# Patient Record
Sex: Female | Born: 1945
Health system: Southern US, Community
[De-identification: ages and names within clinical notes are randomized; demographics above are authoritative.]

## PROBLEM LIST (undated history)

## (undated) DIAGNOSIS — M272 Inflammatory conditions of jaws: Secondary | ICD-10-CM

## (undated) DIAGNOSIS — C4491 Basal cell carcinoma of skin, unspecified: Secondary | ICD-10-CM

## (undated) DIAGNOSIS — F329 Major depressive disorder, single episode, unspecified: Secondary | ICD-10-CM

## (undated) DIAGNOSIS — M549 Dorsalgia, unspecified: Secondary | ICD-10-CM

## (undated) DIAGNOSIS — J90 Pleural effusion, not elsewhere classified: Secondary | ICD-10-CM

## (undated) DIAGNOSIS — F32A Depression, unspecified: Secondary | ICD-10-CM

## (undated) DIAGNOSIS — R9389 Abnormal findings on diagnostic imaging of other specified body structures: Secondary | ICD-10-CM

## (undated) DIAGNOSIS — G8929 Other chronic pain: Secondary | ICD-10-CM

## (undated) DIAGNOSIS — H353290 Exudative age-related macular degeneration, unspecified eye, stage unspecified: Secondary | ICD-10-CM

## (undated) DIAGNOSIS — Z9289 Personal history of other medical treatment: Secondary | ICD-10-CM

## (undated) DIAGNOSIS — G894 Chronic pain syndrome: Secondary | ICD-10-CM

## (undated) DIAGNOSIS — K274 Chronic or unspecified peptic ulcer, site unspecified, with hemorrhage: Secondary | ICD-10-CM

## (undated) DIAGNOSIS — K284 Chronic or unspecified gastrojejunal ulcer with hemorrhage: Secondary | ICD-10-CM

## (undated) DIAGNOSIS — H543 Unqualified visual loss, both eyes: Secondary | ICD-10-CM

## (undated) DIAGNOSIS — K559 Vascular disorder of intestine, unspecified: Secondary | ICD-10-CM

## (undated) DIAGNOSIS — K631 Perforation of intestine (nontraumatic): Secondary | ICD-10-CM

## (undated) DIAGNOSIS — F419 Anxiety disorder, unspecified: Secondary | ICD-10-CM

## (undated) DIAGNOSIS — R000 Tachycardia, unspecified: Secondary | ICD-10-CM

## (undated) DIAGNOSIS — J019 Acute sinusitis, unspecified: Secondary | ICD-10-CM

## (undated) DIAGNOSIS — L659 Nonscarring hair loss, unspecified: Secondary | ICD-10-CM

## (undated) DIAGNOSIS — IMO0002 Reserved for concepts with insufficient information to code with codable children: Secondary | ICD-10-CM

## (undated) DIAGNOSIS — K551 Chronic vascular disorders of intestine: Secondary | ICD-10-CM

## (undated) DIAGNOSIS — D649 Anemia, unspecified: Secondary | ICD-10-CM

## (undated) HISTORY — DX: Inflammatory conditions of jaws: M27.2

## (undated) HISTORY — DX: Chronic vascular disorders of intestine: K55.1

## (undated) HISTORY — DX: Chronic pain syndrome: G89.4

## (undated) HISTORY — PX: VAGINAL HYSTERECTOMY: SUR661

## (undated) HISTORY — PX: FACIAL RECONSTRUCTION SURGERY: SHX631

## (undated) HISTORY — PX: CATARACT EXTRACTION W/ INTRAOCULAR LENS  IMPLANT, BILATERAL: SHX1307

## (undated) HISTORY — DX: Depression, unspecified: F32.A

## (undated) HISTORY — PX: OVARIAN CYST SURGERY: SHX726

## (undated) HISTORY — DX: Major depressive disorder, single episode, unspecified: F32.9

## (undated) HISTORY — PX: MOHS SURGERY: SUR867

## (undated) HISTORY — DX: Reserved for concepts with insufficient information to code with codable children: IMO0002

## (undated) HISTORY — PX: BOWEL RESECTION: SHX1257

---
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
## 1997-08-11 ENCOUNTER — Emergency Department (HOSPITAL_COMMUNITY): Admission: EM | Admit: 1997-08-11 | Discharge: 1997-08-11 | Payer: Self-pay | Admitting: Family Medicine

## 1997-11-28 ENCOUNTER — Encounter: Admission: RE | Admit: 1997-11-28 | Discharge: 1998-02-26 | Payer: Self-pay | Admitting: Family Medicine

## 1998-02-26 ENCOUNTER — Encounter: Admission: RE | Admit: 1998-02-26 | Discharge: 1998-05-27 | Payer: Self-pay | Admitting: Family Medicine

## 1998-11-05 ENCOUNTER — Encounter: Payer: Self-pay | Admitting: Emergency Medicine

## 1998-11-05 ENCOUNTER — Emergency Department (HOSPITAL_COMMUNITY): Admission: EM | Admit: 1998-11-05 | Discharge: 1998-11-05 | Payer: Self-pay | Admitting: Emergency Medicine

## 2001-08-08 ENCOUNTER — Emergency Department (HOSPITAL_COMMUNITY): Admission: EM | Admit: 2001-08-08 | Discharge: 2001-08-08 | Payer: Self-pay | Admitting: Emergency Medicine

## 2002-02-06 ENCOUNTER — Encounter: Payer: Self-pay | Admitting: Emergency Medicine

## 2002-02-06 ENCOUNTER — Emergency Department (HOSPITAL_COMMUNITY): Admission: EM | Admit: 2002-02-06 | Discharge: 2002-02-06 | Payer: Self-pay | Admitting: Emergency Medicine

## 2002-10-07 ENCOUNTER — Emergency Department (HOSPITAL_COMMUNITY): Admission: EM | Admit: 2002-10-07 | Discharge: 2002-10-07 | Payer: Self-pay | Admitting: Emergency Medicine

## 2002-10-07 ENCOUNTER — Encounter: Payer: Self-pay | Admitting: Emergency Medicine

## 2003-02-07 ENCOUNTER — Emergency Department (HOSPITAL_COMMUNITY): Admission: EM | Admit: 2003-02-07 | Discharge: 2003-02-07 | Payer: Self-pay | Admitting: Emergency Medicine

## 2003-03-28 ENCOUNTER — Encounter: Admission: RE | Admit: 2003-03-28 | Discharge: 2003-04-09 | Payer: Self-pay | Admitting: Orthopedic Surgery

## 2004-02-23 ENCOUNTER — Emergency Department (HOSPITAL_COMMUNITY): Admission: EM | Admit: 2004-02-23 | Discharge: 2004-02-24 | Payer: Self-pay | Admitting: *Deleted

## 2004-07-23 ENCOUNTER — Emergency Department (HOSPITAL_COMMUNITY): Admission: EM | Admit: 2004-07-23 | Discharge: 2004-07-23 | Payer: Self-pay | Admitting: Emergency Medicine

## 2006-03-22 ENCOUNTER — Emergency Department (HOSPITAL_COMMUNITY): Admission: EM | Admit: 2006-03-22 | Discharge: 2006-03-22 | Payer: Self-pay | Admitting: Emergency Medicine

## 2006-04-06 ENCOUNTER — Emergency Department (HOSPITAL_COMMUNITY): Admission: EM | Admit: 2006-04-06 | Discharge: 2006-04-06 | Payer: Self-pay | Admitting: Emergency Medicine

## 2006-10-11 ENCOUNTER — Ambulatory Visit: Payer: Self-pay | Admitting: Infectious Diseases

## 2006-10-11 ENCOUNTER — Inpatient Hospital Stay (HOSPITAL_COMMUNITY): Admission: EM | Admit: 2006-10-11 | Discharge: 2006-10-15 | Payer: Self-pay | Admitting: Emergency Medicine

## 2006-10-29 ENCOUNTER — Telehealth: Payer: Self-pay | Admitting: *Deleted

## 2006-11-03 ENCOUNTER — Encounter (INDEPENDENT_AMBULATORY_CARE_PROVIDER_SITE_OTHER): Payer: Self-pay | Admitting: Internal Medicine

## 2006-11-03 ENCOUNTER — Ambulatory Visit: Payer: Self-pay | Admitting: Internal Medicine

## 2006-11-03 DIAGNOSIS — E538 Deficiency of other specified B group vitamins: Secondary | ICD-10-CM

## 2006-11-03 LAB — CONVERTED CEMR LAB
HCT: 37.3 % (ref 36.0–46.0)
Hemoglobin: 12.3 g/dL (ref 12.0–15.0)
Iron: 32 ug/dL — ABNORMAL LOW (ref 42–145)
MCHC: 32.8 g/dL (ref 30.0–36.0)
MCV: 89.7 fL (ref 78.0–100.0)
Platelets: 320 10*3/uL (ref 150–400)
RBC: 4.16 M/uL (ref 3.87–5.11)
RDW: 15.3 % — ABNORMAL HIGH (ref 11.5–14.0)
Saturation Ratios: 10 % — ABNORMAL LOW (ref 20–55)
TIBC: 316 ug/dL (ref 250–470)
UIBC: 284 ug/dL
WBC: 5.9 10*3/uL (ref 4.0–10.5)

## 2006-11-27 DIAGNOSIS — K551 Chronic vascular disorders of intestine: Secondary | ICD-10-CM

## 2006-11-27 DIAGNOSIS — M272 Inflammatory conditions of jaws: Secondary | ICD-10-CM

## 2006-11-27 HISTORY — DX: Chronic vascular disorders of intestine: K55.1

## 2006-11-27 HISTORY — DX: Inflammatory conditions of jaws: M27.2

## 2006-12-02 ENCOUNTER — Ambulatory Visit: Payer: Self-pay | Admitting: Internal Medicine

## 2006-12-02 ENCOUNTER — Encounter (INDEPENDENT_AMBULATORY_CARE_PROVIDER_SITE_OTHER): Payer: Self-pay | Admitting: Internal Medicine

## 2006-12-02 DIAGNOSIS — Z87891 Personal history of nicotine dependence: Secondary | ICD-10-CM | POA: Insufficient documentation

## 2006-12-02 LAB — CONVERTED CEMR LAB
ALT: 11 units/L (ref 0–35)
AST: 20 units/L (ref 0–37)
Albumin: 3.7 g/dL (ref 3.5–5.2)
Alkaline Phosphatase: 91 units/L (ref 39–117)
BUN: 12 mg/dL (ref 6–23)
CO2: 25 meq/L (ref 19–32)
Calcium: 9.6 mg/dL (ref 8.4–10.5)
Chloride: 101 meq/L (ref 96–112)
Creatinine, Ser: 0.66 mg/dL (ref 0.40–1.20)
Glucose, Bld: 93 mg/dL (ref 70–99)
HCT: 42.9 % (ref 36.0–46.0)
Hemoglobin: 14.1 g/dL (ref 12.0–15.0)
MCHC: 32.9 g/dL (ref 30.0–36.0)
MCV: 86.3 fL (ref 78.0–100.0)
Platelets: 279 10*3/uL (ref 150–400)
Potassium: 4.2 meq/L (ref 3.5–5.3)
RBC: 4.97 M/uL (ref 3.87–5.11)
RDW: 15.1 % — ABNORMAL HIGH (ref 11.5–14.0)
Sodium: 138 meq/L (ref 135–145)
Total Bilirubin: 0.4 mg/dL (ref 0.3–1.2)
Total Protein: 6.2 g/dL (ref 6.0–8.3)
WBC: 5.1 10*3/uL (ref 4.0–10.5)

## 2006-12-06 ENCOUNTER — Emergency Department (HOSPITAL_COMMUNITY): Admission: EM | Admit: 2006-12-06 | Discharge: 2006-12-07 | Payer: Self-pay | Admitting: Emergency Medicine

## 2006-12-09 ENCOUNTER — Inpatient Hospital Stay (HOSPITAL_COMMUNITY): Admission: AD | Admit: 2006-12-09 | Discharge: 2006-12-21 | Payer: Self-pay | Admitting: Infectious Disease

## 2006-12-09 ENCOUNTER — Ambulatory Visit: Payer: Self-pay | Admitting: Infectious Disease

## 2006-12-09 ENCOUNTER — Ambulatory Visit: Payer: Self-pay | Admitting: Internal Medicine

## 2006-12-27 ENCOUNTER — Encounter (INDEPENDENT_AMBULATORY_CARE_PROVIDER_SITE_OTHER): Payer: Self-pay | Admitting: Internal Medicine

## 2006-12-28 ENCOUNTER — Ambulatory Visit: Payer: Self-pay | Admitting: Internal Medicine

## 2006-12-28 DIAGNOSIS — K260 Acute duodenal ulcer with hemorrhage: Secondary | ICD-10-CM

## 2006-12-29 ENCOUNTER — Telehealth (INDEPENDENT_AMBULATORY_CARE_PROVIDER_SITE_OTHER): Payer: Self-pay | Admitting: Internal Medicine

## 2006-12-31 ENCOUNTER — Ambulatory Visit: Payer: Self-pay | Admitting: Hospitalist

## 2007-01-27 HISTORY — PX: INCISION AND DRAINAGE ABSCESS: SHX5864

## 2007-02-01 ENCOUNTER — Ambulatory Visit: Payer: Self-pay | Admitting: Infectious Disease

## 2007-02-01 ENCOUNTER — Encounter (INDEPENDENT_AMBULATORY_CARE_PROVIDER_SITE_OTHER): Payer: Self-pay | Admitting: Internal Medicine

## 2007-02-01 LAB — CONVERTED CEMR LAB
Bilirubin Urine: NEGATIVE
Hemoglobin, Urine: NEGATIVE
Ketones, ur: NEGATIVE mg/dL
Leukocytes, UA: NEGATIVE
Urine Glucose: NEGATIVE mg/dL
pH: 5.5 (ref 5.0–8.0)

## 2007-02-02 ENCOUNTER — Encounter (INDEPENDENT_AMBULATORY_CARE_PROVIDER_SITE_OTHER): Payer: Self-pay | Admitting: Internal Medicine

## 2007-02-08 ENCOUNTER — Encounter (INDEPENDENT_AMBULATORY_CARE_PROVIDER_SITE_OTHER): Payer: Self-pay | Admitting: Infectious Diseases

## 2007-02-08 ENCOUNTER — Ambulatory Visit: Payer: Self-pay | Admitting: Internal Medicine

## 2007-02-08 ENCOUNTER — Telehealth: Payer: Self-pay | Admitting: *Deleted

## 2007-02-08 LAB — CONVERTED CEMR LAB
Bilirubin Urine: NEGATIVE
Ketones, ur: NEGATIVE mg/dL
Nitrite: NEGATIVE
Specific Gravity, Urine: 1.014 (ref 1.005–1.03)
Urine Glucose: NEGATIVE mg/dL
pH: 5 (ref 5.0–8.0)

## 2007-02-11 ENCOUNTER — Encounter (INDEPENDENT_AMBULATORY_CARE_PROVIDER_SITE_OTHER): Payer: Self-pay | Admitting: Internal Medicine

## 2007-02-18 ENCOUNTER — Encounter (INDEPENDENT_AMBULATORY_CARE_PROVIDER_SITE_OTHER): Payer: Self-pay | Admitting: Internal Medicine

## 2007-02-22 ENCOUNTER — Telehealth: Payer: Self-pay | Admitting: *Deleted

## 2007-02-22 ENCOUNTER — Telehealth (INDEPENDENT_AMBULATORY_CARE_PROVIDER_SITE_OTHER): Payer: Self-pay | Admitting: Infectious Diseases

## 2007-02-24 ENCOUNTER — Ambulatory Visit (HOSPITAL_COMMUNITY): Admission: RE | Admit: 2007-02-24 | Discharge: 2007-02-24 | Payer: Self-pay | Admitting: Obstetrics & Gynecology

## 2007-02-24 ENCOUNTER — Encounter (INDEPENDENT_AMBULATORY_CARE_PROVIDER_SITE_OTHER): Payer: Self-pay | Admitting: Internal Medicine

## 2007-03-03 ENCOUNTER — Encounter (INDEPENDENT_AMBULATORY_CARE_PROVIDER_SITE_OTHER): Payer: Self-pay | Admitting: Internal Medicine

## 2007-03-03 ENCOUNTER — Telehealth: Payer: Self-pay | Admitting: *Deleted

## 2007-03-03 DIAGNOSIS — M810 Age-related osteoporosis without current pathological fracture: Secondary | ICD-10-CM

## 2007-03-30 ENCOUNTER — Telehealth: Payer: Self-pay | Admitting: *Deleted

## 2007-03-31 ENCOUNTER — Encounter (INDEPENDENT_AMBULATORY_CARE_PROVIDER_SITE_OTHER): Payer: Self-pay | Admitting: *Deleted

## 2007-03-31 ENCOUNTER — Ambulatory Visit: Payer: Self-pay | Admitting: Infectious Diseases

## 2007-03-31 LAB — CONVERTED CEMR LAB
Glucose, Bld: 99 mg/dL (ref 70–99)
Potassium: 4.2 meq/L (ref 3.5–5.3)
Sodium: 141 meq/L (ref 135–145)

## 2007-04-01 ENCOUNTER — Ambulatory Visit (HOSPITAL_COMMUNITY): Admission: RE | Admit: 2007-04-01 | Discharge: 2007-04-01 | Payer: Self-pay | Admitting: Hospitalist

## 2007-04-05 ENCOUNTER — Encounter (INDEPENDENT_AMBULATORY_CARE_PROVIDER_SITE_OTHER): Payer: Self-pay | Admitting: Internal Medicine

## 2007-04-12 ENCOUNTER — Ambulatory Visit: Payer: Self-pay | Admitting: Dentistry

## 2007-04-12 ENCOUNTER — Encounter: Admission: EM | Admit: 2007-04-12 | Discharge: 2007-04-12 | Payer: Self-pay | Admitting: Dentistry

## 2007-04-13 ENCOUNTER — Ambulatory Visit: Payer: Self-pay | Admitting: Hospitalist

## 2007-04-15 ENCOUNTER — Encounter (HOSPITAL_COMMUNITY): Payer: Self-pay | Admitting: Dentistry

## 2007-04-15 ENCOUNTER — Ambulatory Visit (HOSPITAL_COMMUNITY): Admission: RE | Admit: 2007-04-15 | Discharge: 2007-04-15 | Payer: Self-pay | Admitting: Dentistry

## 2007-04-15 ENCOUNTER — Ambulatory Visit: Payer: Self-pay | Admitting: Hospitalist

## 2007-05-09 ENCOUNTER — Encounter (INDEPENDENT_AMBULATORY_CARE_PROVIDER_SITE_OTHER): Payer: Self-pay | Admitting: Internal Medicine

## 2007-05-17 ENCOUNTER — Encounter (INDEPENDENT_AMBULATORY_CARE_PROVIDER_SITE_OTHER): Payer: Self-pay | Admitting: Internal Medicine

## 2007-05-19 ENCOUNTER — Encounter (INDEPENDENT_AMBULATORY_CARE_PROVIDER_SITE_OTHER): Payer: Self-pay | Admitting: Internal Medicine

## 2007-05-19 ENCOUNTER — Ambulatory Visit: Payer: Self-pay | Admitting: Internal Medicine

## 2007-05-19 LAB — CONVERTED CEMR LAB
Eosinophils Absolute: 0.1 10*3/uL (ref 0.0–0.7)
Eosinophils Relative: 1 % (ref 0–5)
HCT: 47.2 % — ABNORMAL HIGH (ref 36.0–46.0)
Lymphs Abs: 2.5 10*3/uL (ref 0.7–4.0)
MCV: 91.5 fL (ref 78.0–100.0)
Monocytes Relative: 6 % (ref 3–12)
Platelets: 208 10*3/uL (ref 150–400)
WBC: 8.9 10*3/uL (ref 4.0–10.5)

## 2007-05-20 ENCOUNTER — Encounter (HOSPITAL_COMMUNITY): Admission: RE | Admit: 2007-05-20 | Discharge: 2007-08-18 | Payer: Self-pay | Admitting: Infectious Disease

## 2007-05-30 ENCOUNTER — Ambulatory Visit: Payer: Self-pay | Admitting: Dentistry

## 2007-05-31 ENCOUNTER — Encounter (INDEPENDENT_AMBULATORY_CARE_PROVIDER_SITE_OTHER): Payer: Self-pay | Admitting: Internal Medicine

## 2007-05-31 ENCOUNTER — Ambulatory Visit (HOSPITAL_COMMUNITY): Admission: RE | Admit: 2007-05-31 | Discharge: 2007-05-31 | Payer: Self-pay | Admitting: Internal Medicine

## 2007-07-04 ENCOUNTER — Ambulatory Visit (HOSPITAL_COMMUNITY): Admission: RE | Admit: 2007-07-04 | Discharge: 2007-07-04 | Payer: Self-pay | Admitting: *Deleted

## 2007-07-04 ENCOUNTER — Ambulatory Visit: Payer: Self-pay | Admitting: Internal Medicine

## 2007-07-05 ENCOUNTER — Encounter (INDEPENDENT_AMBULATORY_CARE_PROVIDER_SITE_OTHER): Payer: Self-pay | Admitting: Internal Medicine

## 2007-07-13 ENCOUNTER — Ambulatory Visit: Payer: Self-pay | Admitting: Internal Medicine

## 2007-07-26 ENCOUNTER — Ambulatory Visit: Payer: Self-pay | Admitting: Internal Medicine

## 2007-07-28 ENCOUNTER — Encounter (INDEPENDENT_AMBULATORY_CARE_PROVIDER_SITE_OTHER): Payer: Self-pay | Admitting: *Deleted

## 2007-07-28 ENCOUNTER — Ambulatory Visit: Payer: Self-pay | Admitting: *Deleted

## 2007-07-28 ENCOUNTER — Ambulatory Visit (HOSPITAL_COMMUNITY): Admission: RE | Admit: 2007-07-28 | Discharge: 2007-07-28 | Payer: Self-pay | Admitting: Internal Medicine

## 2007-07-28 LAB — CONVERTED CEMR LAB
BUN: 9 mg/dL (ref 6–23)
Calcium: 9.1 mg/dL (ref 8.4–10.5)
Creatinine, Ser: 0.81 mg/dL (ref 0.40–1.20)
Glucose, Bld: 100 mg/dL — ABNORMAL HIGH (ref 70–99)

## 2007-08-10 ENCOUNTER — Ambulatory Visit: Payer: Self-pay | Admitting: Infectious Diseases

## 2007-08-11 ENCOUNTER — Ambulatory Visit: Payer: Self-pay | Admitting: Infectious Diseases

## 2007-08-11 LAB — CONVERTED CEMR LAB: TSH: 1.904 microintl units/mL (ref 0.350–4.50)

## 2007-08-15 ENCOUNTER — Encounter: Admission: RE | Admit: 2007-08-15 | Discharge: 2007-08-15 | Payer: Self-pay | Admitting: Gastroenterology

## 2007-08-15 ENCOUNTER — Encounter (INDEPENDENT_AMBULATORY_CARE_PROVIDER_SITE_OTHER): Payer: Self-pay | Admitting: Internal Medicine

## 2007-08-23 ENCOUNTER — Ambulatory Visit: Payer: Self-pay | Admitting: Dentistry

## 2007-09-14 ENCOUNTER — Telehealth (INDEPENDENT_AMBULATORY_CARE_PROVIDER_SITE_OTHER): Payer: Self-pay | Admitting: Internal Medicine

## 2007-10-14 ENCOUNTER — Telehealth (INDEPENDENT_AMBULATORY_CARE_PROVIDER_SITE_OTHER): Payer: Self-pay | Admitting: Internal Medicine

## 2007-10-14 ENCOUNTER — Emergency Department (HOSPITAL_COMMUNITY): Admission: EM | Admit: 2007-10-14 | Discharge: 2007-10-14 | Payer: Self-pay | Admitting: Family Medicine

## 2007-10-26 ENCOUNTER — Ambulatory Visit: Payer: Self-pay | Admitting: *Deleted

## 2007-10-26 DIAGNOSIS — M25559 Pain in unspecified hip: Secondary | ICD-10-CM

## 2007-10-29 ENCOUNTER — Inpatient Hospital Stay (HOSPITAL_COMMUNITY): Admission: EM | Admit: 2007-10-29 | Discharge: 2007-11-03 | Payer: Self-pay | Admitting: Emergency Medicine

## 2007-10-30 ENCOUNTER — Encounter (INDEPENDENT_AMBULATORY_CARE_PROVIDER_SITE_OTHER): Payer: Self-pay | Admitting: Gastroenterology

## 2007-10-31 ENCOUNTER — Encounter (INDEPENDENT_AMBULATORY_CARE_PROVIDER_SITE_OTHER): Payer: Self-pay | Admitting: Internal Medicine

## 2007-11-14 ENCOUNTER — Encounter: Payer: Self-pay | Admitting: Infectious Diseases

## 2007-11-15 ENCOUNTER — Ambulatory Visit: Payer: Self-pay | Admitting: Infectious Diseases

## 2007-11-15 LAB — CONVERTED CEMR LAB
ALT: 9 units/L (ref 0–35)
AST: 14 units/L (ref 0–37)
Albumin: 4.2 g/dL (ref 3.5–5.2)
Calcium: 9.7 mg/dL (ref 8.4–10.5)
Chloride: 101 meq/L (ref 96–112)
Platelets: 442 10*3/uL — ABNORMAL HIGH (ref 150–400)
Potassium: 5.2 meq/L (ref 3.5–5.3)
RDW: 14.5 % (ref 11.5–15.5)

## 2007-11-28 ENCOUNTER — Telehealth (INDEPENDENT_AMBULATORY_CARE_PROVIDER_SITE_OTHER): Payer: Self-pay | Admitting: Internal Medicine

## 2007-11-30 ENCOUNTER — Ambulatory Visit: Payer: Self-pay | Admitting: Dentistry

## 2007-12-01 ENCOUNTER — Ambulatory Visit: Payer: Self-pay | Admitting: Internal Medicine

## 2007-12-01 DIAGNOSIS — M26629 Arthralgia of temporomandibular joint, unspecified side: Secondary | ICD-10-CM

## 2007-12-26 ENCOUNTER — Encounter (INDEPENDENT_AMBULATORY_CARE_PROVIDER_SITE_OTHER): Payer: Self-pay | Admitting: Internal Medicine

## 2007-12-29 ENCOUNTER — Ambulatory Visit: Payer: Self-pay | Admitting: Infectious Diseases

## 2008-01-24 ENCOUNTER — Encounter (INDEPENDENT_AMBULATORY_CARE_PROVIDER_SITE_OTHER): Payer: Self-pay | Admitting: Internal Medicine

## 2008-01-31 ENCOUNTER — Emergency Department (HOSPITAL_COMMUNITY): Admission: EM | Admit: 2008-01-31 | Discharge: 2008-01-31 | Payer: Self-pay | Admitting: Emergency Medicine

## 2008-01-31 ENCOUNTER — Telehealth: Payer: Self-pay | Admitting: *Deleted

## 2008-02-03 ENCOUNTER — Other Ambulatory Visit: Admission: RE | Admit: 2008-02-03 | Discharge: 2008-02-03 | Payer: Self-pay | Admitting: Infectious Disease

## 2008-02-03 ENCOUNTER — Encounter (INDEPENDENT_AMBULATORY_CARE_PROVIDER_SITE_OTHER): Payer: Self-pay | Admitting: Internal Medicine

## 2008-02-03 ENCOUNTER — Ambulatory Visit: Payer: Self-pay | Admitting: Infectious Disease

## 2008-02-06 ENCOUNTER — Encounter (INDEPENDENT_AMBULATORY_CARE_PROVIDER_SITE_OTHER): Payer: Self-pay | Admitting: Internal Medicine

## 2008-03-08 ENCOUNTER — Encounter (INDEPENDENT_AMBULATORY_CARE_PROVIDER_SITE_OTHER): Payer: Self-pay | Admitting: *Deleted

## 2008-03-08 ENCOUNTER — Ambulatory Visit: Payer: Self-pay | Admitting: Internal Medicine

## 2008-03-08 ENCOUNTER — Encounter (INDEPENDENT_AMBULATORY_CARE_PROVIDER_SITE_OTHER): Payer: Self-pay | Admitting: Internal Medicine

## 2008-03-08 DIAGNOSIS — F329 Major depressive disorder, single episode, unspecified: Secondary | ICD-10-CM | POA: Insufficient documentation

## 2008-03-08 LAB — CONVERTED CEMR LAB
ALT: 10 units/L (ref 0–35)
AST: 16 units/L (ref 0–37)
Basophils Absolute: 0 10*3/uL (ref 0.0–0.1)
Basophils Relative: 0 % (ref 0–1)
CO2: 22 meq/L (ref 19–32)
Eosinophils Relative: 1 % (ref 0–5)
HCT: 47.6 % — ABNORMAL HIGH (ref 36.0–46.0)
Lymphocytes Relative: 34 % (ref 12–46)
Neutro Abs: 4.1 10*3/uL (ref 1.7–7.7)
Platelets: 262 10*3/uL (ref 150–400)
RDW: 14.9 % (ref 11.5–15.5)
Sodium: 144 meq/L (ref 135–145)
TSH: 1.221 microintl units/mL (ref 0.350–4.50)
Total Bilirubin: 0.4 mg/dL (ref 0.3–1.2)
Total Protein: 7.1 g/dL (ref 6.0–8.3)

## 2008-04-03 ENCOUNTER — Telehealth (INDEPENDENT_AMBULATORY_CARE_PROVIDER_SITE_OTHER): Payer: Self-pay | Admitting: Internal Medicine

## 2008-04-05 ENCOUNTER — Ambulatory Visit: Payer: Self-pay | Admitting: Internal Medicine

## 2008-04-13 ENCOUNTER — Ambulatory Visit: Payer: Self-pay | Admitting: Internal Medicine

## 2008-05-08 ENCOUNTER — Telehealth (INDEPENDENT_AMBULATORY_CARE_PROVIDER_SITE_OTHER): Payer: Self-pay | Admitting: Internal Medicine

## 2008-05-11 ENCOUNTER — Encounter (INDEPENDENT_AMBULATORY_CARE_PROVIDER_SITE_OTHER): Payer: Self-pay | Admitting: Internal Medicine

## 2008-05-11 ENCOUNTER — Ambulatory Visit: Payer: Self-pay | Admitting: Internal Medicine

## 2008-05-11 LAB — CONVERTED CEMR LAB
Amphetamine Screen, Ur: NEGATIVE
Cocaine Metabolites: NEGATIVE
Marijuana Metabolite: NEGATIVE
Opiates: POSITIVE — AB
Phencyclidine (PCP): NEGATIVE

## 2008-05-25 ENCOUNTER — Encounter: Admission: RE | Admit: 2008-05-25 | Discharge: 2008-05-25 | Payer: Self-pay | Admitting: Gastroenterology

## 2008-05-28 ENCOUNTER — Ambulatory Visit: Payer: Self-pay | Admitting: Internal Medicine

## 2008-06-04 ENCOUNTER — Emergency Department (HOSPITAL_COMMUNITY): Admission: EM | Admit: 2008-06-04 | Discharge: 2008-06-05 | Payer: Self-pay | Admitting: Emergency Medicine

## 2008-06-05 ENCOUNTER — Telehealth (INDEPENDENT_AMBULATORY_CARE_PROVIDER_SITE_OTHER): Payer: Self-pay | Admitting: *Deleted

## 2008-06-07 ENCOUNTER — Telehealth: Payer: Self-pay | Admitting: *Deleted

## 2008-06-08 ENCOUNTER — Inpatient Hospital Stay (HOSPITAL_COMMUNITY): Admission: RE | Admit: 2008-06-08 | Discharge: 2008-06-10 | Payer: Self-pay | Admitting: Orthopedic Surgery

## 2008-06-14 ENCOUNTER — Encounter: Admission: RE | Admit: 2008-06-14 | Discharge: 2008-07-20 | Payer: Self-pay | Admitting: Orthopedic Surgery

## 2008-07-17 ENCOUNTER — Ambulatory Visit: Payer: Self-pay | Admitting: Dentistry

## 2008-07-25 ENCOUNTER — Encounter: Admission: RE | Admit: 2008-07-25 | Discharge: 2008-10-23 | Payer: Self-pay | Admitting: Orthopedic Surgery

## 2008-07-26 ENCOUNTER — Encounter: Payer: Self-pay | Admitting: Internal Medicine

## 2008-07-26 ENCOUNTER — Telehealth: Payer: Self-pay | Admitting: *Deleted

## 2008-07-26 ENCOUNTER — Ambulatory Visit: Payer: Self-pay | Admitting: Internal Medicine

## 2008-07-26 LAB — CONVERTED CEMR LAB
Albumin: 4.8 g/dL (ref 3.5–5.2)
BUN: 8 mg/dL (ref 6–23)
Basophils Absolute: 0 10*3/uL (ref 0.0–0.1)
CO2: 22 meq/L (ref 19–32)
CRP: 0.4 mg/dL (ref <0.6)
Calcium: 9.7 mg/dL (ref 8.4–10.5)
Chloride: 106 meq/L (ref 96–112)
Creatinine, Ser: 0.77 mg/dL (ref 0.40–1.20)
Eosinophils Relative: 1 % (ref 0–5)
Glucose, Bld: 85 mg/dL (ref 70–99)
HCT: 48.3 % — ABNORMAL HIGH (ref 36.0–46.0)
Hemoglobin: 16.4 g/dL — ABNORMAL HIGH (ref 12.0–15.0)
Lymphocytes Relative: 41 % (ref 12–46)
Lymphs Abs: 2.5 10*3/uL (ref 0.7–4.0)
Monocytes Absolute: 0.3 10*3/uL (ref 0.1–1.0)
Neutro Abs: 3.1 10*3/uL (ref 1.7–7.7)
Potassium: 4.2 meq/L (ref 3.5–5.3)
Sed Rate: 5 mm/hr (ref 0–22)
WBC: 5.9 10*3/uL (ref 4.0–10.5)

## 2008-07-27 ENCOUNTER — Telehealth: Payer: Self-pay | Admitting: Internal Medicine

## 2008-07-27 ENCOUNTER — Ambulatory Visit (HOSPITAL_COMMUNITY): Admission: RE | Admit: 2008-07-27 | Discharge: 2008-07-27 | Payer: Self-pay | Admitting: Internal Medicine

## 2008-08-04 ENCOUNTER — Ambulatory Visit (HOSPITAL_COMMUNITY): Admission: RE | Admit: 2008-08-04 | Discharge: 2008-08-04 | Payer: Self-pay | Admitting: Orthopedic Surgery

## 2008-08-10 ENCOUNTER — Ambulatory Visit: Payer: Self-pay | Admitting: Infectious Diseases

## 2008-08-10 ENCOUNTER — Encounter: Payer: Self-pay | Admitting: Internal Medicine

## 2008-08-10 DIAGNOSIS — S42309A Unspecified fracture of shaft of humerus, unspecified arm, initial encounter for closed fracture: Secondary | ICD-10-CM

## 2008-08-14 LAB — CONVERTED CEMR LAB: Sed Rate: 17 mm/hr (ref 0–22)

## 2008-08-16 ENCOUNTER — Ambulatory Visit: Payer: Self-pay | Admitting: Infectious Diseases

## 2008-08-30 ENCOUNTER — Telehealth (INDEPENDENT_AMBULATORY_CARE_PROVIDER_SITE_OTHER): Payer: Self-pay | Admitting: Internal Medicine

## 2008-09-03 ENCOUNTER — Emergency Department (HOSPITAL_COMMUNITY): Admission: EM | Admit: 2008-09-03 | Discharge: 2008-09-03 | Payer: Self-pay | Admitting: Emergency Medicine

## 2008-09-05 ENCOUNTER — Ambulatory Visit (HOSPITAL_BASED_OUTPATIENT_CLINIC_OR_DEPARTMENT_OTHER): Admission: RE | Admit: 2008-09-05 | Discharge: 2008-09-05 | Payer: Self-pay | Admitting: Orthopedic Surgery

## 2008-09-10 ENCOUNTER — Ambulatory Visit: Payer: Self-pay | Admitting: Internal Medicine

## 2008-09-21 ENCOUNTER — Telehealth (INDEPENDENT_AMBULATORY_CARE_PROVIDER_SITE_OTHER): Payer: Self-pay | Admitting: Internal Medicine

## 2008-10-02 ENCOUNTER — Telehealth (INDEPENDENT_AMBULATORY_CARE_PROVIDER_SITE_OTHER): Payer: Self-pay | Admitting: Internal Medicine

## 2008-10-03 ENCOUNTER — Encounter: Payer: Self-pay | Admitting: Infectious Diseases

## 2008-10-04 ENCOUNTER — Telehealth: Payer: Self-pay | Admitting: *Deleted

## 2008-10-25 ENCOUNTER — Telehealth: Payer: Self-pay | Admitting: *Deleted

## 2008-10-25 ENCOUNTER — Ambulatory Visit: Payer: Self-pay | Admitting: Internal Medicine

## 2008-10-25 ENCOUNTER — Encounter (INDEPENDENT_AMBULATORY_CARE_PROVIDER_SITE_OTHER): Payer: Self-pay | Admitting: Internal Medicine

## 2008-10-26 ENCOUNTER — Encounter (INDEPENDENT_AMBULATORY_CARE_PROVIDER_SITE_OTHER): Payer: Self-pay | Admitting: Internal Medicine

## 2008-10-26 LAB — CONVERTED CEMR LAB
Cholesterol: 307 mg/dL — ABNORMAL HIGH (ref 0–200)
LDL Cholesterol: 205 mg/dL — ABNORMAL HIGH (ref 0–99)
Triglycerides: 132 mg/dL (ref <150)
VLDL: 26 mg/dL (ref 0–40)

## 2008-10-29 ENCOUNTER — Encounter (INDEPENDENT_AMBULATORY_CARE_PROVIDER_SITE_OTHER): Payer: Self-pay | Admitting: Internal Medicine

## 2008-10-30 ENCOUNTER — Telehealth (INDEPENDENT_AMBULATORY_CARE_PROVIDER_SITE_OTHER): Payer: Self-pay | Admitting: Internal Medicine

## 2008-11-02 ENCOUNTER — Telehealth (INDEPENDENT_AMBULATORY_CARE_PROVIDER_SITE_OTHER): Payer: Self-pay | Admitting: Internal Medicine

## 2008-11-05 ENCOUNTER — Telehealth (INDEPENDENT_AMBULATORY_CARE_PROVIDER_SITE_OTHER): Payer: Self-pay | Admitting: Internal Medicine

## 2008-11-06 ENCOUNTER — Encounter (INDEPENDENT_AMBULATORY_CARE_PROVIDER_SITE_OTHER): Payer: Self-pay | Admitting: Internal Medicine

## 2008-11-21 ENCOUNTER — Encounter (INDEPENDENT_AMBULATORY_CARE_PROVIDER_SITE_OTHER): Payer: Self-pay | Admitting: Internal Medicine

## 2008-11-21 ENCOUNTER — Telehealth (INDEPENDENT_AMBULATORY_CARE_PROVIDER_SITE_OTHER): Payer: Self-pay | Admitting: Internal Medicine

## 2008-12-04 ENCOUNTER — Ambulatory Visit: Payer: Self-pay | Admitting: Infectious Diseases

## 2008-12-27 ENCOUNTER — Telehealth (INDEPENDENT_AMBULATORY_CARE_PROVIDER_SITE_OTHER): Payer: Self-pay | Admitting: Internal Medicine

## 2008-12-27 ENCOUNTER — Encounter (INDEPENDENT_AMBULATORY_CARE_PROVIDER_SITE_OTHER): Payer: Self-pay | Admitting: Internal Medicine

## 2009-01-03 ENCOUNTER — Telehealth: Payer: Self-pay | Admitting: Infectious Diseases

## 2009-01-03 ENCOUNTER — Ambulatory Visit: Payer: Self-pay | Admitting: Infectious Diseases

## 2009-01-03 DIAGNOSIS — Z79891 Long term (current) use of opiate analgesic: Secondary | ICD-10-CM

## 2009-01-24 ENCOUNTER — Ambulatory Visit: Payer: Self-pay | Admitting: Internal Medicine

## 2009-01-24 DIAGNOSIS — E785 Hyperlipidemia, unspecified: Secondary | ICD-10-CM | POA: Insufficient documentation

## 2009-02-20 ENCOUNTER — Encounter
Admission: RE | Admit: 2009-02-20 | Discharge: 2009-03-08 | Payer: Self-pay | Admitting: Physical Medicine & Rehabilitation

## 2009-02-22 ENCOUNTER — Ambulatory Visit: Payer: Self-pay | Admitting: Physical Medicine & Rehabilitation

## 2009-02-25 ENCOUNTER — Ambulatory Visit: Payer: Self-pay | Admitting: Internal Medicine

## 2009-02-25 LAB — CONVERTED CEMR LAB
ALT: 15 units/L (ref 0–35)
AST: 22 units/L (ref 0–37)
Albumin: 4.1 g/dL (ref 3.5–5.2)
CO2: 24 meq/L (ref 19–32)
Calcium: 8.8 mg/dL (ref 8.4–10.5)
Chloride: 106 meq/L (ref 96–112)
Creatinine, Ser: 0.81 mg/dL (ref 0.40–1.20)
Potassium: 4.4 meq/L (ref 3.5–5.3)

## 2009-02-26 ENCOUNTER — Encounter (INDEPENDENT_AMBULATORY_CARE_PROVIDER_SITE_OTHER): Payer: Self-pay | Admitting: Internal Medicine

## 2009-03-04 ENCOUNTER — Encounter: Admission: AD | Admit: 2009-03-04 | Discharge: 2009-03-04 | Payer: Self-pay | Admitting: Dentistry

## 2009-03-08 ENCOUNTER — Ambulatory Visit: Payer: Self-pay | Admitting: Physical Medicine & Rehabilitation

## 2009-03-18 ENCOUNTER — Ambulatory Visit: Payer: Self-pay | Admitting: Dentistry

## 2009-03-21 ENCOUNTER — Ambulatory Visit: Payer: Self-pay | Admitting: Internal Medicine

## 2009-03-21 ENCOUNTER — Encounter: Admission: RE | Admit: 2009-03-21 | Discharge: 2009-03-21 | Payer: Self-pay | Admitting: Gastroenterology

## 2009-03-22 ENCOUNTER — Telehealth: Payer: Self-pay | Admitting: *Deleted

## 2009-03-29 ENCOUNTER — Ambulatory Visit: Payer: Self-pay | Admitting: Internal Medicine

## 2009-03-29 DIAGNOSIS — B029 Zoster without complications: Secondary | ICD-10-CM | POA: Insufficient documentation

## 2009-04-01 LAB — CONVERTED CEMR LAB
Cholesterol: 240 mg/dL — ABNORMAL HIGH (ref 0–200)
Free T4: 0.92 ng/dL (ref 0.80–1.80)
TSH: 2.876 microintl units/mL (ref 0.350–4.5)

## 2009-04-05 ENCOUNTER — Ambulatory Visit: Payer: Self-pay | Admitting: Infectious Diseases

## 2009-05-06 ENCOUNTER — Telehealth (INDEPENDENT_AMBULATORY_CARE_PROVIDER_SITE_OTHER): Payer: Self-pay | Admitting: Internal Medicine

## 2009-05-07 ENCOUNTER — Encounter (INDEPENDENT_AMBULATORY_CARE_PROVIDER_SITE_OTHER): Payer: Self-pay | Admitting: Internal Medicine

## 2009-06-10 ENCOUNTER — Telehealth: Payer: Self-pay | Admitting: *Deleted

## 2009-06-13 ENCOUNTER — Encounter (INDEPENDENT_AMBULATORY_CARE_PROVIDER_SITE_OTHER): Payer: Self-pay | Admitting: Internal Medicine

## 2009-06-18 ENCOUNTER — Encounter: Admission: RE | Admit: 2009-06-18 | Discharge: 2009-06-18 | Payer: Self-pay | Admitting: Internal Medicine

## 2009-07-15 ENCOUNTER — Telehealth (INDEPENDENT_AMBULATORY_CARE_PROVIDER_SITE_OTHER): Payer: Self-pay | Admitting: Internal Medicine

## 2009-07-15 ENCOUNTER — Encounter (INDEPENDENT_AMBULATORY_CARE_PROVIDER_SITE_OTHER): Payer: Self-pay | Admitting: Internal Medicine

## 2009-08-14 ENCOUNTER — Telehealth (INDEPENDENT_AMBULATORY_CARE_PROVIDER_SITE_OTHER): Payer: Self-pay | Admitting: *Deleted

## 2009-08-14 ENCOUNTER — Encounter: Payer: Self-pay | Admitting: Internal Medicine

## 2009-09-11 ENCOUNTER — Ambulatory Visit: Payer: Self-pay | Admitting: Internal Medicine

## 2009-09-25 ENCOUNTER — Telehealth: Payer: Self-pay | Admitting: Ophthalmology

## 2009-10-11 ENCOUNTER — Telehealth (INDEPENDENT_AMBULATORY_CARE_PROVIDER_SITE_OTHER): Payer: Self-pay | Admitting: *Deleted

## 2009-10-14 ENCOUNTER — Encounter: Payer: Self-pay | Admitting: Internal Medicine

## 2009-10-14 ENCOUNTER — Telehealth: Payer: Self-pay | Admitting: *Deleted

## 2009-11-11 ENCOUNTER — Telehealth: Payer: Self-pay | Admitting: Internal Medicine

## 2009-11-12 ENCOUNTER — Encounter: Payer: Self-pay | Admitting: Internal Medicine

## 2009-11-12 ENCOUNTER — Telehealth (INDEPENDENT_AMBULATORY_CARE_PROVIDER_SITE_OTHER): Payer: Self-pay | Admitting: *Deleted

## 2009-11-18 ENCOUNTER — Encounter: Payer: Self-pay | Admitting: Ophthalmology

## 2009-11-28 ENCOUNTER — Telehealth (INDEPENDENT_AMBULATORY_CARE_PROVIDER_SITE_OTHER): Payer: Self-pay | Admitting: *Deleted

## 2009-11-28 ENCOUNTER — Ambulatory Visit: Payer: Self-pay | Admitting: Internal Medicine

## 2009-12-23 ENCOUNTER — Ambulatory Visit: Payer: Self-pay | Admitting: Internal Medicine

## 2010-01-03 ENCOUNTER — Emergency Department (HOSPITAL_COMMUNITY)
Admission: EM | Admit: 2010-01-03 | Discharge: 2010-01-03 | Payer: Self-pay | Source: Home / Self Care | Admitting: Emergency Medicine

## 2010-01-08 ENCOUNTER — Ambulatory Visit: Payer: Self-pay | Admitting: Internal Medicine

## 2010-01-29 ENCOUNTER — Ambulatory Visit: Admission: RE | Admit: 2010-01-29 | Discharge: 2010-01-29 | Payer: Self-pay | Source: Home / Self Care

## 2010-02-07 ENCOUNTER — Ambulatory Visit (HOSPITAL_COMMUNITY)
Admission: RE | Admit: 2010-02-07 | Discharge: 2010-02-07 | Payer: Self-pay | Source: Home / Self Care | Attending: Internal Medicine | Admitting: Internal Medicine

## 2010-02-13 ENCOUNTER — Telehealth: Payer: Self-pay | Admitting: Ophthalmology

## 2010-02-17 ENCOUNTER — Encounter: Payer: Self-pay | Admitting: Internal Medicine

## 2010-02-18 ENCOUNTER — Ambulatory Visit: Admission: RE | Admit: 2010-02-18 | Discharge: 2010-02-18 | Payer: Self-pay | Source: Home / Self Care

## 2010-02-18 LAB — CONVERTED CEMR LAB
Barbiturate Quant, Ur: NEGATIVE
Cocaine Metabolites: NEGATIVE
Creatinine,U: 88.9 mg/dL
Methadone: NEGATIVE
Opiates: POSITIVE — AB

## 2010-02-20 ENCOUNTER — Ambulatory Visit (HOSPITAL_COMMUNITY)
Admission: RE | Admit: 2010-02-20 | Discharge: 2010-02-20 | Payer: Self-pay | Source: Home / Self Care | Attending: Internal Medicine | Admitting: Internal Medicine

## 2010-02-27 NOTE — Progress Notes (Signed)
Summary: med refill/gp  Phone Note Refill Request Message from:  Fax from Pharmacy on October 11, 2009 10:45 AM  Refills Requested: Medication #1:  PROZAC 40 MG CAPS Take 1.5 tablet by mouth once a day   Last Refilled: 09/11/2009 Last appt. 09/11/09.   Method Requested: Electronic Initial call taken by: Chinita Pester RN,  October 11, 2009 10:46 AM    Prescriptions: PROZAC 40 MG CAPS (FLUOXETINE HCL) Take 1.5 tablet by mouth once a day  #45 x 3   Entered and Authorized by:   Zoila Shutter MD   Signed by:   Zoila Shutter MD on 10/11/2009   Method used:   Electronically to        [facility] [address] [phone]* (retail)       [address]       [address]       [place], [place]  [address]       Ph: [phone]       Fax: [phone]   RxID:   [id number]

## 2010-02-27 NOTE — Assessment & Plan Note (Signed)
Summary: 1WK F/U/POKHAREL/VS   Vital Signs:  Patient profile:   65 year old female Height:      64.4 inches (163.58 cm) Weight:      165.4 pounds (75.18 kg) BMI:     28.14 Temp:     98.6 degrees F oral Pulse rate:   102 / minute BP sitting:   121 / 70  (left arm)  Vitals Entered By: Chinita Pester RN (April 05, 2009 10:51 AM)  CC: F/U visit- for rash on right arm. Is Patient Diabetic? No Pain Assessment Patient in pain? yes     Location: right arm Intensity: 7 Type: stinging Onset of pain  Intermittent Nutritional Status BMI of 25 - 29 = overweight  Have you ever been in a relationship where you felt threatened, hurt or afraid?No   Does patient need assistance? Functional Status Self care Ambulation Normal   Primary Care Provider:  Jason Coop MD  CC:  F/U visit- for rash on right arm.Marland Kitchen  History of Present Illness: Patient is 65 year old female current smoker with PMH as described in EMR is here today for follow up of rash.. It started 4 weeks ago, first at the arm and progressed to forearms. It was associated with pain, she was prescribed lidocaine which helped some but then started irritating. She was here last week for increased pain, redness and swelling of her arm. We started her on doxycycline during that visit.  She comes in today with her arm healing well. Swelling, pain and redness almost gone now. She still has some scattered rashes which are not painful. She also has noticed 3-4 lesions on her face but they are not painful. She complains of Flu like symptoms going on for 2 weeks now with yellow colour sputum. No fever or chills.  Depression History:      The patient denies a depressed mood most of the day and a diminished interest in her usual daily activities.         Preventive Screening-Counseling & Management  Alcohol-Tobacco     Alcohol drinks/day: 0     Smoking Status: current     Smoking Cessation Counseling: yes     Packs/Day: 0.5  Year Quit: 2009, 1 month  Caffeine-Diet-Exercise     Caffeine use/day: 0     Does Patient Exercise: yes     Type of exercise: WALKING THE DOG     Times/week: 7  Problems Prior to Update: 1)  Herpes Zoster  (ICD-053.9) 2)  Oth&uns Sup Injr Oth Mx&uns Site w/o Mention Inf  (ICD-919.8) 3)  Hyperlipidemia  (ICD-272.4) 4)  Chronic Pain Syndrome  (ICD-338.4) 5)  Visual Acuity, Decreased, Left Eye  (ICD-369.9) 6)  Fracture, Arm, Right  (ICD-818.0) 7)  Cellulitis and Abscess of Face  (ICD-682.0) 8)  Fatigue  (ICD-780.79) 9)  Depression  (ICD-311) 10)  Toe Pain  (ICD-729.5) 11)  Carcinoma, Basal Cell, Cheek, Left  (ICD-173.3) 12)  External Hemorrhoids  (ICD-455.3) 13)  Temporomandibular Joint Pain  (ICD-524.62) 14)  Hip Pain  (ICD-719.45) 15)  Back Pain  (ICD-724.5) 16)  Compression Fracture, Thoracic Vertebra  (ICD-805.2) 17)  Low Blood Pressure  (ICD-458.9) 18)  Osteoporosis  (ICD-733.00) 19)  Health Maintenance Exam  (ICD-V70.0) 20)  Acute Osteomyelitis Other Specified Site  (ICD-730.08) 21)  Tobacco Abuse  (ICD-305.1) 22)  Vitamin B12 Deficiency  (ICD-266.2) 23)  Acute Duodenal Ulcer W/hemorrhage&obstruction  (ICD-532.01) 24)  Anemia  (ICD-285.9)  Medications Prior to Update: 1)  Protonix 40 Mg  Tbec (Pantoprazole Sodium) .... Take 1 Tablet By Mouth Two Times A Day 2)  Cobal-1000 1000 Mcg/ml Inj Soln (Cyanocobalamin) .... Inject Once Daily Once A Month 3)  Bd Luer-Lok Syringe 21g X 1-1/2" 3 Ml  Misc (Syringe/needle (Disp)) 4)  Oscal 500/200 D-3 500-200 Mg-Unit  Tabs (Calcium-Vitamin D) .... Take 3 Tablets A Day. 5)  Miralax  Pack (Polyethylene Glycol 3350) .... Take 1 Pack Once A Day. 6)  Prozac 40 Mg Caps (Fluoxetine Hcl) .... Take 1 Tablet By Mouth Once A Day 7)  Percocet 5-325 Mg Tabs (Oxycodone-Acetaminophen) .... Ta Ke 1 Tablet By Mouth Every 8 Hours As Needed For Pain. 8)  Pravachol 20 Mg Tabs (Pravastatin Sodium) .... Take 1 Tablet By Mouth Once A Day 9)  Neurontin 400 Mg  Caps (Gabapentin) .... Take 1 Pill By Mouth Three Times A Day. 10)  Nicoderm Cq 21 Mg/24hr Pt24 (Nicotine) .... Upply 1 Patch Locally Daily. 11)  Bentyl 20 Mg Tabs (Dicyclomine Hcl) .... Take 1 Tablet By Mouth Three To Four Times A Day, Per Dr. Ewing Schlein. 12)  Amitriptyline Hcl 50 Mg Tabs (Amitriptyline Hcl) .... Once Daily For 5 Days and Then Increase It To 2 Pills A Day From Day 6 Onwards. 13)  Doxepin Hcl 100 Mg Caps (Doxepin Hcl) .... Take 1 Tablet By Mouth Two Times A Day  Current Medications (verified): 1)  Protonix 40 Mg  Tbec (Pantoprazole Sodium) .... Take 1 Tablet By Mouth Two Times A Day 2)  Cobal-1000 1000 Mcg/ml Inj Soln (Cyanocobalamin) .... Inject Once Daily Once A Month 3)  Bd Luer-Lok Syringe 21g X 1-1/2" 3 Ml  Misc (Syringe/needle (Disp)) 4)  Oscal 500/200 D-3 500-200 Mg-Unit  Tabs (Calcium-Vitamin D) .... Take 3 Tablets A Day. 5)  Miralax  Pack (Polyethylene Glycol 3350) .... Take 1 Pack Once A Day. 6)  Prozac 40 Mg Caps (Fluoxetine Hcl) .... Take 1 Tablet By Mouth Once A Day 7)  Percocet 5-325 Mg Tabs (Oxycodone-Acetaminophen) .... Ta Ke 1 Tablet By Mouth Every 8 Hours As Needed For Pain. 8)  Pravachol 40 Mg Tabs (Pravastatin Sodium) .... Take 1 Tablet By Mouth Once A Day At Bedtime 9)  Neurontin 400 Mg Caps (Gabapentin) .... Take 1 Pill By Mouth Three Times A Day. 10)  Nicoderm Cq 21 Mg/24hr Pt24 (Nicotine) .... Upply 1 Patch Locally Daily. 11)  Bentyl 20 Mg Tabs (Dicyclomine Hcl) .... Take 1 Tablet By Mouth Three To Four Times A Day, Per Dr. Ewing Schlein. 12)  Amitriptyline Hcl 50 Mg Tabs (Amitriptyline Hcl) .... Once Daily For 5 Days and Then Increase It To 2 Pills A Day From Day 6 Onwards.  Allergies (verified): 1)  ! Nsaids 2)  ! 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 Ewing Schlein    - f/u by gen. surg.    - expecting her to require bowel resection (i.e. when the sx  recur) DUODENAL ULCER with hemorrhage and obstruction 11/2006 MANDIBULAR OSTEOMYELITIS with recurrent chin abscess    - started 11/2006 while in hospital. Gram showed GNR and cx was negative.    - cx negative x 2 additional times in opc    - s/p debridement by Dr. Neoma Laming    - Grew S. Bovis - s/p 4 weeks of Pen V started 05/19/07 and additional 2 weeks started 07/04/07 DEGENERATIVE DISC DISEASE L5-S1 CHRONIC INTERSCAPULAR PAIN    - Mild compression of  T12 superior endplate with a Schmorl's node - doc'd on CT scan 03/22/06 Depression  Family History: Last updated: 11-06-07 Father: died from an MI at 48yo, DM, Mother died of lung cancer Sister- DM  Social History: Last updated: 2007/11/06 Now lives by herself in an ALF where she can come and go as she pleases and where she can keep her dog, Ree Kida Criselda Peaches). Her good friend [name] is still looking out for her on a daily basis. Smokes about 4 cigarettes a week.   No EtOH   Risk Factors: Alcohol Use: 0 (04/05/2009) Caffeine Use: 0 (04/05/2009) Exercise: yes (04/05/2009)  Risk Factors: Smoking Status: current (04/05/2009) Packs/Day: 4 cigs/week (02/08/2007)  Social History: Packs/Day:  4 cigs/week  Review of Systems      See HPI  Physical Exam  Additional Exam:  Gen: AOx3, in no acute distress, few lesions on her face(4-5) Eyes: PERRL, EOMI ENT:MMM, No erythema noted in posterior pharynx Neck: No JVD, No LAP Chest: CTAB with  good respiratory effort CVS: regular rhythmic rate, NO M/R/G, S1 S2 normal Abdo: soft,ND, BS+x4, Non tender and No hepatosplenomegaly EXT: No odema noted, multiple maculopapular lesions arm in healing stage. Neuro: Non focal, gait is normal Skin: no rashes noted.    Impression & Recommendations:  Problem # 1:  HERPES ZOSTER (ICD-053.9) Assessment Improved Much improved. Plan to have her back as needed. The lesions seemed to be either healed compl;etely or just about to heal.  Problem # 2:   HYPERLIPIDEMIA (ICD-272.4) Assessment: Deteriorated  Change her dose to 40mg  as her chelestereol needs to be controlled better and we have scope to go up on pravachol as tolerated. Her updated medication list for this problem includes:    Pravachol 40 Mg Tabs (Pravastatin sodium) .Marland Kitchen... Take 1 tablet by mouth once a day at bedtime  Labs Reviewed: SGOT: 22 (02/25/2009)   SGPT: 15 (02/25/2009)   HDL:64 (03/29/2009), 76 (10/26/2008)  LDL:152 (03/29/2009), 205 (10/26/2008)  Chol:240 (03/29/2009), 307 (10/26/2008)  Trig:121 (03/29/2009), 132 (10/26/2008)  Problem # 3:  HEALTH MAINTENANCE EXAM (ICD-V70.0) Assessment: Comment Only Mammogram is scheduled and asked nursing to get the Colonoscopy report. Up to date on all her vaccinations.  Problem # 4:  URI (ICD-465.9) Assessment: New  Instructed on symptomatic and OTC treatment along with steam inhalation and saline gargles at home. Call if symptoms persist or worsen.   Problem # 5:  TOBACCO ABUSE (ICD-305.1) Assessment: Comment Only Patient agreed to fill her prescription today. Her updated medication list for this problem includes:    Nicoderm Cq 21 Mg/24hr Pt24 (Nicotine) ..... Upply 1 patch locally daily.  Encouraged smoking cessation and discussed different methods for smoking cessation. She agrees to fill her prescription today.  Complete Medication List: 1)  Protonix 40 Mg Tbec (Pantoprazole sodium) .... Take 1 tablet by mouth two times a day 2)  Cobal-1000 1000 Mcg/ml Inj Soln (Cyanocobalamin) .... Inject once daily once a month 3)  Bd Luer-lok Syringe 21g X 1-1/2" 3 Ml Misc (Syringe/needle (disp)) 4)  Oscal 500/200 D-3 500-200 Mg-unit Tabs (Calcium-vitamin d) .... Take 3 tablets a day. 5)  Miralax Pack (Polyethylene glycol 3350) .... Take 1 pack once a day. 6)  Prozac 40 Mg Caps (Fluoxetine hcl) .... Take 1 tablet by mouth once a day 7)  Percocet 5-325 Mg Tabs (Oxycodone-acetaminophen) .... Ta ke 1 tablet by mouth every 8 hours as  needed for pain. 8)  Pravachol 40 Mg Tabs (Pravastatin sodium) .... Take 1 tablet by mouth  once a day at bedtime 9)  Neurontin 400 Mg Caps (Gabapentin) .... Take 1 pill by mouth three times a day. 10)  Nicoderm Cq 21 Mg/24hr Pt24 (Nicotine) .... Upply 1 patch locally daily. 11)  Bentyl 20 Mg Tabs (Dicyclomine hcl) .... Take 1 tablet by mouth three to four times a day, per dr. Ewing Schlein. 12)  Amitriptyline Hcl 50 Mg Tabs (Amitriptyline hcl) .... Once daily for 5 days and then increase it to 2 pills a day from day 6 onwards.  Other Orders: Mammogram (Screening) (Mammo)  Patient Instructions: 1)  Please schedule a follow-up appointment as needed. 2)  Please schedule an appointment with your primary doctor . 3)  Tobacco is very bad for your health and your loved ones! You Should stop smoking!. 4)  Stop Smoking Tips: Choose a Quit date. Cut down before the Quit date. decide what you will do as a substitute when you feel the urge to smoke(gum,toothpick,exercise). 5)  It is important that you exercise regularly at least 20 minutes 5 times a week. If you develop chest pain, have severe difficulty breathing, or feel very tired , stop exercising immediately and seek medical attention. 6)  Schedule your mammogram. 7)  Check your Blood Pressure regularly. If it is above: 140/90 you should make an appointment. 8)  Get plenty of rest, drink lots of clear liquids, and use Tylenol or Ibuprofen for fever and comfort. Return in 7-10 days if you're not better:sooner if you're feeling worse. 9)  Take over the counter cough and cold medications only as directed on the package insert. DO NOT take more than recommended . Prescriptions: PRAVACHOL 40 MG TABS (PRAVASTATIN SODIUM) Take 1 tablet by mouth once a day at bedtime  #30 x 2   Entered and Authorized by:   Lars Mage MD   Signed by:   Lars Mage MD on 04/05/2009   Method used:   Electronically to        [facility] [address] [phone]* (retail)       [address]       [address]       [place], [place]  [address]       Ph: [phone]       Fax: [phone]   RxID:   [id number]   Prevention & Chronic Care Immunizations   Influenza vaccine: Fluvax MCR  (01/24/2009)   Influenza vaccine deferral: Deferred  (07/26/2008)   Influenza vaccine due: 09/26/2008    Tetanus booster: Not documented   Td booster deferral: Refused  (04/05/2009)    Pneumococcal vaccine: Not documented   Pneumococcal vaccine deferral: Not indicated  (03/29/2009)    H. zoster vaccine: Not documented   H. zoster vaccine deferral: Deferred  (07/26/2008)  Colorectal Screening   Hemoccult: Not documented   Hemoccult action/deferral: Not indicated  (04/05/2009)   Hemoccult due: 07/26/2009    Colonoscopy: Not documented   Colonoscopy action/deferral: Deferred  (04/05/2009)  Other Screening   Pap smear: Not documented   Pap smear action/deferral: Not indicated S/P hysterectomy  (09/10/2008)    Mammogram: Not documented   Mammogram action/deferral: Deferred  (04/05/2009)    DXA bone density scan: Not documented   DXA bone density action/deferral: Deferred  (04/05/2009)  Reports requested:   Last colonoscopy report requested.  Smoking status: current  (04/05/2009)   Smoking cessation counseling: yes  (04/05/2009)  Lipids   Total Cholesterol: 240  (03/29/2009)   Lipid panel action/deferral: Lipid Panel ordered   LDL: 152  (  03/29/2009)   LDL Direct: Not documented   HDL: 64  (03/29/2009)   Triglycerides: 121  (03/29/2009)    SGOT (AST): 22  (02/25/2009)   SGPT (ALT): 15  (02/25/2009)   Alkaline phosphatase: 100  (02/25/2009)   Total bilirubin: 0.3  (02/25/2009)    Lipid flowsheet reviewed?: Yes   Progress toward LDL goal: Improved  Self-Management Support :   Personal Goals (by the next clinic visit) :      Personal LDL goal: 100  (03/29/2009)    Patient will work on the following items until the next clinic visit to reach self-care  goals:     Medications and monitoring: take my medicines every day, bring all of my medications to every visit  (04/05/2009)     Eating: eat more vegetables, eat foods that are low in salt, eat baked foods instead of fried foods  (03/29/2009)     Activity: join a walking program  (03/29/2009)    Lipid self-management support: Education handout, Written self-care plan  (04/05/2009)   Lipid self-care plan printed.   Lipid education handout printed   Nursing Instructions: Request report of last colonoscopy Schedule screening mammogram (see order)    Colonoscopy report to be scanned.   Chinita Pester RN  April 05, 2009 12:03 PM

## 2010-02-27 NOTE — Progress Notes (Signed)
Summary: refill/ hla  Phone Note Refill Request Message from:  Patient on October 14, 2009 11:48 AM  Refills Requested: Medication #1:  PERCOCET 5-325 MG TABS Ta ke 1 tablet by mouth every 8 hours as needed for pain.   Dosage confirmed as above?Dosage Confirmed   Last Refilled: 8/17 last visit 8/17, labs 1 yr ago  Initial call taken by: Marin Roberts RN,  October 14, 2009 11:48 AM    Prescriptions: PERCOCET 5-325 MG TABS (OXYCODONE-ACETAMINOPHEN) Ta ke 1 tablet by mouth every 8 hours as needed for pain.  #60 x 0   Entered and Authorized by:   Zoila Shutter MD   Signed by:   Zoila Shutter MD on 10/14/2009   Method used:   Print then Give to Patient   RxID:   [id number]

## 2010-02-27 NOTE — Assessment & Plan Note (Signed)
Summary: EST-CK/FU/MEDS/CFB   Vital Signs:  Patient profile:   65 year old female Height:      64.4 inches (163.58 cm) Weight:      160.0 pounds (75.18 kg) BMI:     28.14 Temp:     98.4 degrees F (36.89 degrees C) oral Pulse rate:   98 / minute BP sitting:   130 / 72  (left arm) Cuff size:   regular  Vitals Entered By: Theotis Barrio NT II (September 11, 2009 2:59 PM) CC: MEDICATION REFILL    /  PAIN BACK - HIPS- KNEE # 6  /   DEPRESSED  - Is Patient Diabetic? No Pain Assessment Patient in pain? yes      Nutritional Status BMI of 25 - 29 = overweight  Have you ever been in a relationship where you felt threatened, hurt or afraid?No   Does patient need assistance? Functional Status Self care Ambulation Normal   Primary Care Provider:  Jason Coop MD  CC:  MEDICATION REFILL    /  PAIN BACK - HIPS- KNEE # 6  /   DEPRESSED  -.  History of Present Illness:  This is a 65 year old female with a history of depression, superior mesenteric artery ischemia 2008, and lumbar degenerative joint disease who present for routine follow up.  The patient's primary concern today is her depression which she states has been worses since January.  The patient has been sleeping 18 hours daily, and as had increased oral intake and anhedonia, though she denies any guilt, SI or HI. The patient was recently seen in March for shingles in her right arm which is continuing to improve but she continues to have post herpetic neuralgia symptoms and has to sleep with a stuffed animal under her arm.  The patient is also having problems with her back and hip pain.  She feels both are progressive and 6/10 in intensity.  The hip pain as been particularly bad over the last 6 months. We have no dexa scan here but she states that her bones were "chalk" on the last one.   The patient also fell about 1 month ago and landed on her knees and hit her chest into a book case.  She has continued to have pain in her right  breast since that time but denies any new lumps.  The patient also has a history of basal cell carcinoma on her left cheek which has recurred but she has not seen a dermatologist.           Preventive Screening-Counseling & Management  Alcohol-Tobacco     Alcohol drinks/day: 0     Smoking Status: current     Smoking Cessation Counseling: yes     Packs/Day: 0.5     Year Started: 1-2 cigs per month SOMETIME     Year Quit: 2009, 1 month  Caffeine-Diet-Exercise     Caffeine use/day: 0     Does Patient Exercise: yes     Type of exercise: WALKING THE DOG     Times/week: 7  Allergies: 1)  ! Nsaids 2)  ! 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 Ewing Schlein    - f/u by gen. surg.    - expecting her to require bowel resection (i.e. when the sx recur) DUODENAL ULCER with hemorrhage and obstruction 11/2006 MANDIBULAR OSTEOMYELITIS with recurrent chin abscess    -  started 11/2006 while in hospital. Gram showed GNR and cx was negative.    - cx negative x 2 additional times in opc    - s/p debridement by Dr. Neoma Laming    - Grew S. Bovis - s/p 4 weeks of Pen V started 05/19/07 and additional 2 weeks started 07/04/07 DEGENERATIVE DISC DISEASE L5-S1 CHRONIC INTERSCAPULAR PAIN    - Mild compression of T12 superior endplate with a Schmorl's node - doc'd on CT scan 03/22/06 Depression  Family History: Last updated: 2007/11/21 Father: died from an MI at 71yo, DM, Mother died of lung cancer Sister- DM  Risk Factors: Alcohol Use: 0 (09/11/2009) Caffeine Use: 0 (09/11/2009) Exercise: yes (09/11/2009)  Social History: Now lives by herself in an ALF ([facility]) where she can come and go as she pleases. a Her good friend [name] is still looking out for her on a daily basis. Smokes about 1.5 packs daily.   No EtOH   Review of Systems       No fever, weight loss, no night sweats, no  change in Bm, no blood or dark stool.  All other systems reviewed and were negative.   Physical Exam  General:  alert, well-developed, and well-nourished.   Head:  normocephalic, atraumatic, and there is a 1 cm nodule on the left cheek with a pearly appearance and central scab.  Eyes:  vision grossly intact, pupils equal, pupils round, and pupils reactive to light.   Ears:  R ear normal and L ear normal.   Nose:  no external deformity and no nasal discharge.   Mouth:  TMJ malallignment on bite. Patient has no teeth.  no gingival abnormalities.   Neck:  supple and full ROM.   Chest Wall:  no deformities.  no echymosis Breasts:  Deferred.  Lungs:  normal respiratory effort, normal breath sounds, no crackles, and no wheezes.   Heart:  normal rate, regular rhythm, no murmur, no gallop, and no rub.   Abdomen:  diffuse tenderness, normal bowel sounds, no distention, no masses, no guarding, and no rigidity.   Rectal:  Deferred Genitalia:  Deferred Msk:  Bicep stregth 4/5 on right due to pain in right shoulder.  Shoulder is tender to palpation posteriorly with decreased range of movment.  Pulses:  2+ radial, dp/pt pulses bilaterally.  Extremities:  NO edema.  Neurologic:  alert & oriented X3 and cranial nerves II-XII intact.   Skin:  Apparent recurrence of BCC on left cheek.  Resolving macular rash of the right arm from recent zoster infection.  Cervical Nodes:  NO cervical lymphadamopathy.  Psych:  Oriented X3, memory intact for recent and remote, normally interactive, and good eye contact.     Impression & Recommendations:  Problem # 1:  HERPES ZOSTER (ICD-053.9) Resolving with secondary post herpetic neuralgia.  Patient's Neurontin has been increased to 600mg  three times a day.  Problem # 2:  DEPRESSION (ICD-311)  Increase prozac to 60 mg daily from 40 mg daily.  Will call in two weeks to monitor for improvement and reevaluate in the office at 1 month.   The following medications were  removed from the medication list:    Amitriptyline Hcl 50 Mg Tabs (Amitriptyline hcl) ..... Once daily for 5 days and then increase it to 2 pills a day from day 6 onwards. Her updated medication list for this problem includes:    Prozac 40 Mg Caps (Fluoxetine hcl) .Marland Kitchen... Take 1.5 tablet by mouth once a day  Problem # 3:  CARCINOMA, BASAL CELL, CHEEK, LEFT (ICD-173.3) Dermatology referral was made to further evaluate.  Would consider excisional biopsy given hx.    Problem # 4:  HIP PAIN (ICD-719.45)  Will continue percocet at the current dose.  Will reassess pain at next visit.  Increased pain may be associated with worsening depression symptoms.    Her updated medication list for this problem includes:    Percocet 5-325 Mg Tabs (Oxycodone-acetaminophen) .Marland Kitchen... Ta ke 1 tablet by mouth every 8 hours as needed for pain.  Problem # 7:  Preventive Health Care (ICD-V70.0) Will perform  bimanual exam at next visit. Patient recieved her TDAP today.   Problem # 8:  Hx of CONTUSION OF BREAST (ICD-922.0) Will do breast exam at next visit. Carcinoma is unlikely given recent mammogram.  Pain is likely MSK.   Complete Medication List: 1)  Cobal-1000 1000 Mcg/ml Inj Soln (Cyanocobalamin) .... Inject once daily once a month 2)  Bd Luer-lok Syringe 21g X 1-1/2" 3 Ml Misc (Syringe/needle (disp)) 3)  Oscal 500/200 D-3 500-200 Mg-unit Tabs (Calcium-vitamin d) .... Take 3 tablets a day. 4)  Prozac 40 Mg Caps (Fluoxetine hcl) .... Take 1.5 tablet by mouth once a day 5)  Percocet 5-325 Mg Tabs (Oxycodone-acetaminophen) .... Ta ke 1 tablet by mouth every 8 hours as needed for pain. 6)  Pravachol 40 Mg Tabs (Pravastatin sodium) .... Take 1 tablet by mouth once a day at bedtime 7)  Neurontin 600 Mg Tabs (Gabapentin) .... Take 1 pill by mouth three times daily. 8)  Nexium 40 Mg Cpdr (Esomeprazole magnesium) .... Take one tab daily 9)  Cvs Stool Softener 100 Mg Caps (Docusate sodium) .... Once a day as needed for  constipation  Other Orders: Dermatology Referral (Derma) Tdap => 84yrs IM ([address]) State-TD Vaccine 7 yrs. & > IM (60454U) Admin 1st Vaccine (98119) Admin of Any Addtl Vaccine (14782) Admin 1st Vaccine (State) [phone]) Admin of Any Addtl Vaccine (State) [phone])  Patient Instructions: 1)  If depression worsens with increased medication dose, or you develop thoughts of suicide, please go to the ER or contact your doctor.  Prescriptions: PERCOCET 5-325 MG TABS (OXYCODONE-ACETAMINOPHEN) Ta ke 1 tablet by mouth every 8 hours as needed for pain.  #60 x 0   Entered and Authorized by:   Sinda Du MD   Signed by:   Sinda Du MD on 09/11/2009   Method used:   Print then Give to Patient   RxID:   [id number] PROZAC 40 MG CAPS (FLUOXETINE HCL) Take 1.5 tablet by mouth once a day  #45 x 0   Entered and Authorized by:   Sinda Du MD   Signed by:   Sinda Du MD on 09/11/2009   Method used:   Electronically to        [facility] [address] [phone]* (retail)       [address]       [address]       [place], [place]  [address]       Ph: [phone]       Fax: [phone]   RxID:   [id number] NEURONTIN 600 MG TABS (GABAPENTIN) Take 1 pill by mouth three times daily.  #90 x 1   Entered and Authorized by:   Sinda Du MD   Signed by:   Sinda Du MD on 09/11/2009   Method used:   Electronically to        [facility] [address] [phone]* (retail)  [address]       [address]       [place], [place]  [address]       Ph: [phone]       Fax: [phone]   RxID:   [id number]    Prevention & Chronic Care Immunizations   Influenza vaccine: Fluvax MCR  (01/24/2009)   Influenza vaccine deferral: Deferred  (07/26/2008)   Influenza vaccine due: 09/26/2008    Tetanus booster: 09/11/2009: Tdap (State)   Td booster deferral: Refused  (04/05/2009)    Pneumococcal vaccine: Not documented   Pneumococcal vaccine deferral: Not indicated  (03/29/2009)     H. zoster vaccine: Not documented   H. zoster vaccine deferral: Deferred  (07/26/2008)  Colorectal Screening   Hemoccult: Not documented   Hemoccult action/deferral: Not indicated  (04/05/2009)   Hemoccult due: 07/26/2009    Colonoscopy: Not documented   Colonoscopy action/deferral: Deferred  (04/05/2009)  Other Screening   Pap smear: Not documented   Pap smear action/deferral: Not indicated S/P hysterectomy  (09/10/2008)    Mammogram: ASSESSMENT: Negative - BI-RADS 1^MM DIGITAL SCREENING  (06/18/2009)   Mammogram action/deferral: Deferred  (04/05/2009)    DXA bone density scan: Not documented   DXA bone density action/deferral: Deferred  (04/05/2009)   Smoking status: current  (09/11/2009)   Smoking cessation counseling: yes  (09/11/2009)  Lipids   Total Cholesterol: 240  (03/29/2009)   Lipid panel action/deferral: Lipid Panel ordered   LDL: 152  (03/29/2009)   LDL Direct: Not documented   HDL: 64  (03/29/2009)   Triglycerides: 121  (03/29/2009)    SGOT (AST): 22  (02/25/2009)   SGPT (ALT): 15  (02/25/2009)   Alkaline phosphatase: 100  (02/25/2009)   Total bilirubin: 0.3  (02/25/2009)  Self-Management Support :   Personal Goals (by the next clinic visit) :      Personal LDL goal: 100  (03/29/2009)    Patient will work on the following items until the next clinic visit to reach self-care goals:     Medications and monitoring: take my medicines every day, weigh myself weekly  (09/11/2009)     Eating: eat foods that are low in salt, eat baked foods instead of fried foods, limit or avoid alcohol  (09/11/2009)     Activity: join a walking program  (09/11/2009)    Lipid self-management support: Education handout, Written self-care plan  (04/05/2009)     Tetanus/Td Vaccine    Vaccine Type: Tdap (State)    Site: left deltoid    Mfr: GlaxoSmithKline    Dose: 0.5 ml    Route: IM    Given by: Merrie Roof RN    Exp. Date: 04/20/2011    Lot #: [id number]    VIS  given: 12/14/06 version given September 11, 2009.   Appended Document: EST-CK/FU/MEDS/CFB Of note, although, the patient's depression symptoms have been worsening since January, at the visit today she reports the recent demise of her dog.  The patient had an extremly close relationship with her dog and this may be exacerbating her recent depression symptoms.

## 2010-02-27 NOTE — Consult Note (Signed)
Summary: [facility]  [facility]   Imported By: Louretta Parma 11/29/2009 15:34:45  _____________________________________________________________________  External Attachment:    Type:   Image     Comment:   External Document

## 2010-02-27 NOTE — Assessment & Plan Note (Signed)
Summary: EST-ROUTINE F/U VISIT/CH   Vital Signs:  Patient profile:   65 year old female Height:      64.4 inches (163.58 cm) Weight:      160.3 pounds (72.73 kg) BMI:     27.22 Temp:     97.0 degrees F (36.11 degrees C) oral Pulse rate:   94 / minute BP sitting:   105 / 67  (left arm) Cuff size:   regular  Vitals Entered By: Theotis Barrio NT II (November 28, 2009 1:42 PM) CC: PAIN ALL OVER /  FLU SHOT  /  ? LOW BLOOD SUGAR, Depression Is Patient Diabetic? No Pain Assessment Patient in pain? yes     Location: ALL OVER  Intensity:       7 Type: ALL Onset of pain  CHRONIC  Nutritional Status BMI of 25 - 29 = overweight  Have you ever been in a relationship where you felt threatened, hurt or afraid?No   Does patient need assistance? Functional Status Self care Ambulation Normal   Primary Care Noriko Macari:  Jason Coop MD  CC:  PAIN ALL OVER /  FLU SHOT  /  ? LOW BLOOD SUGAR and Depression.  History of Present Illness: This is a 65 year old woman with a history of depression, superior mesenteric artery ischemia 2008, and lumbar degenerative joint disease who present for routine follow up. At her last visit, the pts primary concern was her depression and her prozac was increased to 60 mg daily.  Pt was experiencing anhedonia, polyphagia and prolonged sleep needs.  Increase in prozac did not help.  Pt has been very tearful, and is spending 20 hours a day in bed. Pt only gets up to eat and use rest room. Pt not sure what causing depression  but states that she is afraid to hurt. Pt has been supplementing her pain control with goodies powders. Pt has pain in, back, right arm, knees, and left hip. Pt continues to have post herpetic neuralgia or right arm even though neurontin was increased to 600 mg three times a day at her last visit.   Pt also states that she is having issues with SOB when walking. SOB since increased cig smoking over last year. Now smoking 1 pack daily up from  3-4 cigs daily. Pt SOB is associated with coughing. Pt denies pain or palpitations.  Since her last visit, pt has gotten excision of her facial lesion questionable for BCC.  Pt is also concerned about a occasional feelings of hypoglycemia where she gets jittery and lightheaded. Pt checked CBG during that time and blood sugar was 51 during one episode and 72 during another.  Pt denies any, changes in her bm, changes in vision, fevers or chills.            Depression History:      The patient is having a depressed mood most of the day and has a diminished interest in her usual daily activities.        Comments:  FEELS DEPRESSED OVER THE DEATH OF HER DOG. NO SUICIDAL THOUGHT.   Preventive Screening-Counseling & Management  Alcohol-Tobacco     Alcohol drinks/day: 0     Smoking Status: current     Smoking Cessation Counseling: yes     Packs/Day: 1.0  Caffeine-Diet-Exercise     Caffeine use/day: 0     Does Patient Exercise: yes     Type of exercise: WALKING THE DOG     Times/week: 7  Problems Prior to Update: 1)  Hx of Contusion of Breast  (ICD-922.0) 2)  Hx of Carcinoma, Basal Cell  (ICD-173.9) 3)  Uri  (ICD-465.9) 4)  Herpes Zoster  (ICD-053.9) 5)  Oth&uns Sup Injr Oth Mx&uns Site w/o Mention Inf  (ICD-919.8) 6)  Hyperlipidemia  (ICD-272.4) 7)  Chronic Pain Syndrome  (ICD-338.4) 8)  Visual Acuity, Decreased, Left Eye  (ICD-369.9) 9)  Fracture, Arm, Right  (ICD-818.0) 10)  Cellulitis and Abscess of Face  (ICD-682.0) 11)  Fatigue  (ICD-780.79) 12)  Depression  (ICD-311) 13)  Toe Pain  (ICD-729.5) 14)  Carcinoma, Basal Cell, Cheek, Left  (ICD-173.3) 15)  External Hemorrhoids  (ICD-455.3) 16)  Temporomandibular Joint Pain  (ICD-524.62) 17)  Hip Pain  (ICD-719.45) 18)  Back Pain  (ICD-724.5) 19)  Compression Fracture, Thoracic Vertebra  (ICD-805.2) 20)  Low Blood Pressure  (ICD-458.9) 21)  Osteoporosis  (ICD-733.00) 22)  Health Maintenance Exam  (ICD-V70.0) 23)   Acute Osteomyelitis Other Specified Site  (ICD-730.08) 24)  Tobacco Abuse  (ICD-305.1) 25)  Vitamin B12 Deficiency  (ICD-266.2) 26)  Acute Duodenal Ulcer W/hemorrhage&obstruction  (ICD-532.01) 27)  Anemia  (ICD-285.9)  Allergies: 1)  ! Nsaids 2)  ! Ibuprofen  Family History: Reviewed history from 10/26/2007 and no changes required. Father: died from an MI at 62yo, DM, Mother died of lung cancer Sister- DM  Social History: Reviewed history from 09/11/2009 and no changes required. Now lives by herself in an ALF ([facility]) where she can come and go as she pleases. a Her good friend [name] is still looking out for her on a daily basis. Smokes about 1 pack  daily.   No EtOH   Packs/Day:  1.0  Review of Systems       Pt denies any fever, chest pain, chills, abdominal pain, change in her BM's or blood in stool.   Physical Exam  General:  alert, well-nourished, and well-hydrated.  alert, well-nourished, and well-hydrated.   Head:  normocephalic.  normocephalic.   Eyes:  vision grossly intact, pupils equal, pupils round, and pupils reactive to light.  vision grossly intact, pupils equal, pupils round, and pupils reactive to light.   Nose:  no external deformity and no nasal discharge.  no external deformity and no nasal discharge.   Neck:  supple. Lungs:  normal respiratory effort, normal breath sounds, no crackles, and no wheezes.  normal respiratory effort, normal breath sounds, no crackles, and no wheezes.   Heart:  normal rate, regular rhythm, no murmur, no gallop, and no rub.  normal rate, regular rhythm, no murmur, no gallop, and no rub.   Abdomen:  soft, non-tender, normal bowel sounds, and no distention.  soft, non-tender, normal bowel sounds, and no distention.   Msk:  Diffuse tenderness to palpation along the paraspinal muscles.  Full ROM of the arms, knees and hips with no overlying joint edmea or erythema.  Pulses:  2+ DP/PT pulses Neurologic:  cranial nerves II-XII  intact.  cranial nerves II-XII intact.   Skin:  Pt has bandage on face overlying biopsy site.  Pt instructed not to remove bandage until tomorrow.    Impression & Recommendations:  Problem # 1:  DEPRESSION (ICD-311) Pt continues to have significant symptoms of depression.  As a result I started Wellbutrin 100mg  tabs once daily and will likely taper up when I see her in three weeks.  Pt was instructed to try to get out of bed and remain active as this will help her mood.  The  patient has increased her smoking because of her depressive symptoms but understands the risks of this.  Pt was counseled regarding the importance of quitting.    The following medications were removed from the medication list:    Prozac 40 Mg Caps (Fluoxetine hcl) .Marland Kitchen... Take 1.5 tablet by mouth once a day Her updated medication list for this problem includes:    Wellbutrin 100 Mg Tabs (Bupropion hcl) .Marland Kitchen... Take 1 tablet by mouth once a day  Problem # 2:  CHRONIC PAIN SYNDROME (ICD-338.4) Pt has multiple organic causes of her pain, and pain has not been well controlled on her percocet.  Pt states that these begin to wear off after about 4 hours.  Because of this I am starting the patient on Oxycontin so as to provide more sustained relief.  I have instructed the patient not to use goodies powders as she has a hx of bleeding ulcers.  It is likely that the pts pain is contributing to her anxiety and depression.   Problem # 3:  Hx of CARCINOMA, BASAL CELL (ICD-173.9) This has been removed and is being followed by dermatology.   Problem # 4:  Preventive Health Care (ICD-V70.0) Pt recieved a flu shot today.   Problem # 5:  HERPES ZOSTER (ICD-053.9) Will continue to monitor and continue the patient's current dose of neurontin.  It is likely that the pts pain will be improved with the change in her narcotic regimen.  Problem # 6:  ? of COPD (ICD-496) Will do a therapeutic trial of albuterol for now.  Pt was instructed on the  use of an inhaler and informed that she should use it if she develops sob or wheezing.  May consider PFT's in the future.  Her updated medication list for this problem includes:    Proventil Hfa 108 (90 Base) Mcg/act Aers (Albuterol sulfate) .Marland Kitchen... 2 puffs as every 4-6 hours as needed for sob/wheeze  Problem # 7:  ? of HYPOGLYCEMIA, UNSPECIFIED (ICD-251.2) This is very unlikely to be pathologic.  Did check CBG today which was WNL.  Will continue to monitor for now.    Orders: T- Capillary Blood Glucose (16109)  Complete Medication List: 1)  Cobal-1000 1000 Mcg/ml Inj Soln (Cyanocobalamin) .... Inject once daily once a month 2)  Bd Luer-lok Syringe 21g X 1-1/2" 3 Ml Misc (Syringe/needle (disp)) 3)  Oscal 500/200 D-3 500-200 Mg-unit Tabs (Calcium-vitamin d) .... Take 3 tablets a day. 4)  Oxycontin 10 Mg Xr12h-tab (Oxycodone hcl) .... Take 1 tablet by mouth two times a day 5)  Pravachol 40 Mg Tabs (Pravastatin sodium) .... Take 1 tablet by mouth once a day at bedtime 6)  Neurontin 600 Mg Tabs (Gabapentin) .... Take 1 pill by mouth three times daily. 7)  Nexium 40 Mg Cpdr (Esomeprazole magnesium) .... Take one tab daily 8)  Cvs Stool Softener 100 Mg Caps (Docusate sodium) .... Once a day as needed for constipation 9)  Wellbutrin 100 Mg Tabs (Bupropion hcl) .... Take 1 tablet by mouth once a day 10)  Proventil Hfa 108 (90 Base) Mcg/act Aers (Albuterol sulfate) .... 2 puffs as every 4-6 hours as needed for sob/wheeze  Other Orders: Influenza Vaccine MCR (60454)  Patient Instructions: 1)  Please avoid goodies powders as they will increase your chances of bleeding from your stomach.  We are changing your medication for depression to wellbutrin.  If you note worsening of your depression, please go to the ED or call the clinic.  We are  also going to start an albuterol inhaler that you can use when you have shortness of breath.  I want to see you back in 3 weeks. Prescriptions: PROVENTIL HFA 108 (90  BASE) MCG/ACT AERS (ALBUTEROL SULFATE) 2 puffs as every 4-6 hours as needed for SOB/wheeze  #1 x 0   Entered and Authorized by:   Sinda Du MD   Signed by:   Sinda Du MD on 11/28/2009   Method used:   Print then Give to Patient   RxID:   [id number] OXYCONTIN 10 MG XR12H-TAB (OXYCODONE HCL) Take 1 tablet by mouth two times a day  #60 x 0   Entered and Authorized by:   Sinda Du MD   Signed by:   Sinda Du MD on 11/28/2009   Method used:   Handwritten   RxID:   [id number] WELLBUTRIN 100 MG TABS (BUPROPION HCL) Take 1 tablet by mouth once a day  #30 x 3   Entered and Authorized by:   Sinda Du MD   Signed by:   Sinda Du MD on 11/28/2009   Method used:   Print then Give to Patient   RxID:   [id number]    Orders Added: 1)  Est. Patient Level III [32202] 2)  T- Capillary Blood Glucose [82948] 3)  Influenza Vaccine MCR [00025]   Immunizations Administered:  Influenza Vaccine # 1:    Vaccine Type: Fluvax MCR    Site: left deltoid    Mfr: GlaxoSmithKline    Dose: 0.5 ml    Route: IM    Given by: Angelina Ok RN    Exp. Date: 07/26/2010    Lot #: [id number]    VIS given: 08/20/09 version given November 28, 2009.  Flu Vaccine Consent Questions:    Do you have a history of severe allergic reactions to this vaccine? no    Any prior history of allergic reactions to egg and/or gelatin? no    Do you have a sensitivity to the preservative Thimersol? no    Do you have a past history of Guillan-Barre Syndrome? no    Do you currently have an acute febrile illness? no    Have you ever had a severe reaction to latex? no    Vaccine information given and explained to patient? yes    Are you currently pregnant? no   Immunizations Administered:  Influenza Vaccine # 1:    Vaccine Type: Fluvax MCR    Site: left deltoid    Mfr: GlaxoSmithKline    Dose: 0.5 ml    Route: IM    Given by: Angelina Ok RN    Exp. Date: 07/26/2010    Lot #:  [id number]    VIS given: 08/20/09 version given November 28, 2009.   Prevention & Chronic Care Immunizations   Influenza vaccine: Fluvax MCR  (11/28/2009)   Influenza vaccine deferral: Deferred  (07/26/2008)   Influenza vaccine due: 09/26/2008    Tetanus booster: 09/11/2009: Tdap (State)   Td booster deferral: Refused  (04/05/2009)    Pneumococcal vaccine: Not documented   Pneumococcal vaccine deferral: Not indicated  (03/29/2009)    H. zoster vaccine: Not documented   H. zoster vaccine deferral: Deferred  (07/26/2008)  Colorectal Screening   Hemoccult: Not documented   Hemoccult action/deferral: Not indicated  (04/05/2009)   Hemoccult due: 07/26/2009    Colonoscopy: Not documented   Colonoscopy action/deferral: Deferred  (04/05/2009)  Other Screening   Pap smear: Not documented   Pap smear action/deferral: Not  indicated S/P hysterectomy  (09/10/2008)    Mammogram: ASSESSMENT: Negative - BI-RADS 1^MM DIGITAL SCREENING  (06/18/2009)   Mammogram action/deferral: Deferred  (04/05/2009)    DXA bone density scan: Not documented   DXA bone density action/deferral: Deferred  (04/05/2009)   Smoking status: current  (11/28/2009)   Smoking cessation counseling: yes  (11/28/2009)  Lipids   Total Cholesterol: 240  (03/29/2009)   Lipid panel action/deferral: Lipid Panel ordered   LDL: 152  (03/29/2009)   LDL Direct: Not documented   HDL: 64  (03/29/2009)   Triglycerides: 121  (03/29/2009)    SGOT (AST): 22  (02/25/2009)   SGPT (ALT): 15  (02/25/2009)   Alkaline phosphatase: 100  (02/25/2009)   Total bilirubin: 0.3  (02/25/2009)  Self-Management Support :   Personal Goals (by the next clinic visit) :      Personal LDL goal: 100  (03/29/2009)    Patient will work on the following items until the next clinic visit to reach self-care goals:     Medications and monitoring: take my medicines every day  (11/28/2009)     Eating: drink diet soda or water instead of juice or  soda, eat more vegetables, use fresh or frozen vegetables, eat baked foods instead of fried foods, eat fruit for snacks and desserts, limit or avoid alcohol  (11/28/2009)     Activity: join a walking program  (09/11/2009)    Lipid self-management support: Resources for patients handout  (11/28/2009)        Resource handout printed.

## 2010-02-27 NOTE — Progress Notes (Signed)
Summary: Refill/gh  Phone Note Refill Request Message from:  Patient on November 12, 2009 10:16 AM  Refills Requested: Medication #1:  PERCOCET 5-325 MG TABS Ta ke 1 tablet by mouth every 8 hours as needed for pain.  Method Requested: Pick up at Office Initial call taken by: Angelina Ok RN,  November 12, 2009 10:16 AM  Follow-up for Phone Call        Rx written Follow-up by: Mariea Stable MD,  November 12, 2009 11:04 AM    Prescriptions: PERCOCET 5-325 MG TABS (OXYCODONE-ACETAMINOPHEN) Ta ke 1 tablet by mouth every 8 hours as needed for pain.  #60 x 0   Entered and Authorized by:   Mariea Stable MD   Signed by:   Mariea Stable MD on 11/12/2009   Method used:   Print then Give to Patient   RxID:   [id number]   Appended Document: Refill/gh Percocet  Rx ready to be picked up; pt. was called.

## 2010-02-27 NOTE — Progress Notes (Signed)
Summary: refill/gg   Phone Note Refill Request  on February 13, 2010 11:29 AM  Refills Requested: Medication #1:  VICODIN ES 7.5-750 MG TABS Take one tab every 4 hours as neede for pain..   Last Refilled: 01/29/2010 Pt is out of meds.  Will you increase the # you give her?  She states some days she needs to take 4 a day.   Call pt when ready - [phone]   Method Requested: Telephone to Pharmacy Initial call taken by: Merrie Roof RN,  February 13, 2010 11:29 AM  Follow-up for Phone Call        I talked with Dr Cathey Endow and he wants vicodin 7.5/750 # 15 called into pharmacy.  Pt to be evaluated next week in clinic for pain evaluation before any more refills. Scheduled for appointment 1/24 Pt informed Follow-up by: Merrie Roof RN,  February 13, 2010 3:33 PM    Prescriptions: VICODIN ES 7.5-750 MG TABS (HYDROCODONE-ACETAMINOPHEN) Take one tab every 4 hours as neede for pain.  #15 x 0   Entered and Authorized by:   Sinda Du MD   Signed by:   Sinda Du MD on 02/13/2010   Method used:   Telephoned to ...       [facility] [phone]* (retail)       [address]       [address]       [place], [place]  [address]       Ph: [phone]       Fax: [phone]   RxID:   [id number]

## 2010-02-27 NOTE — Progress Notes (Signed)
Summary: new med/ hla  Phone Note Call from Patient   Summary of Call: pt says dr Daphene Calamity gave her dicyclomine 20mg  1tablet every 6-8 hrs ac Initial call taken by: Marin Roberts RN,  March 22, 2009 2:21 PM     Appended Document: new med/ hla    Clinical Lists Changes  Medications: Added new medication of BENTYL 20 MG TABS (DICYCLOMINE HCL) take 1 tablet by mouth three to four times a day, per Dr. Ewing Schlein.

## 2010-02-27 NOTE — Assessment & Plan Note (Signed)
Summary: F/U/POKHAREL/VS   Vital Signs:  Patient profile:   65 year old female Height:      64.4 inches (163.58 cm) Weight:      156.6 pounds (71.18 kg) BMI:     26.64 Temp:     97.0 degrees F (36.11 degrees C) oral Pulse rate:   77 / minute BP sitting:   103 / 52  (left arm) Cuff size:   regular  Vitals Entered By: Theotis Barrio NT II (February 25, 2009 9:49 AM) CC: PATIENT IS FOR MEDICATION REFILL  AND FOLLOW UP APPT Is Patient Diabetic? No Pain Assessment Patient in pain? no      Nutritional Status BMI of 25 - 29 = overweight  Have you ever been in a relationship where you felt threatened, hurt or afraid?No   Does patient need assistance? Functional Status Self care Ambulation Normal Comments PATIENT IS FOR MEDICATION REFILL AND FOLLOW UP APPT   Primary Care Provider:  Jason Coop MD  CC:  PATIENT IS FOR MEDICATION REFILL  AND FOLLOW UP APPT.  History of Present Illness: 65 yo woman with PMH as listed below who presents for regular fu. She was last seen 01-24-2009 by her PCP, Dr. Aleene Davidson.  Tobacco Abuse: She is still smoking  ~2 cigarettes/day. She says that she used to smoke several packs a day and that this is a dramatic reduction. She says that she smokes in part because of the pain she experiences and that after she has her appt at the pain clinic she hopes she will get better pain control and that she can stop completely.   Cataracts: Are bilateral and was to have surgery on the left eye before the right. Since her last visit she has had both of these performed and is doing well.   TMJ: The patient has been on Percocet for pain. She is to see a surgeon about this after she has eye surgery for cataracts. She is going to call her surgeon for this but tells me she just has not done so yet.   Chronic Pain: The patient was referred to a pain clinic last visit. She has an appointment February 11th.   Hyperlipidemia: The patient was started on  Pravastatin last visit.    Basal Cell Carcinoma: She has known basal cell carcinoma on her left cheek. She says that she is going to see her dermatologist.    Hx of osteo of the jaw: The patient says that she has been doing well and stopped and antibiotics  ~ 2 months ago. She says that she was on antibiotics for  ~2.5 years. The record shows that she stopped doxy and cipro on Dec 9th 2010.   Chin bleeding: She says that last night she started to have bleeding out of her chin. No clear drainage. No purulent drainage. She thinks that is started in the shower yesterday evening. She says that she has pain in this area today. She does not remember hitting or scratching this area but says it might be  possible that she scratched herself in the shower.   No fevers, chills, nausea, vomiting, diarrhea.     Preventive Screening-Counseling & Management  Alcohol-Tobacco     Alcohol drinks/day: 0     Smoking Status: current     Smoking Cessation Counseling: yes     Packs/Day: 5 cigs per day     Year Started: 1-2 cigs per month SOMETIME     Year Quit: 2009, 1 month  Caffeine-Diet-Exercise     Caffeine use/day: 0     Does Patient Exercise: yes     Type of exercise: WALKING THE DOG     Times/week: 7  Problems Prior to Update: 1)  Hyperlipidemia  (ICD-272.4) 2)  Chronic Pain Syndrome  (ICD-338.4) 3)  Visual Acuity, Decreased, Left Eye  (ICD-369.9) 4)  Viral Uri  (ICD-465.9) 5)  Fracture, Arm, Right  (ICD-818.0) 6)  Shoulder Pain, Left  (ICD-719.41) 7)  Cellulitis and Abscess of Face  (ICD-682.0) 8)  Fatigue  (ICD-780.79) 9)  Depression  (ICD-311) 10)  Toe Pain  (ICD-729.5) 11)  Carcinoma, Basal Cell, Cheek, Left  (ICD-173.3) 12)  External Hemorrhoids  (ICD-455.3) 13)  Temporomandibular Joint Pain  (ICD-524.62) 14)  Hip Pain  (ICD-719.45) 15)  Back Pain  (ICD-724.5) 16)  Compression Fracture, Thoracic Vertebra  (ICD-805.2) 17)  Low Blood Pressure  (ICD-458.9) 18)  Diarrhea   (ICD-787.91) 19)  Tachycardia  (ICD-785.0) 20)  Osteoporosis  (ICD-733.00) 21)  Dysuria  (ICD-788.1) 22)  Health Maintenance Exam  (ICD-V70.0) 23)  Acute Osteomyelitis Other Specified Site  (ICD-730.08) 24)  Tobacco Abuse  (ICD-305.1) 25)  Vitamin B12 Deficiency  (ICD-266.2) 26)  Helicobacter Pylori Infection  (ICD-041.86) 27)  Acute Duodenal Ulcer W/hemorrhage&obstruction  (ICD-532.01) 28)  Anemia  (ICD-285.9)  Current Medications (verified): 1)  Protonix 40 Mg  Tbec (Pantoprazole Sodium) .... Take 1 Tablet By Mouth Two Times A Day 2)  Cobal-1000 1000 Mcg/ml Inj Soln (Cyanocobalamin) .... Inject Once Daily Once A Month 3)  Bd Luer-Lok Syringe 21g X 1-1/2" 3 Ml  Misc (Syringe/needle (Disp)) 4)  Oscal 500/200 D-3 500-200 Mg-Unit  Tabs (Calcium-Vitamin D) .... Take 3 Tablets A Day. 5)  Anucort-Hc 25 Mg Supp (Hydrocortisone Acetate) .Marland Kitchen.. 1 Suppository Per Rectum 1-2 Times Per Day As Needed For Hemorrhoids 6)  Miralax  Pack (Polyethylene Glycol 3350) .... Take 1 Pack Once A Day. 7)  Evista 60 Mg Tabs (Raloxifene Hcl) .... Take 1 Tablet By Mouth Once A Day 8)  Prozac 40 Mg Caps (Fluoxetine Hcl) .... Take 1 Tablet By Mouth Once A Day 9)  Percocet 5-325 Mg Tabs (Oxycodone-Acetaminophen) .... Ta Ke 1 Tablet By Mouth Every 8 Hours As Needed For Pain. 10)  Flexeril 5 Mg Tabs (Cyclobenzaprine Hcl) .... Take 1 Tab By Mouth At Bedtime 11)  Zyrtec Allergy 10 Mg Caps (Cetirizine Hcl) .... Take 1 Pill By Mouth Daily For 7 Days 12)  Pravachol 20 Mg Tabs (Pravastatin Sodium) .... Take 1 Tablet By Mouth Once A Day 13)  Neurontin 300 Mg Caps (Gabapentin) .... Take 1 Pill By Mouth Three Times A Day.  Allergies (verified): 1)  ! Nsaids 2)  ! Ibuprofen  Social History: Packs/Day:  4 cigs/week  Review of Systems  The patient denies anorexia, fever, weight loss, weight gain, vision loss, decreased hearing, hoarseness, chest pain, syncope, dyspnea on exertion, peripheral edema, prolonged cough, headaches,  hemoptysis, abdominal pain, melena, hematochezia, severe indigestion/heartburn, hematuria, incontinence, genital sores, transient blindness, difficulty walking, unusual weight change, and enlarged lymph nodes.         see HPI  Physical Exam  General:  alert, well-developed, well-nourished, and well-hydrated.   Head:  normocephalic and atraumatic.   Eyes:  vision grossly intact, pupils equal, pupils round, and pupils reactive to light.   Ears:  no external deformities.   Nose:  no external deformity.   Lungs:  normal respiratory effort, no accessory muscle use, normal breath sounds, no crackles, and no wheezes.  Heart:  normal rate, regular rhythm, no murmur, no gallop, and no rub.   Abdomen:  soft, non-tender, and normal bowel sounds.   Msk:  Patient has scars and signs of multiple surgeries on right arm. No signs of infection. Has chronic pain in this area.  Neurologic:  alert & oriented X3, cranial nerves II-XII intact, and strength normal in all extremities.   Skin:  Patient has a plaque on left cheek that is known basal cell carcinoma.   The patient has a small excoriation on the chin. No drainage. Not currently bleeding. No mass or signs of skin infection in this area. Underlying bone not tender to palpation.  Cervical Nodes:  no anterior cervical adenopathy and no posterior cervical adenopathy.   Psych:  Oriented X3, memory intact for recent and remote, normally interactive, good eye contact, not anxious appearing, and not depressed appearing.     Impression & Recommendations:  Problem # 1:  HYPERLIPIDEMIA (ICD-272.4) Patient was started on statin last visit. Checking LFTs today.  Her updated medication list for this problem includes:    Pravachol 20 Mg Tabs (Pravastatin sodium) .Marland Kitchen... Take 1 tablet by mouth once a day  Orders: T-Comprehensive Metabolic Panel ([id number])  Problem # 2:  CHRONIC PAIN SYNDROME (ICD-338.4) Patient was referred to a pain clinic at last visit.    Problem # 3:  OTH&UNS SUP INJR OTH MX&UNS SITE W/O MENTION INF (ICD-919.8) Patient has what is clearly a small excoriation on her chin that is no longer bleeding and is starting to scab over. She admit that her skin is "thin" and that she can easily injur it despite the fact that she does not remember injuring it. She has have a history of osteo of the jaw: The patient says that she has been doing well and stopped and antibiotics  ~ 2 months ago. She says that she was on antibiotics for  ~2.5 years. The record shows that she stopped doxy and cipro on Dec 9th 2010. At first I was concerend from the history that perhaps this was the return of infection, but she has absolutely no signs of infection anywhere on exam. I think this is literally just a small skin excoriation that should heal without any problem. However, I have counseled her that she needs to monitor this closely given her history. I have explained that this should get better quickly and that it definitely should not get worse. If she has any increased pain, swelling, bleeding, drainage, etc., she needs to call the clinic immediately. She says that with her hx she definitely knows the signs of infection and that this feels distinctly different but that if she does have any problem she will let us know immediately.   Problem # 4:  CARCINOMA, BASAL CELL, CHEEK, LEFT (ICD-173.3) She is going to call her dermatologist to set up removal [most likely [facility]].  Problem # 5:  TEMPOROMANDIBULAR JOINT PAIN (ICD-524.62) She is calling her surgeon to set up a meeting about this as mentioned at last office visit.   Problem # 6:  TOBACCO ABUSE (ICD-305.1) She is still smoking as per HPI. She feels that part of her smoking is that it helps her relax with pain. Her hope is that once she establishes in the pain clinic she will get better pain relief and can stop smoking completely. Patient was counseled about the advserse health risks of smoking such as heart  disease, lung disease, stroke, and cancer. Patient was informed that the clinic is  willing and able to assist the patient in smoking cessation through counseling, medications, or smoking cessation aids such as nicotine patches or gums. The patient was encouraged  to contact the clinic anytime smoking cessation help is desired.   Complete Medication List: 1)  Protonix 40 Mg Tbec (Pantoprazole sodium) .... Take 1 tablet by mouth two times a day 2)  Cobal-1000 1000 Mcg/ml Inj Soln (Cyanocobalamin) .... Inject once daily once a month 3)  Bd Luer-lok Syringe 21g X 1-1/2" 3 Ml Misc (Syringe/needle (disp)) 4)  Oscal 500/200 D-3 500-200 Mg-unit Tabs (Calcium-vitamin d) .... Take 3 tablets a day. 5)  Anucort-hc 25 Mg Supp (Hydrocortisone acetate) .Marland Kitchen.. 1 suppository per rectum 1-2 times per day as needed for hemorrhoids 6)  Miralax Pack (Polyethylene glycol 3350) .... Take 1 pack once a day. 7)  Evista 60 Mg Tabs (Raloxifene hcl) .... Take 1 tablet by mouth once a day 8)  Prozac 40 Mg Caps (Fluoxetine hcl) .... Take 1 tablet by mouth once a day 9)  Percocet 5-325 Mg Tabs (Oxycodone-acetaminophen) .... Ta ke 1 tablet by mouth every 8 hours as needed for pain. 10)  Flexeril 5 Mg Tabs (Cyclobenzaprine hcl) .... Take 1 tab by mouth at bedtime 11)  Zyrtec Allergy 10 Mg Caps (Cetirizine hcl) .... Take 1 pill by mouth daily for 7 days 12)  Pravachol 20 Mg Tabs (Pravastatin sodium) .... Take 1 tablet by mouth once a day 13)  Neurontin 300 Mg Caps (Gabapentin) .... Take 1 pill by mouth three times a day.  Patient Instructions: 1)  Please schedule a followup appointment in 1 month to monitor your symptoms and to see how your chronic pain is going. 2)  Please call your dermatolgist regarding your basal cell carcinoma. Also call your surgeon to discuss your TMJ.  3)  Please keep your appointment at the pain clinic.  Prescriptions: PROZAC 40 MG CAPS (FLUOXETINE HCL) Take 1 tablet by mouth once a day  #31 x 5    Entered and Authorized by:   Aris Lot MD   Signed by:   Aris Lot MD on 02/25/2009   Method used:   Print then Give to Patient   RxID:   [id number] PERCOCET 5-325 MG TABS (OXYCODONE-ACETAMINOPHEN) Ta ke 1 tablet by mouth every 8 hours as needed for pain.  #60 x 0   Entered and Authorized by:   Aris Lot MD   Signed by:   Aris Lot MD on 02/25/2009   Method used:   Print then Give to Patient   RxID:   [id number]  Process Orders Check Orders Results:     [facility]: Check successful Tests Sent for requisitioning (February 26, 2009 7:32 AM):     02/25/2009: [facility] -- T-Comprehensive Metabolic Panel [phone] (signed)

## 2010-02-27 NOTE — Medication Information (Signed)
Summary: RX Folder  RX Folder   Imported By: Louretta Parma 08/15/2009 14:45:30  _____________________________________________________________________  External Attachment:    Type:   Image     Comment:   External Document

## 2010-02-27 NOTE — Letter (Signed)
Summary: OPERATIVE REPORT  OPERATIVE REPORT   Imported By: Margie Billet 04/24/2009 12:05:21  _____________________________________________________________________  External Attachment:    Type:   Image     Comment:   External Document  Appended Document: OPERATIVE REPORT This document is hardly readible.

## 2010-02-27 NOTE — Progress Notes (Signed)
Summary: refill/gg  Phone Note Refill Request  on November 11, 2009 2:35 PM  Refills Requested: Medication #1:  NEURONTIN 600 MG TABS Take 1 pill by mouth three times daily.   Last Refilled: 10/08/2009  Method Requested: Electronic Initial call taken by: Merrie Roof RN,  November 11, 2009 2:36 PM  Follow-up for Phone Call        Has Nov appt Follow-up by: Blanch Media MD,  November 11, 2009 2:58 PM    Prescriptions: NEURONTIN 600 MG TABS (GABAPENTIN) Take 1 pill by mouth three times daily.  #90 x 2   Entered and Authorized by:   Blanch Media MD   Signed by:   Blanch Media MD on 11/11/2009   Method used:   Electronically to        [facility] [address] [phone]* (retail)       [address]       [address]       [place], [place]  [address]       Ph: [phone]       Fax: [phone]   RxID:   [id number]

## 2010-02-27 NOTE — Assessment & Plan Note (Signed)
Summary: F/U/EST/VS   Vital Signs:  Patient profile:   65 year old female Height:      64.4 inches (163.58 cm) Weight:      159.8 pounds (71.18 kg) BMI:     26.64 Temp:     97.2 degrees F (36.22 degrees C) oral Pulse rate:   76 / minute BP sitting:   98 / 53  (left arm) Cuff size:   regular  Vitals Entered By: Theotis Barrio NT II (March 21, 2009 11:25 AM) CC: PATIENT IS HERE FOR  1 MONTH FOLLOW UP APPT / PAIN RIGHT ARM AND SHOULDER # 5,6  /  BACK PAIN 4/ MEDICATION REFILL Is Patient Diabetic? No Pain Assessment Patient in pain? yes     Location: BACK/ARM/SHOULDER Intensity: 4,5 Type: ALL Onset of pain  Chronic Nutritional Status BMI of 25 - 29 = overweight  Have you ever been in a relationship where you felt threatened, hurt or afraid?No   Does patient need assistance? Functional Status Self care Ambulation Normal Comments PATIENT IS HERE FOR 1 MONTH FOLLOW UP APPT /  PAIN IN RIGHT ARM AND SHOULDER  /  MEDICATION RERILLL   Primary Care Provider:  Jason Coop MD  CC:  PATIENT IS HERE FOR  1 MONTH FOLLOW UP APPT / PAIN RIGHT ARM AND SHOULDER # 5 and 6  /  BACK PAIN 4/ MEDICATION REFILL.  History of Present Illness: [name] Bell is a 65 yo lady with PMH as outlined in the EMR comes today for a f/u visit.    1. Chronic Pain: She went to pain clinic and her neurontin was increased to 400 mg three times a day, started on lidocaine cream. She now as little blisters and rash with itching, but before applying the lidocaine. She has started lidocaine and now she has no flaring of her rash. SHe is seeing Dr. Doroteo Bradford again in 4/11. She is getting her percocet from pain clinic.   2. TMJ dysfunction: She has not spoken with her surgeon about her TMJ pain.   3. Tobacco abuse: She is currntly smoking 5-6 ciggarettes a day. She smokes when her pain pills runs out. She wants to try to something to quit smoking.   4. HL: She started taking her pravastatin without any  problem.   5. Skin lesion: She is going to her dermatologist on the last week of April for looks like resection. It burns, hurts and is even sensitive to a gush of air.   6. Cold: She can not hear on the left side and she is hurting on her l ear. No discharge. No fever, chills, sore throat, but she has runny nose.   7. She broke some rash on her right eye 7 days ago, she had some vesicles and later it turned to scab.   Pt recently went to see Dr. Ewing Schlein and wants her labs to be faxed to her.   Depression History:      The patient denies a depressed mood most of the day and a diminished interest in her usual daily activities.         Preventive Screening-Counseling & Management  Alcohol-Tobacco     Alcohol drinks/day: 0     Smoking Status: current     Smoking Cessation Counseling: yes     Packs/Day: 5 cigs per day     Year Started: 1-2 cigs per month SOMETIME     Year Quit: 2009, 1 month  Caffeine-Diet-Exercise  Caffeine use/day: 0     Does Patient Exercise: yes     Type of exercise: WALKING THE DOG     Times/week: 7  Current Medications (verified): 1)  Protonix 40 Mg  Tbec (Pantoprazole Sodium) .... Take 1 Tablet By Mouth Two Times A Day 2)  Cobal-1000 1000 Mcg/ml Inj Soln (Cyanocobalamin) .... Inject Once Daily Once A Month 3)  Bd Luer-Lok Syringe 21g X 1-1/2" 3 Ml  Misc (Syringe/needle (Disp)) 4)  Oscal 500/200 D-3 500-200 Mg-Unit  Tabs (Calcium-Vitamin D) .... Take 3 Tablets A Day. 5)  Anucort-Hc 25 Mg Supp (Hydrocortisone Acetate) .Marland Kitchen.. 1 Suppository Per Rectum 1-2 Times Per Day As Needed For Hemorrhoids 6)  Miralax  Pack (Polyethylene Glycol 3350) .... Take 1 Pack Once A Day. 7)  Evista 60 Mg Tabs (Raloxifene Hcl) .... Take 1 Tablet By Mouth Once A Day 8)  Prozac 40 Mg Caps (Fluoxetine Hcl) .... Take 1 Tablet By Mouth Once A Day 9)  Percocet 5-325 Mg Tabs (Oxycodone-Acetaminophen) .... Ta Ke 1 Tablet By Mouth Every 8 Hours As Needed For Pain. 10)  Flexeril 5 Mg Tabs  (Cyclobenzaprine Hcl) .... Take 1 Tab By Mouth At Bedtime 11)  Zyrtec Allergy 10 Mg Caps (Cetirizine Hcl) .... Take 1 Pill By Mouth Daily For 7 Days 12)  Pravachol 20 Mg Tabs (Pravastatin Sodium) .... Take 1 Tablet By Mouth Once A Day 13)  Neurontin 400 Mg Caps (Gabapentin) .... Take 1 Pill By Mouth Three Times A Day. 14)  Nicoderm Cq 21 Mg/24hr Pt24 (Nicotine) .... Upply 1 Patch Locally Daily.  Allergies: 1)  ! Nsaids 2)  ! Ibuprofen  Review of Systems      See HPI  Physical Exam  General:  alert.   Ears:  R ear normal and L ear normal.   Mouth:  pharynx pink and moist, no erythema, no exudates, and no posterior lymphoid hypertrophy.   Lungs:  normal breath sounds, no crackles, and no wheezes.   Heart:  normal rate, regular rhythm, and no murmur.   Abdomen:  soft and non-tender.   Extremities:  trace left pedal edema and trace right pedal edema.   Neurologic:  alert & oriented X3.   Skin:  Left arm: There is a red healing rash on a dermatomal fashion with root close to mid back and spreading over right shoulder, upper and lower arm. No vesicles or discharge needed.    Impression & Recommendations:  Problem # 1:  HYPERLIPIDEMIA (ICD-272.4) Will check following. Cont same for now.  Her updated medication list for this problem includes:    Pravachol 20 Mg Tabs (Pravastatin sodium) .Marland Kitchen... Take 1 tablet by mouth once a day  Orders: T-Lipid Profile ([id number]) T-TSH [phone]) T-T4, Free [phone])  Problem # 2:  CHRONIC PAIN SYNDROME (ICD-338.4) Pt is followed at Pain Clinic. She has not yet started to get percocet from there and asks me to refill it. I will refill it today until she sees Dr. Doroteo Bradford again. It makes sense that she get her pain medicine from one doctor only. She has an appointment coming with Dr. Doroteo Bradford and since her pain is comprehensively managed at the pain clinic, it makes sense for her to get her percocet from the pain clinic. I will send a copy of my  note to Dr. Doroteo Bradford.  She states she is on neurontin 400mg  three times a day.   Problem # 3:  CARCINOMA, BASAL CELL, CHEEK, LEFT (ICD-173.3) She has an appointment with her  dermatologist at the end of April. I asked her to call her doctor's office to get an early appointment and if they say it is OK to wait until then, it is fine.   Problem # 4:  LOW BLOOD PRESSURE (ICD-458.9) BP relatively low, but close to baseline, will f/u.   Problem # 5:  TOBACCO ABUSE (ICD-305.1) She wants to try nicotine patch.  Her updated medication list for this problem includes:    Nicoderm Cq 21 Mg/24hr Pt24 (Nicotine) ..... Upply 1 patch locally daily.  Complete Medication List: 1)  Protonix 40 Mg Tbec (Pantoprazole sodium) .... Take 1 tablet by mouth two times a day 2)  Cobal-1000 1000 Mcg/ml Inj Soln (Cyanocobalamin) .... Inject once daily once a month 3)  Bd Luer-lok Syringe 21g X 1-1/2" 3 Ml Misc (Syringe/needle (disp)) 4)  Oscal 500/200 D-3 500-200 Mg-unit Tabs (Calcium-vitamin d) .... Take 3 tablets a day. 5)  Anucort-hc 25 Mg Supp (Hydrocortisone acetate) .Marland Kitchen.. 1 suppository per rectum 1-2 times per day as needed for hemorrhoids 6)  Miralax Pack (Polyethylene glycol 3350) .... Take 1 pack once a day. 7)  Evista 60 Mg Tabs (Raloxifene hcl) .... Take 1 tablet by mouth once a day 8)  Prozac 40 Mg Caps (Fluoxetine hcl) .... Take 1 tablet by mouth once a day 9)  Percocet 5-325 Mg Tabs (Oxycodone-acetaminophen) .... Ta ke 1 tablet by mouth every 8 hours as needed for pain. 10)  Flexeril 5 Mg Tabs (Cyclobenzaprine hcl) .... Take 1 tab by mouth at bedtime 11)  Zyrtec Allergy 10 Mg Caps (Cetirizine hcl) .... Take 1 pill by mouth daily for 7 days 12)  Pravachol 20 Mg Tabs (Pravastatin sodium) .... Take 1 tablet by mouth once a day 13)  Neurontin 400 Mg Caps (Gabapentin) .... Take 1 pill by mouth three times a day. 14)  Nicoderm Cq 21 Mg/24hr Pt24 (Nicotine) .... Upply 1 patch locally daily.  Patient  Instructions: 1)  Please schedule a follow-up appointment in 1 month. 2)  Limit your Sodium (Salt) to less than 2 grams a day(slightly less than 1/2 a teaspoon) to prevent fluid retention, swelling, or worsening of symptoms. 3)  Tobacco is very bad for your health and your loved ones! You Should stop smoking!. 4)  Stop Smoking Tips: Choose a Quit date. Cut down before the Quit date. decide what you will do as a substitute when you feel the urge to smoke(gum,toothpick,exercise). Prescriptions: NICODERM CQ 21 MG/24HR PT24 (NICOTINE) upply 1 patch locally daily.  #30 x 5   Entered and Authorized by:   Jason Coop MD   Signed by:   Jason Coop MD on 03/21/2009   Method used:   Print then Give to Patient   RxID:   [id number] PERCOCET 5-325 MG TABS (OXYCODONE-ACETAMINOPHEN) Ta ke 1 tablet by mouth every 8 hours as needed for pain.  #60 x 0   Entered and Authorized by:   Jason Coop MD   Signed by:   Jason Coop MD on 03/21/2009   Method used:   Print then Give to Patient   RxID:   [id number]  Process Orders Check Orders Results:     [facility]: Check successful Tests Sent for requisitioning (March 21, 2009 1:58 PM):     03/21/2009: [facility] -- T-Lipid Profile [phone] (signed)     03/21/2009: [facility] -- T-TSH [phone] (signed)     03/21/2009: [facility] -- [place], [place] [[id number]] (signed)    Prevention &  Chronic Care Immunizations   Influenza vaccine: Fluvax MCR  (01/24/2009)   Influenza vaccine deferral: Deferred  (07/26/2008)   Influenza vaccine due: 09/26/2008    Tetanus booster: Not documented   Td booster deferral: Deferred  (07/26/2008)    Pneumococcal vaccine: Not documented   Pneumococcal vaccine deferral: Deferred  (09/10/2008)    H. zoster vaccine: Not documented   H. zoster vaccine deferral: Deferred  (07/26/2008)  Colorectal Screening    Hemoccult: Not documented   Hemoccult action/deferral: Deferred  (09/10/2008)   Hemoccult due: 07/26/2009    Colonoscopy: Not documented   Colonoscopy action/deferral: Deferred  (09/10/2008)  Other Screening   Pap smear: Not documented   Pap smear action/deferral: Not indicated S/P hysterectomy  (09/10/2008)    Mammogram: Not documented   Mammogram action/deferral: Ordered  (09/10/2008)    DXA bone density scan: Not documented   DXA bone density action/deferral: Deferred  (09/10/2008)   Smoking status: current  (03/21/2009)   Smoking cessation counseling: yes  (03/21/2009)  Lipids   Total Cholesterol: 307  (10/26/2008)   Lipid panel action/deferral: Lipid Panel ordered   LDL: 205  (10/26/2008)   LDL Direct: Not documented   HDL: 76  (10/26/2008)   Triglycerides: 132  (10/26/2008)    SGOT (AST): 22  (02/25/2009)   SGPT (ALT): 15  (02/25/2009)   Alkaline phosphatase: 100  (02/25/2009)   Total bilirubin: 0.3  (02/25/2009)  Self-Management Support :    Patient will work on the following items until the next clinic visit to reach self-care goals:     Medications and monitoring: take my medicines every day, bring all of my medications to every visit  (03/21/2009)     Eating: drink diet soda or water instead of juice or soda, eat more vegetables, use fresh or frozen vegetables, eat foods that are low in salt, eat baked foods instead of fried foods, eat fruit for snacks and desserts, limit or avoid alcohol  (03/21/2009)     Activity: take a 30 minute walk every day  (03/21/2009)    Lipid self-management support: Resources for patients handout  (03/21/2009)        Resource handout printed.   Process Orders Check Orders Results:     [facility]: Check successful Tests Sent for requisitioning (March 21, 2009 1:58 PM):     03/21/2009: [facility] -- T-Lipid Profile [phone] (signed)     03/21/2009: [facility] -- T-TSH  [phone] (signed)     03/21/2009: [facility] -- [place], [place] [[id number]] (signed)

## 2010-02-27 NOTE — Progress Notes (Signed)
Summary: refill/gg  Phone Note Refill Request  on July 15, 2009 12:42 PM  Refills Requested: Medication #1:  PERCOCET 5-325 MG TABS Ta ke 1 tablet by mouth every 8 hours as needed for pain.   Last Refilled: 06/13/2009 Pt # [id number]   Method Requested: Pick up at Office Initial call taken by: Merrie Roof RN,  July 15, 2009 12:42 PM  Follow-up for Phone Call       Follow-up by: Jason Coop MD,  July 15, 2009 2:42 PM    Prescriptions: PERCOCET 5-325 MG TABS (OXYCODONE-ACETAMINOPHEN) Ta ke 1 tablet by mouth every 8 hours as needed for pain.  #60 x 0   Entered and Authorized by:   Jason Coop MD   Signed by:   Jason Coop MD on 07/15/2009   Method used:   Print then Give to Patient   RxID:   [id number]

## 2010-02-27 NOTE — Medication Information (Signed)
Summary: PERCOCET  PERCOCET   Imported By: Margie Billet 07/16/2009 14:52:36  _____________________________________________________________________  External Attachment:    Type:   Image     Comment:   External Document

## 2010-02-27 NOTE — Progress Notes (Signed)
  Phone Note Outgoing Call   Call placed by: Dr. Cathey Endow Call placed to: Patient Action Taken: Phone Call Completed Summary of Call: An attempt was made to contact patient regarding her depression symptoms after going up on her Prozac two weeks ago.  Patient did not answer but is scheduled to see me in two weeks.  Will reevaluate at that time.

## 2010-02-27 NOTE — Medication Information (Signed)
Summary: PERCOCET  PERCOCET   Imported By: Shon Hough 10/16/2009 12:08:15  _____________________________________________________________________  External Attachment:    Type:   Image     Comment:   External Document

## 2010-02-27 NOTE — Medication Information (Signed)
Summary: PERCOCET   PERCOCET   Imported By: Margie Billet 05/10/2009 13:51:23  _____________________________________________________________________  External Attachment:    Type:   Image     Comment:   External Document

## 2010-02-27 NOTE — Assessment & Plan Note (Signed)
Summary: ET-3 WEEK RECHECK/CH   Vital Signs:  Patient profile:   65 year old female Height:      64.4 inches Weight:      170.2 pounds BMI:     28.96 Temp:     97.3 degrees F oral Pulse rate:   94 / minute BP sitting:   116 / 66  (right arm)  Vitals Entered By: Filomena Jungling NT II (December 23, 2009 1:37 PM) CC: followupvisit, Depression, rash Is Patient Diabetic? No Pain Assessment Patient in pain? yes     Location: feet,shoulder,back and  Intensity: 10 Onset of pain  1 week Nutritional Status BMI of 25 - 29 = overweight  Have you ever been in a relationship where you felt threatened, hurt or afraid?No   Does patient need assistance? Functional Status Self care Ambulation Normal   Primary Care Provider:  Jason Coop MD  CC:  followupvisit, Depression, and rash.  History of Present Illness: This is a 65 year old woman with a history of depression, superior mesenteric artery ischemia 2008, and lumbar degenerative joint disease who presents for 3 week follow up for depression. At the last visit I started Wellbutrin as her prozac was not helping.  I also started oxycontin as pain was not controled on percocet and pt was using large amounts of goodies powders.  I also started pt on albuterol becuase of presumed COPD.   Since her last visit, pt has noted a great improvement in her mood and states that we "threw her a life line"  Pt feels much greater hope and has a much improved affect.  Pt also states that she has not cried in 3 weeks where she was crying daily.    The patient's pain is not under perfect control at this point, but the oxycontin is working better than the percocet. Unfortunately, pt has run out of her pain medications because she ran out when she "doubled up" because of the leg pain caused by her rash.   Pt has also been able to decrease her goodies powder use since starting the new medication.    Pts primary concern at this point in time is a diffuse  papular rash that started about 3 weeks ago.  The pt first noticed pain then erythematous papules that were very pruritic.  The rish was first noted on the back of her neck then spread to her feet/legs and has now generalized to the rest of her body.  Pt has been using lidocane ointment and an OTC ointment called Lanacane.   Pt is currently living at [facility] facility and there has been report of a "bed bug infestation."  Although pt was having SOB with exertion, she has been using her albuterol inhaler and has not noted any more problems.  Pt's scar from MOHS surgery is also healing very well and she has had no new problems.   Pt denies fever, chills, chest pain, sob, or weight change.    Depression History:      The patient denies a depressed mood most of the day and a diminished interest in her usual daily activities.         Preventive Screening-Counseling & Management  Alcohol-Tobacco     Alcohol drinks/day: 0     Smoking Status: current     Smoking Cessation Counseling: yes     Packs/Day: 4 cigs/week     Year Started: 1-2 cigs per month SOMETIME     Year Quit: 2009, 1  month  Caffeine-Diet-Exercise     Caffeine use/day: 0     Does Patient Exercise: yes     Type of exercise: WALKING THE DOG     Times/week: 7  Allergies: 1)  ! Nsaids 2)  ! 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 Watt Climes    - f/u by gen. surg.    - expecting her to require bowel resection (i.e. when the sx recur) DUODENAL ULCER with hemorrhage and obstruction 11/2006 MANDIBULAR OSTEOMYELITIS with recurrent chin abscess    - started 11/2006 while in hospital. Gram showed GNR and cx was negative.    - cx negative x 2 additional times in opc    - s/p debridement by Dr. Lilli Few    - Grew S. Bovis - s/p 4 weeks of Pen V started 05/19/07 and additional 2 weeks started 07/04/07 DEGENERATIVE DISC DISEASE  L5-S1 CHRONIC INTERSCAPULAR PAIN    - Mild compression of T12 superior endplate with a Schmorl's node - doc'd on CT scan 03/22/06 Depression  Family History: Reviewed history from 10/26/2007 and no changes required. Father: died from an MI at 56yo, DM, Mother died of lung cancer Sister- DM  Social History: Reviewed history from 11/28/2009 and no changes required. Now lives by herself in an ALF ([facility]) where she can come and go as she pleases. a Her good friend Truman Hayward is still looking out for her on a daily basis. Smokes about 1 pack  daily.   No EtOH   Packs/Day:  4 cigs/week  Review of Systems       Negative as per HPI.   Physical Exam  General:  alert and well-developed.   Head:  normocephalic and atraumatic.   Eyes:  vision grossly intact, pupils equal, pupils round, and pupils reactive to light.   Nose:  no nasal discharge.   Mouth:  pharynx pink and moist.   Neck:  excorations and erythematous papules on the posterior neck and back.  Lungs:  normal respiratory effort, normal breath sounds, no crackles, and no wheezes.   Heart:  normal rate, regular rhythm, no murmur, no gallop, and no rub.   Abdomen:  soft, non-tender, normal bowel sounds, no distention, and no masses.   Pulses:  2+ dp/pt pulses bilaterally.  Skin:  There are diffuse escoriations and erythematous 59mm papules which are primarily in the distal extremities but also located on the trunk, back and breasts.    Impression & Recommendations:  Problem # 1:  DEPRESSION (ICD-311) This has greatly improved but will Increase wellbutrin to 100 mg three times a day today.  Pt is tolerating the medication well and has not reported any side effects.   Her updated medication list for this problem includes:    Wellbutrin 100 Mg Tabs (Bupropion hcl) .Marland Kitchen... Take 1 tablet by mouth three times a day    Hydroxyzine Hcl 25 Mg Tabs (Hydroxyzine hcl) .Marland Kitchen... Take every 6 hours by mouth as needed for itching.  Problem # 2:   Rash Etiology is unknown at this time.  Possibly contact dermatitis vs scabies.  Will treat with topical permethrin.  Although wellbutrin can cause erythema multiforme pt began to develop the rash prior to taking her first dose of wellbutrin.  Given the recent hx of bed bug infestation at the pts facility, we instructed her that she should clean her sheets and steam her mattress.  I  will also give pt hydroxyzine which should help to improve her itching.   Pt was told how contagious scabies was and was asked to inform her facility that she could have scabies.  Pt was instructed on the use of Permethrin and given a refill so that she could reuse it in one week if she continued having symptoms.   Problem # 3:  ? of COPD (ICD-496) Pt has improved with albuterol and is now denying any SOB.  Pt is using her MDI appropriately and modeled her use in the office today.  Pt should have PFT's in the future.  Her updated medication list for this problem includes:    Proventil Hfa 108 (90 Base) Mcg/act Aers (Albuterol sulfate) .Marland Kitchen... 2 puffs as every 4-6 hours as needed for sob/wheeze  Problem # 4:  Hx of CARCINOMA, BASAL CELL (ICD-173.9) Scar is healing very well and pt is happy with results.   Problem # 5:  CHRONIC PAIN SYNDROME (ICD-338.4) I will keep pt on her current regimen at this point in time.  I did fill pts prescription early today but informed her that I would not do it again.  I counseled pt on the dangers of doubling her dose without her doctors knowledge given the long acting nature of oxycontin.  Complete Medication List: 1)  Cobal-1000 1000 Mcg/ml Inj Soln (Cyanocobalamin) .... Inject once daily once a month 2)  Bd Luer-lok Syringe 21g X 1-1/2" 3 Ml Misc (Syringe/needle (disp)) 3)  Oscal 500/200 D-3 500-200 Mg-unit Tabs (Calcium-vitamin d) .... Take 3 tablets a day. 4)  Oxycontin 10 Mg Xr12h-tab (Oxycodone hcl) .... Take 1 tablet by mouth two times a day 5)  Pravachol 40 Mg Tabs (Pravastatin  sodium) .... Take 1 tablet by mouth once a day at bedtime 6)  Neurontin 600 Mg Tabs (Gabapentin) .... Take 1 pill by mouth three times daily. 7)  Nexium 40 Mg Cpdr (Esomeprazole magnesium) .... Take one tab daily 8)  Cvs Stool Softener 100 Mg Caps (Docusate sodium) .... Once a day as needed for constipation 9)  Wellbutrin 100 Mg Tabs (Bupropion hcl) .... Take 1 tablet by mouth three times a day 10)  Proventil Hfa 108 (90 Base) Mcg/act Aers (Albuterol sulfate) .... 2 puffs as every 4-6 hours as needed for sob/wheeze 11)  Permethrin 5 % Crea (Permethrin) .... Apply from head to toe leave for 8-12 hours then rinse.  may reapply after 1 week if needed. 12)  Hydroxyzine Hcl 25 Mg Tabs (Hydroxyzine hcl) .... Take every 6 hours by mouth as needed for itching.  Patient Instructions: 1)  Please tell the people at [facility] that you may have scabies.  I will see you again in 1 month to make sure that your rash has cleared up. Use your medications as prescribed and call us if you have any problems.  Prescriptions: WELLBUTRIN 100 MG TABS (BUPROPION HCL) Take 1 tablet by mouth three times a day  #90 x 1   Entered and Authorized by:   Jola Schmidt MD   Signed by:   Jola Schmidt MD on 12/23/2009   Method used:   Print then Give to Patient   RxID:   [id number] HYDROXYZINE HCL 25 MG TABS (HYDROXYZINE HCL) Take every 6 hours by mouth as needed for itching.  #60 x 1   Entered and Authorized by:   Jola Schmidt MD   Signed by:   Jola Schmidt MD on 12/23/2009   Method used:   Print then  Give to Patient   RxID:   [id number] PERMETHRIN 5 % CREA (PERMETHRIN) Apply from head to toe leave for 8-12 hours then rinse.  May reapply after 1 week if needed.  #1 tube x 1   Entered and Authorized by:   Sinda Du MD   Signed by:   Sinda Du MD on 12/23/2009   Method used:   Print then Give to Patient   RxID:   [id number] OXYCONTIN 10 MG XR12H-TAB (OXYCODONE HCL) Take 1 tablet by mouth two  times a day  #60 x 0   Entered and Authorized by:   Sinda Du MD   Signed by:   Sinda Du MD on 12/23/2009   Method used:   Print then Give to Patient   RxID:   [id number] HYDROXYZINE HCL 25 MG TABS (HYDROXYZINE HCL) Take every 6 hours by mouth as needed for itching.  #60 x 1   Entered and Authorized by:   Sinda Du MD   Signed by:   Sinda Du MD on 12/23/2009   Method used:   Print then Give to Patient   RxID:   [id number] PERMETHRIN 5 % CREA (PERMETHRIN) Apply from head to toe leave for 8-12 hours then rinse.  May reapply after 1 week if needed.  #1 tube x 1   Entered and Authorized by:   Sinda Du MD   Signed by:   Sinda Du MD on 12/23/2009   Method used:   Print then Give to Patient   RxID:   [id number]    Orders Added: 1)  Est. Patient Level III [51884]    Prevention & Chronic Care Immunizations   Influenza vaccine: Fluvax MCR  (11/28/2009)   Influenza vaccine deferral: Deferred  (07/26/2008)   Influenza vaccine due: 09/26/2008    Tetanus booster: 09/11/2009: Tdap (State)   Td booster deferral: Refused  (04/05/2009)    Pneumococcal vaccine: Not documented   Pneumococcal vaccine deferral: Not indicated  (03/29/2009)    H. zoster vaccine: Not documented   H. zoster vaccine deferral: Deferred  (07/26/2008)  Colorectal Screening   Hemoccult: Not documented   Hemoccult action/deferral: Not indicated  (04/05/2009)   Hemoccult due: 07/26/2009    Colonoscopy: Not documented   Colonoscopy action/deferral: Deferred  (04/05/2009)  Other Screening   Pap smear: Not documented   Pap smear action/deferral: Not indicated S/P hysterectomy  (09/10/2008)    Mammogram: ASSESSMENT: Negative - BI-RADS 1^MM DIGITAL SCREENING  (06/18/2009)   Mammogram action/deferral: Deferred  (04/05/2009)    DXA bone density scan: Not documented   DXA bone density action/deferral: Deferred  (04/05/2009)   Smoking status: current  (12/23/2009)    Smoking cessation counseling: yes  (12/23/2009)  Lipids   Total Cholesterol: 240  (03/29/2009)   Lipid panel action/deferral: Lipid Panel ordered   LDL: 152  (03/29/2009)   LDL Direct: Not documented   HDL: 64  (03/29/2009)   Triglycerides: 121  (03/29/2009)    SGOT (AST): 22  (02/25/2009)   SGPT (ALT): 15  (02/25/2009)   Alkaline phosphatase: 100  (02/25/2009)   Total bilirubin: 0.3  (02/25/2009)  Self-Management Support :   Personal Goals (by the next clinic visit) :      Personal LDL goal: 100  (03/29/2009)    Patient will work on the following items until the next clinic visit to reach self-care goals:     Medications and monitoring: take my medicines every day  (12/23/2009)     Eating: drink diet soda or  water instead of juice or soda, eat more vegetables, use fresh or frozen vegetables, eat foods that are low in salt, eat baked foods instead of fried foods, eat fruit for snacks and desserts, limit or avoid alcohol  (12/23/2009)     Activity: take a 30 minute walk every day  (12/23/2009)    Lipid self-management support: Resources for patients handout  (11/28/2009)

## 2010-02-27 NOTE — Consult Note (Signed)
Summary: [facility]   Imported By: Florinda Marker 03/04/2009 10:45:21  _____________________________________________________________________  External Attachment:    Type:   Image     Comment:   External Document

## 2010-02-27 NOTE — Progress Notes (Signed)
Summary: PREVENTIVE COLONOSCOPY  Phone Note Outgoing Call   Call placed by: Shon Hough,  November 28, 2009 8:33 AM Summary of Call: No info in EMR regarding last colonoscopy.  Found infomation in [place].  Printed and given to nurse Date of Procedure 12/13/2006. Initial call taken by: Shon Hough,  November 28, 2009 8:35 AM

## 2010-02-27 NOTE — Medication Information (Signed)
Summary: PERCOCET  PERCOCET   Imported By: Margie Billet 06/27/2009 13:50:03  _____________________________________________________________________  External Attachment:    Type:   Image     Comment:   External Document

## 2010-02-27 NOTE — Assessment & Plan Note (Signed)
Summary: pain meds/gg   Vital Signs:  Patient profile:   65 year old female Height:      64.4 inches (163.58 cm) Weight:      169.03 pounds (76.83 kg) BMI:     28.76 Temp:     98.2 degrees F (36.78 degrees C) oral Pulse rate:   99 / minute BP sitting:   103 / 61  (left arm) Cuff size:   regular  Vitals Entered By: Angelina Ok RN (February 18, 2010 11:03 AM) CC: Depression Is Patient Diabetic? No Pain Assessment Patient in pain? yes     Location: neck, back, legs, feet Intensity: 7 Type: aching Onset of pain  Constant Nutritional Status BMI of 25 - 29 = overweight  Have you ever been in a relationship where you felt threatened, hurt or afraid?No   Does patient need assistance? Functional Status Self care Ambulation Normal Comments Achey today.  No fever.  Tired run down.  Stopped up.  Using nasal spray.  Coughing non- productive.   Primary Care Provider:  Sinda Du MD  CC:  Depression.  History of Present Illness: 65 y/o w with h/o chronic back pain and narcotic dependancy comes for pain meds refill  she takes vicodin 7.5/750 mg- 2-4 tabs a day with upto  5 tabs a day occasionall she takes it for back pain she has never been to orthopedic or sports medicine doctor she tried PT but did not help with her pain she c/o pain radiating to her left thigh and hip, no numbness but does have tingling she has a h/o compound fracture in the lower back and fracture in the wrist from trivial fall due to osteoporosis   Depression History:      The patient denies a depressed mood most of the day and a diminished interest in her usual daily activities.        Comments:  Wellbutrin is helpng.  Getting better.   Preventive Screening-Counseling & Management  Alcohol-Tobacco     Alcohol drinks/day: 0     Smoking Status: current     Smoking Cessation Counseling: yes     Packs/Day: 0.5     Year Started: 1-2 cigs per month SOMETIME     Year Quit: 2009, 1 month  Current  Medications (verified): 1)  Cobal-1000 1000 Mcg/ml Inj Soln (Cyanocobalamin) .... Inject Once Daily Once A Month 2)  Bd Luer-Lok Syringe 21g X 1-1/2" 3 Ml  Misc (Syringe/needle (Disp)) 3)  Oscal 500/200 D-3 500-200 Mg-Unit  Tabs (Calcium-Vitamin D) .... Take 3 Tablets A Day. 4)  Pravachol 40 Mg Tabs (Pravastatin Sodium) .... Take 1 Tablet By Mouth Once A Day At Bedtime 5)  Neurontin 600 Mg Tabs (Gabapentin) .... Take 1 Pill By Mouth Three Times Daily. 6)  Nexium 40 Mg Cpdr (Esomeprazole Magnesium) .... Take One Tab Daily 7)  Cvs Stool Softener 100 Mg Caps (Docusate Sodium) .... Once A Day As Needed For Constipation 8)  Wellbutrin 100 Mg Tabs (Bupropion Hcl) .... Take 1 Tablet By Mouth Three Times A Day 9)  Proventil Hfa 108 (90 Base) Mcg/act Aers (Albuterol Sulfate) .... 2 Puffs As Every 4-6 Hours As Needed For Sob/wheeze 10)  Permethrin 5 % Crea (Permethrin) .... Apply From Head To Toe Leave For 8-12 Hours Then Rinse.  May Reapply After 1 Week If Needed. 11)  Hydroxyzine Hcl 25 Mg Tabs (Hydroxyzine Hcl) .... Take Every 6 Hours By Mouth As Needed For Itching. 12)  Vicodin Es 7.5-750 Mg Tabs (  Hydrocodone-Acetaminophen) .... Take One Tab Every 4 Hours As Neede For Pain.  Allergies (verified): 1)  ! Nsaids 2)  ! Ibuprofen  Review of Systems  The patient denies anorexia, fever, weight loss, weight gain, vision loss, decreased hearing, hoarseness, chest pain, syncope, dyspnea on exertion, peripheral edema, prolonged cough, headaches, hemoptysis, abdominal pain, melena, hematochezia, severe indigestion/heartburn, hematuria, incontinence, genital sores, muscle weakness, suspicious skin lesions, transient blindness, difficulty walking, depression, unusual weight change, abnormal bleeding, enlarged lymph nodes, angioedema, breast masses, and testicular masses.    Physical Exam  General:  Gen: VS reveiwed, Alert, well developed, nodistress ENT: mucous membranes pink & moist. No abnormal finds in ear  and nose. CVC:S1 S2 , no murmurs, no abnormal heart sounds. Lungs: Clear to auscultation B/L. No wheezes, crackles or other abnormal sounds Abdomen: soft, non distended, no tender. Normal Bowel sounds EXT: no pitting edema, no engorged veins, Pulsations normal  Neuro:alert, oriented *3, cranial nerved 2-12 intact, strenght normal in all  extremities, senstations normal to light touch.      Impression & Recommendations:  Problem # 1:  BACK PAIN (ICD-724.5) Patient has severe back pain with neuroligcal symptoms she has lumbar and cervical pain she never had a CT or MRI to diagnose the cause of her back pain as per our EMR and her report Also, she has never been to an orthopedic doctor or sports medicine doctor  plan - given her h/o osteoporosis (not taking boniva for 3 months now) and neurological symptoms- will get MRI of her back - will refer her to orthopedic for further managment options - Given she has been on long acting narcotic which did not help her much and the pcp transitioned her to just short acting opoids, she will need a vicodin every 4 hrs based on the pharmacology. This will justify upto 6 tablets a day but she does not get up at night with pain, so will give her a prescription of 150 tabs ( 5 per day). Will get UDS and new pain contract.  - follow up in 4-6 weeks with pcp to follow up on ortho reccomendation, pain control.  - she has been to pain clinic 1 year ago for her hand pain mostly, which can be considered again by the pcp if her pain medications becomes difficult to manage.   Her updated medication list for this problem includes:    Vicodin Es 7.5-750 Mg Tabs (Hydrocodone-acetaminophen) .Marland Kitchen... Take one tab every 4 hours as neede for pain.  Orders: T-Drug Screen-Urine, (single) [phone]) MRI with & without Contrast (MRI w&w/o Contrast) Orthopedic Referral (Ortho)   I have a reviewed the previous records including hospital and ED records, radiographs and lab  values. Spent more than 35 minutes discussing the importance of medication compliance, managment of chronic diseases and lifestyle changes with the patient.   Complete Medication List: 1)  Cobal-1000 1000 Mcg/ml Inj Soln (Cyanocobalamin) .... Inject once daily once a month 2)  Bd Luer-lok Syringe 21g X 1-1/2" 3 Ml Misc (Syringe/needle (disp)) 3)  Oscal 500/200 D-3 500-200 Mg-unit Tabs (Calcium-vitamin d) .... Take 3 tablets a day. 4)  Pravachol 40 Mg Tabs (Pravastatin sodium) .... Take 1 tablet by mouth once a day at bedtime 5)  Neurontin 600 Mg Tabs (Gabapentin) .... Take 1 pill by mouth three times daily. 6)  Nexium 40 Mg Cpdr (Esomeprazole magnesium) .... Take one tab daily 7)  Cvs Stool Softener 100 Mg Caps (Docusate sodium) .... Once a day as needed for constipation 8)  Wellbutrin 100 Mg Tabs (Bupropion hcl) .... Take 1 tablet by mouth three times a day 9)  Proventil Hfa 108 (90 Base) Mcg/act Aers (Albuterol sulfate) .... 2 puffs as every 4-6 hours as needed for sob/wheeze 10)  Permethrin 5 % Crea (Permethrin) .... Apply from head to toe leave for 8-12 hours then rinse.  may reapply after 1 week if needed. 11)  Hydroxyzine Hcl 25 Mg Tabs (Hydroxyzine hcl) .... Take every 6 hours by mouth as needed for itching. 12)  Vicodin Es 7.5-750 Mg Tabs (Hydrocodone-acetaminophen) .... Take one tab every 4 hours as neede for pain.  Patient Instructions: 1)  Please schedule a follow-up appointment in 4-6 weeks with pcp  Prescriptions: VICODIN ES 7.5-750 MG TABS (HYDROCODONE-ACETAMINOPHEN) Take one tab every 4 hours as neede for pain.  #150 x 0   Entered and Authorized by:   Bethel Born MD   Signed by:   Bethel Born MD on 02/18/2010   Method used:   Print then Give to Patient   RxID:   [id number]    Orders Added: 1)  Est. Patient Level IV [14782] 2)  T-Drug Screen-Urine, (single) [[id number]] 3)  MRI with & without Contrast [MRI w&w/o Contrast] 4)  Orthopedic Referral  [Ortho]     Vital Signs:  Patient profile:   65 year old female Height:      64.4 inches (163.58 cm) Weight:      169.03 pounds (76.83 kg) BMI:     28.76 Temp:     98.2 degrees F (36.78 degrees C) oral Pulse rate:   99 / minute BP sitting:   103 / 61  (left arm) Cuff size:   regular  Vitals Entered By: Angelina Ok RN (February 18, 2010 11:03 AM)   Prevention & Chronic Care Immunizations   Influenza vaccine: Fluvax MCR  (11/28/2009)   Influenza vaccine deferral: Deferred  (07/26/2008)   Influenza vaccine due: 09/26/2008    Tetanus booster: 09/11/2009: Tdap (State)   Td booster deferral: Refused  (04/05/2009)    Pneumococcal vaccine: Not documented   Pneumococcal vaccine deferral: Not indicated  (03/29/2009)    H. zoster vaccine: Not documented   H. zoster vaccine deferral: Deferred  (07/26/2008)  Colorectal Screening   Hemoccult: Not documented   Hemoccult action/deferral: Not indicated  (04/05/2009)   Hemoccult due: 07/26/2009    Colonoscopy: Not documented   Colonoscopy action/deferral: GI referral  (01/29/2010)  Other Screening   Pap smear: Not documented   Pap smear action/deferral: Not indicated S/P hysterectomy  (09/10/2008)    Mammogram: ASSESSMENT: Negative - BI-RADS 1^MM DIGITAL SCREENING  (06/18/2009)   Mammogram action/deferral: Deferred  (04/05/2009)    DXA bone density scan: Not documented   DXA bone density action/deferral: Deferred  (04/05/2009)   Smoking status: current  (02/18/2010)   Smoking cessation counseling: yes  (02/18/2010)  Lipids   Total Cholesterol: 240  (03/29/2009)   Lipid panel action/deferral: Lipid Panel ordered   LDL: 152  (03/29/2009)   LDL Direct: Not documented   HDL: 64  (03/29/2009)   Triglycerides: 121  (03/29/2009)    SGOT (AST): 22  (02/25/2009)   SGPT (ALT): 15  (02/25/2009)   Alkaline phosphatase: 100  (02/25/2009)   Total bilirubin: 0.3  (02/25/2009)  Self-Management Support :   Personal Goals (by the  next clinic visit) :      Personal LDL goal: 100  (03/29/2009)    Patient will work on the following items until the next clinic visit to reach  self-care goals:     Medications and monitoring: take my medicines every day, bring all of my medications to every visit  (02/18/2010)     Eating: drink diet soda or water instead of juice or soda, eat more vegetables, use fresh or frozen vegetables, eat foods that are low in salt, eat baked foods instead of fried foods, eat fruit for snacks and desserts, limit or avoid alcohol  (02/18/2010)     Activity: take a 30 minute walk every day  (02/18/2010)    Lipid self-management support: Written self-care plan, Education handout, Pre-printed educational material, Resources for patients handout  (02/18/2010)   Lipid self-care plan printed.   Lipid education handout printed      Resource handout printed.   Process Orders Check Orders Results:     [facility]: Check successful Tests Sent for requisitioning (February 19, 2010 8:39 AM):     02/18/2010: [facility] -- T-Drug Screen-Urine, (single) [[id number]] (signed)

## 2010-02-27 NOTE — Progress Notes (Signed)
Summary: refill/gg  Phone Note Refill Request  on Jun 10, 2009 9:15 AM  Refills Requested: Medication #1:  PERCOCET 5-325 MG TABS Ta ke 1 tablet by mouth every 8 hours as needed for pain.   Last Refilled: 05/07/2009 Pt # [phone]   Method Requested: Pick up at Office Initial call taken by: Merrie Roof RN,  Jun 10, 2009 9:15 AM  Follow-up for Phone Call        Will write script now- pick up in attending office. Follow-up by: Julaine Fusi  DO,  Jun 13, 2009 2:07 PM  Additional Follow-up for Phone Call Additional follow up Details #1::        pt informed Rx ready Additional Follow-up by: Merrie Roof RN,  Jun 13, 2009 3:39 PM    Prescriptions: PERCOCET 5-325 MG TABS (OXYCODONE-ACETAMINOPHEN) Ta ke 1 tablet by mouth every 8 hours as needed for pain.  #60 x 0   Entered and Authorized by:   Julaine Fusi  DO   Signed by:   Julaine Fusi  DO on 06/13/2009   Method used:   Print then Give to Patient   RxID:   [id number] PERCOCET 5-325 MG TABS (OXYCODONE-ACETAMINOPHEN) Ta ke 1 tablet by mouth every 8 hours as needed for pain.  #60 x 0   Entered by:   Julaine Fusi  DO   Authorized by:   Merrie Roof RN   Signed by:   Julaine Fusi  DO on 06/13/2009   Method used:   Print then Give to Patient   RxID:   [id number]

## 2010-02-27 NOTE — Assessment & Plan Note (Signed)
Summary: shingles, R wrist, arm,shoulder,neck- PAINFUL/pcp-pokharel/hla   Vital Signs:  Patient profile:   64 year old female Height:      64.4 inches (163.58 cm) Weight:      160.6 pounds (73.00 kg) BMI:     27.32 Temp:     98.6 degrees F (37.00 degrees C) oral Pulse rate:   79 / minute BP sitting:   105 / 63  (left arm) Cuff size:   regular  Vitals Entered By: Krystal Eaton Duncan Dull) (March 29, 2009 10:15 AM) CC: f/u shingles rash on right arm, also here for lab work Is Patient Diabetic? No Nutritional Status BMI of 25 - 29 = overweight  Have you ever been in a relationship where you felt threatened, hurt or afraid?No   Does patient need assistance? Functional Status Self care Ambulation Normal   Primary Care Provider:  Jason Coop MD  CC:  f/u shingles rash on right arm and also here for lab work.  History of Present Illness: patient is 65 year old female current smoker with PMH as described in EMR is here today for rash on her right arm. It started 3 weeks ago, first at the arm and progressed to forearms. It was associated with pain, she was prescribed lidocaine which helped some but then started irritating. No fevers but she has chills at night and her arm feels hot. She complains that percocet is helping her tolerate the pain. She usually follow pain clinic for management of her pain meds but she does not have an appointment until April. No other complaint.   Preventive Screening-Counseling & Management  Alcohol-Tobacco     Alcohol drinks/day: 0     Smoking Status: current     Smoking Cessation Counseling: yes     Packs/Day: 5 cigs per day     Year Started: 1-2 cigs per month SOMETIME     Year Quit: 2009, 1 month  Problems Prior to Update: 1)  Oth&uns Sup Injr Oth Mx&uns Site w/o Mention Inf  (ICD-919.8) 2)  Hyperlipidemia  (ICD-272.4) 3)  Chronic Pain Syndrome  (ICD-338.4) 4)  Visual Acuity, Decreased, Left Eye  (ICD-369.9) 5)  Viral Uri   (ICD-465.9) 6)  Fracture, Arm, Right  (ICD-818.0) 7)  Shoulder Pain, Left  (ICD-719.41) 8)  Cellulitis and Abscess of Face  (ICD-682.0) 9)  Fatigue  (ICD-780.79) 10)  Depression  (ICD-311) 11)  Toe Pain  (ICD-729.5) 12)  Carcinoma, Basal Cell, Cheek, Left  (ICD-173.3) 13)  External Hemorrhoids  (ICD-455.3) 14)  Temporomandibular Joint Pain  (ICD-524.62) 15)  Hip Pain  (ICD-719.45) 16)  Back Pain  (ICD-724.5) 17)  Compression Fracture, Thoracic Vertebra  (ICD-805.2) 18)  Low Blood Pressure  (ICD-458.9) 19)  Diarrhea  (ICD-787.91) 20)  Tachycardia  (ICD-785.0) 21)  Osteoporosis  (ICD-733.00) 22)  Dysuria  (ICD-788.1) 23)  Health Maintenance Exam  (ICD-V70.0) 24)  Acute Osteomyelitis Other Specified Site  (ICD-730.08) 25)  Tobacco Abuse  (ICD-305.1) 26)  Vitamin B12 Deficiency  (ICD-266.2) 27)  Helicobacter Pylori Infection  (ICD-041.86) 28)  Acute Duodenal Ulcer W/hemorrhage&obstruction  (ICD-532.01) 29)  Anemia  (ICD-285.9)  Medications Prior to Update: 1)  Protonix 40 Mg  Tbec (Pantoprazole Sodium) .... Take 1 Tablet By Mouth Two Times A Day 2)  Cobal-1000 1000 Mcg/ml Inj Soln (Cyanocobalamin) .... Inject Once Daily Once A Month 3)  Bd Luer-Lok Syringe 21g X 1-1/2" 3 Ml  Misc (Syringe/needle (Disp)) 4)  Oscal 500/200 D-3 500-200 Mg-Unit  Tabs (Calcium-Vitamin D) .... Take 3  Tablets A Day. 5)  Anucort-Hc 25 Mg Supp (Hydrocortisone Acetate) .Marland Kitchen.. 1 Suppository Per Rectum 1-2 Times Per Day As Needed For Hemorrhoids 6)  Miralax  Pack (Polyethylene Glycol 3350) .... Take 1 Pack Once A Day. 7)  Evista 60 Mg Tabs (Raloxifene Hcl) .... Take 1 Tablet By Mouth Once A Day 8)  Prozac 40 Mg Caps (Fluoxetine Hcl) .... Take 1 Tablet By Mouth Once A Day 9)  Percocet 5-325 Mg Tabs (Oxycodone-Acetaminophen) .... Ta Ke 1 Tablet By Mouth Every 8 Hours As Needed For Pain. 10)  Flexeril 5 Mg Tabs (Cyclobenzaprine Hcl) .... Take 1 Tab By Mouth At Bedtime 11)  Zyrtec Allergy 10 Mg Caps (Cetirizine Hcl)  .... Take 1 Pill By Mouth Daily For 7 Days 12)  Pravachol 20 Mg Tabs (Pravastatin Sodium) .... Take 1 Tablet By Mouth Once A Day 13)  Neurontin 400 Mg Caps (Gabapentin) .... Take 1 Pill By Mouth Three Times A Day. 14)  Nicoderm Cq 21 Mg/24hr Pt24 (Nicotine) .... Upply 1 Patch Locally Daily. 15)  Bentyl 20 Mg Tabs (Dicyclomine Hcl) .... Take 1 Tablet By Mouth Three To Four Times A Day, Per Dr. Ewing Schlein.  Current Medications (verified): 1)  Protonix 40 Mg  Tbec (Pantoprazole Sodium) .... Take 1 Tablet By Mouth Two Times A Day 2)  Cobal-1000 1000 Mcg/ml Inj Soln (Cyanocobalamin) .... Inject Once Daily Once A Month 3)  Bd Luer-Lok Syringe 21g X 1-1/2" 3 Ml  Misc (Syringe/needle (Disp)) 4)  Oscal 500/200 D-3 500-200 Mg-Unit  Tabs (Calcium-Vitamin D) .... Take 3 Tablets A Day. 5)  Miralax  Pack (Polyethylene Glycol 3350) .... Take 1 Pack Once A Day. 6)  Prozac 40 Mg Caps (Fluoxetine Hcl) .... Take 1 Tablet By Mouth Once A Day 7)  Percocet 5-325 Mg Tabs (Oxycodone-Acetaminophen) .... Ta Ke 1 Tablet By Mouth Every 8 Hours As Needed For Pain. 8)  Pravachol 20 Mg Tabs (Pravastatin Sodium) .... Take 1 Tablet By Mouth Once A Day 9)  Neurontin 400 Mg Caps (Gabapentin) .... Take 1 Pill By Mouth Three Times A Day. 10)  Nicoderm Cq 21 Mg/24hr Pt24 (Nicotine) .... Upply 1 Patch Locally Daily. 11)  Bentyl 20 Mg Tabs (Dicyclomine Hcl) .... Take 1 Tablet By Mouth Three To Four Times A Day, Per Dr. Ewing Schlein. 12)  Amitriptyline Hcl 50 Mg Tabs (Amitriptyline Hcl) .... Once Daily For 5 Days and Then Increase It To 2 Pills A Day From Day 6 Onwards. 13)  Doxepin Hcl 100 Mg Caps (Doxepin Hcl) .... Take 1 Tablet By Mouth Two Times A Day  Allergies: 1)  ! Nsaids 2)  ! 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 Ewing Schlein    - f/u by gen. surg.    - expecting her to require bowel resection (i.e. when the sx recur) DUODENAL  ULCER with hemorrhage and obstruction 11/2006 MANDIBULAR OSTEOMYELITIS with recurrent chin abscess    - started 11/2006 while in hospital. Gram showed GNR and cx was negative.    - cx negative x 2 additional times in opc    - s/p debridement by Dr. Neoma Laming    - Grew S. Bovis - s/p 4 weeks of Pen V started 05/19/07 and additional 2 weeks started 07/04/07 DEGENERATIVE DISC DISEASE L5-S1 CHRONIC INTERSCAPULAR PAIN    - Mild compression of T12 superior endplate with a Schmorl's node - doc'd on CT scan 03/22/06 Depression  Family History: Last updated: 11/02/07 Father: died from an MI at 60yo, DM, Mother died of lung cancer Sister- DM  Social History: Last updated: November 02, 2007 Now lives by herself in an ALF where she can come and go as she pleases and where she can keep her dog, Ree Kida Criselda Peaches). Her good friend [name] is still looking out for her on a daily basis. Smokes about 4 cigarettes a week.   No EtOH   Risk Factors: Alcohol Use: 0 (03/29/2009) Caffeine Use: 0 (03/21/2009) Exercise: yes (03/21/2009)  Risk Factors: Smoking Status: current (03/29/2009) Packs/Day: 4 cigs/week (02/08/2007)  Social History: Packs/Day:  4 cigs/week  Review of Systems      See HPI  Physical Exam  Additional Exam:  Gen: AOx3, in no acute distress Eyes: PERRL, EOMI ENT:MMM, No erythema noted in posterior pharynx Neck: No JVD, No LAP Chest: CTAB with  good respiratory effort CVS: regular rhythmic rate, NO M/R/G, S1 S2 normal Abdo: soft,ND, BS+x4, Non tender and No hepatosplenomegaly EXT: edema noted on right arm and forearm and multiple maculopapular rashes noted in multiple stages including scabbing in multiple lesions. No infection noted. Neuro: Non focal, gait is normal Skin: no rashes noted.    Impression & Recommendations:  Problem # 1:  HERPES ZOSTER (ICD-053.9) Patient has shingles on her entire right arm which is in healing stage but she is still under a lot of pain. There is  no use of Valtrex at this point of time. e will prescribe her percocet for pain control and also give her Doxycycline for prophylaxis.  Problem # 2:  HYPERLIPIDEMIA (ICD-272.4) Well controlled. Her updated medication list for this problem includes:    Pravachol 20 Mg Tabs (Pravastatin sodium) .Marland Kitchen... Take 1 tablet by mouth once a day  Labs Reviewed: SGOT: 22 (02/25/2009)   SGPT: 15 (02/25/2009)   HDL:76 (10/26/2008)  LDL:205 (10/26/2008)  Chol:307 (10/26/2008)  Trig:132 (10/26/2008)  Problem # 3:  CELLULITIS AND ABSCESS OF FACE (ICD-682.0) Patient was being followed by Id for her infection and recieved a long course of antibiotic therapy. Healed at this point of time.  Problem # 4:  Preventive Health Care (ICD-V70.0) Patient not interested durng this visit due to excruciating pain.  Problem # 5:  TOBACCO ABUSE (ICD-305.1)  not interested at this point of time. Her updated medication list for this problem includes:    Nicoderm Cq 21 Mg/24hr Pt24 (Nicotine) ..... Upply 1 patch locally daily.  Encouraged smoking cessation and discussed different methods for smoking cessation.   Complete Medication List: 1)  Protonix 40 Mg Tbec (Pantoprazole sodium) .... Take 1 tablet by mouth two times a day 2)  Cobal-1000 1000 Mcg/ml Inj Soln (Cyanocobalamin) .... Inject once daily once a month 3)  Bd Luer-lok Syringe 21g X 1-1/2" 3 Ml Misc (Syringe/needle (disp)) 4)  Oscal 500/200 D-3 500-200 Mg-unit Tabs (Calcium-vitamin d) .... Take 3 tablets a day. 5)  Miralax Pack (Polyethylene glycol 3350) .... Take 1 pack once a day. 6)  Prozac 40 Mg Caps (Fluoxetine hcl) .... Take 1 tablet by mouth once a day 7)  Percocet 5-325 Mg Tabs (Oxycodone-acetaminophen) .... Ta ke 1 tablet by mouth every 8 hours as needed for pain. 8)  Pravachol 20 Mg Tabs (Pravastatin sodium) .... Take 1 tablet by mouth once a day 9)  Neurontin 400 Mg Caps (Gabapentin) .... Take 1 pill by mouth three times a day. 10)  Nicoderm Cq 21  Mg/24hr Pt24 (Nicotine) .... Upply 1 patch locally daily. 11)  Bentyl 20 Mg Tabs (Dicyclomine hcl) .... Take 1 tablet by mouth three to four times a day, per dr. Ewing Schlein. 12)  Amitriptyline Hcl 50 Mg Tabs (Amitriptyline hcl) .... Once daily for 5 days and then increase it to 2 pills a day from day 6 onwards. 13)  Doxepin Hcl 100 Mg Caps (Doxepin hcl) .... Take 1 tablet by mouth two times a day  Patient Instructions: 1)  Please schedule a follow-up appointment in 1 week. 2)  Tobacco is very bad for your health and your loved ones! You Should stop smoking!. 3)  Stop Smoking Tips: Choose a Quit date. Cut down before the Quit date. decide what you will do as a substitute when you feel the urge to smoke(gum,toothpick,exercise). 4)  It is important that you exercise regularly at least 20 minutes 5 times a week. If you develop chest pain, have severe difficulty breathing, or feel very tired , stop exercising immediately and seek medical attention. 5)  Take your antibiotic as prescribed until ALL of it is gone, but stop if you develop a rash or swelling and contact our office as soon as possible. Prescriptions: PERCOCET 5-325 MG TABS (OXYCODONE-ACETAMINOPHEN) Ta ke 1 tablet by mouth every 8 hours as needed for pain.  #60 x 0   Entered and Authorized by:   Lars Mage MD   Signed by:   Lars Mage MD on 03/29/2009   Method used:   Print then Give to Patient   RxID:   [id number] DOXEPIN HCL 100 MG CAPS (DOXEPIN HCL) Take 1 tablet by mouth two times a day  #14 x 0   Entered and Authorized by:   Lars Mage MD   Signed by:   Lars Mage MD on 03/29/2009   Method used:   Print then Give to Patient   RxID:   [id number] AMITRIPTYLINE HCL 50 MG TABS (AMITRIPTYLINE HCL) once daily for 5 days and then increase it to 2 pills a day from day 6 onwards.  #50 x 1   Entered and Authorized by:   Lars Mage MD   Signed by:   Lars Mage MD on 03/29/2009   Method used:   Print then Give to Patient   RxID:    [id number]   Prevention & Chronic Care Immunizations   Influenza vaccine: Fluvax MCR  (01/24/2009)   Influenza vaccine deferral: Deferred  (07/26/2008)   Influenza vaccine due: 09/26/2008    Tetanus booster: Not documented   Td booster deferral: Deferred  (07/26/2008)    Pneumococcal vaccine: Not documented   Pneumococcal vaccine deferral: Not indicated  (03/29/2009)    H. zoster vaccine: Not documented   H. zoster vaccine deferral: Deferred  (07/26/2008)  Colorectal Screening   Hemoccult: Not documented   Hemoccult action/deferral: Deferred  (03/29/2009)   Hemoccult due: 07/26/2009    Colonoscopy: Not documented   Colonoscopy action/deferral: Deferred  (03/29/2009)  Other Screening   Pap smear: Not documented   Pap smear action/deferral: Not indicated S/P hysterectomy  (09/10/2008)    Mammogram: Not documented   Mammogram action/deferral: Ordered  (09/10/2008)    DXA bone density scan: Not documented   DXA bone density action/deferral: Deferred  (09/10/2008)  Reports requested:   Last colonoscopy report requested.  Smoking status: current  (03/29/2009)   Smoking cessation counseling: yes  (03/29/2009)  Lipids   Total Cholesterol: 307  (10/26/2008)   Lipid panel action/deferral: Lipid Panel ordered   LDL: 205  (10/26/2008)   LDL Direct: Not documented  HDL: 76  (10/26/2008)   Triglycerides: 132  (10/26/2008)    SGOT (AST): 22  (02/25/2009)   SGPT (ALT): 15  (02/25/2009)   Alkaline phosphatase: 100  (02/25/2009)   Total bilirubin: 0.3  (02/25/2009)    Lipid flowsheet reviewed?: Yes   Progress toward LDL goal: Unchanged  Self-Management Support :   Personal Goals (by the next clinic visit) :      Personal LDL goal: 100  (03/29/2009)    Patient will work on the following items until the next clinic visit to reach self-care goals:     Medications and monitoring: take my medicines every day  (03/29/2009)     Eating: eat more vegetables, eat foods  that are low in salt, eat baked foods instead of fried foods  (03/29/2009)     Activity: join a walking program  (03/29/2009)    Lipid self-management support: Pre-printed educational material, Written self-care plan  (03/29/2009)   Lipid self-care plan printed.   Nursing Instructions: Request report of last colonoscopy

## 2010-02-27 NOTE — Progress Notes (Signed)
Summary: refill/ hla  Phone Note Refill Request Message from:  Patient on May 06, 2009 11:01 AM  Refills Requested: Medication #1:  PERCOCET 5-325 MG TABS Ta ke 1 tablet by mouth every 8 hours as needed for pain.   Last Refilled: 3/4 please call when ready  [phone]  Initial call taken by: Marin Roberts RN,  May 06, 2009 11:02 AM  Follow-up for Phone Call       Follow-up by: Jason Coop MD,  May 07, 2009 1:56 PM    Prescriptions: PERCOCET 5-325 MG TABS (OXYCODONE-ACETAMINOPHEN) Ta ke 1 tablet by mouth every 8 hours as needed for pain.  #60 x 0   Entered and Authorized by:   Jason Coop MD   Signed by:   Jason Coop MD on 05/07/2009   Method used:   Print then Give to Patient   RxID:   [id number]

## 2010-02-27 NOTE — Progress Notes (Signed)
Summary: REfill/gh  Phone Note Refill Request Message from:  Patient on August 14, 2009 2:21 PM  Refills Requested: Medication #1:  PERCOCET 5-325 MG TABS Ta ke 1 tablet by mouth every 8 hours as needed for pain.  Method Requested: Pick up at Office Initial call taken by: Angelina Ok RN,  August 14, 2009 2:21 PM  Follow-up for Phone Call        Rx picked -up by pt. Follow-up by: Chinita Pester RN,  August 14, 2009 4:29 PM    Prescriptions: PERCOCET 5-325 MG TABS (OXYCODONE-ACETAMINOPHEN) Ta ke 1 tablet by mouth every 8 hours as needed for pain.  #60 x 0   Entered and Authorized by:   Zoila Shutter MD   Signed by:   Zoila Shutter MD on 08/14/2009   Method used:   Print then Give to Patient   RxID:   [id number]

## 2010-02-27 NOTE — Assessment & Plan Note (Signed)
Summary: EST-1 MONTH F/U VISIT/CH   Vital Signs:  Patient profile:   65 year old female Height:      64.4 inches Weight:      164.04 pounds BMI:     27.91 O2 Sat:      98 % on Room air Temp:     96.9 degrees F oral Pulse rate:   98 / minute BP sitting:   120 / 71  (left arm) Cuff size:   regular  Vitals Entered By: Angelina Ok RN (January 08, 2010 1:43 PM)  O2 Flow:  Room air CC: Depression Is Patient Diabetic? No Pain Assessment Patient in pain? yes     Location: legs and feet. Intensity: 10 Type: stinging burning aching Onset of pain  Constant Nutritional Status BMI of 25 - 29 = overweight  Does patient need assistance? Ambulation Impaired:Risk for fall Comments Using a cane.  Coughing makes the bottomof her feet hurt.  Burining and stinging above knees on both legs.  Started out as a stinging now a burning.  Pain med is not helping.  Problems walking when she gets up in the am.  Needs an inhaler refill.   Primary Care Lonny Eisen:  Sinda Du MD  CC:  Depression.  History of Present Illness: This is a 65 year old woman with a history of depression, superior mesenteric artery ischemia 2008, and lumbar degenerative joint disease who presents for 3 week follow up for depression. Pt currently on wellbutrin three times a day and has had improvement of her depression symptoms but still feels depressed because of her pain.  Pt states that starting 3 weeks ago she developed a burning sensation in her upper thighs that causes pain when she walks.  Pt also experiences pain in her knees and down her anterior shins.  Last week, Pt also had some bilateral edema in her legs and went to urgent care where a left ankle x ray was done for swelling and was negative.  Pts rash has now completely resolved after one dose of permethrin.  Pt continues to have SOB with exertion but denies any CP, palpitations or current LE edema.  Pt is requesting vicodin instead of her oxycontin for her leg  pain.       Depression History:      The patient is having a depressed mood most of the day and has a diminished interest in her usual daily activities.        The patient denies that she feels like life is not worth living, denies that she wishes that she were dead, and denies that she has thought about ending her life.        Comments:  On meds for depression.  .   Preventive Screening-Counseling & Management  Alcohol-Tobacco     Alcohol drinks/day: 0     Smoking Status: current     Smoking Cessation Counseling: yes     Packs/Day: 0.5     Year Started: 1-2 cigs per month SOMETIME     Year Quit: 2009, 1 month  Allergies: 1)  ! Nsaids 2)  ! 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 Ewing Schlein    - f/u by gen. surg.    - expecting her to require bowel resection (i.e. when the sx recur) DUODENAL ULCER with hemorrhage and obstruction 11/2006 MANDIBULAR OSTEOMYELITIS with recurrent chin abscess    -  started 11/2006 while in hospital. Gram showed GNR and cx was negative.    - cx negative x 2 additional times in opc    - s/p debridement by Dr. Neoma Laming    - Grew S. Bovis - s/p 4 weeks of Pen V started 05/19/07 and additional 2 weeks started 07/04/07 DEGENERATIVE DISC DISEASE L5-S1 CHRONIC INTERSCAPULAR PAIN    - Mild compression of T12 superior endplate with a Schmorl's node - doc'd on CT scan 03/22/06 Depression  Family History: Reviewed history from 10/26/2007 and no changes required. Father: died from an MI at 61yo, DM, Mother died of lung cancer Sister- DM  Social History: Reviewed history from 11/28/2009 and no changes required. Now lives by herself in an ALF ([facility]) where she can come and go as she pleases. a Her good friend [name] is still looking out for her on a daily basis. Smokes about 1 pack  daily.   No EtOH   Review of Systems       Pt denies any  fever, chills, syncope, anorexia, or change in her BM's.  Physical Exam  General:  alert and well-developed.   Eyes:  vision grossly intact, pupils equal, pupils round, and pupils reactive to light.   Nose:  no nasal discharge.   Mouth:  pharynx pink and moist.   Neck:  supple.   Lungs:  normal respiratory effort, normal breath sounds, no crackles, and no wheezes.   Heart:  normal rate, regular rhythm, no murmur, no gallop, and no rub.   Abdomen:  soft, non-tender, normal bowel sounds, and no distention.   Msk:  Diffuse paraspinal muscle tenderness. Pts legs are not tender to palpation, there is no joint tenderness.  Pulses:  2+ dp/pt pulses Extremities:  no Edema.  Neurologic:  Musle strength 5/5 throughout.  Sensation intact throughout, patellar reflexes 2+ and equal.  Skin:  Healing skin rash primarily on the legs (scabs) with no new lesions.    Impression & Recommendations:  Problem # 1:  DEPRESSION (ICD-311) Will continue wellbutren at the current dose.   Her updated medication list for this problem includes:    Wellbutrin 100 Mg Tabs (Bupropion hcl) .Marland Kitchen... Take 1 tablet by mouth three times a day    Hydroxyzine Hcl 25 Mg Tabs (Hydroxyzine hcl) .Marland Kitchen... Take every 6 hours by mouth as needed for itching.  Problem # 2:  Leg Pain This is likely secondary to lumbar nerve entrapment although there are no physical exam findings at this point.  Given that vicodin helps more than oxycontin I will dc oxycontin and start vicodin.  Will reassess in two weeks.  Of note, pt was not given printed prescription for 300mg  neurontin.   Problem # 3:  ? of COPD (ICD-496) Will refill albuterol today but will get PFT's to provide more information on what is causing the pts exertional SOB.  COPD is most likely given the pts past hx of smoking.   Her updated medication list for this problem includes:    Proventil Hfa 108 (90 Base) Mcg/act Aers (Albuterol sulfate) .Marland Kitchen... 2 puffs as every 4-6 hours as  needed for sob/wheeze  Orders: PFT Baseline-Pre/Post Bronchodiolator (PFT Baseline-Pre/Pos)  Problem # 4:  Scabies Resolved with permethrin.   Complete Medication List: 1)  Cobal-1000 1000 Mcg/ml Inj Soln (Cyanocobalamin) .... Inject once daily once a month 2)  Bd Luer-lok Syringe 21g X 1-1/2" 3 Ml Misc (Syringe/needle (disp)) 3)  Oscal 500/200 D-3 500-200 Mg-unit Tabs (Calcium-vitamin d) .... Take  3 tablets a day. 4)  Pravachol 40 Mg Tabs (Pravastatin sodium) .... Take 1 tablet by mouth once a day at bedtime 5)  Neurontin 600 Mg Tabs (Gabapentin) .... Take 1 pill by mouth three times daily. 6)  Nexium 40 Mg Cpdr (Esomeprazole magnesium) .... Take one tab daily 7)  Cvs Stool Softener 100 Mg Caps (Docusate sodium) .... Once a day as needed for constipation 8)  Wellbutrin 100 Mg Tabs (Bupropion hcl) .... Take 1 tablet by mouth three times a day 9)  Proventil Hfa 108 (90 Base) Mcg/act Aers (Albuterol sulfate) .... 2 puffs as every 4-6 hours as needed for sob/wheeze 10)  Permethrin 5 % Crea (Permethrin) .... Apply from head to toe leave for 8-12 hours then rinse.  may reapply after 1 week if needed. 11)  Hydroxyzine Hcl 25 Mg Tabs (Hydroxyzine hcl) .... Take every 6 hours by mouth as needed for itching. 12)  Vicodin 5-500 Mg Tabs (Hydrocodone-acetaminophen) .... Take 1 tab up to every 4 hours as needed for pain.  Patient Instructions: 1)  Please schedule a follow-up appointment in 2 weeks. 2)  Please take your gabapentin as directed for your leg pain.  I will be scheduling your for pulmonary function tests.  Prescriptions: VICODIN 5-500 MG TABS (HYDROCODONE-ACETAMINOPHEN) Take 1 tab up to every 4 hours as needed for pain.  #60 x 0   Entered and Authorized by:   Sinda Du MD   Signed by:   Sinda Du MD on 01/08/2010   Method used:   Print then Give to Patient   RxID:   [id number] PROVENTIL HFA 108 (90 BASE) MCG/ACT AERS (ALBUTEROL SULFATE) 2 puffs as every 4-6 hours as needed  for SOB/wheeze  #1 x 0   Entered and Authorized by:   Sinda Du MD   Signed by:   Sinda Du MD on 01/08/2010   Method used:   Print then Give to Patient   RxID:   [id number] GABAPENTIN 300 MG CAPS (GABAPENTIN) Take 1 tab twice daily for 3 days then  increase to 1 tab three times daily.  #30 x 3   Entered and Authorized by:   Sinda Du MD   Signed by:   Sinda Du MD on 01/08/2010   Method used:   Print then Give to Patient   RxID:   [id number]    Orders Added: 1)  PFT Baseline-Pre/Post Bronchodiolator [PFT Baseline-Pre/Pos] 2)  Est. Patient Level IV [01027]    Prevention & Chronic Care Immunizations   Influenza vaccine: Fluvax MCR  (11/28/2009)   Influenza vaccine deferral: Deferred  (07/26/2008)   Influenza vaccine due: 09/26/2008    Tetanus booster: 09/11/2009: Tdap (State)   Td booster deferral: Refused  (04/05/2009)    Pneumococcal vaccine: Not documented   Pneumococcal vaccine deferral: Not indicated  (03/29/2009)    H. zoster vaccine: Not documented   H. zoster vaccine deferral: Deferred  (07/26/2008)  Colorectal Screening   Hemoccult: Not documented   Hemoccult action/deferral: Not indicated  (04/05/2009)   Hemoccult due: 07/26/2009    Colonoscopy: Not documented   Colonoscopy action/deferral: Deferred  (04/05/2009)  Other Screening   Pap smear: Not documented   Pap smear action/deferral: Not indicated S/P hysterectomy  (09/10/2008)    Mammogram: ASSESSMENT: Negative - BI-RADS 1^MM DIGITAL SCREENING  (06/18/2009)   Mammogram action/deferral: Deferred  (04/05/2009)    DXA bone density scan: Not documented   DXA bone density action/deferral: Deferred  (04/05/2009)   Smoking status: current  (01/08/2010)  Smoking cessation counseling: yes  (01/08/2010)  Lipids   Total Cholesterol: 240  (03/29/2009)   Lipid panel action/deferral: Lipid Panel ordered   LDL: 152  (03/29/2009)   LDL Direct: Not documented   HDL: 64   (03/29/2009)   Triglycerides: 121  (03/29/2009)    SGOT (AST): 22  (02/25/2009)   SGPT (ALT): 15  (02/25/2009)   Alkaline phosphatase: 100  (02/25/2009)   Total bilirubin: 0.3  (02/25/2009)  Self-Management Support :   Personal Goals (by the next clinic visit) :      Personal LDL goal: 100  (03/29/2009)    Patient will work on the following items until the next clinic visit to reach self-care goals:     Medications and monitoring: take my medicines every day, bring all of my medications to every visit  (01/08/2010)     Eating: drink diet soda or water instead of juice or soda, eat more vegetables, use fresh or frozen vegetables, eat foods that are low in salt, eat baked foods instead of fried foods, eat fruit for snacks and desserts, limit or avoid alcohol  (01/08/2010)     Activity: take a 30 minute walk every day  (01/08/2010)    Lipid self-management support: Written self-care plan, Education handout, Pre-printed educational material, Resources for patients handout  (01/08/2010)   Lipid self-care plan printed.   Lipid education handout printed      Resource handout printed.    Vital Signs:  Patient profile:   65 year old female Height:      64.4 inches Weight:      164.04 pounds BMI:     27.91 O2 Sat:      98 % Temp:     96.9 degrees F oral Pulse rate:   98 / minute BP sitting:   120 / 71  (left arm) Cuff size:   regular  Vitals Entered By: Angelina Ok RN (January 08, 2010 1:43 PM)  O2 Flow:  Room air

## 2010-02-27 NOTE — Assessment & Plan Note (Signed)
Summary: FU/SB.   Vital Signs:  Patient profile:   65 year old female Height:      64.4 inches (163.58 cm) Weight:      164.7 pounds (74.86 kg) BMI:     28.02 Temp:     97.3 degrees F (36.28 degrees C) oral Pulse rate:   100 / minute BP sitting:   122 / 53  (left arm) Cuff size:   regular  Vitals Entered By: Theotis Barrio NT II (January 29, 2010 2:43 PM) CC: PAIN  MED REFILL / BILATERAL LEG PAIN   # 6  /  Is Patient Diabetic? No Pain Assessment Patient in pain? yes     Location: LEGS Intensity:             6 Type: ALL Onset of pain      FOR ABOUT 2 MONTHS Nutritional Status BMI of 25 - 29 = overweight  Have you ever been in a relationship where you felt threatened, hurt or afraid?No   Does patient need assistance? Functional Status Self care Ambulation Normal   Primary Care Provider:  Sinda Du MD  CC:  PAIN  MED REFILL / BILATERAL LEG PAIN   # 6  / .  History of Present Illness: This is a 65 year old female with a history of depression. GERD, hx of superior mesenteric artery ischemia in 2008 who presents for 1 month follow up.  At pts last visit her primary concern was severe leg pain that is primarily in the anterior thigh but shoots behind her knees.  Pt has known herniated disks base on prior MRI.  Pts pain is greatly improved from prior but continues.  Pt was changed from oxycontin 10mg  to vicoden 5-500 because she stated that it helped her leg pain more.  Unfortunatly, this has lead to increased back pain as it is significantly less narcotic. Pain  in her legs is sharp, 5/10 and only occurs when she walks.  Pt has a questionable hx of COPD and at her last visit I scheduled her for PFT's but she missed the appointment.  Pt has not used her albuterol since her last appointment because she states that she felt that I was disappointed in her for using it as often as she had.  At this point, pts depression is greatly improved and her disposition quite pleasant.    Depression History:      The patient denies a depressed mood most of the day and a diminished interest in her usual daily activities.         Preventive Screening-Counseling & Management  Alcohol-Tobacco     Alcohol drinks/day: 0     Smoking Status: current     Smoking Cessation Counseling: yes     Packs/Day: 0.5     Year Started: 1-2 cigs per month SOMETIME     Year Quit: 2009, 1 month  Caffeine-Diet-Exercise     Caffeine use/day: 0     Does Patient Exercise: yes     Type of exercise: WALKING THE DOG     Times/week: 7  Allergies: 1)  ! Nsaids 2)  ! 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 Ewing Schlein    - f/u by gen. surg.    - expecting her to require bowel resection (i.e. when the sx recur) DUODENAL ULCER with hemorrhage and obstruction 11/2006 MANDIBULAR OSTEOMYELITIS with recurrent chin  abscess    - started 11/2006 while in hospital. Gram showed GNR and cx was negative.    - cx negative x 2 additional times in opc    - s/p debridement by Dr. Neoma Laming    - Grew S. Bovis - s/p 4 weeks of Pen V started 05/19/07 and additional 2 weeks started 07/04/07 DEGENERATIVE DISC DISEASE L5-S1 CHRONIC INTERSCAPULAR PAIN    - Mild compression of T12 superior endplate with a Schmorl's node - doc'd on CT scan 03/22/06 Depression  Family History: Reviewed history from 10/26/2007 and no changes required. Father: died from an MI at 11yo, DM, Mother died of lung cancer Sister- DM  Social History: Reviewed history from 11/28/2009 and no changes required. Now lives by herself in an ALF ([facility]) where she can come and go as she pleases. a Her good friend [name] is still looking out for her on a daily basis. Smokes about 1 pack  daily.   No EtOH   Review of Systems       Pt denies any anorexia, weight change, fever, chills, SI or HI.   Physical Exam  General:  alert and  well-developed.   Head:  normocephalic and atraumatic.   Eyes:  vision grossly intact, pupils equal, pupils round, and pupils reactive to light.   Neck:  supple.   Lungs:  normal respiratory effort, normal breath sounds, no crackles, and no wheezes.   Heart:  normal rate, regular rhythm, no murmur, no gallop, and no rub.   Abdomen:  soft, non-tender, normal bowel sounds, and no distention.   Msk:  normal ROM, no joint tenderness, no joint swelling, and no joint warmth.  Pt has normal strength in her LE   Impression & Recommendations:  Problem # 1:  CHRONIC PAIN SYNDROME (ICD-338.4) Pt has chronic back pain for which she has taken oxycodone, oxycontin, and vicodin.  Pain is secondary to lumbar disk herniation.  Pt complains of continued but significantly improved leg pain today.  That being said, decreasing her narcotic dose at her last visit has lead to worsening back pain.  As such I did increase her vicodin to the 7.5 mg dose today ast this is still significantly less than the oxycontin she was previously taking.  Pt does not have any warning symptoms at this time and her neurologic exam is normal.  As such I will continue to monitor.  Problem # 2:  ? of COPD (ICD-496) Pt never filled albuterol after last visit.  I instructed the pt fill and use as needed but to call if having to use it frequently.  Pt was rescheduled for PFTs today.   Her updated medication list for this problem includes:     Proventil Hfa 108 (90 Base) Mcg/act Aers (Albuterol sulfate) .Marland Kitchen... 2 puffs as every 4-6 hours as needed for sob/wheeze  Orders: PFT Baseline-Pre/Post Bronchodiolator (PFT Baseline-Pre/Pos)  Problem # 3:  DEPRESSION (ICD-311) Greatly improved.   PTs mood is good, continue current meds.   Her updated medication list for this problem includes:    Wellbutrin 100 Mg Tabs (Bupropion hcl) .Marland Kitchen... Take 1 tablet by mouth three times a day    Hydroxyzine Hcl 25 Mg Tabs (Hydroxyzine hcl) .Marland Kitchen... Take every 6  hours by mouth as needed for itching.  Problem # 4:  CARCINOMA, BASAL CELL, CHEEK, LEFT (ICD-173.3) Resolved surgical site well healed.   Problem # 5:  PREVENTIVE HEALTH CARE (ICD-V70.0) Illa Level is her GI doctor and she stated that  she would like to call him for a colonoscopy.   Complete Medication List: 1)  Cobal-1000 1000 Mcg/ml Inj Soln (Cyanocobalamin) .... Inject once daily once a month 2)  Bd Luer-lok Syringe 21g X 1-1/2" 3 Ml Misc (Syringe/needle (disp)) 3)  Oscal 500/200 D-3 500-200 Mg-unit Tabs (Calcium-vitamin d) .... Take 3 tablets a day. 4)  Pravachol 40 Mg Tabs (Pravastatin sodium) .... Take 1 tablet by mouth once a day at bedtime 5)  Neurontin 600 Mg Tabs (Gabapentin) .... Take 1 pill by mouth three times daily. 6)  Nexium 40 Mg Cpdr (Esomeprazole magnesium) .... Take one tab daily 7)  Cvs Stool Softener 100 Mg Caps (Docusate sodium) .... Once a day as needed for constipation 8)  Wellbutrin 100 Mg Tabs (Bupropion hcl) .... Take 1 tablet by mouth three times a day 9)  Proventil Hfa 108 (90 Base) Mcg/act Aers (Albuterol sulfate) .... 2 puffs as every 4-6 hours as needed for sob/wheeze 10)  Permethrin 5 % Crea (Permethrin) .... Apply from head to toe leave for 8-12 hours then rinse.  may reapply after 1 week if needed. 11)  Hydroxyzine Hcl 25 Mg Tabs (Hydroxyzine hcl) .... Take every 6 hours by mouth as needed for itching. 12)  Vicodin Es 7.5-750 Mg Tabs (Hydrocodone-acetaminophen) .... Take one tab every 4 hours as neede for pain.  Patient Instructions: 1)  Please call the clinic if you have any problems.  I will see you again in 3 months.  Prescriptions: VICODIN ES 7.5-750 MG TABS (HYDROCODONE-ACETAMINOPHEN) Take one tab every 4 hours as neede for pain.  #60 x 0   Entered and Authorized by:   Sinda Du MD   Signed by:   Sinda Du MD on 01/29/2010   Method used:   Print then Give to Patient   RxID:   [id number]    Orders Added: 1)  Est. Patient Level III  [01027] 2)  PFT Baseline-Pre/Post Bronchodiolator [PFT Baseline-Pre/Pos]     Prevention & Chronic Care Immunizations   Influenza vaccine: Fluvax MCR  (11/28/2009)   Influenza vaccine deferral: Deferred  (07/26/2008)   Influenza vaccine due: 09/26/2008    Tetanus booster: 09/11/2009: Tdap (State)   Td booster deferral: Refused  (04/05/2009)    Pneumococcal vaccine: Not documented   Pneumococcal vaccine deferral: Not indicated  (03/29/2009)    H. zoster vaccine: Not documented   H. zoster vaccine deferral: Deferred  (07/26/2008)  Colorectal Screening   Hemoccult: Not documented   Hemoccult action/deferral: Not indicated  (04/05/2009)   Hemoccult due: 07/26/2009    Colonoscopy: Not documented   Colonoscopy action/deferral: GI referral  (01/29/2010)  Other Screening   Pap smear: Not documented   Pap smear action/deferral: Not indicated S/P hysterectomy  (09/10/2008)    Mammogram: ASSESSMENT: Negative - BI-RADS 1^MM DIGITAL SCREENING  (06/18/2009)   Mammogram action/deferral: Deferred  (04/05/2009)    DXA bone density scan: Not documented   DXA bone density action/deferral: Deferred  (04/05/2009)   Smoking status: current  (01/29/2010)   Smoking cessation counseling: yes  (01/29/2010)  Lipids   Total Cholesterol: 240  (03/29/2009)   Lipid panel action/deferral: Lipid Panel ordered   LDL: 152  (03/29/2009)   LDL Direct: Not documented   HDL: 64  (03/29/2009)   Triglycerides: 121  (03/29/2009)    SGOT (AST): 22  (02/25/2009)   SGPT (ALT): 15  (02/25/2009)   Alkaline phosphatase: 100  (02/25/2009)   Total bilirubin: 0.3  (02/25/2009)  Self-Management Support :   Personal Goals (  by the next clinic visit) :      Personal LDL goal: 100  (03/29/2009)    Patient will work on the following items until the next clinic visit to reach self-care goals:     Medications and monitoring: take my medicines every day, bring all of my medications to every visit  (01/29/2010)      Eating: drink diet soda or water instead of juice or soda, eat more vegetables, use fresh or frozen vegetables, eat foods that are low in salt, eat baked foods instead of fried foods, eat fruit for snacks and desserts, limit or avoid alcohol  (01/29/2010)     Activity: take a 30 minute walk every day  (01/29/2010)    Lipid self-management support: Resources for patients handout  (01/29/2010)        Resource handout printed.   Nursing Instructions: GI referral for screening colonoscopy (see order)

## 2010-02-27 NOTE — Medication Information (Signed)
Summary: PERCOCET  PERCOCET   Imported By: Margie Billet 11/14/2009 11:51:48  _____________________________________________________________________  External Attachment:    Type:   Image     Comment:   External Document

## 2010-03-10 ENCOUNTER — Other Ambulatory Visit: Payer: Self-pay | Admitting: Ophthalmology

## 2010-03-19 ENCOUNTER — Other Ambulatory Visit: Payer: Self-pay | Admitting: *Deleted

## 2010-03-19 NOTE — Telephone Encounter (Signed)
Last refill 1/24

## 2010-03-20 ENCOUNTER — Encounter: Payer: Self-pay | Admitting: Ophthalmology

## 2010-03-20 MED ORDER — HYDROCODONE-ACETAMINOPHEN 7.5-750 MG PO TABS: 1.0000 | ORAL_TABLET | ORAL | Status: DC | PRN

## 2010-03-20 NOTE — Telephone Encounter (Signed)
Rx for vicodin #150 called in to pharmacy.   Rx for vicodin #30 was written in error

## 2010-03-28 ENCOUNTER — Other Ambulatory Visit: Payer: Self-pay | Admitting: Ophthalmology

## 2010-04-16 ENCOUNTER — Other Ambulatory Visit: Payer: Self-pay | Admitting: Internal Medicine

## 2010-04-17 NOTE — Telephone Encounter (Signed)
Rx called in 

## 2010-05-03 ENCOUNTER — Other Ambulatory Visit: Payer: Self-pay | Admitting: Ophthalmology

## 2010-05-03 ENCOUNTER — Other Ambulatory Visit: Payer: Self-pay | Admitting: Internal Medicine

## 2010-05-03 LAB — POCT HEMOGLOBIN-HEMACUE: Hemoglobin: 15.2 g/dL — ABNORMAL HIGH (ref 12.0–15.0)

## 2010-05-04 LAB — BASIC METABOLIC PANEL
BUN: 12 mg/dL (ref 6–23)
CO2: 26 mEq/L (ref 19–32)
Calcium: 9.9 mg/dL (ref 8.4–10.5)
Chloride: 103 mEq/L (ref 96–112)
Creatinine, Ser: 0.79 mg/dL (ref 0.4–1.2)
GFR calc Af Amer: >60 mL/min (ref >60)
GFR calc non Af Amer: >60 mL/min (ref >60)
Glucose, Bld: 80 mg/dL (ref 70–99)
Potassium: 4.1 mEq/L (ref 3.5–5.1)
Sodium: 140 mEq/L (ref 135–145)

## 2010-05-04 LAB — TISSUE CULTURE

## 2010-05-04 LAB — CBC
HCT: 48.5 % — ABNORMAL HIGH (ref 36.0–46.0)
Hemoglobin: 16.4 g/dL — ABNORMAL HIGH (ref 12.0–15.0)
MCHC: 33.8 g/dL (ref 30.0–36.0)
MCV: 92.2 fL (ref 78.0–100.0)
Platelets: 232 10*3/uL (ref 150–400)
RBC: 5.26 MIL/uL — ABNORMAL HIGH (ref 3.87–5.11)
RDW: 14.7 % (ref 11.5–15.5)
WBC: 7.2 10*3/uL (ref 4.0–10.5)

## 2010-05-04 LAB — GRAM STAIN: Gram Stain: NONE SEEN

## 2010-05-04 LAB — ANAEROBIC CULTURE

## 2010-05-05 NOTE — Telephone Encounter (Signed)
Pt is on chronic narcs. Last seen 1/11. No contract found .Needs appt with PCP.

## 2010-05-06 LAB — CBC
HCT: 42.2 % (ref 36.0–46.0)
Hemoglobin: 14.5 g/dL (ref 12.0–15.0)
MCHC: 34.3 g/dL (ref 30.0–36.0)
MCV: 90.8 fL (ref 78.0–100.0)
Platelets: 175 10*3/uL (ref 150–400)
RBC: 4.65 MIL/uL (ref 3.87–5.11)
RDW: 14 % (ref 11.5–15.5)
WBC: 5.7 10*3/uL (ref 4.0–10.5)

## 2010-05-06 LAB — DIFFERENTIAL
Basophils Absolute: 0 10*3/uL (ref 0.0–0.1)
Basophils Relative: 0 % (ref 0–1)
Eosinophils Absolute: 0 10*3/uL (ref 0.0–0.7)
Eosinophils Relative: 0 % (ref 0–5)
Lymphocytes Relative: 26 % (ref 12–46)
Lymphs Abs: 1.5 10*3/uL (ref 0.7–4.0)
Monocytes Absolute: 0.5 10*3/uL (ref 0.1–1.0)
Monocytes Relative: 8 % (ref 3–12)
Neutro Abs: 3.7 10*3/uL (ref 1.7–7.7)
Neutrophils Relative %: 66 % (ref 43–77)

## 2010-05-12 LAB — COMPREHENSIVE METABOLIC PANEL
ALT: 13 U/L (ref 0–35)
AST: 21 U/L (ref 0–37)
Albumin: 4.4 g/dL (ref 3.5–5.2)
Alkaline Phosphatase: 118 U/L — ABNORMAL HIGH (ref 39–117)
GFR calc Af Amer: >60 mL/min (ref >60)
Potassium: 4 mEq/L (ref 3.5–5.1)
Sodium: 142 mEq/L (ref 135–145)
Total Protein: 7.2 g/dL (ref 6.0–8.3)

## 2010-05-12 LAB — DIFFERENTIAL
Basophils Relative: 1 % (ref 0–1)
Eosinophils Absolute: 0 10*3/uL (ref 0.0–0.7)
Eosinophils Relative: 0 % (ref 0–5)
Monocytes Absolute: 0.4 10*3/uL (ref 0.1–1.0)
Monocytes Relative: 6 % (ref 3–12)
Neutro Abs: 4.3 10*3/uL (ref 1.7–7.7)

## 2010-05-12 LAB — CBC
Platelets: 255 10*3/uL (ref 150–400)
RDW: 13.8 % (ref 11.5–15.5)
WBC: 7.9 10*3/uL (ref 4.0–10.5)

## 2010-05-15 ENCOUNTER — Other Ambulatory Visit: Payer: Self-pay | Admitting: Ophthalmology

## 2010-05-16 ENCOUNTER — Other Ambulatory Visit: Payer: Self-pay | Admitting: Ophthalmology

## 2010-05-16 NOTE — Telephone Encounter (Signed)
Called to pharm 

## 2010-06-10 NOTE — Consult Note (Signed)
NAMESION, THANE NO.:  [id number]   MEDICAL RECORD NO.:  [id number]          PATIENT TYPE:  INP   LOCATION:  5737                         FACILITY:  [facility]   PHYSICIAN:  Bernette Redbird, M.D.   DATE OF BIRTH:  Oct 15, 1945   DATE OF CONSULTATION:  12/10/2006  DATE OF DISCHARGE:                                 CONSULTATION   REASON FOR CONSULTATION:  We were asked to see Ms. Korn today in  consultation for nausea and vomiting with hematemesis by Dr. Raenette Rover.   HISTORY OF PRESENT ILLNESS:  This is a 65 year old female with a recent  history in September of a large duodenal ulcer.  She is also known to  have a small bowel abnormality on CT.  She is currently complaining of  nausea and vomiting x 1 week with some episodes of minimal amounts of  hematemesis.  She tells me that yesterday she vomited 7 times and there  was no blood.  Second, she is complaining of constipation, but also  tells me that she has leaky diarrhea and is incontinent of bowel.  She  says when she does have a bowel movement, it is very hard to pass and  small black pellets.  She has seen no blood in her bowel movements.  She  tells me that she has hemorrhoidal discomfort and that she has  experienced weight loss of 8 pounds in the last several weeks.  She was  108 and dropped down to 100 pounds.  Yesterday, she was found to have  bronchitis by her primary care physician, Dr. Ardyth Harps.  Her throat has  been very sore and she has a chin lesion that she says has been painful  for approximately 1 month and has leaked pus and blood within the last  week.  The patient denies anorexia.  Says that when she is not vomiting,  she actually is hungry.  She has had no sick contacts, melena or bright  red blood per rectum.   PAST MEDICAL HISTORY:  1. Significant for a large duodenal ulcer in September secondary to      NSAIDs.  2. Tobacco abuse.  3. Chronic back pain.  4. She reports that  she has had a small bowel obstruction with an open      surgical repair.  5. C. section.  6. Ovarian cyst surgery.  7. Fractured sacrum.  8. She reports that she had a colonoscopy done 100 years ago and      does not remember the results.   GI PHYSICIAN:  Charlott Rakes, MD.   PRIMARY CARE PHYSICIAN:  Peggye Pitt, M.D.   ALLERGIES:  NO KNOWN DRUG ALLERGIES.   CURRENT MEDICATIONS:  1. Omeprazole.  2. Darvocet.  3. Chantix.  4. Vitamin B12 injections.   SOCIAL HISTORY:  Positive for tobacco abuse for 45 years.  No alcohol or  drug use.  On her last hospitalization, she told me she was homeless.  When the hospitalization was over, she went to stay with a friend and is  currently still staying at that  friend's house.   FAMILY HISTORY:  Unknown to the patient from a GI perspective.   REVIEW OF SYSTEMS:  Significant for headache, weakness, sore throat,  chin and jaw pain, and polydipsia.   PHYSICAL EXAMINATION:  GENERAL:  She is alert and oriented, appears much  younger than stated age.  She is in no apparent distress.  HEART:  Regular rate and rhythm.  LUNGS:  Clear to auscultation anteriorly.  ABDOMEN:  Thin, soft, nontender, nondistended with active bowel sounds,  although she does complain of being tender in the right flank and right  inguinal areas.   LABORATORY DATA:  Current hemoglobin of 11.4 that is down from 14.8 on  December 06, 2006, however, she has been vomiting and was quite  dehydrated.  Hematocrit 33, white count 6.9, platelets 285,000. BMET  shows a potassium of 3.4, PTT 31, PT 12.6.  She is guaiac negative.   DIAGNOSTICS:  On CT in September 2008, she had an abnormality adjacent  to the second portion of her duodenum which appeared less prominent than  the previous CT done in March.  The radiologist questioned neoplasm at  that time.   ASSESSMENT:  This is a 65 year old female with a 1 week history of  nausea, vomiting and constipation as well as  fecal incontinence.  She  has a history of a small bowel obstruction.  She has also had minimal  hematemesis.  Her hemoglobin is stable and she is guaiac negative.  Painful chin boil and abnormality on CT.   IMPRESSION:  We will check an acute abdominal series to rule out small  bowel obstruction given history of such as well as surgical history.  Hold off on upper endoscopy for now as the patient is guaiac negative  and her hemoglobin is stable.  The patient is over due for an outpatient  screening colonoscopy.  Chin boil has been treated per primary team.  Agree with Protonix.  We will add clear liquids for now and check her x-  ray.   Thanks very much for this consultation.      Stephani Police, PA    ______________________________  Bernette Redbird, M.D.    MLY/MEDQ  D:  12/10/2006  T:  12/11/2006  Job:  [id number]   cc:   Peggye Pitt, M.D.  Raenette Rover, MD  Bernette Redbird, M.D.

## 2010-06-10 NOTE — Op Note (Signed)
[name], [name] Bell                ACCOUNT NO.:  [id number]   MEDICAL RECORD NO.:  [id number]          PATIENT TYPE:  AMB   LOCATION:  SDS                          FACILITY:  [facility]   PHYSICIAN:  Artist Pais. Weingold, M.D.DATE OF BIRTH:  1945-05-19   DATE OF PROCEDURE:  08/04/2008  DATE OF DISCHARGE:                               OPERATIVE REPORT   PREOPERATIVE DIAGNOSIS:  Exposed hardware of right elbow, status post  open reduction and internal fixation.   POSTOPERATIVE DIAGNOSIS:  Exposed hardware of right elbow, status post  open reduction and internal fixation.   PROCEDURE:  Incision and drainage of above with cultures and hardware  removal.   SURGEON:  Artist Pais. Mina Marble, MD   ASSISTANT:  None.   ANESTHESIA:  General.   TOURNIQUET TIME:  29 minutes.   COMPLICATIONS:  No complications.   DRAINS:  No drains.   OPERATIVE REPORT:  The patient was taken to the operating suite.  After  induction of adequate general anesthesia, right upper extremity was  prepped and draped in usual sterile fashion.  An Esmarch was used to  exsanguinate the limb.  Tourniquet was inflated to 265 mmHg.  At this  point in time, using her previous incision, the skin was incised.  The  exposed plate was identified.  The area on the proximal most extent of  the incision was ellipsed out as the skin was devitalized.  Once this  done, the entire plate was exposed.  The screws were removed and the  plate was removed.  All presumed infected-looking tissue was sent for  culturing as well as the fluid around there, although there was no  purulence, just some serous type fluid.  This was all sent for cultures.  The wound was thoroughly irrigated with a liter of normal saline and  then loosely closed with 3-0 nylon.  A sterile dressing of Xeroform,  4x4s, and a posterior elbow splint was applied.  The patient tolerated  the procedure well and went to recovery room in stable fashion.      Artist Pais  Mina Marble, M.D.  Electronically Signed     MAW/MEDQ  D:  08/04/2008  T:  08/04/2008  Job:  [id number]
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
## 2010-06-10 NOTE — Op Note (Signed)
NAMEHAZELENE, [name] Bell                ACCOUNT NO.:  [id number]   MEDICAL RECORD NO.:  [id number]          PATIENT TYPE:  AMB   LOCATION:  DSC                          FACILITY:  [facility]   PHYSICIAN:  Matthew A. Weingold, M.D.DATE OF BIRTH:  1945/05/01   DATE OF PROCEDURE:  09/05/2008  DATE OF DISCHARGE:                               OPERATIVE REPORT   PREOPERATIVE DIAGNOSES:  Right elbow retained hardware and right  shoulder adhesive capsulitis.   POSTOPERATIVE DIAGNOSES:  Right elbow retained hardware and right  shoulder adhesive capsulitis.   PROCEDURE:  Right elbow hardware removal with ulnar nerve  neurolysis/decompression as well as right shoulder gentle manipulation  and injection, subacromial space.   SURGEON:  Artist Pais. Mina Marble, MD   ASSISTANT:  None.   ANESTHESIA:  General.   TOURNIQUET TIME:  57 minutes.   COMPLICATIONS:  None.   DRAINS:  None.   OPERATIVE REPORT:  The patient was taken to the operating suite after  induction of adequate general anesthesia.  Right upper extremity was  prepped and draped in sterile fashion.  Once this was done, the right  shoulder was gently manipulated into forward flexion, abduction,  internal and external rotation with breaking through adhesions noted,  particularly with forward flexion.  Once this was done, 2 mL of  Celestone and 2 mL of 0.25% plain Marcaine with epinephrine was injected  subacromially.  After this was done, the arm was exsanguinated using an  Esmarch and tourniquet was inflated to 265 mmHg.  At this point in time  using a previous posterior incision, the skin was incised.  The ulnar  nerve was carefully identified and isolated to significant scarring and  neurolysed the entire length of the incision carefully retracted out of  the way.  Once this was done, the lateral plate was identified and a  small hole was made in the triceps lateral border.  Dissection was  carried down to the plate and the plate and  screws were removed without  difficulty.  This wound was irrigated.  Then attention was paid to the  medial side.  Again, ulnar nerve was carefully protected and the medial  side plate was removed.  At the end of the procedure, the wound was  thoroughly  irrigated.  Hemostasis was achieved with bipolar cautery.  The ulnar  nerve was left in its normal position and the skin was closed with  staples.  Xeroform, 4 x 4s, and a compressive wrap was applied.  The  patient tolerated the procedures well and went to the recovery room in  stable fashion.      Artist Pais Mina Marble, M.D.  Electronically Signed     MAW/MEDQ  D:  09/05/2008  T:  09/05/2008  Job:  [id number]
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
## 2010-06-10 NOTE — Op Note (Signed)
NAME:  IVEE, POELLNITZ NO.:  [id number]   MEDICAL RECORD NO.:  [id number]          PATIENT TYPE:  INP   LOCATION:  5737                         FACILITY:  [facility]   PHYSICIAN:  Petra Kuba, M.D.    DATE OF BIRTH:  December 22, 1945   DATE OF PROCEDURE:  12/17/2006  DATE OF DISCHARGE:                               OPERATIVE REPORT   PROCEDURE:  Esophagogastroduodenoscopy with balloon dilatation.   INDICATION:  Gastric outlet obstruction.   Consent was signed after risks, benefits, methods, options thoroughly  discussed multiple times in the past week of the hospitalization.   MEDICINES USED:  Fentanyl 150 mcg, Versed 13 mg.   PROCEDURE:  The video endoscope was inserted by direct vision.  The  esophagus was normal.  Scope passed into the stomach.  Lots of old food  was seen, some of which was washed and suctioned.  We advanced through a  normal antrum and normal pylorus into the duodenal bulb, which was  markedly deformed as previously noted.  The ERCP catheter was advanced  under fluoro through the pinhole opening.  We had previously tried to  advance the scope through there and tried again today without success.  The catheter was inserted into the little opening.  Dye was injected,  which confirmed being in the duodenum.  A Jagwire wire was advanced  through the catheter.  The catheter was removed keeping the wire in the  proper location, and we went ahead and initially advanced a 5-cm  adjustable balloon dilator 10-12 mm.  We initially blew up the 10-mm  balloon, making sure endoscopically it was in the proper position, held  it for 30 seconds, and then increased it to the 11 and then to the 12.  We held the 12 for a minute.  Once the 12 was held for a minute and the  proper location determined endoscopically, the wire and the balloon were  removed.  We tried to insert the scope the into the duodenum but were  unsuccessful.  We then went ahead and readvanced the  catheter, confirmed  proper position in the duodenum as we had previously done under fluoro,  advance the Jagwire and then advance the adjustable 12-15 mm dilator  with again a 5-cm balloon and went ahead and inflated this in the  customary fashion to 13.5 mm, first for 30 seconds, and then to 15 mm  for a minute.  We were then able to advance the scope following the  catheter once it was deflated into the second portion and third portion  of the duodenum.  The wire and the balloon were removed.  We did  withdraw back to the stomach.  Air and water were suctioned.  We then re-  advanced one more time into the second portion of the duodenum.  It was  difficult and tortuous but we were able to do it much easier, being  unsuccessful on other attempts.  There was no sign of active bleeding or  obvious significant complications.  We withdrew back to the stomach.  Again, some of the fluid  was suctioned but not all.  Air was suctioned,  the scope was slowly withdrawn.  The patient tolerated the procedure  well.  There was no obvious immediate complication.   ENDOSCOPIC DIAGNOSES:  1. Duodenum unchanged from previous endoscopy.  2. Gastric outlet obstruction with stomach moderately filled with      food.  3. Injected dye using the endoscopic retrograde      cholangiopancreatography catheter, confirmed second portion of the      duodenum, and then placed a 0.035 Jagwire x2, then proceeded with      two different adjustable balloon dilatations, 5-cm, first at 10-12      mm adjustable balloon and then secondly the 12-15, using it only at      the 13.5 and 15.  4. Able to pass the scope x2 after the 15-mm dilation.  5. Distal duodenum okay without obvious signs of significant trauma or      problems.   PLAN:  See how this dilation works.  Consider surgery for  gastrojejunostomy or a repeat CT if this is unsuccessful.  Consider a CT  angiogram to evaluate SMA syndrome if needed.  Dr. Matthias Hughs to  see  tomorrow.  Will very slowly advance diet and watch her closely after  this procedure.           ______________________________  Petra Kuba, M.D.     MEM/MEDQ  D:  12/17/2006  T:  12/18/2006  Job:  [id number]   cc:   Gabrielle Dare. Janee Morn, M.D.  Acey Lav, MD

## 2010-06-10 NOTE — Consult Note (Signed)
[name] Bell, [name] Bell NO.:  [id number]   MEDICAL RECORD NO.:  [id number]          PATIENT TYPE:  INP   LOCATION:  5733                         FACILITY:  [facility]   PHYSICIAN:  Shirley Friar, MDDATE OF BIRTH:  03-16-1945   DATE OF CONSULTATION:  10/12/2006  DATE OF DISCHARGE:                                 CONSULTATION   CHIEF COMPLAINT:  We were asked to see [name] Bell today in consultation  for a drop in hemoglobin by Dr. Lina Sayre.   HISTORY OF PRESENT ILLNESS:  This is a 65 year old female who complains  of a 1 year history of abdominal pain of 3 types:  1. Epigastric, which resolves with a Pepcid.  2. Lower abdominal and crampy, which resolves with a bowel movement.  3. A searing pain that radiates to her back and down her right arm.      She states that nothing she does helps this pain and that it does      wake her from sleep.   She is positive for frequent vomiting.  She says she vomits a brown,  black liquid.  Last vomiting was yesterday morning.  She is positive for  weakness and dizziness for an extended period of time.  She has had a 15  pound weight loss in the last 1 to 2 months.  She states she has been  having black, tarry stools, again for an unknown period of time.  She  also states that she uses BC powders regularly from 0 to 4 times a day,  depending on how much pain she is in.  Her last bowel movement was  yesterday, October 11, 2006 in the morning.   PAST MEDICAL HISTORY:  Significant for:  1. C-section.  2. Small bowel obstruction with opened abdominal repair.  3. Fractured sacrum.   ALLERGIES:  SHE HAS NO KNOWN DRUG ALLERGIES.   REVIEW OF SYSTEMS:  Is significant for no fevers, shortness of breath or  palpitations; however, she does have positive weight loss as described  in the HPI.   SOCIAL HISTORY:  She is homeless.  Positive for tobacco use.  Denies  alcohol or drugs.   FAMILY HISTORY:  Negative for colon cancer or  ulcer disease.   PHYSICAL EXAMINATION:  GENERAL:  She is alert and oriented in no  apparent distress.  VITAL SIGNS:  Her temperature is 98.3.  Pulse is 86.  Respirations are  18.  Blood pressure is low at 88/44.  HEART:  Regular rate and rhythm with no murmurs, rubs or gallops  appreciated.  LUNGS:  Clear to auscultation anteriorly.  ABDOMEN:  Soft, nontender, nondistended with good bowel sounds.   LABORATORY DATA:  Show a hemoglobin, on admission, of 9.4, which has  dropped to 7.2 this morning.  Her hematocrit is 21.6, white count is 4.5  and platelets are 230,000.  Chem-7 is within normal limits, specifically  with a BUN of 3, creatinine of 0.7.  LFTs are within normal limits.  Lipase is 22, albumin is 3.5.  CT scan shows an abnormality seen  adjacent  to the second portion of the duodenum with no acute abnormality  in the pelvis.   ASSESSMENT:  Dr. Charlott Rakes has seen and examined the patient,  collected a history and reviewed her records.  He believes that this is  likely an upper gastrointestinal bleed, likely from peptic ulcer disease  from her NSAIDs.  Will plan for upper endoscopy this morning.  Thank you  very much for this consultation.      Stephani Police, [place]      Shirley Friar, MD  Electronically Signed    MLY/MEDQ  D:  10/12/2006  T:  10/12/2006  Job:  [id number]   cc:   Joaquin Courts, MD

## 2010-06-10 NOTE — Op Note (Signed)
NAME:  [name] Bell, [name] Bell NO.:  [id number]   MEDICAL RECORD NO.:  [id number]          PATIENT TYPE:  OIB   LOCATION:  5004                         FACILITY:  [facility]   PHYSICIAN:  Artist Pais. Weingold, M.D.DATE OF BIRTH:  [birth year]   DATE OF PROCEDURE:  06/08/2008  DATE OF DISCHARGE:  06/10/2008                               OPERATIVE REPORT   PREOPERATIVE DIAGNOSIS:  Displaced intra-articular fractures, right  distal humerus and right distal radius.   POSTOPERATIVE DIAGNOSIS:  Displaced intra-articular fractures, right  distal humerus and right distal radius.   PROCEDURE:  1. Open reduction and internal fixation, supracondylar fracture right      humerus.  2. Open reduction and internal fixation right distal radius fracture.  3. Right carpal tunnel release.   SURGEONS:  1. Artist Pais Mina Marble, MD  2. Vanita Panda. Magnus Ivan, MD   ANESTHESIA:  General.   TOURNIQUET TIME:  37 minutes for the distal radius fracture.  The  tourniquet was dropped for 10 minutes and then reinflated and kept up  for 2 hours with the distal humerus fracture.   No complication.   No drains.   OPERATIVE REPORT:  The patient was taken to the operating suite.  After  the induction of adequate general anesthesia, right upper extremity was  prepped and draped in sterile fashion.  An Esmarch was used to  exsanguinate the limb.  Tourniquet was then inflated to 250 mmHg.  At  this point in time, an incision was made on the volar aspect of the  distal radius.  With the hand in supinated position, the skin was  incised 6 cm off the flexor carpi radialis tendon sheath.  The sheath  was incised.  FCR was tracked to the midline, radial artery to the  lateral side.  Interval was developed down the level of pronator  quadratus.  The pronator quadratus was stripped off the distal radius  exposing the intra-articular fracture which was reduced with manual  traction, flexion, ulnar deviation.   Once this was done, a DVR short  plate was placed on the lower aspect of the distal radius, fixed through  the side drill hole under direct fluoroscopic guidance.  This was placed  in adequate position.  Once this was done, the remaining cortical screws  were placed proximally followed by smooth pegs distally.  Intraoperative  fluoroscopy revealed adequate reduction in both AP lateral and oblique  view.  The median nerve was identified in the wound and trace into the  carpal canal.  Carpal canal was identified.  The transverse carpal  ligament was identified and a small trap was made dorsal and volar to  the transverse carpal ligament with the Therapist, nutritional.  The Freer  elevator was then placed on top of the median nerve, and the transverse  carpal ligament was divided from proximal distal under direct vision.  Wound was then thoroughly irrigated, loosely closed in layers of 0  Vicryl and a 3-0 Prolene subcuticular stitch.  Steri-Strips, 4 x 4's,  fluffs, and compressing Coban wrap was applied.  The tourniquet was  released  for 10 minutes.  At this point in time, the hand was re-  exsanguinated.  Tourniquet was inflated to 250 mmHg.  At this point in  time, the arm was placed over the chest, and a posterior incision to the  olecranon was made.  Skin was incised 6 cm proximal and distal to the  tip of the olecranon.  Once this was done on the medial aspect of the  incision, the ulnar nerve was identified and retracted with a vessel  loop.  The DePuy small olecranon plate was then placed onto the  olecranon, and the proximal and distal screws were drilled.  They were  then removed.  The plates were removed, and a chevron osteotomy was  performed of the olecranon.  The osteotomy was completed with an  osteotome, and then the osteotomized olecranon tip and the triceps were  then carefully retracted proximally including release of the ulnar nerve  on the medial side and laterally.  The triceps  was split up to the level  of the fracture site.  The fracture side was identified, carefully  reduced with manual pressure and gentle forward flexion of the elbow.  Several intra-articular components were then carefully reduced.  At this  point in time, a right medial plate was placed, small onto the medial  aspect of the distal humerus and fixed with the appropriate locking and  nonlocking screws.  Second, a posterolateral plate small was also placed  again in the posterolateral side, again used with the combination of  locking and nonlocking screws as per protocol.  Intraoperative  fluoroscopy was used to confirm good placement of all the hardware, and  adequate reduction of the fracture.  Once this was done, the wound was  then thoroughly irrigated.  The olecranon was then reattached using the  previously drilled holes in the olecranon plate and this was then  followed by closure of the interval between the triceps fascia using 0  Vicryl.  At the end of the procedure, 2-0 Vicryl was used to coapt the  subcutaneous tissues, and staples on the skin.  Xeroform, 4 x 4's,  fluffs, and a posterior elbow splint and wrist splint was applied using  5 x 30 splints.  The patient tolerated all procedures well and went to  recovery in stable fashion.       Artist Pais Mina Marble, M.D.  Electronically Signed     Artist Pais. Mina Marble, M.D.  Electronically Signed    MAW/MEDQ  D:  06/10/2008  T:  06/11/2008  Job:  [id number]

## 2010-06-10 NOTE — Discharge Summary (Signed)
NAMEKIMYA, [name] Bell NO.:  [id number]   MEDICAL RECORD NO.:  [id number]          PATIENT TYPE:  INP   LOCATION:  1444                         FACILITY:  [facility]   PHYSICIAN:  Bernette Redbird, M.D.   DATE OF BIRTH:  1945-08-24   DATE OF ADMISSION:  10/28/2007  DATE OF DISCHARGE:  11/03/2007                               DISCHARGE SUMMARY   FINAL DIAGNOSES:  1. Ischemic colitis of the distal colon.  2. Abdominal pain, diarrhea, and hematochezia secondary to #1.  3. Past history of duodenal ulcer with gastric outlet obstruction and      gastrointestinal bleeding.  4. History of osteomyelitis.  5. Vertebral compression fractures.  6. Periodontitis.  7. Chronic anemia.  8. Superior mesenteric artery syndrome.  9. Chronic pain.   CONSULTATIONS:  None.   PROCEDURES:  Flexible sigmoidoscopy by Dr. Ewing Schlein October 30, 2007 showing  changes of significant ischemic colitis.  Biopsies showed compatible  findings of mucosal necrosis and fibrin microthrombi suggestive of  ischemic colitis.   COMPLICATIONS:  None.   LABORATORY DATA:  Admission white count was 13,900 and this fell  progressively to a level of 5500 by the time of discharge.  Admission  hemoglobin was elevated 16.6 consistent with hemoconcentration and this  was 12.6 at the time of discharge.  Platelets normal on admission at  205,000.  Differential count on admission showed 89 polys and 5 lymphs,  6 monocytes.  INR of 1.0.  Chemistry panel on admission normal except  for glucose of 130, normal amylase and lipase.  Albumin after overnight  hydration was 2.9.  Liver chemistries normal.  TSH normal at 0.74.  Stool was positive for occult blood, negative for C. diff, positive for  fecal lactoferrin.   HOSPITAL COURSE:  [name] Bell was admitted through the emergency room  because of abdominal pain and hematochezia.  As noted, evaluation showed  changes consistent with ischemic colitis, so she was managed  expectantly  with supportive care including intravenous fluids, bowel rest, and pain  medications.  Her symptoms improved gradually day by day with associated  improvement by laboratory parameters.   By the day of discharge, the patient was up and walking in the halls,  tolerating a solid diet, needing only minimal Percocet for pain (she  uses Percocet at home for chronic back pain anyway), and she felt ready  to go home so discharge was arranged.   DISPOSITION:  The patient is discharged home on a low residue diet that  she may advanced gradually.  She will follow up with Dr. Ewing Schlein in the  office on Thursday October 22 at 9:45 a.m.   DISCHARGE MEDICATIONS:  The patient is not going home on any new  medications.  She will rule will resume her previous home medications as  follows:  1. Protonix 40 mg p.o. b.i.d.  2. Boniva injection monthly through the [facility] Medicine Outpatient      Clinic (she indicates she is overdue for an injection).  3. Percocet 5/325 p.r.n. (typically uses 1 to 4 tablets daily, as  needed).  4. Penicillin 500 mg once daily (this was restarted through the      Internal Medicine Clinic and Infectious Disease Clinic for      suppression of a chronic chin infection).   CONDITION ON DISCHARGE:  Improved.           ______________________________  Bernette Redbird, M.D.     RB/MEDQ  D:  11/03/2007  T:  11/03/2007  Job:  [id number]   cc:   Outpatient [facility]   Petra Kuba, M.D.  Fax: [phone]

## 2010-06-10 NOTE — Op Note (Signed)
NAME:  [name] Bell, [name] Bell NO.:  [id number]   MEDICAL RECORD NO.:  [id number]          PATIENT TYPE:  AMB   LOCATION:  SDS                          FACILITY:  [facility]   PHYSICIAN:  Charlynne Pander, D.D.S.DATE OF BIRTH:  September 30, 1945   DATE OF PROCEDURE:  04/15/2007  DATE OF DISCHARGE:                               OPERATIVE REPORT   PREOPERATIVE DIAGNOSES:  1. History of recurrent chin abscess.  2. Chronic apical periodontitis.  3. Chronic periodontitis.  4. Multiple retained root segments.  5. Hyperplastic tissues.  6. Bilateral maxillary excessive tuberosities.  7. Apically positioned maxillary labial frenulum.   POSTOPERATIVE DIAGNOSES:  1. History of recurrent chin abscess.  2. Chronic apical periodontitis.  3. Chronic periodontitis.  4. Multiple retained root segments.  5. Hyperplastic tissues.  6. Bilateral maxillary excessive tuberosities.  7. Apically positioned maxillary labial frenulum.   OPERATIONS:  1. Extraction of remaining teeth (tooth numbers 20, 21, 22, 23, 24,      25, 26, 27 and 29).  2. Two quadrants of alveoloplasty.  3. Bilateral maxillary tuberosity reductions.  4. Maxillary arch removal of hyperplastic tissues.  5. Soft tissue biopsy and submittal of bone from the area of tooth      number 24 and 25 for pathology and microbiology    evaluation.  1. Maxillary labial frenectomy.   SURGEON:  Charlynne Pander, D.D.S.   ASSISTANT:  Merrilee Seashore (Sales executive)..   ANESTHESIA:  General anesthesia via nasoendotracheal tube.   MEDICATIONS:  1. Ancef 1 gram IV prior to invasive dental procedures.  2. Local anesthesia with a total utilization of 5 carpules each      containing 36 mg of Xylocaine with 0.018 mg of epinephrine as    well as 2 carpules each containing 9 mg of bupivacaine with 0.009 mg  of epinephrine.   SPECIMENS/CULTURES:  1. There were nine teeth THAT were discarded.  2. There was a soft tissue biopsy in the  area of tooth number 24 and      25 that was submitted to rule out Actinomyces infection    and other evalaution as per Dr. Jarrett Ables request.  1. Submittal of bone to pathology to rule out osteomyelitis in this      area.   ESTIMATED BLOOD LOSS:  100 mL.   FLUIDS:  1400 mL of lactated Ringer solution.   COMPLICATIONS:  None.   INDICATIONS:  The patient had a history of recurrent chin infection.  Dental consultation was requested to evaluate dentition and to rule out  dental infection that may be affecting the patient's systemic health.  The patient was then examined, and a treatment plan for multiple  extractions with alveoloplasty, submittal of tissue and bone to rule out  potential osteomyelitis, and to provide preprosthetic surgery as  indicated.  This treatment plan was formulated to decrease the risk and  complications associated dental infection from further affecting the  patient's systemic health.   OPERATIVE FINDINGS:  The patient was examined in operating room #4.  The  teeth were identified for extraction.  The patient  noted be affected by  chronic apical periodontitis, chronic periodontitis, multiple retained  root segments, excessive maxillary tuberosities, and the presence of  maxillary arch excessive tissues.  The patient also had an apically  positioned labial frenum.   DESCRIPTION OF PROCEDURE:  The patient was brought to the main operating  room #4.  The patient was then placed in the supine position on the  operating room table.  General anesthesia was then induced via  nasoendotracheal tube.  The patient was then prepped and draped in the  usual manner for a dental medicine procedure.  A time-out was then  performed, and the patient was identified, and procedures verified.  A  throat pack was placed at this time.  The oral cavity was then  thoroughly examined with the findings noted above.  The patient was then  ready for the dental medicine procedure as  follows:   Local anesthesia was administered sequentially with a total utilization  of 5 carpules each containing 36 mg Xylocaine with 0.018 mg of  epinephrine as well as 2 carpules each containing 9 mg of bupivacaine  with 0.009 mg of epinephrine.   The maxillary quadrants were first approached.  Anesthesia was delivered  via infiltration to the facial and palatal aspects as indicated.  A 15-  blade incision was then made from the maxillary right tuberosity and  extended to the distal aspect of the maxillary left tuberosity.  Further  soft tissue incisions were made with the 15-blade to perform a soft  tissue reduction to the tuberosity of the maxillary right and maxillary  left quadrants.  Further excessive tissues were then removed along the  alveolar aspect to remove the hyperplastic tissues present in the  premaxilla and overall tissues of the maxillary arch.  At this point in  time, the tissues were approximated and trimmed as indicated.  Further  alveoloplasty was performed as needed.  The surgical sites were then  irrigated with copious amounts of sterile saline x2.  The surgical site  was then closed from the maxillary right tuberosity, and extended to the  mesial of #8 utilizing 3-0 chromic gut suture in a continuous  interrupted suture technique x1.  The maxillary left quadrant was then  closed from the maxillary left tuberosity and extended to the mesial of  #9 utilizing 3-0 chromic gut suture in a continuous interrupted suture  technique x1.   At this point time, the apically positioned maxillary labial frenum was  approached.  It was secured with a soft tissue pickups.  A frenectomy  was performed utilizing a 15-blade and soft tissue incisions.  The area  was further reflected and surgical site was then irrigated with copious  amounts of sterile saline.  The surgical site was then closed utilizing  four separate 4-0 chromic gut sutures in an interrupted technique.   At  this point in time the mandibular quadrants were approached.  The  patient was given bilateral inferior alveolar nerve blocks utilizing the  bupivacaine with epinephrine.  Further infiltration was then achieved as  indicated utilizing the Xylocaine with epinephrine.  A 15-blade incision  was then made from the distal of #19 and extended to the distal of #31.  A surgical flap was then carefully reflected.  Appropriate amounts of  buccal and interseptal bone were removed around tooth numbers 20, 21,  22, 27, and 29 appropriately.  The teeth were then subluxated with a  series of straight elevators.  Tooth numbers 20, 21, 22,  23, 24, 25, 26,  27, and 29 were then removed with a 151 forceps without complications.  Alveoplasty was then performed utilizing rongeurs and bone file.   At this point time, a soft tissue biopsy was performed in the area of  tooth numbers 24 and 25, and submitted to pathology to rule out a  possible actinomyces infection.  Further bone and soft tissues were then  submitted at the request of the infectious disease team to rule out  osteomyelitis, and other infection as indicated.  At this point time,  the alveoloplasty was completed utilizing rongeurs and bone file.  The  surgical sites were then irrigated with copious amounts sterile saline.  The surgical site was then closed from the distal of #19 and extended to  the mesial of #24 utilizing 3-0 chromic gut suture in a continuous  interrupted suture technique x1.  The mandibular right quadrant then  closed from the distal of #31 and extended to the mesial of #25  utilizing 3-0 chromic gut suture in a continuous interrupted suture  technique x1.  A surgical defect in the mandibular anterior midline area  was then closed utilizing four separate 4-0 chromic gut sutures in an  interrupted technique.   At this point in time the entire mouth was irrigated with copious  amounts of sterile saline.  The patient was examined  for complications,  seeing none, the dental medicine procedure was deemed to be complete.  The throat pack was removed at this time.  A series of 4x4 gauze were  placed in the mouth to aid hemostasis.  The patient was then handed to  the anesthesia team for final disposition.  After the appropriate amount  of time, the patient was taken to the post anesthesia care unit with  stable vital signs and good oxygenation level.  All counts were correct  for dental medicine procedure.  The patient will have the pathology  reports evaluated by Dr. Clydie Braun as indicated.  The patient  currently is planned to receive a PICC line later today for initiation  of IV antibiotic therapy, for the possible osteomyelitis and recurrent  chin infection.  The patient will be seen in approximately 1 week for  evaluation for suture removal by Dental Medicine.      Charlynne Pander, D.D.S.  Electronically Signed     RFK/MEDQ  D:  04/15/2007  T:  04/15/2007  Job:  [id number]   cc:   Mick Sell, MD  Peggye Pitt, M.D.

## 2010-06-10 NOTE — Op Note (Signed)
NAME:  [name] Bell, [name] Bell NO.:  [id number]   MEDICAL RECORD NO.:  [id number]          PATIENT TYPE:  INP   LOCATION:  5737                         FACILITY:  [facility]   PHYSICIAN:  Petra Kuba, M.D.    DATE OF BIRTH:  [birth year]   DATE OF PROCEDURE:  12/13/2006  DATE OF DISCHARGE:                               OPERATIVE REPORT   PROCEDURE:  EGD.   INDICATIONS:  Patient with nausea, vomiting, history of ulcer, want to  recheck, also episodic guaiac positivity and mild anemia.   ADDITIONAL MEDICINES USED:  Versed 3 mg only.   Consent was signed prior to any premeds given, after risks, benefits,  methods, options thoroughly discussed prior to any sedation given,  multiple days in the hospital with both myself and Dr. Matthias Hughs.   PROCEDURE:  The video endoscope was inserted by direct vision.  Esophagus was normal.  She did have a small hiatal hernia and a widely  patent ring.  Scope passed into the stomach, which was evaluated on  straight and retroflexed visualization, with a good look at the cardia,  fundus, angularis, lesser and greater curve, which were all normal.  The  pylorus was deformed and a little erythematous.  The duodenal bulb was  deformed.  We could see just a tiny area, which we tried multiple times  to advance the scope through.  Occasionally, it looked like we were  seeing through this area into the more distal duodenum, but could not  advance the scope through it on multiple attempts.  We elected to  withdraw the scope after air and water were suctioned from the stomach  and the endoscopy was completed, and we inserted the pediatric upper  endoscope, and again could not advance it any further into the duodenum  past the bulb.  It seemed to be a stricture from where the ulcer was  noted.  In reviewing Dr. Marge Duncans note, he was unable to advance the  scope past the ulcer he had seen as well.  The scope was withdrawn.  Air  was suctioned.  The  patient tolerated the procedure well.  There was no  obvious immediate complication.   ENDOSCOPIC DIAGNOSES:  1. Small hiatal hernia, with a widely patent ring.  2. Deformed pylorus.  3. Bulb with a stricture.  Unable to advance either the regular or the      pediatric scope.   PLAN:  Will get an upper GI to evaluate.  Consider balloon dilatation,  pending findings of barium swallow.  I have discussed all of the above  with the patient and her friend.  Continue pump inhibitors.           ______________________________  Petra Kuba, M.D.    MEM/MEDQ  D:  12/13/2006  T:  12/14/2006  Job:  [id number]   cc:   Acey Lav, MD  Fax: [phone]

## 2010-06-10 NOTE — Op Note (Signed)
NAME:  KHRISTINE, VERNO NO.:  [id number]   MEDICAL RECORD NO.:  [id number]          PATIENT TYPE:  INP   LOCATION:  1444                         FACILITY:  [facility]   PHYSICIAN:  Petra Kuba, M.D.    DATE OF BIRTH:  1945/03/23   DATE OF PROCEDURE:  DATE OF DISCHARGE:                               OPERATIVE REPORT   PROCEDURE:  Flex sig.   INDICATION:  Ischemic colitis wanting confirmed.  Consent was signed  after risks, benefits, methods, options thoroughly discussed multiple  times in the past.   MEDICATIONS USED:  Fentanyl 50 mcg, Versed 8 mg.   PROCEDURE IN DETAIL:  Rectal inspection pertinent for external  hemorrhoids small, digital exam negative.  Pediatric video colonoscope  inserted and was difficult to do due to a tortuous sigmoid, polyps were  in this area at about 45 cm, were able to see the beginnings of some  ulcerations.  As we advanced to 50 cm the ulcerations became worse and  the mucosa was totally denuded.  We elected to withdraw at that  junction.  Some biopsies of the distal ulceration and the edge of the  ulcers were obtained in the distal descending.  The scope was further  withdrawn.  The prep was fair but no other abnormalities were seen as we  slowly withdrew back to the rectum.  Anorectal pull-through and  retroflexion confirmed some small hemorrhoids.  Scope was straightened  and readvanced towards the left side of the colon.  Air was suctioned,  scope removed.  The patient tolerated the procedure fairly well.  There  was no obvious immediate complication.   ENDOSCOPIC DIAGNOSES:  1. Internal and external hemorrhoids.  2. Significant sigmoid tortuosity.  3. Increased descending ulcerations not advanced very far status post      biopsy at the end of the ulcers compatible with ischemic colitis.  4. Exam only to 50 cm.   PLAN:  Keep on clear liquids.  Still think we can hold antibiotics as  she does not have a fever.  Belly is soft and  no guarding or rebound and  her white count has come down to normal.  I think we can continue to  watch in the hospital until we can advance her diet.  Await pathology to  confirm.           ______________________________  Petra Kuba, M.D.     MEM/MEDQ  D:  10/30/2007  T:  10/30/2007  Job:  [id number]
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
## 2010-06-10 NOTE — Op Note (Signed)
NAME:  [name] Bell, [name] Bell NO.:  [id number]   MEDICAL RECORD NO.:  [id number]          PATIENT TYPE:  INP   LOCATION:  5737                         FACILITY:  [facility]   PHYSICIAN:  Petra Kuba, M.D.    DATE OF BIRTH:  February 04, 1945   DATE OF PROCEDURE:  12/13/2006  DATE OF DISCHARGE:                               OPERATIVE REPORT   PROCEDURE:  Colonoscopy.   INDICATION:  Patient with change in bowel habits, episodic guaiac  positivity, anemia.   Consent was signed after risks, benefits, methods, options thoroughly  discussed.  Multiple hospital visits this week prior to sedation.   MEDICATIONS USED:  1. Fentanyl 100 mcg.  2. Versed 7 mg.   PROCEDURE:  Rectal inspection __________ external hemorrhoids small,  digital exam was negative.  The pediatric video colonoscope was inserted  with some difficulty due to a long __________ tortuous colon, was able  to be advanced to the cecum.  No obvious abnormality was seen on  insertion.  We did various abdominal pressures on insertion but no  position changes.  We did in fact insert the scope into the TI which was  normal, although some formed stool was seen in the TI.  Photo  documentation was obtained.  The scope was slowly withdrawn.  The prep  was fair.  There was some residual stool which could not be washed and  suctioned but the areas of the bowel seen, which was the majority, was  all normal.  No signs of polyps, tumors, masses, bleeding or diverticula  were seen as we slowly withdrew back to the rectum.  Anorectal pull-  through on retroflexion confirms some small hemorrhoids.  Scope was  straightened and readvanced a short ways up the left side of the colon.  Air was suctioned, scope removed.  Patient tolerated the procedure well.  There was no obvious immediate complication.   ENDOSCOPIC DIAGNOSIS:  1. Internal, external small hemorrhoids.  2. Tortuous long lengthy colon.  3. Fair preparation.  4. Otherwise  normal to the terminal ileum.   PLAN:  1. Consider repeat screening in 5-10 years if doing well medically.  2. Continue workup with an EGD for nausea, vomiting, mild anemia and      episodic guaiac positivity.           ______________________________  Petra Kuba, M.D.     MEM/MEDQ  D:  12/13/2006  T:  12/13/2006  Job:  [id number]   cc:   Acey Lav, MD  Fax: [phone]

## 2010-06-10 NOTE — Op Note (Signed)
[name] Bell, [name] Bell NO.:  [id number]   MEDICAL RECORD NO.:  [id number]          PATIENT TYPE:  INP   LOCATION:  5733                         FACILITY:  [facility]   PHYSICIAN:  Shirley Friar, MDDATE OF BIRTH:  09-28-1945   DATE OF PROCEDURE:  10/12/2006  DATE OF DISCHARGE:                               OPERATIVE REPORT   PROCEDURE PERFORMED:  Upper endoscopy.   INDICATION:  GI bleed, 65 year old white female with onset of melena and  coffee ground emesis, who has been taking multiple Goody Powders and BC  powders for back pain.   MEDICATIONS:  Fentanyl 75 mcg IV, Versed 7 mg IV.   FINDINGS:  The endoscope was inserted into the oropharynx and the  esophagus was intubated which was normal in its entirety.  The endoscope  was advanced into the stomach which revealed a narrowed pyloric channel  without any frank ulceration in the pyloric channel.  Retroflexion was  done which revealed normal proximal stomach.  The endoscope was  straightened and advanced into the duodenal bulb which was very  edematous.  In the distal duodenal bulb at the genu there was a large  deep ulcer with black eschar and a small amount of oozing at its distal  end.  There were scattered clots in the areas which was able to be  irrigated away from the ulcer bed.  An attempt was made to carefully  advance past the ulcer into the second portion of the duodenum, but due  to severe edema, the second portion of the duodenum could not be  visualized.  On withdrawal back into the duodenal bulb, there was  increased oozing at the distal side of the ulcer.  The decision was made  to inject epinephrine 1:10,000 in and around the ulcer bed.  2 mL of  epinephrine were injected into the ulcer bed with good blanching.  BICAP  Gold probe cautery was then used in short bursts with complete  hemostasis achieved.  The endoscope was then withdrawn and on  withdrawal, there was a small amount of oozing of  blood in the distal  esophagus, likely due to retching during the procedure.  There also  appeared to be some mild erosive esophagitis seen.   ASSESSMENT:  Large duodenal ulcer, likely due to NSAID abuse status post  epinephrine injection and BICAP Gold probe cautery.   PLAN:  1. Continuous IV Protonix infusion  2. Clear liquid diet.  3. Avoid NSAIDs.  4. If patient re-bleeds, would recommend repeat upper endoscopy and if      that is unsuccessful, then she will need embolization versus      surgery.      Shirley Friar, MD  Electronically Signed     VCS/MEDQ  D:  10/12/2006  T:  10/12/2006  Job:  [id number]

## 2010-06-10 NOTE — Discharge Summary (Signed)
NAME:  MICHON, KACZMAREK NO.:  [id number]   MEDICAL RECORD NO.:  [id number]          PATIENT TYPE:  OIB   LOCATION:  5004                         FACILITY:  [facility]   PHYSICIAN:  Artist Pais. Weingold, M.D.DATE OF BIRTH:  [birth year]   DATE OF ADMISSION:  06/08/2008  DATE OF DISCHARGE:  06/10/2008                               DISCHARGE SUMMARY   PRINCIPAL DIAGNOSIS:  Supracondylar distal humerus fracture on the right  and right distal radius fracture.   PRINCIPAL PROCEDURE:  Open reduction and internal fixation of right  supracondylar humerus fracture and right distal radius fracture.   Ms. Ramey is a 65 year old female who fell, was seen in the emergency  department, referred to my office with supracondylar distal humerus  fracture and a distal radius fracture.  She was taken to the operating  room on Jun 08, 2008.  She underwent ORIF of both of these fractures  with plate fixation in the distal humerus and the radius.  She was  admitted for postoperative pain control.  She progressed well through  the first 48 hours of postoperative pain control and was discharged on  Jun 10, 2008, with instruction to follow up in my office in 7-10 days.  She was given postoperative pain medicine and again is to follow up in  my office status post ORIF of her distal radius fracture and  supracondylar humerus fracture on the right.      Artist Pais Mina Marble, M.D.  Electronically Signed     Artist Pais. Mina Marble, M.D.  Electronically Signed    MAW/MEDQ  D:  08/01/2008  T:  08/02/2008  Job:  [id number]

## 2010-06-12 ENCOUNTER — Other Ambulatory Visit: Payer: Self-pay | Admitting: Ophthalmology

## 2010-06-15 ENCOUNTER — Other Ambulatory Visit: Payer: Self-pay | Admitting: Ophthalmology

## 2010-06-16 NOTE — Telephone Encounter (Signed)
Called to pharm 

## 2010-07-17 ENCOUNTER — Ambulatory Visit (INDEPENDENT_AMBULATORY_CARE_PROVIDER_SITE_OTHER): Payer: Medicare Other | Admitting: Internal Medicine

## 2010-07-17 ENCOUNTER — Other Ambulatory Visit: Payer: Self-pay | Admitting: Internal Medicine

## 2010-07-17 VITALS — BP 129/70 | HR 96 | Temp 97.3°F | Ht 64.0 in | Wt 172.5 lb

## 2010-07-17 DIAGNOSIS — E785 Hyperlipidemia, unspecified: Secondary | ICD-10-CM

## 2010-07-17 DIAGNOSIS — M79673 Pain in unspecified foot: Secondary | ICD-10-CM

## 2010-07-17 DIAGNOSIS — K5900 Constipation, unspecified: Secondary | ICD-10-CM

## 2010-07-17 DIAGNOSIS — M79609 Pain in unspecified limb: Secondary | ICD-10-CM

## 2010-07-17 MED ORDER — ESOMEPRAZOLE MAGNESIUM 40 MG PO CPDR: 40.0000 mg | DELAYED_RELEASE_CAPSULE | Freq: Every day | ORAL | Status: DC

## 2010-07-17 MED ORDER — PRAVASTATIN SODIUM 40 MG PO TABS: 40.0000 mg | ORAL_TABLET | Freq: Every day | ORAL | Status: DC

## 2010-07-17 MED ORDER — HYDROCODONE-ACETAMINOPHEN 7.5-750 MG PO TABS: 1.0000 | ORAL_TABLET | Freq: Four times a day (QID) | ORAL | Status: DC | PRN

## 2010-07-17 NOTE — Progress Notes (Signed)
  Subjective:    Patient ID: [name] Bell, female    DOB: 1945/10/10, 65 y.o.   MRN: [id number]  HPI 65 year old female with multiple medical problems is here for followup. She is complaining of heel pain on the left foot that has been ongoing on and off for last few months. She is tried taking Tylenol but the pain is not always relieved. Patient was more pain when she is not using the foot.  She also complains of constipation, nausea, abdominal distention, and frequent belching. She has seen Dr. Harrison Mons before for similar complaints and she wants referral to follow with him. She also will refill on several of her medications.  Pressure process her left jaw is still bothering her. She is followed with multiple physicians regarding this problem. She takes Vicodin for multiple other problems and would like to have refill on that.    Review of Systems  All other systems reviewed and are negative.       Objective:   Physical Exam  Constitutional: She is oriented to person, place, and time. She appears well-developed and well-nourished.  HENT:  Head: Normocephalic and atraumatic.  Right Ear: External ear normal.  Left Ear: External ear normal.       Left TMJ s/p surgery, alignment not smooth  Eyes: Conjunctivae and EOM are normal. Pupils are equal, round, and reactive to light.  Neck: No JVD present. No tracheal deviation present. No thyromegaly present.  Cardiovascular: Normal rate, regular rhythm and normal heart sounds.  Exam reveals no gallop.   No murmur heard. Pulmonary/Chest: No respiratory distress. She has no wheezes. She has no rales. She exhibits no tenderness.  Abdominal: Soft. Bowel sounds are normal. She exhibits no distension and no mass. There is no tenderness. There is no rebound and no guarding.  Musculoskeletal: Normal range of motion. She exhibits tenderness. She exhibits no edema.       Left heel pain on medial aspect only. No spurs felt.   Lymphadenopathy:    She  has no cervical adenopathy.  Neurological: She is alert and oriented to person, place, and time. She has normal reflexes. No cranial nerve deficit. Coordination normal.  Skin: No rash noted. No erythema.  Psychiatric: She has a normal mood and affect. Her behavior is normal. Thought content normal.          Assessment & Plan:

## 2010-07-17 NOTE — Patient Instructions (Signed)
Return in one month Follow up with GI doctor and sports medicine doctor.

## 2010-07-17 NOTE — Assessment & Plan Note (Signed)
Offered symptomatic therapy but wanted referral to Dr. Ewing Schlein. I will comply but it seems patient is fond of specialist care.

## 2010-07-17 NOTE — Assessment & Plan Note (Signed)
No muscle pains, ldl was high on last check. No recheck seen. Pt not fasting, but will need repeat LDL for achieving goal and adjusting therapy.

## 2010-07-17 NOTE — Assessment & Plan Note (Signed)
appears to be calcaneal tuberosity related pain. Will refer to sports medicine for further assessment and treatment. Patient may benefit from inserts and specialized foot ware.

## 2010-08-11 ENCOUNTER — Encounter: Payer: Self-pay | Admitting: Internal Medicine

## 2010-08-11 ENCOUNTER — Ambulatory Visit (INDEPENDENT_AMBULATORY_CARE_PROVIDER_SITE_OTHER): Payer: Medicare Other | Admitting: Internal Medicine

## 2010-08-11 VITALS — BP 139/64 | HR 98 | Temp 98.4°F | Ht 64.0 in | Wt 171.1 lb

## 2010-08-11 DIAGNOSIS — M25519 Pain in unspecified shoulder: Secondary | ICD-10-CM

## 2010-08-11 DIAGNOSIS — M79673 Pain in unspecified foot: Secondary | ICD-10-CM

## 2010-08-11 DIAGNOSIS — R5381 Other malaise: Secondary | ICD-10-CM

## 2010-08-11 DIAGNOSIS — K219 Gastro-esophageal reflux disease without esophagitis: Secondary | ICD-10-CM

## 2010-08-11 DIAGNOSIS — J449 Chronic obstructive pulmonary disease, unspecified: Secondary | ICD-10-CM

## 2010-08-11 DIAGNOSIS — R5383 Other fatigue: Secondary | ICD-10-CM

## 2010-08-11 DIAGNOSIS — M79609 Pain in unspecified limb: Secondary | ICD-10-CM

## 2010-08-11 DIAGNOSIS — M25511 Pain in right shoulder: Secondary | ICD-10-CM

## 2010-08-11 DIAGNOSIS — R112 Nausea with vomiting, unspecified: Secondary | ICD-10-CM

## 2010-08-11 DIAGNOSIS — E785 Hyperlipidemia, unspecified: Secondary | ICD-10-CM

## 2010-08-11 DIAGNOSIS — F329 Major depressive disorder, single episode, unspecified: Secondary | ICD-10-CM

## 2010-08-11 DIAGNOSIS — G894 Chronic pain syndrome: Secondary | ICD-10-CM

## 2010-08-11 DIAGNOSIS — E538 Deficiency of other specified B group vitamins: Secondary | ICD-10-CM

## 2010-08-11 DIAGNOSIS — M549 Dorsalgia, unspecified: Secondary | ICD-10-CM

## 2010-08-11 LAB — CBC WITH DIFFERENTIAL/PLATELET
Basophils Absolute: 0 10*3/uL (ref 0.0–0.1)
Eosinophils Relative: 1 % (ref 0–5)
HCT: 45.1 % (ref 36.0–46.0)
Lymphocytes Relative: 44 % (ref 12–46)
MCV: 91.9 fL (ref 78.0–100.0)
Monocytes Absolute: 0.4 10*3/uL (ref 0.1–1.0)
RDW: 14.2 % (ref 11.5–15.5)
WBC: 6.3 10*3/uL (ref 4.0–10.5)

## 2010-08-11 MED ORDER — ALBUTEROL SULFATE HFA 108 (90 BASE) MCG/ACT IN AERS: 2.0000 | INHALATION_SPRAY | RESPIRATORY_TRACT | Status: DC | PRN

## 2010-08-11 MED ORDER — ONDANSETRON HCL 4 MG PO TABS: 4.0000 mg | ORAL_TABLET | Freq: Three times a day (TID) | ORAL | Status: DC | PRN

## 2010-08-11 MED ORDER — OXYCODONE-ACETAMINOPHEN 5-325 MG PO TABS: 1.0000 | ORAL_TABLET | Freq: Four times a day (QID) | ORAL | Status: DC | PRN

## 2010-08-11 MED ORDER — ESOMEPRAZOLE MAGNESIUM 40 MG PO CPDR: 40.0000 mg | DELAYED_RELEASE_CAPSULE | Freq: Two times a day (BID) | ORAL | Status: DC

## 2010-08-11 MED ORDER — BUPROPION HCL 100 MG PO TABS: 100.0000 mg | ORAL_TABLET | Freq: Three times a day (TID) | ORAL | Status: DC

## 2010-08-11 NOTE — Patient Instructions (Signed)
Your nausea and vomiting may be due to your GERD (acid reflux). -we have increased your nexium to 40 mg, twice per day.  **if you have any problems filling this prescription, please call our clinic** -you have been prescribed an anti-nausea medication called Zofran.  Take 1 tablet every 8 hours as needed for nausea  For your left heel pain, we have sent a referral for podiatry.  Please call to schedule an appointment.  For your back pain and shoulder pain, we have sent a referral for an orthopedic surgery evaluation. -we have also sent a referral to physical therapy, which can help you with strengthening exercises to decrease the pain. -we have changed your pain medication from Vicodin to Percocet.  You may take 1 percocet up to every 6 hours as needed for pain.  You must discontinue and dispose of your remaining vicodin.  For your fatigue, we have ordered a blood count and a vitamin B12 level -we have also refilled your wellbutrin prescription.  Please return for a follow-up visit in 2 weeks, and we can re-evaluate your response to these new medications.

## 2010-08-11 NOTE — Assessment & Plan Note (Signed)
Patient notes lower back pain, history of 2 compression fractures and osteoporosis -referral to orthopedics for evaluation and treatment -referral to PT for strengthening and pain control -percocet prn for pain -gabapentin for pain

## 2010-08-11 NOTE — Assessment & Plan Note (Signed)
Left heel pain x1.5 months likely due to a calcaneal spur vs ?plantar fasciitis -referral for podiatry -percocet for pain relief

## 2010-08-11 NOTE — Progress Notes (Signed)
Acute visit  HPI: The patient is a 65 yo woman with a history of chronic pain syndrome, duodenal ulcer, HL, anemia secondary to B12 deficiency, depression, and osteoporosis, presenting with acute nausea/vomiting, and to follow-up for pain.  The patient notes a 1-week history of severe nausea/vomiting, noting 1-3 episodes/day of vomiting clear, mucousy, non-bloody, non-billious fluid, with concurrent and persistent nausea.  The symptoms have no temporal correlation with meals, and the patient has been eating only clear liquids.  She notes chronic heartburn but no recent increase in heartburn symptoms, and no abdominal pain.  She notes awakening with a sour taste in her mouth most days.  She was recently decreased from nexium 40 mg BID to qday because of medicaid reimbursement issues 3 weeks ago.  She notes a 1.5 month history of left heel pain, described as a burning, throbbing ache on her left heel, which is worse when lying down at night, and can radiate to her toes.  The pain is present in the morning as well, but improves with walking.  Lidocaine cream and vicodin have not helped the pain.  Percocet has helped the pain in the past.  She notes a 1-year history of fatigue, saying that she is still able to do all of her daily activities, but that she feels much more tired while doing them than she once was.  She notes some feelings of sadness associated with the death of her dog within the last year, but notes that her friends now got her a new dog, which makes her happy.  She has a history of B12 deficiency, but says she has not had a B12 injection in 1 year, and is unsure why.  She also has a history of depression, and ran out of her wellbutrin prescription 1 week ago.  She notes a 2-year history of R shoulder pain, described as a throbbing pain that radiates to her upper neck, worsened by movement, though with no resulting decrease in ROM at the shoulder.  Gabapentin and OTC pyrithione zinc creams help  her pain.  An MRI from January shows cervical spondylosis.  She notes a history of chronic back pain, and 2 prior compression fractures, and now notes a 77-month history of a new low back pain, presenting as a "pinching" pain with movement, which does not radiate.  Percocet helps the pain.  ROS ROS: General: no fevers, chills, changes in weight, changes in appetite Skin: no rash HEENT: no blurry vision, hearing changes, sore throat Pulm: no dyspnea, coughing, wheezing CV: no chest pain, palpitations, shortness of breath Abd: see HPI Ext: see HPI Neuro: no weakness, numbness, or tingling  Filed Vitals:   08/11/10 1433  BP: 139/64  Pulse: 98  Temp: 98.4 F (36.9 C)    MRI Results 02/20/10 IMPRESSION:  Mild multilevel cervical spondylosis with shallow disc bulging and  small protrusions. No significant stenosis. No right sided  localizing lesions identified. Right C4-C5 facet arthrosis.  Chronic T2 superior endplate compression fracture.  PEX General: alert, cooperative, and in no apparent distress HEENT: pupils equal round and reactive to light, vision grossly intact, oropharynx clear and non-erythematous  Neck: supple, no lymphadenopathy Lungs: clear to ascultation bilaterally, normal work of respiration, no wheezes, rales, ronchi Heart: regular rate and rhythm, no murmurs, gallops, or rubs Abdomen: soft, non-tender, non-distended, normal bowel sounds, no guarding, no rebound Extremities:   Left heel tender to palpation, with no ttp of sole of foot, no palpable deformity, full painless ROM of L ankle,  no warmth or edema   R shoulder non-tender to palpation, no palpable deformity, full ROM though mild discomfort noted at extremes of ROM   Back non-tender to palpation, no palpable deformity, mild pain elicited upon flexion of back Neurologic: alert & oriented X3, cranial nerves II-XII intact, strength 5/5 throughout, sensation intact throughout  Assessment/Plan:

## 2010-08-11 NOTE — Assessment & Plan Note (Signed)
The need for a pain contract was discussed with the patient, who believes she signed such an agreement with Dr. Cathey Endow, and voices understanding of, and compliance with, her pain contract -will investigate prior pain contract, and re-write or update with patient at follow-up in 2 weeks -at patient's request, discontinued vicodin and re-started percocet at this visit

## 2010-08-11 NOTE — Assessment & Plan Note (Addendum)
Patient notes 2-yr history of R shoulder pain radiating to neck, and with MRI findings of cervical spondylosis -referral to orthopedic surgery for evaluation -referral to PT for strengthening and pain management -gabapentin and percocet for pain

## 2010-08-11 NOTE — Assessment & Plan Note (Addendum)
Patient reports severe nausea/vomiting x1 week.  Likely 2/2 GERD, given recent decrease in nexium prescription.  Patient also has a history of superior mesenteric artery syndrome and duodenal ulcers. -increased nexium back to her previous dose of 40 mg BID -patient given prescription for zofran for temporary symptomatic relief

## 2010-08-11 NOTE — Assessment & Plan Note (Signed)
LDL elevated at last lab draw.  Patient not fasting today, but told to fast in preparation of next visit. -check lipid panel at next visit -will likely need to increase statin dose, to patient goal LDL < 130

## 2010-08-11 NOTE — Assessment & Plan Note (Signed)
Patient's fatigue possibly due to anemia, given patient's history of B12 deficiency, vs depression -check cbc, b12 level today -refilled patient's wellbutrin prescription

## 2010-08-15 ENCOUNTER — Encounter: Payer: Medicare Other | Admitting: Internal Medicine

## 2010-08-18 ENCOUNTER — Telehealth: Payer: Self-pay | Admitting: *Deleted

## 2010-08-18 NOTE — Telephone Encounter (Signed)
A form needs to be completed and fax back to insurance company for prior authorization on Ondansetron. Called [phone]. Stanton Kidney Katheryn Culliton RN 08/18/10 11:40AM

## 2010-08-20 ENCOUNTER — Telehealth: Payer: Self-pay | Admitting: *Deleted

## 2010-08-20 ENCOUNTER — Other Ambulatory Visit: Payer: Self-pay | Admitting: Internal Medicine

## 2010-08-20 DIAGNOSIS — M549 Dorsalgia, unspecified: Secondary | ICD-10-CM

## 2010-08-20 MED ORDER — HYDROCODONE-ACETAMINOPHEN 7.5-750 MG PO TABS: 1.0000 | ORAL_TABLET | Freq: Four times a day (QID) | ORAL | Status: DC | PRN

## 2010-08-20 NOTE — Progress Notes (Signed)
Patient called our office, with the complaint that her percocet was not relieving her lower back and shoulder pain, and she would like to switch back to vicodin.  She had 13 pills remaining of her percocet prescription, and has an appointment with me in 6 days.  We agreed that she would stop taking percocet, and bring the remaining pills to her appointment with me.  In return, I called in a short-term prescription for vicodin to her pharmacy, for pain relief until her appointment.  The patient also complains of persistent nausea, saying that she was not able to fill her zofran prescription.  An authorization form for the medication was faxed to our office, and returned.  Per the patient's pharmacy, the prescription can now be filled, and will be filled along with her vicodin at this time.

## 2010-08-20 NOTE — Telephone Encounter (Signed)
Pt called stating she was seen last Monday by Dr Manson Passey for shoulder pain.  She has scheduled appointment with rehab but can't be seen until August 8th. Next appointment with Dr Manson Passey next Tuesday 7/31.   Pt states percocet is not touching the pain.  She would like to return to vicodin until she is seen.  She will stop the percocet.  Please advise. Pt # [phone]

## 2010-08-20 NOTE — Telephone Encounter (Signed)
I called the patient to discuss her pain.  See orders note for full details.  I called in a prescription for vicodin to the patient's pharmacy for short-term pain relief until her appointment with me in 6 days, and in return the patient has promised to stop her percocet medication and bring the remaining pills in her bottle to her appointment for disposal.  The patient's zofran prescription has also now been authorized, and will be filled.  Thank you for this note.

## 2010-08-22 NOTE — Progress Notes (Signed)
I agree with Dr. Brown's assessment and plan. 

## 2010-08-26 ENCOUNTER — Ambulatory Visit (INDEPENDENT_AMBULATORY_CARE_PROVIDER_SITE_OTHER): Payer: Medicare Other | Admitting: Internal Medicine

## 2010-08-26 ENCOUNTER — Encounter: Payer: Self-pay | Admitting: Licensed Clinical Social Worker

## 2010-08-26 ENCOUNTER — Encounter: Payer: Self-pay | Admitting: Internal Medicine

## 2010-08-26 ENCOUNTER — Ambulatory Visit: Payer: Medicare Other | Admitting: Licensed Clinical Social Worker

## 2010-08-26 DIAGNOSIS — M549 Dorsalgia, unspecified: Secondary | ICD-10-CM

## 2010-08-26 DIAGNOSIS — F329 Major depressive disorder, single episode, unspecified: Secondary | ICD-10-CM

## 2010-08-26 DIAGNOSIS — M25519 Pain in unspecified shoulder: Secondary | ICD-10-CM

## 2010-08-26 DIAGNOSIS — E785 Hyperlipidemia, unspecified: Secondary | ICD-10-CM

## 2010-08-26 DIAGNOSIS — M79673 Pain in unspecified foot: Secondary | ICD-10-CM

## 2010-08-26 DIAGNOSIS — E538 Deficiency of other specified B group vitamins: Secondary | ICD-10-CM

## 2010-08-26 DIAGNOSIS — M25511 Pain in right shoulder: Secondary | ICD-10-CM

## 2010-08-26 DIAGNOSIS — G894 Chronic pain syndrome: Secondary | ICD-10-CM

## 2010-08-26 DIAGNOSIS — M79609 Pain in unspecified limb: Secondary | ICD-10-CM

## 2010-08-26 LAB — LIPID PANEL
HDL: 58 mg/dL (ref >39)
LDL Cholesterol: 221 mg/dL — ABNORMAL HIGH (ref 0–99)
Total CHOL/HDL Ratio: 5.6 Ratio

## 2010-08-26 MED ORDER — HYDROCODONE-ACETAMINOPHEN 7.5-750 MG PO TABS: 1.0000 | ORAL_TABLET | ORAL | Status: DC | PRN

## 2010-08-26 NOTE — Progress Notes (Signed)
Follow-up Visit  HPI The patient is a 65 yo woman with a history of chronic pain syndrome, duodenal ulcer, HL, anemia, and depression, presenting for a 2-week follow-up of chronic pain.  The patient notes a history of left heel pain, back pain, and right shoulder pain, which are currently managed with vicodin, and are unchanged since her last visit (see note 08/11/10), awaiting further evaluation by orthopedics, for which she has an appointment in 8 days.  Of note, the patient was switched from vicodin to percocet at her last appointment at the patient's request, saying that vicodin no longer helped her pain, but then called the office and asked to switch back to vicodin.  At that time, a prescription for vicodin was called in, under the condition that the patient bring her remaining percocet pills to her next appointment for disposal.  The patient did not bring her percocet pills with her today, saying that she hoped she could keep both medications.  The patient noted at her last appointment a 1-week history of nausea, vomiting, and heartburn, which we believed were due to uncontrolled GERD.  The patient notes significantly reduced heartburn and nausea, with full resolution of vomiting, with an increase in the patient's nexium prescription to BID and zofran prn.  The patient also noted a decrease in mood at her last appointment, after being out of her Wellbutrin medication for 1 week.  Since re-starting her Wellbutrin, she notes a significant improvement in her mood, though she still notes some sadness.  The patient has never been seen by a mental health professional.  ROS: General: no fevers, chills, changes in weight, changes in appetite Skin: no rash HEENT: no blurry vision, hearing changes, sore throat Pulm: no dyspnea, coughing, wheezing CV: no chest pain, palpitations, shortness of breath Abd: no abdominal pain, nausea/vomiting, diarrhea/constipation GU: no dysuria, hematuria, polyuria Ext:  see HPI Neuro: no weakness, numbness, or tingling  Filed Vitals:   08/26/10 1112  BP: 109/63  Pulse: 85  Temp: 97.2 F (36.2 C)  Resp: 20    PEX General: alert, cooperative, and in no apparent distress HEENT: pupils equal round and reactive to light, vision grossly intact, oropharynx clear and non-erythematous  Neck: supple, no lymphadenopathy, JVD Lungs: clear to ascultation bilaterally, normal work of respiration, no wheezes, rales, ronchi Heart: regular rate and rhythm, no murmurs, gallops, or rubs Abdomen: soft, non-tender, non-distended, normal bowel sounds Extremities: no cyanosis, clubbing, or edema    Left heel tender to palpation, with no ttp of sole of foot, no palpable deformity, full painless ROM of L ankle, no warmth or edema     R shoulder non-tender to palpation, no palpable deformity, full ROM though mild discomfort noted at extremes of ROM     Back non-tender to palpation, no palpable deformity, mild pain elicited upon flexion of back Neurologic: alert & oriented X3, cranial nerves II-XII intact, strength 5/5 throughout, sensation intact throughout  Assessment/Plan

## 2010-08-26 NOTE — Assessment & Plan Note (Signed)
Patient notes lower back pain, history of 2 compression fractures and osteoporosis -appointment with orthopedics 8/8 for evaluation -appointment with PT pending -gabapentin and vicodin for pain

## 2010-08-26 NOTE — Patient Instructions (Signed)
For your pain, is it immensely important that you keep your appointment with Orthopedics -we will continue to manage your pain with Vicodin, but cannot prescribe 2 narcotic medications at once.  Please return with your remaining percocet pills, so that we can write you a new prescription for Vicodin.  Please schedule an appointment with psychiatry, for your depression.  We are checking a cholesterol panel today, and will call you if the results are abnormal.  Please return for a follow-up visit in 2-3 weeks.

## 2010-08-26 NOTE — Assessment & Plan Note (Addendum)
B12 checked at last visit, and found to be normal, with a Hb = 15.2 -patient reports no B12 injections for the last year -will discontinue order for B12 injections

## 2010-08-26 NOTE — Assessment & Plan Note (Signed)
-  will check lipid panel today 

## 2010-08-26 NOTE — Assessment & Plan Note (Signed)
Patient notes a history of depression, better with wellbutrin, though still with residual symptoms -referral to psychiatry

## 2010-08-26 NOTE — Assessment & Plan Note (Signed)
Patient expressed a verbal agreement over the phone that she would bring in her remaining percocet pills in exchange for a temporary phone refill of vicodin.  However, the patient did not bring her percocet pills to her visit today.  The patient was informed that she could go back to receiving her regular vicodin prescription, but only after bringing in her remaining percocet pills, per our verbal agreement. -a paper prescription for vicodin was left with nursing, which the patient can pick up when she presents with her remaining percocet pills

## 2010-08-26 NOTE — Assessment & Plan Note (Signed)
Patient notes 2-yr history of R shoulder pain radiating to neck, with MRI findings of cervical spondylosis -orthopedic surgery evaluation appointment 8/8 -PT appointment pending -gabapentin and vicodin for pain

## 2010-08-26 NOTE — Assessment & Plan Note (Signed)
Left heel pain x1.5 months, likely due to a calcaneal spur vs ?plantar fasciitis -podiatry refused to see the patient, stating that they would not evaluate heel pain -will follow-up with orthopedics at appointment 8/8

## 2010-08-27 NOTE — Progress Notes (Signed)
Agree with plans and notes. 

## 2010-08-27 NOTE — Progress Notes (Signed)
20 minutes.  Patient was referred to me by Dr. Manson Passey and Dorie Rank to assist with psychiatry appmt for depression related to chronic pain issues and other stressors. The patient is amenable to my referring to [facility] for psyche consult.  She has Medicare and Medicaid.  Any day is fine except for Aug. 8th after 1:00 PM.     Sarina Ill at [facility] who instructed to fax over demographics and referral to [phone] and they will call the patient directly.   Faxed this PM and will look for [facility] appmt in [facility].

## 2010-08-30 ENCOUNTER — Other Ambulatory Visit: Payer: Self-pay | Admitting: Internal Medicine

## 2010-08-30 DIAGNOSIS — E785 Hyperlipidemia, unspecified: Secondary | ICD-10-CM

## 2010-08-30 MED ORDER — PRAVASTATIN SODIUM 80 MG PO TABS: 80.0000 mg | ORAL_TABLET | Freq: Every day | ORAL | Status: DC

## 2010-09-01 ENCOUNTER — Telehealth: Payer: Self-pay | Admitting: *Deleted

## 2010-09-01 NOTE — Telephone Encounter (Signed)
Pt and pharmacy aware of approveal on Ondansetron tablet from 01/2010 to 01/25/2021. Debera Gilberta Peeters RN 09/01/10 9:40AM

## 2010-09-03 ENCOUNTER — Ambulatory Visit: Payer: Medicare Other | Admitting: Physical Therapy

## 2010-09-16 ENCOUNTER — Ambulatory Visit: Payer: Medicare Other | Admitting: Physical Therapy

## 2010-09-17 ENCOUNTER — Ambulatory Visit: Payer: Medicare Other | Admitting: Internal Medicine

## 2010-09-17 ENCOUNTER — Encounter: Payer: Medicare Other | Admitting: Internal Medicine

## 2010-09-18 ENCOUNTER — Ambulatory Visit (HOSPITAL_COMMUNITY): Payer: Medicare Other | Admitting: Physician Assistant

## 2010-10-13 ENCOUNTER — Encounter: Payer: Self-pay | Admitting: Internal Medicine

## 2010-10-13 NOTE — Progress Notes (Signed)
Progress note received from Dr. Jerl Santos, Sports Med.  Patient seen, and diagnosed with L heel plantar fasciitis, lumbar spondylosis, and R elbow ORIF (since 2010, previously documented).  Patient given stretches to perform, prescribed an OTC L foot insert, and given a dose-pack of prednisone for her heel (and back) pain.  They will consider heel injection for plantar fasciitis in the future.  They will continue to follow this patient.

## 2010-10-17 ENCOUNTER — Encounter: Payer: Self-pay | Admitting: Internal Medicine

## 2010-10-20 LAB — ANAEROBIC CULTURE

## 2010-10-20 LAB — COMPREHENSIVE METABOLIC PANEL
AST: 24
Albumin: 3.9
Alkaline Phosphatase: 109
Chloride: 103
GFR calc Af Amer: >60
Potassium: 4.8
Sodium: 141
Total Bilirubin: 0.5

## 2010-10-20 LAB — FUNGUS CULTURE W SMEAR: Fungal Smear: NONE SEEN

## 2010-10-20 LAB — CBC
Platelets: 187
WBC: 5.8

## 2010-10-20 LAB — TISSUE CULTURE

## 2010-10-23 ENCOUNTER — Other Ambulatory Visit: Payer: Self-pay | Admitting: Internal Medicine

## 2010-10-23 ENCOUNTER — Telehealth: Payer: Self-pay | Admitting: *Deleted

## 2010-10-23 NOTE — Telephone Encounter (Signed)
Pt calls and leaves message that she has N&V, attempted to rtc, left message for rtc

## 2010-10-24 NOTE — Telephone Encounter (Signed)
Called to pharmacy 

## 2010-10-27 LAB — PROTIME-INR
INR: 1
Prothrombin Time: 12.9

## 2010-10-27 LAB — CBC
HCT: 37.6
HCT: 38.1
HCT: 38.4
HCT: 49.5 — ABNORMAL HIGH
Hemoglobin: 12.6
Hemoglobin: 13.8
MCHC: 33.6
MCHC: 33.6
MCV: 91.8
MCV: 93.2
Platelets: 144 — ABNORMAL LOW
Platelets: 153
Platelets: 205
RBC: 4.13
RBC: 4.45
RDW: 14
RDW: 14.2
RDW: 14.6
RDW: 14.8
WBC: 10.5
WBC: 13.9 — ABNORMAL HIGH
WBC: 5.5
WBC: 9

## 2010-10-27 LAB — COMPREHENSIVE METABOLIC PANEL
ALT: 13
AST: 22
Alkaline Phosphatase: 103
Calcium: 8.5
GFR calc Af Amer: >60
Potassium: 4.2
Sodium: 138
Total Protein: 5.5 — ABNORMAL LOW

## 2010-10-27 LAB — DIFFERENTIAL
Basophils Relative: 0
Eosinophils Absolute: 0
Eosinophils Relative: 0
Neutrophils Relative %: 89 — ABNORMAL HIGH

## 2010-10-27 LAB — BASIC METABOLIC PANEL
BUN: 1 — ABNORMAL LOW
BUN: <1 — ABNORMAL LOW
CO2: 25
Calcium: 8.5
Calcium: 9.6
Creatinine, Ser: 0.57
Creatinine, Ser: 0.74
GFR calc Af Amer: >60
GFR calc Af Amer: >60
GFR calc non Af Amer: >60
GFR calc non Af Amer: >60
GFR calc non Af Amer: >60
Glucose, Bld: 98
Potassium: 3.7
Potassium: 3.8
Sodium: 139

## 2010-10-27 LAB — TYPE AND SCREEN: Antibody Screen: NEGATIVE

## 2010-10-27 LAB — CLOSTRIDIUM DIFFICILE EIA: C difficile Toxins A+B, EIA: NEGATIVE

## 2010-10-27 LAB — AMYLASE: Amylase: 47

## 2010-10-27 LAB — FECAL LACTOFERRIN, QUANT: Fecal Lactoferrin: POSITIVE

## 2010-10-27 LAB — LIPASE, BLOOD: Lipase: 15

## 2010-10-27 LAB — APTT: aPTT: 28

## 2010-10-30 ENCOUNTER — Telehealth: Payer: Self-pay | Admitting: *Deleted

## 2010-10-30 NOTE — Telephone Encounter (Signed)
CALLED TO FOLLOW UP WITH PATIENT TO SEE IF SHE HAD CONTACTED THE BILLING OFFICE AT [facility] [phone]). UNABLE TO MAKE REFERRAL APPT SINCE PATIENT HAS A BALANCE WITH THE OFFICE.  UNABLE TO REFERRER TO [facility] SINCE PATIENT IS PATIENT OF [facility]/ [facility].  PATIENT SAYS SHE IS GOING TO CALL THEM AGAIN.  LELA STURDIVANT NTII  10-04-012  1:57PM

## 2010-11-04 LAB — CBC
HCT: 29 — ABNORMAL LOW
HCT: 30 — ABNORMAL LOW
HCT: 30.6 — ABNORMAL LOW
HCT: 30.6 — ABNORMAL LOW
HCT: 32.1 — ABNORMAL LOW
HCT: 34.2 — ABNORMAL LOW
HCT: 34.9 — ABNORMAL LOW
HCT: 35.5 — ABNORMAL LOW
HCT: 35.6 — ABNORMAL LOW
HCT: 36.1
HCT: 37.7
HCT: 33.9 — ABNORMAL LOW
HCT: 35.6 — ABNORMAL LOW
HCT: 38.2
HCT: 45
Hemoglobin: 10.1 — ABNORMAL LOW
Hemoglobin: 10.1 — ABNORMAL LOW
Hemoglobin: 10.6 — ABNORMAL LOW
Hemoglobin: 11.2 — ABNORMAL LOW
Hemoglobin: 11.4 — ABNORMAL LOW
Hemoglobin: 11.6 — ABNORMAL LOW
Hemoglobin: 11.6 — ABNORMAL LOW
Hemoglobin: 11.9 — ABNORMAL LOW
Hemoglobin: 12.3
Hemoglobin: 9.4 — ABNORMAL LOW
Hemoglobin: 9.7 — ABNORMAL LOW
Hemoglobin: 11.4 — ABNORMAL LOW
Hemoglobin: 11.7 — ABNORMAL LOW
Hemoglobin: 12.5
Hemoglobin: 14.8
MCHC: 32.2
MCHC: 32.4
MCHC: 32.7
MCHC: 32.7
MCHC: 32.7
MCHC: 32.7
MCHC: 32.7
MCHC: 32.9
MCHC: 32.9
MCHC: 32.9
MCHC: 33.1
MCHC: 32.7
MCHC: 32.8
MCHC: 32.8
MCHC: 33.6
MCV: 86.6
MCV: 86.8
MCV: 86.8
MCV: 86.9
MCV: 87.1
MCV: 87.6
MCV: 87.7
MCV: 87.8
MCV: 87.8
MCV: 88
MCV: 88.1
MCV: 87.1
MCV: 87.2
MCV: 87.4
MCV: 87.4
Platelets: 244
Platelets: 254
Platelets: 257
Platelets: 258
Platelets: 259
Platelets: 297
Platelets: 326
Platelets: 328
Platelets: 329
Platelets: 330
Platelets: 334
Platelets: 285
Platelets: 298
Platelets: 319
Platelets: 325
RBC: 3.31 — ABNORMAL LOW
RBC: 3.41 — ABNORMAL LOW
RBC: 3.52 — ABNORMAL LOW
RBC: 3.53 — ABNORMAL LOW
RBC: 3.7 — ABNORMAL LOW
RBC: 3.9
RBC: 4.02
RBC: 4.04
RBC: 4.05
RBC: 4.15
RBC: 4.3
RBC: 3.88
RBC: 4.08
RBC: 4.37
RBC: 5.16 — ABNORMAL HIGH
RDW: 16.2 — ABNORMAL HIGH
RDW: 16.5 — ABNORMAL HIGH
RDW: 16.6 — ABNORMAL HIGH
RDW: 16.6 — ABNORMAL HIGH
RDW: 16.9 — ABNORMAL HIGH
RDW: 16.9 — ABNORMAL HIGH
RDW: 17 — ABNORMAL HIGH
RDW: 17.1 — ABNORMAL HIGH
RDW: 17.1 — ABNORMAL HIGH
RDW: 17.2 — ABNORMAL HIGH
RDW: 18 — ABNORMAL HIGH
RDW: 16.5 — ABNORMAL HIGH
RDW: 16.5 — ABNORMAL HIGH
RDW: 16.6 — ABNORMAL HIGH
RDW: 16.6 — ABNORMAL HIGH
WBC: 3.3 — ABNORMAL LOW
WBC: 3.7 — ABNORMAL LOW
WBC: 3.8 — ABNORMAL LOW
WBC: 3.8 — ABNORMAL LOW
WBC: 4.7
WBC: 4.8
WBC: 5.4
WBC: 5.5
WBC: 5.6
WBC: 6.2
WBC: 6.5
WBC: 6.9
WBC: 7
WBC: 7.8
WBC: 8

## 2010-11-04 LAB — OCCULT BLOOD X 1 CARD TO LAB, STOOL
Fecal Occult Bld: NEGATIVE
Fecal Occult Bld: NEGATIVE

## 2010-11-04 LAB — CROSSMATCH
ABO/RH(D): A POS
Antibody Screen: NEGATIVE

## 2010-11-04 LAB — BASIC METABOLIC PANEL
BUN: 2 — ABNORMAL LOW
BUN: 3 — ABNORMAL LOW
BUN: 3 — ABNORMAL LOW
BUN: 4 — ABNORMAL LOW
BUN: <1 — ABNORMAL LOW
BUN: <1 — ABNORMAL LOW
BUN: 3 — ABNORMAL LOW
BUN: 4 — ABNORMAL LOW
CO2: 27
CO2: 27
CO2: 27
CO2: 28
CO2: 31
CO2: 31
CO2: 23
CO2: 25
Calcium: 7.8 — ABNORMAL LOW
Calcium: 8 — ABNORMAL LOW
Calcium: 8.2 — ABNORMAL LOW
Calcium: 8.2 — ABNORMAL LOW
Calcium: 8.7
Calcium: 8.7
Calcium: 7.6 — ABNORMAL LOW
Calcium: 8.1 — ABNORMAL LOW
Chloride: 103
Chloride: 107
Chloride: 108
Chloride: 109
Chloride: 110
Chloride: 110
Chloride: 109
Chloride: 110
Creatinine, Ser: 0.47
Creatinine, Ser: 0.52
Creatinine, Ser: 0.53
Creatinine, Ser: 0.55
Creatinine, Ser: 0.56
Creatinine, Ser: 0.64
Creatinine, Ser: 0.54
Creatinine, Ser: 0.55
GFR calc Af Amer: >60
GFR calc Af Amer: >60
GFR calc Af Amer: >60
GFR calc Af Amer: >60
GFR calc Af Amer: >60
GFR calc Af Amer: >60
GFR calc Af Amer: >60
GFR calc Af Amer: >60
GFR calc non Af Amer: >60
GFR calc non Af Amer: >60
GFR calc non Af Amer: >60
GFR calc non Af Amer: >60
GFR calc non Af Amer: >60
GFR calc non Af Amer: >60
GFR calc non Af Amer: >60
GFR calc non Af Amer: >60
Glucose, Bld: 112 — ABNORMAL HIGH
Glucose, Bld: 75
Glucose, Bld: 80
Glucose, Bld: 87
Glucose, Bld: 87
Glucose, Bld: 90
Glucose, Bld: 78
Glucose, Bld: 80
Potassium: 3.3 — ABNORMAL LOW
Potassium: 3.5
Potassium: 3.8
Potassium: 3.8
Potassium: 4.1
Potassium: 4.2
Potassium: 3.2 — ABNORMAL LOW
Potassium: 3.4 — ABNORMAL LOW
Sodium: 140
Sodium: 140
Sodium: 141
Sodium: 141
Sodium: 144
Sodium: 145
Sodium: 138
Sodium: 140

## 2010-11-04 LAB — COMPREHENSIVE METABOLIC PANEL
ALT: 16
ALT: 19
ALT: 14
ALT: 34
AST: 34
AST: 37
AST: 22
AST: 54 — ABNORMAL HIGH
Albumin: 2.2 — ABNORMAL LOW
Albumin: 2.3 — ABNORMAL LOW
Albumin: 2.6 — ABNORMAL LOW
Albumin: 3.3 — ABNORMAL LOW
Alkaline Phosphatase: 103
Alkaline Phosphatase: 119 — ABNORMAL HIGH
Alkaline Phosphatase: 106
Alkaline Phosphatase: 107
BUN: 2 — ABNORMAL LOW
BUN: 4 — ABNORMAL LOW
BUN: 6
BUN: 9
CO2: 28
CO2: 30
CO2: 25
CO2: 27
Calcium: 8.4
Calcium: 8.6
Calcium: 8.4
Calcium: 9.3
Chloride: 102
Chloride: 106
Chloride: 101
Chloride: 98
Creatinine, Ser: 0.62
Creatinine, Ser: 0.7
Creatinine, Ser: 0.66
Creatinine, Ser: 0.71
GFR calc Af Amer: >60
GFR calc Af Amer: >60
GFR calc Af Amer: >60
GFR calc Af Amer: >60
GFR calc non Af Amer: >60
GFR calc non Af Amer: >60
GFR calc non Af Amer: >60
GFR calc non Af Amer: >60
Glucose, Bld: 106 — ABNORMAL HIGH
Glucose, Bld: 98
Glucose, Bld: 116 — ABNORMAL HIGH
Glucose, Bld: 91
Potassium: 4.1
Potassium: 4.4
Potassium: 2.8 — ABNORMAL LOW
Potassium: 3.2 — ABNORMAL LOW
Sodium: 136
Sodium: 142
Sodium: 134 — ABNORMAL LOW
Sodium: 136
Total Bilirubin: 0.5
Total Bilirubin: 0.7
Total Bilirubin: 0.3
Total Bilirubin: 0.5
Total Protein: 4.5 — ABNORMAL LOW
Total Protein: 4.5 — ABNORMAL LOW
Total Protein: 5.2 — ABNORMAL LOW
Total Protein: 6.2

## 2010-11-04 LAB — VANCOMYCIN, TROUGH: Vancomycin Tr: 8.4

## 2010-11-04 LAB — DIFFERENTIAL
Basophils Absolute: 0
Basophils Relative: 1
Eosinophils Absolute: 0 — ABNORMAL LOW
Eosinophils Relative: 0
Lymphocytes Relative: 32
Lymphs Abs: 2.3
Monocytes Absolute: 0.5
Monocytes Relative: 7
Neutro Abs: 4.2
Neutrophils Relative %: 60

## 2010-11-04 LAB — WOUND CULTURE: Culture: NO GROWTH

## 2010-11-04 LAB — PROTIME-INR
INR: 0.9
Prothrombin Time: 12.6

## 2010-11-04 LAB — MAGNESIUM: Magnesium: 1.8

## 2010-11-04 LAB — URINE CULTURE
Colony Count: NO GROWTH
Culture: NO GROWTH
Special Requests: NEGATIVE

## 2010-11-04 LAB — APTT: aPTT: 31

## 2010-11-04 LAB — URINALYSIS, ROUTINE W REFLEX MICROSCOPIC
Bilirubin Urine: NEGATIVE
Glucose, UA: NEGATIVE
Hgb urine dipstick: NEGATIVE
Ketones, ur: NEGATIVE
Nitrite: NEGATIVE
Protein, ur: NEGATIVE
Specific Gravity, Urine: 1.007
Urobilinogen, UA: 0.2
pH: 6.5

## 2010-11-04 LAB — GRAM STAIN

## 2010-11-04 LAB — AMYLASE: Amylase: 48

## 2010-11-04 LAB — SAMPLE TO BLOOD BANK

## 2010-11-04 LAB — RAPID STREP SCREEN (MED CTR MEBANE ONLY): Streptococcus, Group A Screen (Direct): NEGATIVE

## 2010-11-06 LAB — CBC
HCT: 21.6 — ABNORMAL LOW
HCT: 28.4 — ABNORMAL LOW
HCT: 29.9 — ABNORMAL LOW
HCT: 30.5 — ABNORMAL LOW
HCT: 32.5 — ABNORMAL LOW
HCT: 33.4 — ABNORMAL LOW
HCT: 33.8 — ABNORMAL LOW
HCT: 34.2 — ABNORMAL LOW
HCT: 34.6 — ABNORMAL LOW
HCT: 35.5 — ABNORMAL LOW
HCT: 36.3
Hemoglobin: 10.2 — ABNORMAL LOW
Hemoglobin: 10.3 — ABNORMAL LOW
Hemoglobin: 11.1 — ABNORMAL LOW
Hemoglobin: 11.2 — ABNORMAL LOW
Hemoglobin: 11.4 — ABNORMAL LOW
Hemoglobin: 11.5 — ABNORMAL LOW
Hemoglobin: 11.6 — ABNORMAL LOW
Hemoglobin: 11.9 — ABNORMAL LOW
Hemoglobin: 12.1
Hemoglobin: 7.2 — CL
Hemoglobin: 9.4 — ABNORMAL LOW
MCHC: 33.2
MCHC: 33.3
MCHC: 33.3
MCHC: 33.3
MCHC: 33.5
MCHC: 33.6
MCHC: 33.6
MCHC: 33.7
MCHC: 33.8
MCHC: 34
MCHC: 34.2
MCV: 90.7
MCV: 90.7
MCV: 90.7
MCV: 91.1
MCV: 91.5
MCV: 91.7
MCV: 91.9
MCV: 92.3
MCV: 92.4
MCV: 94.6
MCV: 94.6
Platelets: 223
Platelets: 230
Platelets: 240
Platelets: 247
Platelets: 253
Platelets: 257
Platelets: 262
Platelets: 270
Platelets: 285
Platelets: 290
Platelets: 333
RBC: 2.29 — ABNORMAL LOW
RBC: 3.01 — ABNORMAL LOW
RBC: 3.29 — ABNORMAL LOW
RBC: 3.36 — ABNORMAL LOW
RBC: 3.58 — ABNORMAL LOW
RBC: 3.65 — ABNORMAL LOW
RBC: 3.69 — ABNORMAL LOW
RBC: 3.71 — ABNORMAL LOW
RBC: 3.74 — ABNORMAL LOW
RBC: 3.86 — ABNORMAL LOW
RBC: 3.98
RDW: 14.9 — ABNORMAL HIGH
RDW: 14.9 — ABNORMAL HIGH
RDW: 15 — ABNORMAL HIGH
RDW: 15.2 — ABNORMAL HIGH
RDW: 15.2 — ABNORMAL HIGH
RDW: 15.2 — ABNORMAL HIGH
RDW: 15.5 — ABNORMAL HIGH
RDW: 15.7 — ABNORMAL HIGH
RDW: 15.9 — ABNORMAL HIGH
RDW: 15.9 — ABNORMAL HIGH
RDW: 16.4 — ABNORMAL HIGH
WBC: 3.8 — ABNORMAL LOW
WBC: 4.2
WBC: 4.5
WBC: 4.6
WBC: 4.7
WBC: 4.9
WBC: 5
WBC: 5.7
WBC: 5.7
WBC: 6.2
WBC: 8.4

## 2010-11-06 LAB — COMPREHENSIVE METABOLIC PANEL
ALT: 11
AST: 24
Albumin: 3.5
Alkaline Phosphatase: 66
BUN: 17
CO2: 29
Calcium: 9.1
Chloride: 96
Creatinine, Ser: 1.05
GFR calc Af Amer: >60
GFR calc non Af Amer: 53 — ABNORMAL LOW
Glucose, Bld: 116 — ABNORMAL HIGH
Potassium: 3.1 — ABNORMAL LOW
Sodium: 136
Total Bilirubin: 0.4
Total Protein: 6.4

## 2010-11-06 LAB — BASIC METABOLIC PANEL
BUN: 1 — ABNORMAL LOW
BUN: 1 — ABNORMAL LOW
BUN: 3 — ABNORMAL LOW
BUN: <1 — ABNORMAL LOW
BUN: <1 — ABNORMAL LOW
BUN: <1 — ABNORMAL LOW
CO2: 21
CO2: 26
CO2: 26
CO2: 28
CO2: 28
CO2: 32
Calcium: 7.9 — ABNORMAL LOW
Calcium: 8.3 — ABNORMAL LOW
Calcium: 8.4
Calcium: 8.5
Calcium: 9
Calcium: 9.2
Chloride: 103
Chloride: 104
Chloride: 105
Chloride: 107
Chloride: 108
Chloride: 110
Creatinine, Ser: 0.6
Creatinine, Ser: 0.62
Creatinine, Ser: 0.69
Creatinine, Ser: 0.7
Creatinine, Ser: 0.71
Creatinine, Ser: 0.74
GFR calc Af Amer: >60
GFR calc Af Amer: >60
GFR calc Af Amer: >60
GFR calc Af Amer: >60
GFR calc Af Amer: >60
GFR calc Af Amer: >60
GFR calc non Af Amer: >60
GFR calc non Af Amer: >60
GFR calc non Af Amer: >60
GFR calc non Af Amer: >60
GFR calc non Af Amer: >60
GFR calc non Af Amer: >60
Glucose, Bld: 102 — ABNORMAL HIGH
Glucose, Bld: 103 — ABNORMAL HIGH
Glucose, Bld: 104 — ABNORMAL HIGH
Glucose, Bld: 118 — ABNORMAL HIGH
Glucose, Bld: 74
Glucose, Bld: 93
Potassium: 4.2
Potassium: 4.4
Potassium: 4.7
Potassium: 4.8
Potassium: 4.9
Potassium: 5
Sodium: 136
Sodium: 139
Sodium: 140
Sodium: 142
Sodium: 142
Sodium: 143

## 2010-11-06 LAB — URINALYSIS, ROUTINE W REFLEX MICROSCOPIC
Bilirubin Urine: NEGATIVE
Glucose, UA: NEGATIVE
Hgb urine dipstick: NEGATIVE
Ketones, ur: NEGATIVE
Nitrite: NEGATIVE
Protein, ur: NEGATIVE
Specific Gravity, Urine: 1.023
Urobilinogen, UA: 0.2
pH: 6

## 2010-11-06 LAB — URINE CULTURE: Colony Count: 3000

## 2010-11-06 LAB — CROSSMATCH
ABO/RH(D): A POS
Antibody Screen: NEGATIVE

## 2010-11-06 LAB — DIFFERENTIAL
Basophils Absolute: 0
Basophils Relative: 1
Eosinophils Absolute: 0
Eosinophils Relative: 0
Lymphocytes Relative: 22
Lymphs Abs: 1.8
Monocytes Absolute: 0.5
Monocytes Relative: 6
Neutro Abs: 6.1
Neutrophils Relative %: 72

## 2010-11-06 LAB — HEPATITIS PANEL, ACUTE
HCV Ab: NEGATIVE
Hep A IgM: NEGATIVE
Hep B C IgM: NEGATIVE
Hepatitis B Surface Ag: NEGATIVE

## 2010-11-06 LAB — MAGNESIUM: Magnesium: 2.5

## 2010-11-06 LAB — FOLATE RBC: RBC Folate: 564

## 2010-11-06 LAB — VITAMIN B12: Vitamin B-12: 193 — ABNORMAL LOW (ref 211–911)

## 2010-11-06 LAB — LIPASE, BLOOD: Lipase: 22

## 2010-11-06 LAB — PHOSPHORUS: Phosphorus: 3

## 2010-11-06 LAB — ABO/RH: ABO/RH(D): A POS

## 2010-11-06 LAB — H. PYLORI ANTIBODY, IGG: H Pylori IgG: <0.4

## 2010-12-17 ENCOUNTER — Other Ambulatory Visit: Payer: Self-pay | Admitting: Internal Medicine

## 2010-12-22 NOTE — Telephone Encounter (Signed)
Called to pharmacy on 12/17/2010 5:10 PM.  Angelina Ok, RN 12/17/2010 5:12 PM

## 2010-12-30 ENCOUNTER — Telehealth: Payer: Self-pay | Admitting: *Deleted

## 2010-12-30 NOTE — Telephone Encounter (Signed)
TALKED WITH [facility] OFFICE, [name] NEVER CALLED BACK TO TALK ABOUT HER BILL. THERE FORE  I AM UNABLE TO MAKE THIS REFERRAL UNTIL THIS IS TAKEN CARE OF. [name] Bell NTII   12-4-012  2:41PM.

## 2010-12-30 NOTE — Progress Notes (Signed)
Addended by: Neomia Dear on: 12/30/2010 05:08 PM   Modules accepted: Orders

## 2011-01-15 ENCOUNTER — Other Ambulatory Visit: Payer: Self-pay | Admitting: Internal Medicine

## 2011-03-25 ENCOUNTER — Encounter (HOSPITAL_COMMUNITY): Payer: Self-pay | Admitting: *Deleted

## 2011-03-25 ENCOUNTER — Emergency Department (INDEPENDENT_AMBULATORY_CARE_PROVIDER_SITE_OTHER)
Admission: EM | Admit: 2011-03-25 | Discharge: 2011-03-25 | Disposition: A | Discharge status: Home / Self Care | Payer: Medicare Other | Source: Home / Self Care | Attending: Family Medicine | Admitting: Family Medicine

## 2011-03-25 ENCOUNTER — Emergency Department (HOSPITAL_COMMUNITY)
Admission: EM | Admit: 2011-03-25 | Discharge: 2011-03-25 | Disposition: A | Discharge status: Home / Self Care | Payer: Medicare Other | Source: Home / Self Care

## 2011-03-25 DIAGNOSIS — L259 Unspecified contact dermatitis, unspecified cause: Secondary | ICD-10-CM

## 2011-03-25 MED ORDER — FLUTICASONE PROPIONATE 0.05 % EX CREA: TOPICAL_CREAM | Freq: Two times a day (BID) | CUTANEOUS | Status: DC

## 2011-03-25 NOTE — Discharge Instructions (Signed)
Leave finger bandaged for 24 hrs then soak as discussed twice a day and apply cream until healed, return as needed.

## 2011-03-25 NOTE — ED Notes (Signed)
Pt  Reports  She  Sustained  A  Lac     To   r  Middle  Finger  2  Weeks  Ago  With a  [employer]  Device         She    Has  Been        Applying  [employer]  To the  Area      It has  A    Blister        And   Some  Drainage  Present

## 2011-03-25 NOTE — ED Notes (Signed)
Last  Tetanus  Shot 4  Years  Ago  According to pt

## 2011-03-25 NOTE — ED Provider Notes (Signed)
History     CSN: [id number]  Arrival date & time 03/25/11  1657   First MD Initiated Contact with Patient 03/25/11 1705      Chief Complaint  Patient presents with  . Wound Check    (Consider location/radiation/quality/duration/timing/severity/associated sxs/prior treatment) Patient is a 66 y.o. female presenting with wound check. The history is provided by the patient.  Wound Check  Treated in ED: cut tip of rmf on meat slicer at home, ,itching began after adhesive bandage, then used witch hazel , itching and blister continues. There has been clear discharge from the wound. The swelling has not changed. The pain has not changed.    Past Medical History  Diagnosis Date  . Superior mesenteric artery syndrome 11/08    sp dilation by Dr. Ewing Schlein, Pt may require bowel resetion if sx recur  . Duodenal ulcer 11/08    With hemorrhage and obstruction  . Osteomyelitis, jaw acute 11/08    Started while in the hospital abcess showed GNR . SP debridement by Dr. Neoma Laming,  Ernesta Amble S Bovis sp 4  weeks of Pen V started on 05/19/07  with additional 2 weeks in 07/04/07  . Degenerative disk disease     This is interscapular, mild compression frx of T12 superior endplate with Schmorl's node. Seen on CT in 2/08  . Back pain   . Depression     History reviewed. No pertinent past surgical history.  Family History  Problem Relation Age of Onset  . Cancer Mother   . Heart disease Father   . Diabetes Sister     History  Substance Use Topics  . Smoking status: Current Some Day Smoker -- 0.3 packs/day for 10 years    Types: Cigarettes  . Smokeless tobacco: Not on file  . Alcohol Use: No    OB History    Grav Para Term Preterm Abortions TAB SAB Ect Mult Living                  Review of Systems  Constitutional: Negative.   Skin: Positive for rash and wound.    Allergies  Ibuprofen and Nsaids  Home Medications   Current Outpatient Rx  Name Route Sig Dispense Refill  . ALBUTEROL 90  MCG/ACT IN AERS Inhalation Inhale 2 puffs into the lungs every 4 (four) hours as needed.      . ALBUTEROL SULFATE HFA 108 (90 BASE) MCG/ACT IN AERS Inhalation Inhale 2 puffs into the lungs every 4 (four) hours as needed for wheezing. 1 Inhaler 3  . BUPROPION HCL 100 MG PO TABS  TAKE ONE TABLET BY MOUTH THREE TIMES DAILY 90 tablet 3  . CALCIUM CARBONATE-VITAMIN D 500-200 MG-UNIT PO TABS Oral Take 1 tablet by mouth 3 (three) times daily.      Marland Kitchen DOCUSATE SODIUM 100 MG PO CAPS Oral Take 100 mg by mouth daily as needed.      Marland Kitchen ESOMEPRAZOLE MAGNESIUM 40 MG PO CPDR Oral Take 1 capsule (40 mg total) by mouth 2 (two) times daily. 180 capsule 3    **Patient requests 90 day supply**  . FLUTICASONE PROPIONATE 0.05 % EX CREA Topical Apply topically 2 (two) times daily. 30 g 0  . GABAPENTIN 600 MG PO TABS  TAKE 1 TABLET BY MOUTH THREE TIMES DAILY 270 tablet 0    **Patient requests 90 day supply**  . HYDROCODONE-ACETAMINOPHEN 7.5-750 MG PO TABS  TAKE 1 TABLET BY MOUTH EVERY 4 HOURS AS NEEDED FOR PAIN 150 tablet 5  .  HYDROXYZINE HCL 25 MG PO TABS Oral Take 25 mg by mouth every 6 (six) hours as needed. For itching     . ONDANSETRON HCL 4 MG PO TABS  TAKE 1 TABLET BY MOUTH EVERY 8 HOURS AS NEEDED FOR NAUSEA 90 tablet 0  . PRAVASTATIN SODIUM 80 MG PO TABS Oral Take 1 tablet (80 mg total) by mouth daily. 30 tablet 11  . SYRINGE/NEEDLE (DISP) 21G X 1-1/2" 3 ML MISC Does not apply by Does not apply route.        BP 160/76  Pulse 78  Temp 98.6 F (37 C)  Resp 18  SpO2 100%  LMP 08/25/1980  Physical Exam  Nursing note and vitals reviewed. Constitutional: She is oriented to person, place, and time. She appears well-developed and well-nourished.  Neurological: She is alert and oriented to person, place, and time.  Skin: Skin is warm and dry. Rash noted.       Well demarcated, clear fluid filled blistering rash to distal phalanx of rmf, no erythema or purulent fluid.    ED Course  Debridement Date/Time:  03/25/2011 6:03 PM Performed by: Barkley Bruns Authorized by: Barkley Bruns Consent: Verbal consent obtained. Consent given by: patient Preparation: Patient was prepped and draped in the usual sterile fashion. Local anesthesia used: no Patient sedated: no Patient tolerance: Patient tolerated the procedure well with no immediate complications. Comments: Skin opened and min debridement, betadine soaked, xeroform dsd.   (including critical care time)   Labs Reviewed  WOUND CULTURE   No results found.   1. Contact dermatitis       MDM          Barkley Bruns, MD 03/25/11 [id number]

## 2011-03-28 LAB — WOUND CULTURE

## 2011-04-02 ENCOUNTER — Encounter: Payer: Self-pay | Admitting: Internal Medicine

## 2011-04-02 ENCOUNTER — Ambulatory Visit (INDEPENDENT_AMBULATORY_CARE_PROVIDER_SITE_OTHER): Payer: Medicare Other | Admitting: Internal Medicine

## 2011-04-02 VITALS — BP 130/74 | HR 85 | Temp 97.3°F | Ht 63.0 in | Wt 170.2 lb

## 2011-04-02 DIAGNOSIS — L02519 Cutaneous abscess of unspecified hand: Secondary | ICD-10-CM | POA: Diagnosis not present

## 2011-04-02 DIAGNOSIS — L089 Local infection of the skin and subcutaneous tissue, unspecified: Secondary | ICD-10-CM

## 2011-04-02 DIAGNOSIS — Z23 Encounter for immunization: Secondary | ICD-10-CM | POA: Diagnosis not present

## 2011-04-02 DIAGNOSIS — L03019 Cellulitis of unspecified finger: Secondary | ICD-10-CM | POA: Diagnosis not present

## 2011-04-02 MED ORDER — OXYCODONE-ACETAMINOPHEN 5-325 MG PO TABS: 1.0000 | ORAL_TABLET | ORAL | Status: AC | PRN

## 2011-04-02 MED ORDER — SULFAMETHOXAZOLE-TRIMETHOPRIM 800-160 MG PO TABS: 1.0000 | ORAL_TABLET | Freq: Two times a day (BID) | ORAL | Status: DC

## 2011-04-02 NOTE — Patient Instructions (Signed)
Take your antibiotics as prescribed and drink plenty of fluids. Please follow up with Dr. Izora Ribas tomorrow morning at 9.00 AM. Please call his office at [phone] for directions and information.

## 2011-04-03 DIAGNOSIS — IMO0002 Reserved for concepts with insufficient information to code with codable children: Secondary | ICD-10-CM | POA: Diagnosis not present

## 2011-04-03 NOTE — Progress Notes (Signed)
  Subjective:    Patient ID: [name] Bell, female    DOB: 1945-02-15, 66 y.o.   MRN: [id number]  HPI Patient is 66 yo women who is here today for ER follow up of her right middle finger abscess. Patient was seen in the ED for excruciating right middle finger pain and swelling and it was drained by performing an incision at the tip of the finger. Patient was given topical steroid cream and was not started on any antibiotics.   Patient is still complaining of excruciating pain in the finger. The pain is exacerbated by touch and relieved with nothing. She thinks that the pain is progressively getting worse.   Review of Systems  Constitutional: Negative for fever, activity change and appetite change.  HENT: Negative for sore throat.   Respiratory: Negative for cough and shortness of breath.   Cardiovascular: Negative for chest pain and leg swelling.  Gastrointestinal: Negative for nausea, abdominal pain, diarrhea, constipation and abdominal distention.  Genitourinary: Negative for frequency, hematuria and difficulty urinating.  Musculoskeletal:       Unable to flex the distal phalynx of right middle finger.  Skin:       Macerated skin over the distal phalyx of right middle finger.  Neurological: Negative for dizziness and headaches.  Psychiatric/Behavioral: Negative for suicidal ideas and behavioral problems.       Objective:   Physical Exam  Constitutional: She is oriented to person, place, and time. She appears well-developed and well-nourished.  HENT:  Head: Normocephalic and atraumatic.  Eyes: Conjunctivae and EOM are normal. Pupils are equal, round, and reactive to light. No scleral icterus.  Neck: Normal range of motion. Neck supple. No JVD present. No thyromegaly present.  Cardiovascular: Normal rate, regular rhythm, normal heart sounds and intact distal pulses.  Exam reveals no gallop and no friction rub.   No murmur heard. Pulmonary/Chest: Effort normal and breath sounds  normal. No respiratory distress. She has no wheezes. She has no rales.  Abdominal: Soft. Bowel sounds are normal. She exhibits no distension and no mass. There is no tenderness. There is no rebound and no guarding.  Musculoskeletal: Normal range of motion. She exhibits no edema and no tenderness.       Arms: Lymphadenopathy:    She has no cervical adenopathy.  Neurological: She is alert and oriented to person, place, and time.  Psychiatric: She has a normal mood and affect. Her behavior is normal.          Assessment & Plan:

## 2011-04-09 NOTE — Assessment & Plan Note (Addendum)
With continued redness and tenderness, the finger looked concerning to me for continued infection. I called Dr. Tanna Furry the hand surgeon on call and asked him for advise. He advised to try and squeeze the finger and note for any pus which I did not notice. I gave her an appointment with Dr. Tanna Furry the next day and started her on bactrim DS bid. I will have her come back to clinic for follow up.  Patient was agreeable with the plan. I also gave her 30 tabs of roxicet for pain control.

## 2011-04-20 ENCOUNTER — Other Ambulatory Visit: Payer: Self-pay | Admitting: Internal Medicine

## 2011-05-04 ENCOUNTER — Encounter: Payer: Self-pay | Admitting: Internal Medicine

## 2011-05-04 ENCOUNTER — Ambulatory Visit (INDEPENDENT_AMBULATORY_CARE_PROVIDER_SITE_OTHER): Payer: Medicare Other | Admitting: Internal Medicine

## 2011-05-04 VITALS — BP 137/76 | HR 99 | Temp 96.9°F | Ht 64.0 in | Wt 165.8 lb

## 2011-05-04 DIAGNOSIS — J019 Acute sinusitis, unspecified: Secondary | ICD-10-CM | POA: Diagnosis not present

## 2011-05-04 MED ORDER — AZITHROMYCIN 250 MG PO TABS: ORAL_TABLET | ORAL | Status: AC

## 2011-05-04 MED ORDER — LORATADINE-PSEUDOEPHEDRINE ER 5-120 MG PO TB12: 1.0000 | ORAL_TABLET | Freq: Two times a day (BID) | ORAL | Status: DC

## 2011-05-04 MED ORDER — FLUTICASONE PROPIONATE 50 MCG/ACT NA SUSP: 1.0000 | Freq: Every day | NASAL | Status: DC

## 2011-05-04 NOTE — Progress Notes (Signed)
Patient ID: [name] Bell, female   DOB: 14-Dec-1945, 66 y.o.   MRN: [id number] HPI:    Patient is here left facial swelling, left ear ache and runny nose. Reports 'blowing "greens stuff" from her nose. Denies any fever, chills but feels "warm. Denies nay visual changes, HA, rash or any other Sx. Review of Systems: Negative except per history of present illness  Physical Exam:  Nursing notes and vitals reviewed General:  alert, well-developed, and cooperative to examination.   Lungs:  normal respiratory effort, no accessory muscle use, normal breath sounds, no crackles, and no wheezes. Heart:  normal rate, regular rhythm, no murmurs, no gallop, and no rub.   Abdomen:  soft, non-tender, normal bowel sounds, no distention, no guarding, no rebound tenderness, no hepatomegaly, and no splenomegaly.   Extremities:  No cyanosis, clubbing, edema Neurologic:  alert & oriented X3, nonfocal exam  Meds: Medications Prior to Admission  Medication Sig Dispense Refill  . albuterol (PROVENTIL) 90 MCG/ACT inhaler Inhale 2 puffs into the lungs every 4 (four) hours as needed.        Marland Kitchen albuterol (VENTOLIN HFA) 108 (90 BASE) MCG/ACT inhaler Inhale 2 puffs into the lungs every 4 (four) hours as needed for wheezing.  1 Inhaler  3  . buPROPion (WELLBUTRIN) 100 MG tablet TAKE ONE TABLET BY MOUTH THREE TIMES DAILY  90 tablet  3  . calcium-vitamin D (OSCAL WITH D 500-200) 500-200 MG-UNIT per tablet Take 1 tablet by mouth 3 (three) times daily.        Marland Kitchen docusate sodium (COLACE) 100 MG capsule Take 100 mg by mouth daily as needed.        Marland Kitchen esomeprazole (NEXIUM) 40 MG capsule Take 1 capsule (40 mg total) by mouth 2 (two) times daily.  180 capsule  3  . fluticasone (CUTIVATE) 0.05 % cream Apply topically 2 (two) times daily.  30 g  0  . gabapentin (NEURONTIN) 600 MG tablet TAKE 1 TABLET BY MOUTH THREE TIMES DAILY  270 tablet  0  . HYDROcodone-acetaminophen (VICODIN ES) 7.5-750 MG per tablet TAKE 1 TABLET BY MOUTH EVERY 4  HOURS AS NEEDED FOR PAIN  150 tablet  5  . hydrOXYzine (ATARAX) 25 MG tablet Take 25 mg by mouth every 6 (six) hours as needed. For itching       . ondansetron (ZOFRAN) 4 MG tablet TAKE 1 TABLET BY MOUTH EVERY 8 HOURS AS NEEDED FOR NAUSEA  90 tablet  3  . pravastatin (PRAVACHOL) 80 MG tablet Take 1 tablet (80 mg total) by mouth daily.  30 tablet  11  . sulfamethoxazole-trimethoprim (BACTRIM DS) 800-160 MG per tablet Take 1 tablet by mouth 2 (two) times daily.  14 tablet  0  . SYRINGE-NEEDLE, DISP, 3 ML (B-D 3CC LUER-LOK SYR 21GX1-1/2) 21G X 1-1/2" 3 ML MISC by Does not apply route.         No current facility-administered medications on file as of 05/04/2011.    Allergies: Ibuprofen and Nsaids Past Medical History  Diagnosis Date  . Superior mesenteric artery syndrome 11/08    sp dilation by Dr. Ewing Schlein, Pt may require bowel resetion if sx recur  . Duodenal ulcer 11/08    With hemorrhage and obstruction  . Osteomyelitis, jaw acute 11/08    Started while in the hospital abcess showed GNR . SP debridement by Dr. Neoma Laming,  Ernesta Amble S Bovis sp 4  weeks of Pen V started on 05/19/07  with additional 2 weeks in 07/04/07  .  Degenerative disk disease     This is interscapular, mild compression frx of T12 superior endplate with Schmorl's node. Seen on CT in 2/08  . Back pain   . Depression    No past surgical history on file. Family History  Problem Relation Age of Onset  . Cancer Mother   . Heart disease Father   . Diabetes Sister    History   Social History  . Marital Status: Divorced    Spouse Name: N/A    Number of Children: N/A  . Years of Education: N/A   Occupational History  . Not on file.   Social History Main Topics  . Smoking status: Current Some Day Smoker -- 0.3 packs/day for 10 years    Types: Cigarettes  . Smokeless tobacco: Not on file  . Alcohol Use: No  . Drug Use: No  . Sexually Active: Not on file   Other Topics Concern  . Not on file   Social History Narrative     Pt now lives by herself in an ALF [facility]) where she can come and go as she pleases.  A good friend named [name] still looks out for her daily.     A/p: 1.Acute sinusitis -Zithromax -flonase NS -claritin D 12 hr i PO bid PRN -stop smoking   2. HTN, poorlty controlled today. Likely due to Px #1. -return to clinic in 1-2 weeks for a BP recheck -stop smoking

## 2011-05-04 NOTE — Patient Instructions (Signed)
Please, pick up your prescriptions at the pharmacy. Please, STOP SMOKING!!!! Call with any concerns and follow up 7 days if not feeling better.

## 2011-06-29 ENCOUNTER — Other Ambulatory Visit: Payer: Self-pay | Admitting: *Deleted

## 2011-06-29 DIAGNOSIS — J019 Acute sinusitis, unspecified: Secondary | ICD-10-CM

## 2011-06-29 NOTE — Telephone Encounter (Signed)
Pt was called x 2 (# on chart and [id number] from [facility]); no answer; message left to call the clinic. [facility] pharmacy made awared of Dr Donnelly Stager note.

## 2011-06-29 NOTE — Telephone Encounter (Signed)
This was for an acute sinusitis and not meant to be long term. The pseudoephedrine should not be used long term as it has side effects. If she is still having allergy type sxs - runny nose, itchy eyes, sneezing - she can get plain claritin OTC. If worse sxs, needs appt,

## 2011-06-30 ENCOUNTER — Encounter: Payer: Self-pay | Admitting: Internal Medicine

## 2011-06-30 ENCOUNTER — Ambulatory Visit (INDEPENDENT_AMBULATORY_CARE_PROVIDER_SITE_OTHER): Payer: Medicare Other | Admitting: Internal Medicine

## 2011-06-30 VITALS — BP 128/67 | HR 106 | Temp 97.0°F | Ht 64.0 in | Wt 164.3 lb

## 2011-06-30 DIAGNOSIS — J019 Acute sinusitis, unspecified: Secondary | ICD-10-CM

## 2011-06-30 DIAGNOSIS — K260 Acute duodenal ulcer with hemorrhage: Secondary | ICD-10-CM | POA: Diagnosis not present

## 2011-06-30 DIAGNOSIS — M549 Dorsalgia, unspecified: Secondary | ICD-10-CM | POA: Diagnosis not present

## 2011-06-30 DIAGNOSIS — F329 Major depressive disorder, single episode, unspecified: Secondary | ICD-10-CM | POA: Diagnosis not present

## 2011-06-30 DIAGNOSIS — F17200 Nicotine dependence, unspecified, uncomplicated: Secondary | ICD-10-CM | POA: Diagnosis not present

## 2011-06-30 DIAGNOSIS — C44300 Unspecified malignant neoplasm of skin of unspecified part of face: Secondary | ICD-10-CM | POA: Diagnosis not present

## 2011-06-30 HISTORY — DX: Acute sinusitis, unspecified: J01.90

## 2011-06-30 MED ORDER — DOXYCYCLINE HYCLATE 100 MG PO TABS: 100.0000 mg | ORAL_TABLET | Freq: Two times a day (BID) | ORAL | Status: AC

## 2011-06-30 MED ORDER — LORATADINE-PSEUDOEPHEDRINE ER 5-120 MG PO TB12: 1.0000 | ORAL_TABLET | Freq: Two times a day (BID) | ORAL | Status: DC

## 2011-06-30 NOTE — Assessment & Plan Note (Signed)
Well controlled on wellbutrin

## 2011-06-30 NOTE — Assessment & Plan Note (Signed)
Vicodin and gabapentin for back pain.

## 2011-06-30 NOTE — Assessment & Plan Note (Signed)
Removed by dermatologist.

## 2011-06-30 NOTE — Patient Instructions (Signed)

## 2011-06-30 NOTE — Progress Notes (Signed)
  Subjective:    Patient ID: [name] Bell, female    DOB: [birth year], 66 y.o.   MRN: [id number]  HPI Patient is here today for left sided facial pain on the left side. She has had problems with sinusitis in the past and it seems like an exacerbation to her. She No cough, no nasal discharge, she feels itchy in the back of the throat all the time, no fevers, chills but pain is associated with decreased hearing from the left ear.  No other complaints at this time.    Review of Systems  Constitutional: Negative for fever, activity change and appetite change.  HENT: Negative for sore throat.   Respiratory: Negative for cough and shortness of breath.   Cardiovascular: Negative for chest pain and leg swelling.  Gastrointestinal: Negative for nausea, abdominal pain, diarrhea, constipation and abdominal distention.  Genitourinary: Negative for frequency, hematuria and difficulty urinating.  Neurological: Positive for dizziness. Negative for headaches.  Psychiatric/Behavioral: Negative for suicidal ideas and behavioral problems.       Objective:   Physical Exam  Constitutional: She is oriented to person, place, and time. She appears well-developed and well-nourished.  HENT:  Head: Normocephalic and atraumatic.  Right Ear: External ear normal.  Left Ear: External ear normal. No drainage, swelling or tenderness. No foreign bodies. Tympanic membrane is retracted. Tympanic membrane mobility is abnormal. A middle ear effusion is present. No decreased hearing is noted.       Facial tenderness on palpation over maxillary sinus left side.   Eyes: Conjunctivae and EOM are normal. Pupils are equal, round, and reactive to light. No scleral icterus.  Neck: Normal range of motion. Neck supple. No JVD present. No thyromegaly present.  Cardiovascular: Normal rate, regular rhythm, normal heart sounds and intact distal pulses.  Exam reveals no gallop and no friction rub.   No murmur heard. Pulmonary/Chest:  Effort normal and breath sounds normal. No respiratory distress. She has no wheezes. She has no rales.  Abdominal: Soft. Bowel sounds are normal. She exhibits no distension and no mass. There is no tenderness. There is no rebound and no guarding.  Musculoskeletal: Normal range of motion. She exhibits no edema and no tenderness.  Lymphadenopathy:    She has no cervical adenopathy.  Neurological: She is alert and oriented to person, place, and time.  Psychiatric: She has a normal mood and affect. Her behavior is normal.          Assessment & Plan:

## 2011-06-30 NOTE — Assessment & Plan Note (Signed)
Patient has h/o having recurrent sinusitis. This seems like one of the episodes I will treat her with doxycycline for 10 days which works for her. Claritin D Steam inhalation.

## 2011-06-30 NOTE — Assessment & Plan Note (Signed)
Smokes about 4 cigarettes a week. i counseled her about smoking. And she is not willing to quit at this time.

## 2011-06-30 NOTE — Assessment & Plan Note (Signed)
No bleeding noted. She is taking her esomaprazole.bid.

## 2011-07-01 ENCOUNTER — Other Ambulatory Visit: Payer: Self-pay | Admitting: *Deleted

## 2011-07-01 MED ORDER — BUPROPION HCL 100 MG PO TABS: 100.0000 mg | ORAL_TABLET | Freq: Three times a day (TID) | ORAL | Status: DC

## 2011-07-06 ENCOUNTER — Other Ambulatory Visit: Payer: Self-pay | Admitting: Internal Medicine

## 2011-07-06 NOTE — Telephone Encounter (Signed)
Reviewed Dr Theora Gianotti notes and [place] database. Received 25 extra hydrocodone and 30 oxycodone but all from [facility]. Will refill 6 months and forward to Dr Manson Passey.

## 2011-07-06 NOTE — Telephone Encounter (Signed)
Rx called into pharmacy. Message left about med at pharmacy on home ID recording.

## 2011-07-23 ENCOUNTER — Encounter: Payer: Self-pay | Admitting: Internal Medicine

## 2011-07-23 NOTE — Progress Notes (Signed)
This encounter was created in error - please disregard.

## 2011-07-29 ENCOUNTER — Encounter (HOSPITAL_COMMUNITY): Payer: Self-pay

## 2011-07-29 ENCOUNTER — Emergency Department (HOSPITAL_COMMUNITY): Payer: Medicare Other

## 2011-07-29 ENCOUNTER — Inpatient Hospital Stay (HOSPITAL_COMMUNITY)
Admission: EM | Admit: 2011-07-29 | Discharge: 2011-08-11 | DRG: 329 | Disposition: A | Discharge status: Skilled Nursing Facility | Payer: Medicare Other | Attending: General Surgery | Admitting: General Surgery

## 2011-07-29 DIAGNOSIS — Z833 Family history of diabetes mellitus: Secondary | ICD-10-CM

## 2011-07-29 DIAGNOSIS — E785 Hyperlipidemia, unspecified: Secondary | ICD-10-CM | POA: Diagnosis present

## 2011-07-29 DIAGNOSIS — E669 Obesity, unspecified: Secondary | ICD-10-CM | POA: Diagnosis present

## 2011-07-29 DIAGNOSIS — R5381 Other malaise: Secondary | ICD-10-CM | POA: Diagnosis present

## 2011-07-29 DIAGNOSIS — J449 Chronic obstructive pulmonary disease, unspecified: Secondary | ICD-10-CM

## 2011-07-29 DIAGNOSIS — F17200 Nicotine dependence, unspecified, uncomplicated: Secondary | ICD-10-CM | POA: Diagnosis present

## 2011-07-29 DIAGNOSIS — K650 Generalized (acute) peritonitis: Secondary | ICD-10-CM | POA: Diagnosis present

## 2011-07-29 DIAGNOSIS — K5720 Diverticulitis of large intestine with perforation and abscess without bleeding: Secondary | ICD-10-CM | POA: Diagnosis present

## 2011-07-29 DIAGNOSIS — Z79899 Other long term (current) drug therapy: Secondary | ICD-10-CM

## 2011-07-29 DIAGNOSIS — K260 Acute duodenal ulcer with hemorrhage: Secondary | ICD-10-CM | POA: Diagnosis present

## 2011-07-29 DIAGNOSIS — R109 Unspecified abdominal pain: Secondary | ICD-10-CM | POA: Diagnosis not present

## 2011-07-29 DIAGNOSIS — Z91040 Latex allergy status: Secondary | ICD-10-CM

## 2011-07-29 DIAGNOSIS — Z8249 Family history of ischemic heart disease and other diseases of the circulatory system: Secondary | ICD-10-CM

## 2011-07-29 DIAGNOSIS — K631 Perforation of intestine (nontraumatic): Principal | ICD-10-CM | POA: Diagnosis present

## 2011-07-29 DIAGNOSIS — Z6829 Body mass index (BMI) 29.0-29.9, adult: Secondary | ICD-10-CM

## 2011-07-29 DIAGNOSIS — Z886 Allergy status to analgesic agent status: Secondary | ICD-10-CM

## 2011-07-29 DIAGNOSIS — Z79891 Long term (current) use of opiate analgesic: Secondary | ICD-10-CM | POA: Diagnosis present

## 2011-07-29 DIAGNOSIS — F329 Major depressive disorder, single episode, unspecified: Secondary | ICD-10-CM | POA: Diagnosis present

## 2011-07-29 DIAGNOSIS — R1110 Vomiting, unspecified: Secondary | ICD-10-CM | POA: Diagnosis not present

## 2011-07-29 DIAGNOSIS — E538 Deficiency of other specified B group vitamins: Secondary | ICD-10-CM | POA: Diagnosis present

## 2011-07-29 DIAGNOSIS — G894 Chronic pain syndrome: Secondary | ICD-10-CM | POA: Diagnosis present

## 2011-07-29 DIAGNOSIS — F3289 Other specified depressive episodes: Secondary | ICD-10-CM | POA: Diagnosis present

## 2011-07-29 DIAGNOSIS — K660 Peritoneal adhesions (postprocedural) (postinfection): Secondary | ICD-10-CM | POA: Diagnosis present

## 2011-07-29 DIAGNOSIS — M810 Age-related osteoporosis without current pathological fracture: Secondary | ICD-10-CM | POA: Diagnosis present

## 2011-07-29 DIAGNOSIS — K5900 Constipation, unspecified: Secondary | ICD-10-CM | POA: Diagnosis present

## 2011-07-29 DIAGNOSIS — J4489 Other specified chronic obstructive pulmonary disease: Secondary | ICD-10-CM | POA: Diagnosis not present

## 2011-07-29 HISTORY — DX: Acute sinusitis, unspecified: J01.90

## 2011-07-29 HISTORY — DX: Vascular disorder of intestine, unspecified: K55.9

## 2011-07-29 LAB — CBC WITH DIFFERENTIAL/PLATELET
HCT: 41.2 % (ref 36.0–46.0)
Hemoglobin: 14.1 g/dL (ref 12.0–15.0)
Lymphocytes Relative: 24 % (ref 12–46)
Lymphs Abs: 0.7 10*3/uL (ref 0.7–4.0)
MCHC: 34.2 g/dL (ref 30.0–36.0)
Monocytes Absolute: 0.2 10*3/uL (ref 0.1–1.0)
Monocytes Relative: 6 % (ref 3–12)
Neutro Abs: 1.9 10*3/uL (ref 1.7–7.7)
Neutrophils Relative %: 70 % (ref 43–77)
RBC: 4.33 MIL/uL (ref 3.87–5.11)
WBC: 2.7 10*3/uL — ABNORMAL LOW (ref 4.0–10.5)

## 2011-07-29 LAB — OCCULT BLOOD, POC DEVICE: Fecal Occult Bld: NEGATIVE

## 2011-07-29 MED ORDER — MORPHINE SULFATE 4 MG/ML IJ SOLN
4.0000 mg | Freq: Once | INTRAMUSCULAR | Status: AC
  Administered 2011-07-30: 4 mg via INTRAVENOUS
  Filled 2011-07-29: qty 1

## 2011-07-29 NOTE — ED Provider Notes (Signed)
History     CSN: [id number]  Arrival date & time 07/29/11  2149   First MD Initiated Contact with Patient 07/29/11 2303      Chief Complaint  Patient presents with  . Constipation  . Abdominal Pain    (Consider location/radiation/quality/duration/timing/severity/associated sxs/prior treatment) Patient is a 66 y.o. female presenting with constipation and abdominal pain. The history is provided by the patient.  Constipation  Episode onset: 4-5 days ago. The problem occurs continuously. The problem has been gradually worsening. The pain is severe. The stool is described as hard. There was no prior successful therapy. There was no prior unsuccessful therapy. Associated symptoms include abdominal pain.  Abdominal Pain The primary symptoms of the illness include abdominal pain.  Additional symptoms associated with the illness include constipation.    Past Medical History  Diagnosis Date  . Superior mesenteric artery syndrome 11/08    sp dilation by Dr. Ewing Schlein, Pt may require bowel resetion if sx recur  . Duodenal ulcer 11/08    With hemorrhage and obstruction  . Osteomyelitis, jaw acute 11/08    Started while in the hospital abcess showed GNR . SP debridement by Dr. Neoma Laming,  Ernesta Amble S Bovis sp 4  weeks of Pen V started on 05/19/07  with additional 2 weeks in 07/04/07  . Degenerative disk disease     This is interscapular, mild compression frx of T12 superior endplate with Schmorl's node. Seen on CT in 2/08  . Back pain   . Depression     History reviewed. No pertinent past surgical history.  Family History  Problem Relation Age of Onset  . Cancer Mother   . Heart disease Father   . Diabetes Sister     History  Substance Use Topics  . Smoking status: Current Some Day Smoker -- 0.2 packs/day for 10 years    Types: Cigarettes  . Smokeless tobacco: Not on file  . Alcohol Use: No    OB History    Grav Para Term Preterm Abortions TAB SAB Ect Mult Living                   Review of Systems  Gastrointestinal: Positive for abdominal pain and constipation.  All other systems reviewed and are negative.    Allergies  Ibuprofen; Latex; and Nsaids  Home Medications   Current Outpatient Rx  Name Route Sig Dispense Refill  . ALBUTEROL SULFATE HFA 108 (90 BASE) MCG/ACT IN AERS Inhalation Inhale 2 puffs into the lungs every 4 (four) hours as needed for wheezing. 1 Inhaler 3  . BUPROPION HCL 100 MG PO TABS Oral Take 1 tablet (100 mg total) by mouth 3 (three) times daily. 90 tablet 3  . CALCIUM CARBONATE-VITAMIN D 500-200 MG-UNIT PO TABS Oral Take 1 tablet by mouth 3 (three) times daily.      Marland Kitchen DOCUSATE SODIUM 100 MG PO CAPS Oral Take 100 mg by mouth daily as needed. For shortness of breath    . ESOMEPRAZOLE MAGNESIUM 40 MG PO CPDR Oral Take 1 capsule (40 mg total) by mouth 2 (two) times daily. 180 capsule 3    **Patient requests 90 day supply**  . HYDROCODONE-ACETAMINOPHEN 7.5-750 MG PO TABS  TAKE 1 TABLET BY MOUTH EVERY 4 HOURS AS NEEDED FOR PAIN 150 tablet 5  . LORATADINE-PSEUDOEPHEDRINE ER 5-120 MG PO TB12 Oral Take 1 tablet by mouth 2 (two) times daily. 30 tablet 0  . ONDANSETRON HCL 4 MG PO TABS  TAKE 1 TABLET BY MOUTH  EVERY 8 HOURS AS NEEDED FOR NAUSEA 90 tablet 3  . PRAVASTATIN SODIUM 80 MG PO TABS Oral Take 1 tablet (80 mg total) by mouth daily. 30 tablet 11  . SYRINGE/NEEDLE (DISP) 21G X 1-1/2" 3 ML MISC Does not apply by Does not apply route.        BP 119/32  Pulse 107  Resp 18  SpO2 97%  LMP 08/25/1980  Physical Exam  Nursing note and vitals reviewed. Constitutional: She is oriented to person, place, and time. She appears well-developed and well-nourished.       Appears uncomfortable.  Neck: Normal range of motion. Neck supple.  Abdominal:       The patient is ttp in all four quadrants.  No rebound or guarding.  Musculoskeletal: Normal range of motion.  Neurological: She is alert and oriented to person, place, and time.  Skin: Skin is  warm and dry. She is not diaphoretic.    ED Course  Procedures (including critical care time)   Labs Reviewed  OCCULT BLOOD X 1 CARD TO LAB, STOOL  CBC WITH DIFFERENTIAL  COMPREHENSIVE METABOLIC PANEL   No results found.   No diagnosis found.   MDM  The patient presents with abd pain, nausea, and wretching with no bowel movement in the past 4 days.  The rectal exam fails to reveal any stool and is hemoccult negative.  The xrays do not show an obstruction and are more consistent with constipation.  As she continues to complain of severe pain, a ct scan of the abdomen and pelvis will be obtained to rule out obstruction or other intra-abdominal pathology.  The care will be signed out to Dr. Rosalia Hammers.        Geoffery Lyons, MD 07/30/11 [phone]

## 2011-07-29 NOTE — ED Notes (Signed)
MD at bedside. 

## 2011-07-29 NOTE — ED Notes (Signed)
Patient transported to X-ray 

## 2011-07-29 NOTE — ED Notes (Signed)
From home. Complains of abdominal pain, rectal pain and constipation x 4 days. LBM 4.5 days ago. No N/V. A/Ox4, resp e/u, ambulatory, NAD.

## 2011-07-30 ENCOUNTER — Encounter (HOSPITAL_COMMUNITY): Payer: Self-pay | Admitting: Certified Registered"

## 2011-07-30 ENCOUNTER — Emergency Department (HOSPITAL_COMMUNITY): Payer: Medicare Other

## 2011-07-30 ENCOUNTER — Encounter (HOSPITAL_COMMUNITY): Payer: Self-pay | Admitting: Surgery

## 2011-07-30 ENCOUNTER — Encounter (HOSPITAL_COMMUNITY): Admission: EM | Disposition: A | Discharge status: Skilled Nursing Facility | Payer: Self-pay | Source: Home / Self Care

## 2011-07-30 ENCOUNTER — Inpatient Hospital Stay (HOSPITAL_COMMUNITY): Payer: Medicare Other | Admitting: Certified Registered"

## 2011-07-30 DIAGNOSIS — F17200 Nicotine dependence, unspecified, uncomplicated: Secondary | ICD-10-CM | POA: Diagnosis present

## 2011-07-30 DIAGNOSIS — Z4801 Encounter for change or removal of surgical wound dressing: Secondary | ICD-10-CM | POA: Diagnosis not present

## 2011-07-30 DIAGNOSIS — K5289 Other specified noninfective gastroenteritis and colitis: Secondary | ICD-10-CM | POA: Diagnosis not present

## 2011-07-30 DIAGNOSIS — H5440 Blindness, one eye, unspecified eye: Secondary | ICD-10-CM | POA: Diagnosis not present

## 2011-07-30 DIAGNOSIS — K5900 Constipation, unspecified: Secondary | ICD-10-CM | POA: Diagnosis not present

## 2011-07-30 DIAGNOSIS — E538 Deficiency of other specified B group vitamins: Secondary | ICD-10-CM | POA: Diagnosis present

## 2011-07-30 DIAGNOSIS — M1990 Unspecified osteoarthritis, unspecified site: Secondary | ICD-10-CM | POA: Diagnosis not present

## 2011-07-30 DIAGNOSIS — K559 Vascular disorder of intestine, unspecified: Secondary | ICD-10-CM

## 2011-07-30 DIAGNOSIS — K631 Perforation of intestine (nontraumatic): Secondary | ICD-10-CM | POA: Diagnosis not present

## 2011-07-30 DIAGNOSIS — E785 Hyperlipidemia, unspecified: Secondary | ICD-10-CM | POA: Diagnosis present

## 2011-07-30 DIAGNOSIS — R5381 Other malaise: Secondary | ICD-10-CM | POA: Diagnosis present

## 2011-07-30 DIAGNOSIS — K630 Abscess of intestine: Secondary | ICD-10-CM | POA: Diagnosis not present

## 2011-07-30 DIAGNOSIS — R109 Unspecified abdominal pain: Secondary | ICD-10-CM | POA: Diagnosis not present

## 2011-07-30 DIAGNOSIS — Z833 Family history of diabetes mellitus: Secondary | ICD-10-CM | POA: Diagnosis not present

## 2011-07-30 DIAGNOSIS — Z886 Allergy status to analgesic agent status: Secondary | ICD-10-CM | POA: Diagnosis not present

## 2011-07-30 DIAGNOSIS — K5732 Diverticulitis of large intestine without perforation or abscess without bleeding: Secondary | ICD-10-CM

## 2011-07-30 DIAGNOSIS — Z8249 Family history of ischemic heart disease and other diseases of the circulatory system: Secondary | ICD-10-CM | POA: Diagnosis not present

## 2011-07-30 DIAGNOSIS — J449 Chronic obstructive pulmonary disease, unspecified: Secondary | ICD-10-CM | POA: Diagnosis not present

## 2011-07-30 DIAGNOSIS — K5720 Diverticulitis of large intestine with perforation and abscess without bleeding: Secondary | ICD-10-CM | POA: Diagnosis present

## 2011-07-30 DIAGNOSIS — F329 Major depressive disorder, single episode, unspecified: Secondary | ICD-10-CM | POA: Diagnosis present

## 2011-07-30 DIAGNOSIS — Z433 Encounter for attention to colostomy: Secondary | ICD-10-CM | POA: Diagnosis not present

## 2011-07-30 DIAGNOSIS — Z79899 Other long term (current) drug therapy: Secondary | ICD-10-CM | POA: Diagnosis not present

## 2011-07-30 DIAGNOSIS — Z5189 Encounter for other specified aftercare: Secondary | ICD-10-CM | POA: Diagnosis not present

## 2011-07-30 DIAGNOSIS — R279 Unspecified lack of coordination: Secondary | ICD-10-CM | POA: Diagnosis not present

## 2011-07-30 DIAGNOSIS — Z91040 Latex allergy status: Secondary | ICD-10-CM | POA: Diagnosis not present

## 2011-07-30 DIAGNOSIS — G894 Chronic pain syndrome: Secondary | ICD-10-CM | POA: Diagnosis present

## 2011-07-30 DIAGNOSIS — E669 Obesity, unspecified: Secondary | ICD-10-CM | POA: Diagnosis present

## 2011-07-30 DIAGNOSIS — K650 Generalized (acute) peritonitis: Secondary | ICD-10-CM | POA: Diagnosis present

## 2011-07-30 DIAGNOSIS — K660 Peritoneal adhesions (postprocedural) (postinfection): Secondary | ICD-10-CM | POA: Diagnosis present

## 2011-07-30 DIAGNOSIS — R262 Difficulty in walking, not elsewhere classified: Secondary | ICD-10-CM | POA: Diagnosis not present

## 2011-07-30 DIAGNOSIS — K56609 Unspecified intestinal obstruction, unspecified as to partial versus complete obstruction: Secondary | ICD-10-CM | POA: Diagnosis not present

## 2011-07-30 DIAGNOSIS — K668 Other specified disorders of peritoneum: Secondary | ICD-10-CM

## 2011-07-30 DIAGNOSIS — Z6829 Body mass index (BMI) 29.0-29.9, adult: Secondary | ICD-10-CM | POA: Diagnosis not present

## 2011-07-30 DIAGNOSIS — M6281 Muscle weakness (generalized): Secondary | ICD-10-CM | POA: Diagnosis not present

## 2011-07-30 DIAGNOSIS — Z87891 Personal history of nicotine dependence: Secondary | ICD-10-CM | POA: Diagnosis not present

## 2011-07-30 DIAGNOSIS — M810 Age-related osteoporosis without current pathological fracture: Secondary | ICD-10-CM | POA: Diagnosis not present

## 2011-07-30 DIAGNOSIS — R1110 Vomiting, unspecified: Secondary | ICD-10-CM | POA: Diagnosis not present

## 2011-07-30 DIAGNOSIS — K5649 Other impaction of intestine: Secondary | ICD-10-CM | POA: Diagnosis not present

## 2011-07-30 HISTORY — PX: TRANSRECTAL DRAINAGE OF PELVIC ABSCESS: SUR1387

## 2011-07-30 HISTORY — DX: Vascular disorder of intestine, unspecified: K55.9

## 2011-07-30 HISTORY — PX: LYSIS OF ADHESION: SHX5961

## 2011-07-30 HISTORY — PX: COLECTOMY: SHX59

## 2011-07-30 HISTORY — PX: COLOSTOMY: SHX63

## 2011-07-30 LAB — GLUCOSE, CAPILLARY
Glucose-Capillary: 89 mg/dL (ref 70–99)
Glucose-Capillary: 72 mg/dL (ref 70–99)
Glucose-Capillary: 89 mg/dL (ref 70–99)

## 2011-07-30 LAB — CBC
HCT: 39.8 % (ref 36.0–46.0)
Hemoglobin: 13.9 g/dL (ref 12.0–15.0)
MCH: 32.8 pg (ref 26.0–34.0)
MCHC: 34.9 g/dL (ref 30.0–36.0)
MCV: 93.9 fL (ref 78.0–100.0)
RDW: 13.2 % (ref 11.5–15.5)

## 2011-07-30 LAB — COMPREHENSIVE METABOLIC PANEL
Alkaline Phosphatase: 143 U/L — ABNORMAL HIGH (ref 39–117)
BUN: 8 mg/dL (ref 6–23)
CO2: 19 mEq/L (ref 19–32)
Chloride: 108 mEq/L (ref 96–112)
Creatinine, Ser: 0.65 mg/dL (ref 0.50–1.10)
GFR calc non Af Amer: 90 mL/min — ABNORMAL LOW (ref >90)
Glucose, Bld: 101 mg/dL — ABNORMAL HIGH (ref 70–99)
Potassium: 2.9 mEq/L — ABNORMAL LOW (ref 3.5–5.1)
Total Bilirubin: 0.4 mg/dL (ref 0.3–1.2)

## 2011-07-30 LAB — BASIC METABOLIC PANEL
BUN: 7 mg/dL (ref 6–23)
Chloride: 107 mEq/L (ref 96–112)
Creatinine, Ser: 0.55 mg/dL (ref 0.50–1.10)
GFR calc Af Amer: >90 mL/min (ref >90)
Glucose, Bld: 81 mg/dL (ref 70–99)
Potassium: 4 mEq/L (ref 3.5–5.1)

## 2011-07-30 SURGERY — LYSIS, ADHESIONS, LAPAROSCOPIC
Anesthesia: General | Site: Abdomen | Wound class: Dirty or Infected

## 2011-07-30 MED ORDER — ONDANSETRON HCL 4 MG/2ML IJ SOLN
4.0000 mg | Freq: Four times a day (QID) | INTRAMUSCULAR | Status: DC | PRN
  Administered 2011-07-30 – 2011-07-31 (×4): 4 mg via INTRAVENOUS
  Administered 2011-08-01: 8 mg via INTRAVENOUS
  Administered 2011-08-02 – 2011-08-10 (×16): 4 mg via INTRAVENOUS
  Filled 2011-07-30 (×11): qty 2
  Filled 2011-07-30: qty 4
  Filled 2011-07-30 (×9): qty 2

## 2011-07-30 MED ORDER — SODIUM CHLORIDE 0.9 % IV SOLN
INTRAVENOUS | Status: AC
  Filled 2011-07-30 (×2): qty 1000

## 2011-07-30 MED ORDER — DEXTROSE 5 % IV SOLN
2.0000 g | INTRAVENOUS | Status: DC
  Administered 2011-07-30 – 2011-08-08 (×10): 2 g via INTRAVENOUS
  Filled 2011-07-30 (×12): qty 2

## 2011-07-30 MED ORDER — METRONIDAZOLE IN NACL 5-0.79 MG/ML-% IV SOLN
500.0000 mg | Freq: Four times a day (QID) | INTRAVENOUS | Status: DC
  Administered 2011-07-30 – 2011-08-09 (×39): 500 mg via INTRAVENOUS
  Filled 2011-07-30 (×46): qty 100

## 2011-07-30 MED ORDER — ONDANSETRON HCL 4 MG/2ML IJ SOLN
INTRAMUSCULAR | Status: DC | PRN
  Administered 2011-07-30: 4 mg via INTRAVENOUS

## 2011-07-30 MED ORDER — FENTANYL CITRATE 0.05 MG/ML IJ SOLN
INTRAMUSCULAR | Status: AC
  Filled 2011-07-30: qty 2

## 2011-07-30 MED ORDER — FENTANYL CITRATE 0.05 MG/ML IJ SOLN
50.0000 ug | Freq: Once | INTRAMUSCULAR | Status: AC
  Administered 2011-07-30: 50 ug via INTRAVENOUS

## 2011-07-30 MED ORDER — LIDOCAINE HCL (CARDIAC) 20 MG/ML IV SOLN
INTRAVENOUS | Status: DC | PRN
  Administered 2011-07-30: 100 mg via INTRAVENOUS

## 2011-07-30 MED ORDER — IOHEXOL 300 MG/ML  SOLN
20.0000 mL | INTRAMUSCULAR | Status: AC
  Administered 2011-07-30 (×2): 20 mL via ORAL

## 2011-07-30 MED ORDER — HYDROMORPHONE HCL PF 1 MG/ML IJ SOLN
0.5000 mg | INTRAMUSCULAR | Status: DC | PRN
  Administered 2011-07-30: 1 mg via INTRAVENOUS
  Administered 2011-07-30 (×3): 0.5 mg via INTRAVENOUS
  Administered 2011-07-30: 1 mg via INTRAVENOUS
  Administered 2011-07-30 (×3): 0.5 mg via INTRAVENOUS
  Administered 2011-07-31 (×9): 1 mg via INTRAVENOUS
  Administered 2011-08-01: 2 mg via INTRAVENOUS
  Administered 2011-08-01 (×5): 1 mg via INTRAVENOUS
  Administered 2011-08-01: 2 mg via INTRAVENOUS
  Administered 2011-08-01 (×2): 1 mg via INTRAVENOUS
  Administered 2011-08-02 (×5): 2 mg via INTRAVENOUS
  Administered 2011-08-02: 1 mg via INTRAVENOUS
  Administered 2011-08-02: 2 mg via INTRAVENOUS
  Administered 2011-08-02 (×3): 1 mg via INTRAVENOUS
  Administered 2011-08-02 – 2011-08-03 (×5): 2 mg via INTRAVENOUS
  Administered 2011-08-03: 1 mg via INTRAVENOUS
  Administered 2011-08-03 (×2): 2 mg via INTRAVENOUS
  Administered 2011-08-03: 1 mg via INTRAVENOUS
  Administered 2011-08-03 – 2011-08-04 (×2): 2 mg via INTRAVENOUS
  Administered 2011-08-04 (×2): 1 mg via INTRAVENOUS
  Administered 2011-08-04 (×4): 2 mg via INTRAVENOUS
  Administered 2011-08-05 – 2011-08-06 (×2): 1 mg via INTRAVENOUS
  Administered 2011-08-06: 2 mg via INTRAVENOUS
  Administered 2011-08-06: 1 mg via INTRAVENOUS
  Administered 2011-08-06: 2 mg via INTRAVENOUS
  Administered 2011-08-07 – 2011-08-09 (×12): 1 mg via INTRAVENOUS
  Administered 2011-08-09: 0.5 mg via INTRAVENOUS
  Administered 2011-08-09: 1 mg via INTRAVENOUS
  Administered 2011-08-09: 0.5 mg via INTRAVENOUS
  Administered 2011-08-10 – 2011-08-11 (×6): 1 mg via INTRAVENOUS
  Filled 2011-07-30 (×2): qty 1
  Filled 2011-07-30: qty 2
  Filled 2011-07-30 (×2): qty 1
  Filled 2011-07-30: qty 2
  Filled 2011-07-30: qty 1
  Filled 2011-07-30: qty 2
  Filled 2011-07-30: qty 1
  Filled 2011-07-30: qty 2
  Filled 2011-07-30 (×2): qty 1
  Filled 2011-07-30 (×2): qty 2
  Filled 2011-07-30 (×4): qty 1
  Filled 2011-07-30: qty 2
  Filled 2011-07-30 (×2): qty 1
  Filled 2011-07-30: qty 2
  Filled 2011-07-30: qty 1
  Filled 2011-07-30: qty 2
  Filled 2011-07-30: qty 1
  Filled 2011-07-30: qty 2
  Filled 2011-07-30 (×4): qty 1
  Filled 2011-07-30: qty 2
  Filled 2011-07-30 (×2): qty 1
  Filled 2011-07-30 (×2): qty 2
  Filled 2011-07-30: qty 1
  Filled 2011-07-30 (×2): qty 2
  Filled 2011-07-30 (×14): qty 1
  Filled 2011-07-30: qty 2
  Filled 2011-07-30 (×2): qty 1
  Filled 2011-07-30: qty 2
  Filled 2011-07-30 (×2): qty 1
  Filled 2011-07-30: qty 2
  Filled 2011-07-30 (×4): qty 1
  Filled 2011-07-30: qty 2
  Filled 2011-07-30 (×5): qty 1
  Filled 2011-07-30: qty 2
  Filled 2011-07-30 (×2): qty 1
  Filled 2011-07-30: qty 2
  Filled 2011-07-30: qty 1
  Filled 2011-07-30: qty 2
  Filled 2011-07-30 (×4): qty 1

## 2011-07-30 MED ORDER — BLISTEX EX OINT
TOPICAL_OINTMENT | Freq: Two times a day (BID) | CUTANEOUS | Status: DC
  Administered 2011-07-30: 12:00:00 via TOPICAL
  Administered 2011-07-30: 1 via TOPICAL
  Administered 2011-07-31 – 2011-08-03 (×7): via TOPICAL
  Administered 2011-08-03: 1 via TOPICAL
  Administered 2011-08-04 (×2): via TOPICAL
  Administered 2011-08-05 (×2): 1 via TOPICAL
  Administered 2011-08-06 – 2011-08-10 (×9): via TOPICAL
  Filled 2011-07-30: qty 10

## 2011-07-30 MED ORDER — LACTATED RINGERS IV SOLN
INTRAVENOUS | Status: DC
  Administered 2011-07-30 (×2): via INTRAVENOUS

## 2011-07-30 MED ORDER — ALVIMOPAN 12 MG PO CAPS
12.0000 mg | ORAL_CAPSULE | Freq: Once | ORAL | Status: DC
  Filled 2011-07-30: qty 1

## 2011-07-30 MED ORDER — PROPOFOL 10 MG/ML IV EMUL
INTRAVENOUS | Status: DC | PRN
  Administered 2011-07-30: 100 mg via INTRAVENOUS

## 2011-07-30 MED ORDER — MENTHOL 3 MG MT LOZG
1.0000 | LOZENGE | OROMUCOSAL | Status: DC | PRN
Start: 2011-07-30 — End: 2011-08-11
  Filled 2011-07-30: qty 9

## 2011-07-30 MED ORDER — POVIDONE-IODINE 10 % EX SOLN
CUTANEOUS | Status: DC | PRN
  Administered 2011-07-30: 1 via TOPICAL

## 2011-07-30 MED ORDER — MIDAZOLAM HCL 5 MG/5ML IJ SOLN
INTRAMUSCULAR | Status: DC | PRN
  Administered 2011-07-30: 2 mg via INTRAVENOUS

## 2011-07-30 MED ORDER — ALBUTEROL SULFATE HFA 108 (90 BASE) MCG/ACT IN AERS: 2.0000 | INHALATION_SPRAY | RESPIRATORY_TRACT | Status: DC | PRN

## 2011-07-30 MED ORDER — LACTATED RINGERS IV BOLUS (SEPSIS)
1000.0000 mL | Freq: Once | INTRAVENOUS | Status: AC
  Administered 2011-07-30: 1000 mL via INTRAVENOUS

## 2011-07-30 MED ORDER — DROPERIDOL 2.5 MG/ML IJ SOLN: 0.6250 mg | INTRAMUSCULAR | Status: DC | PRN

## 2011-07-30 MED ORDER — MAGIC MOUTHWASH
15.0000 mL | Freq: Four times a day (QID) | ORAL | Status: DC | PRN
  Filled 2011-07-30: qty 15

## 2011-07-30 MED ORDER — ACETAMINOPHEN 650 MG RE SUPP: 650.0000 mg | Freq: Four times a day (QID) | RECTAL | Status: DC | PRN

## 2011-07-30 MED ORDER — SODIUM CHLORIDE 0.9 % IV SOLN
Freq: Once | INTRAVENOUS | Status: AC
  Administered 2011-07-30: 17:00:00 via INTRAVENOUS

## 2011-07-30 MED ORDER — ACETAMINOPHEN 325 MG PO TABS
325.0000 mg | ORAL_TABLET | Freq: Four times a day (QID) | ORAL | Status: DC | PRN
  Administered 2011-07-30 – 2011-08-05 (×2): 650 mg via ORAL
  Filled 2011-07-30 (×2): qty 2

## 2011-07-30 MED ORDER — METOPROLOL TARTRATE 1 MG/ML IV SOLN
5.0000 mg | Freq: Four times a day (QID) | INTRAVENOUS | Status: DC | PRN
  Filled 2011-07-30: qty 5

## 2011-07-30 MED ORDER — SODIUM CHLORIDE 0.9 % IV SOLN
3.0000 g | Freq: Once | INTRAVENOUS | Status: AC
  Administered 2011-07-30: 3 g via INTRAVENOUS
  Filled 2011-07-30: qty 3

## 2011-07-30 MED ORDER — HYDROMORPHONE HCL PF 1 MG/ML IJ SOLN
0.2500 mg | INTRAMUSCULAR | Status: DC | PRN
  Administered 2011-07-30: 0.5 mg via INTRAVENOUS
  Administered 2011-07-30 (×2): 0.25 mg via INTRAVENOUS

## 2011-07-30 MED ORDER — ONDANSETRON HCL 4 MG/2ML IJ SOLN
INTRAMUSCULAR | Status: AC
  Administered 2011-07-30: 4 mg via INTRAVENOUS
  Filled 2011-07-30: qty 2

## 2011-07-30 MED ORDER — DIPHENHYDRAMINE HCL 12.5 MG/5ML PO ELIX
12.5000 mg | ORAL_SOLUTION | Freq: Four times a day (QID) | ORAL | Status: DC | PRN
  Filled 2011-07-30: qty 10

## 2011-07-30 MED ORDER — ROCURONIUM BROMIDE 100 MG/10ML IV SOLN
INTRAVENOUS | Status: DC | PRN
  Administered 2011-07-30: 10 mg via INTRAVENOUS
  Administered 2011-07-30: 20 mg via INTRAVENOUS
  Administered 2011-07-30: 10 mg via INTRAVENOUS

## 2011-07-30 MED ORDER — CHLORHEXIDINE GLUCONATE 4 % EX LIQD
1.0000 "application " | Freq: Once | CUTANEOUS | Status: DC
  Filled 2011-07-30: qty 15

## 2011-07-30 MED ORDER — ONDANSETRON HCL 4 MG/2ML IJ SOLN
4.0000 mg | Freq: Once | INTRAMUSCULAR | Status: AC
  Administered 2011-07-30: 4 mg via INTRAVENOUS
  Filled 2011-07-30: qty 2

## 2011-07-30 MED ORDER — SODIUM CHLORIDE 0.9 % IR SOLN
Status: DC | PRN
  Administered 2011-07-30: 1000 mL
  Administered 2011-07-30: 3000 mL

## 2011-07-30 MED ORDER — LIP MEDEX EX OINT: 1.0000 "application " | TOPICAL_OINTMENT | Freq: Two times a day (BID) | CUTANEOUS | Status: DC

## 2011-07-30 MED ORDER — DIPHENHYDRAMINE HCL 50 MG/ML IJ SOLN
12.5000 mg | Freq: Four times a day (QID) | INTRAMUSCULAR | Status: DC | PRN
  Administered 2011-08-03: 12.5 mg via INTRAVENOUS
  Filled 2011-07-30: qty 1

## 2011-07-30 MED ORDER — SODIUM CHLORIDE 0.9 % IR SOLN
Status: DC | PRN
  Administered 2011-07-30: 1000 mL

## 2011-07-30 MED ORDER — DEXAMETHASONE SODIUM PHOSPHATE 4 MG/ML IJ SOLN
INTRAMUSCULAR | Status: DC | PRN
  Administered 2011-07-30: 4 mg via INTRAVENOUS

## 2011-07-30 MED ORDER — SUCCINYLCHOLINE CHLORIDE 20 MG/ML IJ SOLN
INTRAMUSCULAR | Status: DC | PRN
  Administered 2011-07-30: 110 mg via INTRAVENOUS

## 2011-07-30 MED ORDER — CHLORHEXIDINE GLUCONATE 0.12 % MT SOLN
15.0000 mL | Freq: Two times a day (BID) | OROMUCOSAL | Status: DC
  Administered 2011-07-30 – 2011-07-31 (×2): 15 mL via OROMUCOSAL
  Filled 2011-07-30 (×2): qty 15

## 2011-07-30 MED ORDER — DROPERIDOL 2.5 MG/ML IJ SOLN
INTRAMUSCULAR | Status: DC | PRN
  Administered 2011-07-30: 0.625 mg via INTRAVENOUS

## 2011-07-30 MED ORDER — PROMETHAZINE HCL 25 MG/ML IJ SOLN
12.5000 mg | Freq: Four times a day (QID) | INTRAMUSCULAR | Status: DC | PRN
  Administered 2011-08-06 – 2011-08-09 (×4): 12.5 mg via INTRAVENOUS
  Administered 2011-08-09: 6.25 mg via INTRAVENOUS
  Administered 2011-08-11 (×2): 12.5 mg via INTRAVENOUS
  Filled 2011-07-30 (×7): qty 1

## 2011-07-30 MED ORDER — HYDROMORPHONE HCL PF 1 MG/ML IJ SOLN
INTRAMUSCULAR | Status: AC
  Administered 2011-07-30: 0.5 mg via INTRAVENOUS
  Filled 2011-07-30: qty 1

## 2011-07-30 MED ORDER — HYDROCODONE-ACETAMINOPHEN 5-325 MG PO TABS: 1.0000 | ORAL_TABLET | ORAL | Status: DC | PRN

## 2011-07-30 MED ORDER — CHLORHEXIDINE GLUCONATE 4 % EX LIQD: 1.0000 "application " | Freq: Once | CUTANEOUS | Status: DC

## 2011-07-30 MED ORDER — POTASSIUM CHLORIDE 10 MEQ/100ML IV SOLN
10.0000 meq | Freq: Once | INTRAVENOUS | Status: AC
  Administered 2011-07-30: 10 meq via INTRAVENOUS
  Filled 2011-07-30: qty 100

## 2011-07-30 MED ORDER — SODIUM CHLORIDE 0.9 % IV BOLUS (SEPSIS)
1000.0000 mL | Freq: Once | INTRAVENOUS | Status: AC
  Administered 2011-07-30: 1000 mL via INTRAVENOUS

## 2011-07-30 MED ORDER — ACETAMINOPHEN 325 MG PO TABS: 650.0000 mg | ORAL_TABLET | Freq: Four times a day (QID) | ORAL | Status: DC | PRN

## 2011-07-30 MED ORDER — ONDANSETRON HCL 4 MG/2ML IJ SOLN: 4.0000 mg | Freq: Four times a day (QID) | INTRAMUSCULAR | Status: DC | PRN

## 2011-07-30 MED ORDER — FENTANYL CITRATE 0.05 MG/ML IJ SOLN
INTRAMUSCULAR | Status: DC | PRN
  Administered 2011-07-30 (×2): 50 ug via INTRAVENOUS

## 2011-07-30 MED ORDER — LACTATED RINGERS IV BOLUS (SEPSIS): 1000.0000 mL | Freq: Three times a day (TID) | INTRAVENOUS | Status: DC | PRN

## 2011-07-30 MED ORDER — DIPHENHYDRAMINE HCL 50 MG/ML IJ SOLN: 12.5000 mg | Freq: Four times a day (QID) | INTRAMUSCULAR | Status: DC | PRN

## 2011-07-30 MED ORDER — LACTATED RINGERS IV SOLN
INTRAVENOUS | Status: DC | PRN
  Administered 2011-07-30 (×3): via INTRAVENOUS

## 2011-07-30 MED ORDER — BIOTENE DRY MOUTH MT LIQD: 15.0000 mL | Freq: Two times a day (BID) | OROMUCOSAL | Status: DC

## 2011-07-30 MED ORDER — PANTOPRAZOLE SODIUM 40 MG IV SOLR
40.0000 mg | Freq: Two times a day (BID) | INTRAVENOUS | Status: DC
  Administered 2011-07-30 – 2011-08-08 (×19): 40 mg via INTRAVENOUS
  Filled 2011-07-30 (×26): qty 40

## 2011-07-30 MED ORDER — METRONIDAZOLE IN NACL 5-0.79 MG/ML-% IV SOLN
500.0000 mg | Freq: Once | INTRAVENOUS | Status: AC
  Administered 2011-07-30: 500 mg via INTRAVENOUS
  Filled 2011-07-30: qty 100

## 2011-07-30 MED ORDER — ALBUMIN HUMAN 5 % IV SOLN
INTRAVENOUS | Status: DC | PRN
  Administered 2011-07-30: 06:00:00 via INTRAVENOUS

## 2011-07-30 MED ORDER — IOHEXOL 300 MG/ML  SOLN
100.0000 mL | Freq: Once | INTRAMUSCULAR | Status: AC | PRN
  Administered 2011-07-30: 100 mL via INTRAVENOUS

## 2011-07-30 MED ORDER — HEPARIN SODIUM (PORCINE) 5000 UNIT/ML IJ SOLN
5000.0000 [IU] | Freq: Three times a day (TID) | INTRAMUSCULAR | Status: DC
  Administered 2011-07-31 – 2011-08-11 (×34): 5000 [IU] via SUBCUTANEOUS
  Filled 2011-07-30 (×40): qty 1

## 2011-07-30 MED ORDER — SODIUM CHLORIDE 0.9 % IV SOLN
Freq: Once | INTRAVENOUS | Status: AC
  Administered 2011-07-30: 01:00:00 via INTRAVENOUS

## 2011-07-30 MED ORDER — SUFENTANIL CITRATE 50 MCG/ML IV SOLN
INTRAVENOUS | Status: DC | PRN
  Administered 2011-07-30: 10 ug via INTRAVENOUS
  Administered 2011-07-30: 20 ug via INTRAVENOUS
  Administered 2011-07-30 (×2): 10 ug via INTRAVENOUS

## 2011-07-30 SURGICAL SUPPLY — 86 items
APPLICATOR COTTON TIP 6IN STRL (MISCELLANEOUS) IMPLANT
APPLIER CLIP 5 13 M/L LIGAMAX5 (MISCELLANEOUS)
APPLIER CLIP ROT 10 11.4 M/L (STAPLE)
BLADE SURG 10 STRL SS (BLADE) ×4 IMPLANT
CANISTER SUCTION 2500CC (MISCELLANEOUS) ×8 IMPLANT
CELLS DAT CNTRL 66122 CELL SVR (MISCELLANEOUS) IMPLANT
CHLORAPREP W/TINT 26ML (MISCELLANEOUS) ×4 IMPLANT
CLIP APPLIE 5 13 M/L LIGAMAX5 (MISCELLANEOUS) IMPLANT
CLIP APPLIE ROT 10 11.4 M/L (STAPLE) IMPLANT
CLOTH BEACON ORANGE TIMEOUT ST (SAFETY) ×4 IMPLANT
COVER SURGICAL LIGHT HANDLE (MISCELLANEOUS) ×4 IMPLANT
DECANTER SPIKE VIAL GLASS SM (MISCELLANEOUS) ×4 IMPLANT
DRAIN CHANNEL 19F RND (DRAIN) ×4 IMPLANT
DRAPE INCISE IOBAN 66X45 STRL (DRAPES) IMPLANT
DRAPE PROXIMA HALF (DRAPES) IMPLANT
DRAPE WARM FLUID 44X44 (DRAPE) ×4 IMPLANT
DRSG TEGADERM 4X4.75 (GAUZE/BANDAGES/DRESSINGS) ×12 IMPLANT
ELECT CAUTERY BLADE 6.4 (BLADE) ×4 IMPLANT
ELECT REM PT RETURN 9FT ADLT (ELECTROSURGICAL) ×4
ELECTRODE REM PT RTRN 9FT ADLT (ELECTROSURGICAL) ×3 IMPLANT
EVACUATOR SILICONE 100CC (DRAIN) ×4 IMPLANT
GAUZE SPONGE 2X2 8PLY STRL LF (GAUZE/BANDAGES/DRESSINGS) ×3 IMPLANT
GEL ULTRASOUND 20GR AQUASONIC (MISCELLANEOUS) IMPLANT
GELPOINT ADV PLATFORM (ENDOMECHANICALS)
GLOVE BIOGEL PI IND STRL 8 (GLOVE) ×3 IMPLANT
GLOVE BIOGEL PI INDICATOR 8 (GLOVE) ×1
GLOVE ECLIPSE 8.0 STRL XLNG CF (GLOVE) ×8 IMPLANT
GOWN PREVENTION PLUS XLARGE (GOWN DISPOSABLE) ×4 IMPLANT
GOWN STRL NON-REIN LRG LVL3 (GOWN DISPOSABLE) ×4 IMPLANT
KIT BASIN OR (CUSTOM PROCEDURE TRAY) ×4 IMPLANT
KIT ROOM TURNOVER OR (KITS) ×4 IMPLANT
LEGGING LITHOTOMY PAIR STRL (DRAPES) IMPLANT
LIGASURE 5MM LAPAROSCOPIC (INSTRUMENTS) IMPLANT
NEEDLE 22X1 1/2 (OR ONLY) (NEEDLE) IMPLANT
NS IRRIG 1000ML POUR BTL (IV SOLUTION) ×4 IMPLANT
PAD ARMBOARD 7.5X6 YLW CONV (MISCELLANEOUS) ×8 IMPLANT
PENCIL BUTTON HOLSTER BLD 10FT (ELECTRODE) ×4 IMPLANT
PLATFORM STD W/COL CELL SVR (ENDOMECHANICALS) IMPLANT
RTRCTR WOUND ALEXIS 18CM MED (MISCELLANEOUS)
SCALPEL HARMONIC ACE (MISCELLANEOUS) IMPLANT
SCISSORS LAP 5X35 DISP (ENDOMECHANICALS) IMPLANT
SEALER TISSUE G2 CVD JAW 35 (ENDOMECHANICALS) IMPLANT
SEALER TISSUE G2 CVD JAW 45CM (ENDOMECHANICALS)
SET IRRIG TUBING LAPAROSCOPIC (IRRIGATION / IRRIGATOR) ×4 IMPLANT
SLEEVE Z-THREAD 5X100MM (TROCAR) ×4 IMPLANT
SPECIMEN JAR LARGE (MISCELLANEOUS) IMPLANT
SPONGE GAUZE 2X2 STER 10/PKG (GAUZE/BANDAGES/DRESSINGS) ×1
SPONGE GAUZE 4X4 12PLY (GAUZE/BANDAGES/DRESSINGS) ×4 IMPLANT
STAPLER CUT CVD 40MM BLUE (STAPLE) ×4 IMPLANT
STAPLER CUT RELOAD BLUE (STAPLE) ×4 IMPLANT
STAPLER VISISTAT 35W (STAPLE) ×4 IMPLANT
SUCTION POOLE TIP (SUCTIONS) ×4 IMPLANT
SURGILUBE 2OZ TUBE FLIPTOP (MISCELLANEOUS) ×4 IMPLANT
SUT ETHILON 2 0 FS 18 (SUTURE) ×4 IMPLANT
SUT MNCRL AB 4-0 PS2 18 (SUTURE) ×4 IMPLANT
SUT PDS AB 1 TP1 96 (SUTURE) ×8 IMPLANT
SUT PROLENE 2 0 CT2 30 (SUTURE) IMPLANT
SUT PROLENE 2 0 KS (SUTURE) IMPLANT
SUT SILK 2 0 (SUTURE)
SUT SILK 2 0 SH (SUTURE) ×4 IMPLANT
SUT SILK 2 0 SH CR/8 (SUTURE) IMPLANT
SUT SILK 2 0 TIES 10X30 (SUTURE) ×4 IMPLANT
SUT SILK 2-0 18XBRD TIE 12 (SUTURE) IMPLANT
SUT SILK 3 0 (SUTURE)
SUT SILK 3 0 SH CR/8 (SUTURE) ×4 IMPLANT
SUT SILK 3-0 18XBRD TIE 12 (SUTURE) IMPLANT
SUT VIC AB 3-0 SH 18 (SUTURE) ×8 IMPLANT
SUT VICRYL 0 TIES 12 18 (SUTURE) IMPLANT
SUT VICRYL 0 UR6 27IN ABS (SUTURE) ×4 IMPLANT
SYS LAPSCP GELPORT 120MM (MISCELLANEOUS)
SYSTEM LAPSCP GELPORT 120MM (MISCELLANEOUS) IMPLANT
TAPE UMBILICAL 1/8 X36 TWILL (MISCELLANEOUS) ×4 IMPLANT
TOWEL OR 17X24 6PK STRL BLUE (TOWEL DISPOSABLE) ×4 IMPLANT
TOWEL OR 17X26 10 PK STRL BLUE (TOWEL DISPOSABLE) ×4 IMPLANT
TRAY FOLEY CATH 14FRSI W/METER (CATHETERS) IMPLANT
TRAY LAPAROSCOPIC (CUSTOM PROCEDURE TRAY) ×4 IMPLANT
TRAY PROCTOSCOPIC FIBER OPTIC (SET/KITS/TRAYS/PACK) IMPLANT
TROCAR FALLER TUNNELING (TROCAR) IMPLANT
TROCAR KII 12MM C0R29 THR SEP (TROCAR) IMPLANT
TROCAR XCEL BLADELESS 5X75MML (TROCAR) ×4 IMPLANT
TROCAR XCEL NON-BLD 5MMX100MML (ENDOMECHANICALS) ×4 IMPLANT
TROCAR Z-THREAD FIOS 5X100MM (TROCAR) IMPLANT
TUBE CONNECTING 12X1/4 (SUCTIONS) ×4 IMPLANT
TUBING FILTER THERMOFLATOR (ELECTROSURGICAL) ×4 IMPLANT
WATER STERILE IRR 1000ML POUR (IV SOLUTION) IMPLANT
YANKAUER SUCT BULB TIP NO VENT (SUCTIONS) ×4 IMPLANT

## 2011-07-30 NOTE — Brief Op Note (Signed)
07/29/2011 - 07/30/2011  8:31 AM  PATIENT:  [name] Bell  66 y.o. female  Patient Care Team: Linward Headland, MD as PCP - General (Internal Medicine) Petra Kuba, MD as Consulting Physician (Gastroenterology)    PRE-OPERATIVE DIAGNOSIS:  Free air, pneumoperitoneum  POST-OPERATIVE DIAGNOSIS:  Perforated Sigmoid Colon  PROCEDURE:  Procedure(s) (LRB): LAPAROSCOPIC LYSIS OF ADHESIONS (N/A) INCISION AND DRAINAGE Pelvic ABSCESS (N/A) LAPAROSCOPIC SIGMOID COLECTOMY (N/A) COLOSTOMY (Left)  SURGEON:  Surgeon(s) and Role:    * Ardeth Sportsman, MD - Primary  PHYSICIAN ASSISTANT:   ASSISTANTS: none   ANESTHESIA:   general  EBL:  Total I/O In: 1350 [I.V.:1350] Out: 255 [Urine:55; Blood:200]  BLOOD ADMINISTERED:none  DRAINS: (19) Blake drain(s) in the pelvis   LOCAL MEDICATIONS USED:  NONE  SPECIMEN:  Source of Specimen:  Rectosigmoid colon (perforation side distal)  DISPOSITION OF SPECIMEN:  PATHOLOGY  COUNTS:  YES  TOURNIQUET:  * No tourniquets in log *  DICTATION: .Other Dictation: Dictation Number [id number] PLAN OF CARE: Admit to inpatient   PATIENT DISPOSITION:  PACU - guarded condition.   Delay start of Pharmacological VTE agent (>24hrs) due to surgical blood loss or risk of bleeding: no

## 2011-07-30 NOTE — Anesthesia Preprocedure Evaluation (Addendum)
Anesthesia Evaluation  Patient identified by MRN, date of birth, ID band Patient awake    Reviewed: Allergy & Precautions, H&P , NPO status   Airway Mallampati: I TM Distance: >3 FB     Dental  (+) Edentulous Upper, Edentulous Lower and Dental Advisory Given   Pulmonary COPD COPD inhaler, Current Smoker,  breath sounds clear to auscultation        Cardiovascular Exercise Tolerance: Good negative cardio ROS  Rhythm:Regular Rate:Tachycardia     Neuro/Psych Anxiety Depression negative neurological ROS     GI/Hepatic Neg liver ROS, PUD, GERD-  Medicated and Controlled,  Endo/Other  negative endocrine ROS  Renal/GU negative Renal ROS  negative genitourinary   Musculoskeletal negative musculoskeletal ROS (+)   Abdominal   Peds  Hematology negative hematology ROS (+)   Anesthesia Other Findings   Reproductive/Obstetrics negative OB ROS                          Anesthesia Physical Anesthesia Plan  ASA: III and Emergent  Anesthesia Plan: General   Post-op Pain Management:    Induction: Intravenous, Rapid sequence and Cricoid pressure planned  Airway Management Planned: Oral ETT  Additional Equipment:   Intra-op Plan:   Post-operative Plan: Possible Post-op intubation/ventilation  Informed Consent: I have reviewed the patients History and Physical, chart, labs and discussed the procedure including the risks, benefits and alternatives for the proposed anesthesia with the patient or authorized representative who has indicated his/her understanding and acceptance.   Dental advisory given  Plan Discussed with: CRNA, Anesthesiologist and Surgeon  Anesthesia Plan Comments:        Anesthesia Quick Evaluation

## 2011-07-30 NOTE — Anesthesia Postprocedure Evaluation (Signed)
  Anesthesia Post-op Note  Patient: [name] Bell  Procedure(s) Performed: Procedure(s) (LRB): LAPAROSCOPIC LYSIS OF ADHESIONS (N/A) INCISION AND DRAINAGE ABSCESS (N/A) LAPAROSCOPIC SIGMOID COLECTOMY (N/A) COLOSTOMY (Left)  Patient Location: PACU  Anesthesia Type: General  Level of Consciousness: awake and sedated  Airway and Oxygen Therapy: Patient Spontanous Breathing and Patient connected to nasal cannula oxygen  Post-op Pain: mild  Post-op Assessment: Post-op Vital signs reviewed  Post-op Vital Signs: stable  Complications: No apparent anesthesia complications

## 2011-07-30 NOTE — Plan of Care (Signed)
Problem: Phase I Progression Outcomes Goal: Voiding-avoid urinary catheter unless indicated Outcome: Not Applicable Date Met:  07/30/11 Foley in place postop day.

## 2011-07-30 NOTE — Op Note (Signed)
NAMEMIKO, MARKWOOD NO.:  [id number]  MEDICAL RECORD NO.:  [id number]  LOCATION:  [facility]                         FACILITY:  [facility]  PHYSICIAN:  Ardeth Sportsman, MD     DATE OF BIRTH:  10-Jun-1945  DATE OF PROCEDURE:  07/30/2011 DATE OF DISCHARGE:                              OPERATIVE REPORT   PRIMARY CARE PHYSICIAN:  Janalyn Harder, MD  GASTROENTEROLOGIST:  Petra Kuba, MD, with [facility].  SURGEON:  Ardeth Sportsman, MD  PREOPERATIVE DIAGNOSES: 1. Pneumoperitoneum with peritonitis. 2. History of ischemic colitis, possible diverticular or stercoral     perforation. 3. History of duodenitis, duodenal ulcer and stricture, status post     balloon dilatation, possible ulcer perforation.  POSTOPERATIVE DIAGNOSIS: 1. Pneumoperitoneum with peritonitis. 2. Distal sigmoid colonic perforation.  Favor stercoral etiology.  PROCEDURE PERFORMED: 1. Laparoscopic lysis of adhesions x30 minutes. 2. Laparoscopic drainage of pelvic abscesses. 3. Laparoscopically assisted rectosigmoid resection with end     colostomy.  ANESTHESIA:  General anesthesia.  SPECIMENS:  Rectosigmoid colon.  The proximal end is uninflamed.  The perforation is closer to the distal end.  DRAINS:  A 19-French Blake drain goes from the right upper quadrant towards the left pericolic gutter and ends down in the pelvis.  ESTIMATED BLOOD LOSS:  200 mL.  COMPLICATIONS:  None major.  INDICATIONS:  Ms. Olveda is a 66 year old female with numerous chronic medical problems including chronic pain, on narcotics.  She also has a history of chronic constipation.  She has had episode of ischemic colitis about 4 years ago, improved with antibiotics, and some noted sigmoid tortuosity/stricturing that has been stable.  She also has a history of duodenal ulcer, most likely secondary to nonsteroidals with inflammation and stricturing, improved after balloon dilatation.  Both followed by Dr. Ewing Schlein in  the distant past.  No major events recently.  However about 4 days ago, she began to have abdominal pain and discomfort, primarily right upper quadrant and suprapubic area.  It gradually became more severe and intense.  She came in the middle of the night.  Initial plain films are negative, but then she developed some tachycardia and hypotension = early shock.  CT scan showed evidence of free air in the pelvis as well as free fluid tracking up to the right upper quadrant. Because of this, the significant peritonitis in 3 out of the 4 quadrants and her leukopenia, tachycardia, and early septic shock; I recommended surgical exploration.  Laparoscopic, possible open techniques were discussed. Possibility of colostomy was discussed.  Risks, benefits, alternatives were discussed.  Questions answered.  She agreed to proceed.  There was no other family to discuss this.  OPERATIVE FINDINGS:  She had severe peritonitis involving about 70% of her abdomen, especially in the anterior bowel loops and the lower half of the abdomen and right upper side as well.  There was no evidence of any duodenal perforation.  She had moderate omental and small bowel adhesions to the anterior abdominal wall, and interloop small bowel adhesions.  The distal sigmoid colon and rectosigmoid region was very tortuous and folded on itself in accordion style.  She had an obvious perforation  about 1 cm in size with a 4 x 4 cm area of thinned out colon.  This seemed a little more consistent with stercoral-type ulcer given very hard stool and lack of diverticulosis noted on prior endoscopy or on CT scan.  She had a pelvic infected seroma/abscess.  She now has an end colostomy in the left lower quadrant.  DESCRIPTION OF PROCEDURE:  Informed consent was confirmed.  The patient underwent general anesthesia without difficulty.  She was positioned supine with arms tucked.  She already had a Foley catheter placed in the ER.   She had a nasogastric tube placed.  She had received IV Unasyn in the emergency room.  She received Flagyl intraoperatively.  Her abdomen and mons pubis were prepped and draped in a sterile fashion.  Surgical time-out confirmed our plan.  I was in constant d/w anesthesia & they felt her shock was stabilized perioperatively, so I continued laparoscopic exploration.   I placed a 5-mm port in the right upper quadrant using optical entry technique with the patient in steep reverse Trendelenburg and right side up.  Entry was clean.  I saw moderate adhesions to the anterior abdominal wall.  I placed a 5-mm port in the right mid abdomen.  I used blunt and focused cold scissors sharp dissection to free greater omentum and some small bowel off the anterior abdominal wall.  I created a working space to place a 5-mm port in the right lower quadrant.  I completed further dissection until the omentum was freed off.  I then freed the greater omentum off the small bowel to help get that out of the way.  I then freed the small bowel out of the pelvis.  She had dense inflammatory interloop adhesions, but less old adhesions.    Eventually, I was able to break up and free off the interloop inflammatory adhesions and break those down and reduce the small bowel in the pelvis.  When I did that, I released a pocket of brown tinged murky fluid consistent with a pelvic abscess.  In freeing the small bowel off the sigmoid colon, I encountered an area in the sigmoid colon that was patch of about 5 x 5 cm of white change.  It was very thinned out.  She had 1 cm perforation there.  It had very hard stool nearby.  She had about 10 cm of very thickened inflamed colon and mesentery.  More proximally, the inflammation began to taper off towards the descending colon, and more distally began to taper off in the rectum as well.  I placed a gelport through Pfannenstiel incision.  I mobilized the colon in a lateral and  medial fashion from the splenic flexure down to the peritoneal flexion on the left side.  I could identify the left ureter and kept that posterior at all times.  That helped mobilize the left colon into the midline.  I found a window at the rectosigmoid junction, more in the proximal  rectum away from the inflammation and created a window in the mesorectum there.    I continued the surgery through the wound in an open fashion.  I placed a contour stapler through the wound of the gel port and stapled that off.  I transected the rectosigmoid colon off the mesentery proximally using the bipolar system.  I also used 2-0 silk clamp and ties for the superior rectal vessels.  I made sure to see and preserve the left ureter and keep the right ureter out of  the way.  Eventually with that, I could eviscerate the inflamed sigmoid colon and located soft descending colon.  I chose a window at the descending/sigmoid colon junction and transected that using another fire of the contour stapler. I took the mesenteric radially and removed the specimen.  I did copious irrigation and ensured hemostasis.  There was some bleeding on the descending colon mesentery that I controlled with a 2-0 silk figure-of-8 stitch to good result.  That was the source of most of the bleeding.  I did copious irrigation of saline over 4 L.  I also did a final irrigation with clindamycin/gentamicin (900 mg/240 mg) and a liter of crystalloid for finding irrigation and allowed to sit for just about 10 minutes before aspirating up.  Hemostasis was excellent.  I returned to diagnostic laparoscopy.  I ensured hemostasis.  I mobilized the splenic flexure of the colon laparoscopically to help get a better reach down to keep tension off any come down.  The descending colon mesentery was a little foreshortened, so I took the mesentery a little more proximally close to the aorta, but away from the nerves and the left ureter.  That helped  mobilize the descending colon better.  I chose a spot in the left infraumbilical paramedian region within her upper part of her mild panniculus that was flat and did not have obvious folds.  I made a 3.5 cm circular skin excision.  I split the anterior rectus fascia transversely and the rectus muscle vertically, and dilated up to 3 fingers.  I was able to bring up the mesentery, was somewhat thickened and she had a very thick abdominal wall.  At that point, I was able to get the colon to reach up and eviscerated out about 6 cm.  I ensured hemostasis.  I closed the Pfannenstiel wound using 0 Vicryl running stitch after placing the drain.  It also brought the rectus muscle to the midline and she had a mild diastasis there.  I irrigated with some antibiotic solution with that said.  I then closed the anterior rectus fascia transversally using #1 running PDS.  I then irrigated the subcutaneous tissues and closed the skin using 4-0 Monocryl interrupted stitches every 2 cm for nice point opening.  I then placed Betadine-soaked wicks in the opening to allow the wound to drain. I closed the other port sites using 4-0 Monocryl stitch and the drain site was then secured using 2-0 silk stitch.  Next, I matured the colostomy using 3-0 Vicryl interrupted stitches to get it fashioned up about 2 cm.  Superiorly, I wanted to contract down a little bit and there was some dimpling and convexity given her obesity and thick abdominal wall.  I was able to get appliance on pretty good though.  Given long case and the patient's fluid shift, we are going to keep her on the T-tube in the recovery room and gradually wake her up and extubate her.  We will watch her in the intensive care unit today to watch out for fluid losses and shifts, etc.  I could not locate family to discuss findings with them.     Ardeth Sportsman, MD     SCG/MEDQ  D:  07/30/2011  T:  07/30/2011  Job:  [id number]  cc:   Janalyn Harder,  MD Petra Kuba, M.D.

## 2011-07-30 NOTE — ED Notes (Signed)
CT notified that patient has finished drinking oral contrast.

## 2011-07-30 NOTE — ED Provider Notes (Signed)
1210 Patient received from Dr. Judd Lien with report that patient complained of constipation but has vomiting, abdominal tenderness and no stool in rectum.  Patient awaiting ct scan.  3:33 AM Received call from radiology that patient with thickened bowel wall and perforation.  Patient reports abdominal pain for four days wosened today.  Pain all over then rlq.  Pain increased with any movement, patient has been vomiting.  Denies diarrhea, or fever.  PE Diffusely tender abdomen on palpation but remains. Soft.  Unasyn ordered and surgery paged   Results for orders placed during the hospital encounter of 07/29/11  CBC WITH DIFFERENTIAL      Component Value Range   WBC 2.7 (*) 4.0 - 10.5 K/uL   RBC 4.33  3.87 - 5.11 MIL/uL   Hemoglobin 14.1  12.0 - 15.0 g/dL   HCT 09.6  04.5 - 40.9 %   MCV 95.2  78.0 - 100.0 fL   MCH 32.6  26.0 - 34.0 pg   MCHC 34.2  30.0 - 36.0 g/dL   RDW 81.1  91.4 - 78.2 %   Platelets 247  150 - 400 K/uL   Neutrophils Relative 70  43 - 77 %   Neutro Abs 1.9  1.7 - 7.7 K/uL   Lymphocytes Relative 24  12 - 46 %   Lymphs Abs 0.7  0.7 - 4.0 K/uL   Monocytes Relative 6  3 - 12 %   Monocytes Absolute 0.2  0.1 - 1.0 K/uL   Eosinophils Relative 0  0 - 5 %   Eosinophils Absolute 0.0  0.0 - 0.7 K/uL   Basophils Relative 0  0 - 1 %   Basophils Absolute 0.0  0.0 - 0.1 K/uL  COMPREHENSIVE METABOLIC PANEL      Component Value Range   Sodium 144  135 - 145 mEq/L   Potassium 2.9 (*) 3.5 - 5.1 mEq/L   Chloride 108  96 - 112 mEq/L   CO2 19  19 - 32 mEq/L   Glucose, Bld 101 (*) 70 - 99 mg/dL   BUN 8  6 - 23 mg/dL   Creatinine, Ser 9.56  0.50 - 1.10 mg/dL   Calcium 8.7  8.4 - 21.3 mg/dL   Total Protein 6.0  6.0 - 8.3 g/dL   Albumin 2.6 (*) 3.5 - 5.2 g/dL   AST 31  0 - 37 U/L   ALT 14  0 - 35 U/L   Alkaline Phosphatase 143 (*) 39 - 117 U/L   Total Bilirubin 0.4  0.3 - 1.2 mg/dL   GFR calc non Af Amer 90 (*) >90 mL/min   GFR calc Af Amer >90  >90 mL/min  OCCULT BLOOD, POC  DEVICE      Component Value Range   Fecal Occult Bld NEGATIVE     Discussed with Dr. Michaell Cowing and he is in to evaluate and care assumed.    Hilario Quarry, MD 07/31/11 [phone]

## 2011-07-30 NOTE — Transfer of Care (Signed)
Immediate Anesthesia Transfer of Care Note  Patient: [name] Bell  Procedure(s) Performed: Procedure(s) (LRB): LAPAROSCOPIC LYSIS OF ADHESIONS (N/A) INCISION AND DRAINAGE ABSCESS (N/A) LAPAROSCOPIC SIGMOID COLECTOMY (N/A) COLOSTOMY (Left)  Patient Location: PACU  Anesthesia Type: General  Level of Consciousness: Patient remains intubated per anesthesia plan  Airway & Oxygen Therapy: Patient Spontanous Breathing, Patient remains intubated per anesthesia plan and Patient placed on Ventilator (see vital sign flow sheet for setting)  Post-op Assessment: Report given to PACU RN and Post -op Vital signs reviewed and stable  Post vital signs: Reviewed and stable  Complications: No apparent anesthesia complications

## 2011-07-30 NOTE — ED Notes (Signed)
Surgical consent completed

## 2011-07-30 NOTE — H&P (Signed)
[name] Bell is an 66 y.o. female.    Chief Complaint: Abd pain, free air in abdomen  HPI: Obese female with a history of a duodenal ulcer causing stricture status post dilation in 2008.  Also with history of ischemic colitis with tortuous distal colon in 2009.  Denies any abdominal surgeries.  Has a several day history of worsening abdominal pain.  Right-sided initially but now more in the lower abdomen.  Worst pain she has ever felt.  Became aggressively worse and more intense.  Has felt constipated for several days.  Worried that she was impacted.  Worsening nausea and vomiting.  Urinating less.    She came to the emergency room.  Initial Xray evaluation negative.  Abdominal pain persistent.  CT scan shows evidence of some dots of free air and fluid down in the pelvis and less up to the right upper abdomen.   Patient becoming tachycardic.  Her Blood pressure Is drifting down as well.  Getting IV fluid bolus.  Initially tolerated drinking the oral contrast but is now throwing it up.  She's been the ED for six hours.  Based on concerns surgical consultation was requested.   Past Medical History  Diagnosis Date  . Superior mesenteric artery syndrome 11/08    sp dilation by Dr. Ewing Schlein, Pt may require bowel resetion if sx recur  . Duodenal ulcer 11/08    With hemorrhage and obstruction  . Osteomyelitis, jaw acute 11/08    Started while in the hospital abcess showed GNR . SP debridement by Dr. Neoma Laming,  Ernesta Amble S Bovis sp 4  weeks of Pen V started on 05/19/07  with additional 2 weeks in 07/04/07  . Degenerative disk disease     This is interscapular, mild compression frx of T12 superior endplate with Schmorl's node. Seen on CT in 2/08  . Back pain   . Depression   . Acute sinusitis 06/30/2011  . Ischemic colitis 07/30/2011    2009 by endoscopy     History reviewed. No pertinent past surgical history.  Family History  Problem Relation Age of Onset  . Cancer Mother   . Heart disease Father   .  Diabetes Sister    Social History:  reports that she has been smoking Cigarettes.  She has a 40 pack-year smoking history. She does not have any smokeless tobacco history on file. She reports that she does not drink alcohol or use illicit drugs.  Allergies:  Allergies  Allergen Reactions  . Ibuprofen     REACTION: Bleeding ulcers  . Latex   . Nsaids     REACTION: Bleeding Ulcer     (Not in a hospital admission)  Results for orders placed during the hospital encounter of 07/29/11 (from the past 48 hour(s))  CBC WITH DIFFERENTIAL     Status: Abnormal   Collection Time   07/29/11 11:16 PM      Component Value Range Comment   WBC 2.7 (*) 4.0 - 10.5 K/uL    RBC 4.33  3.87 - 5.11 MIL/uL    Hemoglobin 14.1  12.0 - 15.0 g/dL    HCT 16.1  09.6 - 04.5 %    MCV 95.2  78.0 - 100.0 fL    MCH 32.6  26.0 - 34.0 pg    MCHC 34.2  30.0 - 36.0 g/dL    RDW 40.9  81.1 - 91.4 %    Platelets 247  150 - 400 K/uL    Neutrophils Relative 70  43 -  77 %    Neutro Abs 1.9  1.7 - 7.7 K/uL    Lymphocytes Relative 24  12 - 46 %    Lymphs Abs 0.7  0.7 - 4.0 K/uL    Monocytes Relative 6  3 - 12 %    Monocytes Absolute 0.2  0.1 - 1.0 K/uL    Eosinophils Relative 0  0 - 5 %    Eosinophils Absolute 0.0  0.0 - 0.7 K/uL    Basophils Relative 0  0 - 1 %    Basophils Absolute 0.0  0.0 - 0.1 K/uL   COMPREHENSIVE METABOLIC PANEL     Status: Abnormal   Collection Time   07/29/11 11:16 PM      Component Value Range Comment   Sodium 144  135 - 145 mEq/L    Potassium 2.9 (*) 3.5 - 5.1 mEq/L    Chloride 108  96 - 112 mEq/L    CO2 19  19 - 32 mEq/L    Glucose, Bld 101 (*) 70 - 99 mg/dL    BUN 8  6 - 23 mg/dL    Creatinine, Ser 9.60  0.50 - 1.10 mg/dL    Calcium 8.7  8.4 - 45.4 mg/dL    Total Protein 6.0  6.0 - 8.3 g/dL    Albumin 2.6 (*) 3.5 - 5.2 g/dL    AST 31  0 - 37 U/L    ALT 14  0 - 35 U/L    Alkaline Phosphatase 143 (*) 39 - 117 U/L    Total Bilirubin 0.4  0.3 - 1.2 mg/dL    GFR calc non Af Amer 90 (*)  >90 mL/min    GFR calc Af Amer >90  >90 mL/min   OCCULT BLOOD, POC DEVICE     Status: Normal   Collection Time   07/29/11 11:20 PM      Component Value Range Comment   Fecal Occult Bld NEGATIVE      Ct Abdomen Pelvis W Contrast  07/30/2011  *RADIOLOGY REPORT*  Clinical Data: Abdominal pain  CT ABDOMEN AND PELVIS WITH CONTRAST  Technique:  Multidetector CT imaging of the abdomen and pelvis was performed following the standard protocol during bolus administration of intravenous contrast.  Contrast: OMNIPAQUE IOHEXOL 300 MG/ML  SOLN  Comparison: 07/29/2011 radiograph, 05/25/2008 CT  Findings: Fibrotic changes at the bases.  Coronary artery calcification.  Hepatic steatosis, distended gallbladder.  No radiodense gallstones.  No pericholecystic fluid.  No biliary ductal dilatation.  Unremarkable spleen.  Unremarkable pancreas and adrenal glands.  Symmetric renal enhancement.  No hydronephrosis or hydroureter.  There is a segment of sigmoid colonic wall thickening, with pericolonic fat stranding and fluid as well as extraluminal air. No bowel obstruction.  The appendix is not identified.  Reactive sized mesenteric and retroperitoneal lymph nodes.  There is scattered atherosclerotic calcification of the aorta and its branches. No aneurysmal dilatation.  Retroaortic left renal vein.  Thin-walled bladder.  Absent uterus.  No adnexal mass.  Osteopenia.  No acute osseous finding.  Mild height loss at T12 with a prominent Schmorl's node.  L5 S1 degenerative disc disease. Marked sigmoid colonic wall thickening and a has a given the  IMPRESSION: Thickened segment of sigmoid colon with pericolonic fat stranding as well as extraluminal air and fluid, in keeping with perforation. The underlying process is favored to be a colitis (ischemic, infectious, or inflammatory) or diverticulitis.  Appendix not identified.  There is fluid and air in the right paracolic  gutter, presumably tracking upward from the sigmoid colonic  process however correlate with surgical history.  Discussed via telephone with Dr. Rosalia Hammers at 03:30 a.m. on 07/30/2011.  Original Report Authenticated By: Waneta Martins, M.D.   Dg Abd Acute W/chest  07/30/2011  *RADIOLOGY REPORT*  Clinical Data: Abdominal pain, vomiting.  ACUTE ABDOMEN SERIES (ABDOMEN 2 VIEW & CHEST 1 VIEW)  Comparison: 01/31/2008  Findings: The lungs are clear.  Heart is normal size.  No effusions or acute bony abnormality.  Moderate stool throughout the colon.  Nonobstructive bowel gas pattern.  No free air.  No organomegaly or suspicious calcification.  No acute bony abnormality.  IMPRESSION: Moderate stool burden throughout the colon.  No acute findings.  Original Report Authenticated By: Cyndie Chime, M.D.    Review of Systems  Constitutional: Positive for chills and diaphoresis. Negative for fever, weight loss and malaise/fatigue.  HENT: Positive for ear pain. Negative for neck pain and ear discharge.   Eyes: Negative for double vision and photophobia.  Respiratory: Negative for cough and shortness of breath.   Cardiovascular: Negative for chest pain and palpitations.  Gastrointestinal: Positive for nausea, vomiting, abdominal pain and constipation. Negative for heartburn, diarrhea, blood in stool and melena.  Genitourinary: Positive for flank pain. Negative for dysuria, urgency and hematuria.  Musculoskeletal: Positive for back pain. Negative for myalgias and falls.  Skin: Negative for itching and rash.  Neurological: Negative for dizziness, speech change and headaches.  Endo/Heme/Allergies: Negative for polydipsia. Does not bruise/bleed easily.  Psychiatric/Behavioral: Positive for depression. Negative for suicidal ideas, memory loss and substance abuse.    Blood pressure 111/72, pulse 120, temperature 98 F (36.7 C), temperature source Oral, resp. rate 14, last menstrual period 08/25/1980, SpO2 98.00%. Physical Exam  Constitutional: She is oriented to person,  place, and time. She appears well-developed and well-nourished.  HENT:  Head: Normocephalic and atraumatic.  Nose: Nose normal.  Mouth/Throat: Oropharynx is clear and moist. No oropharyngeal exudate.  Eyes: Conjunctivae and EOM are normal. Pupils are equal, round, and reactive to light. Right eye exhibits no discharge. Left eye exhibits no discharge. No scleral icterus.  Neck: Normal range of motion. Neck supple. No thyromegaly present.  Cardiovascular: Regular rhythm and intact distal pulses.   Respiratory: Effort normal. No respiratory distress.  GI: Soft. She exhibits no distension and no pulsatile liver. Bowel sounds are absent. There is tenderness in the right upper quadrant, right lower quadrant, suprapubic area and left lower quadrant. There is rebound and guarding. There is no rigidity. No hernia. Hernia confirmed negative in the right inguinal area and confirmed negative in the left inguinal area.       Obese.  No abd incisions  Musculoskeletal: Normal range of motion. She exhibits no edema.  Neurological: She is alert and oriented to person, place, and time. No cranial nerve deficit.  Skin: Skin is warm and dry.  Psychiatric: She has a normal mood and affect. Judgment and thought content normal. Her speech is tangential. She is is hyperactive. She is not combative. Cognition and memory are normal.     Assessment  Peritonitis involving most of the abdomen with free air.  Tachycardia and hypotension.  Low white count ominous sign.  Several day history of this.  Suspicious for colon perforation.  Possible perforated duodenal ulcer but less likely.  Plan  I think this requires emergent exploration.  Given the question of location, I would like to try and start out laparoscopically to see that can better localize  where this is.  It is a perforated diverticulitis and examinations not too bad I will try and do a laparoscopic washout with the patching of the region.  If there is massive  contamination or frank ischemia then I would plan to do a hand-assisted versus open colon resection and probable clot colostomy.  If there is evidence of duodenal or friction then I would attempt to do omental patch of the area.  I did discuss this with the patient:  The anatomy & physiology of the digestive tract was discussed.  The pathophysiology of perforation was discussed.  Differential diagnosis such as perforated ulcer or colon, etc was discussed.   Natural history risks without surgery such as death was discussed.  I recommended abdominal exploration to diagnose & treat the source of the problem.  Laparoscopic & open techniques were discussed.   Risks such as bleeding, infection, abscess, leak, reoperation, bowel resection, possible ostomy, hernia, heart attack, death, and other risks were discussed.   The risks of no intervention will lead to serious problems including death.   I expressed a good likelihood that surgery will address the problem.   Goals of post-operative recovery were discussed as well.  We will work to minimize complications although risks in an emergent setting are high.   Questions were answered.  The patient expressed understanding & wishes to proceed with surgery.      Aggressive IV fluid resuscitation  Anti-ileus protocol  Broad antibiotics  Proton pump inhibitors  Probable ICU after surgery given her evidence of early shock  DVT prophylaxis   Dajiah Kooi C. 07/30/2011, 4:33 AM

## 2011-07-31 ENCOUNTER — Encounter (HOSPITAL_COMMUNITY): Payer: Self-pay | Admitting: Surgery

## 2011-07-31 LAB — BASIC METABOLIC PANEL
BUN: 7 mg/dL (ref 6–23)
Creatinine, Ser: 0.6 mg/dL (ref 0.50–1.10)
GFR calc Af Amer: >90 mL/min (ref >90)
GFR calc non Af Amer: >90 mL/min (ref >90)
Glucose, Bld: 90 mg/dL (ref 70–99)

## 2011-07-31 LAB — CBC
MCH: 32.4 pg (ref 26.0–34.0)
MCHC: 34.4 g/dL (ref 30.0–36.0)
RDW: 13.5 % (ref 11.5–15.5)

## 2011-07-31 MED ORDER — SODIUM CHLORIDE 0.9 % IV BOLUS (SEPSIS)
500.0000 mL | Freq: Once | INTRAVENOUS | Status: AC
  Administered 2011-07-31: 500 mL via INTRAVENOUS

## 2011-07-31 MED ORDER — KCL IN DEXTROSE-NACL 20-5-0.45 MEQ/L-%-% IV SOLN
INTRAVENOUS | Status: DC
  Administered 2011-07-31 – 2011-08-09 (×9): via INTRAVENOUS
  Filled 2011-07-31 (×23): qty 1000

## 2011-07-31 MED ORDER — SODIUM CHLORIDE 0.9 % IJ SOLN
INTRAMUSCULAR | Status: AC
  Administered 2011-07-31: 10 mL
  Filled 2011-07-31: qty 10

## 2011-07-31 NOTE — Consult Note (Signed)
WOC ostomy consult  Stoma type/location:  LLQ, end colostomy Stomal assessment/size:  Pink and moist, 1 3/8 round, appears to be budded from the skin. Peristomal assessment: POD 1, did not change pouch.  Treatment options for stomal/peristomal skin: none Output scant bloody drainage Ostomy pouching: 2pc. 2 3/4" in place with good seal and minimal output.  Will order supplies for room to be used over weekend should staff need to change.  Education provided:  Provided education on creation of stoma and beginning basic care review. She is very emotional today and very concerned about pain control at this time.  She does tear up easily and I have offered emotional support for her and encouraged her to use IS and that she needs to get OOB today. I have provided colostomy educational materials at bedside and made her aware that I will continue to visit for further teaching.  WOC will follow along with you for ostomy care and teaching.  [name] Bell [place], [place] [phone]

## 2011-07-31 NOTE — Progress Notes (Signed)
Both the clear main port and blue side port of L nare NGT flushed with of air per MD order.  + air bolus with bilious drainage on LIWS.  Filter on blue port clean and dry.

## 2011-07-31 NOTE — Progress Notes (Signed)
Clear port and blue port on L nare NGT each flushed with 120cc air per MD order.  Filter clean and dry.

## 2011-07-31 NOTE — Progress Notes (Signed)
CCS/Stefan [facility]arian Progress Note 1 Day Post-Op  Subjective: Patient complaining of lots of abdominal pain.  Says pain is not much better than preop, but is better.  Using IV pain medications.  Objective: Vital signs in last 24 hours: Temp:  [97.6 F (36.4 C)-101.7 F (38.7 C)] 99.5 F (37.5 C) (07/05 0753) Pulse Rate:  [104-125] 123  (07/05 0700) Resp:  [13-27] 20  (07/05 0700) BP: (86-125)/(26-70) 109/49 mmHg (07/05 0700) SpO2:  [90 %-100 %] 97 % (07/05 0700) FiO2 (%):  [40 %-50 %] 40 % (07/04 0945) Weight:  [79 kg (174 lb 2.6 oz)] 79 kg (174 lb 2.6 oz) (07/05 0600)    Intake/Output from previous day: 07/04 0701 - 07/05 0700 In: 6025 [I.V.:4825; NG/GT:140; IV Piggyback:1060] Out: 2315 [Urine:1375; Emesis/NG output:355; Drains:385; Blood:200] Intake/Output this shift:    General: Mild acute distress, does not breath very deeply.  Lungs: Coarse rales bilaterally, no wheezed.  Oxygen saturations are good.  Abd: Soft, distended.  Ostomy is pink and viable.  Extremities: No DVT signs or symptoms.  Neuro: Intact  Lab Results:  @LABLAST2 (wbc:2,hgb:2,hct:2,plt:2) BMET  Basename 07/31/11 0421 07/30/11 0435  NA 141 143  K 3.1* 4.0  CL 106 107  CO2 24 20  GLUCOSE 90 81  BUN 7 7  CREATININE 0.60 0.55  CALCIUM 8.0* 7.9*   PT/INR No results found for this basename: LABPROT:2,INR:2 in the last 72 hours ABG No results found for this basename: PHART:2,PCO2:2,PO2:2,HCO3:2 in the last 72 hours  Studies/Results: Ct Abdomen Pelvis W Contrast  07/30/2011  *RADIOLOGY REPORT*  Clinical Data: Abdominal pain  CT ABDOMEN AND PELVIS WITH CONTRAST  Technique:  Multidetector CT imaging of the abdomen and pelvis was performed following the standard protocol during bolus administration of intravenous contrast.  Contrast: OMNIPAQUE IOHEXOL 300 MG/ML  SOLN  Comparison: 07/29/2011 radiograph, 05/25/2008 CT  Findings: Fibrotic changes at the bases.  Coronary artery calcification.  Hepatic  steatosis, distended gallbladder.  No radiodense gallstones.  No pericholecystic fluid.  No biliary ductal dilatation.  Unremarkable spleen.  Unremarkable pancreas and adrenal glands.  Symmetric renal enhancement.  No hydronephrosis or hydroureter.  There is a segment of sigmoid colonic wall thickening, with pericolonic fat stranding and fluid as well as extraluminal air. No bowel obstruction.  The appendix is not identified.  Reactive sized mesenteric and retroperitoneal lymph nodes.  There is scattered atherosclerotic calcification of the aorta and its branches. No aneurysmal dilatation.  Retroaortic left renal vein.  Thin-walled bladder.  Absent uterus.  No adnexal mass.  Osteopenia.  No acute osseous finding.  Mild height loss at T12 with a prominent Schmorl's node.  L5 S1 degenerative disc disease. Marked sigmoid colonic wall thickening and a has a given the  IMPRESSION: Thickened segment of sigmoid colon with pericolonic fat stranding as well as extraluminal air and fluid, in keeping with perforation. The underlying process is favored to be a colitis (ischemic, infectious, or inflammatory) or diverticulitis.  Appendix not identified.  There is fluid and air in the right paracolic gutter, presumably tracking upward from the sigmoid colonic process however correlate with surgical history.  Discussed via telephone with Dr. Rosalia Hammers at 03:30 a.m. on 07/30/2011.  Original Report Authenticated By: Waneta Martins, M.D.   Dg Abd Acute W/chest  07/30/2011  *RADIOLOGY REPORT*  Clinical Data: Abdominal pain, vomiting.  ACUTE ABDOMEN SERIES (ABDOMEN 2 VIEW & CHEST 1 VIEW)  Comparison: 01/31/2008  Findings: The lungs are clear.  Heart is normal size.  No effusions or  acute bony abnormality.  Moderate stool throughout the colon.  Nonobstructive bowel gas pattern.  No free air.  No organomegaly or suspicious calcification.  No acute bony abnormality.  IMPRESSION: Moderate stool burden throughout the colon.  No acute  findings.  Original Report Authenticated By: Cyndie Chime, M.D.    Anti-infectives: Anti-infectives     Start     Dose/Rate Route Frequency Ordered Stop   07/30/11 1200   cefTRIAXone (ROCEPHIN) 2 g in dextrose 5 % 50 mL IVPB     Comments: Pharmacy may adjust dosing strength / duration / interval for maximal efficacy      2 g 100 mL/hr over 30 Minutes Intravenous Every 24 hours 07/30/11 0425     07/30/11 0630   sodium chloride 0.9 % 1,000 mL with clindamycin (CLEOCIN) 900 mg, gentamicin (GARAMYCIN) 240 mg infusion        250 mL/hr over 1 Hours Intravenous Continuous 07/30/11 0629 07/30/11 0729   07/30/11 0530   metroNIDAZOLE (FLAGYL) IVPB 500 mg        500 mg 100 mL/hr over 60 Minutes Intravenous  Once 07/30/11 0529 07/30/11 0625   07/30/11 0430   metroNIDAZOLE (FLAGYL) IVPB 500 mg        500 mg 100 mL/hr over 60 Minutes Intravenous Every 6 hours 07/30/11 0425     07/30/11 0345   Ampicillin-Sulbactam (UNASYN) 3 g in sodium chloride 0.9 % 100 mL IVPB        3 g 100 mL/hr over 60 Minutes Intravenous  Once 07/30/11 0331 07/30/11 0443          Assessment/Plan: s/p Procedure(s): LAPAROSCOPIC LYSIS OF ADHESIONS INCISION AND DRAINAGE ABSCESS LAPAROSCOPIC SIGMOID COLECTOMY COLOSTOMY Continue foley due to strict I&O and urinary output monitoring Keep NGT Get patient OOB to chair. Continue antibiotics (Rocephin and Flagyl) Transfer to SDU  LOS: 2 days   Marta Lamas. Gae Bon, MD, FACS [phone] [facility] 07/31/2011

## 2011-07-31 NOTE — Progress Notes (Signed)
Dr. Andrey Campanile notified about patients HR sinus tach mid 120's and blood pressure 90-100/30-40.  Order rec'd for 500cc NS bolus over one hour.  Will administer and continue to monitor.

## 2011-07-31 NOTE — Progress Notes (Signed)
Utilization review completed.  

## 2011-07-31 NOTE — Clinical Documentation Improvement (Signed)
SEPSIS DOCUMENTATION QUERY  THIS DOCUMENT IS NOT A PERMANENT PART OF THE MEDICAL RECORD   Please update your documentation within the medical record to reflect your response to this query.                                                                                     07/31/11  Dr. Michaell Cowing and/or Associates,  In a better effort to capture your patient's severity of illness, reflect appropriate length of stay and utilization of resources, a review of the patient medical record has revealed the following indicators:  "She came to the emergency room. Initial Xray evaluation negative. Abdominal pain persistent. CT scan shows evidence of some dots of free air and fluid down in the pelvis and less up to the right upper abdomen. Patient becoming tachycardic. Her Blood pressure Is drifting down as well. Getting IV fluid bolus. Initially tolerated drinking the oral contrast but is now throwing it up. She's been the ED for six hours. Based on concerns surgical consultation was requested.  WBC 2.7 (*);     Blood pressure 111/72;    pulse 120;     temperature 98 F (36.7 C), temperature source Oral resp. rate 14;     SpO2 98.00%.  Peritonitis involving most of the abdomen with free air. Tachycardia and hypotension. Low white count ominous sign. Several day history of this. Suspicious for colon perforation. Possible perforated duodenal ulcer but less likely.  Probable ICU after surgery given her evidence of early shock" [name] Bell C.  07/30/2011, 4:33 AM     H&P   Based on your clinical judgment, please document in the progress notes and discharge summary if a condition below provides greater specificity regarding the patient's clinical picture:   - Early Sepsis with Septic Shock, Present on Admission, improved   - Sepsis with Septic Shock, Present on Admission, improved   - Sepsis, Present on Admission, improved   - Septic Shock, present on admission, improved   - Other Condition    -  Unable to Clinically Determine    Possible, Probable or Suspected may be used with inpatient documentation.  However, it must also be documented in the discharge summary.  In responding to this query please exercise your independent judgment.    The fact that a query is asked, does not imply that any particular answer is desired or expected.    Reviewed: additional documentation in the medical record  Thank You,  Jerral Ralph  RN BSN CCDS Certified Clinical Documentation Specialist Cell   [phone]  Health Information Management [employer]  TO RESPOND TO THE THIS QUERY, FOLLOW THE INSTRUCTIONS BELOW:  1. If needed, update documentation for the patient's encounter via the notes activity.  2. Access this query again and click edit on the In [employer].  3. After updating, or not, click F2 to complete all highlighted (required) fields concerning your review. Select "additional documentation in the medical record" OR "no additional documentation provided".  4. Click Sign note button.  5. The deficiency will fall out of your In Basket *Please let us know if you are not able to complete this workflow by phone  or e-mail (listed below).

## 2011-07-31 NOTE — Progress Notes (Signed)
Abdominal incision dressing changed.  Cleansed with soap and water, dry 4x4 gauze applied and covered with ABD and medipore tape.  Incision clean, dry and intact with no discharge noted.  Wicks left in place.  Bilateral ROM leg exercised performed.

## 2011-07-31 NOTE — Progress Notes (Signed)
Report called to [place], Charity fundraiser. Patient transported to 3300 and RN met at bedside. No complications.

## 2011-07-31 NOTE — Care Management Note (Signed)
    Page 1 of 1   08/04/2011     11:57:27 AM   CARE MANAGEMENT NOTE 08/04/2011  Patient:  [name] Bell, [name] Bell   Account Number:  [id number]  Date Initiated:  07/31/2011  Documentation initiated by:  AMERSON,JULIE  Subjective/Objective Assessment:   PT S/P LAPAROSCOPIC BOWEL RESECTION WITH LYSIS OF ADHESIONS AND END COLOSTOMY ON 07/30/11.  PTA, PT INDEPENDENT, LIVES ALONE.     Action/Plan:   MET WITH PT TO DISCUSS DC NEEDS.  PT STATES SHE HAS NO FAMILY OR FRIENDS TO ASSIST AT DC.  SHE MAY NEED ST-SNF FOR REHAB AT DC.  WILL CONSULT CSW TO FACILITATE POSSIBLE DC TO SNF IF NEEDED.   Anticipated DC Date:  08/04/2011   Anticipated DC Plan:  SKILLED NURSING FACILITY  In-house referral  Clinical Social Worker      DC Planning Services  CM consult      Choice offered to / List presented to:             Status of service:  In process, will continue to follow Medicare Important Message given?   (If response is "NO", the following Medicare IM given date fields will be blank) Date Medicare IM given:   Date Additional Medicare IM given:    Discharge Disposition:    Per UR Regulation:    If discussed at Long Length of Stay Meetings, dates discussed:   08/04/2011    Comments:  08/04/11- 1015- Donn Pierini RN, BSN [phone] Pt still on small amounts of clear liquids,  IV abx, PT/OT evals - ST-SNF for rehab, CSW consulted for  placement needs. Pt tx to acute floor today-  07/31/11 JULIE AMERSON,RN,BSN [address] [facility] IS AGREEABLE TO ST-SNF IF NECESSARY.  MD, PLEASE ORDER PHYSICAL AND OCCUPATIONAL THERAPY CONSULTS TO EVALUATE FOR SNF. THANKS.

## 2011-08-01 LAB — BASIC METABOLIC PANEL
BUN: 4 mg/dL — ABNORMAL LOW (ref 6–23)
Calcium: 8.1 mg/dL — ABNORMAL LOW (ref 8.4–10.5)
GFR calc Af Amer: >90 mL/min (ref >90)
GFR calc non Af Amer: >90 mL/min (ref >90)
Glucose, Bld: 126 mg/dL — ABNORMAL HIGH (ref 70–99)
Potassium: 3.3 mEq/L — ABNORMAL LOW (ref 3.5–5.1)
Sodium: 137 mEq/L (ref 135–145)

## 2011-08-01 LAB — CBC
HCT: 32.7 % — ABNORMAL LOW (ref 36.0–46.0)
Hemoglobin: 10.9 g/dL — ABNORMAL LOW (ref 12.0–15.0)
MCH: 31.8 pg (ref 26.0–34.0)
MCHC: 33.3 g/dL (ref 30.0–36.0)
RDW: 13.8 % (ref 11.5–15.5)

## 2011-08-01 MED ORDER — SODIUM CHLORIDE 0.9 % IJ SOLN
INTRAMUSCULAR | Status: AC
  Administered 2011-08-01: 10 mL
  Filled 2011-08-01: qty 10

## 2011-08-01 MED ORDER — IPRATROPIUM-ALBUTEROL 18-103 MCG/ACT IN AERO
2.0000 | INHALATION_SPRAY | Freq: Four times a day (QID) | RESPIRATORY_TRACT | Status: DC
  Administered 2011-08-01 – 2011-08-03 (×10): 2 via RESPIRATORY_TRACT
  Filled 2011-08-01: qty 14.7

## 2011-08-01 NOTE — Progress Notes (Signed)
CCS/Paytience Bures Progress Note 2 Days Post-Op  Subjective: Doing better today.  Caoughing but not mobilizing secretions that well.  Objective: Vital signs in last 24 hours: Temp:  [98.2 F (36.8 C)-100.5 F (38.1 C)] 98.2 F (36.8 C) (07/06 0700) Pulse Rate:  [104-123] 104  (07/06 0700) Resp:  [10-21] 10  (07/06 0700) BP: (101-124)/(35-58) 106/49 mmHg (07/06 0700) SpO2:  [96 %-99 %] 96 % (07/06 0700) Last BM Date: 07/28/11  Intake/Output from previous day: 07/05 0701 - 07/06 0700 In: 3190 [P.O.:120; I.V.:2700; IV Piggyback:370] Out: 1575 [Urine:1170; Emesis/NG output:250; Drains:155] Intake/Output this shift: Total I/O In: 125 [I.V.:125] Out: -   General: No acute distress, says she feels better than yesterday.  Lungs: Coarse rales bilaterally  Abd: Soft, minimal bowel sounds, NGT with minimal drainage.  Extremities: No changes to indicate DVT  Neuro: Intact  Lab Results:  @LABLAST2 (wbc:2,hgb:2,hct:2,plt:2) BMET  Basename 08/01/11 0640 07/31/11 0421  NA 137 141  K 3.3* 3.1*  CL 104 106  CO2 27 24  GLUCOSE 126* 90  BUN 4* 7  CREATININE 0.49* 0.60  CALCIUM 8.1* 8.0*   PT/INR No results found for this basename: LABPROT:2,INR:2 in the last 72 hours ABG No results found for this basename: PHART:2,PCO2:2,PO2:2,HCO3:2 in the last 72 hours  Studies/Results: No results found.  Anti-infectives: Anti-infectives     Start     Dose/Rate Route Frequency Ordered Stop   07/30/11 1200   cefTRIAXone (ROCEPHIN) 2 g in dextrose 5 % 50 mL IVPB     Comments: Pharmacy may adjust dosing strength / duration / interval for maximal efficacy      2 g 100 mL/hr over 30 Minutes Intravenous Every 24 hours 07/30/11 0425     07/30/11 0630   sodium chloride 0.9 % 1,000 mL with clindamycin (CLEOCIN) 900 mg, gentamicin (GARAMYCIN) 240 mg infusion        250 mL/hr over 1 Hours Intravenous Continuous 07/30/11 0629 07/30/11 0729   07/30/11 0530   metroNIDAZOLE (FLAGYL) IVPB 500 mg        500  mg 100 mL/hr over 60 Minutes Intravenous  Once 07/30/11 0529 07/30/11 0625   07/30/11 0430   metroNIDAZOLE (FLAGYL) IVPB 500 mg        500 mg 100 mL/hr over 60 Minutes Intravenous Every 6 hours 07/30/11 0425     07/30/11 0345   Ampicillin-Sulbactam (UNASYN) 3 g in sodium chloride 0.9 % 100 mL IVPB        3 g 100 mL/hr over 60 Minutes Intravenous  Once 07/30/11 0331 07/30/11 0443          Assessment/Plan: s/p Procedure(s): LAPAROSCOPIC LYSIS OF ADHESIONS INCISION AND DRAINAGE ABSCESS LAPAROSCOPIC SIGMOID COLECTOMY COLOSTOMY d/c foley DC NGT Ice chips okay Combivent inhaler.  LOS: 3 days   Marta Lamas. Gae Bon, MD, FACS [phone] [facility] 08/01/2011

## 2011-08-01 NOTE — Progress Notes (Signed)
Pt O2 sats dropped into mid 80's on 3L [name] Bell. Oxygen was increased first to 4L then 6L [name] Bell with no improvement. Pt was then first placed on 40% venti-mask then increased to 50%, with little no improvement, sats remaining in the mid to low 80's. Rapid response and respiratory therapy were called. Pt placed on non-rebreather. Pt was able to productively cough and deep breath. Sats increased to high 90's and maintained. Pt oxygen then weaned down to 4L [name] Bell, with O2 sats in the mid 90's. Will continue to monitor.

## 2011-08-02 LAB — CBC
Platelets: 245 10*3/uL (ref 150–400)
RBC: 3.32 MIL/uL — ABNORMAL LOW (ref 3.87–5.11)
WBC: 8.4 10*3/uL (ref 4.0–10.5)

## 2011-08-02 NOTE — Progress Notes (Signed)
Patient ID: [name] Bell, female   DOB: [birth year], 66 y.o.   MRN: [id number] [facility] Surgery Progress Note:   3 Days Post-Op  Subjective: Mental status is clear.  No complaints Objective: Vital signs in last 24 hours: Temp:  [98.2 F (36.8 C)-99.8 F (37.7 C)] 99 F (37.2 C) (07/07 0700) Pulse Rate:  [98-113] 106  (07/07 0847) Resp:  [9-24] 22  (07/07 0847) BP: (115-125)/(46-52) 115/46 mmHg (07/07 0700) SpO2:  [95 %-100 %] 97 % (07/07 0847) FiO2 (%):  [32 %] 32 % (07/06 1557)  Intake/Output from previous day: 07/06 0701 - 07/07 0700 In: 3421.9 [I.V.:2997.9; IV Piggyback:424] Out: 1735 [Urine:1525; Emesis/NG output:25; Drains:165; Stool:20] Intake/Output this shift: Total I/O In: 250 [I.V.:250] Out: 135 [Urine:100; Drains:35]  Physical Exam: Work of breathing is  Not labored.  JP serous.  Ostomy pink.   Wicks removed from Pfaninstiel incision  Lab Results:  Results for orders placed during the hospital encounter of 07/29/11 (from the past 48 hour(s))  CBC     Status: Abnormal   Collection Time   08/01/11  6:40 AM      Component Value Range Comment   WBC 13.1 (*) 4.0 - 10.5 K/uL    RBC 3.43 (*) 3.87 - 5.11 MIL/uL    Hemoglobin 10.9 (*) 12.0 - 15.0 g/dL    HCT 40.9 (*) 81.1 - 46.0 %    MCV 95.3  78.0 - 100.0 fL    MCH 31.8  26.0 - 34.0 pg    MCHC 33.3  30.0 - 36.0 g/dL    RDW 91.4  78.2 - 95.6 %    Platelets 250  150 - 400 K/uL   BASIC METABOLIC PANEL     Status: Abnormal   Collection Time   08/01/11  6:40 AM      Component Value Range Comment   Sodium 137  135 - 145 mEq/L    Potassium 3.3 (*) 3.5 - 5.1 mEq/L    Chloride 104  96 - 112 mEq/L    CO2 27  19 - 32 mEq/L    Glucose, Bld 126 (*) 70 - 99 mg/dL    BUN 4 (*) 6 - 23 mg/dL    Creatinine, Ser 2.13 (*) 0.50 - 1.10 mg/dL    Calcium 8.1 (*) 8.4 - 10.5 mg/dL    GFR calc non Af Amer >90  >90 mL/min    GFR calc Af Amer >90  >90 mL/min   CBC     Status: Abnormal   Collection Time   08/02/11  9:05 AM   Component Value Range Comment   WBC 8.4  4.0 - 10.5 K/uL    RBC 3.32 (*) 3.87 - 5.11 MIL/uL    Hemoglobin 10.6 (*) 12.0 - 15.0 g/dL    HCT 08.6 (*) 57.8 - 46.0 %    MCV 97.0  78.0 - 100.0 fL    MCH 31.9  26.0 - 34.0 pg    MCHC 32.9  30.0 - 36.0 g/dL    RDW 46.9  62.9 - 52.8 %    Platelets 245  150 - 400 K/uL     Radiology/Results: No results found.  Anti-infectives: Anti-infectives     Start     Dose/Rate Route Frequency Ordered Stop   07/30/11 1200   cefTRIAXone (ROCEPHIN) 2 g in dextrose 5 % 50 mL IVPB     Comments: Pharmacy may adjust dosing strength / duration / interval for maximal efficacy  2 g 100 mL/hr over 30 Minutes Intravenous Every 24 hours 07/30/11 0425     07/30/11 0630   sodium chloride 0.9 % 1,000 mL with clindamycin (CLEOCIN) 900 mg, gentamicin (GARAMYCIN) 240 mg infusion        250 mL/hr over 1 Hours Intravenous Continuous 07/30/11 0629 07/30/11 0729   07/30/11 0530   metroNIDAZOLE (FLAGYL) IVPB 500 mg        500 mg 100 mL/hr over 60 Minutes Intravenous  Once 07/30/11 0529 07/30/11 0625   07/30/11 0430   metroNIDAZOLE (FLAGYL) IVPB 500 mg        500 mg 100 mL/hr over 60 Minutes Intravenous Every 6 hours 07/30/11 0425     07/30/11 0345   Ampicillin-Sulbactam (UNASYN) 3 g in sodium chloride 0.9 % 100 mL IVPB        3 g 100 mL/hr over 60 Minutes Intravenous  Once 07/30/11 0331 07/30/11 0443          Assessment/Plan: Problem List: Patient Active Problem List  Diagnosis  . VITAMIN B12 DEFICIENCY  . HYPERLIPIDEMIA  . TOBACCO ABUSE  . DEPRESSION  . Chronic pain syndrome  . VISUAL ACUITY, DECREASED, LEFT EYE  . EXTERNAL HEMORRHOIDS  . TEMPOROMANDIBULAR JOINT PAIN  . ACUTE DUODENAL ULCER W/HEMORRHAGE&OBSTRUCTION  . OSTEOPOROSIS  . COMPRESSION FRACTURE, THORACIC VERTEBRA  . OTH&UNS SUP INJR OTH MX&UNS SITE W/O MENTION INF  . CARCINOMA, BASAL CELL, CHEEK, LEFT  . Constipation  . Colon perforation, stercoral vs diverticular    Making progress.   Will offer clear liquid diet.   3 Days Post-Op    LOS: 4 days   Matt B. Daphine Deutscher, MD, [facility] [phone] beeper [phone]  08/02/2011 11:08 AM

## 2011-08-03 LAB — BASIC METABOLIC PANEL
CO2: 31 mEq/L (ref 19–32)
Chloride: 99 mEq/L (ref 96–112)
Sodium: 137 mEq/L (ref 135–145)

## 2011-08-03 MED ORDER — POTASSIUM CHLORIDE 10 MEQ/100ML IV SOLN
10.0000 meq | INTRAVENOUS | Status: AC
  Administered 2011-08-03 (×4): 10 meq via INTRAVENOUS
  Filled 2011-08-03 (×4): qty 100

## 2011-08-03 MED ORDER — SODIUM CHLORIDE 0.9 % IJ SOLN
INTRAMUSCULAR | Status: AC
  Administered 2011-08-03: 10 mL
  Filled 2011-08-03: qty 10

## 2011-08-03 MED ORDER — HYDROCODONE-ACETAMINOPHEN 5-325 MG PO TABS
1.0000 | ORAL_TABLET | ORAL | Status: DC | PRN
  Administered 2011-08-03 – 2011-08-11 (×24): 2 via ORAL
  Filled 2011-08-03 (×24): qty 2

## 2011-08-03 NOTE — Progress Notes (Signed)
Patient ID: CORINE SOLORIO, female   DOB: [birth year], 66 y.o.   MRN: [id number] Subjective: Tolerating liquids in small amounts Still working on pain control  Objective: Physical Exam: BP 121/52  Pulse 112  Temp 98.1 F (36.7 C) (Oral)  Resp 16  Ht 5\' 4"  (1.626 m)  Wt 174 lb 2.6 oz (79 kg)  BMI 29.90 kg/m2  SpO2 95%  LMP 08/25/1980 Lungs clear Abdomen soft, wound stable, drain serosang    Labs: CBC  Basename 08/02/11 0905 08/01/11 0640  WBC 8.4 13.1*  HGB 10.6* 10.9*  HCT 32.2* 32.7*  PLT 245 250   BMET  Basename 08/01/11 0640  NA 137  K 3.3*  CL 104  CO2 27  GLUCOSE 126*  BUN 4*  CREATININE 0.49*  CALCIUM 8.1*   LFT No results found for this basename: PROT,ALBUMIN,AST,ALT,ALKPHOS,BILITOT,BILIDIR,IBILI,LIPASE in the last 72 hours PT/INR No results found for this basename: LABPROT:2,INR:2 in the last 72 hours   Studies/Results: No results found.  Assessment/Plan: S/p perf divertic, colostomy  Keep on clears Iv antibiotics    LOS: 5 days    Vitalia Stough A MD 08/03/2011 7:52 AM

## 2011-08-04 LAB — BASIC METABOLIC PANEL
CO2: 33 mEq/L — ABNORMAL HIGH (ref 19–32)
Chloride: 103 mEq/L (ref 96–112)
GFR calc non Af Amer: >90 mL/min (ref >90)
Glucose, Bld: 110 mg/dL — ABNORMAL HIGH (ref 70–99)
Potassium: 3.5 mEq/L (ref 3.5–5.1)
Sodium: 143 mEq/L (ref 135–145)

## 2011-08-04 MED ORDER — IPRATROPIUM-ALBUTEROL 18-103 MCG/ACT IN AERO
2.0000 | INHALATION_SPRAY | Freq: Three times a day (TID) | RESPIRATORY_TRACT | Status: DC
  Administered 2011-08-04 – 2011-08-10 (×17): 2 via RESPIRATORY_TRACT
  Filled 2011-08-04: qty 14.7

## 2011-08-04 MED ORDER — SODIUM CHLORIDE 0.9 % IJ SOLN
INTRAMUSCULAR | Status: AC
  Administered 2011-08-04: 10 mL
  Filled 2011-08-04: qty 10

## 2011-08-04 NOTE — Progress Notes (Signed)
Utilization review completed.  

## 2011-08-04 NOTE — Progress Notes (Signed)
Occupational Therapy Evaluation Patient Details Name: [name] Bell MRN: [id number] DOB: 1945/10/24 Today's Date: 08/04/2011 Time: 7829-5621 OT Time Calculation (min): 27 min  OT Assessment / Plan / Recommendation Clinical Impression  Pt. s/p Laparoscopic bowel resection with lysis of adhesions and end colostomy. Pt. will benefit from OT to increase independence and safety in ADLs prior to d/c. Pt. lived alone in apartment prior to admission. Would recommend pt. to d/c to SNF. OT to follow acutely.    OT Assessment  Patient needs continued OT Services    Follow Up Recommendations  Skilled nursing facility;Supervision/Assistance - 24 hour    Barriers to Discharge      Equipment Recommendations   (TBD)    Recommendations for Other Services    Frequency  Min 2X/week    Precautions / Restrictions Precautions Precautions: Fall Restrictions Weight Bearing Restrictions: No   Pertinent Vitals/Pain Vitals Monitored and Stable. Pt. Reported general pain all over but did not quantify.     ADL  Eating/Feeding: Performed;Modified independent Where Assessed - Eating/Feeding: Chair Grooming: Performed;Brushing hair;Set up Where Assessed - Grooming: Unsupported sitting Upper Body Bathing: Simulated;Set up Where Assessed - Upper Body Bathing: Unsupported sitting Upper Body Dressing: Simulated;Set up Where Assessed - Upper Body Dressing: Unsupported sitting Toilet Transfer: Simulated;Min guard Toilet Transfer Method: Sit to Barista: Regular height toilet Equipment Used: Gait belt Transfers/Ambulation Related to ADLs: Pt. performed sit to stand from bed with Min G. ADL Comments: Pt. required Setup A with simulated ADLs and Min G with simulated toilet transfer. Pt. was left sitting in chair at end of session eating breakfast with Mod I.     OT Diagnosis: Generalized weakness;Cognitive deficits;Acute pain  OT Problem List: Decreased strength;Decreased activity  tolerance;Impaired balance (sitting and/or standing);Decreased cognition;Pain;Decreased safety awareness OT Treatment Interventions: Self-care/ADL training;Therapeutic activities;Patient/family education;Balance training;DME and/or AE instruction   OT Goals Acute Rehab OT Goals OT Goal Formulation: With patient Time For Goal Achievement: 08/18/11 Potential to Achieve Goals: Good ADL Goals Pt Will Perform Grooming: Standing at sink;with modified independence ADL Goal: Grooming - Progress: Goal set today Pt Will Perform Upper Body Bathing: with modified independence;Sitting at sink ADL Goal: Upper Body Bathing - Progress: Goal set today Pt Will Perform Lower Body Bathing: with supervision;Sit to stand from chair ADL Goal: Lower Body Bathing - Progress: Goal set today Pt Will Perform Upper Body Dressing: with modified independence;Sitting, chair;Sitting, bed ADL Goal: Upper Body Dressing - Progress: Goal set today Pt Will Perform Lower Body Dressing: with supervision;Sit to stand from bed;Sit to stand from chair ADL Goal: Lower Body Dressing - Progress: Goal set today Pt Will Transfer to Toilet: with modified independence ADL Goal: Toilet Transfer - Progress: Goal set today Pt Will Perform Toileting - Clothing Manipulation: with modified independence;Standing ADL Goal: Toileting - Clothing Manipulation - Progress: Goal set today Pt Will Perform Toileting - Hygiene: with supervision;Sit to stand from 3-in-1/toilet ADL Goal: Toileting - Hygiene - Progress: Goal set today  Visit Information  Last OT Received On: 08/04/11 Assistance Needed: +1 PT/OT Co-Evaluation/Treatment: Yes    Subjective Data   "I did Tae Bo exercise this morning."   Prior Functioning  Home Living Lives With: Alone Type of Home: Apartment Home Access: Level entry Home Layout: One level Additional Comments: Pt. poor historian due to decreased cognition (i.e. patient speaking on topics/comments out of  context) Communication Communication: No difficulties    Cognition  Overall Cognitive Status: Impaired Area of Impairment: Safety/judgement;Problem solving;Attention Arousal/Alertness: Awake/alert Orientation  Level: Oriented X4 / Intact Behavior During Session: [facility] for tasks performed Current Attention Level: Sustained Safety/Judgement: Decreased safety judgement for tasks assessed Problem Solving: Pt. needed several vc's for bed mobility Cognition - Other Comments: Pt. laughed at inappropriate times and spoke out of context about things not relevant to conversation    Extremity/Trunk Assessment Right Upper Extremity Assessment RUE ROM/Strength/Tone: [facility] for tasks assessed RUE Coordination: WFL - gross/fine motor Left Upper Extremity Assessment LUE ROM/Strength/Tone: WFL for tasks assessed LUE Coordination: WFL - gross/fine motor   Mobility Bed Mobility Bed Mobility: Rolling Right;Right Sidelying to Sit Rolling Right: 3: Mod assist Right Sidelying to Sit: 3: Mod assist Details for Bed Mobility Assistance: Pt. required max cues for bed mobility and physical A to lift shoulders and legs off bed.  Transfers Transfers: Sit to Stand;Stand to Sit Sit to Stand: 4: Min guard;From bed;With upper extremity assist Stand to Sit: 4: Min guard;With upper extremity assist;To chair/3-in-1           End of Session OT - End of Session Activity Tolerance: Patient tolerated treatment well Patient left: in chair;with call bell/phone within reach  GO     Pt reports residence as [facility] however unable to locate information for any such facility. A senior apartment (section 8) housing named [facility] is located on [address]. Question if pt is resident of this apartment complex. OT attempted to call location x 3 without success.  Jenell Milliner 08/04/2011, 10:21 AM  Lucile Shutters   OTR/L Pager: [phone] Office: [phone] .

## 2011-08-04 NOTE — Consult Note (Signed)
WOC ostomy consult  Stoma type/location: LLQ, end colostomy Stomal assessment/size:  1 3/4" round, budded nicely from the skin Peristomal assessment: intact without problems Treatment options for stomal/peristomal skin: none needed Output: minimal bloody drainage in her pouch Ostomy pouching: 2pc. 2 1/4" wafer and pouch used. Demonstrated measuring stoma, cutting new wafer, attachment of pouch to wafer, and lock and roll closure.  Had pt practice with lock and roll closure system. Education provided: reviewed colostomy education booklet with pt.,  She will review as well more in detail. She reports she lives alone but has some assistance from a neighbor that she will see if she can come in on Friday for additional teaching session. Would recommend [facility] for further teaching of ostomy care and management. Supplies in the room should bedside nursing need them. Enrolled pt in [employer] Secure Start dc program and samples sent to pts home address.   WOC will follow along with you for ostomy care and teaching.  Quinn Quam [place], [place] [phone]

## 2011-08-04 NOTE — Progress Notes (Signed)
Report called to receiving RN on 6N.  Pt transferred via bed with O2 to 6N09.

## 2011-08-04 NOTE — Progress Notes (Signed)
Clinical Social Work Department BRIEF PSYCHOSOCIAL ASSESSMENT 08/04/2011  Patient:  [name] Bell, [name] Bell     Account Number:  [id number]     Admit date:  07/29/2011  Clinical Social Worker:  Dennison Bulla  Date/Time:  08/04/2011 11:00 AM  Referred by:  Physician  Date Referred:  08/04/2011 Referred for  SNF Placement   Other Referral:   Interview type:  Patient Other interview type:    PSYCHOSOCIAL DATA Living Status:  ALONE Admitted from facility:   Level of care:   Primary support name:  [name] Primary support relationship to patient:  FRIEND Degree of support available:   Lacking    CURRENT CONCERNS Current Concerns  Post-Acute Placement   Other Concerns:    SOCIAL WORK ASSESSMENT / PLAN CSW received referral due to PT/OT recommending SNF for patient. CSW reviewed chart and met with patient at bedside. No visitors were present.    CSW introduced myself and explained role. CSW explained PT/OT recommendations for SNF. Patient reports before admission she was living alone and agrees that she needs therapy at dc. Per patient, she has received outpatient therapy but has never been to a facility. CSW explained Medicare benefits and Medicaid benefits in relation to SNF. Patient is agreeable to SNF and prefers [facility]. CSW provided patient with a SNF list to review.    CSW submitted for pasarr, completed FL2 and faxed out to [facility]. CSW will continue to follow and will give bed offers.   Assessment/plan status:  Psychosocial Support/Ongoing Assessment of Needs Other assessment/ plan:   Information/referral to community resources:   SNF list    PATIENT'S/FAMILY'S RESPONSE TO PLAN OF CARE: Patient was alert and oriented. Patient reports that she is agreeable to SNF and prefers something near her home. Patient was engaged throughout assessment and asked appropriate questions.

## 2011-08-04 NOTE — Progress Notes (Addendum)
Clinical Social Work Department CLINICAL SOCIAL WORK PLACEMENT NOTE 08/04/2011  Patient:  [name] Bell, [name] Bell  Account Number:  [id number] Admit date:  07/29/2011  Clinical Social Worker:  Unk Lightning, LCSW  Date/time:  08/04/2011 11:00 AM  Clinical Social Work is seeking post-discharge placement for this patient at the following level of care:   SKILLED NURSING   (*CSW will update this form in Epic as items are completed)   08/04/2011  Patient/family provided with [facility] Department of Clinical Social Work's list of facilities offering this level of care within the geographic area requested by the patient (or if unable, by the patient's family).  08/04/2011  Patient/family informed of their freedom to choose among providers that offer the needed level of care, that participate in Medicare, Medicaid or managed care program needed by the patient, have an available bed and are willing to accept the patient.  08/04/2011  Patient/family informed of [facility]' ownership interest in [facility], as well as of the fact that they are under no obligation to receive care at this facility.  PASARR submitted to [employer] on 08/04/2011 PASARR number received from [employer] on 08/04/2011  FL2 transmitted to all facilities in geographic area requested by pt/family on  08/04/2011 FL2 transmitted to all facilities within larger geographic area on   Patient informed that his/her managed care company has contracts with or will negotiate with  certain facilities, including the following:     Patient/family informed of bed offers received:  08/05/11 Patient chooses bed at [facility] Physician recommends and patient chooses bed at    Patient to be transferred to [facility]  on  08/11/11 Patient to be transferred to facility by [facility]  The following physician request were entered in Epic:   Additional Comments:

## 2011-08-04 NOTE — Evaluation (Signed)
Physical Therapy Evaluation Patient Details Name: [name] Bell MRN: [id number] DOB: 15-Oct-1945 Today's Date: 08/04/2011 Time: 4098-1191 PT Time Calculation (min): 28 min  PT Assessment / Plan / Recommendation Clinical Impression  pt presents with Peforated colon s/p Colostomy.  pt with cognitive deficits, but ? if this is baseline for pt.  She is often making comments off topics and laughs inappropriately.  pt also talking out loud to herself when no one else in room.  Feel pt will need SNF at D/C.      PT Assessment  Patient needs continued PT services    Follow Up Recommendations  Skilled nursing facility    Barriers to Discharge Other (comment) pt unclear about home situation and PLOF, but per chart pt lives in an apartment alone.      Equipment Recommendations  Defer to next venue    Recommendations for Other Services     Frequency Min 3X/week    Precautions / Restrictions Precautions Precautions: Fall Restrictions Weight Bearing Restrictions: No   Pertinent Vitals/Pain Pt indicates back stiffness.  RN pre-medicated.        Mobility  Bed Mobility Bed Mobility: Rolling Right;Right Sidelying to Sit Rolling Right: 3: Mod assist Right Sidelying to Sit: 3: Mod assist Details for Bed Mobility Assistance: Pt. required max cues for bed mobility and physical A to lift shoulders and legs off bed.  pt at times stared at PT as if not understanding cueing and needed cues repeated and re-phrased maultiple times.    Transfers Transfers: Sit to Stand;Stand to [employer] Transfers Sit to Stand: 4: Min guard;From bed;With upper extremity assist Stand to Sit: 4: Min guard;With upper extremity assist;To chair/3-in-1 Stand Pivot Transfers: 4: Min assist Details for Transfer Assistance: cues for safe technique and again needing cues repeated.  pt needed cues for movment of RW, positioning in RW Ambulation/Gait Ambulation/Gait Assistance: Not tested (comment) Stairs: No Wheelchair  Mobility Wheelchair Mobility: No    Exercises     PT Diagnosis: Difficulty walking  PT Problem List: Decreased activity tolerance;Decreased balance;Decreased mobility;Decreased cognition;Decreased knowledge of use of DME;Decreased safety awareness PT Treatment Interventions: DME instruction;Gait training;Stair training;Functional mobility training;Therapeutic activities;Therapeutic exercise;Balance training;Cognitive remediation;Patient/family education   PT Goals Acute Rehab PT Goals PT Goal Formulation: With patient Time For Goal Achievement: 08/18/11 Potential to Achieve Goals: Good Pt will go Supine/Side to Sit: with modified independence PT Goal: Supine/Side to Sit - Progress: Goal set today Pt will go Sit to Supine/Side: with modified independence PT Goal: Sit to Supine/Side - Progress: Goal set today Pt will go Sit to Stand: with modified independence PT Goal: Sit to Stand - Progress: Goal set today Pt will go Stand to Sit: with modified independence PT Goal: Stand to Sit - Progress: Goal set today Pt will Ambulate: >150 feet;with supervision;with least restrictive assistive device PT Goal: Ambulate - Progress: Goal set today  Visit Information  Last PT Received On: 08/04/11 Assistance Needed: +1 PT/OT Co-Evaluation/Treatment: Yes    Subjective Data  Subjective: pt indicating RN looks like [employer].   Patient Stated Goal: Home   Prior Functioning  Home Living Lives With: Alone Type of Home: Apartment Home Access: Level entry Home Layout: One level Additional Comments: Pt. poor historian due to decreased cognition (i.e. patient speaking on topics/comments out of context) Prior Function Comments: Unclear PLOF due to poor historian and off cognition.   Communication Communication: No difficulties    Cognition  Overall Cognitive Status: Impaired Area of Impairment: Safety/judgement;Problem solving;Attention Arousal/Alertness:  Awake/alert Orientation Level:  Oriented X4 / Intact Behavior During Session: [facility] for tasks performed Current Attention Level: Sustained Safety/Judgement: Decreased safety judgement for tasks assessed Problem Solving: Pt. needed several vc's for bed mobility Cognition - Other Comments: Pt. laughed at inappropriate times and spoke out of context about things not relevant to conversation    Extremity/Trunk Assessment Right Upper Extremity Assessment RUE ROM/Strength/Tone: [facility] for tasks assessed RUE Coordination: WFL - gross/fine motor Left Upper Extremity Assessment LUE ROM/Strength/Tone: WFL for tasks assessed LUE Coordination: WFL - gross/fine motor Right Lower Extremity Assessment RLE ROM/Strength/Tone: WFL for tasks assessed RLE Sensation: WFL - Light Touch Left Lower Extremity Assessment LLE ROM/Strength/Tone: WFL for tasks assessed LLE Sensation: WFL - Light Touch   Balance Balance Balance Assessed: Yes Static Standing Balance Static Standing - Balance Support: Bilateral upper extremity supported;During functional activity Static Standing - Level of Assistance: 5: Stand by assistance Static Standing - Comment/# of Minutes: when Bil UEs are supported pt able to maintain balance with S, but if pt becomes distracted needs MinA for balance.    End of Session PT - End of Session Equipment Utilized During Treatment: Gait belt Activity Tolerance: Patient tolerated treatment well Patient left: in chair;with call bell/phone within reach Nurse Communication: Mobility status  GP     Sunny Schlein, [place] [phone] 08/04/2011, 10:47 AM

## 2011-08-04 NOTE — Progress Notes (Signed)
5 Days Post-Op  Subjective: Complains of chronic back pain Mild nausea but taking liquids  Objective: Vital signs in last 24 hours: Temp:  [97.7 F (36.5 C)-98.3 F (36.8 C)] 98.1 F (36.7 C) (07/09 0335) Pulse Rate:  [93-112] 103  (07/09 0335) Resp:  [11-18] 18  (07/09 0335) BP: (104-123)/(35-53) 123/53 mmHg (07/09 0335) SpO2:  [90 %-98 %] 98 % (07/09 0730) Last BM Date: 07/28/11 (PTA. Pt has colostomy.)  Intake/Output from previous day: 07/08 0701 - 07/09 0700 In: 2862.5 [P.O.:240; I.V.:1812.5; IV Piggyback:810] Out: 3115 [Urine:2940; Drains:175] Intake/Output this shift:   Abdomen soft, non distended Ostomy pink Incision clean Drain serosang  Lab Results:   [facility] 08/02/11 0905  WBC 8.4  HGB 10.6*  HCT 32.2*  PLT 245   BMET  Basename 08/04/11 0447 08/03/11 1120  NA 143 137  K 3.5 3.1*  CL 103 99  CO2 33* 31  GLUCOSE 110* 132*  BUN <3* <3*  CREATININE 0.44* 0.43*  CALCIUM 8.1* 8.0*   PT/INR No results found for this basename: LABPROT:2,INR:2 in the last 72 hours ABG No results found for this basename: PHART:2,PCO2:2,PO2:2,HCO3:2 in the last 72 hours  Studies/Results: No results found.  Anti-infectives: Anti-infectives     Start     Dose/Rate Route Frequency Ordered Stop   07/30/11 1200   cefTRIAXone (ROCEPHIN) 2 g in dextrose 5 % 50 mL IVPB     Comments: Pharmacy may adjust dosing strength / duration / interval for maximal efficacy      2 g 100 mL/hr over 30 Minutes Intravenous Every 24 hours 07/30/11 0425     07/30/11 0630   sodium chloride 0.9 % 1,000 mL with clindamycin (CLEOCIN) 900 mg, gentamicin (GARAMYCIN) 240 mg infusion        250 mL/hr over 1 Hours Intravenous Continuous 07/30/11 0629 07/30/11 0729   07/30/11 0530   metroNIDAZOLE (FLAGYL) IVPB 500 mg        500 mg 100 mL/hr over 60 Minutes Intravenous  Once 07/30/11 0529 07/30/11 0625   07/30/11 0430   metroNIDAZOLE (FLAGYL) IVPB 500 mg        500 mg 100 mL/hr over 60 Minutes  Intravenous Every 6 hours 07/30/11 0425     07/30/11 0345   Ampicillin-Sulbactam (UNASYN) 3 g in sodium chloride 0.9 % 100 mL IVPB        3 g 100 mL/hr over 60 Minutes Intravenous  Once 07/30/11 0331 07/30/11 0443          Assessment/Plan: s/p Procedure(s) (LRB): LAPAROSCOPIC LYSIS OF ADHESIONS (N/A) INCISION AND DRAINAGE ABSCESS (N/A) LAPAROSCOPIC SIGMOID COLECTOMY (N/A) COLOSTOMY (Left)  Transfer to regular surgical floor  LOS: 6 days    Sheritta Deeg A 08/04/2011

## 2011-08-05 NOTE — Progress Notes (Signed)
PT Cancellation Note  Treatment cancelled today due to medical issues with patient which prohibited therapy. Pt too nauseated at this point in time for ambulation. Will f/u tomorrow. Encouraged pt to ambulate with staff later today as she feels able.   [facility] HELEN 08/05/2011, 2:34 PM Pager: [phone]

## 2011-08-05 NOTE — Progress Notes (Signed)
Patient ID: [name] [name], female   DOB: August 12, 1945, 66 y.o.   MRN: [id number] 6 Days Post-Op  Subjective: Feels much better today, less nausea, denies vomiting, abd sore, had bag "burped" this am.  Objective: Vital signs in last 24 hours: Temp:  [97.6 F (36.4 C)-99.5 F (37.5 C)] 98.4 F (36.9 C) (07/10 0535) Pulse Rate:  [96-116] 96  (07/10 0535) Resp:  [18-20] 19  (07/10 0535) BP: (118-139)/(41-81) 127/53 mmHg (07/10 0535) SpO2:  [88 %-98 %] 93 % (07/10 0700) Last BM Date: 07/28/11 (PTA. Pt has colostomy.)  Intake/Output from previous day: 07/09 0701 - 07/10 0700 In: 825 [P.O.:600; I.V.:225] Out: 1440 [Urine:1400; Drains:40] Intake/Output this shift:   Abdomen soft, non distended Ostomy pink without output today Incision c/d/i Drain serosang  Lab Results:   Basename 08/02/11 0905  WBC 8.4  HGB 10.6*  HCT 32.2*  PLT 245   BMET  Basename 08/04/11 0447 08/03/11 1120  NA 143 137  K 3.5 3.1*  CL 103 99  CO2 33* 31  GLUCOSE 110* 132*  BUN <3* <3*  CREATININE 0.44* 0.43*  CALCIUM 8.1* 8.0*   PT/INR No results found for this basename: LABPROT:2,INR:2 in the last 72 hours ABG No results found for this basename: PHART:2,PCO2:2,PO2:2,HCO3:2 in the last 72 hours  Studies/Results: No results found.  Anti-infectives: Anti-infectives     Start     Dose/Rate Route Frequency Ordered Stop   07/30/11 1200   cefTRIAXone (ROCEPHIN) 2 g in dextrose 5 % 50 mL IVPB     Comments: Pharmacy may adjust dosing strength / duration / interval for maximal efficacy      2 g 100 mL/hr over 30 Minutes Intravenous Every 24 hours 07/30/11 0425     07/30/11 0630   sodium chloride 0.9 % 1,000 mL with clindamycin (CLEOCIN) 900 mg, gentamicin (GARAMYCIN) 240 mg infusion        250 mL/hr over 1 Hours Intravenous Continuous 07/30/11 0629 07/30/11 0729   07/30/11 0530   metroNIDAZOLE (FLAGYL) IVPB 500 mg        500 mg 100 mL/hr over 60 Minutes Intravenous  Once 07/30/11 0529 07/30/11  0625   07/30/11 0430   metroNIDAZOLE (FLAGYL) IVPB 500 mg        500 mg 100 mL/hr over 60 Minutes Intravenous Every 6 hours 07/30/11 0425     07/30/11 0345   Ampicillin-Sulbactam (UNASYN) 3 g in sodium chloride 0.9 % 100 mL IVPB        3 g 100 mL/hr over 60 Minutes Intravenous  Once 07/30/11 0331 07/30/11 0443          Assessment/Plan: s/p Procedure(s) (LRB): LAPAROSCOPIC LYSIS OF ADHESIONS (N/A) INCISION AND DRAINAGE ABSCESS (N/A) LAPAROSCOPIC SIGMOID COLECTOMY (N/A) COLOSTOMY (Left)  Keep on clear liquids today, ambulate in halls with physical therapy, possibly advance diet later today.  LOS: 7 days    [name], [name] 08/05/2011

## 2011-08-05 NOTE — Progress Notes (Signed)
Clinical Social Work  CSW met with patient at bedside to give bed offers. Patient reported that she wanted a SNF near her home. [name] had offered a private room and therefore patient reported she would prefer to go to that SNF. CSW called [name] who is agreeable to admission to a private room when patient is medically dc from the hospital. CSW will continue to follow and will keep SNF updated on plans.  [place], [place] [phone]

## 2011-08-05 NOTE — Progress Notes (Signed)
I have seen and examined the patient and agree with the assessment and plans.  [name] [name] A. [name] Kimble  MD, FACS  

## 2011-08-06 MED ORDER — BUPROPION HCL 100 MG PO TABS
100.0000 mg | ORAL_TABLET | Freq: Three times a day (TID) | ORAL | Status: DC
  Administered 2011-08-06 – 2011-08-11 (×15): 100 mg via ORAL
  Filled 2011-08-06 (×17): qty 1

## 2011-08-06 NOTE — Progress Notes (Signed)
Occupational Therapy Treatment Patient Details Name: [name] Bell MRN: [id number] DOB: [birth year] Today's Date: 08/06/2011 Time: 4098-1191 OT Time Calculation (min): 34 min  OT Assessment / Plan / Recommendation Comments on Treatment Session Pt doing well but still nauseas and with pain limiting speed and overall independence with basic selfcare tasks.  Reports feeling deperessed at this time and would like to resume taking her medications for this at home.  Nursing made aware of her situation.  Recommend continued OT to work toward [employer] independent goals.    Follow Up Recommendations  Skilled nursing facility       Equipment Recommendations  Defer to next venue       Frequency Min 2X/week   Plan Discharge plan remains appropriate    Precautions / Restrictions Precautions Precautions: Fall Restrictions Weight Bearing Restrictions: No   Pertinent Vitals/Pain Pain 6/10 during session.  O2 sats 93-94% on room air    ADL  Grooming: Performed;Wash/dry hands;Wash/dry face;Supervision/safety;Brushing hair Where Assessed - Grooming: Supported standing Toilet Transfer: Research scientist (life sciences) Method: Other (comment) (ambulating with RW.) Toilet Transfer Equipment: Comfort height toilet;Grab bars Toileting - Clothing Manipulation and Hygiene: Performed;Supervision/safety Where Assessed - Toileting Clothing Manipulation and Hygiene: Sit on 3-in-1 or toilet Transfers/Ambulation Related to ADLs: Pt close supervision for mobility using the RW. ADL Comments: Pt doing well able to tolerate 20 mins of continuous standing and mobility with the RW.  Reports that she feels a little depressed based on the current situation and hasn't been receiveing her meds since she has been in the hospital.  Nursing made aware of this.  Still limited by pain and nausea at times.       OT Goals ADL Goals ADL Goal: Grooming - Progress: Progressing toward goals ADL Goal: Toilet  Transfer - Progress: Progressing toward goals ADL Goal: Toileting - Clothing Manipulation - Progress: Progressing toward goals ADL Goal: Toileting - Hygiene - Progress: Progressing toward goals  Visit Information  Last OT Received On: 08/06/11 Assistance Needed: +1    Subjective Data  Subjective: "I've been depressed and they haven't given me my medicine yet." Patient Stated Goal: To go to the bathroom      Cognition  Overall Cognitive Status: Appears within functional limits for tasks assessed/performed Arousal/Alertness: Awake/alert Orientation Level: Appears intact for tasks assessed Behavior During Session: [facility] for tasks performed Current Attention Level: Sustained Cognition - Other Comments: Improved from eval    Mobility Bed Mobility Bed Mobility: Right Sidelying to Sit;Rolling Right Rolling Right: 5: Supervision;With rail Right Sidelying to Sit: 5: Supervision;HOB elevated;With rails Details for Bed Mobility Assistance: not observed, pt standing in room with OT upon my arrival Transfers Transfers: Sit to Stand Sit to Stand: 5: Supervision;With upper extremity assist Stand to Sit: 5: Supervision;Without upper extremity assist;To toilet Details for Transfer Assistance: cues for safe technique and hand placement      Balance Static Standing Balance Static Standing - Balance Support: No upper extremity supported Static Standing - Level of Assistance: 5: Stand by assistance Dynamic Standing Balance Dynamic Standing - Balance Support: Right upper extremity supported;Left upper extremity supported Dynamic Standing - Level of Assistance: 5: Stand by assistance Dynamic Standing - Comments: with use of RW for support  End of Session OT - End of Session Equipment Utilized During Treatment: Gait belt Activity Tolerance: Patient limited by pain;Patient limited by fatigue Patient left: in chair;with call bell/phone within reach     [facility] OTR/L 08/06/2011, 4:34 PM Pager  number [phone]

## 2011-08-06 NOTE — Progress Notes (Signed)
Patient ID: [name] Bell, female   DOB: [birth year], 66 y.o.   MRN: [id number] Patient ID: [name] Bell, female   DOB: 03/26/1945, 66 y.o.   MRN: [id number] 7 Days Post-Op  Subjective: Feels much better today, denies nausea/vomiting, abd sore, passing gas through ostomy.  Objective: Vital signs in last 24 hours: Temp:  [97.9 F (36.6 C)-98.8 F (37.1 C)] 97.9 F (36.6 C) (07/11 0455) Pulse Rate:  [84-103] 94  (07/11 0455) Resp:  [18-20] 18  (07/11 0455) BP: (121-136)/(47-75) 121/54 mmHg (07/11 0455) SpO2:  [94 %-100 %] 96 % (07/11 0758) Last BM Date: 07/28/11 (PTA. Pt has colostomy.)  Intake/Output from previous day: 07/10 0701 - 07/11 0700 In: 2443.5 [P.O.:840; I.V.:1243.5; IV Piggyback:360] Out: 2385 [Urine:2275; Drains:110] Intake/Output this shift:   Abdomen soft, non distended Ostomy pink without output, gas in bag Incision c/d/i [employer] Results:  No results found for this basename: WBC:2,HGB:2,HCT:2,PLT:2 in the last 72 hours BMET  Basename 08/04/11 0447 08/03/11 1120  NA 143 137  K 3.5 3.1*  CL 103 99  CO2 33* 31  GLUCOSE 110* 132*  BUN <3* <3*  CREATININE 0.44* 0.43*  CALCIUM 8.1* 8.0*   PT/INR No results found for this basename: LABPROT:2,INR:2 in the last 72 hours ABG No results found for this basename: PHART:2,PCO2:2,PO2:2,HCO3:2 in the last 72 hours  Studies/Results: No results found.  Anti-infectives: Anti-infectives     Start     Dose/Rate Route Frequency Ordered Stop   07/30/11 1200   cefTRIAXone (ROCEPHIN) 2 g in dextrose 5 % 50 mL IVPB     Comments: Pharmacy may adjust dosing strength / duration / interval for maximal efficacy      2 g 100 mL/hr over 30 Minutes Intravenous Every 24 hours 07/30/11 0425     07/30/11 0630   sodium chloride 0.9 % 1,000 mL with clindamycin (CLEOCIN) 900 mg, gentamicin (GARAMYCIN) 240 mg infusion        250 mL/hr over 1 Hours Intravenous Continuous 07/30/11 0629 07/30/11 0729   07/30/11 0530    metroNIDAZOLE (FLAGYL) IVPB 500 mg        500 mg 100 mL/hr over 60 Minutes Intravenous  Once 07/30/11 0529 07/30/11 0625   07/30/11 0430   metroNIDAZOLE (FLAGYL) IVPB 500 mg        500 mg 100 mL/hr over 60 Minutes Intravenous Every 6 hours 07/30/11 0425     07/30/11 0345   Ampicillin-Sulbactam (UNASYN) 3 g in sodium chloride 0.9 % 100 mL IVPB        3 g 100 mL/hr over 60 Minutes Intravenous  Once 07/30/11 0331 07/30/11 0443          Assessment/Plan: s/p Procedure(s) (LRB): LAPAROSCOPIC LYSIS OF ADHESIONS (N/A) INCISION AND DRAINAGE ABSCESS (N/A) LAPAROSCOPIC SIGMOID COLECTOMY (N/A) COLOSTOMY (Left):  Advance diet to full liquids, keep mobilizing, working on rehab placement also.  No other changes today.  .  LOS: 8 days    [name] Bell 08/06/2011

## 2011-08-06 NOTE — Progress Notes (Signed)
Physical Therapy Treatment Patient Details Name: [name] Bell MRN: [id number] DOB: [birth year] Today's Date: 08/06/2011 Time: 4098-1191 PT Time Calculation (min): 16 min  PT Assessment / Plan / Recommendation Comments on Treatment Session  [name] Bell is 66 y/o female s/p colostomy. Progressing well with therapy limited mostly by pain and nausea. Continue to rec SNF as pt lives alone and will have difficulty with ADLs and general mobility.     Follow Up Recommendations  Skilled nursing facility    Barriers to Discharge        Equipment Recommendations  Defer to next venue    Recommendations for Other Services    Frequency     Plan Discharge plan remains appropriate;Frequency remains appropriate    Precautions / Restrictions Precautions Precautions: Fall   Pertinent Vitals/Pain Abdominal pain, ice applied. Nauseous throughout session    Mobility  Bed Mobility Details for Bed Mobility Assistance: not observed, pt standing in room with OT upon my arrival Transfers Sit to Stand:  (not observed pt up walking in room with OT) Stand to Sit: 4: Min guard;With upper extremity assist;With armrests;To chair/3-in-1 Details for Transfer Assistance: cues for safe technique and hand placement Ambulation/Gait Ambulation/Gait Assistance: 5: Supervision;4: Min guard Ambulation Distance (Feet): 50 Feet Assistive device: Rolling walker Ambulation/Gait Assistance Details: very slow cautious gait with decreased step length and height, cues for upright posture and safe distance/positioning with RW Gait Pattern: Decreased trunk rotation    Exercises      PT Goals Acute Rehab PT Goals PT Goal: Stand to Sit - Progress: Progressing toward goal PT Goal: Ambulate - Progress: Progressing toward goal  Visit Information  Last PT Received On: 08/06/11 Assistance Needed: +1    Subjective Data  Subjective: I am just so hot and sweaty.    Cognition  Overall Cognitive Status: Appears within  functional limits for tasks assessed/performed Arousal/Alertness: Awake/alert Cognition - Other Comments: Improved from eval    Balance  Dynamic Standing Balance Dynamic Standing - Balance Support: No upper extremity supported Dynamic Standing - Level of Assistance: 4: Min assist Dynamic Standing - Comments: pt performed 180 degree turn transitioning from one walker to another, very slow and stiff movement  End of Session PT - End of Session Equipment Utilized During Treatment: Gait belt Activity Tolerance: Treatment limited secondary to medical complications (Comment) (pt nauseous and hot (visibly sweating)) Patient left: in chair;with call bell/phone within reach Nurse Communication: Mobility status   [name] Bell 08/06/2011, 4:08 PM

## 2011-08-06 NOTE — Progress Notes (Signed)
Clinical Social Work  Per chart review, patient not ready to dc. CSW spoke with [facility] (SNF) and updated facility on patient's dc plans. SNF is agreeable to admission when patient is medically ready to dc from the hospital. CSW will continue to follow.  [place], [place] [phone]

## 2011-08-06 NOTE — Progress Notes (Signed)
Pt discussed in hospital LLOS meeting today.  

## 2011-08-06 NOTE — Progress Notes (Signed)
I have seen and examined the patient and agree with the assessment and plans.  Anajulia Leyendecker A. Bradie Sangiovanni  MD, FACS  

## 2011-08-07 NOTE — Progress Notes (Signed)
UR complete 

## 2011-08-07 NOTE — Progress Notes (Signed)
Physical Therapy Treatment Patient Details Name: [name] Bell MRN: [id number] DOB: 22-Apr-1945 Today's Date: 08/07/2011 Time: 7829-5621 PT Time Calculation (min): 29 min  PT Assessment / Plan / Recommendation Comments on Treatment Session  Progressing well. Began working on getting out of flat bed today with rolling technique.     Follow Up Recommendations  Skilled nursing facility    Barriers to Discharge        Equipment Recommendations  Defer to next venue    Recommendations for Other Services    Frequency Min 3X/week   Plan Discharge plan remains appropriate;Frequency remains appropriate    Precautions / Restrictions Precautions Precautions: Fall    Mobility  Bed Mobility Bed Mobility: Rolling Right;Rolling Left Rolling Right: 5: Supervision Rolling Left: 5: Supervision Right Sidelying to Sit: HOB flat;4: Min assist Details for Bed Mobility Assistance: cues for rolling to decrease abdominal pressure/pain, minA facilitation for sequencing to bring trunk upright from sidelying position Transfers Transfers: Sit to Stand;Stand to Sit Sit to Stand: 5: Supervision;From toilet;From bed Stand to Sit: 5: Supervision;To toilet;To chair/3-in-1 Details for Transfer Assistance: cues for safe hand placement and positioning in prep to sit Ambulation/Gait Ambulation/Gait Assistance: 5: Supervision Ambulation Distance (Feet): 100 Feet Assistive device: Rolling walker Ambulation/Gait Assistance Details: cues for upright posture and safe use of RW especially during turns Gait Pattern: Trunk flexed;Decreased step length - right;Decreased step length - left;Decreased trunk rotation;Shuffle    Exercises      PT Goals Acute Rehab PT Goals PT Goal: Supine/Side to Sit - Progress: Progressing toward goal PT Goal: Sit to Supine/Side - Progress: Progressing toward goal PT Goal: Sit to Stand - Progress: Progressing toward goal PT Goal: Stand to Sit - Progress: Progressing toward goal PT  Goal: Ambulate - Progress: Progressing toward goal  Visit Information  Last PT Received On: 08/07/11 Assistance Needed: +1    Subjective Data  Subjective: I actually feel ok.    Cognition  Overall Cognitive Status: Appears within functional limits for tasks assessed/performed Arousal/Alertness: Awake/alert Orientation Level: Appears intact for tasks assessed Behavior During Session: [facility] for tasks performed    Balance  Static Standing Balance Static Standing - Balance Support: No upper extremity supported Static Standing - Level of Assistance: 5: Stand by assistance Static Standing - Comment/# of Minutes: washing hands, occasionally needs one upper extremity supported secondary to pain but otherwise no upper extremity support needed  End of Session PT - End of Session Equipment Utilized During Treatment: Gait belt Activity Tolerance: Patient tolerated treatment well Patient left: in chair;with call bell/phone within reach   GP     [facility] HELEN 08/07/2011, 8:55 AM

## 2011-08-07 NOTE — Progress Notes (Signed)
8 Days Post-Op  Subjective: Baseline nervous but otherwise ok Nothing from ostomy yet  Objective: Vital signs in last 24 hours: Temp:  [98.5 F (36.9 C)-98.7 F (37.1 C)] 98.5 F (36.9 C) (07/12 0546) Pulse Rate:  [68-105] 101  (07/12 0546) Resp:  [18-20] 20  (07/12 0546) BP: (116-128)/(53-58) 116/58 mmHg (07/12 0546) SpO2:  [94 %-95 %] 94 % (07/12 0546) Last BM Date: 07/28/11 (PTA. Pt has colostomy.)  Intake/Output from previous day: 07/11 0701 - 07/12 0700 In: 2073 [P.O.:1200; I.V.:773; IV Piggyback:100] Out: 1225 [Urine:1100; Drains:125] Intake/Output this shift:    Abdomen soft, non distended, drain serosang Incision clean  Lab Results:  No results found for this basename: WBC:2,HGB:2,HCT:2,PLT:2 in the last 72 hours BMET No results found for this basename: NA:2,K:2,CL:2,CO2:2,GLUCOSE:2,BUN:2,CREATININE:2,CALCIUM:2 in the last 72 hours PT/INR No results found for this basename: LABPROT:2,INR:2 in the last 72 hours ABG No results found for this basename: PHART:2,PCO2:2,PO2:2,HCO3:2 in the last 72 hours  Studies/Results: No results found.  Anti-infectives: Anti-infectives     Start     Dose/Rate Route Frequency Ordered Stop   07/30/11 1200   cefTRIAXone (ROCEPHIN) 2 g in dextrose 5 % 50 mL IVPB     Comments: Pharmacy may adjust dosing strength / duration / interval for maximal efficacy      2 g 100 mL/hr over 30 Minutes Intravenous Every 24 hours 07/30/11 0425     07/30/11 0630   sodium chloride 0.9 % 1,000 mL with clindamycin (CLEOCIN) 900 mg, gentamicin (GARAMYCIN) 240 mg infusion        250 mL/hr over 1 Hours Intravenous Continuous 07/30/11 0629 07/30/11 0729   07/30/11 0530   metroNIDAZOLE (FLAGYL) IVPB 500 mg        500 mg 100 mL/hr over 60 Minutes Intravenous  Once 07/30/11 0529 07/30/11 0625   07/30/11 0430   metroNIDAZOLE (FLAGYL) IVPB 500 mg        500 mg 100 mL/hr over 60 Minutes Intravenous Every 6 hours 07/30/11 0425     07/30/11 0345    Ampicillin-Sulbactam (UNASYN) 3 g in sodium chloride 0.9 % 100 mL IVPB        3 g 100 mL/hr over 60 Minutes Intravenous  Once 07/30/11 0331 07/30/11 0443          Assessment/Plan: s/p Procedure(s) (LRB): LAPAROSCOPIC LYSIS OF ADHESIONS (N/A) INCISION AND DRAINAGE ABSCESS (N/A) LAPAROSCOPIC SIGMOID COLECTOMY (N/A) COLOSTOMY (Left)  Keep on fulls Continue PT/OT Drain out soon  LOS: 9 days    [name] Bell A 08/07/2011

## 2011-08-07 NOTE — Progress Notes (Signed)
PT AND FRIEND WATCHED OSTOMY VIDEO.

## 2011-08-07 NOTE — Progress Notes (Signed)
Clinical Social Worker reviewed chart and noted that pt not yet medically ready for discharge. Clinical Social Worker updated [employer] and [address]. Clinical Social Worker to facilitate pt discharge needs when pt medically stable for discharge.  Jacklynn Lewis, MSW, LCSWA  Clinical Social Work [phone]

## 2011-08-07 NOTE — Consult Note (Signed)
WOC follow up Stoma type/location: LLQ, end colostomy Stomal assessment/size: 1 3/4" round Peristomal assessment: intact Output flatus only Ostomy pouching 2pc 2 1/4" in place, no output.  Pt to dc to SNF for short term rehab. Would suggest HHRN after dc from SNF for further assistance. Will plan another pouch change Monday with pt for pt to demonstrate more independence.  Supplies in the room should the bedside nurse need them  WOC will follow along with you for management and teaching for ostomy care.  Will ask nursing staff to have pt watch ostomy video Saori Umholtz Marlena Clipper, [employer] [phone]

## 2011-08-08 MED ORDER — PANTOPRAZOLE SODIUM 40 MG PO TBEC
40.0000 mg | DELAYED_RELEASE_TABLET | Freq: Two times a day (BID) | ORAL | Status: DC
  Administered 2011-08-08 – 2011-08-11 (×6): 40 mg via ORAL
  Filled 2011-08-08 (×6): qty 1

## 2011-08-08 NOTE — Progress Notes (Signed)
Patient ID: [name] Bell, female   DOB: 1945/04/08, 66 y.o.   MRN: [id number]  General Surgery - [facility] - Progress Note  POD# 8  Subjective: Patient without complaints.  Has not ambulated much.  Complains of back pain and gas pains.  Taking full liquid diet.  Objective: Vital signs in last 24 hours: Temp:  [98 F (36.7 C)-98.7 F (37.1 C)] 98 F (36.7 C) (07/13 0644) Pulse Rate:  [97-106] 102  (07/13 0644) Resp:  [18-20] 18  (07/13 0644) BP: (115-133)/(50-56) 133/52 mmHg (07/13 0644) SpO2:  [92 %-99 %] 97 % (07/13 0732) Last BM Date: 07/28/11 (PTA. Pt has colostomy.)  Intake/Output from previous day: 07/12 0701 - 07/13 0700 In: 480 [P.O.:480] Out: 340 [Urine:250; Drains:90]  Exam: HEENT - clear, not icteric Neck - soft Chest - clear bilaterally Cor - RRR, no murmur Abd - soft without distension; BS present; stoma viable in left mid abdomen; drain with serous output Ext - no significant edema Neuro - grossly intact, no focal deficits  Lab Results:  No results found for this basename: WBC:2,HGB:2,HCT:2,PLT:2 in the last 72 hours  No results found for this basename: NA:2,K:2,CL:2,CO2:2,GLUCOSE:2,BUN:2,CREATININE:2,CALCIUM:2 in the last 72 hours  Studies/Results: No results found.  Assessment / Plan: 1.  Status post Hartmann's resection for perforation  - patient wishes to continue full liquids today - may advance tomorrow  - needs to get OOB, ambulate in halls  - instruct in stoma care  - pain Rx mainly for back pain at this point  Velora Heckler, MD, [facility] Office: [phone]  08/08/2011

## 2011-08-09 NOTE — Progress Notes (Signed)
Patient ID: [name] Bell, female   DOB: 1945/12/03, 66 y.o.   MRN: [id number]  General Surgery - [facility] - Progress Note  POD# 9  Subjective: Patient doing well.  Ambulated in halls.  Taking full liquids - wants regular diet.  Objective: Vital signs in last 24 hours: Temp:  [98.1 F (36.7 C)-98.5 F (36.9 C)] 98.1 F (36.7 C) (07/14 0552) Pulse Rate:  [92-101] 100  (07/14 0552) Resp:  [18-20] 20  (07/14 0552) BP: (109-131)/(45-60) 109/49 mmHg (07/14 0552) SpO2:  [93 %-98 %] 93 % (07/14 0836) Last BM Date: 07/28/11 (PTA. Pt has colostomy.)  Intake/Output from previous day: 07/13 0701 - 07/14 0700 In: 500 [P.O.:300; I.V.:200] Out: 150 [Drains:50; Stool:100]  Exam: HEENT - clear, not icteric Neck - soft Chest - clear bilaterally Cor - RRR, no murmur Abd - soft without distension; stool in ostomy bag; BS present; wounds clear and dry; JP drain with serous Ext - no significant edema Neuro - grossly intact, no focal deficits  Lab Results:  No results found for this basename: WBC:2,HGB:2,HCT:2,PLT:2 in the last 72 hours  No results found for this basename: NA:2,K:2,CL:2,CO2:2,GLUCOSE:2,BUN:2,CREATININE:2,CALCIUM:2 in the last 72 hours  Studies/Results: No results found.  Assessment / Plan: 1.  Colonic perforation  - advance to regular diet  - discontinue IV abx  - drain out tomorrow  - instruct in stoma care 2.  Disposition  - patient expecting to go to Rehab  - Case Mgr and SW to address tomorrow  - stoma care instruction / HHN to be arranged  Velora Heckler, MD, [facility] Office: [phone]  08/09/2011

## 2011-08-10 LAB — CBC
Hemoglobin: 11.9 g/dL — ABNORMAL LOW (ref 12.0–15.0)
MCH: 31.2 pg (ref 26.0–34.0)
MCV: 96.9 fL (ref 78.0–100.0)
RBC: 3.82 MIL/uL — ABNORMAL LOW (ref 3.87–5.11)

## 2011-08-10 MED ORDER — IPRATROPIUM-ALBUTEROL 18-103 MCG/ACT IN AERO: 2.0000 | INHALATION_SPRAY | RESPIRATORY_TRACT | Status: DC | PRN

## 2011-08-10 NOTE — Progress Notes (Signed)
Occupational Therapy Treatment Patient Details Name: [name] Bell MRN: [id number] DOB: 13-Apr-1945 Today's Date: 08/10/2011 Time: 2725-3664 OT Time Calculation (min): 20 min  OT Assessment / Plan / Recommendation Comments on Treatment Session Pt doing much better this session physically and cognitively. Recommend continued OT to work towards mod I goals. D/c plan still for st snf since pt lives alone.    Follow Up Recommendations  Skilled nursing facility    Barriers to Discharge       Equipment Recommendations  Defer to next venue    Recommendations for Other Services    Frequency Min 2X/week   Plan Discharge plan remains appropriate    Precautions / Restrictions Precautions Precautions: Fall   Pertinent Vitals/Pain O2 saturation 94% on RA following light ADL activity.    ADL  Grooming: Performed;Wash/dry hands;Supervision/safety;Brushing hair Where Assessed - Grooming: Unsupported standing Toilet Transfer: Research scientist (life sciences) Method: Sit to Barista: Regular height toilet;Grab bars Toileting - Clothing Manipulation and Hygiene: Performed;Supervision/safety Where Assessed - Engineer, mining and Hygiene: Sit on 3-in-1 or toilet Equipment Used: Rolling walker Transfers/Ambulation Related to ADLs: Pt ambulated to the bathroom with supervision. Pt extemely talkative and has difficulty focusing on task at hand.     OT Diagnosis:    OT Problem List:   OT Treatment Interventions:     OT Goals ADL Goals ADL Goal: Grooming - Progress: Progressing toward goals ADL Goal: Toilet Transfer - Progress: Progressing toward goals ADL Goal: Toileting - Clothing Manipulation - Progress: Progressing toward goals ADL Goal: Toileting - Hygiene - Progress: Progressing toward goals  Visit Information  Last OT Received On: 08/10/11 Assistance Needed: +1    Subjective Data  Subjective: I just need to make sure my dog is going  to be ok while Im at rehab.   Prior Functioning       Cognition  Overall Cognitive Status: No family/caregiver present to determine baseline cognitive functioning Arousal/Alertness: Awake/alert Orientation Level: Oriented X4 / Intact Behavior During Session: [facility] for tasks performed Safety/Judgement: Decreased safety judgement for tasks assessed Cognition - Other Comments: Pt highly distractible, tangential but easily re-directed.    Mobility Bed Mobility Rolling Right: 6: Modified independent (Device/Increase time) Right Sidelying to Sit: 6: Modified independent (Device/Increase time) Transfers Sit to Stand: 5: Supervision;From bed;From toilet;With upper extremity assist Stand to Sit: 5: Supervision;With upper extremity assist;To chair/3-in-1;To toilet Details for Transfer Assistance: Pt demo'd good technique this session needing only 1 VC for hand placement when standing.   Exercises    Balance Static Standing Balance Static Standing - Balance Support: No upper extremity supported;During functional activity Static Standing - Level of Assistance: 5: Stand by assistance  End of Session OT - End of Session Activity Tolerance: Patient tolerated treatment well Patient left: in chair;with call bell/phone within reach  GO     Carlo Guevarra A OTR/L [phone] 08/10/2011, 12:00 PM

## 2011-08-10 NOTE — Progress Notes (Signed)
Agree with above 

## 2011-08-10 NOTE — Progress Notes (Signed)
Clinical Social Work  CSW reviewed chart which stated patient is not ready to dc today but possibly in the next day. CSW called SNF [facility]) and spoke with representative Bjorn Loser) who stated they could still accept patient when she is medically dc from hospital. CSW will continue to follow.  [place], [place] [phone]

## 2011-08-10 NOTE — Progress Notes (Signed)
Patient ID: [name] Bell, female   DOB: 09-Oct-1945, 66 y.o.   MRN: [id number] Patient ID: [name] Bell, female   DOB: 09-Nov-1945, 66 y.o.   MRN: [id number]   POD# 9  Subjective: Patient doing well. On regular diet, OT in to see patient, JP drain d/c'd.   Vital signs in last 24 hours: Temp:  [97.8 F (36.6 C)-98.3 F (36.8 C)] 98.3 F (36.8 C) (07/15 0630) Pulse Rate:  [95-105] 105  (07/15 0630) Resp:  [18] 18  (07/15 0630) BP: (120-129)/(47-69) 120/69 mmHg (07/15 0630) SpO2:  [94 %-98 %] 97 % (07/15 0739) Last BM Date: 07/28/11 (PTA. Pt has colostomy.)  Intake/Output from previous day: 07/14 0701 - 07/15 0700 In: 1920 [P.O.:1920] Out: 40 [Drains:40]  Exam: Chest - clear bilaterally Cardiac - RRR, no murmur Abd - soft without distension; stool in ostomy bag; BS present; wounds clear and dry; JP drain with serous drainage (25 ml in JP )- no edema, no erythema.  Lab Results:  No results found for this basename: WBC:2,HGB:2,HCT:2,PLT:2 in the last 72 hours  No results found for this basename: NA:2,K:2,CL:2,CO2:2,GLUCOSE:2,BUN:2,CREATININE:2,CALCIUM:2 in the last 72 hours  Studies/Results: No results found.  Assessment / Plan: 1.  Colonic perforation 2. D/C JP 3. Recheck WBC today 4. Will need instruction on colostomy care (stoma care instruction / HHN to be arranged) 5. Patient expecting to go to Rehab     Case Mgr and SW to address  6. Anticipate discharge in next day or so.  Blenda Mounts ACNP 08/10/2011

## 2011-08-10 NOTE — Consult Note (Signed)
WOC follow up Stoma type/location: LLQ, colostomy Stomal assessment/size: 1 3/4" round, budded from skin Peristomal assessment: intact Treatment options for stomal/peristomal skin: none needed Output liquid brown in pouch at time of pouch change Ostomy pouching: /2pc.  2 1/4" pouch/wafer applied.  Suggested pt take pouch off and she requested to observe WOC complete pouch change and have her perform next change.  I have instructed her that her pouch needs to be changed 2x wk and she may dc to SNF before we have another opportunity to change again.  I will offer again tom to have her at least cut a new wafer.  She has also been encouraged to empty as well, she will need this skill regardless of being in rehab. Cut wafer at 1 3/4" round.   Education provided:  Instructed her that she may want to do pouch change in bathroom when she starts doing this and she agreed that would be best.  Instructed her on basis stoma care and she and her neighbor have viewed the ostomy educational video as well as she has reviewed her ostomy care booklet.    WOC will continue to follow for ostomy teaching Lulie Hurd [place], [place] [phone]

## 2011-08-11 DIAGNOSIS — R1084 Generalized abdominal pain: Secondary | ICD-10-CM | POA: Diagnosis not present

## 2011-08-11 DIAGNOSIS — Z5189 Encounter for other specified aftercare: Secondary | ICD-10-CM | POA: Diagnosis not present

## 2011-08-11 DIAGNOSIS — Z4801 Encounter for change or removal of surgical wound dressing: Secondary | ICD-10-CM | POA: Diagnosis not present

## 2011-08-11 DIAGNOSIS — M6281 Muscle weakness (generalized): Secondary | ICD-10-CM | POA: Diagnosis not present

## 2011-08-11 DIAGNOSIS — K5649 Other impaction of intestine: Secondary | ICD-10-CM | POA: Diagnosis not present

## 2011-08-11 DIAGNOSIS — Z433 Encounter for attention to colostomy: Secondary | ICD-10-CM | POA: Diagnosis not present

## 2011-08-11 DIAGNOSIS — R262 Difficulty in walking, not elsewhere classified: Secondary | ICD-10-CM | POA: Diagnosis not present

## 2011-08-11 DIAGNOSIS — H5440 Blindness, one eye, unspecified eye: Secondary | ICD-10-CM | POA: Diagnosis not present

## 2011-08-11 DIAGNOSIS — R10817 Generalized abdominal tenderness: Secondary | ICD-10-CM | POA: Diagnosis not present

## 2011-08-11 DIAGNOSIS — R21 Rash and other nonspecific skin eruption: Secondary | ICD-10-CM | POA: Diagnosis not present

## 2011-08-11 DIAGNOSIS — Z87891 Personal history of nicotine dependence: Secondary | ICD-10-CM | POA: Diagnosis not present

## 2011-08-11 DIAGNOSIS — R279 Unspecified lack of coordination: Secondary | ICD-10-CM | POA: Diagnosis not present

## 2011-08-11 DIAGNOSIS — J019 Acute sinusitis, unspecified: Secondary | ICD-10-CM | POA: Diagnosis not present

## 2011-08-11 DIAGNOSIS — R112 Nausea with vomiting, unspecified: Secondary | ICD-10-CM | POA: Diagnosis not present

## 2011-08-11 DIAGNOSIS — K659 Peritonitis, unspecified: Secondary | ICD-10-CM | POA: Diagnosis not present

## 2011-08-11 DIAGNOSIS — G894 Chronic pain syndrome: Secondary | ICD-10-CM | POA: Diagnosis not present

## 2011-08-11 DIAGNOSIS — R109 Unspecified abdominal pain: Secondary | ICD-10-CM | POA: Diagnosis not present

## 2011-08-11 DIAGNOSIS — Z933 Colostomy status: Secondary | ICD-10-CM | POA: Diagnosis not present

## 2011-08-11 DIAGNOSIS — F329 Major depressive disorder, single episode, unspecified: Secondary | ICD-10-CM | POA: Diagnosis not present

## 2011-08-11 DIAGNOSIS — K631 Perforation of intestine (nontraumatic): Secondary | ICD-10-CM | POA: Diagnosis not present

## 2011-08-11 DIAGNOSIS — K5900 Constipation, unspecified: Secondary | ICD-10-CM | POA: Diagnosis not present

## 2011-08-11 DIAGNOSIS — E669 Obesity, unspecified: Secondary | ICD-10-CM | POA: Diagnosis not present

## 2011-08-11 DIAGNOSIS — E785 Hyperlipidemia, unspecified: Secondary | ICD-10-CM | POA: Diagnosis not present

## 2011-08-11 DIAGNOSIS — M1990 Unspecified osteoarthritis, unspecified site: Secondary | ICD-10-CM | POA: Diagnosis not present

## 2011-08-11 MED ORDER — HYDROCODONE-ACETAMINOPHEN 5-325 MG PO TABS: 1.0000 | ORAL_TABLET | ORAL | Status: DC | PRN

## 2011-08-11 MED ORDER — PROMETHAZINE HCL 12.5 MG PO TABS: 25.0000 mg | ORAL_TABLET | Freq: Four times a day (QID) | ORAL | Status: DC | PRN

## 2011-08-11 MED ORDER — IPRATROPIUM-ALBUTEROL 18-103 MCG/ACT IN AERO: 2.0000 | INHALATION_SPRAY | RESPIRATORY_TRACT | Status: DC | PRN

## 2011-08-11 NOTE — Discharge Summary (Signed)
Physician Discharge Summary  Patient ID: ALPHA CHOUINARD MRN: [id number] DOB/AGE: February 10, 1945 66 y.o.  Admit date: 07/29/2011 Discharge date: 08/11/2011  Admitting Diagnosis: Abd pain, free air in abdomen   Discharge Diagnosis Patient Active Problem List   Diagnosis Date Noted  . Colon perforation, stercoral vs diverticular 07/30/2011  . Constipation 07/17/2010  . OTH&UNS SUP INJR OTH MX&UNS SITE W/O MENTION INF 02/25/2009  . HYPERLIPIDEMIA 01/24/2009  . Chronic pain syndrome 01/03/2009  . VISUAL ACUITY, DECREASED, LEFT EYE 01/03/2009  . DEPRESSION 03/08/2008  . CARCINOMA, BASAL CELL, CHEEK, LEFT 12/29/2007  . EXTERNAL HEMORRHOIDS 12/01/2007  . TEMPOROMANDIBULAR JOINT PAIN 12/01/2007  . COMPRESSION FRACTURE, THORACIC VERTEBRA 07/26/2007  . OSTEOPOROSIS 03/03/2007  . ACUTE DUODENAL ULCER W/HEMORRHAGE&OBSTRUCTION 12/28/2006  . TOBACCO ABUSE 12/02/2006  . VITAMIN B12 DEFICIENCY 11/03/2006    Consultants None   Procedures 1. Laparoscopic lysis of adhesions x30 minutes.  2. Laparoscopic drainage of pelvic abscesses.  3. Laparoscopically assisted rectosigmoid resection with end  colostomy.   Hospital Course: 65 yr old female with a history of a duodenal ulcer causing stricture status post dilation in 2008. Also with history of ischemic colitis with tortuous distal colon in 2009.   Had several day history of worsening abdominal pain. Right-sided initially then more in the lower abdomen. Became aggressively worse and more intense. Had felt constipated for several days. Worried that she was impacted. Worsened nausea and vomiting. She came to the emergency room. Initial Xray evaluation negative. Abdominal pain persisted. CT scan show evidence of some dots of free air and fluid down in the pelvis and less up to the right upper abdomen. Patient became tachycardic. Her Blood pressure became low and surgical consult was considered.  She underwent the procedure above and tolerated this well.   She was transferred to the ICU post op due to concerns of sepsis.  Over the next several days she improved nicely and was transferred to the floor.  Her bowel function began to return and her ostomy began to function.  Her diet was advanced.  OT/PT was consulted and she began to ambulate with them.  She was very deconditioned from her hospitalization and it was felt she would benefit from rehab for further recovery.  At time of discharge to rehab she was tolerating regular diet, ambulating with walker with PT, vitals stable, ostomy functioning well and felt ready for discharge.     Medication List  As of 08/11/2011 10:15 AM   STOP taking these medications         B-D 3CC LUER-LOK SYR 21GX1-1/2 21G X 1-1/2" 3 ML Misc      HYDROcodone-acetaminophen 7.5-750 MG per tablet      ondansetron 4 MG tablet         TAKE these medications         albuterol 108 (90 BASE) MCG/ACT inhaler   Commonly known as: PROVENTIL HFA;VENTOLIN HFA   Inhale 2 puffs into the lungs every 4 (four) hours as needed for wheezing.      albuterol-ipratropium 18-103 MCG/ACT inhaler   Commonly known as: COMBIVENT   Inhale 2 puffs into the lungs every 4 (four) hours as needed for wheezing.      buPROPion 100 MG tablet   Commonly known as: WELLBUTRIN   Take 1 tablet (100 mg total) by mouth 3 (three) times daily.      calcium-vitamin D 500-200 MG-UNIT per tablet   Commonly known as: OSCAL WITH D   Take 1 tablet by mouth  3 (three) times daily.      docusate sodium 100 MG capsule   Commonly known as: COLACE   Take 100 mg by mouth daily as needed. For shortness of breath      esomeprazole 40 MG capsule   Commonly known as: NEXIUM   Take 1 capsule (40 mg total) by mouth 2 (two) times daily.      HYDROcodone-acetaminophen 5-325 MG per tablet   Commonly known as: NORCO/VICODIN   Take 1-2 tablets by mouth every 4 (four) hours as needed.      loratadine-pseudoephedrine 5-120 MG per tablet   Commonly known as:  CLARITIN-D 12-hour   Take 1 tablet by mouth 2 (two) times daily.      pravastatin 80 MG tablet   Commonly known as: PRAVACHOL   Take 1 tablet (80 mg total) by mouth daily.             Follow-up Information    Follow up with GROSS,STEVEN C., MD. Schedule an appointment as soon as possible for a visit in 2 weeks.   Contact information:   [employer] [address] [place] [address] [phone]          Signed: Denny Levy [facility] [phone]  08/11/2011, 10:15 AM

## 2011-08-11 NOTE — Progress Notes (Signed)
Occupational Therapy Treatment Patient Details Name: [name] Bell MRN: [id number] DOB: 07-Mar-1945 Today's Date: 08/11/2011 Time: 5621-3086 OT Time Calculation (min): 30 min  OT Assessment / Plan / Recommendation Comments on Treatment Session Pt continuing to make steady progress towards goals. Many of pt's cognitive impairments appear to be resolving.     Follow Up Recommendations  Skilled nursing facility    Barriers to Discharge       Equipment Recommendations  Defer to next venue    Recommendations for Other Services    Frequency Min 2X/week   Plan Discharge plan remains appropriate    Precautions / Restrictions Precautions Precautions: Fall   Pertinent Vitals/Pain     ADL  Grooming: Performed;Wash/dry face;Wash/dry hands;Teeth care Where Assessed - Grooming: Unsupported standing Upper Body Bathing: Performed;Supervision/safety Where Assessed - Upper Body Bathing: Supported standing Lower Body Bathing: Performed;Minimal assistance Where Assessed - Lower Body Bathing: Supported sit to stand (Pt able to bathe legs, peri area but needs A w/ feet.) Upper Body Dressing: Performed;Supervision/safety Where Assessed - Upper Body Dressing: Supported standing Lower Body Dressing: Performed;Minimal assistance (pt able to don underwear but needs A with socks.) Where Assessed - Lower Body Dressing: Unsupported sitting;Supported sit to stand Toilet Transfer: Research scientist (life sciences) Method: Sit to Barista: Regular height toilet;Grab bars Toileting - Clothing Manipulation and Hygiene: Performed;Supervision/safety Where Assessed - Engineer, mining and Hygiene: Sit on 3-in-1 or toilet Equipment Used: Rolling walker Transfers/Ambulation Related to ADLs: Pt ambulated to the bathroom with supervision. Pt also gathered all the necessary towels and washcloths. ADL Comments: Pt was able to completed spongebath while standing at the  sink. Requires A for socks and bathing feet. Would benefit from the use of AE ed. Pt also much more focused this session and needed no cueing for re-direction.    OT Diagnosis:    OT Problem List:   OT Treatment Interventions:     OT Goals ADL Goals ADL Goal: Grooming - Progress: Progressing toward goals ADL Goal: Upper Body Bathing - Progress: Progressing toward goals ADL Goal: Lower Body Bathing - Progress: Progressing toward goals ADL Goal: Upper Body Dressing - Progress: Progressing toward goals ADL Goal: Lower Body Dressing - Progress: Progressing toward goals ADL Goal: Toilet Transfer - Progress: Progressing toward goals ADL Goal: Toileting - Clothing Manipulation - Progress: Progressing toward goals ADL Goal: Toileting - Hygiene - Progress: Progressing toward goals  Visit Information  Last OT Received On: 08/11/11 Assistance Needed: +1    Subjective Data  Subjective: I believe I'm getting better at all this.   Prior Functioning       Cognition  Overall Cognitive Status: Appears within functional limits for tasks assessed/performed Arousal/Alertness: Awake/alert Orientation Level: Appears intact for tasks assessed Behavior During Session: [facility] for tasks performed Current Attention Level: Selective    Mobility Transfers Sit to Stand: 5: Supervision;From bed;From toilet;With upper extremity assist Stand to Sit: 5: Supervision;With upper extremity assist;With armrests;To chair/3-in-1;To toilet Details for Transfer Assistance: Pt continuing to improve with safe manipulation of RW, requiring only 1 VC for hand placement.   Exercises    Balance Static Standing Balance Static Standing - Balance Support: No upper extremity supported;During functional activity Static Standing - Level of Assistance: 5: Stand by assistance Dynamic Standing Balance Dynamic Standing - Balance Support: Left upper extremity supported Dynamic Standing - Level of Assistance: 5: Stand by assistance    End of Session OT - End of Session Activity Tolerance: Patient tolerated treatment well Patient left:  in chair;with call bell/phone within reach  GO     Sanye Ledesma A OTR/L [phone] 08/11/2011, 10:53 AM

## 2011-08-11 NOTE — Progress Notes (Signed)
Clinical Social Work  CSW faxed [employer] summary to SNF [facility]). CSW prepared dc packet. SNF agreeable to admission today. CSW informed patient and RN of dc. CSW coordinated transportation via [facility] for 1130 pick up. CSW is signing off.  [place], [place] [phone]

## 2011-08-11 NOTE — Progress Notes (Signed)
Patient for discharge. Called [facility] but unavailable to contact at this time, Ostomy care instruction given to patient.

## 2011-08-11 NOTE — Progress Notes (Signed)
Discharge to [facility], transported by EMS.

## 2011-08-11 NOTE — Discharge Summary (Signed)
I think she is ready for discharge, she has some nausea at times but is tolerating diet having bowel function.

## 2011-08-17 ENCOUNTER — Emergency Department (HOSPITAL_COMMUNITY)
Admission: EM | Admit: 2011-08-17 | Discharge: 2011-08-17 | Disposition: A | Discharge status: Home / Self Care | Payer: Medicare Other | Attending: Emergency Medicine | Admitting: Emergency Medicine

## 2011-08-17 ENCOUNTER — Encounter (HOSPITAL_COMMUNITY): Payer: Self-pay | Admitting: *Deleted

## 2011-08-17 ENCOUNTER — Emergency Department (HOSPITAL_COMMUNITY): Payer: Medicare Other

## 2011-08-17 DIAGNOSIS — K631 Perforation of intestine (nontraumatic): Secondary | ICD-10-CM | POA: Diagnosis not present

## 2011-08-17 DIAGNOSIS — R112 Nausea with vomiting, unspecified: Secondary | ICD-10-CM | POA: Diagnosis not present

## 2011-08-17 DIAGNOSIS — K659 Peritonitis, unspecified: Secondary | ICD-10-CM | POA: Diagnosis not present

## 2011-08-17 DIAGNOSIS — Z933 Colostomy status: Secondary | ICD-10-CM | POA: Insufficient documentation

## 2011-08-17 DIAGNOSIS — R109 Unspecified abdominal pain: Secondary | ICD-10-CM | POA: Diagnosis not present

## 2011-08-17 DIAGNOSIS — R10817 Generalized abdominal tenderness: Secondary | ICD-10-CM | POA: Insufficient documentation

## 2011-08-17 HISTORY — DX: Perforation of intestine (nontraumatic): K63.1

## 2011-08-17 LAB — COMPREHENSIVE METABOLIC PANEL
ALT: 7 U/L (ref 0–35)
AST: 16 U/L (ref 0–37)
Albumin: 1.9 g/dL — ABNORMAL LOW (ref 3.5–5.2)
CO2: 30 mEq/L (ref 19–32)
Calcium: 8.3 mg/dL — ABNORMAL LOW (ref 8.4–10.5)
Sodium: 137 mEq/L (ref 135–145)
Total Protein: 5.7 g/dL — ABNORMAL LOW (ref 6.0–8.3)

## 2011-08-17 LAB — CBC WITH DIFFERENTIAL/PLATELET
Basophils Absolute: 0 10*3/uL (ref 0.0–0.1)
Basophils Relative: 0 % (ref 0–1)
Eosinophils Absolute: 0 10*3/uL (ref 0.0–0.7)
Eosinophils Relative: 0 % (ref 0–5)
Lymphocytes Relative: 19 % (ref 12–46)
MCHC: 33.3 g/dL (ref 30.0–36.0)
MCV: 93.7 fL (ref 78.0–100.0)
Platelets: 431 10*3/uL — ABNORMAL HIGH (ref 150–400)
RDW: 13.9 % (ref 11.5–15.5)
WBC: 10.1 10*3/uL (ref 4.0–10.5)

## 2011-08-17 LAB — URINE MICROSCOPIC-ADD ON

## 2011-08-17 LAB — URINALYSIS, ROUTINE W REFLEX MICROSCOPIC
Glucose, UA: NEGATIVE mg/dL
Hgb urine dipstick: NEGATIVE
Specific Gravity, Urine: 1.016 (ref 1.005–1.030)
pH: 6.5 (ref 5.0–8.0)

## 2011-08-17 LAB — LACTIC ACID, PLASMA: Lactic Acid, Venous: 1.3 mmol/L (ref 0.5–2.2)

## 2011-08-17 MED ORDER — ONDANSETRON HCL 4 MG/2ML IJ SOLN
4.0000 mg | Freq: Once | INTRAMUSCULAR | Status: AC
  Administered 2011-08-17: 4 mg via INTRAVENOUS
  Filled 2011-08-17: qty 2

## 2011-08-17 MED ORDER — HYDROMORPHONE HCL PF 1 MG/ML IJ SOLN
1.0000 mg | Freq: Once | INTRAMUSCULAR | Status: AC
  Administered 2011-08-17: 1 mg via INTRAVENOUS
  Filled 2011-08-17 (×2): qty 1

## 2011-08-17 MED ORDER — SODIUM CHLORIDE 0.9 % IV BOLUS (SEPSIS)
1000.0000 mL | Freq: Once | INTRAVENOUS | Status: AC
  Administered 2011-08-17: 1000 mL via INTRAVENOUS

## 2011-08-17 MED ORDER — HYDROMORPHONE HCL PF 1 MG/ML IJ SOLN
1.0000 mg | Freq: Once | INTRAMUSCULAR | Status: AC
  Administered 2011-08-17: 1 mg via INTRAVENOUS
  Filled 2011-08-17: qty 1

## 2011-08-17 MED ORDER — IOHEXOL 300 MG/ML  SOLN
80.0000 mL | Freq: Once | INTRAMUSCULAR | Status: AC | PRN
  Administered 2011-08-17: 80 mL via INTRAVENOUS

## 2011-08-17 MED ORDER — POTASSIUM CHLORIDE CRYS ER 20 MEQ PO TBCR
60.0000 meq | EXTENDED_RELEASE_TABLET | Freq: Once | ORAL | Status: AC
  Administered 2011-08-17: 60 meq via ORAL
  Filled 2011-08-17: qty 3

## 2011-08-17 NOTE — ED Notes (Signed)
Pt to CT  Pt alert and oriented x4. Respirations even and unlabored, bilateral symmetrical rise and fall of chest. Skin warm and dry. In no acute distress. Denies needs.   

## 2011-08-17 NOTE — ED Notes (Signed)
Patient given discharge instructions, information, prescriptions, and diet order. Patient states that they adequately understand discharge information given and to return to ED if symptoms return or worsen.    Pt RN called [facility] for transport back to nursing facility.

## 2011-08-17 NOTE — ED Provider Notes (Signed)
History     CSN: [id number]  Arrival date & time 08/17/11  1502   First MD Initiated Contact with Patient 08/17/11 1516      Chief Complaint  Patient presents with  . Abdominal Pain     HPI The patient presents 19 days after colectomy with colostomy placement, now with abdominal pain.  She notes that Immediately following the procedure she was unremarkable.  3 days ago she began to have abdominal pain.  Since onset the pain has worsened, is sharp, now diffuse with radiation to the left shoulder.  She notes concurrent anorexia, fever, nausea, vomiting.  If these are increasing in spite of attempts with OTC medication for control.  She notes that over this time.  She has scant stool production in her colostomy. Past Medical History  Diagnosis Date  . Superior mesenteric artery syndrome 11/08    sp dilation by Dr. Ewing Schlein, Pt may require bowel resetion if sx recur  . Duodenal ulcer 11/08    With hemorrhage and obstruction  . Osteomyelitis, jaw acute 11/08    Started while in the hospital abcess showed GNR . SP debridement by Dr. Neoma Laming,  Ernesta Amble S Bovis sp 4  weeks of Pen V started on 05/19/07  with additional 2 weeks in 07/04/07  . Degenerative disk disease     This is interscapular, mild compression frx of T12 superior endplate with Schmorl's node. Seen on CT in 2/08  . Back pain   . Depression   . Acute sinusitis 06/30/2011  . Ischemic colitis 07/30/2011    2009 by endoscopy   . Perforated bowel     surgery july 2013    Past Surgical History  Procedure Date  . Abdominal hysterectomy   . Colostomy 07/30/2011    Procedure: COLOSTOMY;  Surgeon: Ardeth Sportsman, MD;  Location: [facility] OR;  Service: General;  Laterality: Left;    Family History  Problem Relation Age of Onset  . Cancer Mother   . Heart disease Father   . Diabetes Sister     History  Substance Use Topics  . Smoking status: Current Some Day Smoker -- 2.0 packs/day for 20 years    Types: Cigarettes  . Smokeless tobacco:  Not on file  . Alcohol Use: No    OB History    Grav Para Term Preterm Abortions TAB SAB Ect Mult Living                  Review of Systems  Constitutional:       HPI  HENT:       HPI otherwise negative  Eyes: Negative.   Respiratory:       HPI, otherwise negative  Cardiovascular:       HPI, otherwise nmegative  Gastrointestinal: Positive for nausea, vomiting and abdominal pain. Negative for blood in stool, abdominal distention and anal bleeding.  Genitourinary:       HPI, otherwise negative  Musculoskeletal:       HPI, otherwise negative  Skin: Negative.   Neurological: Negative for syncope.    Allergies  Ibuprofen; Latex; and Nsaids  Home Medications   Current Outpatient Rx  Name Route Sig Dispense Refill  . BUPROPION HCL 100 MG PO TABS Oral Take 1 tablet (100 mg total) by mouth 3 (three) times daily. 90 tablet 3  . CALCIUM CARBONATE-VITAMIN D 500-200 MG-UNIT PO TABS Oral Take 1 tablet by mouth 3 (three) times daily.      Marland Kitchen DOCUSATE SODIUM 100 MG  PO CAPS Oral Take 100 mg by mouth daily as needed. For shortness of breath    . ESOMEPRAZOLE MAGNESIUM 40 MG PO CPDR Oral Take 1 capsule (40 mg total) by mouth 2 (two) times daily. 180 capsule 3    **Patient requests 90 day supply**  . HYDROCODONE-ACETAMINOPHEN 5-325 MG PO TABS Oral Take 1-2 tablets by mouth every 4 (four) hours as needed. 60 tablet 0  . LORATADINE-PSEUDOEPHEDRINE ER 5-120 MG PO TB12 Oral Take 1 tablet by mouth 2 (two) times daily. 30 tablet 0  . MORPHINE SULFATE 15 MG PO TABS Oral Take 15 mg by mouth 2 (two) times daily as needed. Pain    . PRAVASTATIN SODIUM 80 MG PO TABS Oral Take 1 tablet (80 mg total) by mouth daily. 30 tablet 11  . PROMETHAZINE HCL 12.5 MG PO TABS Oral Take 2 tablets (25 mg total) by mouth every 6 (six) hours as needed for nausea. 30 tablet 0    BP 115/43  Pulse 113  Temp 99 F (37.2 C) (Oral)  Resp 18  SpO2 91%  LMP 08/25/1980  Physical Exam  Nursing note and vitals  reviewed. Constitutional: She is oriented to person, place, and time. She appears well-developed and well-nourished. No distress.  HENT:  Head: Normocephalic and atraumatic.  Eyes: Conjunctivae and EOM are normal.  Cardiovascular: Normal rate and regular rhythm.   Pulmonary/Chest: Effort normal and breath sounds normal. No stridor. No respiratory distress.  Abdominal: She exhibits no distension. There is no hepatosplenomegaly. There is generalized tenderness. There is guarding. There is no rigidity, no rebound and no CVA tenderness.    Musculoskeletal: She exhibits no edema.  Neurological: She is alert and oriented to person, place, and time. No cranial nerve deficit.  Skin: Skin is warm and dry.  Psychiatric: She has a normal mood and affect.    ED Course  Procedures (including critical care time)   Labs Reviewed  CBC WITH DIFFERENTIAL  COMPREHENSIVE METABOLIC PANEL  LIPASE, BLOOD  URINALYSIS, ROUTINE W REFLEX MICROSCOPIC  LACTIC ACID, PLASMA  CULTURE, BLOOD (ROUTINE X 2)  CULTURE, BLOOD (ROUTINE X 2)   No results found.   No diagnosis found.  Pulse ox 93%ra, abnormal   MDM  This 66yo F w recent Hx of bowel perf w colostomy placement now p/w abd pain, and concerns of decreased stool production. On my exam the patient is uncomfortable appearing, though in no distress.  She has a tender, non-distended abdomen with small amount of material in her colostomy pouch. Given the description of abdominal pain, possibly decreased stool production of concern for abscess, infection or obstruction.  The patient's vital signs were reassuring, and her labs do not demonstrate evidence of ongoing systemic infection.  The patient's CT scan and initially performed x-ray were further reassurance.  The patient had analgesia with ED interventions, was discharged in stable condition to f/u with her surgeon.     Gerhard Munch, MD 08/17/11 1943

## 2011-08-17 NOTE — ED Notes (Signed)
[id number]<AV> Expected date:08/17/11<BR> Expected time: 2:51 PM<BR> Means of arrival:Ambulance<BR> Comments:<BR> 65yoF,abd pain/no bowel sounds

## 2011-08-17 NOTE — ED Notes (Signed)
md at bedside  Pt alert and oriented x4. Respirations even and unlabored, bilateral symmetrical rise and fall of chest. Skin warm and dry. In no acute distress. Denies needs.   

## 2011-08-17 NOTE — ED Notes (Signed)
Per ems pt is from [facility]. Alert and oriented x4. Pt c/o 7/10 abdominal pain around surgical site, mainly in lower left quadrant. No bowel sounds present. No fecal matter in colostomy bag x2 days. Tender and soft upon palpation. Pt was admitted to [facility] on 7/3 to receive colostomy and abdominal procedures.

## 2011-08-18 LAB — URINE CULTURE

## 2011-08-19 ENCOUNTER — Encounter (INDEPENDENT_AMBULATORY_CARE_PROVIDER_SITE_OTHER): Payer: Medicare Other | Admitting: Surgery

## 2011-08-19 ENCOUNTER — Telehealth (INDEPENDENT_AMBULATORY_CARE_PROVIDER_SITE_OTHER): Payer: Self-pay

## 2011-08-19 NOTE — Telephone Encounter (Signed)
Dr Michaell Cowing told me to look at discharge note to see if pt was discharged to a nursing facility and the notes from the social worker states she was discharged to [facility] facility. I called [name] to see what happened with the pt missing her appt today and her nurse Edgardo Roys did not know about the appt today with Dr Michaell Cowing. I r/s the appt with Greta for next Wednesday 7/31 arrive at 11:30.

## 2011-08-19 NOTE — Telephone Encounter (Signed)
[name] wanting to know what happened that the pt missed her appt today with Dr Michaell Cowing. I asked for the pt to call me back.

## 2011-08-23 LAB — CULTURE, BLOOD (ROUTINE X 2): Culture: NO GROWTH

## 2011-08-26 ENCOUNTER — Encounter (INDEPENDENT_AMBULATORY_CARE_PROVIDER_SITE_OTHER): Payer: Self-pay | Admitting: Surgery

## 2011-08-26 ENCOUNTER — Ambulatory Visit (INDEPENDENT_AMBULATORY_CARE_PROVIDER_SITE_OTHER): Payer: Medicare Other | Admitting: Surgery

## 2011-08-26 VITALS — BP 128/74 | HR 82 | Resp 18 | Ht 64.0 in | Wt 144.0 lb

## 2011-08-26 DIAGNOSIS — K5732 Diverticulitis of large intestine without perforation or abscess without bleeding: Secondary | ICD-10-CM

## 2011-08-26 DIAGNOSIS — R5381 Other malaise: Secondary | ICD-10-CM | POA: Insufficient documentation

## 2011-08-26 DIAGNOSIS — K5720 Diverticulitis of large intestine with perforation and abscess without bleeding: Secondary | ICD-10-CM

## 2011-08-26 NOTE — Patient Instructions (Addendum)
Diverticulitis A diverticulum is a small pouch or sac on the colon. Diverticulosis is the presence of these diverticula on the colon. Diverticulitis is the irritation (inflammation) or infection of diverticula. CAUSES  The colon and its diverticula contain bacteria. If food particles block the tiny opening to a diverticulum, the bacteria inside can grow and cause an increase in pressure. This leads to infection and inflammation and is called diverticulitis. SYMPTOMS   Abdominal pain and tenderness. Usually, the pain is located on the left side of your abdomen. However, it could be located elsewhere.   Fever.   Bloating.   Feeling sick to your stomach (nausea).   Throwing up (vomiting).   Abnormal stools.  DIAGNOSIS  Your caregiver will take a history and perform a physical exam. Since many things can cause abdominal pain, other tests may be necessary. Tests may include:  Blood tests.   Urine tests.   X-ray of the abdomen.   CT scan of the abdomen.  Sometimes, surgery is needed to determine if diverticulitis or other conditions are causing your symptoms. TREATMENT  Most of the time, you can be treated without surgery. Treatment includes:  Resting the bowels by only having liquids for a few days. As you improve, you will need to eat a low-fiber diet.   Intravenous (IV) fluids if you are losing body fluids (dehydrated).   Antibiotic medicines that treat infections may be given.   Pain and nausea medicine, if needed.   Surgery if the inflamed diverticulum has burst.  HOME CARE INSTRUCTIONS   Try a clear liquid diet (broth, tea, or water for as long as directed by your caregiver). You may then gradually begin a low-fiber diet as tolerated. A low-fiber diet is a diet with less than 10 grams of fiber. Choose the foods below to reduce fiber in the diet:   White breads, cereals, rice, and pasta.   Cooked fruits and vegetables or soft fresh fruits and vegetables without the skin.     Ground or well-cooked tender beef, ham, veal, lamb, pork, or poultry.   Eggs and seafood.   After your diverticulitis symptoms have improved, your caregiver may put you on a high-fiber diet. A high-fiber diet includes 14 grams of fiber for every 1000 calories consumed. For a standard 2000 calorie diet, you would need 28 grams of fiber. Follow these diet guidelines to help you increase the fiber in your diet. It is important to slowly increase the amount fiber in your diet to avoid gas, constipation, and bloating.   Choose whole-grain breads, cereals, pasta, and brown rice.   Choose fresh fruits and vegetables with the skin on. Do not overcook vegetables because the more vegetables are cooked, the more fiber is lost.   Choose more nuts, seeds, legumes, dried peas, beans, and lentils.   Look for food products that have greater than 3 grams of fiber per serving on the Nutrition Facts label.   Take all medicine as directed by your caregiver.   If your caregiver has given you a follow-up appointment, it is very important that you go. Not going could result in lasting (chronic) or permanent injury, pain, and disability. If there is any problem keeping the appointment, call to reschedule.  SEEK MEDICAL CARE IF:   Your pain does not improve.   You have a hard time advancing your diet beyond clear liquids.   Your bowel movements do not return to normal.  SEEK IMMEDIATE MEDICAL CARE IF:   Your pain becomes   worse.   You have an oral temperature above 102 F (38.9 C), not controlled by medicine.   You have repeated vomiting.   You have bloody or black, tarry stools.   Symptoms that brought you to your caregiver become worse or are not getting better.  MAKE SURE YOU:   Understand these instructions.   Will watch your condition.   Will get help right away if you are not doing well or get worse.  Document Released: 10/22/2004 Document Revised: 01/01/2011 Document Reviewed:  02/17/2010 [facility] Patient Information 2012 [place], [place].  Exercise to Stay Healthy Exercise helps you become and stay healthy. EXERCISE IDEAS AND TIPS Choose exercises that:  You enjoy.   Fit into your day.  You do not need to exercise really hard to be healthy. You can do exercises at a slow or medium level and stay healthy. You can:  Stretch before and after working out.   Try yoga, Pilates, or tai chi.   Lift weights.   Walk fast, swim, jog, run, climb stairs, bicycle, dance, or rollerskate.   Take aerobic classes.  Exercises that burn about 150 calories:  Running 1  miles in 15 minutes.   Playing volleyball for 45 to 60 minutes.   Washing and waxing a car for 45 to 60 minutes.   Playing touch football for 45 minutes.   Walking 1  miles in 35 minutes.   Pushing a stroller 1  miles in 30 minutes.   Playing basketball for 30 minutes.   Raking leaves for 30 minutes.   Bicycling 5 miles in 30 minutes.   Walking 2 miles in 30 minutes.   Dancing for 30 minutes.   Shoveling snow for 15 minutes.   Swimming laps for 20 minutes.   Walking up stairs for 15 minutes.   Bicycling 4 miles in 15 minutes.   Gardening for 30 to 45 minutes.   Jumping rope for 15 minutes.   Washing windows or floors for 45 to 60 minutes.  Document Released: 02/14/2010 Document Revised: 01/01/2011 Document Reviewed: 02/14/2010 [facility] Patient Information 2012 [place], [place].

## 2011-08-26 NOTE — Progress Notes (Signed)
Subjective:     Patient ID: [name] Bell, female   DOB: 16-May-1945, 66 y.o.   MRN: [id number]  HPI  [name] Bell  [birth year] [id number]  Patient Care Team: Linward Headland, MD as PCP - General (Internal Medicine) Petra Kuba, MD as Consulting Physician (Gastroenterology)  This patient is a 66 y.o.female who presents today for surgical evaluation  Procedure: Laparoscopic lysis of adhesions and rectal sigmoid resection with end colostomy 07/30/2011.   REPORT OF SURGICAL PATHOLOGY FINAL DIAGNOSIS Diagnosis Colon, segmental resection, Recto-Sigmoid - BENIGN COLONIC DIVERTICULA WITH TRANSMURAL ACUTE AND CHRONIC INFLAMMATION, ABSCESS AND SEROSITIS. - NEGATIVE FOR DYSPLASIA OR MALIGNANCY. [name] Bell Pathologist, Electronic Signature  The patient comes in today almost a month out from surgery.  She had some moderate abdominal pain.  Came to the emergency room.  CT scan was negative for infection.  She says she is feeling better.  She still pretty deconditioned and wiped out.  She's at a skilled facility.  Getting rehabilitation.  Starting to feel stronger with that.  Appetite fair but improving.  No nausea or vomiting.  No fevers chills or sweats.  Colostomy emptying every day.  Energy level slowly coming back  Patient Active Problem List  Diagnosis  . VITAMIN B12 DEFICIENCY  . HYPERLIPIDEMIA  . TOBACCO ABUSE  . DEPRESSION  . Chronic pain syndrome  . VISUAL ACUITY, DECREASED, LEFT EYE  . EXTERNAL HEMORRHOIDS  . TEMPOROMANDIBULAR JOINT PAIN  . ACUTE DUODENAL ULCER W/HEMORRHAGE&OBSTRUCTION  . OSTEOPOROSIS  . COMPRESSION FRACTURE, THORACIC VERTEBRA  . OTH&UNS SUP INJR OTH MX&UNS SITE W/O MENTION INF  . CARCINOMA, BASAL CELL, CHEEK, LEFT  . Constipation  . Colon perforation, stercoral vs diverticular    Past Medical History  Diagnosis Date  . Superior mesenteric artery syndrome 11/08    sp dilation by Dr. Ewing Schlein, Pt may require bowel resetion if sx recur  . Duodenal ulcer 11/08      With hemorrhage and obstruction  . Osteomyelitis, jaw acute 11/08    Started while in the hospital abcess showed GNR . SP debridement by Dr. Neoma Laming,  Ernesta Amble S Bovis sp 4  weeks of Pen V started on 05/19/07  with additional 2 weeks in 07/04/07  . Degenerative disk disease     This is interscapular, mild compression frx of T12 superior endplate with Schmorl's node. Seen on CT in 2/08  . Back pain   . Depression   . Acute sinusitis 06/30/2011  . Ischemic colitis 07/30/2011    2009 by endoscopy   . Perforated bowel     surgery july 2013    Past Surgical History  Procedure Date  . Abdominal hysterectomy   . Colostomy 07/30/2011    Procedure: COLOSTOMY;  Surgeon: Ardeth Sportsman, MD;  Location: [facility] OR;  Service: General;  Laterality: Left;  . Lysis of adhesions 07/30/2011  . Sigmoid colectomy 07/30/2011  . Transrectal drainage of pelvic abscess 07/30/2011    History   Social History  . Marital Status: Divorced    Spouse Name: N/A    Number of Children: N/A  . Years of Education: N/A   Occupational History  . Not on file.   Social History Main Topics  . Smoking status: Current Some Day Smoker -- 2.0 packs/day for 20 years    Types: Cigarettes  . Smokeless tobacco: Not on file  . Alcohol Use: No  . Drug Use: No  . Sexually Active: Not on file   Other Topics Concern  .  Not on file   Social History Narrative   Pt now lives by herself in an ALF [facility]) where she can come and go as she pleases.  A good friend named [name] Bell still looks out for her daily.     Family History  Problem Relation Age of Onset  . Cancer Mother   . Heart disease Father   . Diabetes Sister     Current Outpatient Prescriptions  Medication Sig Dispense Refill  . buPROPion (WELLBUTRIN) 100 MG tablet Take 1 tablet (100 mg total) by mouth 3 (three) times daily.  90 tablet  3  . calcium-vitamin D (OSCAL WITH D 500-200) 500-200 MG-UNIT per tablet Take 1 tablet by mouth 3 (three) times daily.        Marland Kitchen docusate  sodium (COLACE) 100 MG capsule Take 100 mg by mouth daily as needed. For shortness of breath      . esomeprazole (NEXIUM) 40 MG capsule Take 1 capsule (40 mg total) by mouth 2 (two) times daily.  180 capsule  3  . loratadine-pseudoephedrine (CLARITIN-D 12 HOUR) 5-120 MG per tablet Take 1 tablet by mouth 2 (two) times daily.  30 tablet  0  . morphine (MSIR) 15 MG tablet Take 15 mg by mouth 2 (two) times daily as needed. Pain      . pravastatin (PRAVACHOL) 80 MG tablet Take 1 tablet (80 mg total) by mouth daily.  30 tablet  11  . promethazine (PHENERGAN) 12.5 MG tablet Take 2 tablets (25 mg total) by mouth every 6 (six) hours as needed for nausea.  30 tablet  0     Allergies  Allergen Reactions  . Ibuprofen     REACTION: Bleeding ulcers  . Latex   . Nsaids     REACTION: Bleeding Ulcer    BP 128/74  Pulse 82  Resp 18  Ht 5\' 4"  (1.626 m)  Wt 144 lb (65.318 kg)  BMI 24.72 kg/m2  LMP 08/25/1980  Ct Abdomen Pelvis W Contrast  08/17/2011  *RADIOLOGY REPORT*  Clinical Data: Abdominal pain.  Recent left-sided colostomy with rectosigmoid resection on 07/30/2011 for sigmoid colonic perforation and peritonitis.  CT ABDOMEN AND PELVIS WITH CONTRAST  Technique:  Multidetector CT imaging of the abdomen and pelvis was performed following the standard protocol during bolus administration of intravenous contrast.  Contrast: 80mL OMNIPAQUE IOHEXOL 300 MG/ML  SOLN  Comparison: 07/30/2011  Findings: There is no evidence of bowel perforation, abscess or bowel obstruction.  Left-sided colostomy appears without evidence of complication by CT.  The liver, gallbladder, pancreas, spleen, adrenal glands and kidneys are unremarkable.  The visualized lung bases show bibasilar atelectasis and a small left pleural effusion.  No masses or enlarged lymph nodes are identified.  The bladder is unremarkable.  Sagittal reconstructions shows stable mild compression of the superior endplate of T12.  IMPRESSION: No acute  abnormalities identified by CT.  Left-sided colostomy appears uncomplicated.  Original Report Authenticated By: Reola Calkins, M.D.   Ct Abdomen Pelvis W Contrast  07/30/2011  *RADIOLOGY REPORT*  Clinical Data: Abdominal pain  CT ABDOMEN AND PELVIS WITH CONTRAST  Technique:  Multidetector CT imaging of the abdomen and pelvis was performed following the standard protocol during bolus administration of intravenous contrast.  Contrast: OMNIPAQUE IOHEXOL 300 MG/ML  SOLN  Comparison: 07/29/2011 radiograph, 05/25/2008 CT  Findings: Fibrotic changes at the bases.  Coronary artery calcification.  Hepatic steatosis, distended gallbladder.  No radiodense gallstones.  No pericholecystic fluid.  No biliary ductal  dilatation.  Unremarkable spleen.  Unremarkable pancreas and adrenal glands.  Symmetric renal enhancement.  No hydronephrosis or hydroureter.  There is a segment of sigmoid colonic wall thickening, with pericolonic fat stranding and fluid as well as extraluminal air. No bowel obstruction.  The appendix is not identified.  Reactive sized mesenteric and retroperitoneal lymph nodes.  There is scattered atherosclerotic calcification of the aorta and its branches. No aneurysmal dilatation.  Retroaortic left renal vein.  Thin-walled bladder.  Absent uterus.  No adnexal mass.  Osteopenia.  No acute osseous finding.  Mild height loss at T12 with a prominent Schmorl's node.  L5 S1 degenerative disc disease. Marked sigmoid colonic wall thickening and a has a given the  IMPRESSION: Thickened segment of sigmoid colon with pericolonic fat stranding as well as extraluminal air and fluid, in keeping with perforation. The underlying process is favored to be a colitis (ischemic, infectious, or inflammatory) or diverticulitis.  Appendix not identified.  There is fluid and air in the right paracolic gutter, presumably tracking upward from the sigmoid colonic process however correlate with surgical history.  Discussed via  telephone with Dr. Rosalia Hammers at 03:30 a.m. on 07/30/2011.  Original Report Authenticated By: Waneta Martins, M.D.   Dg Abd Acute W/chest  08/17/2011  *RADIOLOGY REPORT*  Clinical Data: Nausea and vomiting and abdominal pain for 3 days. Colostomy placed 07/30/2011  ACUTE ABDOMEN SERIES (ABDOMEN 2 VIEW & CHEST 1 VIEW)  Comparison: CT 07/30/2011  Findings: Left basilar scarring or atelectasis.  Heart size is normal.  No free air beneath the diaphragms.  A few prominent mid abdominal small bowel loops are noted with maximal caliber 2.4 cm. No colonic dilatation.  No abnormal differential air-fluid level.  No abnormal calcification.  Osseous structures are grossly unremarkable.  IMPRESSION: No evidence for free air or bowel obstruction.  A few prominent mid abdominal small bowel loops are nonspecific.  Left basilar scarring or atelectasis.  Original Report Authenticated By: Harrel Lemon, M.D.   Dg Abd Acute W/chest  07/30/2011  *RADIOLOGY REPORT*  Clinical Data: Abdominal pain, vomiting.  ACUTE ABDOMEN SERIES (ABDOMEN 2 VIEW & CHEST 1 VIEW)  Comparison: 01/31/2008  Findings: The lungs are clear.  Heart is normal size.  No effusions or acute bony abnormality.  Moderate stool throughout the colon.  Nonobstructive bowel gas pattern.  No free air.  No organomegaly or suspicious calcification.  No acute bony abnormality.  IMPRESSION: Moderate stool burden throughout the colon.  No acute findings.  Original Report Authenticated By: Cyndie Chime, M.D.     Review of Systems  Constitutional: Negative for fever, chills and diaphoresis.  HENT: Negative for ear pain, sore throat and trouble swallowing.   Eyes: Negative for photophobia and visual disturbance.  Respiratory: Positive for shortness of breath. Negative for apnea, cough, choking and chest tightness.        Using oxygen (was not preop)  Cardiovascular: Negative for chest pain and palpitations.  Gastrointestinal: Negative for nausea, vomiting, abdominal  pain, diarrhea, constipation, anal bleeding and rectal pain.  Genitourinary: Negative for dysuria, frequency and difficulty urinating.  Musculoskeletal: Negative for myalgias and gait problem.  Skin: Negative for color change, pallor and rash.  Neurological: Negative for dizziness, speech difficulty, weakness and numbness.  Hematological: Negative for adenopathy.  Psychiatric/Behavioral: Negative for confusion and agitation. The patient is not nervous/anxious.        Objective:   Physical Exam  Constitutional: She is oriented to person, place, and time. She appears well-developed  and well-nourished. No distress.  HENT:  Head: Normocephalic.  Mouth/Throat: Oropharynx is clear and moist. No oropharyngeal exudate.  Eyes: EOM are normal. Pupils are equal, round, and reactive to light. No scleral icterus.  Neck: Normal range of motion. No tracheal deviation present.  Cardiovascular: Normal rate and intact distal pulses.   Pulmonary/Chest: Effort normal. No respiratory distress. She exhibits no tenderness.  Abdominal: Soft. She exhibits no distension. There is no tenderness. Hernia confirmed negative in the right inguinal area and confirmed negative in the left inguinal area.         Incisions clean with normal healing ridges.  No hernias  Genitourinary: No vaginal discharge found.  Musculoskeletal: Normal range of motion. She exhibits no tenderness.  Lymphadenopathy:       Right: No inguinal adenopathy present.       Left: No inguinal adenopathy present.  Neurological: She is alert and oriented to person, place, and time. No cranial nerve deficit. She exhibits normal muscle tone. Coordination normal.  Skin: Skin is warm and dry. No rash noted. She is not diaphoretic.  Psychiatric: Her speech is normal and behavior is normal. Her affect is not angry. She is not withdrawn. She exhibits a depressed mood.       Assessment:     Four weeks status post emergent colectomy for perforated  diverticulitis in the setting of chronic constipation.  Rather deconditioned.  Slowly recovering  Moving outside of the window of postoperative abscess or major postoperative complications    Plan:     Increase activity as tolerated.  Bell not push through pain.  Agree with aggressive rehabilitation  Advanced on diet as tolerated. Bowel regimen to avoid problems.  Consider supplemental shakes to help improve nutrition.  Avoid constipation  Return to clinic 1 month To see she is improving.  If she makes significant strides, perhaps consider lap-assisted colostomy takedown in a few months.  If she remains oxygen dependent and deconditioned, consider keeping colostomy permanent.  She is open to either idea.  Let's see how she progresses.. The patient expressed understanding and appreciation

## 2011-09-16 DIAGNOSIS — J019 Acute sinusitis, unspecified: Secondary | ICD-10-CM | POA: Diagnosis not present

## 2011-09-21 DIAGNOSIS — R21 Rash and other nonspecific skin eruption: Secondary | ICD-10-CM | POA: Diagnosis not present

## 2011-09-21 DIAGNOSIS — J019 Acute sinusitis, unspecified: Secondary | ICD-10-CM | POA: Diagnosis not present

## 2011-09-29 DIAGNOSIS — K631 Perforation of intestine (nontraumatic): Secondary | ICD-10-CM | POA: Diagnosis not present

## 2011-09-29 DIAGNOSIS — J019 Acute sinusitis, unspecified: Secondary | ICD-10-CM | POA: Diagnosis not present

## 2011-09-29 DIAGNOSIS — R21 Rash and other nonspecific skin eruption: Secondary | ICD-10-CM | POA: Diagnosis not present

## 2011-10-02 ENCOUNTER — Encounter (INDEPENDENT_AMBULATORY_CARE_PROVIDER_SITE_OTHER): Payer: Medicare Other | Admitting: Surgery

## 2011-10-02 DIAGNOSIS — M549 Dorsalgia, unspecified: Secondary | ICD-10-CM | POA: Diagnosis not present

## 2011-10-02 DIAGNOSIS — K631 Perforation of intestine (nontraumatic): Secondary | ICD-10-CM | POA: Diagnosis not present

## 2011-10-02 DIAGNOSIS — F329 Major depressive disorder, single episode, unspecified: Secondary | ICD-10-CM | POA: Diagnosis not present

## 2011-10-02 DIAGNOSIS — K5732 Diverticulitis of large intestine without perforation or abscess without bleeding: Secondary | ICD-10-CM | POA: Diagnosis not present

## 2011-10-02 DIAGNOSIS — J449 Chronic obstructive pulmonary disease, unspecified: Secondary | ICD-10-CM | POA: Diagnosis not present

## 2011-10-02 DIAGNOSIS — Z433 Encounter for attention to colostomy: Secondary | ICD-10-CM | POA: Diagnosis not present

## 2011-10-06 DIAGNOSIS — K5732 Diverticulitis of large intestine without perforation or abscess without bleeding: Secondary | ICD-10-CM | POA: Diagnosis not present

## 2011-10-06 DIAGNOSIS — F329 Major depressive disorder, single episode, unspecified: Secondary | ICD-10-CM | POA: Diagnosis not present

## 2011-10-06 DIAGNOSIS — K631 Perforation of intestine (nontraumatic): Secondary | ICD-10-CM | POA: Diagnosis not present

## 2011-10-06 DIAGNOSIS — M549 Dorsalgia, unspecified: Secondary | ICD-10-CM | POA: Diagnosis not present

## 2011-10-06 DIAGNOSIS — J449 Chronic obstructive pulmonary disease, unspecified: Secondary | ICD-10-CM | POA: Diagnosis not present

## 2011-10-06 DIAGNOSIS — Z433 Encounter for attention to colostomy: Secondary | ICD-10-CM | POA: Diagnosis not present

## 2011-10-07 DIAGNOSIS — Z433 Encounter for attention to colostomy: Secondary | ICD-10-CM | POA: Diagnosis not present

## 2011-10-07 DIAGNOSIS — M549 Dorsalgia, unspecified: Secondary | ICD-10-CM | POA: Diagnosis not present

## 2011-10-07 DIAGNOSIS — K631 Perforation of intestine (nontraumatic): Secondary | ICD-10-CM | POA: Diagnosis not present

## 2011-10-07 DIAGNOSIS — F329 Major depressive disorder, single episode, unspecified: Secondary | ICD-10-CM | POA: Diagnosis not present

## 2011-10-07 DIAGNOSIS — K5732 Diverticulitis of large intestine without perforation or abscess without bleeding: Secondary | ICD-10-CM | POA: Diagnosis not present

## 2011-10-07 DIAGNOSIS — J449 Chronic obstructive pulmonary disease, unspecified: Secondary | ICD-10-CM | POA: Diagnosis not present

## 2011-10-08 DIAGNOSIS — F329 Major depressive disorder, single episode, unspecified: Secondary | ICD-10-CM | POA: Diagnosis not present

## 2011-10-08 DIAGNOSIS — J449 Chronic obstructive pulmonary disease, unspecified: Secondary | ICD-10-CM | POA: Diagnosis not present

## 2011-10-08 DIAGNOSIS — K631 Perforation of intestine (nontraumatic): Secondary | ICD-10-CM | POA: Diagnosis not present

## 2011-10-08 DIAGNOSIS — M549 Dorsalgia, unspecified: Secondary | ICD-10-CM | POA: Diagnosis not present

## 2011-10-08 DIAGNOSIS — Z433 Encounter for attention to colostomy: Secondary | ICD-10-CM | POA: Diagnosis not present

## 2011-10-08 DIAGNOSIS — K5732 Diverticulitis of large intestine without perforation or abscess without bleeding: Secondary | ICD-10-CM | POA: Diagnosis not present

## 2011-10-12 ENCOUNTER — Telehealth: Payer: Self-pay | Admitting: Licensed Clinical Social Worker

## 2011-10-12 DIAGNOSIS — M549 Dorsalgia, unspecified: Secondary | ICD-10-CM | POA: Diagnosis not present

## 2011-10-12 DIAGNOSIS — F329 Major depressive disorder, single episode, unspecified: Secondary | ICD-10-CM | POA: Diagnosis not present

## 2011-10-12 DIAGNOSIS — K631 Perforation of intestine (nontraumatic): Secondary | ICD-10-CM | POA: Diagnosis not present

## 2011-10-12 DIAGNOSIS — K5732 Diverticulitis of large intestine without perforation or abscess without bleeding: Secondary | ICD-10-CM | POA: Diagnosis not present

## 2011-10-12 DIAGNOSIS — Z433 Encounter for attention to colostomy: Secondary | ICD-10-CM | POA: Diagnosis not present

## 2011-10-12 DIAGNOSIS — J449 Chronic obstructive pulmonary disease, unspecified: Secondary | ICD-10-CM | POA: Diagnosis not present

## 2011-10-12 NOTE — Telephone Encounter (Signed)
CSW received request for [facility] referral.  Pt's last visit in [facility] was 06/30/2011, over 90 days.  CSW placed call to pt, left message indicating pt would need to see PCP prior to [facility] referral.  CSW left [facility] contact information for scheduling.

## 2011-10-13 ENCOUNTER — Encounter (INDEPENDENT_AMBULATORY_CARE_PROVIDER_SITE_OTHER): Payer: Self-pay | Admitting: Surgery

## 2011-10-14 DIAGNOSIS — M549 Dorsalgia, unspecified: Secondary | ICD-10-CM | POA: Diagnosis not present

## 2011-10-14 DIAGNOSIS — Z433 Encounter for attention to colostomy: Secondary | ICD-10-CM | POA: Diagnosis not present

## 2011-10-14 DIAGNOSIS — K631 Perforation of intestine (nontraumatic): Secondary | ICD-10-CM | POA: Diagnosis not present

## 2011-10-14 DIAGNOSIS — F329 Major depressive disorder, single episode, unspecified: Secondary | ICD-10-CM | POA: Diagnosis not present

## 2011-10-14 DIAGNOSIS — K5732 Diverticulitis of large intestine without perforation or abscess without bleeding: Secondary | ICD-10-CM | POA: Diagnosis not present

## 2011-10-14 DIAGNOSIS — J449 Chronic obstructive pulmonary disease, unspecified: Secondary | ICD-10-CM | POA: Diagnosis not present

## 2011-10-15 DIAGNOSIS — K5732 Diverticulitis of large intestine without perforation or abscess without bleeding: Secondary | ICD-10-CM | POA: Diagnosis not present

## 2011-10-15 DIAGNOSIS — J449 Chronic obstructive pulmonary disease, unspecified: Secondary | ICD-10-CM | POA: Diagnosis not present

## 2011-10-15 DIAGNOSIS — K631 Perforation of intestine (nontraumatic): Secondary | ICD-10-CM | POA: Diagnosis not present

## 2011-10-15 DIAGNOSIS — F329 Major depressive disorder, single episode, unspecified: Secondary | ICD-10-CM | POA: Diagnosis not present

## 2011-10-15 DIAGNOSIS — M549 Dorsalgia, unspecified: Secondary | ICD-10-CM | POA: Diagnosis not present

## 2011-10-15 DIAGNOSIS — Z433 Encounter for attention to colostomy: Secondary | ICD-10-CM | POA: Diagnosis not present

## 2011-10-19 DIAGNOSIS — M549 Dorsalgia, unspecified: Secondary | ICD-10-CM | POA: Diagnosis not present

## 2011-10-19 DIAGNOSIS — K5732 Diverticulitis of large intestine without perforation or abscess without bleeding: Secondary | ICD-10-CM | POA: Diagnosis not present

## 2011-10-19 DIAGNOSIS — Z433 Encounter for attention to colostomy: Secondary | ICD-10-CM | POA: Diagnosis not present

## 2011-10-19 DIAGNOSIS — F329 Major depressive disorder, single episode, unspecified: Secondary | ICD-10-CM | POA: Diagnosis not present

## 2011-10-19 DIAGNOSIS — J449 Chronic obstructive pulmonary disease, unspecified: Secondary | ICD-10-CM | POA: Diagnosis not present

## 2011-10-19 DIAGNOSIS — K631 Perforation of intestine (nontraumatic): Secondary | ICD-10-CM | POA: Diagnosis not present

## 2011-10-20 DIAGNOSIS — F329 Major depressive disorder, single episode, unspecified: Secondary | ICD-10-CM | POA: Diagnosis not present

## 2011-10-20 DIAGNOSIS — K5732 Diverticulitis of large intestine without perforation or abscess without bleeding: Secondary | ICD-10-CM | POA: Diagnosis not present

## 2011-10-20 DIAGNOSIS — M549 Dorsalgia, unspecified: Secondary | ICD-10-CM | POA: Diagnosis not present

## 2011-10-20 DIAGNOSIS — K631 Perforation of intestine (nontraumatic): Secondary | ICD-10-CM | POA: Diagnosis not present

## 2011-10-20 DIAGNOSIS — Z433 Encounter for attention to colostomy: Secondary | ICD-10-CM | POA: Diagnosis not present

## 2011-10-20 DIAGNOSIS — J449 Chronic obstructive pulmonary disease, unspecified: Secondary | ICD-10-CM | POA: Diagnosis not present

## 2011-10-22 DIAGNOSIS — Z433 Encounter for attention to colostomy: Secondary | ICD-10-CM | POA: Diagnosis not present

## 2011-10-22 DIAGNOSIS — J449 Chronic obstructive pulmonary disease, unspecified: Secondary | ICD-10-CM | POA: Diagnosis not present

## 2011-10-22 DIAGNOSIS — K631 Perforation of intestine (nontraumatic): Secondary | ICD-10-CM | POA: Diagnosis not present

## 2011-10-22 DIAGNOSIS — K5732 Diverticulitis of large intestine without perforation or abscess without bleeding: Secondary | ICD-10-CM | POA: Diagnosis not present

## 2011-10-22 DIAGNOSIS — M549 Dorsalgia, unspecified: Secondary | ICD-10-CM | POA: Diagnosis not present

## 2011-10-22 DIAGNOSIS — F329 Major depressive disorder, single episode, unspecified: Secondary | ICD-10-CM | POA: Diagnosis not present

## 2011-10-23 DIAGNOSIS — Z433 Encounter for attention to colostomy: Secondary | ICD-10-CM | POA: Diagnosis not present

## 2011-10-23 DIAGNOSIS — K631 Perforation of intestine (nontraumatic): Secondary | ICD-10-CM | POA: Diagnosis not present

## 2011-10-23 DIAGNOSIS — F329 Major depressive disorder, single episode, unspecified: Secondary | ICD-10-CM | POA: Diagnosis not present

## 2011-10-23 DIAGNOSIS — M549 Dorsalgia, unspecified: Secondary | ICD-10-CM | POA: Diagnosis not present

## 2011-10-23 DIAGNOSIS — J449 Chronic obstructive pulmonary disease, unspecified: Secondary | ICD-10-CM | POA: Diagnosis not present

## 2011-10-23 DIAGNOSIS — K5732 Diverticulitis of large intestine without perforation or abscess without bleeding: Secondary | ICD-10-CM | POA: Diagnosis not present

## 2011-10-26 DIAGNOSIS — M549 Dorsalgia, unspecified: Secondary | ICD-10-CM | POA: Diagnosis not present

## 2011-10-26 DIAGNOSIS — F329 Major depressive disorder, single episode, unspecified: Secondary | ICD-10-CM | POA: Diagnosis not present

## 2011-10-26 DIAGNOSIS — Z433 Encounter for attention to colostomy: Secondary | ICD-10-CM | POA: Diagnosis not present

## 2011-10-26 DIAGNOSIS — J449 Chronic obstructive pulmonary disease, unspecified: Secondary | ICD-10-CM | POA: Diagnosis not present

## 2011-10-26 DIAGNOSIS — K5732 Diverticulitis of large intestine without perforation or abscess without bleeding: Secondary | ICD-10-CM | POA: Diagnosis not present

## 2011-10-26 DIAGNOSIS — K631 Perforation of intestine (nontraumatic): Secondary | ICD-10-CM | POA: Diagnosis not present

## 2011-10-27 ENCOUNTER — Telehealth (INDEPENDENT_AMBULATORY_CARE_PROVIDER_SITE_OTHER): Payer: Self-pay | Admitting: General Surgery

## 2011-10-27 ENCOUNTER — Ambulatory Visit (INDEPENDENT_AMBULATORY_CARE_PROVIDER_SITE_OTHER): Payer: Medicare Other | Admitting: Surgery

## 2011-10-27 ENCOUNTER — Encounter (INDEPENDENT_AMBULATORY_CARE_PROVIDER_SITE_OTHER): Payer: Self-pay | Admitting: General Surgery

## 2011-10-27 ENCOUNTER — Encounter (INDEPENDENT_AMBULATORY_CARE_PROVIDER_SITE_OTHER): Payer: Self-pay | Admitting: Surgery

## 2011-10-27 VITALS — BP 94/62 | HR 84 | Temp 98.3°F | Resp 16 | Ht 64.0 in | Wt 147.0 lb

## 2011-10-27 DIAGNOSIS — Z933 Colostomy status: Secondary | ICD-10-CM

## 2011-10-27 DIAGNOSIS — K5720 Diverticulitis of large intestine with perforation and abscess without bleeding: Secondary | ICD-10-CM

## 2011-10-27 DIAGNOSIS — R5381 Other malaise: Secondary | ICD-10-CM

## 2011-10-27 DIAGNOSIS — K5732 Diverticulitis of large intestine without perforation or abscess without bleeding: Secondary | ICD-10-CM

## 2011-10-27 NOTE — Progress Notes (Signed)
Subjective:     Patient ID: [name] Bell, female   DOB: [birth year], 66 y.o.   MRN: [id number]  HPI   [name] Bell  Sep 13, 1945 [id number]  Patient Care Team: Linward Headland, MD as PCP - General (Internal Medicine) Petra Kuba, MD as Consulting Physician (Gastroenterology)  This patient is a 66 y.o.female who presents today for surgical evaluation  Procedure: Laparoscopic lysis of adhesions and rectal sigmoid resection with end colostomy 07/30/2011.   REPORT OF SURGICAL PATHOLOGY FINAL DIAGNOSIS Diagnosis Colon, segmental resection, Recto-Sigmoid - BENIGN COLONIC DIVERTICULA WITH TRANSMURAL ACUTE AND CHRONIC INFLAMMATION, ABSCESS AND SEROSITIS. - NEGATIVE FOR DYSPLASIA OR MALIGNANCY. [place] RUND DO Pathologist, Electronic Signature  The patient comes in today three months after surgery.  She graduated from rehabilitation last month.  Eating better.  Still struggles with constipation.  Wondering if it's okay to use milk of magnesia.  Not taking a fiber supplement.  She comes today with a friend.  Off oxygen.  The nearly done with cigarettes.  No longer in a wheelchair.  Not needing a walker.  Can walk a couple blocks before having to stop.  Energy level returning a little  She does not love her colostomy but his glad she's no longer sitting on the toilet for hours at a time.  He is worried about going back to that if she has a colostomy takedown.  Last colonoscopy in 2008.   Patient Active Problem List  Diagnosis  . VITAMIN B12 DEFICIENCY  . HYPERLIPIDEMIA  . TOBACCO ABUSE  . DEPRESSION  . Chronic pain syndrome  . VISUAL ACUITY, DECREASED, LEFT EYE  . EXTERNAL HEMORRHOIDS  . TEMPOROMANDIBULAR JOINT PAIN  . ACUTE DUODENAL ULCER W/HEMORRHAGE&OBSTRUCTION  . OSTEOPOROSIS  . COMPRESSION FRACTURE, THORACIC VERTEBRA  . OTH&UNS SUP INJR OTH MX&UNS SITE W/O MENTION INF  . CARCINOMA, BASAL CELL, CHEEK, LEFT  . Constipation  . Diverticulitis of colon with perforation  . Physical  deconditioning  . Colostomy in place    Past Medical History  Diagnosis Date  . Superior mesenteric artery syndrome 11/08    sp dilation by Dr. Ewing Schlein, Pt may require bowel resetion if sx recur  . Duodenal ulcer 11/08    With hemorrhage and obstruction  . Osteomyelitis, jaw acute 11/08    Started while in the hospital abcess showed GNR . SP debridement by Dr. Neoma Laming,  Ernesta Amble S Bovis sp 4  weeks of Pen V started on 05/19/07  with additional 2 weeks in 07/04/07  . Degenerative disk disease     This is interscapular, mild compression frx of T12 superior endplate with Schmorl's node. Seen on CT in 2/08  . Back pain   . Depression   . Acute sinusitis 06/30/2011  . Ischemic colitis 07/30/2011    2009 by endoscopy   . Perforated bowel     surgery july 2013    Past Surgical History  Procedure Date  . Abdominal hysterectomy   . Colostomy 07/30/2011    Procedure: COLOSTOMY;  Surgeon: Ardeth Sportsman, MD;  Location: [facility] OR;  Service: General;  Laterality: Left;  . Lysis of adhesions 07/30/2011  . Sigmoid colectomy 07/30/2011  . Transrectal drainage of pelvic abscess 07/30/2011    History   Social History  . Marital Status: Divorced    Spouse Name: N/A    Number of Children: N/A  . Years of Education: N/A   Occupational History  . Not on file.   Social History Main Topics  .  Smoking status: Current Some Day Smoker -- 2.0 packs/day for 20 years    Types: Cigarettes  . Smokeless tobacco: Not on file  . Alcohol Use: No  . Drug Use: No  . Sexually Active: Not on file   Other Topics Concern  . Not on file   Social History Narrative   Pt now lives by herself in an ALF [facility]) where she can come and go as she pleases.  A good friend named [name] still looks out for her daily.     Family History  Problem Relation Age of Onset  . Cancer Mother   . Heart disease Father   . Diabetes Sister     Current Outpatient Prescriptions  Medication Sig Dispense Refill  . buPROPion (WELLBUTRIN)  100 MG tablet Take 1 tablet (100 mg total) by mouth 3 (three) times daily.  90 tablet  3  . calcium-vitamin D (OSCAL WITH D 500-200) 500-200 MG-UNIT per tablet Take 1 tablet by mouth 3 (three) times daily.        Marland Kitchen docusate sodium (COLACE) 100 MG capsule Take 100 mg by mouth daily as needed. For shortness of breath      . esomeprazole (NEXIUM) 40 MG capsule Take 1 capsule (40 mg total) by mouth 2 (two) times daily.  180 capsule  3  . loratadine-pseudoephedrine (CLARITIN-D 12 HOUR) 5-120 MG per tablet Take 1 tablet by mouth 2 (two) times daily.  30 tablet  0  . morphine (MSIR) 15 MG tablet Take 15 mg by mouth 2 (two) times daily as needed. Pain      . pravastatin (PRAVACHOL) 80 MG tablet Take 1 tablet (80 mg total) by mouth daily.  30 tablet  11  . promethazine (PHENERGAN) 12.5 MG tablet Take 25 mg by mouth every 6 (six) hours as needed.      Marland Kitchen DISCONTD: promethazine (PHENERGAN) 12.5 MG tablet Take 2 tablets (25 mg total) by mouth every 6 (six) hours as needed for nausea.  30 tablet  0     Allergies  Allergen Reactions  . Ibuprofen     REACTION: Bleeding ulcers  . Latex   . Nsaids     REACTION: Bleeding Ulcer    BP 94/62  Pulse 84  Temp 98.3 F (36.8 C) (Temporal)  Resp 16  Ht 5\' 4"  (1.626 m)  Wt 147 lb (66.679 kg)  BMI 25.23 kg/m2  LMP 08/25/1980  Ct Abdomen Pelvis W Contrast  08/17/2011  *RADIOLOGY REPORT*  Clinical Data: Abdominal pain.  Recent left-sided colostomy with rectosigmoid resection on 07/30/2011 for sigmoid colonic perforation and peritonitis.  CT ABDOMEN AND PELVIS WITH CONTRAST  Technique:  Multidetector CT imaging of the abdomen and pelvis was performed following the standard protocol during bolus administration of intravenous contrast.  Contrast: 80mL OMNIPAQUE IOHEXOL 300 MG/ML  SOLN  Comparison: 07/30/2011  Findings: There is no evidence of bowel perforation, abscess or bowel obstruction.  Left-sided colostomy appears without evidence of complication by CT.  The  liver, gallbladder, pancreas, spleen, adrenal glands and kidneys are unremarkable.  The visualized lung bases show bibasilar atelectasis and a small left pleural effusion.  No masses or enlarged lymph nodes are identified.  The bladder is unremarkable.  Sagittal reconstructions shows stable mild compression of the superior endplate of T12.  IMPRESSION: No acute abnormalities identified by CT.  Left-sided colostomy appears uncomplicated.  Original Report Authenticated By: Reola Calkins, M.D.   Ct Abdomen Pelvis W Contrast  07/30/2011  *RADIOLOGY REPORT*  Clinical Data: Abdominal pain  CT ABDOMEN AND PELVIS WITH CONTRAST  Technique:  Multidetector CT imaging of the abdomen and pelvis was performed following the standard protocol during bolus administration of intravenous contrast.  Contrast: OMNIPAQUE IOHEXOL 300 MG/ML  SOLN  Comparison: 07/29/2011 radiograph, 05/25/2008 CT  Findings: Fibrotic changes at the bases.  Coronary artery calcification.  Hepatic steatosis, distended gallbladder.  No radiodense gallstones.  No pericholecystic fluid.  No biliary ductal dilatation.  Unremarkable spleen.  Unremarkable pancreas and adrenal glands.  Symmetric renal enhancement.  No hydronephrosis or hydroureter.  There is a segment of sigmoid colonic wall thickening, with pericolonic fat stranding and fluid as well as extraluminal air. No bowel obstruction.  The appendix is not identified.  Reactive sized mesenteric and retroperitoneal lymph nodes.  There is scattered atherosclerotic calcification of the aorta and its branches. No aneurysmal dilatation.  Retroaortic left renal vein.  Thin-walled bladder.  Absent uterus.  No adnexal mass.  Osteopenia.  No acute osseous finding.  Mild height loss at T12 with a prominent Schmorl's node.  L5 S1 degenerative disc disease. Marked sigmoid colonic wall thickening and a has a given the  IMPRESSION: Thickened segment of sigmoid colon with pericolonic fat stranding as well as  extraluminal air and fluid, in keeping with perforation. The underlying process is favored to be a colitis (ischemic, infectious, or inflammatory) or diverticulitis.  Appendix not identified.  There is fluid and air in the right paracolic gutter, presumably tracking upward from the sigmoid colonic process however correlate with surgical history.  Discussed via telephone with Dr. Rosalia Hammers at 03:30 a.m. on 07/30/2011.  Original Report Authenticated By: Waneta Martins, M.D.   Dg Abd Acute W/chest  08/17/2011  *RADIOLOGY REPORT*  Clinical Data: Nausea and vomiting and abdominal pain for 3 days. Colostomy placed 07/30/2011  ACUTE ABDOMEN SERIES (ABDOMEN 2 VIEW & CHEST 1 VIEW)  Comparison: CT 07/30/2011  Findings: Left basilar scarring or atelectasis.  Heart size is normal.  No free air beneath the diaphragms.  A few prominent mid abdominal small bowel loops are noted with maximal caliber 2.4 cm. No colonic dilatation.  No abnormal differential air-fluid level.  No abnormal calcification.  Osseous structures are grossly unremarkable.  IMPRESSION: No evidence for free air or bowel obstruction.  A few prominent mid abdominal small bowel loops are nonspecific.  Left basilar scarring or atelectasis.  Original Report Authenticated By: Harrel Lemon, M.D.   Dg Abd Acute W/chest  07/30/2011  *RADIOLOGY REPORT*  Clinical Data: Abdominal pain, vomiting.  ACUTE ABDOMEN SERIES (ABDOMEN 2 VIEW & CHEST 1 VIEW)  Comparison: 01/31/2008  Findings: The lungs are clear.  Heart is normal size.  No effusions or acute bony abnormality.  Moderate stool throughout the colon.  Nonobstructive bowel gas pattern.  No free air.  No organomegaly or suspicious calcification.  No acute bony abnormality.  IMPRESSION: Moderate stool burden throughout the colon.  No acute findings.  Original Report Authenticated By: Cyndie Chime, M.D.     Review of Systems  Constitutional: Negative for fever, chills and diaphoresis.  HENT: Negative for ear  pain, sore throat and trouble swallowing.   Eyes: Negative for photophobia and visual disturbance.  Respiratory: Positive for shortness of breath. Negative for apnea, cough, choking and chest tightness.        Using oxygen (was not preop)  Cardiovascular: Negative for chest pain and palpitations.  Gastrointestinal: Negative for nausea, vomiting, abdominal pain, diarrhea, constipation, anal bleeding and rectal pain.  Genitourinary: Negative for dysuria, frequency and difficulty urinating.  Musculoskeletal: Negative for myalgias and gait problem.  Skin: Negative for color change, pallor and rash.  Neurological: Negative for dizziness, speech difficulty, weakness and numbness.  Hematological: Negative for adenopathy.  Psychiatric/Behavioral: Negative for confusion and agitation. The patient is not nervous/anxious.        Objective:   Physical Exam  Constitutional: She is oriented to person, place, and time. She appears well-developed and well-nourished. No distress.  HENT:  Head: Normocephalic.  Mouth/Throat: Oropharynx is clear and moist. No oropharyngeal exudate.  Eyes: EOM are normal. Pupils are equal, round, and reactive to light. No scleral icterus.  Neck: Normal range of motion. No tracheal deviation present.  Cardiovascular: Normal rate and intact distal pulses.   Pulmonary/Chest: Effort normal. No respiratory distress. She exhibits no tenderness.  Abdominal: Soft. She exhibits no distension. There is no tenderness. There is no CVA tenderness. No hernia. Hernia confirmed negative in the ventral area, confirmed negative in the right inguinal area and confirmed negative in the left inguinal area.         Incisions clean with normal healing ridges.  No hernias  Genitourinary: No vaginal discharge found.  Musculoskeletal: Normal range of motion. She exhibits no tenderness.  Lymphadenopathy:       Right: No inguinal adenopathy present.       Left: No inguinal adenopathy present.    Neurological: She is alert and oriented to person, place, and time. No cranial nerve deficit. She exhibits normal muscle tone. Coordination normal.  Skin: Skin is warm and dry. No rash noted. She is not diaphoretic.  Psychiatric: Her speech is normal and behavior is normal. Her affect is not angry. She is not withdrawn. She exhibits a depressed mood.       Assessment:     3 month status post emergent colectomy for perforated diverticulitis in the setting of chronic constipation.  Recovering      Plan:     Increase activity as tolerated.  Do not push through pain.  Agree with aggressive rehabilitation  Advanced on diet as tolerated. Bowel regimen to avoid problems.  I strongly recommend she take one item.  She will try MiraLAX.  I noted she can go from a half dose to six doses a day.  Goal is one soft bowel movement a day.  It is a gradual process that takes several months to fully get a new level of function.  I think she expressed understandingConsider supplemental shakes to help improve nutrition.  Avoid constipation  Consider lap-assisted colostomy takedown.  She is hesitant to do this right now.  I noted there was no rush.  The anatomy & physiology of the digestive tract was discussed.  The pathophysiology was discussed.  Possibility of remaining with an ostomy permanently was discussed.  I offered ostomy takedown.  Laparoscopic & open techniques were discussed.   Risks such as bleeding, infection, abscess, leak, reoperation, possible re-ostomy, injury to other organs, hernia, heart attack, death, and other risks were discussed.   I noted a good likelihood this will help address the problem.  Goals of post-operative recovery were discussed as well.  We will work to minimize complications.  Questions were answered.  The patient expresses understanding & wishes to proceed with surgery.  In the meantime get a colonoscopy to rule out other possible etiologies for persistent constipation.  Is  five years from last one.  I strongly recommend quit smoking.  I will not do colostomy  takedown until she fully stops.  Best way to minimize her risk.  Followup primary care physician for medical clearance & re-evaluation of health.  She is overdue to visit with him anyway

## 2011-10-27 NOTE — Telephone Encounter (Signed)
Patient needs a follow up (five year) colonoscopy with Dr Ewing Schlein. Spoke to Hanna with Dr Ewing Schlein. Patient needs to contact billing department at [phone] before they can see her for follow up colonoscopy. [name] for patient to call back and ask for me to let her know. Awaiting patient's call.

## 2011-10-27 NOTE — Telephone Encounter (Signed)
CSW placed call to pt's home number.  Left message with contact information and hours.  Requesting return call to discuss [facility] referral.

## 2011-10-27 NOTE — Patient Instructions (Addendum)
Smoking  Colonoscopy to rule out other problems her persistent constipation  See primary care physician for followup and medical clearance you wish to have colostomy takedown.  GETTING TO GOOD BOWEL HEALTH. Irregular bowel habits such as constipation and diarrhea can lead to many problems over time.  Having one soft bowel movement a day is the most important way to prevent further problems.  The anorectal canal is designed to handle stretching and feces to safely manage our ability to get rid of solid waste (feces, poop, stool) out of our body.  BUT, hard constipated stools can act like ripping concrete bricks and diarrhea can be a burning fire to this very sensitive area of our body, causing inflamed hemorrhoids, anal fissures, increasing risk is perirectal abscesses, abdominal pain/bloating, an making irritable bowel worse.     The goal: ONE SOFT BOWEL MOVEMENT A DAY!  To have soft, regular bowel movements:    Drink at least 8 tall glasses of water a day.     Take plenty of fiber.  Fiber is the undigested part of plant food that passes into the colon, acting s "natures broom" to encourage bowel motility and movement.  Fiber can absorb and hold large amounts of water. This results in a larger, bulkier stool, which is soft and easier to pass. Work gradually over several weeks up to 6 servings a day of fiber (25g a day even more if needed) in the form of: o Vegetables -- Root (potatoes, carrots, turnips), leafy green (lettuce, salad greens, celery, spinach), or cooked high residue (cabbage, broccoli, etc) o Fruit -- Fresh (unpeeled skin & pulp), Dried (prunes, apricots, cherries, etc ),  or stewed ( applesauce)  o Whole grain breads, pasta, etc (whole wheat)  o Bran cereals    Bulking Agents -- This type of water-retaining fiber generally is easily obtained each day by one of the following:  o Psyllium bran -- The psyllium plant is remarkable because its ground seeds can retain so much water. This  product is available as Metamucil, Konsyl, Effersyllium, Per Diem Fiber, or the less expensive generic preparation in drug and health food stores. Although labeled a laxative, it really is not a laxative.  o Methylcellulose -- This is another fiber derived from wood which also retains water. It is available as Citrucel. o Polyethylene Glycol - and "artificial" fiber commonly called Miralax or Glycolax.  It is helpful for people with gassy or bloated feelings with regular fiber o Flax Seed - a less gassy fiber than psyllium   No reading or other relaxing activity while on the toilet. If bowel movements take longer than 5 minutes, you are too constipated   AVOID CONSTIPATION.  High fiber and water intake usually takes care of this.  Sometimes a laxative is needed to stimulate more frequent bowel movements, but    Laxatives are not a good long-term solution as it can wear the colon out. o Osmotics (Milk of Magnesia, Fleets phosphosoda, Magnesium citrate, MiraLax, GoLytely) are safer than  o Stimulants (Senokot, Castor Oil, Dulcolax, Ex Lax)    o Do not take laxatives for more than 7days in a row.    IF SEVERELY CONSTIPATED, try a Bowel Retraining Program: o Do not use laxatives.  o Eat a diet high in roughage, such as bran cereals and leafy vegetables.  o Drink six (6) ounces of prune or apricot juice each morning.  o Eat two (2) large servings of stewed fruit each day.  o Take one (  1) heaping tablespoon of a psyllium-based bulking agent twice a day. Use sugar-free sweetener when possible to avoid excessive calories.  o Eat a normal breakfast.  o Set aside 15 minutes after breakfast to sit on the toilet, but do not strain to have a bowel movement.  o If you do not have a bowel movement by the third day, use an enema and repeat the above steps.    Controlling diarrhea o Switch to liquids and simpler foods for a few days to avoid stressing your intestines further. o Avoid dairy products (especially  milk & ice cream) for a short time.  The intestines often can lose the ability to digest lactose when stressed. o Avoid foods that cause gassiness or bloating.  Typical foods include beans and other legumes, cabbage, broccoli, and dairy foods.  Every person has some sensitivity to other foods, so listen to our body and avoid those foods that trigger problems for you. o Adding fiber (Citrucel, Metamucil, psyllium, Miralax) gradually can help thicken stools by absorbing excess fluid and retrain the intestines to act more normally.  Slowly increase the dose over a few weeks.  Too much fiber too soon can backfire and cause cramping & bloating. o Probiotics (such as active yogurt, Align, etc) may help repopulate the intestines and colon with normal bacteria and calm down a sensitive digestive tract.  Most studies show it to be of mild help, though, and such products can be costly. o Medicines:   Bismuth subsalicylate (ex. Kayopectate, Pepto Bismol) every 30 minutes for up to 6 doses can help control diarrhea.  Avoid if pregnant.   Loperamide (Immodium) can slow down diarrhea.  Start with two tablets (4mg  total) first and then try one tablet every 6 hours.  Avoid if you are having fevers or severe pain.  If you are not better or start feeling worse, stop all medicines and call your doctor for advice o Call your doctor if you are getting worse or not better.  Sometimes further testing (cultures, endoscopy, X-ray studies, bloodwork, etc) may be needed to help diagnose and treat the cause of the diarrhea.   Ostomy Support Information An ostomy is an opening in the belly (abdominal wall) made by surgery. Ostomates are people who have had this procedure. The opening (stoma) allows the kidney or bowel to discharge waste. An external pouch covers the stoma to collect waste. Pouches are are a simple bag and are odor free. Different companies have disposable or reusable pouches to fit one's lifestyle. An ostomy can  either be temporary or permanent.  THERE ARE THREE MAIN TYPES OF OSTOMIES  Colostomy. A colostomy is a surgically created opening in the large intestine (colon).  Ileostomy. An ileostomy is a surgically created opening in the small intestine.  Urostomy. A urostomy is a surgically created opening to divert urine away from the bladder. FREQUENTLY ASKED QUESTIONS Will I need to be on a special diet? Most people return to their normal diet when they have recovered from surgery. Be sure to chew your food well, eat a well-balanced diet and drink plenty of fluids. If you experience problems with a certain food, wait a couple of weeks and try it again. Will there be odor and noises? Pouching systems are designed to be odor-proof or odor-resistant. There are deodorants that can be used in the pouch. Medications are also available to help reduce odor. Limit gas-producing foods and carbonated beverages. You will experience less gas and fewer noises as you heal  from surgery. How much time will it take to care for my ostomy? At first, you may spend a lot of time learning about your ostomy and how to take care of it. As you become more comfortable and skilled at changing the pouching system, it will take very little time to care for it.  Will I be able to return to work? People with ostomies can perform most jobs. As soon as you have healed from surgery, you should be able to return to work. Heavy lifting (more than 10 pounds) may be discouraged.  What about intimacy? Sexual relationships and intimacy are important and fulfilling aspects of your life. They should continue after ostomy surgery. Intimacy-related concerns should be discussed openly between you and your partner.  Can I wear regular clothing? You do not need to wear special clothing. Ostomy pouches are fairly flat and barely noticeable. Elastic undergarments will not hurt the stoma or prevent the ostomy from functioning.  Can I participate in  sports? An ostomy should not limit your involvement in sports. Many people with ostomies are runners, skiers, swimmers or participate in other active lifestyles. Talk with your caregiver first before doing heavy physical activity. PROFESSIONAL HELP  Resources are available if you need help or have questions about your ostomy.   Specially trained nurses called Wound, Ostomy Continence Nurses (WOCN) are available for consultation in most major medical centers.  The [employer] ([employer]) is a group made up of many local chapters throughout the [place]. These local groups hold meetings and provide support to prospective and existing ostomates. They sponsor educational events and have qualified visitors to make personal or telephone visits. Contact the [employer]OA for the chapter nearest you and for other educational publications.  More detailed information can be found in Colostomy Guide, a publication of the [employer] (UOA). Contact [employer] at 1-843-306-8711 or visit their web site at YellowSpecialist.at. The website contains links to other sites, suppliers and resources. Document Released: 01/15/2003 Document Revised: 04/06/2011 Document Reviewed: 05/16/2008 [facility] Patient Information 2013 [place], [place].

## 2011-10-28 DIAGNOSIS — Z433 Encounter for attention to colostomy: Secondary | ICD-10-CM | POA: Diagnosis not present

## 2011-10-28 DIAGNOSIS — K5732 Diverticulitis of large intestine without perforation or abscess without bleeding: Secondary | ICD-10-CM | POA: Diagnosis not present

## 2011-10-28 DIAGNOSIS — K631 Perforation of intestine (nontraumatic): Secondary | ICD-10-CM | POA: Diagnosis not present

## 2011-10-28 DIAGNOSIS — M549 Dorsalgia, unspecified: Secondary | ICD-10-CM | POA: Diagnosis not present

## 2011-10-28 DIAGNOSIS — J449 Chronic obstructive pulmonary disease, unspecified: Secondary | ICD-10-CM | POA: Diagnosis not present

## 2011-10-28 DIAGNOSIS — F329 Major depressive disorder, single episode, unspecified: Secondary | ICD-10-CM | POA: Diagnosis not present

## 2011-10-28 NOTE — Telephone Encounter (Signed)
[name] for patient to call me back 

## 2011-10-29 DIAGNOSIS — K631 Perforation of intestine (nontraumatic): Secondary | ICD-10-CM | POA: Diagnosis not present

## 2011-10-29 DIAGNOSIS — Z433 Encounter for attention to colostomy: Secondary | ICD-10-CM | POA: Diagnosis not present

## 2011-10-29 DIAGNOSIS — J449 Chronic obstructive pulmonary disease, unspecified: Secondary | ICD-10-CM | POA: Diagnosis not present

## 2011-10-29 DIAGNOSIS — M549 Dorsalgia, unspecified: Secondary | ICD-10-CM | POA: Diagnosis not present

## 2011-10-29 DIAGNOSIS — F329 Major depressive disorder, single episode, unspecified: Secondary | ICD-10-CM | POA: Diagnosis not present

## 2011-10-29 DIAGNOSIS — K5732 Diverticulitis of large intestine without perforation or abscess without bleeding: Secondary | ICD-10-CM | POA: Diagnosis not present

## 2011-10-30 NOTE — Telephone Encounter (Signed)
Have tried to reach patient several times about contacting financial department at [facility] gastroenterology prior to them scheduling her for repeat colonoscopy. Awaiting patient's call.

## 2011-11-03 DIAGNOSIS — K5732 Diverticulitis of large intestine without perforation or abscess without bleeding: Secondary | ICD-10-CM | POA: Diagnosis not present

## 2011-11-03 DIAGNOSIS — J449 Chronic obstructive pulmonary disease, unspecified: Secondary | ICD-10-CM | POA: Diagnosis not present

## 2011-11-03 DIAGNOSIS — Z433 Encounter for attention to colostomy: Secondary | ICD-10-CM | POA: Diagnosis not present

## 2011-11-03 DIAGNOSIS — M549 Dorsalgia, unspecified: Secondary | ICD-10-CM | POA: Diagnosis not present

## 2011-11-03 DIAGNOSIS — K631 Perforation of intestine (nontraumatic): Secondary | ICD-10-CM | POA: Diagnosis not present

## 2011-11-03 DIAGNOSIS — F329 Major depressive disorder, single episode, unspecified: Secondary | ICD-10-CM | POA: Diagnosis not present

## 2011-11-06 ENCOUNTER — Other Ambulatory Visit: Payer: Self-pay | Admitting: *Deleted

## 2011-11-06 DIAGNOSIS — K219 Gastro-esophageal reflux disease without esophagitis: Secondary | ICD-10-CM

## 2011-11-06 MED ORDER — ESOMEPRAZOLE MAGNESIUM 40 MG PO CPDR: 40.0000 mg | DELAYED_RELEASE_CAPSULE | Freq: Two times a day (BID) | ORAL | Status: DC

## 2011-11-10 DIAGNOSIS — J449 Chronic obstructive pulmonary disease, unspecified: Secondary | ICD-10-CM | POA: Diagnosis not present

## 2011-11-10 DIAGNOSIS — K631 Perforation of intestine (nontraumatic): Secondary | ICD-10-CM | POA: Diagnosis not present

## 2011-11-10 DIAGNOSIS — F329 Major depressive disorder, single episode, unspecified: Secondary | ICD-10-CM | POA: Diagnosis not present

## 2011-11-10 DIAGNOSIS — M549 Dorsalgia, unspecified: Secondary | ICD-10-CM | POA: Diagnosis not present

## 2011-11-10 DIAGNOSIS — K5732 Diverticulitis of large intestine without perforation or abscess without bleeding: Secondary | ICD-10-CM | POA: Diagnosis not present

## 2011-11-10 DIAGNOSIS — Z433 Encounter for attention to colostomy: Secondary | ICD-10-CM | POA: Diagnosis not present

## 2011-11-11 DIAGNOSIS — K5732 Diverticulitis of large intestine without perforation or abscess without bleeding: Secondary | ICD-10-CM | POA: Diagnosis not present

## 2011-11-11 DIAGNOSIS — M549 Dorsalgia, unspecified: Secondary | ICD-10-CM | POA: Diagnosis not present

## 2011-11-11 DIAGNOSIS — F329 Major depressive disorder, single episode, unspecified: Secondary | ICD-10-CM | POA: Diagnosis not present

## 2011-11-11 DIAGNOSIS — Z433 Encounter for attention to colostomy: Secondary | ICD-10-CM | POA: Diagnosis not present

## 2011-11-11 DIAGNOSIS — K631 Perforation of intestine (nontraumatic): Secondary | ICD-10-CM | POA: Diagnosis not present

## 2011-11-11 DIAGNOSIS — J449 Chronic obstructive pulmonary disease, unspecified: Secondary | ICD-10-CM | POA: Diagnosis not present

## 2011-11-17 DIAGNOSIS — F329 Major depressive disorder, single episode, unspecified: Secondary | ICD-10-CM | POA: Diagnosis not present

## 2011-11-17 DIAGNOSIS — K631 Perforation of intestine (nontraumatic): Secondary | ICD-10-CM | POA: Diagnosis not present

## 2011-11-17 DIAGNOSIS — Z433 Encounter for attention to colostomy: Secondary | ICD-10-CM | POA: Diagnosis not present

## 2011-11-17 DIAGNOSIS — J449 Chronic obstructive pulmonary disease, unspecified: Secondary | ICD-10-CM | POA: Diagnosis not present

## 2011-11-17 DIAGNOSIS — M549 Dorsalgia, unspecified: Secondary | ICD-10-CM | POA: Diagnosis not present

## 2011-11-17 DIAGNOSIS — K5732 Diverticulitis of large intestine without perforation or abscess without bleeding: Secondary | ICD-10-CM | POA: Diagnosis not present

## 2011-11-19 DIAGNOSIS — M549 Dorsalgia, unspecified: Secondary | ICD-10-CM | POA: Diagnosis not present

## 2011-11-19 DIAGNOSIS — K5732 Diverticulitis of large intestine without perforation or abscess without bleeding: Secondary | ICD-10-CM | POA: Diagnosis not present

## 2011-11-19 DIAGNOSIS — F329 Major depressive disorder, single episode, unspecified: Secondary | ICD-10-CM | POA: Diagnosis not present

## 2011-11-19 DIAGNOSIS — K631 Perforation of intestine (nontraumatic): Secondary | ICD-10-CM | POA: Diagnosis not present

## 2011-11-19 DIAGNOSIS — Z433 Encounter for attention to colostomy: Secondary | ICD-10-CM | POA: Diagnosis not present

## 2011-11-19 DIAGNOSIS — J449 Chronic obstructive pulmonary disease, unspecified: Secondary | ICD-10-CM | POA: Diagnosis not present

## 2011-11-24 DIAGNOSIS — F329 Major depressive disorder, single episode, unspecified: Secondary | ICD-10-CM | POA: Diagnosis not present

## 2011-11-24 DIAGNOSIS — M549 Dorsalgia, unspecified: Secondary | ICD-10-CM | POA: Diagnosis not present

## 2011-11-24 DIAGNOSIS — K631 Perforation of intestine (nontraumatic): Secondary | ICD-10-CM | POA: Diagnosis not present

## 2011-11-24 DIAGNOSIS — Z433 Encounter for attention to colostomy: Secondary | ICD-10-CM | POA: Diagnosis not present

## 2011-11-24 DIAGNOSIS — J449 Chronic obstructive pulmonary disease, unspecified: Secondary | ICD-10-CM | POA: Diagnosis not present

## 2011-11-24 DIAGNOSIS — K5732 Diverticulitis of large intestine without perforation or abscess without bleeding: Secondary | ICD-10-CM | POA: Diagnosis not present

## 2011-11-25 ENCOUNTER — Ambulatory Visit (INDEPENDENT_AMBULATORY_CARE_PROVIDER_SITE_OTHER): Payer: Medicare Other | Admitting: Internal Medicine

## 2011-11-25 ENCOUNTER — Other Ambulatory Visit: Payer: Self-pay | Admitting: Internal Medicine

## 2011-11-25 ENCOUNTER — Encounter: Payer: Self-pay | Admitting: Internal Medicine

## 2011-11-25 VITALS — BP 101/58 | HR 101 | Temp 97.8°F | Ht 64.0 in | Wt 148.9 lb

## 2011-11-25 DIAGNOSIS — K5720 Diverticulitis of large intestine with perforation and abscess without bleeding: Secondary | ICD-10-CM

## 2011-11-25 DIAGNOSIS — K5732 Diverticulitis of large intestine without perforation or abscess without bleeding: Secondary | ICD-10-CM | POA: Diagnosis not present

## 2011-11-25 DIAGNOSIS — F17200 Nicotine dependence, unspecified, uncomplicated: Secondary | ICD-10-CM | POA: Diagnosis not present

## 2011-11-25 DIAGNOSIS — J449 Chronic obstructive pulmonary disease, unspecified: Secondary | ICD-10-CM | POA: Diagnosis not present

## 2011-11-25 DIAGNOSIS — G894 Chronic pain syndrome: Secondary | ICD-10-CM

## 2011-11-25 MED ORDER — MORPHINE SULFATE ER 15 MG PO TBCR: 15.0000 mg | EXTENDED_RELEASE_TABLET | Freq: Two times a day (BID) | ORAL | Status: DC

## 2011-11-25 MED ORDER — NICOTINE POLACRILEX 4 MG MT GUM: 4.0000 mg | CHEWING_GUM | OROMUCOSAL | Status: DC | PRN

## 2011-11-25 MED ORDER — LIDOCAINE 5 % EX PTCH: 1.0000 | MEDICATED_PATCH | Freq: Every day | CUTANEOUS | Status: DC

## 2011-11-25 MED ORDER — LIDOCAINE 5 % EX PTCH: 1.0000 | MEDICATED_PATCH | Freq: Two times a day (BID) | CUTANEOUS | Status: DC

## 2011-11-25 MED ORDER — HYDROCODONE-ACETAMINOPHEN 7.5-325 MG PO TABS: 1.0000 | ORAL_TABLET | Freq: Four times a day (QID) | ORAL | Status: DC | PRN

## 2011-11-25 NOTE — Assessment & Plan Note (Signed)
The patient underwent colostomy placement for perforated diverticulitis.  I appreciate the excellent care given by Dr. Michaell Cowing and his team.  Currently planning for colostomy take-down.  The planned procedure is an intermediate risk surgery.  The patient's risk of perioperative cardiac complications is 0.4% by the revised Goldman cardiac risk index scoring system (0 risk factors), as the patient has no history of CHF, CAD, DM, CKD, or CVA.  The patient does not require pre-op EKG, given no history of CAD, stroke, or PVD.  The patient does not require pre-op echocardiogram, and echo from 2009 shows EF 65-75% with no wall motion abnormalities, though some increased LV filling pressure.  The patient has a history of smoking, with COPD GOLD stage 2, currently not requiring use of inhalers.  To decrease her pulmonary risk, I recommend quitting smoking completely, ideally for 2 months prior to the planned surgery.  In summary, the patient will undergo intermediate risk surgery, with a low (0.4%) risk of perioperative cardiac complications.  To further decrease the patient's pulmonary risk, I recommend smoking cessation from now until the time of surgery.

## 2011-11-25 NOTE — Assessment & Plan Note (Signed)
Patient informed that smoking cessation was a pre-requisite for colostomy take-down.  Patient has already cut back to about 1 cig/day. -will start nicotine gum prn for cravings

## 2011-11-25 NOTE — Assessment & Plan Note (Signed)
The patient's chronic pain is difficult to address.  Her opiate usage has increased from approx 37.5 mg morphine equivalent/day to 67.5 mg morphine equivalent/day since my last visit with her due to the addition of MSIR, and she notes that her pain is still not well controlled.  Complicating matters, she is experiencing significant side effects of constipation, concerning given her recent colonic perforation.  The patient also notes that her pharmacist is worried that she may be overdosing on tylenol unintentionally. -stop MSIR -start MS contin 15 mg BID -change vicodin from 7.5-750 to 7.5-325 for breakthrough pain -patient may continue to use OTC tylenol regular strength for breakthrough pain, educated patient about not exceeding 3500 mg tylenol/day (to avoid absolute max of 4g/day) -prescribed lidocaine patches for topical back pain relief -recommend starting fiber supplement (benefiber, etc.) as additional stool softener
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
## 2011-11-25 NOTE — Patient Instructions (Addendum)
For your chronic pain, we are changing your pain medication regimen. -start taking MS Contin (long-acting morphine), 15 mg twice per day every day -for breakthrough pain, continue to take Vicodin 7.5-325 mg up to every 6 hours for pain -we are prescribing lidocaine patches for your back pain.  Apply 1 patch daily to the affected area of your back.  Do not leave the patch on your skin for more than 12 hours -you may also continue to take tylenol.  We have decreased the amount of tylenol in your vicodin.  Each vicodin has 325 mg of tylenol in the tablet.  Take no more than 3500 mg of tylenol in 1 day (about 11 tablets of regular strength tylenol or vicodin)  For your constipation, continue taking Docusate (colace) twice per day, and miralax daily. -add a fiber supplement (benefiber, metamucil, etc.) to your regimen as a stool bulking agent.  To help you stop smoking, we are prescribing nicorette gum.  Chew 1 piece of gum every time you have a craving.  We are sending a letter to your surgeon outlining your risks of surgery.  Please return for a follow-up visit in 1-2 months.

## 2011-11-30 DIAGNOSIS — M549 Dorsalgia, unspecified: Secondary | ICD-10-CM | POA: Diagnosis not present

## 2011-11-30 DIAGNOSIS — K631 Perforation of intestine (nontraumatic): Secondary | ICD-10-CM | POA: Diagnosis not present

## 2011-11-30 DIAGNOSIS — J449 Chronic obstructive pulmonary disease, unspecified: Secondary | ICD-10-CM | POA: Diagnosis not present

## 2011-11-30 DIAGNOSIS — Z433 Encounter for attention to colostomy: Secondary | ICD-10-CM | POA: Diagnosis not present

## 2011-11-30 DIAGNOSIS — K5732 Diverticulitis of large intestine without perforation or abscess without bleeding: Secondary | ICD-10-CM | POA: Diagnosis not present

## 2011-11-30 DIAGNOSIS — F329 Major depressive disorder, single episode, unspecified: Secondary | ICD-10-CM | POA: Diagnosis not present

## 2011-12-01 DIAGNOSIS — Z433 Encounter for attention to colostomy: Secondary | ICD-10-CM | POA: Diagnosis not present

## 2011-12-01 DIAGNOSIS — F329 Major depressive disorder, single episode, unspecified: Secondary | ICD-10-CM | POA: Diagnosis not present

## 2011-12-01 DIAGNOSIS — K5732 Diverticulitis of large intestine without perforation or abscess without bleeding: Secondary | ICD-10-CM | POA: Diagnosis not present

## 2011-12-01 DIAGNOSIS — K631 Perforation of intestine (nontraumatic): Secondary | ICD-10-CM | POA: Diagnosis not present

## 2011-12-01 DIAGNOSIS — J449 Chronic obstructive pulmonary disease, unspecified: Secondary | ICD-10-CM | POA: Diagnosis not present

## 2011-12-01 DIAGNOSIS — M549 Dorsalgia, unspecified: Secondary | ICD-10-CM | POA: Diagnosis not present

## 2011-12-11 DIAGNOSIS — M549 Dorsalgia, unspecified: Secondary | ICD-10-CM | POA: Diagnosis not present

## 2011-12-11 DIAGNOSIS — Z433 Encounter for attention to colostomy: Secondary | ICD-10-CM | POA: Diagnosis not present

## 2011-12-11 DIAGNOSIS — F329 Major depressive disorder, single episode, unspecified: Secondary | ICD-10-CM | POA: Diagnosis not present

## 2011-12-11 DIAGNOSIS — J449 Chronic obstructive pulmonary disease, unspecified: Secondary | ICD-10-CM | POA: Diagnosis not present

## 2011-12-11 DIAGNOSIS — K631 Perforation of intestine (nontraumatic): Secondary | ICD-10-CM | POA: Diagnosis not present

## 2011-12-11 DIAGNOSIS — K5732 Diverticulitis of large intestine without perforation or abscess without bleeding: Secondary | ICD-10-CM | POA: Diagnosis not present

## 2011-12-17 DIAGNOSIS — Z433 Encounter for attention to colostomy: Secondary | ICD-10-CM | POA: Diagnosis not present

## 2011-12-17 DIAGNOSIS — J449 Chronic obstructive pulmonary disease, unspecified: Secondary | ICD-10-CM | POA: Diagnosis not present

## 2011-12-17 DIAGNOSIS — F329 Major depressive disorder, single episode, unspecified: Secondary | ICD-10-CM | POA: Diagnosis not present

## 2011-12-17 DIAGNOSIS — K5732 Diverticulitis of large intestine without perforation or abscess without bleeding: Secondary | ICD-10-CM | POA: Diagnosis not present

## 2011-12-17 DIAGNOSIS — M549 Dorsalgia, unspecified: Secondary | ICD-10-CM | POA: Diagnosis not present

## 2011-12-17 DIAGNOSIS — K631 Perforation of intestine (nontraumatic): Secondary | ICD-10-CM | POA: Diagnosis not present

## 2011-12-21 DIAGNOSIS — K5732 Diverticulitis of large intestine without perforation or abscess without bleeding: Secondary | ICD-10-CM | POA: Diagnosis not present

## 2011-12-21 DIAGNOSIS — K631 Perforation of intestine (nontraumatic): Secondary | ICD-10-CM | POA: Diagnosis not present

## 2011-12-21 DIAGNOSIS — F329 Major depressive disorder, single episode, unspecified: Secondary | ICD-10-CM | POA: Diagnosis not present

## 2011-12-21 DIAGNOSIS — J449 Chronic obstructive pulmonary disease, unspecified: Secondary | ICD-10-CM | POA: Diagnosis not present

## 2011-12-21 DIAGNOSIS — Z433 Encounter for attention to colostomy: Secondary | ICD-10-CM | POA: Diagnosis not present

## 2011-12-21 DIAGNOSIS — M549 Dorsalgia, unspecified: Secondary | ICD-10-CM | POA: Diagnosis not present

## 2011-12-30 DIAGNOSIS — Z433 Encounter for attention to colostomy: Secondary | ICD-10-CM | POA: Diagnosis not present

## 2011-12-30 DIAGNOSIS — J449 Chronic obstructive pulmonary disease, unspecified: Secondary | ICD-10-CM | POA: Diagnosis not present

## 2011-12-30 DIAGNOSIS — K631 Perforation of intestine (nontraumatic): Secondary | ICD-10-CM | POA: Diagnosis not present

## 2011-12-30 DIAGNOSIS — M549 Dorsalgia, unspecified: Secondary | ICD-10-CM | POA: Diagnosis not present

## 2011-12-30 DIAGNOSIS — F329 Major depressive disorder, single episode, unspecified: Secondary | ICD-10-CM | POA: Diagnosis not present

## 2011-12-30 DIAGNOSIS — K5732 Diverticulitis of large intestine without perforation or abscess without bleeding: Secondary | ICD-10-CM | POA: Diagnosis not present

## 2011-12-31 ENCOUNTER — Encounter: Payer: Medicare Other | Admitting: Internal Medicine

## 2012-01-14 ENCOUNTER — Encounter: Payer: Self-pay | Admitting: Internal Medicine

## 2012-01-14 ENCOUNTER — Ambulatory Visit (INDEPENDENT_AMBULATORY_CARE_PROVIDER_SITE_OTHER): Payer: Medicare Other | Admitting: Internal Medicine

## 2012-01-14 VITALS — BP 132/65 | HR 95 | Temp 98.1°F | Ht 64.0 in | Wt 156.2 lb

## 2012-01-14 DIAGNOSIS — G894 Chronic pain syndrome: Secondary | ICD-10-CM

## 2012-01-14 DIAGNOSIS — H3530 Unspecified macular degeneration: Secondary | ICD-10-CM | POA: Diagnosis not present

## 2012-01-14 DIAGNOSIS — Z0000 Encounter for general adult medical examination without abnormal findings: Secondary | ICD-10-CM | POA: Insufficient documentation

## 2012-01-14 DIAGNOSIS — Z515 Encounter for palliative care: Secondary | ICD-10-CM | POA: Insufficient documentation

## 2012-01-14 DIAGNOSIS — F17200 Nicotine dependence, unspecified, uncomplicated: Secondary | ICD-10-CM | POA: Diagnosis not present

## 2012-01-14 DIAGNOSIS — Z23 Encounter for immunization: Secondary | ICD-10-CM | POA: Diagnosis not present

## 2012-01-14 MED ORDER — CYCLOBENZAPRINE HCL 5 MG PO TABS: 5.0000 mg | ORAL_TABLET | Freq: Three times a day (TID) | ORAL | Status: DC | PRN

## 2012-01-14 MED ORDER — PROMETHAZINE HCL 12.5 MG PO TABS: 25.0000 mg | ORAL_TABLET | Freq: Four times a day (QID) | ORAL | Status: DC | PRN

## 2012-01-14 MED ORDER — BUPROPION HCL ER (SR) 150 MG PO TB12: 150.0000 mg | ORAL_TABLET | Freq: Two times a day (BID) | ORAL | Status: DC

## 2012-01-14 NOTE — Assessment & Plan Note (Signed)
The patient notes symptoms suggestive of macular degeneration, as well as generalized blurry vision, which may benefit from an updated glasses prescription.  Patient has seen Dr. Dione Booze in the past -referral to ophtho

## 2012-01-14 NOTE — Assessment & Plan Note (Addendum)
The patient continues to note chronic back pain, despite adding MS contin at her last visit.  I'm glad her constipation has improved.  However, I'm still concerned her narcotics are giving her side effects of nausea. -keep narcotics at current dosage -add flexeril 5 TID prn as an adjuvant agent -supportive care to back wounds, which do not appear infected -patient cautioned about use of overly hot materials on her back

## 2012-01-14 NOTE — Assessment & Plan Note (Signed)
The patient still wants to quit smoking, but continues to smoke 2 cigs/day, with triggers of social outings with her friends twice per day.  Patient is already on Wellbutrin 100 mg TID. -change bupropion to SR 150 mg BID, which is the most well-studied dose for smoking cessation -patient given information on the quitline -patient referred to SW, seemed very excited about this idea.

## 2012-01-14 NOTE — Patient Instructions (Signed)
General Instructions: For smoking cessation, we are making a referral to Lynnae January, our Social Worker, to discuss smoking cessation tips. -we are also changing your Bupropion medication to a form that has been shown to be useful in smoking cessation.  Take 1 SR tablet (150 mg) twice per day  For your back pain, we are adding the muscle relaxant Flexeril.  Take 1 tablet up to 3 times per day as needed for back pain.  We have sent a referral to Dr. Dione Booze for an eye exam for blurry vision.  Please return for a follow-up visit in about 3-4 months, or sooner if needed.   Treatment Goals:  Goals (1 Years of Data) as of 01/14/2012    None      Progress Toward Treatment Goals:  Treatment Goal 01/14/2012  Stop smoking smoking more    Self Care Goals & Plans:  Self Care Goal 01/14/2012  Be physically active take a walk every day  Stop smoking call [facility] (1-800-QUIT-NOW); set a quit date and stop smoking       Care Management & Community Referrals:  Referral 01/14/2012  Referrals made for care management support social worker
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
## 2012-01-14 NOTE — Assessment & Plan Note (Signed)
Flu vaccine and pneumovax given today

## 2012-01-18 DIAGNOSIS — Z961 Presence of intraocular lens: Secondary | ICD-10-CM | POA: Diagnosis not present

## 2012-01-18 DIAGNOSIS — H3530 Unspecified macular degeneration: Secondary | ICD-10-CM | POA: Diagnosis not present

## 2012-01-21 DIAGNOSIS — Z961 Presence of intraocular lens: Secondary | ICD-10-CM | POA: Diagnosis not present

## 2012-01-21 DIAGNOSIS — H353290 Exudative age-related macular degeneration, unspecified eye, stage unspecified: Secondary | ICD-10-CM | POA: Diagnosis not present

## 2012-01-21 DIAGNOSIS — H353190 Nonexudative age-related macular degeneration, unspecified eye, stage unspecified: Secondary | ICD-10-CM | POA: Diagnosis not present

## 2012-01-28 DIAGNOSIS — J449 Chronic obstructive pulmonary disease, unspecified: Secondary | ICD-10-CM | POA: Diagnosis not present

## 2012-01-28 DIAGNOSIS — K631 Perforation of intestine (nontraumatic): Secondary | ICD-10-CM | POA: Diagnosis not present

## 2012-01-28 DIAGNOSIS — M549 Dorsalgia, unspecified: Secondary | ICD-10-CM | POA: Diagnosis not present

## 2012-01-28 DIAGNOSIS — K5732 Diverticulitis of large intestine without perforation or abscess without bleeding: Secondary | ICD-10-CM | POA: Diagnosis not present

## 2012-01-28 DIAGNOSIS — F329 Major depressive disorder, single episode, unspecified: Secondary | ICD-10-CM | POA: Diagnosis not present

## 2012-01-28 DIAGNOSIS — Z433 Encounter for attention to colostomy: Secondary | ICD-10-CM | POA: Diagnosis not present

## 2012-01-30 DIAGNOSIS — K5732 Diverticulitis of large intestine without perforation or abscess without bleeding: Secondary | ICD-10-CM | POA: Diagnosis not present

## 2012-01-30 DIAGNOSIS — H3530 Unspecified macular degeneration: Secondary | ICD-10-CM | POA: Diagnosis not present

## 2012-01-30 DIAGNOSIS — K631 Perforation of intestine (nontraumatic): Secondary | ICD-10-CM | POA: Diagnosis not present

## 2012-01-30 DIAGNOSIS — M549 Dorsalgia, unspecified: Secondary | ICD-10-CM | POA: Diagnosis not present

## 2012-01-30 DIAGNOSIS — Z433 Encounter for attention to colostomy: Secondary | ICD-10-CM | POA: Diagnosis not present

## 2012-01-30 DIAGNOSIS — F329 Major depressive disorder, single episode, unspecified: Secondary | ICD-10-CM | POA: Diagnosis not present

## 2012-01-30 DIAGNOSIS — J449 Chronic obstructive pulmonary disease, unspecified: Secondary | ICD-10-CM | POA: Diagnosis not present

## 2012-02-09 ENCOUNTER — Telehealth: Payer: Self-pay | Admitting: Licensed Clinical Social Worker

## 2012-02-09 NOTE — Telephone Encounter (Signed)
[name] Bell was referred to CSW for smoking cessation.  During most recent [facility] appt, pt has reduced the number of cigarettes and expresses desire to quit.  CSW placed call to [name] Bell.  Pt states she had eye surgery on 02/04/12 and has not smoked since.  Pt states she does not want to irritate her eyes with smoke.  [name] Bell states she has increased her eating and "nibbles a lot".  CSW offered to send information on behavioral techniques to replace habit.  Pt agreeable.  CSW mailed information with letter.

## 2012-02-10 ENCOUNTER — Ambulatory Visit (INDEPENDENT_AMBULATORY_CARE_PROVIDER_SITE_OTHER): Payer: Medicare Other | Admitting: Internal Medicine

## 2012-02-10 ENCOUNTER — Encounter: Payer: Self-pay | Admitting: Internal Medicine

## 2012-02-10 VITALS — BP 117/61 | HR 108 | Temp 98.1°F | Ht 64.0 in | Wt 154.8 lb

## 2012-02-10 DIAGNOSIS — F17200 Nicotine dependence, unspecified, uncomplicated: Secondary | ICD-10-CM

## 2012-02-10 DIAGNOSIS — F329 Major depressive disorder, single episode, unspecified: Secondary | ICD-10-CM | POA: Diagnosis not present

## 2012-02-10 DIAGNOSIS — L988 Other specified disorders of the skin and subcutaneous tissue: Secondary | ICD-10-CM | POA: Diagnosis not present

## 2012-02-10 DIAGNOSIS — G894 Chronic pain syndrome: Secondary | ICD-10-CM | POA: Diagnosis not present

## 2012-02-10 DIAGNOSIS — R238 Other skin changes: Secondary | ICD-10-CM

## 2012-02-10 DIAGNOSIS — H3530 Unspecified macular degeneration: Secondary | ICD-10-CM

## 2012-02-10 MED ORDER — ACYCLOVIR 400 MG PO TABS: 400.0000 mg | ORAL_TABLET | Freq: Every day | ORAL | Status: DC

## 2012-02-10 MED ORDER — MORPHINE SULFATE ER 15 MG PO TBCR: 15.0000 mg | EXTENDED_RELEASE_TABLET | Freq: Two times a day (BID) | ORAL | Status: DC

## 2012-02-10 MED ORDER — BUPROPION HCL 100 MG PO TABS: 100.0000 mg | ORAL_TABLET | Freq: Three times a day (TID) | ORAL | Status: DC

## 2012-02-10 MED ORDER — HYDROCODONE-ACETAMINOPHEN 7.5-325 MG PO TABS: 1.0000 | ORAL_TABLET | Freq: Four times a day (QID) | ORAL | Status: DC | PRN

## 2012-02-10 NOTE — Assessment & Plan Note (Signed)
Patient wants to quit smoking.  She has now quit for 3 days, due to ocular irritation from smoke.  I congratulated patient, and encouraged her to keep with her goal of smoking cessation. -patient to follow-up with CSW, greatly appreciate assistance
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
## 2012-02-10 NOTE — Assessment & Plan Note (Signed)
Mood has worsened since stopping bupropion at her last visit (due to inability to swallow new ER pills). -switch back to old prescription of bupropion 100 mg TID tabs
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
## 2012-02-10 NOTE — Assessment & Plan Note (Signed)
The patient notes that she is scheduled to start ocular injections for macular degeneration tomorrow. -greatly appreciate the help of ophtho specialists with this case

## 2012-02-10 NOTE — Patient Instructions (Signed)
General Instructions: We are continuing your pain medications at their current dosage.  For the pain in your left nostril, apply vasoline 1-2 times per day as needed to the inner nostril.  The painful spots on your chin may represent cold sores.  These will likely resolve on their own, but you can make them resolve more quickly with Acyclovir, 1 tablet 5 times per day for 5 days, if you chose to do so.  Please return for a follow-up visit in 3 months.   Treatment Goals:  Goals (1 Years of Data) as of 02/10/2012    None      Progress Toward Treatment Goals:  Treatment Goal 01/14/2012  Stop smoking smoking more    Self Care Goals & Plans:  Self Care Goal 01/14/2012  Be physically active take a walk every day  Stop smoking call [facility] (1-800-QUIT-NOW); set a quit date and stop smoking       Care Management & Community Referrals:  Referral 01/14/2012  Referrals made for care management support social worker
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
## 2012-02-11 DIAGNOSIS — H353290 Exudative age-related macular degeneration, unspecified eye, stage unspecified: Secondary | ICD-10-CM | POA: Diagnosis not present

## 2012-02-19 DIAGNOSIS — K631 Perforation of intestine (nontraumatic): Secondary | ICD-10-CM | POA: Diagnosis not present

## 2012-02-19 DIAGNOSIS — Z433 Encounter for attention to colostomy: Secondary | ICD-10-CM | POA: Diagnosis not present

## 2012-02-19 DIAGNOSIS — J449 Chronic obstructive pulmonary disease, unspecified: Secondary | ICD-10-CM | POA: Diagnosis not present

## 2012-02-19 DIAGNOSIS — F329 Major depressive disorder, single episode, unspecified: Secondary | ICD-10-CM | POA: Diagnosis not present

## 2012-02-19 DIAGNOSIS — M549 Dorsalgia, unspecified: Secondary | ICD-10-CM | POA: Diagnosis not present

## 2012-02-19 DIAGNOSIS — K5732 Diverticulitis of large intestine without perforation or abscess without bleeding: Secondary | ICD-10-CM | POA: Diagnosis not present

## 2012-02-26 DIAGNOSIS — J449 Chronic obstructive pulmonary disease, unspecified: Secondary | ICD-10-CM | POA: Diagnosis not present

## 2012-02-26 DIAGNOSIS — F329 Major depressive disorder, single episode, unspecified: Secondary | ICD-10-CM | POA: Diagnosis not present

## 2012-02-26 DIAGNOSIS — K631 Perforation of intestine (nontraumatic): Secondary | ICD-10-CM | POA: Diagnosis not present

## 2012-02-26 DIAGNOSIS — K5732 Diverticulitis of large intestine without perforation or abscess without bleeding: Secondary | ICD-10-CM | POA: Diagnosis not present

## 2012-02-26 DIAGNOSIS — Z433 Encounter for attention to colostomy: Secondary | ICD-10-CM | POA: Diagnosis not present

## 2012-02-26 DIAGNOSIS — M549 Dorsalgia, unspecified: Secondary | ICD-10-CM | POA: Diagnosis not present

## 2012-03-03 DIAGNOSIS — F329 Major depressive disorder, single episode, unspecified: Secondary | ICD-10-CM | POA: Diagnosis not present

## 2012-03-03 DIAGNOSIS — K631 Perforation of intestine (nontraumatic): Secondary | ICD-10-CM | POA: Diagnosis not present

## 2012-03-03 DIAGNOSIS — M549 Dorsalgia, unspecified: Secondary | ICD-10-CM | POA: Diagnosis not present

## 2012-03-03 DIAGNOSIS — J449 Chronic obstructive pulmonary disease, unspecified: Secondary | ICD-10-CM | POA: Diagnosis not present

## 2012-03-03 DIAGNOSIS — K5732 Diverticulitis of large intestine without perforation or abscess without bleeding: Secondary | ICD-10-CM | POA: Diagnosis not present

## 2012-03-03 DIAGNOSIS — Z433 Encounter for attention to colostomy: Secondary | ICD-10-CM | POA: Diagnosis not present

## 2012-03-18 DIAGNOSIS — H35059 Retinal neovascularization, unspecified, unspecified eye: Secondary | ICD-10-CM | POA: Diagnosis not present

## 2012-03-29 ENCOUNTER — Telehealth (INDEPENDENT_AMBULATORY_CARE_PROVIDER_SITE_OTHER): Payer: Self-pay

## 2012-03-29 NOTE — Telephone Encounter (Signed)
Office calling to inform us that the pt does not keep her appt with their office.

## 2012-04-13 ENCOUNTER — Other Ambulatory Visit: Payer: Self-pay | Admitting: *Deleted

## 2012-04-13 NOTE — Telephone Encounter (Signed)
review 

## 2012-04-21 DIAGNOSIS — H353290 Exudative age-related macular degeneration, unspecified eye, stage unspecified: Secondary | ICD-10-CM | POA: Diagnosis not present

## 2012-04-21 DIAGNOSIS — Z961 Presence of intraocular lens: Secondary | ICD-10-CM | POA: Diagnosis not present

## 2012-04-21 DIAGNOSIS — H31019 Macula scars of posterior pole (postinflammatory) (post-traumatic), unspecified eye: Secondary | ICD-10-CM | POA: Diagnosis not present

## 2012-04-21 DIAGNOSIS — H353190 Nonexudative age-related macular degeneration, unspecified eye, stage unspecified: Secondary | ICD-10-CM | POA: Diagnosis not present

## 2012-05-11 ENCOUNTER — Encounter: Payer: Self-pay | Admitting: Internal Medicine

## 2012-05-11 ENCOUNTER — Ambulatory Visit (INDEPENDENT_AMBULATORY_CARE_PROVIDER_SITE_OTHER): Payer: Medicare Other | Admitting: Internal Medicine

## 2012-05-11 VITALS — BP 100/70 | HR 70 | Temp 98.0°F | Ht 64.0 in | Wt 156.8 lb

## 2012-05-11 DIAGNOSIS — E785 Hyperlipidemia, unspecified: Secondary | ICD-10-CM

## 2012-05-11 DIAGNOSIS — G894 Chronic pain syndrome: Secondary | ICD-10-CM | POA: Diagnosis not present

## 2012-05-11 DIAGNOSIS — E538 Deficiency of other specified B group vitamins: Secondary | ICD-10-CM

## 2012-05-11 DIAGNOSIS — F329 Major depressive disorder, single episode, unspecified: Secondary | ICD-10-CM | POA: Diagnosis not present

## 2012-05-11 DIAGNOSIS — F17200 Nicotine dependence, unspecified, uncomplicated: Secondary | ICD-10-CM

## 2012-05-11 LAB — BASIC METABOLIC PANEL
Chloride: 102 mEq/L (ref 96–112)
Potassium: 4.4 mEq/L (ref 3.5–5.3)
Sodium: 137 mEq/L (ref 135–145)

## 2012-05-11 MED ORDER — PROMETHAZINE HCL 12.5 MG PO TABS: 12.5000 mg | ORAL_TABLET | Freq: Four times a day (QID) | ORAL | Status: DC | PRN

## 2012-05-11 MED ORDER — METHOCARBAMOL 750 MG PO TABS: 750.0000 mg | ORAL_TABLET | Freq: Four times a day (QID) | ORAL | Status: DC | PRN

## 2012-05-11 NOTE — Patient Instructions (Signed)
General Instructions: We are checking your potassium levels today, and will call you if any of the results are abnormal.  For your ongoing colostomy care, we will help you make a follow-up appointment with Dr. Alexandria Lodge.  For your back spasms, since your insurance will no longer cover flexeril, we are prescribing robaxin.  Take 1 tablet up to 4 times per day as needed for pain.  Please return for a follow-up visit in 6 months.  Treatment Goals:  Goals (1 Years of Data) as of 05/11/12   None      Progress Toward Treatment Goals:  Treatment Goal 05/11/2012  Stop smoking smoking the same amount    Self Care Goals & Plans:  Self Care Goal 01/14/2012  Be physically active take a walk every day  Stop smoking call [facility] (1-800-QUIT-NOW); set a quit date and stop smoking       Care Management & Community Referrals:  Referral 05/11/2012  Referrals made for care management support none needed

## 2012-05-11 NOTE — Assessment & Plan Note (Signed)
At our last visit, the patient's mood was low due to being off of wellbutrin, but her mood hasn't significantly improved after restarting this medication.  We discussed changing to a different antidepressant, but she is hesitant to make this change.  We will continue to address at future visits.

## 2012-05-11 NOTE — Assessment & Plan Note (Signed)
The patient has significantly cut back on her cigarette usage.  However she still notes occasional cravings which lead her to smoke.  She still expresses a commitment to tobacco cessation.  She declines CSW referral or nicotine replacement products.  Will continue to follow-up.

## 2012-05-11 NOTE — Progress Notes (Signed)
HPI The patient is a 67 y.o. female with a history of HL, chronic pain, osteoporosis, COPD, presenting for a routine follow-up visit.  The patient has been getting ocular injections for macular degeneration, though she notes only minimal subjective improvement in vision.  The patient has a history of chronic pain.  Flexeril was added as an adjuvant agent.  Her insurance will no longer cover flexeril.  She asks for an alternative.  The patient has re-started wellbutrin since our last visit, but still notes low mood.  She notes sleeping more than usual.  She notes low energy, decreased concentration, and feelings of guilt.  She is resistant to the idea of changing her antidepressant.  At our last visit, the patient had quit smoking for 3 days.  Now, she notes that she had 1 cigarette yesterday, but she still wants to quit.    ROS: General: no fevers, chills, changes in weight, changes in appetite Skin: no rash HEENT: no hearing changes, sore throat Pulm: no dyspnea, coughing, wheezing CV: no chest pain, palpitations, shortness of breath Abd: no abdominal pain, nausea/vomiting, diarrhea/constipation GU: no dysuria, hematuria, polyuria Ext: no arthralgias, myalgias Neuro: no weakness, numbness, or tingling  Filed Vitals:   05/11/12 1336  BP: 100/70  Pulse: 70  Temp: 98 F (36.7 C)    PEX General: alert, cooperative, and in no apparent distress HEENT: pupils equal round and reactive to light, vision grossly intact, oropharynx clear and non-erythematous  Neck: supple, no lymphadenopathy Lungs: clear to ascultation bilaterally, normal work of respiration, no wheezes, rales, ronchi Heart: regular rate and rhythm, no murmurs, gallops, or rubs Abdomen: soft, non-tender, non-distended, normal bowel sounds, ostomy in place with no surrounding erythema, but with mild surrounding fullness, suggestive of early hernia formation Extremities: no cyanosis, clubbing, or edema Neurologic: alert &  oriented X3, cranial nerves II-XII intact, strength grossly intact, sensation intact to light touch  Current Outpatient Prescriptions on File Prior to Visit  Medication Sig Dispense Refill  . acyclovir (ZOVIRAX) 400 MG tablet Take 1 tablet (400 mg total) by mouth 5 (five) times daily.  25 tablet  0  . buPROPion (WELLBUTRIN) 100 MG tablet Take 1 tablet (100 mg total) by mouth 3 (three) times daily.  90 tablet  5  . calcium-vitamin D (OSCAL WITH D 500-200) 500-200 MG-UNIT per tablet Take 1 tablet by mouth 3 (three) times daily.        . cyclobenzaprine (FLEXERIL) 5 MG tablet Take 1 tablet (5 mg total) by mouth 3 (three) times daily as needed for muscle spasms.  90 tablet  3  . docusate sodium (COLACE) 100 MG capsule Take 100 mg by mouth daily as needed. For shortness of breath      . esomeprazole (NEXIUM) 40 MG capsule Take 1 capsule (40 mg total) by mouth 2 (two) times daily.  180 capsule  3  . HYDROcodone-acetaminophen (NORCO) 7.5-325 MG per tablet Take 1 tablet by mouth every 6 (six) hours as needed for pain.  150 tablet  5  . lidocaine (LIDODERM) 5 % Place 1 patch onto the skin daily. Do not leave patch on skin for more than 12 hours at a time  30 patch  2  . loratadine-pseudoephedrine (CLARITIN-D 12 HOUR) 5-120 MG per tablet Take 1 tablet by mouth 2 (two) times daily.  30 tablet  0  . morphine (MS CONTIN) 15 MG 12 hr tablet Take 1 tablet (15 mg total) by mouth every 12 (twelve) hours.  60 tablet  0  . nicotine polacrilex (NICORETTE) 4 MG gum Take 1 each (4 mg total) by mouth as needed for smoking cessation.  100 tablet  2  . pravastatin (PRAVACHOL) 80 MG tablet Take 1 tablet (80 mg total) by mouth daily.  30 tablet  11  . promethazine (PHENERGAN) 12.5 MG tablet Take 2 tablets (25 mg total) by mouth every 6 (six) hours as needed.  90 tablet  3   No current facility-administered medications on file prior to visit.    Assessment/Plan

## 2012-05-11 NOTE — Assessment & Plan Note (Signed)
The patient notes that flexeril is no longer covered by her insurance. -substitute with robaxin 750 mg qid prn -continue current narcotic regimen

## 2012-05-17 ENCOUNTER — Telehealth: Payer: Self-pay | Admitting: *Deleted

## 2012-05-17 NOTE — Telephone Encounter (Signed)
Call to [facility] for Prior Authorization for Methocarbamol 750 mg tablets.  Prior Authorization was approved  05/17/2012 thru 05/17/2013.  Angelina Ok, RN 05/17/2012 11:37 AM.

## 2012-05-24 ENCOUNTER — Encounter (INDEPENDENT_AMBULATORY_CARE_PROVIDER_SITE_OTHER): Payer: Medicare Other | Admitting: Surgery

## 2012-05-25 ENCOUNTER — Other Ambulatory Visit: Payer: Self-pay | Admitting: *Deleted

## 2012-05-25 MED ORDER — MORPHINE SULFATE ER 15 MG PO TBCR: 15.0000 mg | EXTENDED_RELEASE_TABLET | Freq: Two times a day (BID) | ORAL | Status: DC

## 2012-05-26 MED ORDER — MORPHINE SULFATE ER 15 MG PO TBCR: 15.0000 mg | EXTENDED_RELEASE_TABLET | Freq: Two times a day (BID) | ORAL | Status: DC

## 2012-05-26 NOTE — Addendum Note (Signed)
Addended by: Linward Headland on: 05/26/2012 01:05 PM   Modules accepted: Orders, Medications

## 2012-05-26 NOTE — Telephone Encounter (Signed)
MS Contin cannot be phone in Thanks

## 2012-05-27 NOTE — Telephone Encounter (Signed)
Pt picked up rx yesterday.

## 2012-05-30 ENCOUNTER — Encounter (INDEPENDENT_AMBULATORY_CARE_PROVIDER_SITE_OTHER): Payer: Self-pay | Admitting: Surgery

## 2012-05-30 ENCOUNTER — Ambulatory Visit (INDEPENDENT_AMBULATORY_CARE_PROVIDER_SITE_OTHER): Payer: Medicare Other | Admitting: Surgery

## 2012-05-30 VITALS — BP 120/72 | HR 113 | Temp 98.9°F | Resp 18 | Ht 64.0 in | Wt 157.4 lb

## 2012-05-30 DIAGNOSIS — G894 Chronic pain syndrome: Secondary | ICD-10-CM

## 2012-05-30 DIAGNOSIS — Z933 Colostomy status: Secondary | ICD-10-CM | POA: Diagnosis not present

## 2012-05-30 DIAGNOSIS — K5732 Diverticulitis of large intestine without perforation or abscess without bleeding: Secondary | ICD-10-CM | POA: Diagnosis not present

## 2012-05-30 DIAGNOSIS — F17200 Nicotine dependence, unspecified, uncomplicated: Secondary | ICD-10-CM

## 2012-05-30 DIAGNOSIS — K5720 Diverticulitis of large intestine with perforation and abscess without bleeding: Secondary | ICD-10-CM

## 2012-05-30 NOTE — Progress Notes (Signed)
Subjective:     Patient ID: [name] Bell, female   DOB: October 05, 1945, 67 y.o.   MRN: [id number]  HPI   [name] Bell  01-05-1946 [id number]  Patient Care Team: Linward Headland, MD as PCP - General (Internal Medicine) Petra Kuba, MD as Consulting Physician (Gastroenterology)  This patient is a 67 y.o.female who presents today for surgical evaluation  Procedure: Laparoscopic lysis of adhesions and rectal sigmoid resection with end colostomy 07/30/2011.   REPORT OF SURGICAL PATHOLOGY FINAL DIAGNOSIS Diagnosis Colon, segmental resection, Recto-Sigmoid - BENIGN COLONIC DIVERTICULA WITH TRANSMURAL ACUTE AND CHRONIC INFLAMMATION, ABSCESS AND SEROSITIS. - NEGATIVE FOR DYSPLASIA OR MALIGNANCY. [place] RUND DO Pathologist, Electronic Signature  Patient comes in for concerns of pain around her ostomy.  She was worried about a possible hernia.  She takes MiraLAX daily and drinks or liquids.  Her constipation is under much better control.  Emptying her colostomy every day.  No fevers or chills.  She has much better.  Having an ostomy over straining on the toilet for hours with her chronic constipation.  Still is on chronic pain meds for her chronic back and abdominal pain.  Abdomen is diffusely uncomfortable.  Colostomy not necessarily more sensitive than anywhere else.  No fevers or chills.  She stop smoking.  She thinks she has lost a little bit of weight.  No fevers or chills.  Overall she is happy that she is alive and gradually improving   Patient Active Problem List   Diagnosis Date Noted  . Diverticulitis of colon with perforation 07/30/2011    Priority: Medium  . Macular degeneration 01/14/2012  . Preventative health care 01/14/2012  . COPD (chronic obstructive pulmonary disease) 11/25/2011  . Colostomy in place 10/27/2011  . Physical deconditioning 08/26/2011  . Constipation 07/17/2010  . HYPERLIPIDEMIA 01/24/2009  . Chronic pain syndrome 01/03/2009  . DEPRESSION 03/08/2008  .  TEMPOROMANDIBULAR JOINT PAIN 12/01/2007  . OSTEOPOROSIS 03/03/2007  . ACUTE DUODENAL ULCER W/HEMORRHAGE&OBSTRUCTION 12/28/2006  . TOBACCO ABUSE 12/02/2006  . VITAMIN B12 DEFICIENCY 11/03/2006    Past Medical History  Diagnosis Date  . Superior mesenteric artery syndrome 11/08    sp dilation by Dr. Ewing Schlein, Pt may require bowel resetion if sx recur  . Duodenal ulcer 11/08    With hemorrhage and obstruction  . Osteomyelitis, jaw acute 11/08    Started while in the hospital abcess showed GNR . SP debridement by Dr. Neoma Laming,  [name] Bell sp 4  weeks of Pen V started on 05/19/07  with additional 2 weeks in 07/04/07  . Degenerative disk disease     This is interscapular, mild compression frx of T12 superior endplate with Schmorl's node. Seen on CT in 2/08  . Back pain   . Depression   . Acute sinusitis 06/30/2011  . Ischemic colitis 07/30/2011    2009 by endoscopy   . Perforated bowel     surgery july 2013    Past Surgical History  Procedure Laterality Date  . Abdominal hysterectomy    . Colostomy  07/30/2011    Procedure: COLOSTOMY;  Surgeon: Ardeth Sportsman, MD;  Location: [facility] OR;  Service: General;  Laterality: Left;  . Lysis of adhesions  07/30/2011  . Sigmoid colectomy  07/30/2011  . Transrectal drainage of pelvic abscess  07/30/2011    History   Social History  . Marital Status: Divorced    Spouse Name: N/A    Number of Children: N/A  . Years of Education: N/A  Occupational History  . Not on file.   Social History Main Topics  . Smoking status: Current Some Day Smoker -- 0.20 packs/day for 20 years    Types: Cigarettes  . Smokeless tobacco: Not on file     Comment: 2 cigs/day  . Alcohol Use: No  . Drug Use: No  . Sexually Active: Not on file   Other Topics Concern  . Not on file   Social History Narrative   Pt now lives by herself in an ALF [facility]) where she can come and go as she pleases.  A good friend named [name] still looks out for her daily.     Family  History  Problem Relation Age of Onset  . Cancer Mother   . Heart disease Father   . Diabetes Sister     Current Outpatient Prescriptions  Medication Sig Dispense Refill  . buPROPion (WELLBUTRIN) 100 MG tablet Take 1 tablet (100 mg total) by mouth 3 (three) times daily.  90 tablet  5  . calcium-vitamin D (OSCAL WITH D 500-200) 500-200 MG-UNIT per tablet Take 1 tablet by mouth 3 (three) times daily.        Marland Kitchen docusate sodium (COLACE) 100 MG capsule Take 100 mg by mouth daily as needed. For shortness of breath      . esomeprazole (NEXIUM) 40 MG capsule Take 1 capsule (40 mg total) by mouth 2 (two) times daily.  180 capsule  3  . HYDROcodone-acetaminophen (NORCO) 7.5-325 MG per tablet Take 1 tablet by mouth every 6 (six) hours as needed for pain.  150 tablet  5  . methocarbamol (ROBAXIN) 750 MG tablet Take 1 tablet (750 mg total) by mouth 4 (four) times daily as needed.  120 tablet  1  . morphine (MS CONTIN) 15 MG 12 hr tablet Take 1 tablet (15 mg total) by mouth every 12 (twelve) hours.  60 tablet  0  . pravastatin (PRAVACHOL) 80 MG tablet Take 1 tablet (80 mg total) by mouth daily.  30 tablet  11  . promethazine (PHENERGAN) 12.5 MG tablet Take 1 tablet (12.5 mg total) by mouth every 6 (six) hours as needed.  90 tablet  3   No current facility-administered medications for this visit.     Allergies  Allergen Reactions  . Ibuprofen     REACTION: Bleeding ulcers  . Latex   . Nsaids     REACTION: Bleeding Ulcer    BP 120/72  Pulse 113  Temp(Src) 98.9 F (37.2 C) (Temporal)  Resp 18  Ht 5\' 4"  (1.626 m)  Wt 157 lb 6.4 oz (71.396 kg)  BMI 27 kg/m2  LMP 08/25/1980  Ct Abdomen Pelvis W Contrast  08/17/2011  *RADIOLOGY REPORT*  Clinical Data: Abdominal pain.  Recent left-sided colostomy with rectosigmoid resection on 07/30/2011 for sigmoid colonic perforation and peritonitis.  CT ABDOMEN AND PELVIS WITH CONTRAST  Technique:  Multidetector CT imaging of the abdomen and pelvis was  performed following the standard protocol during bolus administration of intravenous contrast.  Contrast: 80mL OMNIPAQUE IOHEXOL 300 MG/ML  SOLN  Comparison: 07/30/2011  Findings: There is no evidence of bowel perforation, abscess or bowel obstruction.  Left-sided colostomy appears without evidence of complication by CT.  The liver, gallbladder, pancreas, spleen, adrenal glands and kidneys are unremarkable.  The visualized lung bases show bibasilar atelectasis and a small left pleural effusion.  No masses or enlarged lymph nodes are identified.  The bladder is unremarkable.  Sagittal reconstructions shows stable mild compression  of the superior endplate of T12.  IMPRESSION: No acute abnormalities identified by CT.  Left-sided colostomy appears uncomplicated.  Original Report Authenticated By: Reola Calkins, M.D.   Ct Abdomen Pelvis W Contrast  07/30/2011  *RADIOLOGY REPORT*  Clinical Data: Abdominal pain  CT ABDOMEN AND PELVIS WITH CONTRAST  Technique:  Multidetector CT imaging of the abdomen and pelvis was performed following the standard protocol during bolus administration of intravenous contrast.  Contrast: OMNIPAQUE IOHEXOL 300 MG/ML  SOLN  Comparison: 07/29/2011 radiograph, 05/25/2008 CT  Findings: Fibrotic changes at the bases.  Coronary artery calcification.  Hepatic steatosis, distended gallbladder.  No radiodense gallstones.  No pericholecystic fluid.  No biliary ductal dilatation.  Unremarkable spleen.  Unremarkable pancreas and adrenal glands.  Symmetric renal enhancement.  No hydronephrosis or hydroureter.  There is a segment of sigmoid colonic wall thickening, with pericolonic fat stranding and fluid as well as extraluminal air. No bowel obstruction.  The appendix is not identified.  Reactive sized mesenteric and retroperitoneal lymph nodes.  There is scattered atherosclerotic calcification of the aorta and its branches. No aneurysmal dilatation.  Retroaortic left renal vein.  Thin-walled  bladder.  Absent uterus.  No adnexal mass.  Osteopenia.  No acute osseous finding.  Mild height loss at T12 with a prominent Schmorl's node.  L5 S1 degenerative disc disease. Marked sigmoid colonic wall thickening and a has a given the  IMPRESSION: Thickened segment of sigmoid colon with pericolonic fat stranding as well as extraluminal air and fluid, in keeping with perforation. The underlying process is favored to be a colitis (ischemic, infectious, or inflammatory) or diverticulitis.  Appendix not identified.  There is fluid and air in the right paracolic gutter, presumably tracking upward from the sigmoid colonic process however correlate with surgical history.  Discussed via telephone with Dr. Rosalia Hammers at 03:30 a.m. on 07/30/2011.  Original Report Authenticated By: Waneta Martins, M.D.   Dg Abd Acute W/chest  08/17/2011  *RADIOLOGY REPORT*  Clinical Data: Nausea and vomiting and abdominal pain for 3 days. Colostomy placed 07/30/2011  ACUTE ABDOMEN SERIES (ABDOMEN 2 VIEW & CHEST 1 VIEW)  Comparison: CT 07/30/2011  Findings: Left basilar scarring or atelectasis.  Heart size is normal.  No free air beneath the diaphragms.  A few prominent mid abdominal small bowel loops are noted with maximal caliber 2.4 cm. No colonic dilatation.  No abnormal differential air-fluid level.  No abnormal calcification.  Osseous structures are grossly unremarkable.  IMPRESSION: No evidence for free air or bowel obstruction.  A few prominent mid abdominal small bowel loops are nonspecific.  Left basilar scarring or atelectasis.  Original Report Authenticated By: Harrel Lemon, M.D.   Dg Abd Acute W/chest  07/30/2011  *RADIOLOGY REPORT*  Clinical Data: Abdominal pain, vomiting.  ACUTE ABDOMEN SERIES (ABDOMEN 2 VIEW & CHEST 1 VIEW)  Comparison: 01/31/2008  Findings: The lungs are clear.  Heart is normal size.  No effusions or acute bony abnormality.  Moderate stool throughout the colon.  Nonobstructive bowel gas pattern.  No  free air.  No organomegaly or suspicious calcification.  No acute bony abnormality.  IMPRESSION: Moderate stool burden throughout the colon.  No acute findings.  Original Report Authenticated By: Cyndie Chime, M.D.     Review of Systems  Constitutional: Negative for fever, chills and diaphoresis.  HENT: Negative for ear pain, sore throat and trouble swallowing.   Eyes: Negative for photophobia and visual disturbance.  Respiratory: Negative for apnea, cough, choking and chest tightness.  Cardiovascular: Negative for chest pain and palpitations.  Gastrointestinal: Positive for abdominal pain and constipation. Negative for nausea, vomiting, diarrhea, blood in stool, abdominal distention, anal bleeding and rectal pain.  Genitourinary: Negative for dysuria, frequency and difficulty urinating.  Musculoskeletal: Positive for myalgias, back pain and arthralgias. Negative for gait problem.  Skin: Negative for color change, pallor and rash.  Neurological: Negative for dizziness, speech difficulty, weakness and numbness.  Hematological: Negative for adenopathy.  Psychiatric/Behavioral: Negative for confusion and agitation. The patient is not nervous/anxious.        Objective:   Physical Exam  Constitutional: She is oriented to person, place, and time. She appears well-developed and well-nourished. No distress.  HENT:  Head: Normocephalic.  Mouth/Throat: Oropharynx is clear and moist. No oropharyngeal exudate.  Eyes: EOM are normal. Pupils are equal, round, and reactive to light. No scleral icterus.  Neck: Normal range of motion. No tracheal deviation present.  Cardiovascular: Normal rate and intact distal pulses.   Pulmonary/Chest: Effort normal. No respiratory distress. She exhibits no tenderness.  Abdominal: Soft. She exhibits no distension. There is no tenderness. There is no CVA tenderness. No hernia. Hernia confirmed negative in the ventral area, confirmed negative in the right inguinal  area and confirmed negative in the left inguinal area.    Incisions clean with normal healing ridges.  No hernias  Genitourinary: No vaginal discharge found.  Musculoskeletal: Normal range of motion. She exhibits no tenderness.  Lymphadenopathy:       Right: No inguinal adenopathy present.       Left: No inguinal adenopathy present.  Neurological: She is alert and oriented to person, place, and time. No cranial nerve deficit. She exhibits normal muscle tone. Coordination normal.  Skin: Skin is warm and dry. No rash noted. She is not diaphoretic.  Psychiatric: Her speech is normal and behavior is normal. Her affect is not angry. She is not withdrawn. She exhibits a depressed mood.       Assessment:     10 month status post emergent colectomy for perforated diverticulitis in the setting of chronic constipation.  Recovering   No evidence of major parastomal hernia.  Chronic constipation improved with MiraLAX bowel regimen.  Overdue for screening colonoscopy.    Plan:     Increase activity as tolerated.  Do not push through pain.  Agree with aggressive rehabilitation  High fiber diet as tolerated. Continue Miralax daily.  Avoid constipation  If develops parastomal hernia or worsening problems, consider lap-assisted colostomy takedown.  She prefers her ostomy.  She is happy avoid straining on the toilet for hours as she used to.  I will not override this.  In the meantime get a colonoscopy to rule out other possible etiologies for persistent constipation.  Is six years from last one.  She is still not interested.  I am glad that she did quit smoking.  I will not do colostomy takedown until she fully stops.  Best way to minimize her risk.  Followup primary care physician for medical clearance & re-evaluation of health.

## 2012-05-30 NOTE — Patient Instructions (Signed)
Ostomy Support Information  Yes, you've heard that people get along just fine with only one of their eyes, or one of their lungs, or one of their kidneys. But you also know that you have only one intestine and only one bladder, and that leaves you feeling awfully empty, both physically and emotionally: You think no other people go around without part of their intestine with the ends of their intestines sticking out through their abdominal walls.  Well, you are wrong! There are nearly three quarters of a million people in the [place] who have an ostomy; people who have had surgery to remove all or part of their colons or bladders. There is even a national association, the [place] [employer] [place] with over 350 local affiliated support groups that are organized by volunteers who provide peer support and counseling. [employer] has a toll free telephone num-ber, 671-503-7551 and an educational,  interactive website, www.ostomy.org   An ostomy is an opening in the belly (abdominal wall) made by surgery. Ostomates are people who have had this procedure. The opening (stoma) allows the kidney or bowel to discharge waste. An external pouch covers the stoma to collect waste. Pouches are are a simple bag and are odor free. Different companies have disposable or reusable pouches to fit one's lifestyle. An ostomy can either be temporary or permanent.  THERE ARE THREE MAIN TYPES OF OSTOMIES  Colostomy. A colostomy is a surgically created opening in the large intestine (colon).  Ileostomy. An ileostomy is a surgically created opening in the small intestine.  Urostomy. A urostomy is a surgically created opening to divert urine away from the bladder. FREQUENTLY ASKED QUESTIONS   Why haven't you met any of these folks who have an ostomy?  Well, maybe you have! You just did not recognize them because an ostomy doesn't show. It can be kept secret if you wish. Why, maybe some of your best friends, office associates  or neighbors have an ostomy ... you never can tell.   People facing ostomy surgery have many quality-of-life questions like:  Will you bulge? Smell? Make noises? Will you feel waste leaving your body? Will you be a captive of the toilet? Will you starve? Be a social outcast? Get/stay married? Have babies? Easily bathe, go swimming, bend over?  OK, let's look at what you can expect:  Will you bulge?  Remember, without part of the intestine or bladder, and its contents, you should have a flatter tummy than before. You can expect to wear, with little exception, what you wore before surgery ... and this in-cludes tight clothing and bathing suits.  Will you smell?  Today, thanks to modern odor proof pouching systems, you can walk into an ostomy support group meeting and not smell anything that is foul or offensive. And, for those with an ileostomy or colostomy who are concerned about odor when emptying their pouch, there are in-pouch deodorants that can be used to eliminate any waste odors that may exist.  Will you make noises?  Everyone produces gas, especially if they are an air-swallower. But intestinal sounds that occur from time to time are no differ-ent than a gurgling tummy, and quite often your clothing will muffle any sounds.   Will you feel the waste discharges?  For those with a colostomy or ileostomy there might be a slight pressure when waste leaves your body, but understand that the intestines have no nerve endings, so there will be no unpleasant sensations. Those with a urostomy will  probably be unaware of any kidney drainage.  Will you be a captive of the toilet?  Immediately post-op you will spend more time in the bathroom than you will after your body recovers from surgery. Every person is different, but on average those with an ileostomy or urostomy may empty their pouches 4 to 6 times a day; a little  less if you have a colostomy. The average wear time between pouch system changes is 3  to 5 days and the changing process should take less than 30 minutes.  Will I need to be on a special diet? Most people return to their normal diet when they have recovered from surgery. Be sure to chew your food well, eat a well-balanced diet and drink plenty of fluids. If you experience problems with a certain food, wait a couple of weeks and try it again. Will there be odor and noises? Pouching systems are designed to be odor-proof or odor-resistant. There are deodorants that can be used in the pouch. Medications are also available to help reduce odor. Limit gas-producing foods and carbonated beverages. You will experience less gas and fewer noises as you heal from surgery. How much time will it take to care for my ostomy? At first, you may spend a lot of time learning about your ostomy and how to take care of it. As you become more comfortable and skilled at changing the pouching system, it will take very little time to care for it.  Will I be able to return to work? People with ostomies can perform most jobs. As soon as you have healed from surgery, you should be able to return to work. Heavy lifting (more than 10 pounds) may be discouraged.  What about intimacy? Sexual relationships and intimacy are important and fulfilling aspects of your life. They should continue after ostomy surgery. Intimacy-related concerns should be discussed openly between you and your partner.  Can I wear regular clothing? You do not need to wear special clothing. Ostomy pouches are fairly flat and barely noticeable. Elastic undergarments will not hurt the stoma or prevent the ostomy from functioning.  Can I participate in sports? An ostomy should not limit your involvement in sports. Many people with ostomies are runners, skiers, swimmers or participate in other active lifestyles. Talk with your caregiver first before doing heavy physical activity.  Will you starve?  Not if you follow doctor's orders at each stage of  your post-op adjustment. There is no such thing as an "ostomy diet". Some people with an ostomy will be able to eat and tolerate anything; others may find diffi-culty with some foods. Each person is an individual and must determine, by trial, what is best for them. A good practice for all is to drink plenty of water.  Will you be a social outcast?  Have you met anyone who has an ostomy and is a social outcast? Why should you be the first? Only your attitude and self image will effect how you are treated. No confi-dent person is an Occupational psychologist.   PROFESSIONAL HELP  Resources are available if you need help or have questions about your ostomy.    Specially trained nurses called Wound, Ostomy Continence Nurses (WOCN) are available for consultation in most major medical centers.   Consider getting an ostomy consult with Cena Benton at [facility] to help troubleshoot collagen fittings and other issues with your ostomy: [phone]   The [place] (UOA) is a group made up of many local  chapters throughout the [place]. These local groups hold meetings and provide support to prospective and existing ostomates. They sponsor educational events and have qualified visitors to make personal or telephone visits. Contact the UOA for the chapter nearest you and for other educational publications.  More detailed information can be found in Colostomy Guide, a publication of the [employer] (UOA). Contact UOA at 1-680-780-7173 or visit their web site at YellowSpecialist.at. The website contains links to other sites, suppliers and resources. Document Released: 01/15/2003 Document Revised: 04/06/2011 Document Reviewed: 05/16/2008 [facility] Patient Information 2013 [place], [place].  Please consider getting a Colonoscopy A colonoscopy is an exam to evaluate your entire colon. In this exam, your colon is cleansed. A long fiberoptic tube is inserted through your rectum and into your  colon. The fiberoptic scope (endoscope) is a long bundle of enclosed and very flexible fibers. These fibers transmit light to the area examined and send images from that area to your caregiver. Discomfort is usually minimal. You may be given a drug to help you sleep (sedative) during or prior to the procedure. This exam helps to detect lumps (tumors), polyps, inflammation, and areas of bleeding. Your caregiver may also take a small piece of tissue (biopsy) that will be examined under a microscope. LET YOUR CAREGIVER KNOW ABOUT:   Allergies to food or medicine.  Medicines taken, including vitamins, herbs, eyedrops, over-the-counter medicines, and creams.  Use of steroids (by mouth or creams).  Previous problems with anesthetics or numbing medicines.  History of bleeding problems or blood clots.  Previous surgery.  Other health problems, including diabetes and kidney problems.  Possibility of pregnancy, if this applies. BEFORE THE PROCEDURE   A clear liquid diet may be required for 2 days before the exam.  Ask your caregiver about changing or stopping your regular medications.  Liquid injections (enemas) or laxatives may be required.  A large amount of electrolyte solution may be given to you to drink over a short period of time. This solution is used to clean out your colon.  You should be present 60 minutes prior to your procedure or as directed by your caregiver. AFTER THE PROCEDURE   If you received a sedative or pain relieving medication, you will need to arrange for someone to drive you home.  Occasionally, there is a little blood passed with the first bowel movement. Do not be concerned. FINDING OUT THE RESULTS OF YOUR TEST Not all test results are available during your visit. If your test results are not back during the visit, make an appointment with your caregiver to find out the results. Do not assume everything is normal if you have not heard from your caregiver or the  medical facility. It is important for you to follow up on all of your test results. HOME CARE INSTRUCTIONS   It is not unusual to pass moderate amounts of gas and experience mild abdominal cramping following the procedure. This is due to air being used to inflate your colon during the exam. Walking or a warm pack on your belly (abdomen) may help.  You may resume all normal meals and activities after sedatives and medicines have worn off.  Only take over-the-counter or prescription medicines for pain, discomfort, or fever as directed by your caregiver. Do not use aspirin or blood thinners if a biopsy was taken. Consult your caregiver for medicine usage if biopsies were taken. SEEK IMMEDIATE MEDICAL CARE IF:   You have a fever.  You pass large blood  clots or fill a toilet with blood following the procedure. This may also occur 10 to 14 days following the procedure. This is more likely if a biopsy was taken.  You develop abdominal pain that keeps getting worse and cannot be relieved with medicine. Document Released: 01/10/2000 Document Revised: 04/06/2011 Document Reviewed: 08/25/2007 [facility] Patient Information 2013 [place], [place].

## 2012-06-02 DIAGNOSIS — H353190 Nonexudative age-related macular degeneration, unspecified eye, stage unspecified: Secondary | ICD-10-CM | POA: Diagnosis not present

## 2012-06-02 DIAGNOSIS — H31019 Macula scars of posterior pole (postinflammatory) (post-traumatic), unspecified eye: Secondary | ICD-10-CM | POA: Diagnosis not present

## 2012-06-02 DIAGNOSIS — H353290 Exudative age-related macular degeneration, unspecified eye, stage unspecified: Secondary | ICD-10-CM | POA: Diagnosis not present

## 2012-06-02 DIAGNOSIS — Z961 Presence of intraocular lens: Secondary | ICD-10-CM | POA: Diagnosis not present

## 2012-06-18 ENCOUNTER — Other Ambulatory Visit: Payer: Self-pay | Admitting: Internal Medicine

## 2012-08-23 ENCOUNTER — Other Ambulatory Visit: Payer: Self-pay | Admitting: *Deleted

## 2012-08-24 MED ORDER — MORPHINE SULFATE ER 15 MG PO TBCR: 15.0000 mg | EXTENDED_RELEASE_TABLET | Freq: Two times a day (BID) | ORAL | Status: DC

## 2012-08-24 MED ORDER — METHOCARBAMOL 750 MG PO TABS: 750.0000 mg | ORAL_TABLET | Freq: Four times a day (QID) | ORAL | Status: DC | PRN

## 2012-08-24 NOTE — Telephone Encounter (Signed)
Rxs are ready - pt called.

## 2012-09-06 ENCOUNTER — Other Ambulatory Visit: Payer: Self-pay | Admitting: *Deleted

## 2012-09-06 MED ORDER — HYDROCODONE-ACETAMINOPHEN 7.5-325 MG PO TABS: 1.0000 | ORAL_TABLET | Freq: Four times a day (QID) | ORAL | Status: DC | PRN

## 2012-09-06 NOTE — Telephone Encounter (Signed)
Rx called in to pharmacy. 

## 2012-09-27 ENCOUNTER — Other Ambulatory Visit: Payer: Self-pay | Admitting: *Deleted

## 2012-09-28 ENCOUNTER — Inpatient Hospital Stay (HOSPITAL_COMMUNITY)
Admission: EM | Admit: 2012-09-28 | Discharge: 2012-10-02 | DRG: 872 | Disposition: A | Discharge status: Home / Self Care | Payer: Medicare Other | Attending: Internal Medicine | Admitting: Internal Medicine

## 2012-09-28 ENCOUNTER — Encounter (HOSPITAL_COMMUNITY): Payer: Self-pay | Admitting: Emergency Medicine

## 2012-09-28 DIAGNOSIS — R110 Nausea: Secondary | ICD-10-CM | POA: Diagnosis not present

## 2012-09-28 DIAGNOSIS — J449 Chronic obstructive pulmonary disease, unspecified: Secondary | ICD-10-CM | POA: Diagnosis present

## 2012-09-28 DIAGNOSIS — F17200 Nicotine dependence, unspecified, uncomplicated: Secondary | ICD-10-CM | POA: Diagnosis present

## 2012-09-28 DIAGNOSIS — E872 Acidosis: Secondary | ICD-10-CM | POA: Diagnosis not present

## 2012-09-28 DIAGNOSIS — Z933 Colostomy status: Secondary | ICD-10-CM

## 2012-09-28 DIAGNOSIS — K5732 Diverticulitis of large intestine without perforation or abscess without bleeding: Secondary | ICD-10-CM | POA: Diagnosis not present

## 2012-09-28 DIAGNOSIS — D72829 Elevated white blood cell count, unspecified: Secondary | ICD-10-CM | POA: Diagnosis present

## 2012-09-28 DIAGNOSIS — F3289 Other specified depressive episodes: Secondary | ICD-10-CM | POA: Diagnosis present

## 2012-09-28 DIAGNOSIS — K5289 Other specified noninfective gastroenteritis and colitis: Secondary | ICD-10-CM | POA: Diagnosis not present

## 2012-09-28 DIAGNOSIS — F329 Major depressive disorder, single episode, unspecified: Secondary | ICD-10-CM | POA: Diagnosis present

## 2012-09-28 DIAGNOSIS — A09 Infectious gastroenteritis and colitis, unspecified: Secondary | ICD-10-CM | POA: Diagnosis present

## 2012-09-28 DIAGNOSIS — Z79891 Long term (current) use of opiate analgesic: Secondary | ICD-10-CM | POA: Diagnosis present

## 2012-09-28 DIAGNOSIS — K5720 Diverticulitis of large intestine with perforation and abscess without bleeding: Secondary | ICD-10-CM | POA: Diagnosis present

## 2012-09-28 DIAGNOSIS — A419 Sepsis, unspecified organism: Principal | ICD-10-CM | POA: Diagnosis present

## 2012-09-28 DIAGNOSIS — J4489 Other specified chronic obstructive pulmonary disease: Secondary | ICD-10-CM | POA: Diagnosis present

## 2012-09-28 DIAGNOSIS — A0472 Enterocolitis due to Clostridium difficile, not specified as recurrent: Secondary | ICD-10-CM | POA: Diagnosis not present

## 2012-09-28 DIAGNOSIS — G894 Chronic pain syndrome: Secondary | ICD-10-CM | POA: Diagnosis present

## 2012-09-28 DIAGNOSIS — R109 Unspecified abdominal pain: Secondary | ICD-10-CM | POA: Diagnosis not present

## 2012-09-28 DIAGNOSIS — E785 Hyperlipidemia, unspecified: Secondary | ICD-10-CM | POA: Diagnosis present

## 2012-09-28 DIAGNOSIS — K2970 Gastritis, unspecified, without bleeding: Secondary | ICD-10-CM | POA: Diagnosis not present

## 2012-09-28 DIAGNOSIS — K551 Chronic vascular disorders of intestine: Secondary | ICD-10-CM | POA: Diagnosis present

## 2012-09-28 DIAGNOSIS — K529 Noninfective gastroenteritis and colitis, unspecified: Secondary | ICD-10-CM

## 2012-09-28 LAB — COMPREHENSIVE METABOLIC PANEL
ALT: 9 U/L (ref 0–35)
AST: 18 U/L (ref 0–37)
CO2: 20 mEq/L (ref 19–32)
Chloride: 105 mEq/L (ref 96–112)
Creatinine, Ser: 0.59 mg/dL (ref 0.50–1.10)
GFR calc Af Amer: >90 mL/min (ref >90)
GFR calc non Af Amer: >90 mL/min (ref >90)
Glucose, Bld: 113 mg/dL — ABNORMAL HIGH (ref 70–99)
Sodium: 140 mEq/L (ref 135–145)
Total Bilirubin: 0.2 mg/dL — ABNORMAL LOW (ref 0.3–1.2)

## 2012-09-28 LAB — CBC WITH DIFFERENTIAL/PLATELET
Basophils Absolute: 0 10*3/uL (ref 0.0–0.1)
Eosinophils Relative: 0 % (ref 0–5)
HCT: 43.7 % (ref 36.0–46.0)
Lymphocytes Relative: 12 % (ref 12–46)
Lymphs Abs: 1.8 10*3/uL (ref 0.7–4.0)
MCV: 87.4 fL (ref 78.0–100.0)
Monocytes Absolute: 0.9 10*3/uL (ref 0.1–1.0)
Neutro Abs: 13.3 10*3/uL — ABNORMAL HIGH (ref 1.7–7.7)
RBC: 5 MIL/uL (ref 3.87–5.11)
RDW: 14 % (ref 11.5–15.5)
WBC: 16 10*3/uL — ABNORMAL HIGH (ref 4.0–10.5)

## 2012-09-28 MED ORDER — IOHEXOL 300 MG/ML  SOLN
25.0000 mL | INTRAMUSCULAR | Status: DC
  Administered 2012-09-28: 25 mL via ORAL

## 2012-09-28 MED ORDER — ONDANSETRON HCL 4 MG/2ML IJ SOLN
4.0000 mg | Freq: Once | INTRAMUSCULAR | Status: AC
  Administered 2012-09-28: 4 mg via INTRAVENOUS
  Filled 2012-09-28: qty 2

## 2012-09-28 MED ORDER — MORPHINE SULFATE 4 MG/ML IJ SOLN
4.0000 mg | Freq: Once | INTRAMUSCULAR | Status: AC
  Administered 2012-09-28: 4 mg via INTRAVENOUS
  Filled 2012-09-28: qty 1

## 2012-09-28 MED ORDER — SODIUM CHLORIDE 0.9 % IV BOLUS (SEPSIS)
1000.0000 mL | Freq: Once | INTRAVENOUS | Status: AC
  Administered 2012-09-28: 1000 mL via INTRAVENOUS

## 2012-09-28 NOTE — ED Provider Notes (Signed)
CSN: [id number]     Arrival date & time 09/28/12  2049 History   First MD Initiated Contact with Patient 09/28/12 2131     Chief Complaint  Patient presents with  . Abdominal Pain   (Consider location/radiation/quality/duration/timing/severity/associated sxs/prior Treatment) Patient is a 67 y.o. female presenting with abdominal pain.  Abdominal Pain Pain location:  Generalized Pain quality: bloating   Pain radiates to:  Does not radiate Pain severity:  Moderate Onset quality:  Gradual Duration:  1 day Timing:  Constant Progression:  Worsening Chronicity:  New Context comment:  Colostomy with decreased output Relieved by:  Nothing Worsened by:  Movement Ineffective treatments: hydrocodone. Associated symptoms: vomiting   Associated symptoms: no chest pain, no cough, no diarrhea, no fever, no nausea and no shortness of breath     Past Medical History  Diagnosis Date  . Superior mesenteric artery syndrome 11/08    sp dilation by Dr. Ewing Schlein, Pt may require bowel resetion if sx recur  . Duodenal ulcer 11/08    With hemorrhage and obstruction  . Osteomyelitis, jaw acute 11/08    Started while in the hospital abcess showed GNR . SP debridement by Dr. Neoma Laming,  Ernesta Amble S Bovis sp 4  weeks of Pen V started on 05/19/07  with additional 2 weeks in 07/04/07  . Degenerative disk disease     This is interscapular, mild compression frx of T12 superior endplate with Schmorl's node. Seen on CT in 2/08  . Back pain   . Depression   . Acute sinusitis 06/30/2011  . Ischemic colitis 07/30/2011    2009 by endoscopy   . Perforated bowel     surgery july 2013   Past Surgical History  Procedure Laterality Date  . Abdominal hysterectomy    . Colostomy  07/30/2011    Procedure: COLOSTOMY;  Surgeon: Ardeth Sportsman, MD;  Location: [facility] OR;  Service: General;  Laterality: Left;  . Lysis of adhesions  07/30/2011  . Sigmoid colectomy  07/30/2011  . Transrectal drainage of pelvic abscess  07/30/2011   Family  History  Problem Relation Age of Onset  . Cancer Mother   . Heart disease Father   . Diabetes Sister    History  Substance Use Topics  . Smoking status: Current Some Day Smoker -- 0.20 packs/day for 20 years    Types: Cigarettes  . Smokeless tobacco: Not on file     Comment: 2 cigs/day  . Alcohol Use: No   OB History   Grav Para Term Preterm Abortions TAB SAB Ect Mult Living                 Review of Systems  Constitutional: Negative for fever.  HENT: Negative for congestion.   Respiratory: Negative for cough and shortness of breath.   Cardiovascular: Negative for chest pain.  Gastrointestinal: Positive for vomiting and abdominal pain. Negative for nausea and diarrhea.  All other systems reviewed and are negative.    Allergies  Ibuprofen; Latex; and Nsaids  Home Medications   Current Outpatient Rx  Name  Route  Sig  Dispense  Refill  . buPROPion (WELLBUTRIN) 100 MG tablet   Oral   Take 100 mg by mouth 4 (four) times daily.         . calcium-vitamin D (OSCAL WITH D 500-200) 500-200 MG-UNIT per tablet   Oral   Take 1 tablet by mouth 3 (three) times daily.           . cholecalciferol (VITAMIN  D) 1000 UNITS tablet   Oral   Take 1,000 Units by mouth 3 (three) times daily.         Marland Kitchen docusate sodium (COLACE) 100 MG capsule   Oral   Take 100 mg by mouth daily as needed for constipation.          Marland Kitchen esomeprazole (NEXIUM) 40 MG capsule   Oral   Take 1 capsule (40 mg total) by mouth 2 (two) times daily.   180 capsule   3     **Patient requests 90 day supply**   . HYDROcodone-acetaminophen (NORCO) 7.5-325 MG per tablet   Oral   Take 1 tablet by mouth every 6 (six) hours as needed for pain.   150 tablet   5   . methocarbamol (ROBAXIN) 750 MG tablet   Oral   Take 1 tablet (750 mg total) by mouth 4 (four) times daily as needed.   120 tablet   3   . morphine (MS CONTIN) 15 MG 12 hr tablet   Oral   Take 1 tablet (15 mg total) by mouth every 12 (twelve)  hours.   60 tablet   0     May fill on or after 10/25/12   . pravastatin (PRAVACHOL) 80 MG tablet   Oral   Take 1 tablet (80 mg total) by mouth daily.   30 tablet   11   . promethazine (PHENERGAN) 12.5 MG tablet   Oral   Take 1 tablet (12.5 mg total) by mouth every 6 (six) hours as needed.   90 tablet   3    BP 140/59  Pulse 97  Temp(Src) 98.3 F (36.8 C) (Oral)  Resp 20  SpO2 99%  LMP 08/25/1980 Physical Exam  Nursing note and vitals reviewed. Constitutional: She is oriented to person, place, and time. She appears well-developed and well-nourished. No distress.  HENT:  Head: Normocephalic and atraumatic.  Mouth/Throat: Oropharynx is clear and moist.  Eyes: Conjunctivae are normal. Pupils are equal, round, and reactive to light. No scleral icterus.  Neck: Neck supple.  Cardiovascular: Normal rate, regular rhythm, normal heart sounds and intact distal pulses.   No murmur heard. Pulmonary/Chest: Effort normal and breath sounds normal. No stridor. No respiratory distress. She has no rales.  Abdominal: Soft. Bowel sounds are normal. She exhibits no distension. There is generalized tenderness. There is no rigidity, no rebound and no guarding.  Ostomy in place with pink stoma.    Musculoskeletal: Normal range of motion.  Neurological: She is alert and oriented to person, place, and time.  Skin: Skin is warm and dry. No rash noted.  Psychiatric: She has a normal mood and affect. Her behavior is normal.    ED Course  Procedures (including critical care time) Labs Review All labs drawn in ED reviewed.  Imaging Review No results found.  MDM   1. Colitis    Cramping abdominal pain.  Has prior surgery, ostomy.  Decreased output.  No fevers.  Has been vomiting.  Plan to check labs and CT.  Of note, she ran out of her ms contin 5 days ago, but she does not think this is reason for pain today.  IV morphine and Zofran given for pain.   CT pending.  Care transferred to Dr.  Elesa Massed.   Candyce Churn, MD 09/29/12 1146

## 2012-09-28 NOTE — ED Notes (Signed)
Per EMS- pt experiencing abdominal pain above her colostomy while at her home in [facility]. Pt self medicated with hydrocodone X 2 and began experiencing nausea and vomiting. Pt then took Phenergan prior to EMS pick up. No vomitting since she has taken the nausea medication.

## 2012-09-28 NOTE — ED Notes (Signed)
Pt states at about around 1, her abdomen above her colostomy to her LLQ was experiencing pain. Pt states she emptied it this morning and her stool was hard. Pt states she then self medicated with hydrocodone. When pain did not subside, pt took another hydrocodone and began feeling nausea and vomiting. Pt then took phenergan while waiting for EMS. Pt states her pain is now a 6/10 to abdomen. Brown liquid stool draining into colostomy pouch without difficulty. Abdomen is soft. Bowel sounds present.

## 2012-09-28 NOTE — Telephone Encounter (Signed)
Written prescriptions given to [facility].  Patient to be notified.

## 2012-09-29 ENCOUNTER — Emergency Department (HOSPITAL_COMMUNITY): Payer: Medicare Other

## 2012-09-29 DIAGNOSIS — D72829 Elevated white blood cell count, unspecified: Secondary | ICD-10-CM

## 2012-09-29 DIAGNOSIS — K5289 Other specified noninfective gastroenteritis and colitis: Secondary | ICD-10-CM

## 2012-09-29 DIAGNOSIS — E872 Acidosis: Secondary | ICD-10-CM

## 2012-09-29 DIAGNOSIS — A0472 Enterocolitis due to Clostridium difficile, not specified as recurrent: Secondary | ICD-10-CM

## 2012-09-29 DIAGNOSIS — K529 Noninfective gastroenteritis and colitis, unspecified: Secondary | ICD-10-CM

## 2012-09-29 DIAGNOSIS — R109 Unspecified abdominal pain: Secondary | ICD-10-CM

## 2012-09-29 LAB — URINALYSIS, ROUTINE W REFLEX MICROSCOPIC
Leukocytes, UA: NEGATIVE
Nitrite: NEGATIVE
Protein, ur: NEGATIVE mg/dL
Specific Gravity, Urine: 1.023 (ref 1.005–1.030)
Urobilinogen, UA: 0.2 mg/dL (ref 0.0–1.0)

## 2012-09-29 MED ORDER — MORPHINE SULFATE 2 MG/ML IJ SOLN
2.0000 mg | INTRAMUSCULAR | Status: DC | PRN
  Administered 2012-09-29 – 2012-10-02 (×20): 2 mg via INTRAVENOUS
  Filled 2012-09-29 (×20): qty 1

## 2012-09-29 MED ORDER — SODIUM CHLORIDE 0.9 % IV SOLN
3.0000 g | Freq: Four times a day (QID) | INTRAVENOUS | Status: DC
  Administered 2012-09-29 – 2012-10-02 (×13): 3 g via INTRAVENOUS
  Filled 2012-09-29 (×16): qty 3

## 2012-09-29 MED ORDER — BUPROPION HCL 100 MG PO TABS
100.0000 mg | ORAL_TABLET | Freq: Three times a day (TID) | ORAL | Status: DC
  Administered 2012-09-29 – 2012-10-02 (×13): 100 mg via ORAL
  Filled 2012-09-29 (×18): qty 1

## 2012-09-29 MED ORDER — ONDANSETRON HCL 4 MG/2ML IJ SOLN
4.0000 mg | Freq: Once | INTRAMUSCULAR | Status: AC
  Administered 2012-09-29: 4 mg via INTRAVENOUS
  Filled 2012-09-29: qty 2

## 2012-09-29 MED ORDER — PIPERACILLIN-TAZOBACTAM 3.375 G IVPB
3.3750 g | Freq: Once | INTRAVENOUS | Status: AC
  Administered 2012-09-29: 3.375 g via INTRAVENOUS
  Filled 2012-09-29: qty 50

## 2012-09-29 MED ORDER — IOHEXOL 300 MG/ML  SOLN
100.0000 mL | Freq: Once | INTRAMUSCULAR | Status: AC | PRN
  Administered 2012-09-29: 100 mL via INTRAVENOUS

## 2012-09-29 MED ORDER — MORPHINE SULFATE 4 MG/ML IJ SOLN
4.0000 mg | Freq: Once | INTRAMUSCULAR | Status: AC
  Administered 2012-09-29: 4 mg via INTRAVENOUS
  Filled 2012-09-29: qty 1

## 2012-09-29 MED ORDER — CIPROFLOXACIN IN D5W 400 MG/200ML IV SOLN: 400.0000 mg | Freq: Once | INTRAVENOUS | Status: DC

## 2012-09-29 MED ORDER — HEPARIN SODIUM (PORCINE) 5000 UNIT/ML IJ SOLN
5000.0000 [IU] | Freq: Three times a day (TID) | INTRAMUSCULAR | Status: DC
  Administered 2012-09-29 – 2012-10-02 (×10): 5000 [IU] via SUBCUTANEOUS
  Filled 2012-09-29 (×12): qty 1

## 2012-09-29 MED ORDER — SODIUM CHLORIDE 0.9 % IV SOLN
INTRAVENOUS | Status: DC
  Administered 2012-09-29 – 2012-09-30 (×5): via INTRAVENOUS

## 2012-09-29 MED ORDER — METRONIDAZOLE IN NACL 5-0.79 MG/ML-% IV SOLN: 500.0000 mg | Freq: Once | INTRAVENOUS | Status: DC

## 2012-09-29 MED ORDER — PROMETHAZINE HCL 25 MG PO TABS
12.5000 mg | ORAL_TABLET | Freq: Four times a day (QID) | ORAL | Status: DC | PRN
  Administered 2012-09-29 – 2012-10-02 (×5): 12.5 mg via ORAL
  Filled 2012-09-29 (×5): qty 1

## 2012-09-29 NOTE — H&P (Signed)
INTERNAL MEDICINE TEACHING SERVICE Attending Admission Note  Date: 09/29/2012  Patient name: [name] Bell  Medical record number: [id number]  Date of birth: 27-Dec-1945    I have seen and evaluated [name] and discussed their care with the Residency Team.   67 yr old female with hx ischemic colitis, s/p colostomy placement 1 yr ago due to ruptured diverticulum, presented with abdominal pain. She is noted to have tenderness on LLQ with findings of colitis on CT abd. She has a mild lactic acidosis as well as a leukocytosis. Her abdomen is soft and non-distended with no guarding.  Agree with surgical consult.  Cdiff is negative. Likely infectious, though ischemia needs to be considered. Continue Unasyn at this time.  BC obtained.  Continue IVF. Watch hemodynamic status and follow abdominal exam.  Jonah Blue, DO, FACP Faculty [facility] Internal Medicine Residency Program 09/29/2012, 2:25 PM

## 2012-09-29 NOTE — ED Provider Notes (Signed)
12:39 AM  Assumed care.  Pt is a 67 year old female with a history of prior diverticulitis with rupture and colostomy bag who was here for diffuse abdominal pain and decreased output from her colostomy. She has a leukocytosis with left shift. Slightly elevated lactate. Otherwise hemodynamically stable. CT scan is pending. Disposition per CT.  3:15 AM  Pt persistent pain and nausea. Her CT scan shows distal colitis is likely infectious given her leukocytosis with left shift. Will treat with IV Zosyn and admitted to the hospital. PCP is Dr. Alycia Rossetti brown with internal medicine. Patient updated and agrees with this plan.  Layla Maw Joseff Luckman, DO 09/29/12 [phone]

## 2012-09-29 NOTE — Progress Notes (Signed)
Utilization review completed. Kylina Vultaggio, RN, BSN. 

## 2012-09-29 NOTE — Consult Note (Signed)
Colitis without perforation. Antibiotics, hydration, and pain control Marta Lamas. Gae Bon, MD, FACS [phone] [facility] Surgery

## 2012-09-29 NOTE — Consult Note (Signed)
[name] Bell 01/30/1945  [id number]  Primary Care MD: Janalyn Harder, MD Requesting MD: Dr. Kem Kays  Chief Complaint/Reason for Consult: rule out ischemic colitis  HPI: [name] Bell is a 67 y.o. female with a history of SMA syndrome, ischemic colitis, and perforated bowel who is s/p sigmoid colectomy with colostomy placement in July 2013 who presents complaining of worsening abdominal pain and nausea. Pt states she has chronic abdominal pain but this acutely worsened within the last several days. She has been nauseated and vomited one time. Vomit was nonbloody nonbilious. Last bowel movement was last night. Has noted ostomy output being more runny than usual.  ROS: + nausea, + vomiting, + abdominal pain, no objective fevers  Family History  Problem Relation Age of Onset  . Cancer Mother   . Heart disease Father   . Diabetes Sister     Past Medical History  Diagnosis Date  . Superior mesenteric artery syndrome 11/08    sp dilation by Dr. Ewing Schlein, Pt may require bowel resetion if sx recur  . Duodenal ulcer 11/08    With hemorrhage and obstruction  . Osteomyelitis, jaw acute 11/08    Started while in the hospital abcess showed GNR . SP debridement by Dr. Neoma Laming,  Ernesta Amble S Bovis sp 4  weeks of Pen V started on 05/19/07  with additional 2 weeks in 07/04/07  . Degenerative disk disease     This is interscapular, mild compression frx of T12 superior endplate with Schmorl's node. Seen on CT in 2/08  . Back pain   . Depression   . Acute sinusitis 06/30/2011  . Ischemic colitis 07/30/2011    2009 by endoscopy   . Perforated bowel     surgery july 2013    Past Surgical History  Procedure Laterality Date  . Abdominal hysterectomy    . Colostomy  07/30/2011    Procedure: COLOSTOMY;  Surgeon: Ardeth Sportsman, MD;  Location: [facility] OR;  Service: General;  Laterality: Left;  . Lysis of adhesions  07/30/2011  . Sigmoid colectomy  07/30/2011  . Transrectal drainage of pelvic abscess  07/30/2011    Social  History:  reports that she has been smoking Cigarettes.  She has a 4 pack-year smoking history. She does not have any smokeless tobacco history on file. She reports that she does not drink alcohol or use illicit drugs.  Allergies:  Allergies  Allergen Reactions  . Ibuprofen     REACTION: Bleeding ulcers  . Latex Itching  . Nsaids     REACTION: Bleeding Ulcer    Medications Prior to Admission  Medication Sig Dispense Refill  . buPROPion (WELLBUTRIN) 100 MG tablet Take 100 mg by mouth 4 (four) times daily.      . calcium-vitamin D (OSCAL WITH D 500-200) 500-200 MG-UNIT per tablet Take 1 tablet by mouth 3 (three) times daily.        . cholecalciferol (VITAMIN D) 1000 UNITS tablet Take 1,000 Units by mouth 3 (three) times daily.      Marland Kitchen docusate sodium (COLACE) 100 MG capsule Take 100 mg by mouth daily as needed for constipation.       Marland Kitchen esomeprazole (NEXIUM) 40 MG capsule Take 1 capsule (40 mg total) by mouth 2 (two) times daily.  180 capsule  3  . HYDROcodone-acetaminophen (NORCO) 7.5-325 MG per tablet Take 1 tablet by mouth every 6 (six) hours as needed for pain.  150 tablet  5  . methocarbamol (ROBAXIN) 750 MG tablet Take 1  tablet (750 mg total) by mouth 4 (four) times daily as needed.  120 tablet  3  . morphine (MS CONTIN) 15 MG 12 hr tablet Take 1 tablet (15 mg total) by mouth every 12 (twelve) hours.  60 tablet  0  . pravastatin (PRAVACHOL) 80 MG tablet Take 1 tablet (80 mg total) by mouth daily.  30 tablet  11  . promethazine (PHENERGAN) 12.5 MG tablet Take 1 tablet (12.5 mg total) by mouth every 6 (six) hours as needed.  90 tablet  3    Blood pressure 112/52, pulse 111, temperature 98.6 F (37 C), temperature source Oral, resp. rate 20, height 5\' 4"  (1.626 m), weight 156 lb 15.5 oz (71.2 kg), last menstrual period 08/25/1980, SpO2 95.00%. Physical Exam: Gen: NAD, pleasant, cooperative HEENT: dry mucous membranes Heart: mildly tachycardic, regular rhythm, no murmurs Lungs:  bilateral expiratory wheeze present Abd: + BS. Soft. Moderately tender to palpation primarily around ostomy site, which is located in the LLQ. No surrounding erythema or warmth. Stoma is pink with good output. No peritoneal signs. Ext: calves without evidence of DVT (no erythema, warmth, or tenderness to palpation). Negative Homan's bilaterally. Neuro: grossly nonfocal, speech intact   Results for orders placed during the hospital encounter of 09/28/12 (from the past 48 hour(s))  CBC WITH DIFFERENTIAL     Status: Abnormal   Collection Time    09/28/12 10:03 PM      Result Value Range   WBC 16.0 (*) 4.0 - 10.5 K/uL   RBC 5.00  3.87 - 5.11 MIL/uL   Hemoglobin 15.3 (*) 12.0 - 15.0 g/dL   HCT 16.1  09.6 - 04.5 %   MCV 87.4  78.0 - 100.0 fL   MCH 30.6  26.0 - 34.0 pg   MCHC 35.0  30.0 - 36.0 g/dL   RDW 40.9  81.1 - 91.4 %   Platelets 297  150 - 400 K/uL   Neutrophils Relative % 83 (*) 43 - 77 %   Neutro Abs 13.3 (*) 1.7 - 7.7 K/uL   Lymphocytes Relative 12  12 - 46 %   Lymphs Abs 1.8  0.7 - 4.0 K/uL   Monocytes Relative 5  3 - 12 %   Monocytes Absolute 0.9  0.1 - 1.0 K/uL   Eosinophils Relative 0  0 - 5 %   Eosinophils Absolute 0.0  0.0 - 0.7 K/uL   Basophils Relative 0  0 - 1 %   Basophils Absolute 0.0  0.0 - 0.1 K/uL  COMPREHENSIVE METABOLIC PANEL     Status: Abnormal   Collection Time    09/28/12 10:03 PM      Result Value Range   Sodium 140  135 - 145 mEq/L   Potassium 4.4  3.5 - 5.1 mEq/L   Comment: HEMOLYSIS AT THIS LEVEL MAY AFFECT RESULT   Chloride 105  96 - 112 mEq/L   CO2 20  19 - 32 mEq/L   Glucose, Bld 113 (*) 70 - 99 mg/dL   BUN 10  6 - 23 mg/dL   Creatinine, Ser 7.82  0.50 - 1.10 mg/dL   Calcium 95.6  8.4 - 21.3 mg/dL   Total Protein 6.7  6.0 - 8.3 g/dL   Albumin 3.3 (*) 3.5 - 5.2 g/dL   AST 18  0 - 37 U/L   ALT 9  0 - 35 U/L   Alkaline Phosphatase 132 (*) 39 - 117 U/L   Total Bilirubin 0.2 (*) 0.3 - 1.2 mg/dL  GFR calc non Af Amer >90  >90 mL/min   GFR  calc Af Amer >90  >90 mL/min   Comment: (NOTE)     The eGFR has been calculated using the CKD EPI equation.     This calculation has not been validated in all clinical situations.     eGFR's persistently <90 mL/min signify possible Chronic Kidney     Disease.  LIPASE, BLOOD     Status: None   Collection Time    09/28/12 10:03 PM      Result Value Range   Lipase 23  11 - 59 U/L  LACTIC ACID, PLASMA     Status: Abnormal   Collection Time    09/28/12 10:03 PM      Result Value Range   Lactic Acid, Venous 2.4 (*) 0.5 - 2.2 mmol/L  URINALYSIS, ROUTINE W REFLEX MICROSCOPIC     Status: Abnormal   Collection Time    09/29/12  1:58 AM      Result Value Range   Color, Urine AMBER (*) YELLOW   Comment: BIOCHEMICALS MAY BE AFFECTED BY COLOR   APPearance CLOUDY (*) CLEAR   Specific Gravity, Urine 1.023  1.005 - 1.030   pH 5.0  5.0 - 8.0   Glucose, UA NEGATIVE  NEGATIVE mg/dL   Hgb urine dipstick NEGATIVE  NEGATIVE   Bilirubin Urine SMALL (*) NEGATIVE   Ketones, ur NEGATIVE  NEGATIVE mg/dL   Protein, ur NEGATIVE  NEGATIVE mg/dL   Urobilinogen, UA 0.2  0.0 - 1.0 mg/dL   Nitrite NEGATIVE  NEGATIVE   Leukocytes, UA NEGATIVE  NEGATIVE   Comment: MICROSCOPIC NOT DONE ON URINES WITH NEGATIVE PROTEIN, BLOOD, LEUKOCYTES, NITRITE, OR GLUCOSE <1000 mg/dL.   Ct Abdomen Pelvis W Contrast  09/29/2012   *RADIOLOGY REPORT*  Clinical Data: Abdominal pain.  CT ABDOMEN AND PELVIS WITH CONTRAST  Technique:  Multidetector CT imaging of the abdomen and pelvis was performed following the standard protocol during bolus administration of intravenous contrast.  Contrast: OMNIPAQUE IOHEXOL 300 MG/ML  SOLN  Comparison: 08/17/2011.  Findings:  BODY WALL: Unremarkable.  LOWER CHEST:  Mediastinum: Coronary artery atherosclerosis.  Lungs/pleura: Scarring in the lateral left costophrenic sulcus is again seen.  No consolidation.  ABDOMEN/PELVIS:  Liver: No focal abnormality.  Biliary: Distended gallbladder without  surrounding inflammatory changes.  Upper limits of normal CBD for age, 7 to 8 mm - unchanged from prior.  Pancreas: Unremarkable.  Spleen: Unremarkable.  Adrenals: Unremarkable.  Kidneys and ureters: No hydronephrosis or stone.  Bladder: Unremarkable.  Bowel: Circumferential thickening of the distal colon, beginning at the distal transverse colon and continuing to the end colostomy. There is pericolonic fat infiltration, particularly around the colostomy.  No evidence of perforation.  The mucosa and adventitia remains enhancing; there is no major vessel occlusion.  No bowel obstruction. No pericecal inflammatory changes.  Retroperitoneum: No mass or adenopathy.  Peritoneum: No free fluid or gas.  Reproductive: Hysterectomy.  Vascular: Incidental retroaortic left renal vein.  Diffuse atherosclerosis.  OSSEOUS: No acute abnormalities. Remote superior endplate T12 fracture.  Osteopenia.  IMPRESSION: Distal colitis, extending from the distal transverse colon to the descending colostomy. The colitis is nonspecific, and could be infectious, inflammatory, or ischemic.   Original Report Authenticated By: Tiburcio Pea     Assessment/Plan [name] is a 67 y.o. female with a history of SMA syndrome, s/p colostomy placement over 1 year ago who presents with increased abdominal pain and vomiting. CT abdomen shows evidence  of colitis, although unclear in specific etiology. At this point pt would benefit most from conservative medical treatment.  Recommendations: -bowel rest with clear liquid diet only -IV hydration -antibiotic coverage with IV zosyn -incentive spirometry/ambulation as tolerated  Surgery team will continue to follow along with you.  Levert Feinstein, MD Family Medicine PGY-2  Surgery PA Pager: [phone]

## 2012-09-29 NOTE — Progress Notes (Addendum)
ANTIBIOTIC CONSULT NOTE - INITIAL  Pharmacy Consult for Zosyn Indication: Empiric for possible infectious colitis  Allergies  Allergen Reactions  . Ibuprofen     REACTION: Bleeding ulcers  . Latex Itching  . Nsaids     REACTION: Bleeding Ulcer   Patient Measurements: Height: 5\' 4"  (162.6 cm) Weight: 156 lb 15.5 oz (71.2 kg) IBW/kg (Calculated) : 54.7  Vital Signs: Temp: 98.8 F (37.1 C) (09/04 1110) Temp src: Oral (09/04 1110) BP: 119/30 mmHg (09/04 1110) Pulse Rate: 115 (09/04 1110) Intake/Output from previous day: 09/03 0701 - 09/04 0700 In: 1000 [I.V.:1000] Out: 900 [Urine:500; Stool:400] Intake/Output from this shift: Total I/O In: -  Out: 700 [Urine:400; Stool:300]  Labs:  Recent Labs  09/28/12 2203  WBC 16.0*  HGB 15.3*  PLT 297  CREATININE 0.59   Estimated Creatinine Clearance: 64.2 ml/min (by C-G formula based on Cr of 0.59).  Microbiology: Recent Results (from the past 720 hour(s))  CLOSTRIDIUM DIFFICILE BY PCR     Status: None   Collection Time    09/29/12  8:46 AM      Result Value Range Status   C difficile by pcr NEGATIVE  NEGATIVE Final   Medical History: Past Medical History  Diagnosis Date  . Superior mesenteric artery syndrome 11/08    sp dilation by Dr. Ewing Schlein, Pt may require bowel resetion if sx recur  . Duodenal ulcer 11/08    With hemorrhage and obstruction  . Osteomyelitis, jaw acute 11/08    Started while in the hospital abcess showed GNR . SP debridement by Dr. Neoma Laming,  Ernesta Amble S Bovis sp 4  weeks of Pen V started on 05/19/07  with additional 2 weeks in 07/04/07  . Degenerative disk disease     This is interscapular, mild compression frx of T12 superior endplate with Schmorl's node. Seen on CT in 2/08  . Back pain   . Depression   . Acute sinusitis 06/30/2011  . Ischemic colitis 07/30/2011    2009 by endoscopy   . Perforated bowel     surgery july 2013   Medications:  Anti-infectives   Start     Dose/Rate Route Frequency  Ordered Stop   09/29/12 0315  metroNIDAZOLE (FLAGYL) IVPB 500 mg  Status:  Discontinued     500 mg 100 mL/hr over 60 Minutes Intravenous  Once 09/29/12 0314 09/29/12 0314   09/29/12 0315  ciprofloxacin (CIPRO) IVPB 400 mg  Status:  Discontinued     400 mg 200 mL/hr over 60 Minutes Intravenous  Once 09/29/12 0314 09/29/12 0314   09/29/12 0315  piperacillin-tazobactam (ZOSYN) IVPB 3.375 g     3.375 g 12.5 mL/hr over 240 Minutes Intravenous  Once 09/29/12 0314 09/29/12 0505     Assessment: 67 yo female admitted with c/o abdominal pain.  She has a PMH significant for ischemic colitis, perforated bowel requiring surgery in 2013.  We were asked to dose her antibiotics and she has received one dose of IV Zosyn at ~ 5AM.  She has a creatinine of 0.59 and an estimated clearance of 64 ml/min.  She has leukocytosis with WBC of 16K.  Her lactic acid was 2.4 mmol/l.  Blood and urine cultures have been obtained and a C.Diff sample was negative.  ANTIBIOTIC Coverage: Extended Spectrum Penicillins (Zosyn - piperacillin/tazobactam) - -lactam/-lactamase inhibitor Bacteroidies (anaerobes), -lactamase producing Gm - (E. coli, Klebsiella, Serratia) & Gm + (Enterococci, MSSA)  Group A & B Streptococci, Clostridium perfringes B. fragilis, Prevotella spp. Pseudomonas  Goal  of Therapy:  Therapeutic response to IV antibiotics with de-escalation for good antibiotic stewardship.   Plan:   - Zosyn 3.375 gm IV every 8 hours over 4 hours  - Monitor renal function and adjust accordingly  - Monitor culture data and ensure appropriate antibiotic coverage.  Nadara Mustard, PharmD., MS Clinical Pharmacist Pager:  [phone] Thank you for allowing pharmacy to be part of this patients care team. 09/29/2012,11:30 AM  Addendum: IV antibiotics changed to Unasyn.  This antibiotic is still an extended spectrum penicillin and should provide appropriate coverage for most GI organisms.  Plan: 1.  Unasyn 3 gm. IV q 6  hours.  Nadara Mustard, PharmD., MS Pager:  [phone]

## 2012-09-29 NOTE — H&P (Signed)
Date: 09/29/2012               Patient Name:  [name] Bell MRN: [id number]  DOB: [birth year] Age / Sex: 67 y.o., female   PCP: Linward Headland, MD         Medical Service: Internal Medicine Teaching Service         Attending Physician: Dr. Jonah Blue, DO    First Contact: Dr. Jannifer Hick, MD Pager: [phone]  Second Contact: Dr. Charlsie Merles, MD Pager: [phone]       After Hours (After 5p/  First Contact Pager: [phone]  weekends / holidays): Second Contact Pager: [phone]   Chief Complaint: Abdomen Pain  History of Present Illness: [name] Bell is a 67 y.o. woman with a history of ischemic colitis, ruptured diverticula s/p colostomy 1 year ago and chronic pain who presents with a cc of abdominal pain. The patients abdominal pain has been increasing for the last month gradually. The pain is located in the LLQ surrounding her ostomy. The pain has been much worse since running out of her MS Contin 3 days ago. Since then, the pain has been noticeably increasing. She has been avoiding coming to the hospital as today is the 1 year anniversary of her discharge from the hospital after her ruptured colon. She admits a couple days of nausea and vomiting. She admits decreased oral intake of food and water during this time.  She does not recall a change in ostomy output. She denies bloody output from her ostomy. Denies dysuria, SOB, Chest Pain. Admits to cough. Denies fevers, but admits chills.   Meds: Current Facility-Administered Medications  Medication Dose Route Frequency Provider Last Rate Last Dose  . piperacillin-tazobactam (ZOSYN) IVPB 3.375 g  3.375 g Intravenous Once Layla Maw Ward, DO       Current Outpatient Prescriptions  Medication Sig Dispense Refill  . buPROPion (WELLBUTRIN) 100 MG tablet Take 100 mg by mouth 4 (four) times daily.      . calcium-vitamin D (OSCAL WITH D 500-200) 500-200 MG-UNIT per tablet Take 1 tablet by mouth 3 (three) times daily.        . cholecalciferol  (VITAMIN D) 1000 UNITS tablet Take 1,000 Units by mouth 3 (three) times daily.      Marland Kitchen docusate sodium (COLACE) 100 MG capsule Take 100 mg by mouth daily as needed for constipation.       Marland Kitchen esomeprazole (NEXIUM) 40 MG capsule Take 1 capsule (40 mg total) by mouth 2 (two) times daily.  180 capsule  3  . HYDROcodone-acetaminophen (NORCO) 7.5-325 MG per tablet Take 1 tablet by mouth every 6 (six) hours as needed for pain.  150 tablet  5  . methocarbamol (ROBAXIN) 750 MG tablet Take 1 tablet (750 mg total) by mouth 4 (four) times daily as needed.  120 tablet  3  . morphine (MS CONTIN) 15 MG 12 hr tablet Take 1 tablet (15 mg total) by mouth every 12 (twelve) hours.  60 tablet  0  . pravastatin (PRAVACHOL) 80 MG tablet Take 1 tablet (80 mg total) by mouth daily.  30 tablet  11  . promethazine (PHENERGAN) 12.5 MG tablet Take 1 tablet (12.5 mg total) by mouth every 6 (six) hours as needed.  90 tablet  3    Allergies: Allergies as of 09/28/2012 - Review Complete 09/28/2012  Allergen Reaction Noted  . Ibuprofen    . Latex Itching 07/29/2011  . Nsaids     Past Medical  History  Diagnosis Date  . Superior mesenteric artery syndrome 11/08    sp dilation by Dr. Ewing Schlein, Pt may require bowel resetion if sx recur  . Duodenal ulcer 11/08    With hemorrhage and obstruction  . Osteomyelitis, jaw acute 11/08    Started while in the hospital abcess showed GNR . SP debridement by Dr. Neoma Laming,  Ernesta Amble S Bovis sp 4  weeks of Pen V started on 05/19/07  with additional 2 weeks in 07/04/07  . Degenerative disk disease     This is interscapular, mild compression frx of T12 superior endplate with Schmorl's node. Seen on CT in 2/08  . Back pain   . Depression   . Acute sinusitis 06/30/2011  . Ischemic colitis 07/30/2011    2009 by endoscopy   . Perforated bowel     surgery july 2013   Past Surgical History  Procedure Laterality Date  . Abdominal hysterectomy    . Colostomy  07/30/2011    Procedure: COLOSTOMY;  Surgeon:  Ardeth Sportsman, MD;  Location: [facility] OR;  Service: General;  Laterality: Left;  . Lysis of adhesions  07/30/2011  . Sigmoid colectomy  07/30/2011  . Transrectal drainage of pelvic abscess  07/30/2011   Family History  Problem Relation Age of Onset  . Cancer Mother   . Heart disease Father   . Diabetes Sister    History   Social History  . Marital Status: Divorced    Spouse Name: N/A    Number of Children: N/A  . Years of Education: N/A   Occupational History  . Not on file.   Social History Main Topics  . Smoking status: Current Some Day Smoker -- 0.20 packs/day for 20 years    Types: Cigarettes  . Smokeless tobacco: Not on file     Comment: 2 cigs/day  . Alcohol Use: No  . Drug Use: No  . Sexual Activity: Not on file   Other Topics Concern  . Not on file   Social History Narrative   Pt now lives by herself in an ALF [facility]) where she can come and go as she pleases.  A good friend named [name] still looks out for her daily.     Review of Systems: Pertinent items are noted in HPI.  Physical Exam: Blood pressure 125/52, pulse 100, temperature 98.3 F (36.8 C), temperature source Oral, resp. rate 21, last menstrual period 08/25/1980, SpO2 96.00%. Physical Exam  Constitutional: She is oriented to person, place, and time. She appears well-developed and well-nourished. No distress.  HENT:  Head: Normocephalic.  Mouth/Throat: No oropharyngeal exudate.  Oropharynx tacky  Eyes: Pupils are equal, round, and reactive to light.  Cardiovascular: Regular rhythm, normal heart sounds and intact distal pulses.  Exam reveals no friction rub.   No murmur heard. tachycardic  Pulmonary/Chest: No respiratory distress. She has no wheezes. She has no rales.  Coarse breath sounds bilaterally  Abdominal: Soft. Bowel sounds are normal. She exhibits no distension. There is tenderness. There is no rebound and no guarding.  Tender to palpation in the LLQ and LUQ surrounding her ostomy.    Genitourinary:  Skin tags around the anus. No stool in the vault, no firm masses, no blood. Pain felt near ostomy when deeper palpation of the rectal pouch.  Musculoskeletal: She exhibits no edema and no tenderness.  Neurological: She is alert and oriented to person, place, and time.  Skin: She is not diaphoretic.  Psychiatric: She has a normal mood  and affect. Her behavior is normal.   Lab results: Basic Metabolic Panel:  Recent Labs  16/10/96 2203  NA 140  K 4.4  CL 105  CO2 20  GLUCOSE 113*  BUN 10  CREATININE 0.59  CALCIUM 10.0  Gap 15  Liver Function Tests:  Recent Labs  09/28/12 2203  AST 18  ALT 9  ALKPHOS 132*  BILITOT 0.2*  PROT 6.7  ALBUMIN 3.3*    Recent Labs  09/28/12 2203  LIPASE 23   CBC:  Recent Labs  09/28/12 2203  WBC 16.0*  NEUTROABS 13.3*  HGB 15.3*  HCT 43.7  MCV 87.4  PLT 297   Urine Drug Screen: Drugs of Abuse     Component Value Date/Time   LABOPIA POS* 02/18/2010 2218   COCAINSCRNUR NEG 02/18/2010 2218   LABBENZ NEG 02/18/2010 2218   AMPHETMU NEG 02/18/2010 2218    Urinalysis:  Recent Labs  09/29/12 0158  COLORURINE AMBER*  LABSPEC 1.023  PHURINE 5.0  GLUCOSEU NEGATIVE  HGBUR NEGATIVE  BILIRUBINUR SMALL*  KETONESUR NEGATIVE  PROTEINUR NEGATIVE  UROBILINOGEN 0.2  NITRITE NEGATIVE  LEUKOCYTESUR NEGATIVE   Misc. Labs: Lactic Acid: 2.4  Imaging results:  Ct Abdomen Pelvis W Contrast  09/29/2012   *RADIOLOGY REPORT*  Clinical Data: Abdominal pain.  CT ABDOMEN AND PELVIS WITH CONTRAST  Technique:  Multidetector CT imaging of the abdomen and pelvis was performed following the standard protocol during bolus administration of intravenous contrast.  Contrast: OMNIPAQUE IOHEXOL 300 MG/ML  SOLN  Comparison: 08/17/2011.  Findings:  BODY WALL: Unremarkable.  LOWER CHEST:  Mediastinum: Coronary artery atherosclerosis.  Lungs/pleura: Scarring in the lateral left costophrenic sulcus is again seen.  No consolidation.   ABDOMEN/PELVIS:  Liver: No focal abnormality.  Biliary: Distended gallbladder without surrounding inflammatory changes.  Upper limits of normal CBD for age, 7 to 8 mm - unchanged from prior.  Pancreas: Unremarkable.  Spleen: Unremarkable.  Adrenals: Unremarkable.  Kidneys and ureters: No hydronephrosis or stone.  Bladder: Unremarkable.  Bowel: Circumferential thickening of the distal colon, beginning at the distal transverse colon and continuing to the end colostomy. There is pericolonic fat infiltration, particularly around the colostomy.  No evidence of perforation.  The mucosa and adventitia remains enhancing; there is no major vessel occlusion.  No bowel obstruction. No pericecal inflammatory changes.  Retroperitoneum: No mass or adenopathy.  Peritoneum: No free fluid or gas.  Reproductive: Hysterectomy.  Vascular: Incidental retroaortic left renal vein.  Diffuse atherosclerosis.  OSSEOUS: No acute abnormalities. Remote superior endplate T12 fracture.  Osteopenia.  IMPRESSION: Distal colitis, extending from the distal transverse colon to the descending colostomy. The colitis is nonspecific, and could be infectious, inflammatory, or ischemic.   Original Report Authenticated By: Tiburcio Pea   Assessment & Plan by Problem: Principal Problem:   Acute colitis Active Problems:   HYPERLIPIDEMIA   DEPRESSION   Chronic pain syndrome   Diverticulitis of colon with perforation   Colostomy in place  # Acute Colitis The patients acute colitis is concerning for infectious, ischemic, and inflammatory etiologies.  Infection is a likely cause as the patient has leukocytosis (15.3 with left side) and tachycardia. However, a stress response to other etiologies could also cause this presentation. The patient is afebrile, making GN bacteremia less likely. As the patient has a history of ischemic colitis and has an elevated lactic acid at 2.4,  Ischemia is a possibly cause. However, the 1 month slowly progressive  onset makes acute obstructive ischemic colitis less likely.  Inflammatory bowel diease could also be playing a role. - Blood Cx x2 - IV Zosyn - Aggressive Pain Control (patient was on chronic opiate therapy with 15 mg MS Contin BID and Percocet qid prn) - Consult GI in AM - F/U CBC, BMP in AM  # Tachycardia The patients tachycardia is likely due to hypovolemia as caused by decreased oral intake, as she appears dehydrated on exam and hemoconcentrated. However, it could also represent SIRS or sepsis. Furthermore, it could be due to opiate w/d as patient ran out of MS Contin 15 mg BID 3 days ago. - 1 L NS given in ED - Continue NS at 100 cc / hr  # N/V - Phenergan and Zofran IV prn - NPO  # Depression Continue Home Wellbutron  Prophylaxis: Heparin sq, SCDs  Nutrition: NPO sips with meds  Dispo: Disposition is deferred at this time, awaiting improvement of current medical problems. Anticipated discharge in approximately 2-3 day(s).   The patient does have a current PCP Linward Headland, MD) and does need an [facility] hospital follow-up appointment after discharge.  The patient does not know have transportation limitations that hinder transportation to clinic appointments.  Signed: Pleas Koch, MD 09/29/2012, 3:44 AM

## 2012-09-30 DIAGNOSIS — Z933 Colostomy status: Secondary | ICD-10-CM

## 2012-09-30 LAB — CBC WITH DIFFERENTIAL/PLATELET
Eosinophils Relative: 0 % (ref 0–5)
HCT: 40 % (ref 36.0–46.0)
Hemoglobin: 13.4 g/dL (ref 12.0–15.0)
Lymphocytes Relative: 20 % (ref 12–46)
Lymphs Abs: 2.3 10*3/uL (ref 0.7–4.0)
MCV: 89.7 fL (ref 78.0–100.0)
Monocytes Absolute: 0.7 10*3/uL (ref 0.1–1.0)
Monocytes Relative: 6 % (ref 3–12)
RBC: 4.46 MIL/uL (ref 3.87–5.11)
WBC: 11.8 10*3/uL — ABNORMAL HIGH (ref 4.0–10.5)

## 2012-09-30 LAB — COMPREHENSIVE METABOLIC PANEL
ALT: 6 U/L (ref 0–35)
BUN: 5 mg/dL — ABNORMAL LOW (ref 6–23)
CO2: 24 mEq/L (ref 19–32)
Calcium: 8.2 mg/dL — ABNORMAL LOW (ref 8.4–10.5)
Creatinine, Ser: 0.57 mg/dL (ref 0.50–1.10)
GFR calc Af Amer: >90 mL/min (ref >90)
GFR calc non Af Amer: >90 mL/min (ref >90)
Glucose, Bld: 98 mg/dL (ref 70–99)
Sodium: 142 mEq/L (ref 135–145)

## 2012-09-30 LAB — URINE CULTURE

## 2012-09-30 LAB — GI PATHOGEN PANEL BY PCR, STOOL
C difficile toxin A/B: NEGATIVE
Campylobacter by PCR: NEGATIVE
E coli (ETEC) LT/ST: NEGATIVE
E coli (STEC): NEGATIVE
Salmonella by PCR: NEGATIVE

## 2012-09-30 LAB — LACTIC ACID, PLASMA: Lactic Acid, Venous: 1 mmol/L (ref 0.5–2.2)

## 2012-09-30 MED ORDER — POTASSIUM CHLORIDE CRYS ER 20 MEQ PO TBCR
40.0000 meq | EXTENDED_RELEASE_TABLET | Freq: Two times a day (BID) | ORAL | Status: DC
  Administered 2012-09-30 – 2012-10-02 (×5): 40 meq via ORAL
  Filled 2012-09-30 (×8): qty 2

## 2012-09-30 MED ORDER — PANTOPRAZOLE SODIUM 40 MG PO TBEC
80.0000 mg | DELAYED_RELEASE_TABLET | Freq: Every day | ORAL | Status: DC
  Administered 2012-09-30 – 2012-10-02 (×3): 80 mg via ORAL
  Filled 2012-09-30 (×3): qty 2

## 2012-09-30 NOTE — Progress Notes (Signed)
  Date: 09/30/2012  Patient name: [name] Bell  Medical record number: [id number]  Date of birth: 1945-02-15   This patient has been seen and the plan of care was discussed with the house staff. Please see their note for complete details. I concur with their findings with the following additions/corrections: Appreciate GS recs. She is improved from a clinical standpoint. Leukocytosis is improved.  Suspect infectious etiology, not ischemic at this time. Advancing diet. I would continue Unasyn for at least 1 more day then convert to Cipro 500 mg PO bid and Flagyl 500 mg PO tid for 10 more days, unless BC's turn positive.  Jonah Blue, DO, FACP Faculty [facility] Internal Medicine Residency Program 09/30/2012, 12:14 PM

## 2012-09-30 NOTE — Progress Notes (Signed)
Subjective:  Patient feels much improved today. Her abd pain has completely resolved "for the first time in a year." Her nausea has resolved and she is tolerating a clear diet well, last emesis was yesterday at 0600. She takes nexium bid at home and reports last night she a "a little" heart burn. She reamined tachycardic overnight- 101-112. BP stable. No events on telemetry.   Objective: Vital signs in last 24 hours: Filed Vitals:   09/29/12 1400 09/29/12 1758 09/29/12 2052 09/30/12 0450  BP: 124/48 121/95 122/58 118/56  Pulse: 109 112 101 109  Temp: 98.6 F (37 C) 98.4 F (36.9 C) 98.8 F (37.1 C) 98.3 F (36.8 C)  TempSrc: Oral Oral Oral Oral  Resp: 20 20 20 20   Height:      Weight:   71.201 kg (156 lb 15.5 oz)   SpO2: 98% 96% 97% 95%   Weight change: 0.001 kg (0 oz)  Intake/Output Summary (Last 24 hours) at 09/30/12 1610 Last data filed at 09/30/12 0546  Gross per 24 hour  Intake 3301.67 ml  Output   2950 ml  Net 351.67 ml   Physical Exam General: alert, cooperative, and in no apparent distress HEENT: pupils equal round and reactive to light, vision grossly intact, oropharynx clear and non-erythematous  Neck: supple Lungs: scattered wheezing heard primarily in the upper lung fields, normal work of respiration, no rales or ronchi Heart: tachycardic, regular rhythm, no murmurs, gallops, or rubs Abdomen: soft, TTP diffusely, but worst in LLQ; suprapubic region is tender with light touch over an old, well healed abd scar, but patient notes it has always been like this; abdomen is non-distended, normal bowel sounds present Extremities: warm extremities, no BLE edema, SCDs in place Neurologic: alert & oriented X3, strength and sensation grossly intact  Lab Results: Basic Metabolic Panel:  Recent Labs Lab 09/28/12 2203 09/30/12 0550  NA 140 142  K 4.4 3.4*  CL 105 108  CO2 20 24  GLUCOSE 113* 98  BUN 10 5*  CREATININE 0.59 0.57  CALCIUM 10.0 8.2*   Liver  Function Tests:  Recent Labs Lab 09/28/12 2203 09/30/12 0550  AST 18 12  ALT 9 6  ALKPHOS 132* 103  BILITOT 0.2* 0.3  PROT 6.7 5.6*  ALBUMIN 3.3* 2.5*    Recent Labs Lab 09/28/12 2203  LIPASE 23   CBC:  Recent Labs Lab 09/28/12 2203 09/30/12 0550  WBC 16.0* 11.8*  NEUTROABS 13.3* 8.7*  HGB 15.3* 13.4  HCT 43.7 40.0  MCV 87.4 89.7  PLT 297 229   Urine Drug Screen: Drugs of Abuse     Component Value Date/Time   LABOPIA POS* 02/18/2010 2218   COCAINSCRNUR NEG 02/18/2010 2218   LABBENZ NEG 02/18/2010 2218   AMPHETMU NEG 02/18/2010 2218    Urinalysis:  Recent Labs Lab 09/29/12 0158  COLORURINE AMBER*  LABSPEC 1.023  PHURINE 5.0  GLUCOSEU NEGATIVE  HGBUR NEGATIVE  BILIRUBINUR SMALL*  KETONESUR NEGATIVE  PROTEINUR NEGATIVE  UROBILINOGEN 0.2  NITRITE NEGATIVE  LEUKOCYTESUR NEGATIVE   Micro Results: Recent Results (from the past 240 hour(s))  CLOSTRIDIUM DIFFICILE BY PCR     Status: None   Collection Time    09/29/12  8:46 AM      Result Value Range Status   C difficile by pcr NEGATIVE  NEGATIVE Final   Studies/Results: Ct Abdomen Pelvis W Contrast  09/29/2012   *RADIOLOGY REPORT*  Clinical Data: Abdominal pain.  CT ABDOMEN AND PELVIS WITH CONTRAST  Technique:  Multidetector CT imaging of the abdomen and pelvis was performed following the standard protocol during bolus administration of intravenous contrast.  Contrast: OMNIPAQUE IOHEXOL 300 MG/ML  SOLN  Comparison: 08/17/2011.  Findings:  BODY WALL: Unremarkable.  LOWER CHEST:  Mediastinum: Coronary artery atherosclerosis.  Lungs/pleura: Scarring in the lateral left costophrenic sulcus is again seen.  No consolidation.  ABDOMEN/PELVIS:  Liver: No focal abnormality.  Biliary: Distended gallbladder without surrounding inflammatory changes.  Upper limits of normal CBD for age, 7 to 8 mm - unchanged from prior.  Pancreas: Unremarkable.  Spleen: Unremarkable.  Adrenals: Unremarkable.  Kidneys and ureters: No  hydronephrosis or stone.  Bladder: Unremarkable.  Bowel: Circumferential thickening of the distal colon, beginning at the distal transverse colon and continuing to the end colostomy. There is pericolonic fat infiltration, particularly around the colostomy.  No evidence of perforation.  The mucosa and adventitia remains enhancing; there is no major vessel occlusion.  No bowel obstruction. No pericecal inflammatory changes.  Retroperitoneum: No mass or adenopathy.  Peritoneum: No free fluid or gas.  Reproductive: Hysterectomy.  Vascular: Incidental retroaortic left renal vein.  Diffuse atherosclerosis.  OSSEOUS: No acute abnormalities. Remote superior endplate T12 fracture.  Osteopenia.  IMPRESSION: Distal colitis, extending from the distal transverse colon to the descending colostomy. The colitis is nonspecific, and could be infectious, inflammatory, or ischemic.   Original Report Authenticated By: Tiburcio Pea   Medications: I have reviewed the patient's current medications. Scheduled Meds: . ampicillin-sulbactam (UNASYN) IV  3 g Intravenous Q6H  . buPROPion  100 mg Oral TID AC & HS  . heparin  5,000 Units Subcutaneous Q8H   Continuous Infusions: . sodium chloride 100 mL/hr at 09/30/12 0556   PRN Meds:.morphine injection, promethazine  Assessment/Plan:  # Acute Colitis  The patients acute colitis is concerning for infectious, ischemic, and inflammatory etiologies. Infection is the most likely cause as the patient was admitted with leukocytosis (16 with left shift) and tachycardia- WBC count decreased to 11.8 today, although she remained tachycardic with stable BP overnight. However, a stress response to other etiologies could also cause this presentation. The patient is afebrile, making GN bacteremia less likely. As the patient has a history of ischemic colitis and had an elevated lactic acid at 2.4 upon admission--now decreased to 1.0, which makes ischemia less likely. - Blood Cx x2  -stool  pathogen panel -C diff Ag negative -UCx - IV Unasyn - Aggressive Pain Control (patient was on chronic opiate therapy with 15 mg MS Contin BID and Percocet qid prn)  - sugery following- appreciate recs - supp lytes prn  # Tachycardia  The patient's tachycardia was originally thought to be 2/2 hypovolemia, however she has been receiving NS at 100cc/hr since admission and she remains tachycardic. Therefore, her tachycardia may be due to underlying infection.  - s/p 1 L NS in ED - Continue NS at 100 cc/hr   # N/V - resolved. - Phenergan prn  - full liquid diet - restart home dose of ppi (protonix formulary substitute for nexium)  # Depression  Continue home Wellbutrin  Prophylaxis: Heparin sq, SCDs   Nutrition: full liquid diet per [facility]  Dispo: Disposition is deferred at this time, awaiting improvement of current medical problems.  Anticipated discharge in approximately 2 day(s).   The patient does have a current PCP Linward Headland, MD) and does not need an [facility] hospital follow-up appointment after discharge.  The patient does not have transportation limitations that hinder transportation to clinic appointments.  Marland Kitchen  Services Needed at time of discharge: Y = Yes, Blank = No PT:   OT:   RN:   Equipment:   Other:     LOS: 2 days   Windell Hummingbird, MD 09/30/2012, 7:09 AM

## 2012-09-30 NOTE — Progress Notes (Signed)
Patient feels a lot better.  No ostomy prolapse.  CPM.  Advance diet as tolerated.  Marta Lamas. Gae Bon, MD, FACS [phone] [facility]

## 2012-09-30 NOTE — Progress Notes (Signed)
Patient ID: [name] Bell, female   DOB: 1946/01/16, 67 y.o.   MRN: [id number]    Subjective: Pt feels much better today.  Pain is much less.  Tolerating clear liquids with no issues  Objective: Vital signs in last 24 hours: Temp:  [98.3 F (36.8 C)-98.8 F (37.1 C)] 98.3 F (36.8 C) (09/05 0450) Pulse Rate:  [101-115] 109 (09/05 0450) Resp:  [20] 20 (09/05 0450) BP: (118-124)/(30-95) 118/56 mmHg (09/05 0450) SpO2:  [95 %-98 %] 95 % (09/05 0450) Weight:  [156 lb 15.5 oz (71.201 kg)] 156 lb 15.5 oz (71.201 kg) (09/04 2052) Last BM Date: 09/29/12  Intake/Output from previous day: 09/04 0701 - 09/05 0700 In: 3301.7 [P.O.:840; I.V.:2161.7; IV Piggyback:300] Out: 2950 [Urine:2650; Stool:300] Intake/Output this shift:    PE: Abd: soft, much less tender, ostomy with flatus present, +BS  Lab Results:   Recent Labs  09/28/12 2203 09/30/12 0550  WBC 16.0* 11.8*  HGB 15.3* 13.4  HCT 43.7 40.0  PLT 297 229   BMET  Recent Labs  09/28/12 2203 09/30/12 0550  NA 140 142  K 4.4 3.4*  CL 105 108  CO2 20 24  GLUCOSE 113* 98  BUN 10 5*  CREATININE 0.59 0.57  CALCIUM 10.0 8.2*   PT/INR No results found for this basename: LABPROT, INR,  in the last 72 hours CMP     Component Value Date/Time   NA 142 09/30/2012 0550   K 3.4* 09/30/2012 0550   CL 108 09/30/2012 0550   CO2 24 09/30/2012 0550   GLUCOSE 98 09/30/2012 0550   BUN 5* 09/30/2012 0550   CREATININE 0.57 09/30/2012 0550   CREATININE 0.67 05/11/2012 1406   CALCIUM 8.2* 09/30/2012 0550   PROT 5.6* 09/30/2012 0550   ALBUMIN 2.5* 09/30/2012 0550   AST 12 09/30/2012 0550   ALT 6 09/30/2012 0550   ALKPHOS 103 09/30/2012 0550   BILITOT 0.3 09/30/2012 0550   GFRNONAA >90 09/30/2012 0550   GFRAA >90 09/30/2012 0550   Lipase     Component Value Date/Time   LIPASE 23 09/28/2012 2203       Studies/Results: Ct Abdomen Pelvis W Contrast  09/29/2012   *RADIOLOGY REPORT*  Clinical Data: Abdominal pain.  CT ABDOMEN AND PELVIS WITH CONTRAST   Technique:  Multidetector CT imaging of the abdomen and pelvis was performed following the standard protocol during bolus administration of intravenous contrast.  Contrast: OMNIPAQUE IOHEXOL 300 MG/ML  SOLN  Comparison: 08/17/2011.  Findings:  BODY WALL: Unremarkable.  LOWER CHEST:  Mediastinum: Coronary artery atherosclerosis.  Lungs/pleura: Scarring in the lateral left costophrenic sulcus is again seen.  No consolidation.  ABDOMEN/PELVIS:  Liver: No focal abnormality.  Biliary: Distended gallbladder without surrounding inflammatory changes.  Upper limits of normal CBD for age, 7 to 8 mm - unchanged from prior.  Pancreas: Unremarkable.  Spleen: Unremarkable.  Adrenals: Unremarkable.  Kidneys and ureters: No hydronephrosis or stone.  Bladder: Unremarkable.  Bowel: Circumferential thickening of the distal colon, beginning at the distal transverse colon and continuing to the end colostomy. There is pericolonic fat infiltration, particularly around the colostomy.  No evidence of perforation.  The mucosa and adventitia remains enhancing; there is no major vessel occlusion.  No bowel obstruction. No pericecal inflammatory changes.  Retroperitoneum: No mass or adenopathy.  Peritoneum: No free fluid or gas.  Reproductive: Hysterectomy.  Vascular: Incidental retroaortic left renal vein.  Diffuse atherosclerosis.  OSSEOUS: No acute abnormalities. Remote superior endplate T12 fracture.  Osteopenia.  IMPRESSION:  Distal colitis, extending from the distal transverse colon to the descending colostomy. The colitis is nonspecific, and could be infectious, inflammatory, or ischemic.   Original Report Authenticated By: Tiburcio Pea    Anti-infectives: Anti-infectives   Start     Dose/Rate Route Frequency Ordered Stop   09/29/12 1300  Ampicillin-Sulbactam (UNASYN) 3 g in sodium chloride 0.9 % 100 mL IVPB     3 g 100 mL/hr over 60 Minutes Intravenous Every 6 hours 09/29/12 1231     09/29/12 0315  metroNIDAZOLE (FLAGYL)  IVPB 500 mg  Status:  Discontinued     500 mg 100 mL/hr over 60 Minutes Intravenous  Once 09/29/12 0314 09/29/12 0314   09/29/12 0315  ciprofloxacin (CIPRO) IVPB 400 mg  Status:  Discontinued     400 mg 200 mL/hr over 60 Minutes Intravenous  Once 09/29/12 0314 09/29/12 0314   09/29/12 0315  piperacillin-tazobactam (ZOSYN) IVPB 3.375 g     3.375 g 12.5 mL/hr over 240 Minutes Intravenous  Once 09/29/12 0314 09/29/12 0505       Assessment/Plan  1. Colitis Patient Active Problem List   Diagnosis Date Noted  . Acute colitis 09/29/2012  . Macular degeneration 01/14/2012  . Preventative health care 01/14/2012  . COPD (chronic obstructive pulmonary disease) 11/25/2011  . Colostomy in place 10/27/2011  . Physical deconditioning 08/26/2011  . Diverticulitis of colon with perforation 07/30/2011  . Constipation 07/17/2010  . HYPERLIPIDEMIA 01/24/2009  . Chronic pain syndrome 01/03/2009  . DEPRESSION 03/08/2008  . TEMPOROMANDIBULAR JOINT PAIN 12/01/2007  . OSTEOPOROSIS 03/03/2007  . ACUTE DUODENAL ULCER W/HEMORRHAGE&OBSTRUCTION 12/28/2006  . TOBACCO ABUSE 12/02/2006  . VITAMIN B12 DEFICIENCY 11/03/2006   Plan: 1. Will advance to full liquids given pain much improve, no fevers, and WBC decreased to 11K. 2. Cont abx therapy.  No evidence for surgical intervention.   LOS: 2 days    Brinn Westby E 09/30/2012, 8:45 AM Pager: [phone]

## 2012-09-30 NOTE — Progress Notes (Signed)
ANTIBIOTIC CONSULT NOTE - FOLLOW UP  Pharmacy Consult for Unasyn Indication: Empiric colitis coverage  Allergies  Allergen Reactions  . Ibuprofen     REACTION: Bleeding ulcers  . Latex Itching  . Nsaids     REACTION: Bleeding Ulcer    Patient Measurements: Height: 5\' 4"  (162.6 cm) Weight: 156 lb 15.5 oz (71.201 kg) IBW/kg (Calculated) : 54.7  Vital Signs: Temp: 98.2 F (36.8 C) (09/05 0919) Temp src: Oral (09/05 0450) BP: 114/45 mmHg (09/05 0919) Pulse Rate: 96 (09/05 0919) Intake/Output from previous day: 09/04 0701 - 09/05 0700 In: 3301.7 [P.O.:840; I.V.:2161.7; IV Piggyback:300] Out: 2950 [Urine:2650; Stool:300] Intake/Output from this shift: Total I/O In: 480 [P.O.:480] Out: 600 [Urine:600]  Labs:  Recent Labs  09/28/12 2203 09/30/12 0550  WBC 16.0* 11.8*  HGB 15.3* 13.4  PLT 297 229  CREATININE 0.59 0.57   Estimated Creatinine Clearance: 64.2 ml/min (by C-G formula based on Cr of 0.57). No results found for this basename: VANCOTROUGH, Leodis Binet, VANCORANDOM, GENTTROUGH, GENTPEAK, GENTRANDOM, TOBRATROUGH, TOBRAPEAK, TOBRARND, AMIKACINPEAK, AMIKACINTROU, AMIKACIN,  in the last 72 hours   Microbiology: Recent Results (from the past 720 hour(s))  URINE CULTURE     Status: None   Collection Time    09/29/12  1:57 AM      Result Value Range Status   Specimen Description URINE, CLEAN CATCH   Final   Special Requests NONE   Final   Culture  Setup Time     Final   Value: 09/29/2012 10:24     Performed at [facility] Count     Final   Value: NO GROWTH     Performed at [facility]   Culture     Final   Value: NO GROWTH     Performed at [facility]   Report Status 09/30/2012 FINAL   Final  CULTURE, BLOOD (ROUTINE X 2)     Status: None   Collection Time    09/29/12  4:30 AM      Result Value Range Status   Specimen Description BLOOD LEFT ARM   Final   Special Requests BOTTLES DRAWN AEROBIC AND ANAEROBIC 5CC   Final   Culture  Setup Time     Final   Value: 09/29/2012 10:06     Performed at [facility]   Culture     Final   Value:        BLOOD CULTURE RECEIVED NO GROWTH TO DATE CULTURE WILL BE HELD FOR 5 DAYS BEFORE ISSUING A FINAL NEGATIVE REPORT     Performed at [facility]   Report Status PENDING   Incomplete  CULTURE, BLOOD (ROUTINE X 2)     Status: None   Collection Time    09/29/12  4:40 AM      Result Value Range Status   Specimen Description BLOOD LEFT HAND   Final   Special Requests BOTTLES DRAWN AEROBIC ONLY 10CC   Final   Culture  Setup Time     Final   Value: 09/29/2012 10:07     Performed at [facility]   Culture     Final   Value:        BLOOD CULTURE RECEIVED NO GROWTH TO DATE CULTURE WILL BE HELD FOR 5 DAYS BEFORE ISSUING A FINAL NEGATIVE REPORT     Performed at [facility]   Report Status PENDING   Incomplete  CLOSTRIDIUM DIFFICILE BY PCR  Status: None   Collection Time    09/29/12  8:46 AM      Result Value Range Status   C difficile by pcr NEGATIVE  NEGATIVE Final    Anti-infectives   Start     Dose/Rate Route Frequency Ordered Stop   09/29/12 1300  Ampicillin-Sulbactam (UNASYN) 3 g in sodium chloride 0.9 % 100 mL IVPB     3 g 100 mL/hr over 60 Minutes Intravenous Every 6 hours 09/29/12 1231     09/29/12 0315  metroNIDAZOLE (FLAGYL) IVPB 500 mg  Status:  Discontinued     500 mg 100 mL/hr over 60 Minutes Intravenous  Once 09/29/12 0314 09/29/12 0314   09/29/12 0315  ciprofloxacin (CIPRO) IVPB 400 mg  Status:  Discontinued     400 mg 200 mL/hr over 60 Minutes Intravenous  Once 09/29/12 0314 09/29/12 0314   09/29/12 0315  piperacillin-tazobactam (ZOSYN) IVPB 3.375 g     3.375 g 12.5 mL/hr over 240 Minutes Intravenous  Once 09/29/12 0314 09/29/12 0505      Assessment: 67 y.o. F who continues on Unasyn total abx D#2 for acute colitis coverage. Afebrile, WBC trending down (11.8 << 16), renal function is stable, dose remains appropriate  at this time.   Zosyn 9/4 >>9/4 Unasyn 9/4 >>  9/4 BCx >> ngtd 9/4 UCx >> NG 9/4 CDiff >> negative  Goal of Therapy:  Proper antibiotics for infection/cultures adjusted for renal/hepatic function   Plan:  1. Continue Unasyn 3g IV every 6 hours 2. Will continue to follow renal function, culture results, LOT, and antibiotic de-escalation plans   Georgina Pillion, PharmD, BCPS Clinical Pharmacist Pager: [phone] 09/30/2012 11:11 AM

## 2012-10-01 LAB — CBC WITH DIFFERENTIAL/PLATELET
Basophils Relative: 0 % (ref 0–1)
Eosinophils Absolute: 0 10*3/uL (ref 0.0–0.7)
Lymphs Abs: 2.4 10*3/uL (ref 0.7–4.0)
MCH: 29.9 pg (ref 26.0–34.0)
Neutrophils Relative %: 60 % (ref 43–77)
Platelets: 197 10*3/uL (ref 150–400)
RBC: 3.84 MIL/uL — ABNORMAL LOW (ref 3.87–5.11)

## 2012-10-01 LAB — BASIC METABOLIC PANEL
GFR calc Af Amer: >90 mL/min (ref >90)
GFR calc non Af Amer: >90 mL/min (ref >90)
Glucose, Bld: 91 mg/dL (ref 70–99)
Potassium: 4 mEq/L (ref 3.5–5.1)
Sodium: 143 mEq/L (ref 135–145)

## 2012-10-01 NOTE — Progress Notes (Signed)
Patient ID: [name] Bell, female   DOB: 1945-04-02, 67 y.o.   MRN: [id number]    Subjective: Pt continues to feel better.  Tolerating full liquids, but mostly dairy and is somewhat lactose intolerant.  Objective: Vital signs in last 24 hours: Temp:  [97.7 F (36.5 C)-98.7 F (37.1 C)] 98.5 F (36.9 C) (09/06 0640) Pulse Rate:  [85-103] 85 (09/06 0640) Resp:  [18-20] 20 (09/06 0640) BP: (113-125)/(29-48) 117/47 mmHg (09/06 0640) SpO2:  [92 %-96 %] 96 % (09/06 0640) Last BM Date: 09/30/12  Intake/Output from previous day: 09/05 0701 - 09/06 0700 In: 4108.3 [P.O.:1460; I.V.:2348.3; IV Piggyback:300] Out: 1000 [Urine:1000] Intake/Output this shift:    PE: Abd: soft, tender around peristomal hernia, B+S, less tender, ostomy with gas and output  Lab Results:   Recent Labs  09/30/12 0550 10/01/12 0415  WBC 11.8* 7.1  HGB 13.4 11.5*  HCT 40.0 34.8*  PLT 229 197   BMET  Recent Labs  09/30/12 0550 10/01/12 0415  NA 142 143  K 3.4* 4.0  CL 108 111  CO2 24 25  GLUCOSE 98 91  BUN 5* 4*  CREATININE 0.57 0.54  CALCIUM 8.2* 8.3*   PT/INR No results found for this basename: LABPROT, INR,  in the last 72 hours CMP     Component Value Date/Time   NA 143 10/01/2012 0415   K 4.0 10/01/2012 0415   CL 111 10/01/2012 0415   CO2 25 10/01/2012 0415   GLUCOSE 91 10/01/2012 0415   BUN 4* 10/01/2012 0415   CREATININE 0.54 10/01/2012 0415   CREATININE 0.67 05/11/2012 1406   CALCIUM 8.3* 10/01/2012 0415   PROT 5.6* 09/30/2012 0550   ALBUMIN 2.5* 09/30/2012 0550   AST 12 09/30/2012 0550   ALT 6 09/30/2012 0550   ALKPHOS 103 09/30/2012 0550   BILITOT 0.3 09/30/2012 0550   GFRNONAA >90 10/01/2012 0415   GFRAA >90 10/01/2012 0415   Lipase     Component Value Date/Time   LIPASE 23 09/28/2012 2203       Studies/Results: No results found.  Anti-infectives: Anti-infectives   Start     Dose/Rate Route Frequency Ordered Stop   09/29/12 1300  Ampicillin-Sulbactam (UNASYN) 3 g in sodium chloride 0.9 %  100 mL IVPB     3 g 100 mL/hr over 60 Minutes Intravenous Every 6 hours 09/29/12 1231     09/29/12 0315  metroNIDAZOLE (FLAGYL) IVPB 500 mg  Status:  Discontinued     500 mg 100 mL/hr over 60 Minutes Intravenous  Once 09/29/12 0314 09/29/12 0314   09/29/12 0315  ciprofloxacin (CIPRO) IVPB 400 mg  Status:  Discontinued     400 mg 200 mL/hr over 60 Minutes Intravenous  Once 09/29/12 0314 09/29/12 0314   09/29/12 0315  piperacillin-tazobactam (ZOSYN) IVPB 3.375 g     3.375 g 12.5 mL/hr over 240 Minutes Intravenous  Once 09/29/12 0314 09/29/12 0505       Assessment/Plan  1. Colitis 2. Peristomal hernia Patient Active Problem List   Diagnosis Date Noted  . Acute colitis 09/29/2012  . Macular degeneration 01/14/2012  . Preventative health care 01/14/2012  . COPD (chronic obstructive pulmonary disease) 11/25/2011  . Colostomy in place 10/27/2011  . Physical deconditioning 08/26/2011  . Diverticulitis of colon with perforation 07/30/2011  . Constipation 07/17/2010  . HYPERLIPIDEMIA 01/24/2009  . Chronic pain syndrome 01/03/2009  . DEPRESSION 03/08/2008  . TEMPOROMANDIBULAR JOINT PAIN 12/01/2007  . OSTEOPOROSIS 03/03/2007  . ACUTE DUODENAL ULCER W/HEMORRHAGE&OBSTRUCTION 12/28/2006  .  TOBACCO ABUSE 12/02/2006  . VITAMIN B12 DEFICIENCY 11/03/2006   Plan: 1. Advance to low fiber diet.  If patient tolerates this, she is stable from a surgical standpoint for dc when medically stable.  She should follow up with Dr. Michaell Cowing in the office for further evaluation or management of peristomal hernia as needed.  Please call with further questions.  We will sign off.   LOS: 3 days    Nakea Gouger E 10/01/2012, 10:27 AM Pager: [phone]

## 2012-10-01 NOTE — Progress Notes (Signed)
Agree with A&P of KO,PA. She had a little nausea but related it to chewing her potassium tabs

## 2012-10-01 NOTE — Progress Notes (Signed)
Subjective:  Patient complains of some abd pain today, but reports that her last dose of pain medication was at 0500 and her IV infiltrated, so they have been unable to give her next dose of pain medication. She reports that her pain is still significantly improved from what it was when she was admitted and is currently about the level of her baseline abd pain. She denies nausea and vomiting. She is tolerating a full liquid diet well.   Objective: Vital signs in last 24 hours: Filed Vitals:   09/30/12 1359 09/30/12 1656 09/30/12 2259 10/01/12 0640  BP: 113/29 124/48 125/43 117/47  Pulse: 98 103 93 85  Temp: 98.3 F (36.8 C) 98.7 F (37.1 C) 97.7 F (36.5 C) 98.5 F (36.9 C)  TempSrc:   Oral Oral  Resp: 18 20 18 20   Height:      Weight:      SpO2: 96% 92% 95% 96%   Weight change:   Intake/Output Summary (Last 24 hours) at 10/01/12 1010 Last data filed at 10/01/12 0515  Gross per 24 hour  Intake 3628.33 ml  Output    400 ml  Net 3228.33 ml   Physical Exam General: alert, cooperative, and in no apparent distress HEENT: pupils equal round and reactive to light, vision grossly intact, oropharynx clear and non-erythematous  Neck: supple Lungs: CTAB, normal work of respiration, no rales or ronchi Heart: RRR, no murmurs, gallops, or rubs Abdomen: soft, TTP diffusely, but worst in LLQ; suprapubic region is tender with light touch over an old, well healed abd scar, but patient notes it has always been like this; abdomen is non-distended, normal bowel sounds present Extremities: warm extremities, no BLE edema, SCDs in place Neurologic: alert & oriented X3, strength and sensation grossly intact  Lab Results: Basic Metabolic Panel:  Recent Labs Lab 09/30/12 0550 10/01/12 0415  NA 142 143  K 3.4* 4.0  CL 108 111  CO2 24 25  GLUCOSE 98 91  BUN 5* 4*  CREATININE 0.57 0.54  CALCIUM 8.2* 8.3*   Liver Function Tests:  Recent Labs Lab 09/28/12 2203 09/30/12 0550  AST 18  12  ALT 9 6  ALKPHOS 132* 103  BILITOT 0.2* 0.3  PROT 6.7 5.6*  ALBUMIN 3.3* 2.5*    Recent Labs Lab 09/28/12 2203  LIPASE 23   CBC:  Recent Labs Lab 09/30/12 0550 10/01/12 0415  WBC 11.8* 7.1  NEUTROABS 8.7* 4.2  HGB 13.4 11.5*  HCT 40.0 34.8*  MCV 89.7 90.6  PLT 229 197   Urine Drug Screen: Drugs of Abuse     Component Value Date/Time   LABOPIA POS* 02/18/2010 2218   COCAINSCRNUR NEG 02/18/2010 2218   LABBENZ NEG 02/18/2010 2218   AMPHETMU NEG 02/18/2010 2218    Urinalysis:  Recent Labs Lab 09/29/12 0158  COLORURINE AMBER*  LABSPEC 1.023  PHURINE 5.0  GLUCOSEU NEGATIVE  HGBUR NEGATIVE  BILIRUBINUR SMALL*  KETONESUR NEGATIVE  PROTEINUR NEGATIVE  UROBILINOGEN 0.2  NITRITE NEGATIVE  LEUKOCYTESUR NEGATIVE   Micro Results: Recent Results (from the past 240 hour(s))  URINE CULTURE     Status: None   Collection Time    09/29/12  1:57 AM      Result Value Range Status   Specimen Description URINE, CLEAN CATCH   Final   Special Requests NONE   Final   Culture  Setup Time     Final   Value: 09/29/2012 10:24     Performed at [facility]  Colony Count     Final   Value: NO GROWTH     Performed at [facility]   Culture     Final   Value: NO GROWTH     Performed at [facility]   Report Status 09/30/2012 FINAL   Final  CULTURE, BLOOD (ROUTINE X 2)     Status: None   Collection Time    09/29/12  4:30 AM      Result Value Range Status   Specimen Description BLOOD LEFT ARM   Final   Special Requests BOTTLES DRAWN AEROBIC AND ANAEROBIC 5CC   Final   Culture  Setup Time     Final   Value: 09/29/2012 10:06     Performed at [facility]   Culture     Final   Value:        BLOOD CULTURE RECEIVED NO GROWTH TO DATE CULTURE WILL BE HELD FOR 5 DAYS BEFORE ISSUING A FINAL NEGATIVE REPORT     Performed at [facility]   Report Status PENDING   Incomplete  CULTURE, BLOOD (ROUTINE X 2)     Status: None   Collection Time     09/29/12  4:40 AM      Result Value Range Status   Specimen Description BLOOD LEFT HAND   Final   Special Requests BOTTLES DRAWN AEROBIC ONLY 10CC   Final   Culture  Setup Time     Final   Value: 09/29/2012 10:07     Performed at [facility]   Culture     Final   Value:        BLOOD CULTURE RECEIVED NO GROWTH TO DATE CULTURE WILL BE HELD FOR 5 DAYS BEFORE ISSUING A FINAL NEGATIVE REPORT     Performed at [facility]   Report Status PENDING   Incomplete  CLOSTRIDIUM DIFFICILE BY PCR     Status: None   Collection Time    09/29/12  8:46 AM      Result Value Range Status   C difficile by pcr NEGATIVE  NEGATIVE Final   Studies/Results: No results found. Medications: I have reviewed the patient's current medications. Scheduled Meds: . ampicillin-sulbactam (UNASYN) IV  3 g Intravenous Q6H  . buPROPion  100 mg Oral TID AC & HS  . heparin  5,000 Units Subcutaneous Q8H  . pantoprazole  80 mg Oral Q1200  . potassium chloride  40 mEq Oral BID   Continuous Infusions: . sodium chloride 100 mL/hr at 09/30/12 2212   PRN Meds:.morphine injection, promethazine  Assessment/Plan:  # Acute Colitis  The patients acute colitis is concerning for infectious vs ischemic etiologies. Infection is the most likely cause as the patient was admitted with leukocytosis (16 with left shift) and tachycardia. WBC count has been downtrending, now at 7.1. Pt had been tachycardic, but this normalized overnight. She has remained afebrile throughout her hospitalization. As the patient has a history of ischemic colitis and had an elevated lactic acid at 2.4 upon admission, which then decreased to 1.0, which makes ischemia much less likely. - Blood Cx x2 - NGTD -stool pathogen panel- all negative so far -C diff Ag negative -UCx- NGTD - IV Unasyn - Aggressive Pain Control (patient was on chronic opiate therapy with 15 mg MS Contin BID and Percocet qid prn)  - sugery following- appreciate recs - supp  lytes prn- K had been low, but now stable after supplementation  # Tachycardia - resolved last  night. The patient's tachycardia was originally thought to be 2/2 hypovolemia, however after receiving NS at 100cc/hr for two days, she was still tachycardic. This is likely SIRS response to infection, and after three days of IV abx, it has resolved. BP has remained stable throughout.  - s/p 1 L NS in ED - tolerating diet well, d/c fluids  -discontinue tele   # N/V - resolved. - Phenergan prn  - full liquid diet- will advance per [facility] - home dose of ppi (protonix formulary substitute for nexium)  # Depression - Continue home Wellbutrin  Prophylaxis: Heparin sq, SCDs   Nutrition: full liquid diet per [facility]  Dispo: Disposition is deferred at this time, awaiting improvement of current medical problems.  Anticipated discharge in approximately 1-2 day(s).   The patient does have a current PCP Linward Headland, MD) and does not need an [facility] hospital follow-up appointment after discharge.  The patient does not have transportation limitations that hinder transportation to clinic appointments.  .Services Needed at time of discharge: Y = Yes, Blank = No PT:   OT:   RN:   Equipment:   Other:     LOS: 3 days   Windell Hummingbird, MD 10/01/2012, 10:10 AM

## 2012-10-01 NOTE — Discharge Summary (Signed)
Name: [name] Bell MRN: [id number] DOB: 04-Dec-1945 66 y.o. PCP: Linward Headland, MD  Date of Admission: 09/28/2012  8:49 PM Date of Discharge: 10/02/2012 Attending Physician: Jonah Blue, DO  Discharge Diagnosis: Principal Problem:   Acute colitis- likely infectious  Active Problems:   Depression   Chronic abdominal pain   Diverticulitis of colon with perforation w/ colostomy   Ischemic colitis 2/2 diverticular perforation  Discharge Medications:   Medication List         buPROPion 100 MG tablet  Commonly known as:  WELLBUTRIN  Take 100 mg by mouth 4 (four) times daily.     calcium-vitamin D 500-200 MG-UNIT per tablet  Commonly known as:  OSCAL WITH D  Take 1 tablet by mouth 3 (three) times daily.     cholecalciferol 1000 UNITS tablet  Commonly known as:  VITAMIN D  Take 1,000 Units by mouth 3 (three) times daily.     ciprofloxacin 500 MG tablet  Commonly known as:  CIPRO  Take 1 tablet (500 mg total) by mouth 2 (two) times daily.     docusate sodium 100 MG capsule  Commonly known as:  COLACE  Take 100 mg by mouth daily as needed for constipation.     esomeprazole 40 MG capsule  Commonly known as:  NEXIUM  Take 1 capsule (40 mg total) by mouth 2 (two) times daily.     HYDROcodone-acetaminophen 7.5-325 MG per tablet  Commonly known as:  NORCO  Take 1 tablet by mouth every 6 (six) hours as needed for pain.     methocarbamol 750 MG tablet  Commonly known as:  ROBAXIN  Take 1 tablet (750 mg total) by mouth 4 (four) times daily as needed.     metroNIDAZOLE 500 MG tablet  Commonly known as:  FLAGYL  Take 1 tablet (500 mg total) by mouth 3 (three) times daily.     morphine 15 MG 12 hr tablet  Commonly known as:  MS CONTIN  Take 1 tablet (15 mg total) by mouth every 12 (twelve) hours.     pravastatin 80 MG tablet  Commonly known as:  PRAVACHOL  Take 1 tablet (80 mg total) by mouth daily.     promethazine 12.5 MG tablet  Commonly known as:  PHENERGAN  Take  1 tablet (12.5 mg total) by mouth every 6 (six) hours as needed.       Disposition and follow-up:   [name] was discharged from [facility] in Stable condition.  At the hospital follow up visit please address:  1.  Infectious colitis- patient was much improved upon discharge, but please assess for resolution; have pt follow up with general surgery for peristomal hernia as needed  2.  Labs / imaging needed at time of follow-up: BMP (postassium)  3.  Pending labs/ test needing follow-up: BCx, UCx- both NGTD at time of discharge  Follow-up Appointments: Follow-up Information   Schedule an appointment as soon as possible for a visit with Ardeth Sportsman., MD. (As needed)    Specialty:  General Surgery   Contact information:   [address] [place] [address] [phone]       Follow up with Janalyn Harder, MD. Schedule an appointment as soon as possible for a visit in 2 weeks.   Specialty:  Internal Medicine   Contact information:   [address] [place] [address] [phone]     Please follow up with Dr. Michaell Cowing with  General Surgery for management of peristomal hernia.  Discharge Instructions:   Consultations:  surgery  Procedures Performed:  Ct Abdomen Pelvis W Contrast  09/29/2012   *RADIOLOGY REPORT*  Clinical Data: Abdominal pain.  CT ABDOMEN AND PELVIS WITH CONTRAST  Technique:  Multidetector CT imaging of the abdomen and pelvis was performed following the standard protocol during bolus administration of intravenous contrast.  Contrast: OMNIPAQUE IOHEXOL 300 MG/ML  SOLN  Comparison: 08/17/2011.  Findings:  BODY WALL: Unremarkable.  LOWER CHEST:  Mediastinum: Coronary artery atherosclerosis.  Lungs/pleura: Scarring in the lateral left costophrenic sulcus is again seen.  No consolidation.  ABDOMEN/PELVIS:  Liver: No focal abnormality.  Biliary: Distended gallbladder without surrounding inflammatory changes.  Upper limits of  normal CBD for age, 7 to 8 mm - unchanged from prior.  Pancreas: Unremarkable.  Spleen: Unremarkable.  Adrenals: Unremarkable.  Kidneys and ureters: No hydronephrosis or stone.  Bladder: Unremarkable.  Bowel: Circumferential thickening of the distal colon, beginning at the distal transverse colon and continuing to the end colostomy. There is pericolonic fat infiltration, particularly around the colostomy.  No evidence of perforation.  The mucosa and adventitia remains enhancing; there is no major vessel occlusion.  No bowel obstruction. No pericecal inflammatory changes.  Retroperitoneum: No mass or adenopathy.  Peritoneum: No free fluid or gas.  Reproductive: Hysterectomy.  Vascular: Incidental retroaortic left renal vein.  Diffuse atherosclerosis.  OSSEOUS: No acute abnormalities. Remote superior endplate T12 fracture.  Osteopenia.  IMPRESSION: Distal colitis, extending from the distal transverse colon to the descending colostomy. The colitis is nonspecific, and could be infectious, inflammatory, or ischemic.   Original Report Authenticated By: Tiburcio Pea   Admission HPI:  [name] Bell is a 67 y.o. woman with a history of ischemic colitis, ruptured diverticula s/p colostomy 1 year ago and chronic pain who presents with a cc of abdominal pain. The patients abdominal pain has been increasing for the last month gradually. The pain is located in the LLQ surrounding her ostomy. The pain has been much worse since running out of her MS Contin 3 days ago. Since then, the pain has been noticeably increasing. She has been avoiding coming to the hospital as today is the 1 year anniversary of her discharge from the hospital after her ruptured colon. She admits a couple days of nausea and vomiting. She admits decreased oral intake of food and water during this time. She does not recall a change in ostomy output. She denies bloody output from her ostomy. Denies dysuria, SOB, Chest Pain. Admits to cough. Denies fevers,  but admits chills.   Hospital Course by problem list: # Acute Colitis- This patient has hx of ischemic colitis last year and presented with LLQ abd pain with CT scan revealing distal colitis. She also presented with leukocytosis with left shift and tachycardia, though remained normotensive and afebrile throughout her hospitalization. Therefore, infectious colitis remained our leading diagnosis. We did assess for ischemic colitis given she presented with lactic acid of 2.4, however this decreased to 1 overnight so we did not favor this diagnosis. Stool pathogen panel and C diff were negative. BCx, UCx NGTD. Gen surgery was consulted and recommended medical management with IV hydration, IV abx, aggressive pain control, and slow advancement of her diet. Patient was managed on NS @ 100cc/hr, IV Unasyn x 4 days and her diet was slowly advanced, at time of discharge she was tolerating low fiber diet. With treatment, WBC trended down to wnl and pain and nausea were both  very well controlled. Potassium was low at 3.4 on admission, and this was supplemented. We discharged her on ciprofloxacin 500mg  bid and flagyl 500mg  po TIDx 10 days. Surgery recommended following up with Dr. Michaell Cowing as needed for management of her peristomal hernia.  # Tachycardia - Patient presented with tachycardia to 115, thought to be 2/2 hypovolemia, but did not respond to fluid resuscitation. Therefore, it is likely that this was SIRS response to infectious colitis given tachycardia resolved after she received 2 days antibiotic therapy. She was monitored on telemetry until hospital day 3- no significant events on tele.   # N/V - Patient came in with N/V, however this was easily controled with Phenergan prn. She did not have any episodes of emesis during her admission. We slowly advanced her diet and at time of discharge she was tolerating a low fiber diet well. Patient is on nexium at home and c/o GERD symptoms during her hospitalization at  which point protonix was started.   # Depression - Continued home Wellbutrin.   Discharge Vitals:   BP 90/46  Pulse 94  Temp(Src) 98.2 F (36.8 C) (Oral)  Resp 20  Ht 5\' 4"  (1.626 m)  Wt 158 lb 11.7 oz (72 kg)  BMI 27.23 kg/m2  SpO2 100%  LMP 08/25/1980  Discharge Labs:  Results for orders placed during the hospital encounter of 09/28/12 (from the past 24 hour(s))  BASIC METABOLIC PANEL     Status: Abnormal   Collection Time    10/02/12  5:42 AM      Result Value Range   Sodium 143  135 - 145 mEq/L   Potassium 3.9  3.5 - 5.1 mEq/L   Chloride 107  96 - 112 mEq/L   CO2 27  19 - 32 mEq/L   Glucose, Bld 77  70 - 99 mg/dL   BUN 4 (*) 6 - 23 mg/dL   Creatinine, Ser 4.09  0.50 - 1.10 mg/dL   Calcium 8.6  8.4 - 81.1 mg/dL   GFR calc non Af Amer >90  >90 mL/min   GFR calc Af Amer >90  >90 mL/min  CBC     Status: None   Collection Time    10/02/12  5:42 AM      Result Value Range   WBC 5.4  4.0 - 10.5 K/uL   RBC 4.35  3.87 - 5.11 MIL/uL   Hemoglobin 13.1  12.0 - 15.0 g/dL   HCT 91.4  78.2 - 95.6 %   MCV 90.8  78.0 - 100.0 fL   MCH 30.1  26.0 - 34.0 pg   MCHC 33.2  30.0 - 36.0 g/dL   RDW 21.3  08.6 - 57.8 %   Platelets 210  150 - 400 K/uL    Signed: Genelle Gather, MD 10/02/2012, 12:32 PM   Time Spent on Discharge: 35 minutes Services Ordered on Discharge: none Equipment Ordered on Discharge: none

## 2012-10-02 LAB — CBC
MCH: 30.1 pg (ref 26.0–34.0)
MCV: 90.8 fL (ref 78.0–100.0)
Platelets: 210 10*3/uL (ref 150–400)
RDW: 14.6 % (ref 11.5–15.5)

## 2012-10-02 LAB — BASIC METABOLIC PANEL
CO2: 27 mEq/L (ref 19–32)
Calcium: 8.6 mg/dL (ref 8.4–10.5)
Creatinine, Ser: 0.6 mg/dL (ref 0.50–1.10)
GFR calc non Af Amer: >90 mL/min (ref >90)
Glucose, Bld: 77 mg/dL (ref 70–99)

## 2012-10-02 MED ORDER — CIPROFLOXACIN HCL 500 MG PO TABS: 500.0000 mg | ORAL_TABLET | Freq: Two times a day (BID) | ORAL | Status: DC

## 2012-10-02 MED ORDER — METRONIDAZOLE 500 MG PO TABS: 500.0000 mg | ORAL_TABLET | Freq: Three times a day (TID) | ORAL | Status: DC

## 2012-10-02 MED ORDER — HYDROCODONE-ACETAMINOPHEN 7.5-325 MG PO TABS: 1.0000 | ORAL_TABLET | Freq: Four times a day (QID) | ORAL | Status: DC | PRN

## 2012-10-02 NOTE — Progress Notes (Signed)
Subjective: No acute events. Pt cleared by Surgery for discharge. Pt states that she is doing well today. She states that her pain is still present but remains greatly improved from admission. She states that she would like to go home today.   Objective: Vital signs in last 24 hours: Filed Vitals:   10/02/12 0009 10/02/12 0522 10/02/12 0531 10/02/12 0937  BP: 105/64 96/33 119/40 90/46  Pulse: 89 83 80 94  Temp: 98.1 F (36.7 C) 97.9 F (36.6 C)  98.2 F (36.8 C)  TempSrc: Oral Oral  Oral  Resp: 20 18  20   Height: 5\' 4"  (1.626 m)     Weight: 158 lb 11.7 oz (72 kg)     SpO2: 94% 98%  100%   Weight change:   Intake/Output Summary (Last 24 hours) at 10/02/12 1210 Last data filed at 10/02/12 0900  Gross per 24 hour  Intake    580 ml  Output    250 ml  Net    330 ml   Physical Exam General: alert, cooperative, and in no apparent distress HEENT: pupils equal round and reactive to light, vision grossly intact, oropharynx clear and non-erythematous  Neck: supple Lungs: CTAB, normal work of respiration, no rales or ronchi Heart: RRR, no murmurs, gallops, or rubs Abdomen: soft, mild, diffuse TTP, but worst in LLQ around stoma; abdomen is non-distended, normal bowel sounds present Extremities: warm extremities, no BLE edema, SCDs in place Neurologic: alert & oriented X3, strength and sensation grossly intact in bilateral upper and lower extremities   Lab Results: Basic Metabolic Panel:  Recent Labs Lab 10/01/12 0415 10/02/12 0542  NA 143 143  K 4.0 3.9  CL 111 107  CO2 25 27  GLUCOSE 91 77  BUN 4* 4*  CREATININE 0.54 0.60  CALCIUM 8.3* 8.6   Liver Function Tests:  Recent Labs Lab 09/28/12 2203 09/30/12 0550  AST 18 12  ALT 9 6  ALKPHOS 132* 103  BILITOT 0.2* 0.3  PROT 6.7 5.6*  ALBUMIN 3.3* 2.5*    Recent Labs Lab 09/28/12 2203  LIPASE 23   CBC:  Recent Labs Lab 09/30/12 0550 10/01/12 0415 10/02/12 0542  WBC 11.8* 7.1 5.4  NEUTROABS 8.7* 4.2   --   HGB 13.4 11.5* 13.1  HCT 40.0 34.8* 39.5  MCV 89.7 90.6 90.8  PLT 229 197 210   Urine Drug Screen: Drugs of Abuse     Component Value Date/Time   LABOPIA POS* 02/18/2010 2218   COCAINSCRNUR NEG 02/18/2010 2218   LABBENZ NEG 02/18/2010 2218   AMPHETMU NEG 02/18/2010 2218    Urinalysis:  Recent Labs Lab 09/29/12 0158  COLORURINE AMBER*  LABSPEC 1.023  PHURINE 5.0  GLUCOSEU NEGATIVE  HGBUR NEGATIVE  BILIRUBINUR SMALL*  KETONESUR NEGATIVE  PROTEINUR NEGATIVE  UROBILINOGEN 0.2  NITRITE NEGATIVE  LEUKOCYTESUR NEGATIVE   Micro Results: Recent Results (from the past 240 hour(s))  URINE CULTURE     Status: None   Collection Time    09/29/12  1:57 AM      Result Value Range Status   Specimen Description URINE, CLEAN CATCH   Final   Special Requests NONE   Final   Culture  Setup Time     Final   Value: 09/29/2012 10:24     Performed at [facility] Count     Final   Value: NO GROWTH     Performed at [facility]  Final   Value: NO GROWTH     Performed at [facility]   Report Status 09/30/2012 FINAL   Final  CULTURE, BLOOD (ROUTINE X 2)     Status: None   Collection Time    09/29/12  4:30 AM      Result Value Range Status   Specimen Description BLOOD LEFT ARM   Final   Special Requests BOTTLES DRAWN AEROBIC AND ANAEROBIC 5CC   Final   Culture  Setup Time     Final   Value: 09/29/2012 10:06     Performed at [facility]   Culture     Final   Value:        BLOOD CULTURE RECEIVED NO GROWTH TO DATE CULTURE WILL BE HELD FOR 5 DAYS BEFORE ISSUING A FINAL NEGATIVE REPORT     Performed at [facility]   Report Status PENDING   Incomplete  CULTURE, BLOOD (ROUTINE X 2)     Status: None   Collection Time    09/29/12  4:40 AM      Result Value Range Status   Specimen Description BLOOD LEFT HAND   Final   Special Requests BOTTLES DRAWN AEROBIC ONLY 10CC   Final   Culture  Setup Time     Final   Value:  09/29/2012 10:07     Performed at [facility]   Culture     Final   Value:        BLOOD CULTURE RECEIVED NO GROWTH TO DATE CULTURE WILL BE HELD FOR 5 DAYS BEFORE ISSUING A FINAL NEGATIVE REPORT     Performed at [facility]   Report Status PENDING   Incomplete  CLOSTRIDIUM DIFFICILE BY PCR     Status: None   Collection Time    09/29/12  8:46 AM      Result Value Range Status   C difficile by pcr NEGATIVE  NEGATIVE Final   Studies/Results: No results found. Medications: I have reviewed the patient's current medications. Scheduled Meds: . ampicillin-sulbactam (UNASYN) IV  3 g Intravenous Q6H  . buPROPion  100 mg Oral TID AC & HS  . heparin  5,000 Units Subcutaneous Q8H  . pantoprazole  80 mg Oral Q1200  . potassium chloride  40 mEq Oral BID   Continuous Infusions:   PRN Meds:.morphine injection, promethazine  Assessment/Plan:  # Acute Colitis  The patients acute colitis is concerning for infectious vs ischemic etiologies. Infection is the most likely cause as the patient was admitted with leukocytosis (16 with left shift) and tachycardia. WBC count has been downtrending, now at 7.1. Pt had been tachycardic, but this normalized overnight. She has remained afebrile throughout her hospitalization. As the patient has a history of ischemic colitis and had an elevated lactic acid at 2.4 upon admission, which resolved to 1.0 after IVF and abx, which makes ischemia unlikely. Blood Cx x2 and urine cx - NGTD. Stool pathogen panel- all negative so far. C diff PCR negative. She was started on IV Unasyn on admission with aggressive Pain Control (patient on chronic opiate therapy with 15 mg MS Contin BID and Percocet qid prn), and GenSurg was consulted but did not feel the pt required surgical intervention. Her dies has been slowly advanced back to her home low-fiber diet, which has been well tolerated. Her pain has greatly improved, and she is ready for d/c to home today.  - Continue  abx, changing to Cipro/Flagyl at discharge x10 more days. -  BCx pending - Continue pain ctrl with home Vicodin  # Tachycardia Resolved. The patient's tachycardia was originally thought to be 2/2 hypovolemia, however after receiving 1L NS and NS at 100cc/hr for two days, she was still tachycardic. This is likely SIRS response to infection/inflammation, and after three days of IV abx, it resolved. BP has remained stable throughout.    # N/V  Resolved. - Phenergan prn  - Continue low-fiber diet - home dose of ppi (protonix formulary substitute for nexium)  # Depression Stable.  - Continue home Wellbutrin  Prophylaxis: Heparin sq, SCDs    Dispo: D/c to home today.  The patient does have a current PCP Linward Headland, MD) and does not need an [facility] hospital follow-up appointment after discharge.  The patient does not have transportation limitations that hinder transportation to clinic appointments.  .Services Needed at time of discharge: Y = Yes, Blank = No PT:   OT:   RN:   Equipment:   Other:     LOS: 4 days   Genelle Gather, MD 10/02/2012, 12:10 PM

## 2012-10-03 NOTE — Discharge Summary (Signed)
  Date: 10/03/2012  Patient name: [name] Bell  Medical record number: [id number]  Date of birth: May 22, 1945   This patient has been discussed with the house staff. Please see their note for complete details. I concur with their findings and plan.  Jonah Blue, DO, FACP Faculty [facility] 10/03/2012, 7:46 AM

## 2012-10-05 LAB — CULTURE, BLOOD (ROUTINE X 2)

## 2012-10-11 ENCOUNTER — Ambulatory Visit (INDEPENDENT_AMBULATORY_CARE_PROVIDER_SITE_OTHER): Payer: Medicare Other | Admitting: Internal Medicine

## 2012-10-11 ENCOUNTER — Encounter: Payer: Self-pay | Admitting: Internal Medicine

## 2012-10-11 VITALS — BP 117/70 | HR 110 | Temp 97.6°F | Wt 153.3 lb

## 2012-10-11 DIAGNOSIS — E785 Hyperlipidemia, unspecified: Secondary | ICD-10-CM

## 2012-10-11 DIAGNOSIS — K5289 Other specified noninfective gastroenteritis and colitis: Secondary | ICD-10-CM

## 2012-10-11 DIAGNOSIS — K529 Noninfective gastroenteritis and colitis, unspecified: Secondary | ICD-10-CM

## 2012-10-11 DIAGNOSIS — E876 Hypokalemia: Secondary | ICD-10-CM | POA: Diagnosis not present

## 2012-10-11 LAB — BASIC METABOLIC PANEL WITH GFR
Calcium: 9.6 mg/dL (ref 8.4–10.5)
Creat: 0.79 mg/dL (ref 0.50–1.10)
GFR, Est African American: 88 mL/min
GFR, Est Non African American: 77 mL/min
Sodium: 136 mEq/L (ref 135–145)

## 2012-10-11 MED ORDER — PROMETHAZINE HCL 12.5 MG PO TABS: 12.5000 mg | ORAL_TABLET | Freq: Four times a day (QID) | ORAL | Status: DC | PRN

## 2012-10-11 NOTE — Patient Instructions (Addendum)
1. Please keep your follow-up appointment with Dr. Michaell Cowing in 2 weeks.     2. Please take all medications as prescribed.    3. If you have worsening of your symptoms or new symptoms arise, please call the clinic ([phone]), or go to the ER immediately if symptoms are severe.

## 2012-10-11 NOTE — Assessment & Plan Note (Addendum)
Assessment:  Patient with improving LLQ pain.  She denies fever or blood in stool and is tolerating a bland diet.  She says she is schedule to see surgeon, Dr. Karie Soda on 10/26/12 regarding peristomal hernia.  Plan:   1) Follow-up with Dr. Michaell Cowing on 10/26/12  2) Go to the emergency room with severe symptoms such as fever, increasing abdominal pain, blood in stool.  3) promethazine refilled for nausea

## 2012-10-11 NOTE — Progress Notes (Signed)
  Subjective:    Patient ID: [name], female    DOB: 05-24-1945, 67 y.o.   MRN: [id number]  HPI Comments: Ms. Sinkler is a 67 year old woman with a PMH of COPD and diverticulitis of colon with perforation (s/p colostomy 07/2011).  She presents for hospital follow-up after 09/03-09/07/14 admission for infectious colitis.  She says she is improved since discharge but she is still experiencing some LLQ pain 5-6/10 and nausea.  She has not vomited in the past week.  She was discharged on ciprofloxacin and Flagyl and has noticed increase loose stool in her colostomy bag.  She is now needing to empty the bag 3-4 times daily.  She has noticed no blood in the stool, denies fever and is tolerating her diet.  She has two days left of her antibiotic course.     Review of Systems  Respiratory: Negative for shortness of breath.   Cardiovascular: Negative for chest pain.  Gastrointestinal: Negative for blood in stool.       Objective:   Physical Exam  Constitutional: She is oriented to person, place, and time. No distress.  Cardiovascular: Normal rate, regular rhythm and normal heart sounds.   Pulmonary/Chest: Effort normal and breath sounds normal. No respiratory distress. She has no wheezes. She has no rales.  Abdominal: Soft. She exhibits no distension. There is tenderness. There is no guarding.  Peristomal hernia  Neurological: She is alert and oriented to person, place, and time.  Skin: Skin is warm. She is not diaphoretic.  Psychiatric: She has a normal mood and affect. Her behavior is normal.          Assessment & Plan:  Please see problem based assessment and plan.

## 2012-10-12 ENCOUNTER — Other Ambulatory Visit: Payer: Self-pay | Admitting: Internal Medicine

## 2012-10-12 DIAGNOSIS — E785 Hyperlipidemia, unspecified: Secondary | ICD-10-CM

## 2012-10-12 LAB — LIPID PANEL
Cholesterol: 233 mg/dL — ABNORMAL HIGH (ref 0–200)
Triglycerides: 188 mg/dL — ABNORMAL HIGH (ref <150)

## 2012-10-12 NOTE — Progress Notes (Signed)
INTERNAL MEDICINE TEACHING ATTENDING ADDENDUM - Jonah Blue, DO, FACP: I personally saw and evaluated [name] Bell, [name] Bell in this clinic visit in conjunction with the resident, Evelena Peat, DO. I have discussed patient's plan of care with medical resident during this visit. I have confirmed the physical exam findings and have read and agree with the clinic note including the plan.

## 2012-10-12 NOTE — Addendum Note (Signed)
Addended by: Bufford Spikes on: 10/12/2012 11:02 AM   Modules accepted: Orders

## 2012-10-25 ENCOUNTER — Encounter (INDEPENDENT_AMBULATORY_CARE_PROVIDER_SITE_OTHER): Payer: Self-pay

## 2012-10-31 ENCOUNTER — Encounter (INDEPENDENT_AMBULATORY_CARE_PROVIDER_SITE_OTHER): Payer: Self-pay | Admitting: Surgery

## 2012-10-31 ENCOUNTER — Ambulatory Visit (INDEPENDENT_AMBULATORY_CARE_PROVIDER_SITE_OTHER): Payer: Medicare Other | Admitting: Surgery

## 2012-10-31 VITALS — BP 126/76 | HR 92 | Temp 97.9°F | Resp 15 | Ht 64.0 in | Wt 144.6 lb

## 2012-10-31 DIAGNOSIS — Z933 Colostomy status: Secondary | ICD-10-CM

## 2012-10-31 DIAGNOSIS — K5900 Constipation, unspecified: Secondary | ICD-10-CM | POA: Diagnosis not present

## 2012-10-31 DIAGNOSIS — K5289 Other specified noninfective gastroenteritis and colitis: Secondary | ICD-10-CM | POA: Diagnosis not present

## 2012-10-31 DIAGNOSIS — K529 Noninfective gastroenteritis and colitis, unspecified: Secondary | ICD-10-CM

## 2012-10-31 NOTE — Progress Notes (Signed)
Subjective:     Patient ID: [name] Bell, female   DOB: 04/25/1945, 67 y.o.   MRN: [id number]  HPI  [name] Bell  1945/06/06 [id number]  Patient Care Team: Linward Headland, MD as PCP - General (Internal Medicine) Petra Kuba, MD as Consulting Physician (Gastroenterology)  This patient is a 67 y.o.female who presents today for surgical evaluation.   Reason for visit: Followup from admission to hospital for colitis.  The patient returns today feeling better.  Patient with severe defecation and constipation problems.  Developed a perforation of colon that required laparoscopically assisted Hartmann procedure last year.  She prefers having the colostomy and avoiding straining on the commode for several hours at a time.  She was admitted last month with abdominal pain and found to have a thickened colon suspicious for colitis.  She did not fully comprehend what happened in the hospital.  I went over this in detail.  Head colonic thickening suspicious for colitis.  Improved with bowel rest antibiotics.  She is feeling stronger.  She is eating better.  No fevers or chills.  She has been staying on a bland diet.  Energy level otherwise pretty good.  Remains on narcotics for her chronic pain issues but no major alterations.  Trying to get back on the MiraLAX.   Patient Active Problem List   Diagnosis Date Noted  . Diverticulitis of colon with perforation 07/30/2011    Priority: Medium  . Acute colitis 09/29/2012  . Macular degeneration 01/14/2012  . Preventative health care 01/14/2012  . COPD (chronic obstructive pulmonary disease) 11/25/2011  . Colostomy in place 10/27/2011  . Physical deconditioning 08/26/2011  . Constipation 07/17/2010  . HYPERLIPIDEMIA 01/24/2009  . Chronic pain syndrome 01/03/2009  . DEPRESSION 03/08/2008  . TEMPOROMANDIBULAR JOINT PAIN 12/01/2007  . OSTEOPOROSIS 03/03/2007  . ACUTE DUODENAL ULCER W/HEMORRHAGE&OBSTRUCTION 12/28/2006  . TOBACCO ABUSE 12/02/2006  .  VITAMIN B12 DEFICIENCY 11/03/2006    Past Medical History  Diagnosis Date  . Superior mesenteric artery syndrome 11/08    sp dilation by Dr. Ewing Schlein, Pt may require bowel resetion if sx recur  . Duodenal ulcer 11/08    With hemorrhage and obstruction  . Osteomyelitis, jaw acute 11/08    Started while in the hospital abcess showed GNR . SP debridement by Dr. Neoma Laming,  Ernesta Amble S Bovis sp 4  weeks of Pen V started on 05/19/07  with additional 2 weeks in 07/04/07  . Degenerative disk disease     This is interscapular, mild compression frx of T12 superior endplate with Schmorl's node. Seen on CT in 2/08  . Back pain   . Depression   . Acute sinusitis 06/30/2011  . Ischemic colitis 07/30/2011    2009 by endoscopy   . Perforated bowel     surgery july 2013    Past Surgical History  Procedure Laterality Date  . Abdominal hysterectomy    . Colostomy  07/30/2011    Procedure: COLOSTOMY;  Surgeon: Ardeth Sportsman, MD;  Location: [facility] OR;  Service: General;  Laterality: Left;  . Lysis of adhesions  07/30/2011  . Sigmoid colectomy  07/30/2011  . Transrectal drainage of pelvic abscess  07/30/2011    History   Social History  . Marital Status: Divorced    Spouse Name: N/A    Number of Children: N/A  . Years of Education: N/A   Occupational History  . Not on file.   Social History Main Topics  . Smoking status: Former Smoker --  0.20 packs/day for 20 years    Types: Cigarettes    Quit date: 05/26/2012  . Smokeless tobacco: Not on file     Comment: 2 cigs/day  . Alcohol Use: No  . Drug Use: No  . Sexual Activity: Not on file   Other Topics Concern  . Not on file   Social History Narrative   Pt now lives by herself in an ALF [facility]) where she can come and go as she pleases.  A good friend named [name] still looks out for her daily.     Family History  Problem Relation Age of Onset  . Cancer Mother   . Heart disease Father   . Diabetes Sister     Current Outpatient Prescriptions    Medication Sig Dispense Refill  . buPROPion (WELLBUTRIN) 100 MG tablet Take 100 mg by mouth 4 (four) times daily.      . calcium-vitamin D (OSCAL WITH D 500-200) 500-200 MG-UNIT per tablet Take 1 tablet by mouth 3 (three) times daily.        . cholecalciferol (VITAMIN D) 1000 UNITS tablet Take 1,000 Units by mouth 3 (three) times daily.      Marland Kitchen docusate sodium (COLACE) 100 MG capsule Take 100 mg by mouth daily as needed for constipation.       Marland Kitchen esomeprazole (NEXIUM) 40 MG capsule Take 1 capsule (40 mg total) by mouth 2 (two) times daily.  180 capsule  3  . HYDROcodone-acetaminophen (NORCO) 7.5-325 MG per tablet Take 1 tablet by mouth every 6 (six) hours as needed for pain.  150 tablet  5  . methocarbamol (ROBAXIN) 750 MG tablet Take 1 tablet (750 mg total) by mouth 4 (four) times daily as needed.  120 tablet  3  . morphine (MS CONTIN) 15 MG 12 hr tablet Take 1 tablet (15 mg total) by mouth every 12 (twelve) hours.  60 tablet  0  . pravastatin (PRAVACHOL) 80 MG tablet Take 1 tablet (80 mg total) by mouth daily.  30 tablet  11  . promethazine (PHENERGAN) 12.5 MG tablet Take 1 tablet (12.5 mg total) by mouth every 6 (six) hours as needed.  90 tablet  0   No current facility-administered medications for this visit.     Allergies  Allergen Reactions  . Ibuprofen     REACTION: Bleeding ulcers  . Latex Itching  . Nsaids     REACTION: Bleeding Ulcer    BP 126/76  Pulse 92  Temp(Src) 97.9 F (36.6 C) (Temporal)  Resp 15  Ht 5\' 4"  (1.626 m)  Wt 144 lb 9.6 oz (65.59 kg)  BMI 24.81 kg/m2  LMP 08/25/1980  No results found.   Review of Systems  Constitutional: Negative for fever, chills, diaphoresis, appetite change and fatigue.  HENT: Negative for ear pain, sore throat, trouble swallowing, neck pain and ear discharge.   Eyes: Negative for photophobia, discharge and visual disturbance.  Respiratory: Negative for cough, choking, chest tightness and shortness of breath.   Cardiovascular:  Negative for chest pain and palpitations.  Gastrointestinal: Positive for constipation. Negative for nausea, vomiting, diarrhea, abdominal distention, anal bleeding and rectal pain.  Endocrine: Negative for cold intolerance and heat intolerance.  Genitourinary: Negative for dysuria, frequency and difficulty urinating.  Musculoskeletal: Negative for myalgias and gait problem.  Skin: Negative for color change, pallor and rash.  Allergic/Immunologic: Negative for environmental allergies, food allergies and immunocompromised state.  Neurological: Negative for dizziness, speech difficulty, weakness and numbness.  Hematological: Negative  for adenopathy.  Psychiatric/Behavioral: Negative for confusion and agitation. The patient is not nervous/anxious.        Objective:   Physical Exam  Constitutional: She is oriented to person, place, and time. She appears well-developed and well-nourished. No distress.  HENT:  Head: Normocephalic.  Mouth/Throat: Oropharynx is clear and moist. No oropharyngeal exudate.  Eyes: Conjunctivae and EOM are normal. Pupils are equal, round, and reactive to light. No scleral icterus.  Neck: Normal range of motion. Neck supple. No tracheal deviation present.  Cardiovascular: Normal rate, regular rhythm and intact distal pulses.   Pulmonary/Chest: Effort normal and breath sounds normal. No stridor. No respiratory distress. She exhibits no tenderness.  Abdominal: Soft. She exhibits no distension and no mass. There is no tenderness. Hernia confirmed negative in the right inguinal area and confirmed negative in the left inguinal area.    Genitourinary: No vaginal discharge found.  Musculoskeletal: Normal range of motion. She exhibits no tenderness.       Right elbow: She exhibits normal range of motion.       Left elbow: She exhibits normal range of motion.       Right wrist: She exhibits normal range of motion.       Left wrist: She exhibits normal range of motion.        Right hand: Normal strength noted.       Left hand: Normal strength noted.  Lymphadenopathy:       Head (right side): No posterior auricular adenopathy present.       Head (left side): No posterior auricular adenopathy present.    She has no cervical adenopathy.    She has no axillary adenopathy.       Right: No inguinal adenopathy present.       Left: No inguinal adenopathy present.  Neurological: She is alert and oriented to person, place, and time. No cranial nerve deficit. She exhibits normal muscle tone. Coordination normal.  Skin: Skin is warm and dry. No rash noted. She is not diaphoretic. No erythema.  Psychiatric: Her speech is normal and behavior is normal. Judgment and thought content normal. Her affect is not angry. She is not agitated. She does not exhibit a depressed mood.  Mildly odd affect.  Tends to avoid eye contact. Pleasant        Assessment:     Recent episode of colitis improved with bowel rest and IV antibiotics.  Stable off antibiotics.    Plan:     Recent episode of colitis improved with bowel rest and IV antibiotics.  Stable off antibiotics.  Hold off on any further intervention aside from a good fiber-based bowel regimen.  Consider repeat colonoscopy if recurrent problems  Mild parastomal hernia at best.  Not causing obstruction.  Hold off on any surgery.  Patient prefers colostomy to severe defecation difficulty.  I will not interfere with this.  Increase activity as tolerated to regular activity.  Low impact exercise such as walking an hour a day at least ideal.  Do not push through pain.  Diet as tolerated.  Low fat high fiber diet ideal.  Bowel regimen with 30 g fiber a day and fiber supplement as needed to avoid problems.  Increase bowel regimen with chronic constipation.  Consider weaning off narcotics as they are a contributor for further bowel problems..   Return to clinic as needed.   Instructions discussed.  Followup with primary care physician for  other health issues as would normally be done.  Questions  answered.  The patient expressed understanding and appreciation

## 2012-10-31 NOTE — Patient Instructions (Addendum)
You had an episode of colitis.  This improved with IV fluids and antibiotics while in the hospital.  It is okay to transition back to a low fat, high fiber diet.    It is a good idea to wean yourself off narcotics as possible.  If you have worsening pain, he may benefit from repeat studies including possible colonoscopy.  Because you are better, we will hold off for now  Colitis Colitis is inflammation of the colon. Colitis can be a short-term or long-standing (chronic) illness. Crohn's disease and ulcerative colitis are 2 types of colitis which are chronic. They usually require lifelong treatment. CAUSES  There are many different causes of colitis, including:  Viruses.  Germs (bacteria).  Medicine reactions. SYMPTOMS   Diarrhea.  Intestinal bleeding.  Pain.  Fever.  Throwing up (vomiting).  Tiredness (fatigue).  Weight loss.  Bowel blockage. DIAGNOSIS  The diagnosis of colitis is based on examination and stool or blood tests. X-rays, CT scan, and colonoscopy may also be needed. TREATMENT  Treatment may include:  Fluids given through the vein (intravenously).  Bowel rest (nothing to eat or drink for a period of time).  Medicine for pain and diarrhea.  Medicines (antibiotics) that kill germs.  Cortisone medicines.  Surgery. HOME CARE INSTRUCTIONS   Get plenty of rest.  Drink enough water and fluids to keep your urine clear or pale yellow.  Eat a well-balanced diet.  Call your caregiver for follow-up as recommended. SEEK IMMEDIATE MEDICAL CARE IF:   You develop chills.  You have an oral temperature above 102 F (38.9 C), not controlled by medicine.  You have extreme weakness, fainting, or dehydration.  You have repeated vomiting.  You develop severe belly (abdominal) pain or are passing bloody or tarry stools. MAKE SURE YOU:   Understand these instructions.  Will watch your condition.  Will get help right away if you are not doing well or get  worse. Document Released: 02/20/2004 Document Revised: 04/06/2011 Document Reviewed: 05/17/2009 [facility] Patient Information 2014 [place], [place].  GETTING TO GOOD BOWEL HEALTH. Irregular bowel habits such as constipation and diarrhea can lead to many problems over time.  Having one soft bowel movement a day is the most important way to prevent further problems.  The anorectal canal is designed to handle stretching and feces to safely manage our ability to get rid of solid waste (feces, poop, stool) out of our body.  BUT, hard constipated stools can act like ripping concrete bricks and diarrhea can be a burning fire to this very sensitive area of our body, causing inflamed hemorrhoids, anal fissures, increasing risk is perirectal abscesses, abdominal pain/bloating, an making irritable bowel worse.     The goal: ONE SOFT BOWEL MOVEMENT A DAY!  To have soft, regular bowel movements:    Drink at least 8 tall glasses of water a day.     Take plenty of fiber.  Fiber is the undigested part of plant food that passes into the colon, acting s "natures broom" to encourage bowel motility and movement.  Fiber can absorb and hold large amounts of water. This results in a larger, bulkier stool, which is soft and easier to pass. Work gradually over several weeks up to 6 servings a day of fiber (25g a day even more if needed) in the form of: o Vegetables -- Root (potatoes, carrots, turnips), leafy green (lettuce, salad greens, celery, spinach), or cooked high residue (cabbage, broccoli, etc) o Fruit -- Fresh (unpeeled skin & pulp), Dried (  prunes, apricots, cherries, etc ),  or stewed ( applesauce)  o Whole grain breads, pasta, etc (whole wheat)  o Bran cereals    Bulking Agents -- This type of water-retaining fiber generally is easily obtained each day by one of the following:  o Psyllium bran -- The psyllium plant is remarkable because its ground seeds can retain so much water. This product is available as Metamucil,  Konsyl, Effersyllium, Per Diem Fiber, or the less expensive generic preparation in drug and health food stores. Although labeled a laxative, it really is not a laxative.  o Methylcellulose -- This is another fiber derived from wood which also retains water. It is available as Citrucel. o Polyethylene Glycol - and "artificial" fiber commonly called Miralax or Glycolax.  It is helpful for people with gassy or bloated feelings with regular fiber o Flax Seed - a less gassy fiber than psyllium   No reading or other relaxing activity while on the toilet. If bowel movements take longer than 5 minutes, you are too constipated   AVOID CONSTIPATION.  High fiber and water intake usually takes care of this.  Sometimes a laxative is needed to stimulate more frequent bowel movements, but    Laxatives are not a good long-term solution as it can wear the colon out. o Osmotics (Milk of Magnesia, Fleets phosphosoda, Magnesium citrate, MiraLax, GoLytely) are safer than  o Stimulants (Senokot, Castor Oil, Dulcolax, Ex Lax)    o Do not take laxatives for more than 7days in a row.    IF SEVERELY CONSTIPATED, try a Bowel Retraining Program: o Do not use laxatives.  o Eat a diet high in roughage, such as bran cereals and leafy vegetables.  o Drink six (6) ounces of prune or apricot juice each morning.  o Eat two (2) large servings of stewed fruit each day.  o Take one (1) heaping tablespoon of a psyllium-based bulking agent twice a day. Use sugar-free sweetener when possible to avoid excessive calories.  o Eat a normal breakfast.  o Set aside 15 minutes after breakfast to sit on the toilet, but do not strain to have a bowel movement.  o If you do not have a bowel movement by the third day, use an enema and repeat the above steps.    Controlling diarrhea o Switch to liquids and simpler foods for a few days to avoid stressing your intestines further. o Avoid dairy products (especially milk & ice cream) for a short time.   The intestines often can lose the ability to digest lactose when stressed. o Avoid foods that cause gassiness or bloating.  Typical foods include beans and other legumes, cabbage, broccoli, and dairy foods.  Every person has some sensitivity to other foods, so listen to our body and avoid those foods that trigger problems for you. o Adding fiber (Citrucel, Metamucil, psyllium, Miralax) gradually can help thicken stools by absorbing excess fluid and retrain the intestines to act more normally.  Slowly increase the dose over a few weeks.  Too much fiber too soon can backfire and cause cramping & bloating. o Probiotics (such as active yogurt, Align, etc) may help repopulate the intestines and colon with normal bacteria and calm down a sensitive digestive tract.  Most studies show it to be of mild help, though, and such products can be costly. o Medicines:   Bismuth subsalicylate (ex. Kayopectate, Pepto Bismol) every 30 minutes for up to 6 doses can help control diarrhea.  Avoid if pregnant.  Loperamide (Immodium) can slow down diarrhea.  Start with two tablets (4mg  total) first and then try one tablet every 6 hours.  Avoid if you are having fevers or severe pain.  If you are not better or start feeling worse, stop all medicines and call your doctor for advice o Call your doctor if you are getting worse or not better.  Sometimes further testing (cultures, endoscopy, X-ray studies, bloodwork, etc) may be needed to help diagnose and treat the cause of the diarrhea.   Ostomy Support Information  Yes, you've heard that people get along just fine with only one of their eyes, or one of their lungs, or one of their kidneys. But you also know that you have only one intestine and only one bladder, and that leaves you feeling awfully empty, both physically and emotionally: You think no other people go around without part of their intestine with the ends of their intestines sticking out through their abdominal walls.   Well, you are wrong! There are nearly three quarters of a million people in the [place] who have an ostomy; people who have had surgery to remove all or part of their colons or bladders. There is even a national association, the [place] [employer] of [place] with over 350 local affiliated support groups that are organized by volunteers who provide peer support and counseling. Cheral Marker has a toll free telephone num-ber, 807-293-0684 and an educational,  interactive website, www.ostomy.org   An ostomy is an opening in the belly (abdominal wall) made by surgery. Ostomates are people who have had this procedure. The opening (stoma) allows the kidney or bowel to discharge waste. An external pouch covers the stoma to collect waste. Pouches are are a simple bag and are odor free. Different companies have disposable or reusable pouches to fit one's lifestyle. An ostomy can either be temporary or permanent.  THERE ARE THREE MAIN TYPES OF OSTOMIES  Colostomy. A colostomy is a surgically created opening in the large intestine (colon).  Ileostomy. An ileostomy is a surgically created opening in the small intestine.  Urostomy. A urostomy is a surgically created opening to divert urine away from the bladder. FREQUENTLY ASKED QUESTIONS   Why haven't you met any of these folks who have an ostomy?  Well, maybe you have! You just did not recognize them because an ostomy doesn't show. It can be kept secret if you wish. Why, maybe some of your best friends, office associates or neighbors have an ostomy ... you never can tell.   People facing ostomy surgery have many quality-of-life questions like:  Will you bulge? Smell? Make noises? Will you feel waste leaving your body? Will you be a captive of the toilet? Will you starve? Be a social outcast? Get/stay married? Have babies? Easily bathe, go swimming, bend over?  OK, let's look at what you can expect:  Will you bulge?  Remember, without part of the intestine or  bladder, and its contents, you should have a flatter tummy than before. You can expect to wear, with little exception, what you wore before surgery ... and this in-cludes tight clothing and bathing suits.  Will you smell?  Today, thanks to modern odor proof pouching systems, you can walk into an ostomy support group meeting and not smell anything that is foul or offensive. And, for those with an ileostomy or colostomy who are concerned about odor when emptying their pouch, there are in-pouch deodorants that can be used to eliminate any waste odors that may  exist.  Will you make noises?  Everyone produces gas, especially if they are an air-swallower. But intestinal sounds that occur from time to time are no differ-ent than a gurgling tummy, and quite often your clothing will muffle any sounds.   Will you feel the waste discharges?  For those with a colostomy or ileostomy there might be a slight pressure when waste leaves your body, but understand that the intestines have no nerve endings, so there will be no unpleasant sensations. Those with a urostomy will probably be unaware of any kidney drainage.  Will you be a captive of the toilet?  Immediately post-op you will spend more time in the bathroom than you will after your body recovers from surgery. Every person is different, but on average those with an ileostomy or urostomy may empty their pouches 4 to 6 times a day; a little  less if you have a colostomy. The average wear time between pouch system changes is 3 to 5 days and the changing process should take less than 30 minutes.  Will I need to be on a special diet? Most people return to their normal diet when they have recovered from surgery. Be sure to chew your food well, eat a well-balanced diet and drink plenty of fluids. If you experience problems with a certain food, wait a couple of weeks and try it again. Will there be odor and noises? Pouching systems are designed to be odor-proof or  odor-resistant. There are deodorants that can be used in the pouch. Medications are also available to help reduce odor. Limit gas-producing foods and carbonated beverages. You will experience less gas and fewer noises as you heal from surgery. How much time will it take to care for my ostomy? At first, you may spend a lot of time learning about your ostomy and how to take care of it. As you become more comfortable and skilled at changing the pouching system, it will take very little time to care for it.  Will I be able to return to work? People with ostomies can perform most jobs. As soon as you have healed from surgery, you should be able to return to work. Heavy lifting (more than 10 pounds) may be discouraged.  What about intimacy? Sexual relationships and intimacy are important and fulfilling aspects of your life. They should continue after ostomy surgery. Intimacy-related concerns should be discussed openly between you and your partner.  Can I wear regular clothing? You do not need to wear special clothing. Ostomy pouches are fairly flat and barely noticeable. Elastic undergarments will not hurt the stoma or prevent the ostomy from functioning.  Can I participate in sports? An ostomy should not limit your involvement in sports. Many people with ostomies are runners, skiers, swimmers or participate in other active lifestyles. Talk with your caregiver first before doing heavy physical activity.  Will you starve?  Not if you follow doctor's orders at each stage of your post-op adjustment. There is no such thing as an "ostomy diet". Some people with an ostomy will be able to eat and tolerate anything; others may find diffi-culty with some foods. Each person is an individual and must determine, by trial, what is best for them. A good practice for all is to drink plenty of water.  Will you be a social outcast?  Have you met anyone who has an ostomy and is a social outcast? Why should you be the first?  Only your attitude and self image will effect how  you are treated. No confi-dent person is an Investment banker, corporate.   PROFESSIONAL HELP  Resources are available if you need help or have questions about your ostomy.    Specially trained nurses called Wound, Ostomy Continence Nurses (WOCN) are available for consultation in most major medical centers.   Consider getting an ostomy consult with Page Spiro at [facility] to help troubleshoot collagen fittings and other issues with your ostomy: [phone]   The United Ostomy Association (UOA) is a group made up of many local chapters throughout the [place]. These local groups hold meetings and provide support to prospective and existing ostomates. They sponsor educational events and have qualified visitors to make personal or telephone visits. Contact the UOA for the chapter nearest you and for other educational publications.  More detailed information can be found in Colostomy Guide, a publication of the [employer] (UOA). Contact UOA at 1-905-708-7108 or visit their web site at YellowSpecialist.at. The website contains links to other sites, suppliers and resources. Document Released: 01/15/2003 Document Revised: 04/06/2011 Document Reviewed: 05/16/2008 [facility] Patient Information 2013 [place], [place].

## 2012-11-01 ENCOUNTER — Other Ambulatory Visit: Payer: Self-pay | Admitting: *Deleted

## 2012-11-01 MED ORDER — MORPHINE SULFATE ER 15 MG PO TBCR: 15.0000 mg | EXTENDED_RELEASE_TABLET | Freq: Two times a day (BID) | ORAL | Status: DC

## 2012-11-21 ENCOUNTER — Other Ambulatory Visit: Payer: Self-pay | Admitting: *Deleted

## 2012-11-21 NOTE — Telephone Encounter (Signed)
Call from pt - need new hard copy rx. Thanks

## 2012-11-22 ENCOUNTER — Other Ambulatory Visit: Payer: Self-pay | Admitting: Internal Medicine

## 2012-11-22 MED ORDER — HYDROCODONE-ACETAMINOPHEN 7.5-325 MG PO TABS: 1.0000 | ORAL_TABLET | Freq: Four times a day (QID) | ORAL | Status: DC | PRN

## 2012-11-22 MED ORDER — MORPHINE SULFATE ER 15 MG PO TBCR: 15.0000 mg | EXTENDED_RELEASE_TABLET | Freq: Two times a day (BID) | ORAL | Status: DC

## 2012-11-25 ENCOUNTER — Other Ambulatory Visit: Payer: Self-pay | Admitting: Internal Medicine

## 2012-12-05 ENCOUNTER — Ambulatory Visit: Payer: Medicare Other | Admitting: Internal Medicine

## 2012-12-05 ENCOUNTER — Telehealth: Payer: Self-pay | Admitting: *Deleted

## 2012-12-05 ENCOUNTER — Ambulatory Visit (INDEPENDENT_AMBULATORY_CARE_PROVIDER_SITE_OTHER): Payer: Medicare Other | Admitting: Internal Medicine

## 2012-12-05 ENCOUNTER — Encounter: Payer: Self-pay | Admitting: Internal Medicine

## 2012-12-05 VITALS — BP 100/62 | HR 107 | Temp 98.8°F | Wt 140.4 lb

## 2012-12-05 DIAGNOSIS — H3530 Unspecified macular degeneration: Secondary | ICD-10-CM

## 2012-12-05 DIAGNOSIS — H65199 Other acute nonsuppurative otitis media, unspecified ear: Secondary | ICD-10-CM

## 2012-12-05 DIAGNOSIS — H6693 Otitis media, unspecified, bilateral: Secondary | ICD-10-CM

## 2012-12-05 DIAGNOSIS — H65193 Other acute nonsuppurative otitis media, bilateral: Secondary | ICD-10-CM | POA: Insufficient documentation

## 2012-12-05 DIAGNOSIS — Z433 Encounter for attention to colostomy: Secondary | ICD-10-CM

## 2012-12-05 DIAGNOSIS — Z933 Colostomy status: Secondary | ICD-10-CM

## 2012-12-05 DIAGNOSIS — H6690 Otitis media, unspecified, unspecified ear: Secondary | ICD-10-CM | POA: Diagnosis not present

## 2012-12-05 MED ORDER — BENZOCAINE 20 % OT SOLN: 1.0000 [drp] | Freq: Four times a day (QID) | OTIC | Status: DC | PRN

## 2012-12-05 MED ORDER — AMOXICILLIN 500 MG PO TABS: 500.0000 mg | ORAL_TABLET | Freq: Two times a day (BID) | ORAL | Status: DC

## 2012-12-05 NOTE — Telephone Encounter (Signed)
Pt called since 12/02/12 problems with sinus and ears. Problems hearing. Appt made 12/05/12 3:45PM Dr Garald Braver. [name] Kidney Malacki Mcphearson RN 12/05/12 12N

## 2012-12-05 NOTE — Patient Instructions (Addendum)
-  Take amoxicillin 500mg  every 12 hours for 5 days for your ear ache.  -You may use the Oticaine drops in each ear to provide immediate relief of your ear ache.

## 2012-12-06 ENCOUNTER — Telehealth: Payer: Self-pay | Admitting: Licensed Clinical Social Worker

## 2012-12-06 NOTE — Telephone Encounter (Signed)
Ms. Scheuermann referred for [facility] RN services.  CSW will obtain pt's agency of choice.

## 2012-12-06 NOTE — Assessment & Plan Note (Addendum)
Due to her decline in vision presumptively secondary to progression of her macular degeneration, she has struggled to manage her colostomy. She asks for Home Health services as she lives alone and has no family or friends that can help her.   Referred to [facility] services, referred to CSW as well.

## 2012-12-06 NOTE — Progress Notes (Signed)
Case discussed with Dr. Kennerly soon after the resident saw the patient.  We reviewed the resident's history and exam and pertinent patient test results.  I agree with the assessment, diagnosis, and plan of care documented in the resident's note. 

## 2012-12-06 NOTE — Telephone Encounter (Signed)
[name] Bell was referred to CSW for request for PCS.  However, pt's current insurance does not cover PCS.  CSW placed call to [name] to provide additional resources.  CSW placed called to pt.  CSW left message requesting return call. CSW provided contact hours and phone number.

## 2012-12-06 NOTE — Assessment & Plan Note (Addendum)
Her decreased hearing and physical examination are suggestive of middle ear fluid collection. She has no bulging of the TM but erythema with pain. She was counseled on the possible side effect of antibiotic as far as changes in her stool consistency and she is aware that she may need to change her colostomy bag more frequently.   Rx: amoxicillin 500mg  q 12 hours for 5 days        Benzocaine drops, 1 drop in each ear q4hr PRN for ear pain      Pt advised to take Tylenol 325mg  to 500mg  q8hr PRN for pain and continue taking Robitussin PRN for cough.

## 2012-12-06 NOTE — Progress Notes (Signed)
  Subjective:    Patient ID: [name] Bell, female    DOB: [birth year], 67 y.o.   MRN: [id number]  Otalgia  Associated symptoms include coughing, hearing loss and rhinorrhea. Pertinent negatives include no abdominal pain, headaches, sore throat or vomiting.  Sinus Problem Associated symptoms include coughing and ear pain. Pertinent negatives include no chills, diaphoresis, headaches, shortness of breath, sinus pressure, sneezing or sore throat.   Ms. Pfannenstiel is a 67 year old woman with PMH significant for macular degeneration with decline in her vision, and post colostomy placement who presents for evaluation of ear ache with decreased hearing since Friday last week. She states that symptoms started suddenly and have progressively worsened over the weekend. She also has had a runny nose, throat scratching but no sore throat, and intermittent cough with clear sputum, but denies fever/chills, body aches, or nausea/vomiting. Her cough responds to Robitussin DM which she has been taking since Friday. She notes that her cough is improving.   Her colostomy has normal quantity of output but she explains that she cannot see well and is not aware of any stool changes. She explains that it has been very difficult for her to perform colostomy changes and her vision has significantly declined since January this year when her macular degeneration progressed. She lives alone and has no help at home. She feels anxious and is worried about caring for herself at home with her decreased vision and now decreased hearing secondary to her bilateral earaches.    Review of Systems  Constitutional: Negative for fever, chills, diaphoresis, activity change, appetite change and fatigue.  HENT: Positive for ear pain, hearing loss and rhinorrhea. Negative for sinus pressure, sneezing, sore throat, tinnitus and trouble swallowing.   Respiratory: Positive for cough. Negative for shortness of breath and wheezing.   Cardiovascular:  Negative for chest pain, palpitations and leg swelling.  Gastrointestinal: Negative for nausea, vomiting and abdominal pain.  Genitourinary: Negative for dysuria.  Neurological: Negative for dizziness, light-headedness and headaches.  Hematological: Negative for adenopathy.  Psychiatric/Behavioral: Negative for behavioral problems, confusion and agitation. The patient is nervous/anxious.        Objective:   Physical Exam  Nursing note and vitals reviewed. Constitutional: She is oriented to person, place, and time. She appears well-nourished. No distress.  HENT:  Head: Atraumatic.  Nose: Nose normal.  Mouth/Throat: Oropharynx is clear and moist. No oropharyngeal exudate.  Bilateral TM with erythema, and amber colored fluid. Decreased hearing bilaterally per whisper hearing test.    Eyes: Conjunctivae are normal. Right eye exhibits no discharge. Left eye exhibits no discharge. No scleral icterus.  Neck: No JVD present. No thyromegaly present.  Cardiovascular: Normal rate and regular rhythm.   Pulmonary/Chest: Effort normal. No respiratory distress. She has no wheezes. She has rales. She exhibits no tenderness.  Bibasilar fine crackles.  Abdominal: Soft. Bowel sounds are normal. She exhibits distension. There is no tenderness. There is no guarding.  Mild distention surrounding her colostomy. Colostomy bag in place with brown stool content with no surrounding edema or erythema, no leaks.   Musculoskeletal: She exhibits no edema and no tenderness.  Lymphadenopathy:    She has no cervical adenopathy.  Neurological: She is alert and oriented to person, place, and time. Coordination normal.  Skin: Skin is warm and dry. She is not diaphoretic. No erythema.  Psychiatric:  Labile mood, tearful at times.           Assessment & Plan:

## 2012-12-07 ENCOUNTER — Telehealth: Payer: Self-pay | Admitting: *Deleted

## 2012-12-07 DIAGNOSIS — D518 Other vitamin B12 deficiency anemias: Secondary | ICD-10-CM | POA: Diagnosis not present

## 2012-12-07 DIAGNOSIS — J441 Chronic obstructive pulmonary disease with (acute) exacerbation: Secondary | ICD-10-CM | POA: Diagnosis not present

## 2012-12-07 DIAGNOSIS — H3530 Unspecified macular degeneration: Secondary | ICD-10-CM | POA: Diagnosis not present

## 2012-12-07 DIAGNOSIS — Z433 Encounter for attention to colostomy: Secondary | ICD-10-CM | POA: Diagnosis not present

## 2012-12-07 DIAGNOSIS — F3289 Other specified depressive episodes: Secondary | ICD-10-CM | POA: Diagnosis not present

## 2012-12-07 DIAGNOSIS — Z602 Problems related to living alone: Secondary | ICD-10-CM | POA: Diagnosis not present

## 2012-12-07 NOTE — Telephone Encounter (Signed)
Received faxed PA request from pt's pharmacy for  pinnacaine 20% otic drops.  Contacted pt's insurance at [phone] to initiate PA, medication is "non-formulary" and requires a "formulary exception" form to be completed by MD and submitted to insurance for review.  I inquired about alt meds and the only thing they offered to cover is ofloxacin (of note, pt was also rx's PO antibiotic at visit).  On [facility]'s 4$ formulary, they have antipyrine-benzocaine (Auralgan) for 15 ml bottle.  Please advise.Criss Alvine, Darlene Cassady11/12/20141:08 PM

## 2012-12-08 ENCOUNTER — Other Ambulatory Visit: Payer: Self-pay | Admitting: Internal Medicine

## 2012-12-08 MED ORDER — ANTIPYRINE-BENZOCAINE 5.4-1.4 % OT SOLN: 3.0000 [drp] | OTIC | Status: DC | PRN

## 2012-12-08 NOTE — Telephone Encounter (Signed)
[name] Bell,    I sent the Rx for antipyrine-benzocaine 15ml bottle, electronically to her Pharmacy, [facility]. If she can go to [place], could you call it in to them? Thanks! SK.

## 2012-12-08 NOTE — Telephone Encounter (Signed)
[facility] nursing staff faxed to [facility], CSW sent to [facility] liaison.  HH RN will assess for [facility] OT.  CSW will sign off.

## 2012-12-28 ENCOUNTER — Encounter: Payer: Self-pay | Admitting: Internal Medicine

## 2012-12-28 ENCOUNTER — Ambulatory Visit (INDEPENDENT_AMBULATORY_CARE_PROVIDER_SITE_OTHER): Payer: Medicare Other | Admitting: Internal Medicine

## 2012-12-28 VITALS — BP 117/66 | HR 110 | Temp 99.1°F | Wt 138.4 lb

## 2012-12-28 DIAGNOSIS — J329 Chronic sinusitis, unspecified: Secondary | ICD-10-CM

## 2012-12-28 DIAGNOSIS — H6690 Otitis media, unspecified, unspecified ear: Secondary | ICD-10-CM | POA: Diagnosis not present

## 2012-12-28 DIAGNOSIS — B029 Zoster without complications: Secondary | ICD-10-CM | POA: Insufficient documentation

## 2012-12-28 DIAGNOSIS — H6692 Otitis media, unspecified, left ear: Secondary | ICD-10-CM | POA: Insufficient documentation

## 2012-12-28 DIAGNOSIS — Z0000 Encounter for general adult medical examination without abnormal findings: Secondary | ICD-10-CM

## 2012-12-28 DIAGNOSIS — Z933 Colostomy status: Secondary | ICD-10-CM | POA: Diagnosis not present

## 2012-12-28 DIAGNOSIS — H3530 Unspecified macular degeneration: Secondary | ICD-10-CM | POA: Diagnosis not present

## 2012-12-28 DIAGNOSIS — Z433 Encounter for attention to colostomy: Secondary | ICD-10-CM | POA: Diagnosis not present

## 2012-12-28 MED ORDER — AMOXICILLIN-POT CLAVULANATE 875-125 MG PO TABS: 1.0000 | ORAL_TABLET | Freq: Two times a day (BID) | ORAL | Status: DC

## 2012-12-28 MED ORDER — VALACYCLOVIR HCL 1 G PO TABS: 1000.0000 mg | ORAL_TABLET | Freq: Three times a day (TID) | ORAL | Status: DC

## 2012-12-28 NOTE — Patient Instructions (Addendum)
General Instructions: Please read the information below  Please follow up with your regular doctor  3-4 months sooner if needed if your ear/sinus pain is not improving F/u with Dr. Karie Soda 01/23/13 at 1:45 PM  Please take medication as instructed  You can take Tylenol for pain for ear and back rash, try saline nasal spray as well Return for the flu shot soon   Treatment Goals:  Goals (1 Years of Data) as of 12/28/12   None      Progress Toward Treatment Goals:  Treatment Goal 05/11/2012  Stop smoking smoking the same amount    Self Care Goals & Plans:  Self Care Goal 05/11/2012  Manage my medications take my medicines as prescribed; bring my medications to every visit; refill my medications on time  Eat healthy foods drink diet soda or water instead of juice or soda  Be physically active find an activity I enjoy  Stop smoking cut down the number of cigarettes smoked    No flowsheet data found.   Care Management & Community Referrals:  Referral 12/28/2012  Referrals made for care management support -  Referrals made to community resources none      Colostomy Home Guide A colostomy is an opening for stool to leave your body when a medical condition prevents it from leaving through the usual opening (rectum). During a surgery, a piece of large intestine (colon) is brought through a hole in the abdominal wall. The new opening is called a stoma or ostomy. A bag or pouch fits over the stoma to catch stool and gas. Your stool may be liquid, somewhat pasty, or formed. CARING FOR YOUR STOMA  Normally, the stoma looks a lot like the inside of your cheek: pink, red, and moist. At first it may be swollen, but this swelling will decrease within 6 weeks. Keep the skin around your stoma clean and dry. You can gently wash your stoma and the skin around your stoma in the shower with a clean, soft washcloth. If you develop any skin irritation, your caregiver may give you a stoma powder or  ointment to help heal the area. Do not use any products other than those specifically given to you by your caregiver.  Your stoma should not be uncomfortable. If you notice any stinging or burning, your pouch may be leaking, and the skin around your stoma may be coming into contact with stool. This can cause skin irritation. If you notice stinging, replace your pouch with a new one and discard the old one. OSTOMY POUCHES  The pouch that fits over the ostomy can be made up of either 1 or 2 pieces. A one-piece pouch has a skin barrier piece and the pouch itself in one unit. A two-piece pouch has a skin barrier with a separate pouch that snaps on and off of the skin barrier. Either way, you should empty the pouch when it is only  to  full. Do not let more stool or gas build up. This could cause the pouch to leak. Some ostomy bags have a built-in gas release valve. Ostomy deodorizer (5 drops) can be put into the pouch to prevent odor. Some people use ostomy lubricant drops inside the pouch to help the stool slide out of the bag more easily and completely.  EMPTYING YOUR OSTOMY POUCH  You may get lessons on how to empty your pouch from a wound-ostomy nurse before you leave the hospital. Here are the basic steps:  Wash your hands  with soap and water.  Sit far back on the toilet.  Put several pieces of toilet paper into the toilet water. This will prevent splashing as you empty the stool into the toilet bowl.  Unclip or unvelcro the tail end of the pouch.  Unroll the tail and empty stool into the toilet.  Clean the tail with toilet paper.  Reroll the tail, and clip or velcro it closed.  Wash your hands again. CHANGING YOUR OSTOMY POUCH  Change your ostomy pouch about every 3 to 4 days for the first 6 weeks, then every 5 to7 days. Always change the bag sooner if there is any leakage or you begin to notice any discomfort or irritation of the skin around the stoma. When possible, plan to change your  ostomy pouch before eating or drinking as this will lessen the chance of stool coming out during the pouch change. A wound-ostomy nurse may teach you how to change your pouch before you leave the hospital. Here are the basic steps:  Lay out your supplies.  Wash your hands with soap and water.  Carefully remove the old pouch.  Wash the stoma and allow it to dry. Men may be advised to shave any hair around the stoma very carefully. This will make the adhesive stick better.  Use the stoma measuring guide that comes with your pouch set to decide what size hole you will need to cut in the skin barrier piece. Choose the smallest possible size that will hold the stoma but will not touch it.  Use the guide to trace the circle on the back of the skin barrier piece. Cut out the hole.  Hold the skin barrier piece over the stoma to make sure the hole is the correct size.  Remove the adhesive paper backing from the skin barrier piece.  Squeeze stoma paste around the opening of the skin barrier piece.  Clean and dry the skin around the stoma again.  Carefully fit the skin barrier piece over your stoma.  If you are using a two-piece pouch, snap the pouch onto the skin barrier piece.  Close the tail of the pouch.  Put your hand over the top of the skin barrier piece to help warm it for about 5 minutes, so that it conforms to your body better.  Wash your hands again. DIET TIPS   Continue to follow your usual diet.  Drink about eight 8 oz glasses of water each day.  You can prevent gas by eating slowly and chewing your food thoroughly.  If you feel concerned that you have too much gas, you can cut back on gas-producing foods, such as:  Spicy foods.  Onions and garlic.  Cruciferous vegetables (cabbage, broccoli, cauliflower, Brussels sprouts).  Beans and legumes.  Some cheeses.  Eggs.  Fish.  Bubbly (carbonated) drinks.  Chewing gum. GENERAL TIPS   You can shower with or  without the bag in place.  Always keep the bag on if you are bathing or swimming.  If your bag gets wet, you can dry it with a blow-dryer set to cool.  Avoid wearing tight clothing directly over your stoma so that it does not become irritated or bleed. Tight clothing can also prevent stool from draining into the pouch.  It is helpful to always have an extra skin barrier and pouch with you when traveling. Do not leave them anywhere too warm, as parts of them can melt.  Do not let your seat belt rest on  your stoma. Try to keep the seat belt either above or below your stoma, or use a tiny pillow to cushion it.  You can still participate in sports, but you should avoid activities in which there is a risk of getting hit in the abdomen.  You can still have sex. It is a good idea to empty your pouch prior to sex. Some people and their partners feel very comfortable seeing the pouch during sex. Others choose to wear lingerie or a T-shirt that covers the device. SEEK IMMEDIATE MEDICAL CARE IF:  You notice a change in the size or color of the stoma, especially if it becomes very red, purple, black, or pale white.  You have bloody stools or bleeding from the stoma.  You have abdominal pain, nausea, vomiting, or bloating.  There is anything unusual protruding from the stoma.  You have irritation or red skin around the stoma.  No stool is passing from the stoma.  You have diarrhea (requiring more frequent than normal pouch emptying). Document Released: 01/15/2003 Document Revised: 04/06/2011 Document Reviewed: 06/11/2010 [facility] Patient Information 2014 [place], [place].  Sinusitis Sinusitis is redness, soreness, and swelling (inflammation) of the paranasal sinuses. Paranasal sinuses are air pockets within the bones of your face (beneath the eyes, the middle of the forehead, or above the eyes). In healthy paranasal sinuses, mucus is able to drain out, and air is able to circulate through them by  way of your nose. However, when your paranasal sinuses are inflamed, mucus and air can become trapped. This can allow bacteria and other germs to grow and cause infection. Sinusitis can develop quickly and last only a short time (acute) or continue over a long period (chronic). Sinusitis that lasts for more than 12 weeks is considered chronic.  CAUSES  Causes of sinusitis include:  Allergies.  Structural abnormalities, such as displacement of the cartilage that separates your nostrils (deviated septum), which can decrease the air flow through your nose and sinuses and affect sinus drainage.  Functional abnormalities, such as when the small hairs (cilia) that line your sinuses and help remove mucus do not work properly or are not present. SYMPTOMS  Symptoms of acute and chronic sinusitis are the same. The primary symptoms are pain and pressure around the affected sinuses. Other symptoms include:  Upper toothache.  Earache.  Headache.  Bad breath.  Decreased sense of smell and taste.  A cough, which worsens when you are lying flat.  Fatigue.  Fever.  Thick drainage from your nose, which often is green and may contain pus (purulent).  Swelling and warmth over the affected sinuses. DIAGNOSIS  Your caregiver will perform a physical exam. During the exam, your caregiver may:  Look in your nose for signs of abnormal growths in your nostrils (nasal polyps).  Tap over the affected sinus to check for signs of infection.  View the inside of your sinuses (endoscopy) with a special imaging device with a light attached (endoscope), which is inserted into your sinuses. If your caregiver suspects that you have chronic sinusitis, one or more of the following tests may be recommended:  Allergy tests.  Nasal culture A sample of mucus is taken from your nose and sent to a lab and screened for bacteria.  Nasal cytology A sample of mucus is taken from your nose and examined by your caregiver  to determine if your sinusitis is related to an allergy. TREATMENT  Most cases of acute sinusitis are related to a viral infection and will  resolve on their own within 10 days. Sometimes medicines are prescribed to help relieve symptoms (pain medicine, decongestants, nasal steroid sprays, or saline sprays).  However, for sinusitis related to a bacterial infection, your caregiver will prescribe antibiotic medicines. These are medicines that will help kill the bacteria causing the infection.  Rarely, sinusitis is caused by a fungal infection. In theses cases, your caregiver will prescribe antifungal medicine. For some cases of chronic sinusitis, surgery is needed. Generally, these are cases in which sinusitis recurs more than 3 times per year, despite other treatments. HOME CARE INSTRUCTIONS   Drink plenty of water. Water helps thin the mucus so your sinuses can drain more easily.  Use a humidifier.  Inhale steam 3 to 4 times a day (for example, sit in the bathroom with the shower running).  Apply a warm, moist washcloth to your face 3 to 4 times a day, or as directed by your caregiver.  Use saline nasal sprays to help moisten and clean your sinuses.  Take over-the-counter or prescription medicines for pain, discomfort, or fever only as directed by your caregiver. SEEK IMMEDIATE MEDICAL CARE IF:  You have increasing pain or severe headaches.  You have nausea, vomiting, or drowsiness.  You have swelling around your face.  You have vision problems.  You have a stiff neck.  You have difficulty breathing. MAKE SURE YOU:   Understand these instructions.  Will watch your condition.  Will get help right away if you are not doing well or get worse. Document Released: 01/12/2005 Document Revised: 04/06/2011 Document Reviewed: 01/27/2011 [facility] Patient Information 2014 [place], [place].    Otitis Media, Adult A middle ear infection is an infection in the space behind the eardrum.  The medical name for this is "otitis media." It may happen after a common cold. It is caused by a germ that starts growing in that space. You may feel swollen glands in your neck on the side of the ear infection. HOME CARE INSTRUCTIONS   Take your medicine as directed until it is gone, even if you feel better after the first few days.  Only take over-the-counter or prescription medicines for pain, discomfort, or fever as directed by your caregiver.  Occasional use of a nasal decongestant a couple times per day may help with discomfort and help the eustachian tube to drain better. Follow up with your caregiver in 10 to 14 days or as directed, to be certain that the infection has cleared. Not keeping the appointment could result in a chronic or permanent injury, pain, hearing loss and disability. If there is any problem keeping the appointment, you must call back to this facility for assistance. SEEK IMMEDIATE MEDICAL CARE IF:   You are not getting better in 2 to 3 days.  You have pain that is not controlled with medication.  You feel worse instead of better.  You cannot use the medication as directed.  You develop swelling, redness or pain around the ear or stiffness in your neck. MAKE SURE YOU:   Understand these instructions.  Will watch your condition.  Will get help right away if you are not doing well or get worse. Document Released: 10/18/2003 Document Revised: 04/06/2011 Document Reviewed: 08/09/2012 [facility] Patient Information 2014 [place], [place].  Cholesterol Cholesterol is a white, waxy, fat-like protein needed by your body in small amounts. The liver makes all the cholesterol you need. It is carried from the liver by the blood through the blood vessels. Deposits (plaque) may build up on  blood vessel walls. This makes the arteries narrower and stiffer. Plaque increases the risk for heart attack and stroke. You cannot feel your cholesterol level even if it is very high. The  only way to know is by a blood test to check your lipid (fats) levels. Once you know your cholesterol levels, you should keep a record of the test results. Work with your caregiver to to keep your levels in the desired range. WHAT THE RESULTS MEAN:  Total cholesterol is a rough measure of all the cholesterol in your blood.  LDL is the so-called bad cholesterol. This is the type that deposits cholesterol in the walls of the arteries. You want this level to be low.  HDL is the good cholesterol because it cleans the arteries and carries the LDL away. You want this level to be high.  Triglycerides are fat that the body can either burn for energy or store. High levels are closely linked to heart disease. DESIRED LEVELS:  Total cholesterol below 200.  LDL below 100 for people at risk, below 70 for very high risk.  HDL above 50 is good, above 60 is best.  Triglycerides below 150. HOW TO LOWER YOUR CHOLESTEROL:  Diet.  Choose fish or white meat chicken and [place], roasted or baked. Limit fatty cuts of red meat, fried foods, and processed meats, such as sausage and lunch meat.  Eat lots of fresh fruits and vegetables. Choose whole grains, beans, pasta, potatoes and cereals.  Use only small amounts of olive, corn or canola oils. Avoid butter, mayonnaise, shortening or palm kernel oils. Avoid foods with trans-fats.  Use skim/nonfat milk and low-fat/nonfat yogurt and cheeses. Avoid whole milk, cream, ice cream, egg yolks and cheeses. Healthy desserts include angel food cake, ginger snaps, animal crackers, hard candy, popsicles, and low-fat/nonfat frozen yogurt. Avoid pastries, cakes, pies and cookies.  Exercise.  A regular program helps decrease LDL and raises HDL.  Helps with weight control.  Do things that increase your activity level like gardening, walking, or taking the stairs.  Medication.  May be prescribed by your caregiver to help lowering cholesterol and the risk for heart  disease.  You may need medicine even if your levels are normal if you have several risk factors. HOME CARE INSTRUCTIONS   Follow your diet and exercise programs as suggested by your caregiver.  Take medications as directed.  Have blood work done when your caregiver feels it is necessary. MAKE SURE YOU:   Understand these instructions.  Will watch your condition.  Will get help right away if you are not doing well or get worse. Document Released: 10/07/2000 Document Revised: 04/06/2011 Document Reviewed: 03/30/2007 [facility] Patient Information 2014 [place], [place].  Amoxicillin; Clavulanic Acid chewable tablets What is this medicine? AMOXICILLIN; CLAVULANIC ACID (a mox i SIL in; KLAV yoo lan ic AS id) is a penicillin antibiotic. It is used to treat certain kinds of bacterial infections. It It will not work for colds, flu, or other viral infections. This medicine may be used for other purposes; ask your health care provider or pharmacist if you have questions. COMMON BRAND NAME(S): Augmentin What should I tell my health care provider before I take this medicine? They need to know if you have any of these conditions: -bowel disease, like colitis -kidney disease -liver disease -mononucleosis -phenylketonuria -an unusual or allergic reaction to amoxicillin, penicillin, cephalosporin, other antibiotics, clavulanic acid, other medicines, foods, dyes, or preservatives -pregnant or trying to get pregnant -breast-feeding How should I use  this medicine? Take this medicine by mouth. Chew it completely before swallowing. Follow the directions on the prescription label. Take this medicine at the start of a meal or snack. Take your medicine at regular intervals. Do not take your medicine more often than directed. Take all of your medicine as directed even if you think you are better. Do not skip doses or stop your medicine early. Talk to your pediatrician regarding the use of this medicine in  children. While this drug may be prescribed for selected conditions, precautions do apply. Overdosage: If you think you have taken too much of this medicine contact a poison control center or emergency room at once. NOTE: This medicine is only for you. Do not share this medicine with others. What if I miss a dose? If you miss a dose, take it as soon as you can. If it is almost time for your next dose, take only that dose. Do not take double or extra doses. What may interact with this medicine? -allopurinol -anticoagulants -birth control pills -methotrexate -probenecid This list may not describe all possible interactions. Give your health care provider a list of all the medicines, herbs, non-prescription drugs, or dietary supplements you use. Also tell them if you smoke, drink alcohol, or use illegal drugs. Some items may interact with your medicine. What should I watch for while using this medicine? Tell your doctor or health care professional if your symptoms do not improve. Do not treat diarrhea with over the counter products. Contact your doctor if you have diarrhea that lasts more than 2 days or if it is severe and watery. If you have diabetes, you may get a false-positive result for sugar in your urine. Check with your doctor or health care professional. Birth control pills may not work properly while you are taking this medicine. Talk to your doctor about using an extra method of birth control. What side effects may I notice from receiving this medicine? Side effects that you should report to your doctor or health care professional as soon as possible: -allergic reactions like skin rash, itching or hives, swelling of the face, lips, or tongue -breathing problems -dark urine -fever or chills, sore throat -redness, blistering, peeling or loosening of the skin, including inside the mouth -seizures -trouble passing urine or change in the amount of urine -unusual bleeding,  bruising -unusually weak or tired -white patches or sores in the mouth or throat Side effects that usually do not require medical attention (report to your doctor or health care professional if they continue or are bothersome): -diarrhea -dizziness -headache -nausea, vomiting -stomach upset -vaginal or anal irritation This list may not describe all possible side effects. Call your doctor for medical advice about side effects. You may report side effects to FDA at 1-800-FDA-1088. Where should I keep my medicine? Keep out of the reach of children. Store at room temperature below 25 degrees C (77 degrees F). Keep container tightly closed. Throw away any unused medicine after the expiration date. NOTE: This sheet is a summary. It may not cover all possible information. If you have questions about this medicine, talk to your doctor, pharmacist, or health care provider.  2014, [employer] Standard. (2007-04-07 11:38:22)  Valacyclovir caplets What is this medicine? VALACYCLOVIR (val ay SYE kloe veer) is an antiviral medicine. It is used to treat or prevent infections caused by certain kinds of viruses. Examples of these infections include herpes and shingles. This medicine will not cure herpes. This medicine may be used for  other purposes; ask your health care provider or pharmacist if you have questions. COMMON BRAND NAME(S): Valtrex What should I tell my health care provider before I take this medicine? They need to know if you have any of these conditions: -acquired immunodeficiency syndrome (AIDS) -any other condition that may weaken the immune system -bone marrow or kidney transplant -kidney disease -an unusual or allergic reaction to valacyclovir, acyclovir, ganciclovir, valganciclovir, other medicines, foods, dyes, or preservatives -pregnant or trying to get pregnant -breast-feeding How should I use this medicine? Take this medicine by mouth with a glass of water. Follow the  directions on the prescription label. You can take this medicine with or without food. Take your doses at regular intervals. Do not take your medicine more often than directed. Finish the full course prescribed by your doctor or health care professional even if you think your condition is better. Do not stop taking except on the advice of your doctor or health care professional. Talk to your pediatrician regarding the use of this medicine in children. While this drug may be prescribed for children as young as 2 years for selected conditions, precautions do apply. Overdosage: If you think you have taken too much of this medicine contact a poison control center or emergency room at once. NOTE: This medicine is only for you. Do not share this medicine with others. What if I miss a dose? If you miss a dose, take it as soon as you can. If it is almost time for your next dose, take only that dose. Do not take double or extra doses. What may interact with this medicine? -cimetidine -probenecid This list may not describe all possible interactions. Give your health care provider a list of all the medicines, herbs, non-prescription drugs, or dietary supplements you use. Also tell them if you smoke, drink alcohol, or use illegal drugs. Some items may interact with your medicine. What should I watch for while using this medicine? Tell your doctor or health care professional if your symptoms do not start to get better after 1 week. This medicine works best when taken early in the course of an infection, within the first 72 hours. Begin treatment as soon as possible after the first signs of infection like tingling, itching, or pain in the affected area. It is possible that genital herpes may still be spread even when you are not having symptoms. Always use safer sex practices like condoms made of latex or polyurethane whenever you have sexual contact. You should stay well hydrated while taking this medicine. Drink  plenty of fluids. What side effects may I notice from receiving this medicine? Side effects that you should report to your doctor or health care professional as soon as possible: -allergic reactions like skin rash, itching or hives, swelling of the face, lips, or tongue -aggressive behavior -confusion -hallucinations -problems with balance, talking, walking -stomach pain -tremor -trouble passing urine or change in the amount of urine Side effects that usually do not require medical attention (report to your doctor or health care professional if they continue or are bothersome): -dizziness -headache -nausea, vomiting This list may not describe all possible side effects. Call your doctor for medical advice about side effects. You may report side effects to FDA at 1-800-FDA-1088. Where should I keep my medicine? Keep out of the reach of children. Store at room temperature between 15 and 25 degrees C (59 and 77 degrees F). Keep container tightly closed. Throw away any unused medicine after the expiration date.  NOTE: This sheet is a summary. It may not cover all possible information. If you have questions about this medicine, talk to your doctor, pharmacist, or health care provider.  2014, Elsevier/Gold Standard. (2011-12-29 16:34:05)

## 2012-12-28 NOTE — Assessment & Plan Note (Signed)
Skin breakdown per pt at left lower edge appt made with Dr. Michaell Cowing 01/23/13 at 1:45 PM

## 2012-12-28 NOTE — Assessment & Plan Note (Signed)
There is possibly min. Fluid in left ear Will tx with Augmentin 875-125 bid x 7 days  Return if no improvement

## 2012-12-28 NOTE — Assessment & Plan Note (Addendum)
Etiology could be bacterial or viral.  She denies fever/chills Since she has had a history of sinusitis and has potential unresolved ear infection will tx with antibiotics Augmentin 857-125 bid x 7 days  She can use Tylenol for pain, saline nasal spray

## 2012-12-28 NOTE — Assessment & Plan Note (Signed)
She is due for mammogram, flu shot (declines today), no documented colonoscopy in chart.   Will let PCP handle these items

## 2012-12-28 NOTE — Progress Notes (Signed)
Subjective:    Patient ID: [name] Bell, female    DOB: Mar 10, 1945, 67 y.o.   MRN: [id number]  HPI Comments: 67 y.o woman PMH superior mesenteric artery syndrome s/p dilation by Dr. Ewing Schlein, duodenal ulcer with hemorrhage and obstruction, Osteomyelitis, jaw acute s/p debridement, Degenerative disk disease, Back pain, Depression, h/o acute sinusitis, h/o Ischemic colitis, +colostomy, h/o perforated bowel s/p surgery, chronic pain, h/o constipation, COPD, h/o depression, h/o diverticulitis, HLD (LDL 143 09/2012), h/o macular degeneration on injections, h/o osteoporosis, h/o physical deconditioning, h/o tobacco  Abuse, vitamin B12 deficiency, h/o shingles.    CC's 1. She presents with left ear pain that she noted today.  She was recently tx'ed for ear infection with Amoxicillin x 5 days in Nov. 2014.  She has a h/o left ear infections.  She states after she took the Abx her infection cleared and about a week ago she noticed itching, which has progressed to pain in the left ear this am 8/10 pain, decreased hearing, pressure/fullness in the left ear.  She also has sinus ttp only on the left side of her face and denies ever having a CT scan of her sinuses.   2. Sore near left of colostomy bag which is increasing in size.  H/H RN noticed the sore prior to the patient coming and told her to notify her primary care doctor.  She has had the colostomy for about 1 year as of 09/2012 by Dr. Karie Soda.   She also states she has a hernia within the ostomy.  He is able to see patient Dr. Michaell Cowing at 01/23/13 at 1:45 PM.   3. Patient has a sore on her right back she would like checked out.  The area has been oozing and painful.  The pain came 1st about 3 days ago.  Patient denies ever seeing blisters.    SH: No family. Patient goes to church. She had a son who is deceased and all her other family is deceased.  HM: She is due for mammogram, flu shot, no documented colonoscopy in chart.       Review of Systems    Constitutional: Negative for fever and chills.  HENT: Positive for rhinorrhea and sinus pressure.        When pt blows nose drainage is yellow, green or dark though she cant see with h/o macular degeneration  Skin: Positive for rash.       Skin breakdown by ostomy site (left lower)        Objective:   Physical Exam  Nursing note and vitals reviewed. Constitutional: She is oriented to person, place, and time. Vital signs are normal. She appears well-developed and well-nourished. She is cooperative. No distress.  HENT:  Head: Normocephalic and atraumatic.  Right Ear: Hearing, tympanic membrane, external ear and ear canal normal.  Left Ear: Tympanic membrane, external ear and ear canal normal. Decreased hearing is noted.  Nose: Right sinus exhibits no maxillary sinus tenderness and no frontal sinus tenderness. Left sinus exhibits maxillary sinus tenderness. Left sinus exhibits no frontal sinus tenderness.  Mouth/Throat: She has dentures. No oropharyngeal exudate.  Erythema and possibly fluid in left ear  ttp left ethmoid sinus   Eyes: Conjunctivae are normal. Right eye exhibits no discharge. Left eye exhibits no discharge. No scleral icterus.  Cardiovascular: Normal heart sounds.   No murmur heard. Abdominal:    Neurological: She is alert and oriented to person, place, and time. Gait normal.  Skin: Skin is warm and dry.  She is not diaphoretic.     Psychiatric: She has a normal mood and affect. Her speech is normal and behavior is normal. Judgment and thought content normal. Cognition and memory are normal.          Assessment & Plan:  F/u with Dr. Karie Soda 01/23/13 at 1:45 PM  F/u with PCP in 3-4 months sooner if needed

## 2012-12-28 NOTE — Assessment & Plan Note (Addendum)
Will tx with Valacyclovir 1000 mg tid x 7 days  Can try Tylenol for pain

## 2012-12-29 NOTE — Addendum Note (Signed)
Addended by: Debe Coder B on: 12/29/2012 10:58 AM   Modules accepted: Level of Service

## 2012-12-29 NOTE — Progress Notes (Signed)
Case discussed with Dr. McLean at the time of the visit.  We reviewed the resident's history and exam and pertinent patient test results.  I agree with the assessment, diagnosis, and plan of care documented in the resident's note.     

## 2013-01-09 ENCOUNTER — Telehealth: Payer: Self-pay | Admitting: *Deleted

## 2013-01-09 ENCOUNTER — Other Ambulatory Visit: Payer: Self-pay | Admitting: Internal Medicine

## 2013-01-09 NOTE — Telephone Encounter (Signed)
I approve, thank you. 

## 2013-01-09 NOTE — Telephone Encounter (Signed)
[facility] calls for verbal approval for a home social work visit, it was given, do you approve?

## 2013-01-23 ENCOUNTER — Encounter (INDEPENDENT_AMBULATORY_CARE_PROVIDER_SITE_OTHER): Payer: Self-pay | Admitting: Surgery

## 2013-01-23 ENCOUNTER — Encounter (INDEPENDENT_AMBULATORY_CARE_PROVIDER_SITE_OTHER): Payer: Self-pay

## 2013-01-23 ENCOUNTER — Ambulatory Visit (INDEPENDENT_AMBULATORY_CARE_PROVIDER_SITE_OTHER): Payer: Medicare Other | Admitting: Surgery

## 2013-01-23 VITALS — BP 110/70 | HR 120 | Temp 98.1°F | Resp 15 | Ht 64.0 in | Wt 134.6 lb

## 2013-01-23 DIAGNOSIS — K5732 Diverticulitis of large intestine without perforation or abscess without bleeding: Secondary | ICD-10-CM | POA: Diagnosis not present

## 2013-01-23 DIAGNOSIS — K5720 Diverticulitis of large intestine with perforation and abscess without bleeding: Secondary | ICD-10-CM

## 2013-01-23 DIAGNOSIS — Z933 Colostomy status: Secondary | ICD-10-CM | POA: Diagnosis not present

## 2013-01-23 NOTE — Patient Instructions (Signed)
Please see [facility] for a an outpatient wound ostomy consultation with [name] Bell.  She can help troubleshoot the issues around the stoma  Ostomy Support Information  Yes, you've heard that people get along just fine with only one of their eyes, or one of their lungs, or one of their kidneys. But you also know that you have only one intestine and only one bladder, and that leaves you feeling awfully empty, both physically and emotionally: You think no other people go around without part of their intestine with the ends of their intestines sticking out through their abdominal walls.  Well, you are wrong! There are nearly three quarters of a million people in the [place] who have an ostomy; people who have had surgery to remove all or part of their colons or bladders. There is even a national association, the [place] [employer] [place] with over 350 local affiliated support groups that are organized by volunteers who provide peer support and counseling. [name] Bell has a toll free telephone num-ber, [phone] and an educational,  interactive website, www.ostomy.org   An ostomy is an opening in the belly (abdominal wall) made by surgery. Ostomates are people who have had this procedure. The opening (stoma) allows the kidney or bowel to discharge waste. An external pouch covers the stoma to collect waste. Pouches are are a simple bag and are odor free. Different companies have disposable or reusable pouches to fit one's lifestyle. An ostomy can either be temporary or permanent.  THERE ARE THREE MAIN TYPES OF OSTOMIES  Colostomy. A colostomy is a surgically created opening in the large intestine (colon).  Ileostomy. An ileostomy is a surgically created opening in the small intestine.  Urostomy. A urostomy is a surgically created opening to divert urine away from the bladder. FREQUENTLY ASKED QUESTIONS   Why haven't you met any of these folks who have an ostomy?  Well, maybe you  have! You just did not recognize them because an ostomy doesn't show. It can be kept secret if you wish. Why, maybe some of your best friends, office associates or neighbors have an ostomy ... you never can tell.   People facing ostomy surgery have many quality-of-life questions like:  Will you bulge? Smell? Make noises? Will you feel waste leaving your body? Will you be a captive of the toilet? Will you starve? Be a social outcast? Get/stay married? Have babies? Easily bathe, go swimming, bend over?  OK, let's look at what you can expect:  Will you bulge?  Remember, without part of the intestine or bladder, and its contents, you should have a flatter tummy than before. You can expect to wear, with little exception, what you wore before surgery ... and this in-cludes tight clothing and bathing suits.  Will you smell?  Today, thanks to modern odor proof pouching systems, you can walk into an ostomy support group meeting and not smell anything that is foul or offensive. And, for those with an ileostomy or colostomy who are concerned about odor when emptying their pouch, there are in-pouch deodorants that can be used to eliminate any waste odors that may exist.  Will you make noises?  Everyone produces gas, especially if they are an air-swallower. But intestinal sounds that occur from time to time are no differ-ent than a gurgling tummy, and quite often your clothing will muffle any sounds.   Will you feel the waste discharges?  For those with a colostomy or ileostomy there might be a slight pressure  when waste leaves your body, but understand that the intestines have no nerve endings, so there will be no unpleasant sensations. Those with a urostomy will probably be unaware of any kidney drainage.  Will you be a captive of the toilet?  Immediately post-op you will spend more time in the bathroom than you will after your body recovers from surgery. Every person is different, but on average those with an  ileostomy or urostomy may empty their pouches 4 to 6 times a day; a little  less if you have a colostomy. The average wear time between pouch system changes is 3 to 5 days and the changing process should take less than 30 minutes.  Will I need to be on a special diet? Most people return to their normal diet when they have recovered from surgery. Be sure to chew your food well, eat a well-balanced diet and drink plenty of fluids. If you experience problems with a certain food, wait a couple of weeks and try it again. Will there be odor and noises? Pouching systems are designed to be odor-proof or odor-resistant. There are deodorants that can be used in the pouch. Medications are also available to help reduce odor. Limit gas-producing foods and carbonated beverages. You will experience less gas and fewer noises as you heal from surgery. How much time will it take to care for my ostomy? At first, you may spend a lot of time learning about your ostomy and how to take care of it. As you become more comfortable and skilled at changing the pouching system, it will take very little time to care for it.  Will I be able to return to work? People with ostomies can perform most jobs. As soon as you have healed from surgery, you should be able to return to work. Heavy lifting (more than 10 pounds) may be discouraged.  What about intimacy? Sexual relationships and intimacy are important and fulfilling aspects of your life. They should continue after ostomy surgery. Intimacy-related concerns should be discussed openly between you and your partner.  Can I wear regular clothing? You do not need to wear special clothing. Ostomy pouches are fairly flat and barely noticeable. Elastic undergarments will not hurt the stoma or prevent the ostomy from functioning.  Can I participate in sports? An ostomy should not limit your involvement in sports. Many people with ostomies are runners, skiers, swimmers or participate in  other active lifestyles. Talk with your caregiver first before doing heavy physical activity.  Will you starve?  Not if you follow doctor's orders at each stage of your post-op adjustment. There is no such thing as an "ostomy diet". Some people with an ostomy will be able to eat and tolerate anything; others may find diffi-culty with some foods. Each person is an individual and must determine, by trial, what is best for them. A good practice for all is to drink plenty of water.  Will you be a social outcast?  Have you met anyone who has an ostomy and is a social outcast? Why should you be the first? Only your attitude and self image will effect how you are treated. No confi-dent person is an Investment banker, corporate.   PROFESSIONAL HELP  Resources are available if you need help or have questions about your ostomy.    Specially trained nurses called Wound, Ostomy Continence Nurses (WOCN) are available for consultation in most major medical centers.   Consider getting an ostomy consult with [name] Bell at [facility] to  help troubleshoot collagen fittings and other issues with your ostomy: [phone]   The [employer] Ostomy Association (UOA) is a group made up of many local chapters throughout the [place]. These local groups hold meetings and provide support to prospective and existing ostomates. They sponsor educational events and have qualified visitors to make personal or telephone visits. Contact the UOA for the chapter nearest you and for other educational publications.  More detailed information can be found in Colostomy Guide, a publication of the [employer] (UOA). Contact UOA at 1-878-073-3891 or visit their web site at YellowSpecialist.at. The website contains links to other sites, suppliers and resources. Document Released: 01/15/2003 Document Revised: 04/06/2011 Document Reviewed: 05/16/2008 [facility] Patient Information 2013 [place], [place].    Colostomy Home Guide A  colostomy is an opening for stool to leave your body when a medical condition prevents it from leaving through the usual opening (rectum). During a surgery, a piece of large intestine (colon) is brought through a hole in the abdominal wall. The new opening is called a stoma or ostomy. A bag or pouch fits over the stoma to catch stool and gas. Your stool may be liquid, somewhat pasty, or formed. CARING FOR YOUR STOMA  Normally, the stoma looks a lot like the inside of your cheek: pink, red, and moist. At first it may be swollen, but this swelling will decrease within 6 weeks. Keep the skin around your stoma clean and dry. You can gently wash your stoma and the skin around your stoma in the shower with a clean, soft washcloth. If you develop any skin irritation, your caregiver may give you a stoma powder or ointment to help heal the area. Do not use any products other than those specifically given to you by your caregiver.  Your stoma should not be uncomfortable. If you notice any stinging or burning, your pouch may be leaking, and the skin around your stoma may be coming into contact with stool. This can cause skin irritation. If you notice stinging, replace your pouch with a new one and discard the old one. OSTOMY POUCHES  The pouch that fits over the ostomy can be made up of either 1 or 2 pieces. A one-piece pouch has a skin barrier piece and the pouch itself in one unit. A two-piece pouch has a skin barrier with a separate pouch that snaps on and off of the skin barrier. Either way, you should empty the pouch when it is only  to  full. Do not let more stool or gas build up. This could cause the pouch to leak. Some ostomy bags have a built-in gas release valve. Ostomy deodorizer (5 drops) can be put into the pouch to prevent odor. Some people use ostomy lubricant drops inside the pouch to help the stool slide out of the bag more easily and completely.  EMPTYING YOUR OSTOMY POUCH  You may get lessons on  how to empty your pouch from a wound-ostomy nurse before you leave the hospital. Here are the basic steps:  Wash your hands with soap and water.  Sit far back on the toilet.  Put several pieces of toilet paper into the toilet water. This will prevent splashing as you empty the stool into the toilet bowl.  Unclip or unvelcro the tail end of the pouch.  Unroll the tail and empty stool into the toilet.  Clean the tail with toilet paper.  Reroll the tail, and clip or velcro it closed.  Wash your hands again.  CHANGING YOUR OSTOMY POUCH  Change your ostomy pouch about every 3 to 4 days for the first 6 weeks, then every 5 to7 days. Always change the bag sooner if there is any leakage or you begin to notice any discomfort or irritation of the skin around the stoma. When possible, plan to change your ostomy pouch before eating or drinking as this will lessen the chance of stool coming out during the pouch change. A wound-ostomy nurse may teach you how to change your pouch before you leave the hospital. Here are the basic steps:  Lay out your supplies.  Wash your hands with soap and water.  Carefully remove the old pouch.  Wash the stoma and allow it to dry. Men may be advised to shave any hair around the stoma very carefully. This will make the adhesive stick better.  Use the stoma measuring guide that comes with your pouch set to decide what size hole you will need to cut in the skin barrier piece. Choose the smallest possible size that will hold the stoma but will not touch it.  Use the guide to trace the circle on the back of the skin barrier piece. Cut out the hole.  Hold the skin barrier piece over the stoma to make sure the hole is the correct size.  Remove the adhesive paper backing from the skin barrier piece.  Squeeze stoma paste around the opening of the skin barrier piece.  Clean and dry the skin around the stoma again.  Carefully fit the skin barrier piece over your  stoma.  If you are using a two-piece pouch, snap the pouch onto the skin barrier piece.  Close the tail of the pouch.  Put your hand over the top of the skin barrier piece to help warm it for about 5 minutes, so that it conforms to your body better.  Wash your hands again. DIET TIPS   Continue to follow your usual diet.  Drink about eight 8 oz glasses of water each day.  You can prevent gas by eating slowly and chewing your food thoroughly.  If you feel concerned that you have too much gas, you can cut back on gas-producing foods, such as:  Spicy foods.  Onions and garlic.  Cruciferous vegetables (cabbage, broccoli, cauliflower, Brussels sprouts).  Beans and legumes.  Some cheeses.  Eggs.  Fish.  Bubbly (carbonated) drinks.  Chewing gum. GENERAL TIPS   You can shower with or without the bag in place.  Always keep the bag on if you are bathing or swimming.  If your bag gets wet, you can dry it with a blow-dryer set to cool.  Avoid wearing tight clothing directly over your stoma so that it does not become irritated or bleed. Tight clothing can also prevent stool from draining into the pouch.  It is helpful to always have an extra skin barrier and pouch with you when traveling. Do not leave them anywhere too warm, as parts of them can melt.  Do not let your seat belt rest on your stoma. Try to keep the seat belt either above or below your stoma, or use a tiny pillow to cushion it.  You can still participate in sports, but you should avoid activities in which there is a risk of getting hit in the abdomen.  You can still have sex. It is a good idea to empty your pouch prior to sex. Some people and their partners feel very comfortable seeing the pouch during sex. Others  choose to wear lingerie or a T-shirt that covers the device. SEEK IMMEDIATE MEDICAL CARE IF:  You notice a change in the size or color of the stoma, especially if it becomes very red, purple, black, or  pale white.  You have bloody stools or bleeding from the stoma.  You have abdominal pain, nausea, vomiting, or bloating.  There is anything unusual protruding from the stoma.  You have irritation or red skin around the stoma.  No stool is passing from the stoma.  You have diarrhea (requiring more frequent than normal pouch emptying). Document Released: 01/15/2003 Document Revised: 04/06/2011 Document Reviewed: 06/11/2010 [facility] Patient Information 2014 [place], [place].

## 2013-01-23 NOTE — Progress Notes (Signed)
Subjective:     Patient ID: [name] Bell, female   DOB: 1945-05-17, 67 y.o.   MRN: [id number]  HPI   [name] Bell  May 14, 1945 [id number]  Patient Care Team: Linward Headland, MD as PCP - General (Internal Medicine) Petra Kuba, MD as Consulting Physician (Gastroenterology)  This patient is a 67 y.o.female who presents today for surgical evaluation.   POSTOPERATIVE DIAGNOSIS:  1. Pneumoperitoneum with peritonitis.  2. Distal sigmoid colonic perforation. Favor stercoral etiology.   PROCEDURE PERFORMED 07/2011:  1. Laparoscopic lysis of adhesions x30 minutes.  2. Laparoscopic drainage of pelvic abscesses.  3. Laparoscopically assisted rectosigmoid resection with end  colostomy.   Reason for visit: Followup from admission to hospital for colitis.  Patient with severe defecation and constipation problems.  Developed a perforation of colon that required laparoscopically assisted Hartmann procedure last year.  She prefers having the colostomy and avoiding straining on the commode for several hours at a time.  She had colitis a few months ago, but that improved bowel rest & antibiotics.  She is feeling stronger.  She is eating better.  No fevers or chills.   Energy level otherwise pretty good.  Remains on narcotics for her chronic pain issues but no major alterations.    She comes today over concerns of an irritated area just lateral to her colostomy.  There has been in a home health nurse [place] troubleshoot this.  No bleeding.  No leaking around the bag.  She feels like the area "tears"  No fever chills or sides.  Very sensitive at the skin.    Patient Active Problem List   Diagnosis Date Noted  . Diverticulitis of colon with perforation 07/30/2011    Priority: Medium  . Sinusitis 12/28/2012  . Otitis media of left ear 12/28/2012  . Shingles 12/28/2012  . Acute nonsuppurative otitis media of both ears  12/05/2012  . Acute colitis 09/29/2012  . Macular degeneration 01/14/2012  . Preventative health care 01/14/2012  . COPD (chronic obstructive pulmonary disease) 11/25/2011  . Colostomy in place 10/27/2011  . Physical deconditioning 08/26/2011  . Constipation 07/17/2010  . HYPERLIPIDEMIA 01/24/2009  . Chronic pain syndrome 01/03/2009  . DEPRESSION 03/08/2008  . TEMPOROMANDIBULAR JOINT PAIN 12/01/2007  . OSTEOPOROSIS 03/03/2007  . ACUTE DUODENAL ULCER W/HEMORRHAGE&OBSTRUCTION 12/28/2006  . TOBACCO ABUSE 12/02/2006  . VITAMIN B12 DEFICIENCY 11/03/2006    Past Medical History  Diagnosis Date  . Superior mesenteric artery syndrome 11/08    sp dilation by Dr. Ewing Schlein, Pt may require bowel resetion if sx recur  . Duodenal ulcer 11/08    With hemorrhage and obstruction  . Osteomyelitis, jaw acute 11/08    Started while in the hospital abcess showed GNR . SP debridement by Dr. Neoma Laming,  Ernesta Amble S Bovis sp 4  weeks of Pen V started on 05/19/07  with additional 2 weeks in 07/04/07  . Degenerative disk disease     This is interscapular, mild compression frx of T12 superior endplate with Schmorl's node. Seen on CT in 2/08  . Back pain   . Depression   . Acute sinusitis 06/30/2011  . Ischemic colitis 07/30/2011    2009 by endoscopy   . Perforated bowel     surgery july 2013    Past Surgical History  Procedure Laterality Date  . Abdominal hysterectomy    . Colostomy  07/30/2011    Procedure: COLOSTOMY;  Surgeon:  Ardeth Sportsman, MD;  Location: [facility] OR;  Service: General;  Laterality: Left;  . Lysis of adhesions  07/30/2011  . Sigmoid colectomy  07/30/2011  . Transrectal drainage of pelvic abscess  07/30/2011    History   Social History  . Marital Status: Divorced    Spouse Name: N/A    Number of Children: N/A  . Years of Education: N/A   Occupational History  . Not on file.   Social History Main Topics  . Smoking status: Former Smoker -- 0.20 packs/day for 20 years    Types: Cigarettes     Quit date: 05/26/2012  . Smokeless tobacco: Not on file     Comment: 2 cigs/day  . Alcohol Use: No  . Drug Use: No  . Sexual Activity: Not on file   Other Topics Concern  . Not on file   Social History Narrative   Pt now lives by herself in an ALF [facility]) where she can come and go as she pleases.  A good friend named [name] still looks out for her daily.     Family History  Problem Relation Age of Onset  . Cancer Mother   . Heart disease Father   . Diabetes Sister     Current Outpatient Prescriptions  Medication Sig Dispense Refill  . buPROPion (WELLBUTRIN) 100 MG tablet Take 100 mg by mouth 4 (four) times daily.      . calcium-vitamin D (OSCAL WITH D 500-200) 500-200 MG-UNIT per tablet Take 1 tablet by mouth 3 (three) times daily.        . cholecalciferol (VITAMIN D) 1000 UNITS tablet Take 1,000 Units by mouth 3 (three) times daily.      Marland Kitchen docusate sodium (COLACE) 100 MG capsule Take 100 mg by mouth daily as needed for constipation.       Marland Kitchen HYDROcodone-acetaminophen (NORCO) 7.5-325 MG per tablet Take 1 tablet by mouth every 6 (six) hours as needed for pain.  150 tablet  0  . methocarbamol (ROBAXIN) 750 MG tablet Take 1 tablet (750 mg total) by mouth 4 (four) times daily as needed.  120 tablet  3  . morphine (MS CONTIN) 15 MG 12 hr tablet Take 1 tablet (15 mg total) by mouth every 12 (twelve) hours.  60 tablet  0  . NEXIUM 40 MG capsule TAKE 1 CAPSULE BY MOUTH TWICE DAILY  180 capsule  3  . pravastatin (PRAVACHOL) 80 MG tablet Take 1 tablet (80 mg total) by mouth daily.  30 tablet  11  . promethazine (PHENERGAN) 12.5 MG tablet TAKE 1 TABLET BY MOUTH EVERY 6 HOURS AS NEEDED  90 tablet  1   No current facility-administered medications for this visit.     Allergies  Allergen Reactions  . Ibuprofen     REACTION: Bleeding ulcers  . Latex Itching  . Nsaids     REACTION: Bleeding Ulcer    BP 110/70  Pulse 120  Temp(Src) 98.1 F (36.7 C) (Temporal)  Resp 15  Ht 5\' 4"   (1.626 m)  Wt 134 lb 9.6 oz (61.054 kg)  BMI 23.09 kg/m2  LMP 08/25/1980  No results found.   Review of Systems  Constitutional: Negative for fever, chills, diaphoresis, appetite change and fatigue.  HENT: Negative for ear discharge, ear pain, sore throat and trouble swallowing.   Eyes: Negative for photophobia, discharge and visual disturbance.  Respiratory: Negative for cough, choking, chest tightness and shortness of breath.   Cardiovascular: Negative for chest pain and  palpitations.  Gastrointestinal: Positive for constipation. Negative for nausea, vomiting, diarrhea, abdominal distention, anal bleeding and rectal pain.  Endocrine: Negative for cold intolerance and heat intolerance.  Genitourinary: Negative for dysuria, frequency and difficulty urinating.  Musculoskeletal: Negative for gait problem, myalgias and neck pain.  Skin: Negative for color change, pallor and rash.  Allergic/Immunologic: Negative for environmental allergies, food allergies and immunocompromised state.  Neurological: Negative for dizziness, speech difficulty, weakness and numbness.  Hematological: Negative for adenopathy.  Psychiatric/Behavioral: Negative for confusion and agitation. The patient is not nervous/anxious.        Objective:   Physical Exam  Constitutional: She is oriented to person, place, and time. She appears well-developed and well-nourished. No distress.  Moves slowly with chronic joint and abdominal/trunk/back pain  HENT:  Head: Normocephalic.  Mouth/Throat: Oropharynx is clear and moist. No oropharyngeal exudate.  Eyes: Conjunctivae and EOM are normal. Pupils are equal, round, and reactive to light. No scleral icterus.  Neck: Normal range of motion. Neck supple. No tracheal deviation present.  Cardiovascular: Normal rate, regular rhythm and intact distal pulses.   Pulmonary/Chest: Effort normal and breath sounds normal. No stridor. No respiratory distress. She exhibits no tenderness.    Abdominal: Soft. She exhibits no distension and no mass. There is no tenderness. Hernia confirmed negative in the right inguinal area and confirmed negative in the left inguinal area.    Genitourinary: No vaginal discharge found.  Musculoskeletal: Normal range of motion. She exhibits no tenderness.       Right elbow: She exhibits normal range of motion.       Left elbow: She exhibits normal range of motion.       Right wrist: She exhibits normal range of motion.       Left wrist: She exhibits normal range of motion.       Right hand: Normal strength noted.       Left hand: Normal strength noted.  Lymphadenopathy:       Head (right side): No posterior auricular adenopathy present.       Head (left side): No posterior auricular adenopathy present.    She has no cervical adenopathy.    She has no axillary adenopathy.       Right: No inguinal adenopathy present.       Left: No inguinal adenopathy present.  Neurological: She is alert and oriented to person, place, and time. No cranial nerve deficit. She exhibits normal muscle tone. Coordination normal.  Skin: Skin is warm and dry. No rash noted. She is not diaphoretic. No erythema.  Psychiatric: Her speech is normal and behavior is normal. Judgment and thought content normal. Her affect is not angry. She is not agitated. She does not exhibit a depressed mood.  Mildly odd affect.  Tends to avoid eye contact. Pleasant, Joking with [place] at the end of the visit       Assessment:     Suspect mild skin breakdown at the border of the mucosa and dermis.      Plan:     I would recommend she get a wound ostomy nurse consultation.  We called [facility] to set appt with Page Spiro, wound ostomy consult nurse, in next couple weeks.  Maybe she needs a new wafer or a different skin care regimen to avoid episodes of maceration/blistering/breakdown.  If it is otherwise concerning, she can come back to be reevaluated.  I held off on changing  the stoma appliance today and she had some but  not all of her supplies.  Mild parastomal hernia at best.  Not causing obstruction.  No problem with worsening pain, swelling, leaking around the bag.  Hold off on any surgery.  Patient prefers colostomy to severe defecation difficulty.  I will not interfere with this.  If worsening pain or bleeding, consider repeat colonoscopy.  Hold off for now  Diet as tolerated.  Low fat high fiber diet ideal.  Bowel regimen with 30 g fiber a day and fiber supplement as needed to avoid problems.  Increase bowel regimen with chronic constipation.  Consider weaning off narcotics as they are a contributor for further bowel problems.  Return to clinic as needed.   Instructions discussed.  Followup with primary care physician for other health issues as would normally be done.  Questions answered.  The patient expressed understanding and appreciation

## 2013-02-02 ENCOUNTER — Encounter: Payer: Self-pay | Admitting: Internal Medicine

## 2013-02-02 ENCOUNTER — Ambulatory Visit (INDEPENDENT_AMBULATORY_CARE_PROVIDER_SITE_OTHER): Payer: Medicare Other | Admitting: Internal Medicine

## 2013-02-02 VITALS — BP 106/65 | HR 118 | Temp 97.1°F | Ht 63.0 in | Wt 130.7 lb

## 2013-02-02 DIAGNOSIS — Z433 Encounter for attention to colostomy: Secondary | ICD-10-CM | POA: Diagnosis not present

## 2013-02-02 DIAGNOSIS — Z933 Colostomy status: Secondary | ICD-10-CM | POA: Diagnosis not present

## 2013-02-02 DIAGNOSIS — R6889 Other general symptoms and signs: Secondary | ICD-10-CM | POA: Diagnosis not present

## 2013-02-02 DIAGNOSIS — H3530 Unspecified macular degeneration: Secondary | ICD-10-CM | POA: Diagnosis not present

## 2013-02-02 DIAGNOSIS — H6690 Otitis media, unspecified, unspecified ear: Secondary | ICD-10-CM | POA: Diagnosis not present

## 2013-02-02 DIAGNOSIS — K9400 Colostomy complication, unspecified: Secondary | ICD-10-CM | POA: Insufficient documentation

## 2013-02-02 DIAGNOSIS — IMO0002 Reserved for concepts with insufficient information to code with codable children: Secondary | ICD-10-CM

## 2013-02-02 DIAGNOSIS — H65199 Other acute nonsuppurative otitis media, unspecified ear: Secondary | ICD-10-CM | POA: Diagnosis not present

## 2013-02-02 MED ORDER — GUAIFENESIN ER 600 MG PO TB12: 600.0000 mg | ORAL_TABLET | Freq: Two times a day (BID) | ORAL | Status: DC

## 2013-02-02 MED ORDER — OSELTAMIVIR PHOSPHATE 75 MG PO CAPS: 75.0000 mg | ORAL_CAPSULE | Freq: Two times a day (BID) | ORAL | Status: AC

## 2013-02-02 MED ORDER — DEXTROMETHORPHAN-GUAIFENESIN 10-100 MG/5ML PO LIQD: 5.0000 mL | ORAL | Status: DC | PRN

## 2013-02-02 NOTE — Assessment & Plan Note (Signed)
It appears pt has a wound 2/2 trauma regarding removal of ostomy bag. No signs of infection.  -wound care to visit 3x/wk for dressing changes

## 2013-02-02 NOTE — Patient Instructions (Signed)
It was nice to meet you today sorry you are not feeling well!  For your illness seems you have the flu:  -tamiflu -drink plenty of fluids -cough syrup and some mucus medication  Have a happy new year!

## 2013-02-02 NOTE — Assessment & Plan Note (Addendum)
Pt had sick contacts along with myalgias and arthralgias. Onset of symptoms within 24 hrs. Doesn't appear to have PE findings concerning for PNA or COPD exacerbation at this time.  -tamiflu -symptomatic management with tussin dm cough syrup and nasal spray

## 2013-02-02 NOTE — Progress Notes (Signed)
   Subjective:    Patient ID: [name] Bell, female    DOB: [birth year], 68 y.o.   MRN: [id number]  HPI [name] Bell is a 68 yo woman pmh as listed below presents with acute URI symptoms.   Pt states that symptoms began 24 hrs ago with chills and joint and muscle aches. Pt lives at a nursing home and many of her neighbors are sick with similar symptoms or are at different stages of recovering from illness. Patient has had some nasal congestion and feelings of air fullness. Had some night sweats yesterday. She does not feel increasingly short of breath or needed to use her inhalers more often. She has developed also a cough that is productive of clear sputum. She has noticed that whenever she has coughing episodes at times she becomes nauseous and also had an episode of vomiting that was nonbilious and no blood this morning. She has not noticed increased colostomy output during this time and has maintained a good appetite and is able to drink and keep up with adequate by mouth intake.   Review of Systems  Constitutional: Positive for chills and fatigue. Negative for fever and activity change.       Night sweats   HENT: Positive for congestion and postnasal drip. Negative for ear pain, rhinorrhea and sore throat.   Respiratory: Positive for cough (productive of clear sputum). Negative for shortness of breath and wheezing.   Cardiovascular: Negative for chest pain, palpitations and leg swelling.  Gastrointestinal: Positive for nausea and vomiting (accompanies coughing). Negative for abdominal pain, diarrhea and constipation.  Musculoskeletal: Positive for arthralgias and myalgias.  Skin: Positive for wound (around ostomy site). Negative for rash.  Neurological: Negative for dizziness and headaches.       Objective:   Physical Exam Filed Vitals:   02/02/13 1419  BP: 106/65  Pulse: 118  Temp: 97.1 F (36.2 C)   General: sitting in chair, NAD HEENT: PERRL, EOMI, no scleral icterus, clear  PND, clear fluid behind TM no pus or erythema Cardiac: RRR, no rubs, murmurs or gallops Pulm: clear to auscultation bilaterally, no wheezes, crackles, or rhonchi, moving normal volumes of air Abd: soft, nontender, nondistended, BS present, LLQ colostomy with good pink tissue draining stool with slight erythema under adhesive attachment and slight tear under bag Ext: warm and well perfused, no pedal edema Neuro: alert and oriented X3, cranial nerves II-XII grossly intact     Assessment & Plan:  Please see problem oriented charting.   Pt discussed with Dr. Stann Mainland

## 2013-02-05 DIAGNOSIS — F3289 Other specified depressive episodes: Secondary | ICD-10-CM | POA: Diagnosis not present

## 2013-02-05 DIAGNOSIS — D518 Other vitamin B12 deficiency anemias: Secondary | ICD-10-CM | POA: Diagnosis not present

## 2013-02-05 DIAGNOSIS — J441 Chronic obstructive pulmonary disease with (acute) exacerbation: Secondary | ICD-10-CM | POA: Diagnosis not present

## 2013-02-05 DIAGNOSIS — Z433 Encounter for attention to colostomy: Secondary | ICD-10-CM | POA: Diagnosis not present

## 2013-02-05 DIAGNOSIS — Z602 Problems related to living alone: Secondary | ICD-10-CM | POA: Diagnosis not present

## 2013-02-05 DIAGNOSIS — H3530 Unspecified macular degeneration: Secondary | ICD-10-CM | POA: Diagnosis not present

## 2013-02-07 NOTE — Progress Notes (Signed)
Case discussed with Dr. Sadek soon after the resident saw the patient.  We reviewed the resident's history and exam and pertinent patient test results.  I agree with the assessment, diagnosis, and plan of care documented in the resident's note. 

## 2013-02-22 ENCOUNTER — Other Ambulatory Visit: Payer: Self-pay | Admitting: Internal Medicine

## 2013-03-06 ENCOUNTER — Other Ambulatory Visit: Payer: Self-pay | Admitting: *Deleted

## 2013-03-06 MED ORDER — HYDROCODONE-ACETAMINOPHEN 7.5-325 MG PO TABS: 1.0000 | ORAL_TABLET | ORAL | Status: DC | PRN

## 2013-03-06 MED ORDER — MORPHINE SULFATE ER 15 MG PO TBCR: 15.0000 mg | EXTENDED_RELEASE_TABLET | Freq: Two times a day (BID) | ORAL | Status: DC

## 2013-03-06 NOTE — Telephone Encounter (Signed)
Refills printed and placed in overnight scripts box.  Pt may have all 3 scripts at once.

## 2013-03-07 NOTE — Telephone Encounter (Signed)
Pt informed Rx is ready 

## 2013-04-05 ENCOUNTER — Telehealth: Payer: Self-pay | Admitting: *Deleted

## 2013-04-05 NOTE — Telephone Encounter (Signed)
Pamala Hurry with [facility] [phone] -  called about continuing with  colostomy care with pt - order was given to  continue with colostomy care.  Hilda Blades Roanna Reaves RN 04/05/13 10:25AM

## 2013-04-06 DIAGNOSIS — Z433 Encounter for attention to colostomy: Secondary | ICD-10-CM | POA: Diagnosis not present

## 2013-04-06 DIAGNOSIS — F329 Major depressive disorder, single episode, unspecified: Secondary | ICD-10-CM | POA: Diagnosis not present

## 2013-04-06 DIAGNOSIS — F3289 Other specified depressive episodes: Secondary | ICD-10-CM | POA: Diagnosis not present

## 2013-04-06 DIAGNOSIS — L989 Disorder of the skin and subcutaneous tissue, unspecified: Secondary | ICD-10-CM | POA: Diagnosis not present

## 2013-04-06 DIAGNOSIS — J441 Chronic obstructive pulmonary disease with (acute) exacerbation: Secondary | ICD-10-CM | POA: Diagnosis not present

## 2013-04-06 DIAGNOSIS — Z602 Problems related to living alone: Secondary | ICD-10-CM | POA: Diagnosis not present

## 2013-04-06 DIAGNOSIS — H3530 Unspecified macular degeneration: Secondary | ICD-10-CM | POA: Diagnosis not present

## 2013-04-06 DIAGNOSIS — D518 Other vitamin B12 deficiency anemias: Secondary | ICD-10-CM | POA: Diagnosis not present

## 2013-04-09 ENCOUNTER — Other Ambulatory Visit: Payer: Self-pay | Admitting: Internal Medicine

## 2013-04-10 DIAGNOSIS — F329 Major depressive disorder, single episode, unspecified: Secondary | ICD-10-CM | POA: Diagnosis not present

## 2013-04-10 DIAGNOSIS — F3289 Other specified depressive episodes: Secondary | ICD-10-CM | POA: Diagnosis not present

## 2013-04-10 DIAGNOSIS — L989 Disorder of the skin and subcutaneous tissue, unspecified: Secondary | ICD-10-CM | POA: Diagnosis not present

## 2013-04-10 DIAGNOSIS — J441 Chronic obstructive pulmonary disease with (acute) exacerbation: Secondary | ICD-10-CM | POA: Diagnosis not present

## 2013-04-10 DIAGNOSIS — Z433 Encounter for attention to colostomy: Secondary | ICD-10-CM | POA: Diagnosis not present

## 2013-04-10 DIAGNOSIS — D518 Other vitamin B12 deficiency anemias: Secondary | ICD-10-CM | POA: Diagnosis not present

## 2013-04-10 DIAGNOSIS — H3530 Unspecified macular degeneration: Secondary | ICD-10-CM | POA: Diagnosis not present

## 2013-04-18 DIAGNOSIS — J441 Chronic obstructive pulmonary disease with (acute) exacerbation: Secondary | ICD-10-CM | POA: Diagnosis not present

## 2013-04-18 DIAGNOSIS — Z433 Encounter for attention to colostomy: Secondary | ICD-10-CM | POA: Diagnosis not present

## 2013-04-18 DIAGNOSIS — D518 Other vitamin B12 deficiency anemias: Secondary | ICD-10-CM | POA: Diagnosis not present

## 2013-04-18 DIAGNOSIS — H3530 Unspecified macular degeneration: Secondary | ICD-10-CM | POA: Diagnosis not present

## 2013-04-18 DIAGNOSIS — F3289 Other specified depressive episodes: Secondary | ICD-10-CM | POA: Diagnosis not present

## 2013-04-18 DIAGNOSIS — L989 Disorder of the skin and subcutaneous tissue, unspecified: Secondary | ICD-10-CM | POA: Diagnosis not present

## 2013-04-18 DIAGNOSIS — F329 Major depressive disorder, single episode, unspecified: Secondary | ICD-10-CM | POA: Diagnosis not present

## 2013-04-25 ENCOUNTER — Telehealth: Payer: Self-pay | Admitting: *Deleted

## 2013-04-25 DIAGNOSIS — Z433 Encounter for attention to colostomy: Secondary | ICD-10-CM | POA: Diagnosis not present

## 2013-04-25 DIAGNOSIS — D518 Other vitamin B12 deficiency anemias: Secondary | ICD-10-CM | POA: Diagnosis not present

## 2013-04-25 DIAGNOSIS — F329 Major depressive disorder, single episode, unspecified: Secondary | ICD-10-CM | POA: Diagnosis not present

## 2013-04-25 DIAGNOSIS — J441 Chronic obstructive pulmonary disease with (acute) exacerbation: Secondary | ICD-10-CM | POA: Diagnosis not present

## 2013-04-25 DIAGNOSIS — L989 Disorder of the skin and subcutaneous tissue, unspecified: Secondary | ICD-10-CM | POA: Diagnosis not present

## 2013-04-25 DIAGNOSIS — H3530 Unspecified macular degeneration: Secondary | ICD-10-CM | POA: Diagnosis not present

## 2013-04-25 DIAGNOSIS — F3289 Other specified depressive episodes: Secondary | ICD-10-CM | POA: Diagnosis not present

## 2013-04-25 NOTE — Telephone Encounter (Signed)
Call from [facility] with [facility] [phone]  Nurse is asking for an order for Social Worker to help pt with resources.   I can call in the Verbal Order if you like.

## 2013-04-26 NOTE — Telephone Encounter (Signed)
Order given to RN

## 2013-04-26 NOTE — Telephone Encounter (Signed)
I agree with this order, please phone in.  Thank you.

## 2013-05-01 DIAGNOSIS — H3530 Unspecified macular degeneration: Secondary | ICD-10-CM | POA: Diagnosis not present

## 2013-05-01 DIAGNOSIS — Z433 Encounter for attention to colostomy: Secondary | ICD-10-CM | POA: Diagnosis not present

## 2013-05-01 DIAGNOSIS — F3289 Other specified depressive episodes: Secondary | ICD-10-CM | POA: Diagnosis not present

## 2013-05-01 DIAGNOSIS — F329 Major depressive disorder, single episode, unspecified: Secondary | ICD-10-CM | POA: Diagnosis not present

## 2013-05-01 DIAGNOSIS — L989 Disorder of the skin and subcutaneous tissue, unspecified: Secondary | ICD-10-CM | POA: Diagnosis not present

## 2013-05-01 DIAGNOSIS — J441 Chronic obstructive pulmonary disease with (acute) exacerbation: Secondary | ICD-10-CM | POA: Diagnosis not present

## 2013-05-01 DIAGNOSIS — D518 Other vitamin B12 deficiency anemias: Secondary | ICD-10-CM | POA: Diagnosis not present

## 2013-05-03 DIAGNOSIS — D518 Other vitamin B12 deficiency anemias: Secondary | ICD-10-CM | POA: Diagnosis not present

## 2013-05-03 DIAGNOSIS — H3530 Unspecified macular degeneration: Secondary | ICD-10-CM | POA: Diagnosis not present

## 2013-05-03 DIAGNOSIS — L989 Disorder of the skin and subcutaneous tissue, unspecified: Secondary | ICD-10-CM | POA: Diagnosis not present

## 2013-05-03 DIAGNOSIS — F3289 Other specified depressive episodes: Secondary | ICD-10-CM | POA: Diagnosis not present

## 2013-05-03 DIAGNOSIS — Z433 Encounter for attention to colostomy: Secondary | ICD-10-CM | POA: Diagnosis not present

## 2013-05-03 DIAGNOSIS — J441 Chronic obstructive pulmonary disease with (acute) exacerbation: Secondary | ICD-10-CM | POA: Diagnosis not present

## 2013-05-03 DIAGNOSIS — F329 Major depressive disorder, single episode, unspecified: Secondary | ICD-10-CM | POA: Diagnosis not present

## 2013-05-05 ENCOUNTER — Other Ambulatory Visit: Payer: Self-pay | Admitting: Internal Medicine

## 2013-05-09 DIAGNOSIS — F3289 Other specified depressive episodes: Secondary | ICD-10-CM | POA: Diagnosis not present

## 2013-05-09 DIAGNOSIS — D518 Other vitamin B12 deficiency anemias: Secondary | ICD-10-CM | POA: Diagnosis not present

## 2013-05-09 DIAGNOSIS — L989 Disorder of the skin and subcutaneous tissue, unspecified: Secondary | ICD-10-CM | POA: Diagnosis not present

## 2013-05-09 DIAGNOSIS — H3530 Unspecified macular degeneration: Secondary | ICD-10-CM | POA: Diagnosis not present

## 2013-05-09 DIAGNOSIS — J441 Chronic obstructive pulmonary disease with (acute) exacerbation: Secondary | ICD-10-CM | POA: Diagnosis not present

## 2013-05-09 DIAGNOSIS — F329 Major depressive disorder, single episode, unspecified: Secondary | ICD-10-CM | POA: Diagnosis not present

## 2013-05-09 DIAGNOSIS — Z433 Encounter for attention to colostomy: Secondary | ICD-10-CM | POA: Diagnosis not present

## 2013-05-15 DIAGNOSIS — L989 Disorder of the skin and subcutaneous tissue, unspecified: Secondary | ICD-10-CM | POA: Diagnosis not present

## 2013-05-15 DIAGNOSIS — Z433 Encounter for attention to colostomy: Secondary | ICD-10-CM | POA: Diagnosis not present

## 2013-05-15 DIAGNOSIS — F329 Major depressive disorder, single episode, unspecified: Secondary | ICD-10-CM | POA: Diagnosis not present

## 2013-05-15 DIAGNOSIS — F3289 Other specified depressive episodes: Secondary | ICD-10-CM | POA: Diagnosis not present

## 2013-05-15 DIAGNOSIS — J441 Chronic obstructive pulmonary disease with (acute) exacerbation: Secondary | ICD-10-CM | POA: Diagnosis not present

## 2013-05-15 DIAGNOSIS — D518 Other vitamin B12 deficiency anemias: Secondary | ICD-10-CM | POA: Diagnosis not present

## 2013-05-15 DIAGNOSIS — H3530 Unspecified macular degeneration: Secondary | ICD-10-CM | POA: Diagnosis not present

## 2013-05-16 DIAGNOSIS — H1045 Other chronic allergic conjunctivitis: Secondary | ICD-10-CM | POA: Diagnosis not present

## 2013-05-16 DIAGNOSIS — H04129 Dry eye syndrome of unspecified lacrimal gland: Secondary | ICD-10-CM | POA: Diagnosis not present

## 2013-05-16 DIAGNOSIS — Z961 Presence of intraocular lens: Secondary | ICD-10-CM | POA: Diagnosis not present

## 2013-05-16 DIAGNOSIS — H353290 Exudative age-related macular degeneration, unspecified eye, stage unspecified: Secondary | ICD-10-CM | POA: Diagnosis not present

## 2013-05-23 DIAGNOSIS — Z433 Encounter for attention to colostomy: Secondary | ICD-10-CM | POA: Diagnosis not present

## 2013-05-23 DIAGNOSIS — D518 Other vitamin B12 deficiency anemias: Secondary | ICD-10-CM | POA: Diagnosis not present

## 2013-05-23 DIAGNOSIS — J441 Chronic obstructive pulmonary disease with (acute) exacerbation: Secondary | ICD-10-CM | POA: Diagnosis not present

## 2013-05-23 DIAGNOSIS — F329 Major depressive disorder, single episode, unspecified: Secondary | ICD-10-CM | POA: Diagnosis not present

## 2013-05-23 DIAGNOSIS — L989 Disorder of the skin and subcutaneous tissue, unspecified: Secondary | ICD-10-CM | POA: Diagnosis not present

## 2013-05-23 DIAGNOSIS — H3530 Unspecified macular degeneration: Secondary | ICD-10-CM | POA: Diagnosis not present

## 2013-05-23 DIAGNOSIS — F3289 Other specified depressive episodes: Secondary | ICD-10-CM | POA: Diagnosis not present

## 2013-05-24 ENCOUNTER — Telehealth: Payer: Self-pay | Admitting: Licensed Clinical Social Worker

## 2013-05-24 NOTE — Telephone Encounter (Signed)
CSW returned call to [place] [facility] currently active with Ms. Faulks.  Pt's eye sight is deteriorating and pt in need of PCS.  CSW notified Jenny Reichmann, pt has not been seen within the last 90 days and will need appt.  Cindy aware and will notify pt.

## 2013-05-29 DIAGNOSIS — Z433 Encounter for attention to colostomy: Secondary | ICD-10-CM | POA: Diagnosis not present

## 2013-05-29 DIAGNOSIS — F3289 Other specified depressive episodes: Secondary | ICD-10-CM | POA: Diagnosis not present

## 2013-05-29 DIAGNOSIS — H3530 Unspecified macular degeneration: Secondary | ICD-10-CM | POA: Diagnosis not present

## 2013-05-29 DIAGNOSIS — D518 Other vitamin B12 deficiency anemias: Secondary | ICD-10-CM | POA: Diagnosis not present

## 2013-05-29 DIAGNOSIS — L989 Disorder of the skin and subcutaneous tissue, unspecified: Secondary | ICD-10-CM | POA: Diagnosis not present

## 2013-05-29 DIAGNOSIS — J441 Chronic obstructive pulmonary disease with (acute) exacerbation: Secondary | ICD-10-CM | POA: Diagnosis not present

## 2013-05-29 DIAGNOSIS — F329 Major depressive disorder, single episode, unspecified: Secondary | ICD-10-CM | POA: Diagnosis not present

## 2013-05-31 ENCOUNTER — Ambulatory Visit (INDEPENDENT_AMBULATORY_CARE_PROVIDER_SITE_OTHER): Payer: Medicare Other | Admitting: Internal Medicine

## 2013-05-31 ENCOUNTER — Encounter: Payer: Self-pay | Admitting: Internal Medicine

## 2013-05-31 VITALS — BP 126/60 | HR 83 | Temp 97.8°F | Ht 64.0 in | Wt 144.6 lb

## 2013-05-31 DIAGNOSIS — Z933 Colostomy status: Secondary | ICD-10-CM

## 2013-05-31 DIAGNOSIS — J329 Chronic sinusitis, unspecified: Secondary | ICD-10-CM | POA: Diagnosis not present

## 2013-05-31 DIAGNOSIS — G894 Chronic pain syndrome: Secondary | ICD-10-CM | POA: Diagnosis not present

## 2013-05-31 DIAGNOSIS — H3530 Unspecified macular degeneration: Secondary | ICD-10-CM

## 2013-05-31 DIAGNOSIS — Z0000 Encounter for general adult medical examination without abnormal findings: Secondary | ICD-10-CM | POA: Diagnosis not present

## 2013-05-31 DIAGNOSIS — B029 Zoster without complications: Secondary | ICD-10-CM | POA: Diagnosis not present

## 2013-05-31 DIAGNOSIS — F17200 Nicotine dependence, unspecified, uncomplicated: Secondary | ICD-10-CM | POA: Diagnosis not present

## 2013-05-31 DIAGNOSIS — H6690 Otitis media, unspecified, unspecified ear: Secondary | ICD-10-CM | POA: Diagnosis not present

## 2013-05-31 MED ORDER — HYDROCODONE-ACETAMINOPHEN 7.5-325 MG PO TABS: 1.0000 | ORAL_TABLET | ORAL | Status: DC | PRN

## 2013-05-31 MED ORDER — MORPHINE SULFATE ER 15 MG PO TBCR: 15.0000 mg | EXTENDED_RELEASE_TABLET | Freq: Two times a day (BID) | ORAL | Status: DC

## 2013-05-31 MED ORDER — MOMETASONE FUROATE 50 MCG/ACT NA SUSP: 2.0000 | Freq: Every day | NASAL | Status: DC

## 2013-05-31 MED ORDER — BUPROPION HCL 100 MG PO TABS: 100.0000 mg | ORAL_TABLET | Freq: Four times a day (QID) | ORAL | Status: DC

## 2013-05-31 NOTE — Assessment & Plan Note (Signed)
The patient's eyesight has unfortunately significantly deteriorated, despite what sounds like excellent treatment by her Ophthalmologists.  The patient's vision is now too poor to complete a bedside visual acuity test. -signed paperwork for PCS -pt has started looking into obtaining services for the blind, for which I believe she would greatly benefit

## 2013-05-31 NOTE — Patient Instructions (Addendum)
General Instructions: For your vision loss, continue to follow-up with your Ophthalmologists as scheduled.  We have filled out paperwork today to try to assist with obtaining PCS.  We have refilled your MS Contin and Norco medications today.  We are unable to increase these medications, due to the adverse effects these medications have on your digestive system  Please return for a follow-up visit in 4 months.  Treatment Goals:  Goals (1 Years of Data) as of 05/31/13   None      Progress Toward Treatment Goals:  Treatment Goal 05/11/2012  Stop smoking smoking the same amount    Self Care Goals & Plans:  Self Care Goal 05/11/2012  Manage my medications take my medicines as prescribed; bring my medications to every visit; refill my medications on time  Eat healthy foods drink diet soda or water instead of juice or soda  Be physically active find an activity I enjoy  Stop smoking cut down the number of cigarettes smoked    No flowsheet data found.   Care Management & Community Referrals:  Referral 05/31/2013  Referrals made for care management support -  Referrals made to community resources none

## 2013-05-31 NOTE — Assessment & Plan Note (Signed)
The patient continues to note chronic back and peri-ostomy pain.  I believe increasing her narcotics could worsen constipation and peri-ostomy issues. -continue current dose of norco and MS contin, refills given today -discussed setting appropriate pain control goals

## 2013-05-31 NOTE — Progress Notes (Signed)
HPI The patient is a 68 y.o. female with a history of depression, chronic pain, macular degeneration, presenting for a follow-up visit for visual problems.  The patient has a history of macular degeneration, and notes that her eyesight has gradually deteriorated over the last 2 years to the point where she even has trouble seeing her abdomen to do her ostomy care.  The patient has followed with Dr. Katy Fitch, last visit 4/21, who noted that her vision had significantly worsened since the year prior.  The patient has been receiving ocular injections from Dr. Tye Savoy and Celesta Aver.  The patient is pursuing assistance from [facility] Blind.  The patient has been having some difficulties with irritation around her ostomy.  She has an ostomy nurse who comes to her house to help with this 3 times per week, which has been very helpful to her  The patient has a history of chronic pain.  She takes Norco 7.5-325, 5 tabs/day, but finds herself using additional pills some times, running out early.  She also uses MS Contin, 15 mg, BID.  She notes taking adjuvant goody powders 4-6/day at the end of the month to help with her pain.  ROS: General: no fevers, chills, changes in weight, changes in appetite Skin: no rash HEENT: +vision loss, no hearing changes, sore throat Pulm: no dyspnea, coughing, wheezing CV: no chest pain, palpitations, shortness of breath Abd: +ostomy in place GU: no dysuria, hematuria, polyuria Ext: no arthralgias, myalgias Neuro: no weakness, numbness, or tingling  Filed Vitals:   05/31/13 1347  BP: 126/60  Pulse: 83  Temp: 97.8 F (36.6 C)    PEX General: alert, cooperative, appears to have trouble seeing me from across the room HEENT: PERRL, pt unable to see letters for vision test, oropharynx non-erythematous Neck: supple Lungs: clear to ascultation bilaterally, normal work of respiration, no wheezes, rales, ronchi Heart: regular rate and rhythm, no murmurs, gallops, or  rubs Abdomen: soft, non-tender, non-distended, normal bowel sounds Extremities: no cyanosis, clubbing, or edema Neurologic: alert & oriented X3, cranial nerves II-XII intact, strength grossly intact, sensation intact to light touch  Current Outpatient Prescriptions on File Prior to Visit  Medication Sig Dispense Refill  . buPROPion (WELLBUTRIN) 100 MG tablet Take 100 mg by mouth 4 (four) times daily.      Marland Kitchen buPROPion (WELLBUTRIN) 100 MG tablet TAKE 1 TABLET BY MOUTH THREE TIMES DAILY  90 tablet  0  . calcium-vitamin D (OSCAL WITH D 500-200) 500-200 MG-UNIT per tablet Take 1 tablet by mouth 3 (three) times daily.        . cholecalciferol (VITAMIN D) 1000 UNITS tablet Take 1,000 Units by mouth 3 (three) times daily.      Marland Kitchen dextromethorphan-guaiFENesin (TUSSIN DM) 10-100 MG/5ML liquid Take 5 mLs by mouth every 4 (four) hours as needed for cough.  180 mL  0  . docusate sodium (COLACE) 100 MG capsule Take 100 mg by mouth daily as needed for constipation.       Marland Kitchen guaiFENesin (MUCINEX) 600 MG 12 hr tablet Take 1 tablet (600 mg total) by mouth 2 (two) times daily.  60 tablet  2  . HYDROcodone-acetaminophen (NORCO) 7.5-325 MG per tablet Take 1 tablet by mouth every 4 (four) hours as needed.  150 tablet  0  . methocarbamol (ROBAXIN) 750 MG tablet TAKE 1 TABLET BY MOUTH FOUR TIMES DAILY AS NEEDED  120 tablet  2  . morphine (MS CONTIN) 15 MG 12 hr tablet Take 1 tablet (15  mg total) by mouth every 12 (twelve) hours.  60 tablet  0  . NEXIUM 40 MG capsule TAKE 1 CAPSULE BY MOUTH TWICE DAILY  180 capsule  3  . pravastatin (PRAVACHOL) 80 MG tablet Take 1 tablet (80 mg total) by mouth daily.  30 tablet  11  . promethazine (PHENERGAN) 12.5 MG tablet TAKE 1 TABLET BY MOUTH EVERY 6 HOURS AS NEEDED  90 tablet  0   No current facility-administered medications on file prior to visit.    Assessment/Plan

## 2013-05-31 NOTE — Assessment & Plan Note (Signed)
The patient continues to have ongoing difficulties with ostomy care, though no acute issues at today's visit. -continue home ostomy nurse visits -follow-up with general surgery

## 2013-06-01 DIAGNOSIS — H3530 Unspecified macular degeneration: Secondary | ICD-10-CM | POA: Diagnosis not present

## 2013-06-01 DIAGNOSIS — J441 Chronic obstructive pulmonary disease with (acute) exacerbation: Secondary | ICD-10-CM | POA: Diagnosis not present

## 2013-06-01 DIAGNOSIS — F329 Major depressive disorder, single episode, unspecified: Secondary | ICD-10-CM | POA: Diagnosis not present

## 2013-06-01 DIAGNOSIS — D518 Other vitamin B12 deficiency anemias: Secondary | ICD-10-CM | POA: Diagnosis not present

## 2013-06-01 DIAGNOSIS — L989 Disorder of the skin and subcutaneous tissue, unspecified: Secondary | ICD-10-CM | POA: Diagnosis not present

## 2013-06-01 DIAGNOSIS — Z433 Encounter for attention to colostomy: Secondary | ICD-10-CM | POA: Diagnosis not present

## 2013-06-01 DIAGNOSIS — F3289 Other specified depressive episodes: Secondary | ICD-10-CM | POA: Diagnosis not present

## 2013-06-01 NOTE — Progress Notes (Signed)
Case discussed with Dr. Brown at the time of the visit.  We reviewed the resident's history and exam and pertinent patient test results.  I agree with the assessment, diagnosis, and plan of care documented in the resident's note. 

## 2013-06-05 DIAGNOSIS — F329 Major depressive disorder, single episode, unspecified: Secondary | ICD-10-CM | POA: Diagnosis not present

## 2013-06-05 DIAGNOSIS — D518 Other vitamin B12 deficiency anemias: Secondary | ICD-10-CM | POA: Diagnosis not present

## 2013-06-05 DIAGNOSIS — H3530 Unspecified macular degeneration: Secondary | ICD-10-CM | POA: Diagnosis not present

## 2013-06-05 DIAGNOSIS — J441 Chronic obstructive pulmonary disease with (acute) exacerbation: Secondary | ICD-10-CM | POA: Diagnosis not present

## 2013-06-05 DIAGNOSIS — Z433 Encounter for attention to colostomy: Secondary | ICD-10-CM | POA: Diagnosis not present

## 2013-06-05 DIAGNOSIS — Z602 Problems related to living alone: Secondary | ICD-10-CM | POA: Diagnosis not present

## 2013-06-05 DIAGNOSIS — F3289 Other specified depressive episodes: Secondary | ICD-10-CM | POA: Diagnosis not present

## 2013-06-05 DIAGNOSIS — L989 Disorder of the skin and subcutaneous tissue, unspecified: Secondary | ICD-10-CM | POA: Diagnosis not present

## 2013-06-05 NOTE — Telephone Encounter (Signed)
Was seen in clinic 05/31/13 Dr Owens Shark.

## 2013-06-12 ENCOUNTER — Other Ambulatory Visit: Payer: Self-pay | Admitting: Internal Medicine

## 2013-06-12 DIAGNOSIS — J441 Chronic obstructive pulmonary disease with (acute) exacerbation: Secondary | ICD-10-CM | POA: Diagnosis not present

## 2013-06-12 DIAGNOSIS — L989 Disorder of the skin and subcutaneous tissue, unspecified: Secondary | ICD-10-CM | POA: Diagnosis not present

## 2013-06-12 DIAGNOSIS — D518 Other vitamin B12 deficiency anemias: Secondary | ICD-10-CM | POA: Diagnosis not present

## 2013-06-12 DIAGNOSIS — Z433 Encounter for attention to colostomy: Secondary | ICD-10-CM | POA: Diagnosis not present

## 2013-06-12 DIAGNOSIS — F329 Major depressive disorder, single episode, unspecified: Secondary | ICD-10-CM | POA: Diagnosis not present

## 2013-06-12 DIAGNOSIS — H3530 Unspecified macular degeneration: Secondary | ICD-10-CM | POA: Diagnosis not present

## 2013-06-12 DIAGNOSIS — F3289 Other specified depressive episodes: Secondary | ICD-10-CM | POA: Diagnosis not present

## 2013-06-16 ENCOUNTER — Telehealth: Payer: Self-pay | Admitting: *Deleted

## 2013-06-16 NOTE — Telephone Encounter (Signed)
Pt's insurance will not pay for methocarbamol, contacted pharmacy to see if insurance will pay for flexeril and it will not. Pharmacy stated that reject message stated to try Baclofen or Tizanidine.  Will forward info to MD to for review.  Please advise.[name] Lanphere C Goldston5/22/20154:57 PM

## 2013-06-19 DIAGNOSIS — F3289 Other specified depressive episodes: Secondary | ICD-10-CM | POA: Diagnosis not present

## 2013-06-19 DIAGNOSIS — J441 Chronic obstructive pulmonary disease with (acute) exacerbation: Secondary | ICD-10-CM | POA: Diagnosis not present

## 2013-06-19 DIAGNOSIS — L989 Disorder of the skin and subcutaneous tissue, unspecified: Secondary | ICD-10-CM | POA: Diagnosis not present

## 2013-06-19 DIAGNOSIS — Z433 Encounter for attention to colostomy: Secondary | ICD-10-CM | POA: Diagnosis not present

## 2013-06-19 DIAGNOSIS — H3530 Unspecified macular degeneration: Secondary | ICD-10-CM | POA: Diagnosis not present

## 2013-06-19 DIAGNOSIS — F329 Major depressive disorder, single episode, unspecified: Secondary | ICD-10-CM | POA: Diagnosis not present

## 2013-06-19 DIAGNOSIS — D518 Other vitamin B12 deficiency anemias: Secondary | ICD-10-CM | POA: Diagnosis not present

## 2013-06-20 MED ORDER — BACLOFEN 5 MG HALF TABLET: 5.0000 mg | ORAL_TABLET | Freq: Three times a day (TID) | ORAL | Status: DC | PRN

## 2013-06-20 NOTE — Telephone Encounter (Signed)
Thank you for following up on this.  We can prescribe Baclofen, 5 mg TID prn.  I've called this medication in to the pharmacy this morning, and I've also called the patient to inform her of this medication change.  Thanks again.

## 2013-06-21 ENCOUNTER — Telehealth: Payer: Self-pay | Admitting: *Deleted

## 2013-06-21 NOTE — Telephone Encounter (Signed)
Pt called left message that there was nothing at pharm in order of muscle relaxant, spoke w/ dr brown, called pharm, they did not receive the electronic order, gave verbally, pharm will call pt

## 2013-06-22 DIAGNOSIS — J441 Chronic obstructive pulmonary disease with (acute) exacerbation: Secondary | ICD-10-CM | POA: Diagnosis not present

## 2013-06-22 DIAGNOSIS — H3530 Unspecified macular degeneration: Secondary | ICD-10-CM | POA: Diagnosis not present

## 2013-06-22 DIAGNOSIS — F3289 Other specified depressive episodes: Secondary | ICD-10-CM | POA: Diagnosis not present

## 2013-06-22 DIAGNOSIS — Z433 Encounter for attention to colostomy: Secondary | ICD-10-CM | POA: Diagnosis not present

## 2013-06-22 DIAGNOSIS — D518 Other vitamin B12 deficiency anemias: Secondary | ICD-10-CM | POA: Diagnosis not present

## 2013-06-22 DIAGNOSIS — L989 Disorder of the skin and subcutaneous tissue, unspecified: Secondary | ICD-10-CM | POA: Diagnosis not present

## 2013-06-22 DIAGNOSIS — F329 Major depressive disorder, single episode, unspecified: Secondary | ICD-10-CM | POA: Diagnosis not present

## 2013-06-26 DIAGNOSIS — H3530 Unspecified macular degeneration: Secondary | ICD-10-CM | POA: Diagnosis not present

## 2013-06-26 DIAGNOSIS — J441 Chronic obstructive pulmonary disease with (acute) exacerbation: Secondary | ICD-10-CM | POA: Diagnosis not present

## 2013-06-26 DIAGNOSIS — F3289 Other specified depressive episodes: Secondary | ICD-10-CM | POA: Diagnosis not present

## 2013-06-26 DIAGNOSIS — D518 Other vitamin B12 deficiency anemias: Secondary | ICD-10-CM | POA: Diagnosis not present

## 2013-06-26 DIAGNOSIS — L989 Disorder of the skin and subcutaneous tissue, unspecified: Secondary | ICD-10-CM | POA: Diagnosis not present

## 2013-06-26 DIAGNOSIS — Z433 Encounter for attention to colostomy: Secondary | ICD-10-CM | POA: Diagnosis not present

## 2013-06-26 DIAGNOSIS — F329 Major depressive disorder, single episode, unspecified: Secondary | ICD-10-CM | POA: Diagnosis not present

## 2013-06-29 ENCOUNTER — Telehealth: Payer: Self-pay | Admitting: *Deleted

## 2013-06-29 MED ORDER — IPRATROPIUM-ALBUTEROL 18-103 MCG/ACT IN AERO: 2.0000 | INHALATION_SPRAY | RESPIRATORY_TRACT | Status: DC | PRN

## 2013-06-29 NOTE — Telephone Encounter (Signed)
Call from pt - requesting a refill on Combivent inhaler,  she takes 1 puff 2 times a day. States she has allergies, some wheezing, no fever. Not on current list but on prior med list. Thanks

## 2013-06-29 NOTE — Telephone Encounter (Signed)
Refill sent to pharmacy.   

## 2013-07-03 DIAGNOSIS — H3530 Unspecified macular degeneration: Secondary | ICD-10-CM | POA: Diagnosis not present

## 2013-07-03 DIAGNOSIS — F329 Major depressive disorder, single episode, unspecified: Secondary | ICD-10-CM | POA: Diagnosis not present

## 2013-07-03 DIAGNOSIS — J441 Chronic obstructive pulmonary disease with (acute) exacerbation: Secondary | ICD-10-CM | POA: Diagnosis not present

## 2013-07-03 DIAGNOSIS — F3289 Other specified depressive episodes: Secondary | ICD-10-CM | POA: Diagnosis not present

## 2013-07-03 DIAGNOSIS — L989 Disorder of the skin and subcutaneous tissue, unspecified: Secondary | ICD-10-CM | POA: Diagnosis not present

## 2013-07-03 DIAGNOSIS — Z433 Encounter for attention to colostomy: Secondary | ICD-10-CM | POA: Diagnosis not present

## 2013-07-03 DIAGNOSIS — D518 Other vitamin B12 deficiency anemias: Secondary | ICD-10-CM | POA: Diagnosis not present

## 2013-07-10 DIAGNOSIS — L989 Disorder of the skin and subcutaneous tissue, unspecified: Secondary | ICD-10-CM | POA: Diagnosis not present

## 2013-07-10 DIAGNOSIS — J441 Chronic obstructive pulmonary disease with (acute) exacerbation: Secondary | ICD-10-CM | POA: Diagnosis not present

## 2013-07-10 DIAGNOSIS — H3530 Unspecified macular degeneration: Secondary | ICD-10-CM | POA: Diagnosis not present

## 2013-07-10 DIAGNOSIS — F329 Major depressive disorder, single episode, unspecified: Secondary | ICD-10-CM | POA: Diagnosis not present

## 2013-07-10 DIAGNOSIS — D518 Other vitamin B12 deficiency anemias: Secondary | ICD-10-CM | POA: Diagnosis not present

## 2013-07-10 DIAGNOSIS — Z433 Encounter for attention to colostomy: Secondary | ICD-10-CM | POA: Diagnosis not present

## 2013-07-10 DIAGNOSIS — F3289 Other specified depressive episodes: Secondary | ICD-10-CM | POA: Diagnosis not present

## 2013-07-17 DIAGNOSIS — Z433 Encounter for attention to colostomy: Secondary | ICD-10-CM | POA: Diagnosis not present

## 2013-07-17 DIAGNOSIS — D518 Other vitamin B12 deficiency anemias: Secondary | ICD-10-CM | POA: Diagnosis not present

## 2013-07-17 DIAGNOSIS — F329 Major depressive disorder, single episode, unspecified: Secondary | ICD-10-CM | POA: Diagnosis not present

## 2013-07-17 DIAGNOSIS — J441 Chronic obstructive pulmonary disease with (acute) exacerbation: Secondary | ICD-10-CM | POA: Diagnosis not present

## 2013-07-17 DIAGNOSIS — F3289 Other specified depressive episodes: Secondary | ICD-10-CM | POA: Diagnosis not present

## 2013-07-17 DIAGNOSIS — L989 Disorder of the skin and subcutaneous tissue, unspecified: Secondary | ICD-10-CM | POA: Diagnosis not present

## 2013-07-17 DIAGNOSIS — H3530 Unspecified macular degeneration: Secondary | ICD-10-CM | POA: Diagnosis not present

## 2013-07-24 DIAGNOSIS — H3530 Unspecified macular degeneration: Secondary | ICD-10-CM | POA: Diagnosis not present

## 2013-07-24 DIAGNOSIS — D518 Other vitamin B12 deficiency anemias: Secondary | ICD-10-CM | POA: Diagnosis not present

## 2013-07-24 DIAGNOSIS — J441 Chronic obstructive pulmonary disease with (acute) exacerbation: Secondary | ICD-10-CM | POA: Diagnosis not present

## 2013-07-24 DIAGNOSIS — Z433 Encounter for attention to colostomy: Secondary | ICD-10-CM | POA: Diagnosis not present

## 2013-07-24 DIAGNOSIS — L989 Disorder of the skin and subcutaneous tissue, unspecified: Secondary | ICD-10-CM | POA: Diagnosis not present

## 2013-07-24 DIAGNOSIS — F3289 Other specified depressive episodes: Secondary | ICD-10-CM | POA: Diagnosis not present

## 2013-07-24 DIAGNOSIS — F329 Major depressive disorder, single episode, unspecified: Secondary | ICD-10-CM | POA: Diagnosis not present

## 2013-08-01 ENCOUNTER — Telehealth: Payer: Self-pay | Admitting: *Deleted

## 2013-08-01 DIAGNOSIS — F329 Major depressive disorder, single episode, unspecified: Secondary | ICD-10-CM | POA: Diagnosis not present

## 2013-08-01 DIAGNOSIS — H3530 Unspecified macular degeneration: Secondary | ICD-10-CM | POA: Diagnosis not present

## 2013-08-01 DIAGNOSIS — F3289 Other specified depressive episodes: Secondary | ICD-10-CM | POA: Diagnosis not present

## 2013-08-01 DIAGNOSIS — Z433 Encounter for attention to colostomy: Secondary | ICD-10-CM | POA: Diagnosis not present

## 2013-08-01 DIAGNOSIS — J441 Chronic obstructive pulmonary disease with (acute) exacerbation: Secondary | ICD-10-CM | POA: Diagnosis not present

## 2013-08-01 DIAGNOSIS — L989 Disorder of the skin and subcutaneous tissue, unspecified: Secondary | ICD-10-CM | POA: Diagnosis not present

## 2013-08-01 DIAGNOSIS — D518 Other vitamin B12 deficiency anemias: Secondary | ICD-10-CM | POA: Diagnosis not present

## 2013-08-01 NOTE — Telephone Encounter (Signed)
Call from [facility] with [facility] [phone]  Nurse is asking for orders to continue with colostomy care and recertify pt for another 60 days. Pt of Dr Owens Shark.

## 2013-08-02 NOTE — Telephone Encounter (Signed)
VO given to [facility]

## 2013-08-02 NOTE — Telephone Encounter (Signed)
OK to give VO? 

## 2013-08-04 DIAGNOSIS — L989 Disorder of the skin and subcutaneous tissue, unspecified: Secondary | ICD-10-CM | POA: Diagnosis not present

## 2013-08-04 DIAGNOSIS — J441 Chronic obstructive pulmonary disease with (acute) exacerbation: Secondary | ICD-10-CM | POA: Diagnosis not present

## 2013-08-04 DIAGNOSIS — F3289 Other specified depressive episodes: Secondary | ICD-10-CM | POA: Diagnosis not present

## 2013-08-04 DIAGNOSIS — Z433 Encounter for attention to colostomy: Secondary | ICD-10-CM | POA: Diagnosis not present

## 2013-08-04 DIAGNOSIS — H3530 Unspecified macular degeneration: Secondary | ICD-10-CM | POA: Diagnosis not present

## 2013-08-04 DIAGNOSIS — F329 Major depressive disorder, single episode, unspecified: Secondary | ICD-10-CM | POA: Diagnosis not present

## 2013-08-04 DIAGNOSIS — Z602 Problems related to living alone: Secondary | ICD-10-CM | POA: Diagnosis not present

## 2013-08-04 DIAGNOSIS — D518 Other vitamin B12 deficiency anemias: Secondary | ICD-10-CM | POA: Diagnosis not present

## 2013-08-07 DIAGNOSIS — L989 Disorder of the skin and subcutaneous tissue, unspecified: Secondary | ICD-10-CM | POA: Diagnosis not present

## 2013-08-07 DIAGNOSIS — F329 Major depressive disorder, single episode, unspecified: Secondary | ICD-10-CM | POA: Diagnosis not present

## 2013-08-07 DIAGNOSIS — J441 Chronic obstructive pulmonary disease with (acute) exacerbation: Secondary | ICD-10-CM | POA: Diagnosis not present

## 2013-08-07 DIAGNOSIS — F3289 Other specified depressive episodes: Secondary | ICD-10-CM | POA: Diagnosis not present

## 2013-08-07 DIAGNOSIS — H3530 Unspecified macular degeneration: Secondary | ICD-10-CM | POA: Diagnosis not present

## 2013-08-07 DIAGNOSIS — Z433 Encounter for attention to colostomy: Secondary | ICD-10-CM | POA: Diagnosis not present

## 2013-08-07 DIAGNOSIS — D518 Other vitamin B12 deficiency anemias: Secondary | ICD-10-CM | POA: Diagnosis not present

## 2013-08-14 DIAGNOSIS — F329 Major depressive disorder, single episode, unspecified: Secondary | ICD-10-CM | POA: Diagnosis not present

## 2013-08-14 DIAGNOSIS — J441 Chronic obstructive pulmonary disease with (acute) exacerbation: Secondary | ICD-10-CM | POA: Diagnosis not present

## 2013-08-14 DIAGNOSIS — D518 Other vitamin B12 deficiency anemias: Secondary | ICD-10-CM | POA: Diagnosis not present

## 2013-08-14 DIAGNOSIS — F3289 Other specified depressive episodes: Secondary | ICD-10-CM | POA: Diagnosis not present

## 2013-08-14 DIAGNOSIS — H3530 Unspecified macular degeneration: Secondary | ICD-10-CM | POA: Diagnosis not present

## 2013-08-14 DIAGNOSIS — L989 Disorder of the skin and subcutaneous tissue, unspecified: Secondary | ICD-10-CM | POA: Diagnosis not present

## 2013-08-14 DIAGNOSIS — Z433 Encounter for attention to colostomy: Secondary | ICD-10-CM | POA: Diagnosis not present

## 2013-08-21 DIAGNOSIS — F3289 Other specified depressive episodes: Secondary | ICD-10-CM | POA: Diagnosis not present

## 2013-08-21 DIAGNOSIS — F329 Major depressive disorder, single episode, unspecified: Secondary | ICD-10-CM | POA: Diagnosis not present

## 2013-08-21 DIAGNOSIS — Z433 Encounter for attention to colostomy: Secondary | ICD-10-CM | POA: Diagnosis not present

## 2013-08-21 DIAGNOSIS — J441 Chronic obstructive pulmonary disease with (acute) exacerbation: Secondary | ICD-10-CM | POA: Diagnosis not present

## 2013-08-21 DIAGNOSIS — H3530 Unspecified macular degeneration: Secondary | ICD-10-CM | POA: Diagnosis not present

## 2013-08-21 DIAGNOSIS — D518 Other vitamin B12 deficiency anemias: Secondary | ICD-10-CM | POA: Diagnosis not present

## 2013-08-21 DIAGNOSIS — L989 Disorder of the skin and subcutaneous tissue, unspecified: Secondary | ICD-10-CM | POA: Diagnosis not present

## 2013-08-28 DIAGNOSIS — L989 Disorder of the skin and subcutaneous tissue, unspecified: Secondary | ICD-10-CM | POA: Diagnosis not present

## 2013-08-28 DIAGNOSIS — Z433 Encounter for attention to colostomy: Secondary | ICD-10-CM | POA: Diagnosis not present

## 2013-08-28 DIAGNOSIS — F329 Major depressive disorder, single episode, unspecified: Secondary | ICD-10-CM | POA: Diagnosis not present

## 2013-08-28 DIAGNOSIS — J441 Chronic obstructive pulmonary disease with (acute) exacerbation: Secondary | ICD-10-CM | POA: Diagnosis not present

## 2013-08-28 DIAGNOSIS — F3289 Other specified depressive episodes: Secondary | ICD-10-CM | POA: Diagnosis not present

## 2013-08-28 DIAGNOSIS — H3530 Unspecified macular degeneration: Secondary | ICD-10-CM | POA: Diagnosis not present

## 2013-08-28 DIAGNOSIS — D518 Other vitamin B12 deficiency anemias: Secondary | ICD-10-CM | POA: Diagnosis not present

## 2013-08-31 ENCOUNTER — Ambulatory Visit (INDEPENDENT_AMBULATORY_CARE_PROVIDER_SITE_OTHER): Payer: Medicare Other | Admitting: Internal Medicine

## 2013-08-31 ENCOUNTER — Encounter: Payer: Self-pay | Admitting: Internal Medicine

## 2013-08-31 VITALS — BP 122/61 | HR 106 | Temp 98.4°F | Wt 153.6 lb

## 2013-08-31 DIAGNOSIS — G894 Chronic pain syndrome: Secondary | ICD-10-CM | POA: Diagnosis not present

## 2013-08-31 DIAGNOSIS — Z933 Colostomy status: Secondary | ICD-10-CM

## 2013-08-31 MED ORDER — HYDROCODONE-ACETAMINOPHEN 7.5-325 MG PO TABS: 1.0000 | ORAL_TABLET | Freq: Three times a day (TID) | ORAL | Status: DC | PRN

## 2013-08-31 MED ORDER — MORPHINE SULFATE ER 15 MG PO TBCR: 30.0000 mg | EXTENDED_RELEASE_TABLET | Freq: Two times a day (BID) | ORAL | Status: DC

## 2013-08-31 MED ORDER — LIDOCAINE 5 % EX PTCH: 1.0000 | MEDICATED_PATCH | Freq: Two times a day (BID) | CUTANEOUS | Status: DC

## 2013-08-31 NOTE — Assessment & Plan Note (Signed)
Pt has chronic back pain. Pain regimen was Norco 7.5-325 mg Q6H, MS Contin 15 mg BID, and Baclofen. Pt has been taking medications differently than prescribed. She has been taking Norco 5 times daily and MS Contin 30 mg once daily. Pt had relief of pain by taking increased dose of MS Contin once daily. - Increase MS Contin to 30 mg BID - Decrease Norco to Q8H - Lidocaine patches - Pt instructed on new pain regimen. She was able to repeat doses back to me and understands importance of cutting back Norco with increase in MS Contin. She understands to come back to clinic immediately if she develops constipation, diarrhea, fever.  - f/u appointment in 2 weeks to determine if new pain regimen sufficient

## 2013-08-31 NOTE — Progress Notes (Signed)
   Subjective:    Patient ID: [name] Bell, female    DOB: [birth year], 68 y.o.   MRN: [id number]  HPI [name] Bell is a 68yo woman w/ PMHx of COPD, macular degeneration, ischemic colitis, diverticulitis of colon w/ perforation s/p colostomy in 07/2011, and chronic pain syndrome who presents today for a follow up appointment for the following:  1. Chronic Back Pain: Patient has hx of compression fracture at T12 and degenerative disc disease. She states her back pain has been worsening since her last clinic appointment in May 2015. She states the pain is in her mid-back, constant, and radiates around to the front on both sides. She reports she has been in bed more due to the pain. She denies shooting pain down her legs, saddle anesthesia, and loss of bowels or bladder. Her current pain regimen includes Norco 7.5-325 mg 5 times a day, MS Contin 15 mg BID, and Baclofen 5 mg TID. However, she reports she has not been taking her MS Contin as prescribed for the last 3-4 days. She has been skipping her morning dose and taking both 15 mg doses at 4PM. She states when she does this she does not have any pain and sleeps well through the night. Also by doing this, she has actually been able to perform some ADLs that she previously was not able to do due to pain. She states the Baclofen does not help her pain. She notes Flexeril and Robaxin worked in the past but her insurance company refuses to pay for them any more.   2. Recurrent Ostomy Infections: Pt has had recurrent infections of her colostomy since 2013. She currently has a home health nurse that manages her ostomy and changes the dressings. Pt notes she had blood and pus in her ostomy bag several weeks ago and in the past, but none currently. She reports tenderness in her abdomen around the ostomy site as well as skin breakdown from the adhesives. She denies fever, chills, diarrhea, constipation.    Review of Systems General: Denies changes in weight,  malaise, night sweats HEENT: Denies headaches, ear pain, rhinorrhea, sore throat CV: Denies CP, palpitations, SOB, orthopnea Pulm: Denies SOB, cough GI: See HPI GU: Denies dysuria, hematuria, frequency Msk: Denies joint pain Neuro: Denies numbness, tingling, weakness Skin: Denies rashes, bruising    Objective:   Physical Exam General: Elderly woman sitting in chair, anxious HEENT: [place]/AT, EOMI, mucus membranes moist Neck: supple, no JVD, no carotid bruits CV: RRR, normal S1/S2, no m/g/r Pulm: CTA bilaterally, breaths non-labored Abd: BS+, soft, tenderness near ostomy site, ostomy has clean bandage and minimal amount of stool in bag Ext: moves all, warm, no edema Neuro: alert and oriented x 3, anxious and tearful at times, CNs II-XII intact    Assessment & Plan:

## 2013-08-31 NOTE — Patient Instructions (Signed)
It was a pleasure taking care of you today, Ms. Yaun.  Here is a summary of what we discussed today:  1. Back Pain: - Morphine- we increased dose to 30 mg twice a day. Please take two 15 mg pills twice a day. - Lidocaine patch - Please only take 1 Norco every 8 hours as needed - Please return to clinic if you develop constipation, fever - follow-up appointment in 2 weeks  2. Ostomy - Continue wound care with home nurse

## 2013-08-31 NOTE — Assessment & Plan Note (Signed)
Colostomy does not appear to be infected. Pt has home health nurse that is managing colostomy. - Continue home health - Monitor for constipation/diarrhea since changing pain regimen - Pt instructed to return immediately to clinic if experiences constipation, diarrhea, fever

## 2013-09-04 DIAGNOSIS — F3289 Other specified depressive episodes: Secondary | ICD-10-CM | POA: Diagnosis not present

## 2013-09-04 DIAGNOSIS — Z433 Encounter for attention to colostomy: Secondary | ICD-10-CM | POA: Diagnosis not present

## 2013-09-04 DIAGNOSIS — J441 Chronic obstructive pulmonary disease with (acute) exacerbation: Secondary | ICD-10-CM | POA: Diagnosis not present

## 2013-09-04 DIAGNOSIS — F329 Major depressive disorder, single episode, unspecified: Secondary | ICD-10-CM | POA: Diagnosis not present

## 2013-09-04 DIAGNOSIS — H3530 Unspecified macular degeneration: Secondary | ICD-10-CM | POA: Diagnosis not present

## 2013-09-04 DIAGNOSIS — D518 Other vitamin B12 deficiency anemias: Secondary | ICD-10-CM | POA: Diagnosis not present

## 2013-09-04 DIAGNOSIS — L989 Disorder of the skin and subcutaneous tissue, unspecified: Secondary | ICD-10-CM | POA: Diagnosis not present

## 2013-09-05 NOTE — Progress Notes (Signed)
INTERNAL MEDICINE TEACHING ATTENDING ADDENDUM - Aldine Contes, MD: I personally saw and evaluated Ms. Friedhoff in this clinic visit in conjunction with the resident, Dr. Arcelia Jew. I have discussed patient's plan of care with medical resident during this visit. I have confirmed the physical exam findings and have read and agree with the clinic note including the plan with the following addition: - Patient here for follow up - On exam: Lungs - CTA b/l, Cardio- RRR, normal heart sounds, Neuro- no focal deficits - Will increase morphine to 30 mg 2 *day and decrease narco to q 8 hrs prn - Will add lidocaine patches as needed over back - colostomy site does not appear infected. HHN following patient and managing colostomy

## 2013-09-11 DIAGNOSIS — D518 Other vitamin B12 deficiency anemias: Secondary | ICD-10-CM | POA: Diagnosis not present

## 2013-09-11 DIAGNOSIS — J441 Chronic obstructive pulmonary disease with (acute) exacerbation: Secondary | ICD-10-CM | POA: Diagnosis not present

## 2013-09-11 DIAGNOSIS — L989 Disorder of the skin and subcutaneous tissue, unspecified: Secondary | ICD-10-CM | POA: Diagnosis not present

## 2013-09-11 DIAGNOSIS — H3530 Unspecified macular degeneration: Secondary | ICD-10-CM | POA: Diagnosis not present

## 2013-09-11 DIAGNOSIS — F3289 Other specified depressive episodes: Secondary | ICD-10-CM | POA: Diagnosis not present

## 2013-09-11 DIAGNOSIS — Z433 Encounter for attention to colostomy: Secondary | ICD-10-CM | POA: Diagnosis not present

## 2013-09-11 DIAGNOSIS — F329 Major depressive disorder, single episode, unspecified: Secondary | ICD-10-CM | POA: Diagnosis not present

## 2013-09-17 DIAGNOSIS — Z433 Encounter for attention to colostomy: Secondary | ICD-10-CM | POA: Diagnosis not present

## 2013-09-17 DIAGNOSIS — F329 Major depressive disorder, single episode, unspecified: Secondary | ICD-10-CM | POA: Diagnosis not present

## 2013-09-17 DIAGNOSIS — H3530 Unspecified macular degeneration: Secondary | ICD-10-CM | POA: Diagnosis not present

## 2013-09-17 DIAGNOSIS — L989 Disorder of the skin and subcutaneous tissue, unspecified: Secondary | ICD-10-CM | POA: Diagnosis not present

## 2013-09-17 DIAGNOSIS — D518 Other vitamin B12 deficiency anemias: Secondary | ICD-10-CM | POA: Diagnosis not present

## 2013-09-17 DIAGNOSIS — F3289 Other specified depressive episodes: Secondary | ICD-10-CM | POA: Diagnosis not present

## 2013-09-17 DIAGNOSIS — J441 Chronic obstructive pulmonary disease with (acute) exacerbation: Secondary | ICD-10-CM | POA: Diagnosis not present

## 2013-09-20 ENCOUNTER — Other Ambulatory Visit: Payer: Self-pay | Admitting: Internal Medicine

## 2013-09-20 MED ORDER — MORPHINE SULFATE ER 15 MG PO TBCR: 30.0000 mg | EXTENDED_RELEASE_TABLET | Freq: Two times a day (BID) | ORAL | Status: DC

## 2013-09-25 DIAGNOSIS — D518 Other vitamin B12 deficiency anemias: Secondary | ICD-10-CM | POA: Diagnosis not present

## 2013-09-25 DIAGNOSIS — H3530 Unspecified macular degeneration: Secondary | ICD-10-CM | POA: Diagnosis not present

## 2013-09-25 DIAGNOSIS — Z433 Encounter for attention to colostomy: Secondary | ICD-10-CM | POA: Diagnosis not present

## 2013-09-25 DIAGNOSIS — L989 Disorder of the skin and subcutaneous tissue, unspecified: Secondary | ICD-10-CM | POA: Diagnosis not present

## 2013-09-25 DIAGNOSIS — J441 Chronic obstructive pulmonary disease with (acute) exacerbation: Secondary | ICD-10-CM | POA: Diagnosis not present

## 2013-09-25 DIAGNOSIS — F329 Major depressive disorder, single episode, unspecified: Secondary | ICD-10-CM | POA: Diagnosis not present

## 2013-09-25 DIAGNOSIS — F3289 Other specified depressive episodes: Secondary | ICD-10-CM | POA: Diagnosis not present

## 2013-10-02 DIAGNOSIS — F3289 Other specified depressive episodes: Secondary | ICD-10-CM | POA: Diagnosis not present

## 2013-10-02 DIAGNOSIS — H3530 Unspecified macular degeneration: Secondary | ICD-10-CM | POA: Diagnosis not present

## 2013-10-02 DIAGNOSIS — D518 Other vitamin B12 deficiency anemias: Secondary | ICD-10-CM | POA: Diagnosis not present

## 2013-10-02 DIAGNOSIS — J441 Chronic obstructive pulmonary disease with (acute) exacerbation: Secondary | ICD-10-CM | POA: Diagnosis not present

## 2013-10-02 DIAGNOSIS — F329 Major depressive disorder, single episode, unspecified: Secondary | ICD-10-CM | POA: Diagnosis not present

## 2013-10-02 DIAGNOSIS — L989 Disorder of the skin and subcutaneous tissue, unspecified: Secondary | ICD-10-CM | POA: Diagnosis not present

## 2013-10-02 DIAGNOSIS — Z433 Encounter for attention to colostomy: Secondary | ICD-10-CM | POA: Diagnosis not present

## 2013-10-03 DIAGNOSIS — D519 Vitamin B12 deficiency anemia, unspecified: Secondary | ICD-10-CM | POA: Diagnosis not present

## 2013-10-03 DIAGNOSIS — J441 Chronic obstructive pulmonary disease with (acute) exacerbation: Secondary | ICD-10-CM | POA: Diagnosis not present

## 2013-10-03 DIAGNOSIS — Z433 Encounter for attention to colostomy: Secondary | ICD-10-CM | POA: Diagnosis not present

## 2013-10-03 DIAGNOSIS — Z720 Tobacco use: Secondary | ICD-10-CM | POA: Diagnosis not present

## 2013-10-03 DIAGNOSIS — G894 Chronic pain syndrome: Secondary | ICD-10-CM | POA: Diagnosis not present

## 2013-10-03 DIAGNOSIS — H3530 Unspecified macular degeneration: Secondary | ICD-10-CM | POA: Diagnosis not present

## 2013-10-03 DIAGNOSIS — L989 Disorder of the skin and subcutaneous tissue, unspecified: Secondary | ICD-10-CM | POA: Diagnosis not present

## 2013-10-03 DIAGNOSIS — K9400 Colostomy complication, unspecified: Secondary | ICD-10-CM | POA: Diagnosis not present

## 2013-10-03 DIAGNOSIS — Z602 Problems related to living alone: Secondary | ICD-10-CM | POA: Diagnosis not present

## 2013-10-03 DIAGNOSIS — F329 Major depressive disorder, single episode, unspecified: Secondary | ICD-10-CM | POA: Diagnosis not present

## 2013-10-05 ENCOUNTER — Telehealth: Payer: Self-pay | Admitting: *Deleted

## 2013-10-05 NOTE — Telephone Encounter (Signed)
Received faxed PA request from pt's pharmacy for her Lidocaine patches.  Medication is non-preferred.  PA form needs to be completed/signed by MD and faxed back. Form placed in MD's box for completion.  The two questions that needs to be answered on form are below:   1) Is Lidocaine patch being prescribed for pain associated with post-herpetic neuralgia?  2) is Lidocaine patch being prescribed for diabetic neuropathy?  Regenia Skeeter, Rushi Chasen Cassady9/10/20152:12 PM

## 2013-10-10 ENCOUNTER — Telehealth: Payer: Self-pay | Admitting: *Deleted

## 2013-10-10 ENCOUNTER — Other Ambulatory Visit: Payer: Self-pay | Admitting: *Deleted

## 2013-10-10 DIAGNOSIS — G894 Chronic pain syndrome: Secondary | ICD-10-CM | POA: Diagnosis not present

## 2013-10-10 DIAGNOSIS — H3530 Unspecified macular degeneration: Secondary | ICD-10-CM | POA: Diagnosis not present

## 2013-10-10 DIAGNOSIS — K9400 Colostomy complication, unspecified: Secondary | ICD-10-CM | POA: Diagnosis not present

## 2013-10-10 DIAGNOSIS — Z433 Encounter for attention to colostomy: Secondary | ICD-10-CM | POA: Diagnosis not present

## 2013-10-10 DIAGNOSIS — F329 Major depressive disorder, single episode, unspecified: Secondary | ICD-10-CM | POA: Diagnosis not present

## 2013-10-10 DIAGNOSIS — J441 Chronic obstructive pulmonary disease with (acute) exacerbation: Secondary | ICD-10-CM | POA: Diagnosis not present

## 2013-10-10 NOTE — Telephone Encounter (Addendum)
Form completed by MD and faxed to [employer]. Decision may take up to 72hrs.Despina Hidden Cassady9/15/20154:55 PM   Dx: 338.4 (Chronic back pain) 1) Is Lidocaine patch being prescribed for pain associated with post-herpetic neuralgia? NO  2) is Lidocaine patch being prescribed for diabetic neuropathy? NO

## 2013-10-10 NOTE — Telephone Encounter (Signed)
Call from [place], [place] for [facility] [phone]  Nurse has concerns about wound next to left side of stoma ( colostomy ) Wound is not healing and is painful.  I called pt and area has bothered her for 2 weeks. Will see in clinic tomorrow to evaluate and ? refer.

## 2013-10-10 NOTE — Telephone Encounter (Signed)
I think that is a reasonable plan.  She may need evaluation from the surgeon who did the procedure or a new referral.

## 2013-10-11 ENCOUNTER — Encounter (HOSPITAL_COMMUNITY): Payer: Self-pay | Admitting: General Practice

## 2013-10-11 ENCOUNTER — Ambulatory Visit (INDEPENDENT_AMBULATORY_CARE_PROVIDER_SITE_OTHER): Payer: Medicare Other | Admitting: Internal Medicine

## 2013-10-11 ENCOUNTER — Encounter: Payer: Self-pay | Admitting: Internal Medicine

## 2013-10-11 ENCOUNTER — Observation Stay (HOSPITAL_COMMUNITY)
Admission: AD | Admit: 2013-10-11 | Discharge: 2013-10-13 | Disposition: A | Discharge status: Home / Self Care | Payer: Medicare Other | Source: Ambulatory Visit | Attending: Internal Medicine | Admitting: Internal Medicine

## 2013-10-11 VITALS — BP 138/54 | HR 93 | Temp 98.2°F | Ht 64.0 in | Wt 156.0 lb

## 2013-10-11 DIAGNOSIS — IMO0002 Reserved for concepts with insufficient information to code with codable children: Secondary | ICD-10-CM | POA: Insufficient documentation

## 2013-10-11 DIAGNOSIS — E441 Mild protein-calorie malnutrition: Secondary | ICD-10-CM | POA: Diagnosis not present

## 2013-10-11 DIAGNOSIS — Z87891 Personal history of nicotine dependence: Secondary | ICD-10-CM | POA: Diagnosis present

## 2013-10-11 DIAGNOSIS — G894 Chronic pain syndrome: Secondary | ICD-10-CM | POA: Diagnosis not present

## 2013-10-11 DIAGNOSIS — E785 Hyperlipidemia, unspecified: Secondary | ICD-10-CM

## 2013-10-11 DIAGNOSIS — K5900 Constipation, unspecified: Secondary | ICD-10-CM | POA: Insufficient documentation

## 2013-10-11 DIAGNOSIS — Z91040 Latex allergy status: Secondary | ICD-10-CM | POA: Insufficient documentation

## 2013-10-11 DIAGNOSIS — E876 Hypokalemia: Secondary | ICD-10-CM

## 2013-10-11 DIAGNOSIS — R112 Nausea with vomiting, unspecified: Secondary | ICD-10-CM | POA: Insufficient documentation

## 2013-10-11 DIAGNOSIS — Y833 Surgical operation with formation of external stoma as the cause of abnormal reaction of the patient, or of later complication, without mention of misadventure at the time of the procedure: Secondary | ICD-10-CM | POA: Insufficient documentation

## 2013-10-11 DIAGNOSIS — Z8719 Personal history of other diseases of the digestive system: Secondary | ICD-10-CM | POA: Insufficient documentation

## 2013-10-11 DIAGNOSIS — R109 Unspecified abdominal pain: Secondary | ICD-10-CM | POA: Diagnosis not present

## 2013-10-11 DIAGNOSIS — Z79891 Long term (current) use of opiate analgesic: Secondary | ICD-10-CM | POA: Diagnosis present

## 2013-10-11 DIAGNOSIS — Z888 Allergy status to other drugs, medicaments and biological substances status: Secondary | ICD-10-CM | POA: Diagnosis not present

## 2013-10-11 DIAGNOSIS — K9400 Colostomy complication, unspecified: Secondary | ICD-10-CM

## 2013-10-11 DIAGNOSIS — G8929 Other chronic pain: Secondary | ICD-10-CM | POA: Insufficient documentation

## 2013-10-11 DIAGNOSIS — Z933 Colostomy status: Secondary | ICD-10-CM

## 2013-10-11 DIAGNOSIS — Z886 Allergy status to analgesic agent status: Secondary | ICD-10-CM | POA: Diagnosis not present

## 2013-10-11 HISTORY — DX: Chronic or unspecified peptic ulcer, site unspecified, with hemorrhage: K27.4

## 2013-10-11 HISTORY — DX: Chronic or unspecified gastrojejunal ulcer with hemorrhage: K28.4

## 2013-10-11 HISTORY — DX: Personal history of other medical treatment: Z92.89

## 2013-10-11 HISTORY — DX: Anxiety disorder, unspecified: F41.9

## 2013-10-11 HISTORY — DX: Unqualified visual loss, both eyes: H54.3

## 2013-10-11 HISTORY — DX: Exudative age-related macular degeneration, unspecified eye, stage unspecified: H35.3290

## 2013-10-11 HISTORY — DX: Dorsalgia, unspecified: M54.9

## 2013-10-11 HISTORY — DX: Basal cell carcinoma of skin, unspecified: C44.91

## 2013-10-11 HISTORY — DX: Other chronic pain: G89.29

## 2013-10-11 LAB — CBC WITH DIFFERENTIAL/PLATELET
BASOS PCT: 0 % (ref 0–1)
Basophils Absolute: 0 10*3/uL (ref 0.0–0.1)
EOS PCT: 1 % (ref 0–5)
Eosinophils Absolute: 0.1 10*3/uL (ref 0.0–0.7)
HEMATOCRIT: 41.4 % (ref 36.0–46.0)
HEMOGLOBIN: 14 g/dL (ref 12.0–15.0)
Lymphocytes Relative: 33 % (ref 12–46)
Lymphs Abs: 3.1 10*3/uL (ref 0.7–4.0)
MCH: 29.5 pg (ref 26.0–34.0)
MCHC: 33.8 g/dL (ref 30.0–36.0)
MCV: 87.3 fL (ref 78.0–100.0)
MONO ABS: 0.5 10*3/uL (ref 0.1–1.0)
Monocytes Relative: 5 % (ref 3–12)
NEUTROS ABS: 5.7 10*3/uL (ref 1.7–7.7)
Neutrophils Relative %: 61 % (ref 43–77)
Platelets: 296 10*3/uL (ref 150–400)
RBC: 4.74 MIL/uL (ref 3.87–5.11)
RDW: 15 % (ref 11.5–15.5)
WBC: 9.4 10*3/uL (ref 4.0–10.5)

## 2013-10-11 LAB — COMPLETE METABOLIC PANEL WITH GFR
ALT: 12 U/L (ref 0–35)
AST: 21 U/L (ref 0–37)
Albumin: 3.4 g/dL — ABNORMAL LOW (ref 3.5–5.2)
Alkaline Phosphatase: 110 U/L (ref 39–117)
BUN: 10 mg/dL (ref 6–23)
CHLORIDE: 102 meq/L (ref 96–112)
CO2: 22 mEq/L (ref 19–32)
CREATININE: 0.69 mg/dL (ref 0.50–1.10)
Calcium: 9.2 mg/dL (ref 8.4–10.5)
GFR, EST NON AFRICAN AMERICAN: 88 mL/min
GFR, Est African American: >89 mL/min
Glucose, Bld: 97 mg/dL (ref 70–99)
Potassium: 3.9 mEq/L (ref 3.5–5.3)
Sodium: 140 mEq/L (ref 135–145)
Total Bilirubin: <0.2 mg/dL — ABNORMAL LOW (ref 0.2–1.2)
Total Protein: 6.9 g/dL (ref 6.0–8.3)

## 2013-10-11 LAB — URINALYSIS, ROUTINE W REFLEX MICROSCOPIC
BILIRUBIN URINE: NEGATIVE
GLUCOSE, UA: NEGATIVE mg/dL
Hgb urine dipstick: NEGATIVE
Ketones, ur: NEGATIVE mg/dL
Leukocytes, UA: NEGATIVE
Nitrite: NEGATIVE
PROTEIN: NEGATIVE mg/dL
SPECIFIC GRAVITY, URINE: 1.013 (ref 1.005–1.030)
Urobilinogen, UA: 0.2 mg/dL (ref 0.0–1.0)
pH: 5 (ref 5.0–8.0)

## 2013-10-11 LAB — LACTIC ACID, PLASMA: Lactic Acid, Venous: 0.8 mmol/L (ref 0.5–2.2)

## 2013-10-11 MED ORDER — INFLUENZA VAC SPLIT QUAD 0.5 ML IM SUSY
0.5000 mL | PREFILLED_SYRINGE | INTRAMUSCULAR | Status: AC
  Administered 2013-10-12: 0.5 mL via INTRAMUSCULAR
  Filled 2013-10-11: qty 0.5

## 2013-10-11 MED ORDER — IOHEXOL 300 MG/ML  SOLN
25.0000 mL | INTRAMUSCULAR | Status: AC
  Administered 2013-10-11 (×2): 25 mL via ORAL

## 2013-10-11 MED ORDER — METHOCARBAMOL 500 MG PO TABS
750.0000 mg | ORAL_TABLET | Freq: Four times a day (QID) | ORAL | Status: DC | PRN
  Administered 2013-10-11 – 2013-10-13 (×5): 750 mg via ORAL
  Filled 2013-10-11 (×5): qty 2

## 2013-10-11 MED ORDER — HYDROCORTISONE 0.5 % EX CREA
TOPICAL_CREAM | Freq: Three times a day (TID) | CUTANEOUS | Status: DC | PRN
  Filled 2013-10-11: qty 28.35

## 2013-10-11 MED ORDER — BACLOFEN 10 MG PO TABS
5.0000 mg | ORAL_TABLET | Freq: Three times a day (TID) | ORAL | Status: DC | PRN
  Administered 2013-10-11: 5 mg via ORAL
  Filled 2013-10-11: qty 1

## 2013-10-11 MED ORDER — PROMETHAZINE HCL 25 MG PO TABS
12.5000 mg | ORAL_TABLET | Freq: Four times a day (QID) | ORAL | Status: DC | PRN
  Administered 2013-10-11 – 2013-10-12 (×2): 12.5 mg via ORAL
  Filled 2013-10-11 (×2): qty 1

## 2013-10-11 MED ORDER — SIMVASTATIN 40 MG PO TABS
40.0000 mg | ORAL_TABLET | Freq: Every day | ORAL | Status: DC
  Administered 2013-10-12: 40 mg via ORAL
  Filled 2013-10-11 (×2): qty 1

## 2013-10-11 MED ORDER — VITAMIN D3 25 MCG (1000 UNIT) PO TABS
1000.0000 [IU] | ORAL_TABLET | Freq: Three times a day (TID) | ORAL | Status: DC
  Administered 2013-10-11 – 2013-10-13 (×4): 1000 [IU] via ORAL
  Filled 2013-10-11 (×8): qty 1

## 2013-10-11 MED ORDER — PANTOPRAZOLE SODIUM 40 MG PO TBEC
40.0000 mg | DELAYED_RELEASE_TABLET | Freq: Two times a day (BID) | ORAL | Status: DC
  Administered 2013-10-11 – 2013-10-13 (×4): 40 mg via ORAL
  Filled 2013-10-11 (×4): qty 1

## 2013-10-11 MED ORDER — MORPHINE SULFATE ER 30 MG PO TBCR
30.0000 mg | EXTENDED_RELEASE_TABLET | Freq: Two times a day (BID) | ORAL | Status: DC
  Administered 2013-10-11 – 2013-10-13 (×4): 30 mg via ORAL
  Filled 2013-10-11 (×4): qty 1

## 2013-10-11 MED ORDER — IPRATROPIUM-ALBUTEROL 0.5-2.5 (3) MG/3ML IN SOLN: 3.0000 mL | RESPIRATORY_TRACT | Status: DC | PRN

## 2013-10-11 MED ORDER — DOCUSATE SODIUM 100 MG PO CAPS
100.0000 mg | ORAL_CAPSULE | Freq: Every day | ORAL | Status: DC | PRN
  Administered 2013-10-12 – 2013-10-13 (×2): 100 mg via ORAL
  Filled 2013-10-11 (×2): qty 1

## 2013-10-11 MED ORDER — HYDROCODONE-ACETAMINOPHEN 7.5-325 MG PO TABS
1.0000 | ORAL_TABLET | Freq: Three times a day (TID) | ORAL | Status: DC | PRN
  Administered 2013-10-11 – 2013-10-12 (×3): 1 via ORAL
  Filled 2013-10-11 (×3): qty 1

## 2013-10-11 MED ORDER — ENOXAPARIN SODIUM 40 MG/0.4ML ~~LOC~~ SOLN
40.0000 mg | SUBCUTANEOUS | Status: DC
  Administered 2013-10-11 – 2013-10-12 (×2): 40 mg via SUBCUTANEOUS
  Filled 2013-10-11 (×4): qty 0.4

## 2013-10-11 MED ORDER — CALCIUM CARBONATE-VITAMIN D 500-200 MG-UNIT PO TABS
1.0000 | ORAL_TABLET | Freq: Three times a day (TID) | ORAL | Status: DC
  Administered 2013-10-11 – 2013-10-12 (×3): 1 via ORAL
  Filled 2013-10-11 (×8): qty 1

## 2013-10-11 MED ORDER — BUPROPION HCL 100 MG PO TABS
100.0000 mg | ORAL_TABLET | Freq: Four times a day (QID) | ORAL | Status: DC
  Administered 2013-10-11 – 2013-10-13 (×6): 100 mg via ORAL
  Filled 2013-10-11 (×10): qty 1

## 2013-10-11 NOTE — Progress Notes (Signed)
Received direct admit patient from outpatient department.  VS stable. Alert and oriented x 4.  Partially blind bilaterally due to macular degeneration.  Internal Medicine resident paged.

## 2013-10-11 NOTE — Telephone Encounter (Signed)
Received faxed confirmation that medication has been denied.    (of note, pt was seen  today in clinic and admitted into hospital).Despina Hidden Cassady9/16/20154:39 PM

## 2013-10-11 NOTE — H&P (Signed)
Date: 10/11/2013               Patient Name:  [name] Bell MRN: [id number]  DOB: 06/23/1945 Age / Sex: 68 y.o., female   PCP: Albin Felling, MD         Medical Service: Internal Medicine Teaching Service         Attending Physician: Dr. Sid Falcon, MD    First Contact: Dr. Randell Patient Pager: [phone]  Second Contact: Dr. Aundra Dubin Pager: [phone]       After Hours (After 5p/  First Contact Pager: [phone]  weekends / holidays): Second Contact Pager: [phone]   Chief Complaint: Abdominal pain, pus/blood from colostomy  History of Present Illness: [name] Bell is a 68 year old woman with history of duodenal ulcer, ischemic colitis in 2009, perforated bowel s/p sigmoid colectomy with colostomy 07/30/2011 presenting with abdominal pain, pus/blood from colostomy. She reports the abdominal pain is medial to her ostomy and feels like pulling. It has been worsening over the past month. She has noticed a parastomal hernia x 1 year.  She also has a chronic tear near her ostomy x 2 years. Her home health nurse has been putting duoderm on the tear. The tear has been having blood and pus per her home health nurse.  She reports nausea and emesis x 2 prior to arrival. She denies fevers/chills, chest pain, changes in shortness of breath, cough, hemetemesis, dysuria, edema. Her output from her ostomy has been formed but she has noted decreased output for the past week. No changes in gas from the ostomy.   Meds: Medications Prior to Admission  Medication Sig Dispense Refill  . albuterol-ipratropium (COMBIVENT) 18-103 MCG/ACT inhaler Inhale 2 puffs into the lungs every 4 (four) hours as needed for wheezing.  1 Inhaler  3  . baclofen (LIORESAL) 5 mg TABS tablet Take 0.5 tablets (5 mg total) by mouth 3 (three) times daily as needed for muscle spasms.  45 tablet  2  . buPROPion (WELLBUTRIN) 100 MG tablet Take 1 tablet (100 mg total) by mouth 4 (four) times daily.  120 tablet  11  . calcium-vitamin D (OSCAL WITH D  500-200) 500-200 MG-UNIT per tablet Take 1 tablet by mouth 3 (three) times daily.        . cholecalciferol (VITAMIN D) 1000 UNITS tablet Take 1,000 Units by mouth 3 (three) times daily.      Marland Kitchen docusate sodium (COLACE) 100 MG capsule Take 100 mg by mouth daily as needed for constipation.       Marland Kitchen HYDROcodone-acetaminophen (NORCO) 7.5-325 MG per tablet Take 1 tablet by mouth every 8 (eight) hours as needed for moderate pain.  150 tablet  0  . lidocaine (LIDODERM) 5 % Place 1 patch onto the skin every 12 (twelve) hours. Remove & Discard patch within 12 hours or as directed by MD  10 patch  3  . mometasone (NASONEX) 50 MCG/ACT nasal spray Place 2 sprays into the nose daily.  17 g  2  . morphine (MS CONTIN) 15 MG 12 hr tablet Take 2 tablets (30 mg total) by mouth every 12 (twelve) hours.  120 tablet  0  . NEXIUM 40 MG capsule TAKE 1 CAPSULE BY MOUTH TWICE DAILY  180 capsule  3  . pravastatin (PRAVACHOL) 80 MG tablet Take 1 tablet (80 mg total) by mouth daily.  30 tablet  11  . promethazine (PHENERGAN) 12.5 MG tablet Take 1 tablet (12.5 mg total) by mouth every 6 (six) hours as  needed for nausea or vomiting.  90 tablet  1    Allergies: Allergies as of 10/11/2013 - Review Complete 10/11/2013  Allergen Reaction Noted  . Ibuprofen    . Latex Itching 07/29/2011  . Nsaids     Past Medical History  Diagnosis Date  . Superior mesenteric artery syndrome 11/08    sp dilation by Dr. Watt Climes, Pt may require bowel resetion if sx recur  . Duodenal ulcer 11/08    With hemorrhage and obstruction  . Osteomyelitis, jaw acute 11/08    Started while in the hospital abcess showed GNR . SP debridement by Dr. Lilli Few,  Priscella Mann S Bovis sp 4  weeks of Pen V started on 05/19/07  with additional 2 weeks in 07/04/07  . Degenerative disk disease     This is interscapular, mild compression frx of T12 superior endplate with Schmorl's node. Seen on CT in 2/08  . Back pain   . Depression   . Acute sinusitis 06/30/2011  . Ischemic  colitis 07/30/2011    2009 by endoscopy   . Perforated bowel     surgery july 2013   Past Surgical History  Procedure Laterality Date  . Abdominal hysterectomy    . Colostomy  07/30/2011    Procedure: COLOSTOMY;  Surgeon: Adin Hector, MD;  Location: [facility];  Service: General;  Laterality: Left;  . Lysis of adhesions  07/30/2011  . Sigmoid colectomy  07/30/2011  . Transrectal drainage of pelvic abscess  07/30/2011   Family History  Problem Relation Age of Onset  . Cancer Mother   . Heart disease Father   . Diabetes Sister    History   Social History  . Marital Status: Divorced    Spouse Name: N/A    Number of Children: N/A  . Years of Education: N/A   Occupational History  . Not on file.   Social History Main Topics  . Smoking status: Former Smoker -- 0.20 packs/day for 20 years    Types: Cigarettes    Quit date: 05/26/2012  . Smokeless tobacco: Not on file  . Alcohol Use: No  . Drug Use: No  . Sexual Activity: Not on file   Other Topics Concern  . Not on file   Social History Narrative   Pt now lives by herself in an ALF [facility]) where she can come and go as she pleases.  A good friend named Truman Hayward still looks out for her daily.     Review of Systems: Constitutional: no fevers/chills Eyes: no vision changes Ears, nose, mouth, throat, and face: no cough Respiratory: +chronic SOB Cardiovascular: no chest pain Gastrointestinal: +nausea/vomiting, +abdominal pain, no constipation, no diarrhea Genitourinary: no dysuria, no hematuria Integument: no rash Hematologic/lymphatic: +bleeding, no bruising, no edema Musculoskeletal: no arthralgias, no myalgias Neurological: no paresthesias, no weakness   Physical Exam: Blood pressure 121/69, pulse 94, temperature 98.3 F (36.8 C), temperature source Oral, resp. rate 16, height 5\' 4"  (1.626 m), weight 156 lb (70.761 kg), last menstrual period 08/25/1980, SpO2 95.00%. General Apperance: NAD Head: Normocephalic,  atraumatic Eyes: PERRL, EOMI, anicteric sclera Ears: Normal external ear canal Nose: Nares normal, septum midline, mucosa normal Throat: Lips, mucosa and tongue normal  Neck: Supple, trachea midline Back: No tenderness or bony abnormality  Lungs: Clear to auscultation bilaterally. No wheezes, rhonchi or rales. Breathing comfortably on RA Chest Wall: Nontender, no deformity Heart: Regular rate and rhythm, no murmur/rub/gallop Abdomen: +BS, soft, tender to palpation medial to ostomy, fullness medial to  ostomy-nonreducible, no overlying skin changes, nondistended. Ostomy in LLQ with brown stool, no blood or pus visualized. Extremities: Normal, atraumatic, warm and well perfused, no edema Pulses: 2+ throughout Skin: No rashes or lesions Neurologic: Alert and oriented x 3. CNII-XII intact. Normal strength and sensation  Lab results: Basic Metabolic Panel:  Recent Labs  10/11/13 1700  NA 140  K 3.9  CL 102  CO2 22  GLUCOSE 97  BUN 10  CREATININE 0.69  CALCIUM 9.2   Liver Function Tests:  Recent Labs  10/11/13 1700  AST 21  ALT 12  ALKPHOS 110  BILITOT <0.2*  PROT 6.9  ALBUMIN 3.4*   CBC:  Recent Labs  10/11/13 1700  WBC 9.4  NEUTROABS 5.7  HGB 14.0  HCT 41.4  MCV 87.3  PLT 296   Urinalysis:  Recent Labs  10/11/13 1640  COLORURINE YELLOW  LABSPEC 1.013  PHURINE 5.0  GLUCOSEU NEG  HGBUR NEG  BILIRUBINUR NEG  KETONESUR NEG  PROTEINUR NEG  UROBILINOGEN 0.2  NITRITE NEG  LEUKOCYTESUR NEG   Assessment & Plan by Problem: Principal Problem:   Abdominal pain, unspecified site Active Problems:   TOBACCO ABUSE   Chronic pain syndrome   Colostomy in place   Colostomy complication   Mild malnutrition   Abdominal pain  Abdominal pain: Pain overlying what is likely a parastomal hernia. She had nausea/vomiting and decreased output from her ostomy. Concern for obstruction. Unlikely to be infectious process as she is afebrile with no leukocytosis. Cannot  rule out GI bleed although no blood visualized on exam in stool and pt reports that blood usually comes from skin tear adjacent to ostomy. She does have a history of duodenal ulcer. Hgb stable and BUN wnl. -Wound care consult -CT Abd/Pelvis with contrast -NPO pending results of CT scan -FOBT -continue PPI 40mg  Pantoprazole BID -Phenergan prn nausea  Chronic pain -bacofen 5mg  TID prn spasm -Norco 7.5/325mg  Q8hr prn -MS contin 30mg  Q12hr  COPD: on combivent at home -Duoneb 41mL Q4hr prn wheezing  Depression -Welbutrin 100mg  QID  DVT ppx: lovenox 40mg  daily  Dispo: Disposition is deferred at this time, awaiting improvement of current medical problems. Anticipated discharge in approximately 2 day(s).   The patient does have a current PCP (Albin Felling, MD) and does need an [facility] hospital follow-up appointment after discharge.  The patient does not have transportation limitations that hinder transportation to clinic appointments.  Signed: Jacques Earthly, MD 10/11/2013, 6:24 PM

## 2013-10-11 NOTE — Progress Notes (Signed)
   Subjective:    Patient ID: [name] Bell, female    DOB: 04/15/1945, 68 y.o.   MRN: [id number]  HPI [name] Bell is a 68 year old woman with PMH of macular degeneration with decreased vision, COPD, ischemic colitis, diverticulitis w colon perforation s/p colostomy in 07/2011, presenting with increasing colostomy pain. She states that her [facility] who changes her colostomy bag has been telling her for the past 2 weeks that there has been blood and pus in the colostomy bag with inflammation around the colostomy bag. The patient does not see well enough to tell if she has had blood or pus from the bag but notes that she has had increased pain in the past few days and decreased output. The pain is described as dull to sharp, radiating to her rectum and vagina. She denies dysuria. She has been tolerating intake per mouth well until today when she started feeling nauseate and had one episode of emesis.  She denies fever but tells me that she never develops fevers.    Review of Systems  Constitutional: Negative for fever, chills, diaphoresis, activity change, appetite change, fatigue and unexpected weight change.  Eyes:       Blindness due to macular degeneration  Respiratory: Negative for cough, shortness of breath and wheezing.   Cardiovascular: Negative for chest pain, palpitations and leg swelling.  Gastrointestinal: Positive for nausea and vomiting. Negative for diarrhea.       Pain around the colostomy  Genitourinary: Negative for dysuria, frequency and difficulty urinating.  Neurological: Negative for dizziness and light-headedness.  Psychiatric/Behavioral: Negative for agitation.       Objective:   Physical Exam  Nursing note and vitals reviewed. Constitutional: She is oriented to person, place, and time. She appears well-developed and well-nourished.  Mild distress due to pain  Cardiovascular: Normal rate and regular rhythm.   Pulmonary/Chest: Effort normal and breath sounds normal.  No respiratory distress. She has no wheezes. She has no rales. She exhibits no tenderness.  Abdominal: Soft. She exhibits no distension and no mass. There is tenderness. There is no rebound and no guarding.  Hypoactive bowel sounds.  TTP around colostomy  Pink ostomy with brown liquid stool in bag, no blood or pus  Musculoskeletal: She exhibits no edema and no tenderness.  Neurological: She is alert and oriented to person, place, and time. Coordination normal.  Skin: Skin is warm and dry. She is not diaphoretic.  Psychiatric:  Tearful           Assessment & Plan:

## 2013-10-11 NOTE — Progress Notes (Signed)
Report called to [place], RN on [place].  Patient transported via wheelchair to [place].  Patient alert and oriented.  Sander Nephew, RN 10/12/2014 4:54 PM.

## 2013-10-11 NOTE — Progress Notes (Signed)
Internal Medicine Night Float Interim Progress Note  S: Called by nursing at 2030 to evaluate rash on patients R arm and neck associated with pain.  Nurse concerned this may be shingles given patient's reported history of shingles in the past.  Patient says she had shingles on her R arm several years ago, and she has noticed pain in her R arm for the last four days with some associated itching.  She says the pain is mostly in her arm, but occasionally extends to her neck.  O: Filed Vitals:   10/11/13 1721  BP: 121/69  Pulse: 94  Temp: 98.3 F (36.8 C)  Resp: 16   Physical Exam  Constitutional: She is well-developed, well-nourished, and in no distress. No distress.  Abdominal:  Large abdominal hernia surrounding ostomy site.  Erythematous and painful ostomy with new dressing in place.  Skin:  Scarce excoriation on R arm, R back and L neck (see photos).  Surgical scar over R elbow.      A/P: Excoriation in multiple dermatomes on body, low suspicion for shingles.  Likely eczema.  History of R arm surgery and chronic neck and arm pain from multilevel spondylosis and facet arthrosis. -Continue home MS contin 30 mg q12h, Norco 7.5-325 mg q8h PRN. -Ordered Ribaxin 750 mg q6h, previously on this as outpatient but switched to baclofen because she couldn't afford.  She says Ribaxin works better for her. -0.5% hydrocortisone cream to R arm TID PRN.

## 2013-10-11 NOTE — Assessment & Plan Note (Addendum)
She has acute on chronic colostomy pain. She reports that her [facility] RN has noted blood and pus in the bag though it appears to have nl stool content here.  Will admit to [facility] for further evaluation with CT abd/pelvis, IV fluid, and pain control. Will need Wound Care RN for colostomy care.  -Ordered STAT CMP, CBC, urinalysis    Discussed with [facility] attending, Dr. Beryle Beams who also examined the patient.

## 2013-10-12 ENCOUNTER — Observation Stay (HOSPITAL_COMMUNITY): Payer: Medicare Other

## 2013-10-12 ENCOUNTER — Encounter (HOSPITAL_COMMUNITY): Payer: Self-pay

## 2013-10-12 DIAGNOSIS — F329 Major depressive disorder, single episode, unspecified: Secondary | ICD-10-CM

## 2013-10-12 DIAGNOSIS — K5900 Constipation, unspecified: Secondary | ICD-10-CM

## 2013-10-12 DIAGNOSIS — G8929 Other chronic pain: Secondary | ICD-10-CM

## 2013-10-12 DIAGNOSIS — J449 Chronic obstructive pulmonary disease, unspecified: Secondary | ICD-10-CM

## 2013-10-12 DIAGNOSIS — R109 Unspecified abdominal pain: Secondary | ICD-10-CM | POA: Diagnosis not present

## 2013-10-12 DIAGNOSIS — G894 Chronic pain syndrome: Secondary | ICD-10-CM | POA: Diagnosis not present

## 2013-10-12 DIAGNOSIS — F3289 Other specified depressive episodes: Secondary | ICD-10-CM

## 2013-10-12 DIAGNOSIS — R112 Nausea with vomiting, unspecified: Secondary | ICD-10-CM | POA: Diagnosis not present

## 2013-10-12 DIAGNOSIS — E441 Mild protein-calorie malnutrition: Secondary | ICD-10-CM | POA: Diagnosis not present

## 2013-10-12 DIAGNOSIS — IMO0002 Reserved for concepts with insufficient information to code with codable children: Secondary | ICD-10-CM | POA: Diagnosis not present

## 2013-10-12 LAB — CBC WITH DIFFERENTIAL/PLATELET
Basophils Absolute: 0 10*3/uL (ref 0.0–0.1)
Basophils Relative: 0 % (ref 0–1)
EOS ABS: 0.1 10*3/uL (ref 0.0–0.7)
EOS PCT: 1 % (ref 0–5)
HEMATOCRIT: 38.8 % (ref 36.0–46.0)
HEMOGLOBIN: 12.9 g/dL (ref 12.0–15.0)
Lymphocytes Relative: 38 % (ref 12–46)
Lymphs Abs: 2.3 10*3/uL (ref 0.7–4.0)
MCH: 29.1 pg (ref 26.0–34.0)
MCHC: 33.2 g/dL (ref 30.0–36.0)
MCV: 87.6 fL (ref 78.0–100.0)
MONOS PCT: 8 % (ref 3–12)
Monocytes Absolute: 0.5 10*3/uL (ref 0.1–1.0)
Neutro Abs: 3.2 10*3/uL (ref 1.7–7.7)
Neutrophils Relative %: 53 % (ref 43–77)
Platelets: 263 10*3/uL (ref 150–400)
RBC: 4.43 MIL/uL (ref 3.87–5.11)
RDW: 15.1 % (ref 11.5–15.5)
WBC: 6 10*3/uL (ref 4.0–10.5)

## 2013-10-12 LAB — LACTIC ACID, PLASMA: Lactic Acid, Venous: 0.6 mmol/L (ref 0.5–2.2)

## 2013-10-12 MED ORDER — ONDANSETRON HCL 4 MG/2ML IJ SOLN
4.0000 mg | Freq: Four times a day (QID) | INTRAMUSCULAR | Status: DC | PRN
  Administered 2013-10-12 – 2013-10-13 (×2): 4 mg via INTRAVENOUS
  Filled 2013-10-12 (×2): qty 2

## 2013-10-12 MED ORDER — HYDROCODONE-ACETAMINOPHEN 7.5-325 MG PO TABS
1.0000 | ORAL_TABLET | Freq: Four times a day (QID) | ORAL | Status: DC | PRN
  Administered 2013-10-12 – 2013-10-13 (×5): 1 via ORAL
  Filled 2013-10-12 (×5): qty 1

## 2013-10-12 MED ORDER — FLEET ENEMA 7-19 GM/118ML RE ENEM
1.0000 | ENEMA | Freq: Once | RECTAL | Status: AC
  Administered 2013-10-13: 11:00:00 via RECTAL
  Filled 2013-10-12: qty 1

## 2013-10-12 MED ORDER — POLYETHYLENE GLYCOL 3350 17 G PO PACK
17.0000 g | PACK | Freq: Every day | ORAL | Status: DC
  Administered 2013-10-12 – 2013-10-13 (×2): 17 g via ORAL
  Filled 2013-10-12 (×2): qty 1

## 2013-10-12 MED ORDER — ACETAMINOPHEN 325 MG PO TABS
650.0000 mg | ORAL_TABLET | Freq: Once | ORAL | Status: DC
  Filled 2013-10-12: qty 2

## 2013-10-12 MED ORDER — MAGNESIUM CITRATE PO SOLN
1.0000 | Freq: Once | ORAL | Status: AC
Start: 2013-10-12 — End: 2013-10-12
  Administered 2013-10-12: 1 via ORAL
  Filled 2013-10-12: qty 296

## 2013-10-12 MED ORDER — ONDANSETRON HCL 4 MG PO TABS
4.0000 mg | ORAL_TABLET | Freq: Four times a day (QID) | ORAL | Status: DC | PRN
  Administered 2013-10-13 (×2): 4 mg via ORAL
  Filled 2013-10-12 (×2): qty 1

## 2013-10-12 NOTE — Consult Note (Signed)
Agree with above, think she is constipated and needs to be on bowel regimen.  She can be seen in office electively if she wants to discuss repairing the parastomal hernia although not mandatory

## 2013-10-12 NOTE — Progress Notes (Signed)
Subjective: Ms. Powell reports her abdominal pain is stable. She denies emesis but has some nausea. She denies fevers, chills, chest pain, shortness of breath.  Objective: Vital signs in last 24 hours: Filed Vitals:   10/11/13 1721 10/11/13 2247 10/12/13 0522 10/12/13 1300  BP: 121/69 120/62 117/44 120/43  Pulse: 94 79 77 90  Temp: 98.3 F (36.8 C) 97.6 F (36.4 C) 98.1 F (36.7 C) 97.8 F (36.6 C)  TempSrc: Oral Oral Oral Oral  Resp: 16 16 16 18   Height: 5\' 4"  (1.626 m)     Weight: 156 lb (70.761 kg)     SpO2: 95% 100% 94% 94%   Weight change:   Intake/Output Summary (Last 24 hours) at 10/12/13 1440 Last data filed at 10/12/13 1430  Gross per 24 hour  Intake    960 ml  Output   1402 ml  Net   -442 ml   General Apperance: NAD  Head: Normocephalic, atraumatic  Eyes: PERRL, EOMI, anicteric sclera  Ears: Normal external ear canal  Nose: Nares normal, septum midline, mucosa normal  Throat: Lips, mucosa and tongue normal  Neck: Supple, trachea midline  Back: No tenderness or bony abnormality  Lungs: Clear to auscultation bilaterally. No wheezes, rhonchi or rales. Breathing comfortably on RA  Chest Wall: Nontender, no deformity  Heart: Regular rate and rhythm, no murmur/rub/gallop  Abdomen: +BS, soft, tender to palpation medial to ostomy, fullness medial to ostomy-nonreducible, no overlying skin changes, nondistended. Ostomy in LLQ with brown stool, no blood or pus visualized.  Extremities: Normal, atraumatic, warm and well perfused, no edema  Pulses: 2+ throughout  Skin: No rashes or lesions  Neurologic: Alert and oriented x 3. CNII-XII intact. Normal strength and sensation  Lab Results: Basic Metabolic Panel:  Recent Labs Lab 10/11/13 1700  NA 140  K 3.9  CL 102  CO2 22  GLUCOSE 97  BUN 10  CREATININE 0.69  CALCIUM 9.2   Liver Function Tests:  Recent Labs Lab 10/11/13 1700  AST 21  ALT 12  ALKPHOS 110  BILITOT <0.2*  PROT 6.9  ALBUMIN 3.4*    CBC:  Recent Labs Lab 10/11/13 1700 10/12/13 0417  WBC 9.4 6.0  NEUTROABS 5.7 3.2  HGB 14.0 12.9  HCT 41.4 38.8  MCV 87.3 87.6  PLT 296 263   Urinalysis:  Recent Labs Lab 10/11/13 1640  COLORURINE YELLOW  LABSPEC 1.013  PHURINE 5.0  GLUCOSEU NEG  HGBUR NEG  BILIRUBINUR NEG  KETONESUR NEG  PROTEINUR NEG  UROBILINOGEN 0.2  NITRITE NEG  LEUKOCYTESUR NEG   Misc. Labs: Lactic acid 0.6  Micro Results: No results found for this or any previous visit (from the past 240 hour(s)). Studies/Results: Ct Abdomen Pelvis Wo Contrast  10/12/2013   CLINICAL DATA:  Pain and swelling at the site of colostomy, with nausea and vomiting. Blood arising from the ostomy site.  EXAM: CT ABDOMEN AND PELVIS WITHOUT CONTRAST  TECHNIQUE: Multidetector CT imaging of the abdomen and pelvis was performed following the standard protocol without IV contrast.  COMPARISON:  CT of the abdomen and pelvis from 09/29/2012  FINDINGS: Mild bibasilar atelectasis or scarring is noted.  The liver and spleen are unremarkable in appearance. The gallbladder is within normal limits. The pancreas and adrenal glands are unremarkable.  The kidneys are unremarkable in appearance. There is no evidence of hydronephrosis. No renal or ureteral stones are seen. No perinephric stranding is appreciated.  No free fluid is identified. The small bowel is unremarkable in  appearance. The stomach is within normal limits. No acute vascular abnormalities are seen. Mild scattered calcification is noted along the abdominal aorta and its branches.  The appendix is not characterized; there is no evidence for appendicitis. Contrast progresses to the level of the ascending colon.  The colon is partially filled with dense stool. There is herniation of the distal transverse colon into the colostomy. The herniated loop is partially filled with stool, but is otherwise unremarkable. There is mild soft tissue inflammation with regard to the sigmoid colon  at the level of the colostomy.  The Jeanette Caprice pouch is grossly unremarkable in appearance.  The bladder is mildly distended and grossly unremarkable. The patient is status post hysterectomy. No suspicious adnexal masses are seen. No inguinal lymphadenopathy is seen.  No acute osseous abnormalities are identified. Vacuum phenomenon is noted at L5-S1.  IMPRESSION: 1. Herniation of the distal transverse colon into the colostomy. The herniated loop is partially filled with dense stool, and the remainder of the colon also contains dense stool. Mild soft tissue inflammation noted with regard to the sigmoid colon at the level of the colostomy. The swelling at the ostomy site likely reflects the herniated loop with dense stool, while the blood may reflect mild irritation at the sigmoid colon from dense stool. 2. Mild scattered calcification along the abdominal aorta and its branches. 3. Mild bibasilar atelectasis or scarring noted.   Electronically Signed   By: Garald Balding M.D.   On: 10/12/2013 03:46   Medications: I have reviewed the patient's current medications. Scheduled Meds: . buPROPion  100 mg Oral QID  . calcium-vitamin D  1 tablet Oral TID  . cholecalciferol  1,000 Units Oral TID  . enoxaparin (LOVENOX) injection  40 mg Subcutaneous Q24H  . morphine  30 mg Oral Q12H  . pantoprazole  40 mg Oral BID  . polyethylene glycol  17 g Oral Daily  . simvastatin  40 mg Oral q1800  . sodium phosphate  1 enema Rectal Once   Continuous Infusions:  PRN Meds:.docusate sodium, HYDROcodone-acetaminophen, hydrocortisone cream, ipratropium-albuterol, methocarbamol, promethazine Assessment/Plan: Principal Problem:   Abdominal pain, unspecified site Active Problems:   TOBACCO ABUSE   Chronic pain syndrome   Colostomy in place   Colostomy complication   Mild malnutrition   Abdominal pain  Constipation: CT a/p demonstrates herniation of the distal transverse colon into the colostomy. The herniated loop is  partially filled with dense stool, and the remainder of the colon also contains dense stool. Mild soft tissue inflammation noted with regard to the sigmoid colon at the level of the colostomy. General surgery consulted: likely severe constipation, no surgical intervention. Per [facility] full thickness ulcer 3X2X1cm located as a result of pressure beneath pouch from hernia. -CLD -FOBT  -fleet enema through her colostomy today, magnesium citrate solution, and daily miralax -continue PPI 40mg  Pantoprazole BID  -Phenergan prn nausea   Chronic pain  -bacofen 5mg  TID prn spasm  -Norco 7.5/325mg  Q6hr prn  -MS contin 30mg  Q12hr   COPD: on combivent at home  -Duoneb 67mL Q4hr prn wheezing   Depression  -Welbutrin 100mg  QID   DVT ppx: lovenox 40mg  daily  Dispo: Disposition is deferred at this time, awaiting improvement of current medical problems.  Anticipated discharge in approximately 1 day(s).   The patient does have a current PCP (Albin Felling, MD) and does need an [facility] hospital follow-up appointment after discharge.  The patient does not have transportation limitations that hinder transportation to clinic appointments.  .Services  Needed at time of discharge: Y = Yes, Blank = No PT:   OT:   RN:   Equipment:   Other:     LOS: 1 day   Jacques Earthly, MD 10/12/2013, 2:40 PM
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
## 2013-10-12 NOTE — Progress Notes (Signed)
Medicine attending: I personally interviewed and fully examined this patient together with resident physician Dr. Hayes Ludwig and I concur with her evaluation and management plan. This is a 68 year old woman who is 2 years status post emergency surgery for a perforated diverticulum requiring a colostomy. She now presents with a three-week history of increasing pain localized to her stoma area. Over the last 24-hours she has had intermittent vomiting. She denied any fever or chills and is afebrile today. She has macular degeneration and requires daily visits by home nursing service to manage her colostomy. She was referred for evaluation when nurses reported purulent discharge at the stoma site. On my exam, I did not remove the colostomy bag. There does not seem to be any significant inflammatory change at the colostomy site. Area of moderate induration at 12:00. The abdomen is soft, nontender, decreased bowel sounds. No guarding or rebound tenderness. No localized high-pitched bowel sounds to suggest obstruction. Patient is concerned because she had a previous stoma infection which did not present with fever and she feels that her current symptoms are similar to past symptoms which resulted in hospitalization and antibiotic therapy. Although she does not appear to be acutely ill, the persistence and progression of her symptoms is of concern. I think it is reasonable to admit her to the hospital for observation and parenteral antibiotics and appropriate arrangements will be made. Murriel Hopper, M.D., [place]

## 2013-10-12 NOTE — Consult Note (Addendum)
[facility] ostomy consult note Reason for Consult: Ostomy consult requested; colostomy has been in place since 2013.  Pt states she has been having progressively worse problems with abd pain inside abd near ostomy and sometimes leaks foul smelling drainage and mucous from rectum and vagina.   Colostomy stoma red and viable, slightly above skin level, 1 1/4 inches. Peristomal skin intact surrounding except to left upper corner of barrier; full thickness ulcer 3X2X.1cm located as a result of pressure beneath pouch from hernia.  100% red and moist, no odor, small amt pink drainage.  Pt has home health assistance and they have been applying hydrofiber and hydrocolloid to protect skin and promote healing.  Agree with this plan of care.   Applied dressings as noted above and barrier ring to maintain seal, then 1 piece flexible pouch with clamp.    CT scan indicates large mass of stool which has not paseds by the hernia location.  Pt could benefit from a daily stool softener.  Stool in the pouch is also hard brown round pellets; no blood noted at this time. Pt could also benefit from follow-up with [facility] team since she reports that she has been having drainage from rectum or vaginal areas and continuous pain.  Please re-consult if further assistance is needed.  Thank-you,  Julien Girt MSN, [place], [place], [place], [place]

## 2013-10-12 NOTE — Addendum Note (Signed)
Addended by: Adele Barthel D on: 10/12/2013 08:34 AM   Modules accepted: Level of Service

## 2013-10-12 NOTE — Progress Notes (Signed)
  Date: 10/12/2013  Patient name: NAYELLIE SANSEVERINO  Medical record number: [id number]  Date of birth: Sep 20, 1945   I have seen and evaluated Carron Brazen and discussed their care with the Residency Team.  Briefly, Ms. Camilli is a 68yo woamn with PMH of duodenal ulcer, ischemic colitis and perforated bowel with colectomy and colostomy in 2013.  She presented to clinic with abdominal pain and pus/blood from colostomy.  She has had a parastomal hernia for about a year along with a chronic tear near her ostomy that her wound care nurse has been managing.  The tear has been having blood and pus drain from it.  She also reports nausea and emesis prior to arrival.  She had a CT scan which showed large stool burden in colon and hernia into the stoma of the transverse colon. Surgery has been consulted and recommended a vigorous bowel regimen and no need for surgery to correct hernia.    Assessment and Plan: I have seen and evaluated the patient as outlined above. I agree with the formulated Assessment and Plan as detailed in the residents' admission note, with the following changes:   1. Acute abdominal pain, parastomal hernia, skin tear - Surgery consult noted, will plan for good bowel regimen - Wound care for stoma and skin tear care - Pain regimen, limit narcotics given decreasing bowel transit - Advance diet slowly given nausea, emesis - PPI, phenergan for nausea - OOB to chair as tolerated  2. Chronic pain - Agree with continuing regimen as per Dr. Marjory Sneddon H&P  Other issues per resident note.   Sid Falcon, MD 9/17/20151:57 PM

## 2013-10-13 DIAGNOSIS — IMO0002 Reserved for concepts with insufficient information to code with codable children: Secondary | ICD-10-CM | POA: Diagnosis not present

## 2013-10-13 DIAGNOSIS — F329 Major depressive disorder, single episode, unspecified: Secondary | ICD-10-CM | POA: Diagnosis not present

## 2013-10-13 DIAGNOSIS — G894 Chronic pain syndrome: Secondary | ICD-10-CM | POA: Diagnosis not present

## 2013-10-13 DIAGNOSIS — E441 Mild protein-calorie malnutrition: Secondary | ICD-10-CM | POA: Diagnosis not present

## 2013-10-13 DIAGNOSIS — R109 Unspecified abdominal pain: Secondary | ICD-10-CM | POA: Diagnosis not present

## 2013-10-13 DIAGNOSIS — G8929 Other chronic pain: Secondary | ICD-10-CM | POA: Diagnosis not present

## 2013-10-13 DIAGNOSIS — K5900 Constipation, unspecified: Secondary | ICD-10-CM | POA: Diagnosis not present

## 2013-10-13 DIAGNOSIS — F3289 Other specified depressive episodes: Secondary | ICD-10-CM | POA: Diagnosis not present

## 2013-10-13 DIAGNOSIS — J449 Chronic obstructive pulmonary disease, unspecified: Secondary | ICD-10-CM | POA: Diagnosis not present

## 2013-10-13 LAB — CALCIUM, IONIZED: Calcium, Ion: 1.19 mmol/L (ref 1.13–1.30)

## 2013-10-13 MED ORDER — HYDROCODONE-ACETAMINOPHEN 7.5-325 MG PO TABS: 1.0000 | ORAL_TABLET | Freq: Three times a day (TID) | ORAL | Status: DC | PRN

## 2013-10-13 MED ORDER — SENNA 8.6 MG PO TABS: 2.0000 | ORAL_TABLET | Freq: Every day | ORAL | Status: DC

## 2013-10-13 MED ORDER — POLYETHYLENE GLYCOL 3350 17 G PO PACK: 17.0000 g | PACK | Freq: Every day | ORAL | Status: DC

## 2013-10-13 MED ORDER — HYDROCODONE-ACETAMINOPHEN 7.5-325 MG PO TABS: 1.0000 | ORAL_TABLET | Freq: Four times a day (QID) | ORAL | Status: DC | PRN

## 2013-10-13 NOTE — Discharge Instructions (Addendum)
Constipation Constipation is when a person:  Has a hard time pooping.  Has poop that is dry, hard, or bigger than normal. HOME CARE   Eat foods with a lot of fiber in them. This includes fruits, vegetables, beans, and whole grains such as brown rice.  Avoid fatty foods and foods with a lot of sugar. This includes french fries, hamburgers, cookies, candy, and soda.  If you are not getting enough fiber from food, take products with added fiber in them (supplements).  Drink enough fluid to keep your pee (urine) clear or pale yellow.  Exercise on a regular basis, or as told by your doctor.  Go to the restroom when you feel like you need to poop. Do not hold it.  Only take medicine as told by your doctor. Do not take medicines that help you poop (laxatives) without talking to your doctor first. GET HELP RIGHT AWAY IF:   You have bright red blood in your poop (stool).  Your constipation lasts more than 4 days or gets worse.  You have belly (abdominal) pain.  You have thin poop (as thin as a pencil).  You lose weight, and it cannot be explained. MAKE SURE YOU:   Understand these instructions.  Will watch your condition.  Will get help right away if you are not doing well or get worse. Document Released: 07/01/2007 Document Revised: 01/17/2013 Document Reviewed: 10/24/2012 [facility] Patient Information 2015 [place], [place]. This information is not intended to replace advice given to you by your health care provider. Make sure you discuss any questions you have with your health care provider.

## 2013-10-13 NOTE — Progress Notes (Signed)
Patient ID: [name] Bell, female   DOB: 08-17-1945, 68 y.o.   MRN: [id number]    Subjective: Pt feels much better today.  Having multiple episodes of hard stool in ostomy bag, now more soft.  Less tender  Objective: Vital signs in last 24 hours: Temp:  [97.8 F (36.6 C)-98.5 F (36.9 C)] 98.3 F (36.8 C) (09/18 0540) Pulse Rate:  [90-100] 95 (09/18 0540) Resp:  [15-18] 15 (09/18 0540) BP: (113-120)/(35-43) 113/35 mmHg (09/18 0540) SpO2:  [93 %-94 %] 93 % (09/18 0540) Last BM Date: 10/12/13  Intake/Output from previous day: 09/17 0701 - 09/18 0700 In: 1182 [P.O.:1182] Out: 302 [Urine:302] Intake/Output this shift:    PE: Abd: softer, parastomal hernia much softer, bag with thick output, much softer.  Lab Results:   Recent Labs  10/11/13 1700 10/12/13 0417  WBC 9.4 6.0  HGB 14.0 12.9  HCT 41.4 38.8  PLT 296 263   BMET  Recent Labs  10/11/13 1700  NA 140  K 3.9  CL 102  CO2 22  GLUCOSE 97  BUN 10  CREATININE 0.69  CALCIUM 9.2   PT/INR No results found for this basename: LABPROT, INR,  in the last 72 hours CMP     Component Value Date/Time   NA 140 10/11/2013 1700   K 3.9 10/11/2013 1700   CL 102 10/11/2013 1700   CO2 22 10/11/2013 1700   GLUCOSE 97 10/11/2013 1700   BUN 10 10/11/2013 1700   CREATININE 0.69 10/11/2013 1700   CREATININE 0.60 10/02/2012 0542   CALCIUM 9.2 10/11/2013 1700   PROT 6.9 10/11/2013 1700   ALBUMIN 3.4* 10/11/2013 1700   AST 21 10/11/2013 1700   ALT 12 10/11/2013 1700   ALKPHOS 110 10/11/2013 1700   BILITOT <0.2* 10/11/2013 1700   GFRNONAA 88 10/11/2013 1700   GFRNONAA >90 10/02/2012 0542   GFRAA >89 10/11/2013 1700   GFRAA >90 10/02/2012 0542   Lipase     Component Value Date/Time   LIPASE 23 09/28/2012 2203       Studies/Results: Ct Abdomen Pelvis Wo Contrast  10/12/2013   CLINICAL DATA:  Pain and swelling at the site of colostomy, with nausea and vomiting. Blood arising from the ostomy site.  EXAM: CT ABDOMEN AND PELVIS WITHOUT  CONTRAST  TECHNIQUE: Multidetector CT imaging of the abdomen and pelvis was performed following the standard protocol without IV contrast.  COMPARISON:  CT of the abdomen and pelvis from 09/29/2012  FINDINGS: Mild bibasilar atelectasis or scarring is noted.  The liver and spleen are unremarkable in appearance. The gallbladder is within normal limits. The pancreas and adrenal glands are unremarkable.  The kidneys are unremarkable in appearance. There is no evidence of hydronephrosis. No renal or ureteral stones are seen. No perinephric stranding is appreciated.  No free fluid is identified. The small bowel is unremarkable in appearance. The stomach is within normal limits. No acute vascular abnormalities are seen. Mild scattered calcification is noted along the abdominal aorta and its branches.  The appendix is not characterized; there is no evidence for appendicitis. Contrast progresses to the level of the ascending colon.  The colon is partially filled with dense stool. There is herniation of the distal transverse colon into the colostomy. The herniated loop is partially filled with stool, but is otherwise unremarkable. There is mild soft tissue inflammation with regard to the sigmoid colon at the level of the colostomy.  The Jeanette Caprice pouch is grossly unremarkable in appearance.  The bladder is  mildly distended and grossly unremarkable. The patient is status post hysterectomy. No suspicious adnexal masses are seen. No inguinal lymphadenopathy is seen.  No acute osseous abnormalities are identified. Vacuum phenomenon is noted at L5-S1.  IMPRESSION: 1. Herniation of the distal transverse colon into the colostomy. The herniated loop is partially filled with dense stool, and the remainder of the colon also contains dense stool. Mild soft tissue inflammation noted with regard to the sigmoid colon at the level of the colostomy. The swelling at the ostomy site likely reflects the herniated loop with dense stool, while the  blood may reflect mild irritation at the sigmoid colon from dense stool. 2. Mild scattered calcification along the abdominal aorta and its branches. 3. Mild bibasilar atelectasis or scarring noted.   Electronically Signed   By: Garald Balding M.D.   On: 10/12/2013 03:46    Anti-infectives: Anti-infectives   None       Assessment/Plan  1. Parastomal hernia 2. Constipation  Plan: 1. Patient doing better today. No need for repair of hernia.  She needs to be on aggressive daily bowel regimen with miralax and stool softeners.  No further surgical needs.  Her diet can be advanced and further care per primary service.  We will sign off.   LOS: 2 days    Boniface Goffe E 10/13/2013, 9:20 AM Pager: [phone]

## 2013-10-13 NOTE — Progress Notes (Signed)
  Date: 10/13/2013  Patient name: [name] Bell  Medical record number: [id number]  Date of birth: 03/01/1945   This patient has been seen and the plan of care was discussed with the house staff. Please see their note for complete details. I concur with their findings with the following additions/corrections:  Patient's abdominal pain much improved, parastomal hernia softer and not tender today.  She can be discharged with a good bowel regimen and good follow up.  Follow up ionized calcium outpatient.   Sid Falcon, MD 10/13/2013, 4:26 PM

## 2013-10-13 NOTE — Progress Notes (Signed)
Agree with with above.

## 2013-10-13 NOTE — Care Management Note (Signed)
  Page 1 of 1   10/13/2013     1:25:37 PM CARE MANAGEMENT NOTE 10/13/2013  Patient:  [name] Bell, [name] Bell   Account Number:  [id number]  Date Initiated:  10/13/2013  Documentation initiated by:  Magdalen Spatz  Subjective/Objective Assessment:     Action/Plan:   Anticipated DC Date:  10/13/2013   Anticipated DC Plan:  [place]         Choice offered to / List presented to:  C-1 Patient        [place] arranged  HH-1 RN  [place].   Status of service:  Completed, signed off Medicare Important Message given?   (If response is "NO", the following Medicare IM given date fields will be blank) Date Medicare IM given:   Medicare IM given by:   Date Additional Medicare IM given:   Additional Medicare IM given by:    Discharge Disposition:  [place]  Per UR Regulation:  Reviewed for med. necessity/level of care/duration of stay  If discussed at [place] of Stay Meetings, dates discussed:    Comments:  10-13-13 Confirmed face sheet information with patient. She has a HHRN currently through [facility] changes colostomy bag every Monday . Patient also has a home health aide through [facility] Monday through Friday 2 hours a day. Patient would like to stay with [facility] . Bedside nurse has changed colostomy bag this am and is sending patient home with extra bags .  Magdalen Spatz RN BSN

## 2013-10-13 NOTE — Evaluation (Signed)
Physical Therapy Evaluation Patient Details Name: [name] Bell MRN: [id number] DOB: [birth year] Today's Date: 10/13/2013   History of Present Illness  Pt. admittted on 10/11/13 with n/v, abdominal pain and pus/blood from colostomy.  Pt. thought to have constipation, parastomal hernia.  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  Pt. alow with significan visual impairment from macular degeneration and receives services from " [facility]".    Clinical Impression  Pt admitted with th above. Pt currently with functional limitations due to the deficits listed below (see PT Problem List).  Pt will benefit from skilled PT to increase her independence and safety with mobility to allow discharge to the venue listed below.   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   Suggest HHPT evaluation for home safety and maximum independence with any necessary modifications.      Follow Up Recommendations Home health PT;Supervision - Intermittent    Equipment Recommendations  None recommended by PT    Recommendations for Other Services       Precautions / Restrictions Precautions Precautions: Fall Precaution Comments: ; pt. reports occasional fall and thinks this is due to her visual impairment (trips over items) Restrictions Weight Bearing Restrictions: No      Mobility  Bed Mobility Overal bed mobility: Modified Independent             General bed mobility comments: minimal use of bed rail   Transfers Overall transfer level: Modified independent Equipment used: None             General transfer comment: uses hands to push off of bed ; ambulated into bathroom for urination and washing hands at sink with supervision and verbal direction for locating soap dispenser  Ambulation/Gait Ambulation/Gait  assistance: Min guard;Supervision Ambulation Distance (Feet): 75 Feet Assistive device: None Gait Pattern/deviations: Step-through pattern Gait velocity: decreased   General Gait Details: pt. initially with slight sway toward left due to generalized weakness (reports she hasnt eaten in several days and hasn't been OOB much).  After a few steps, pt. became more stable and occsionally reached for railing in the hallway as she "furniture walks" at home due to visual impairment.  Pt. could had tolerated a longer distance however she had received an enema and was fearful of getting too far from her room.    Stairs            Wheelchair Mobility    Modified Rankin (Stroke Patients Only)       Balance Overall balance assessment: Needs assistance Sitting-balance support: No upper extremity supported;Feet supported Sitting balance-Leahy Scale: Good     Standing balance support: No upper extremity supported;During functional activity Standing balance-Leahy Scale: Fair                               Pertinent Vitals/Pain Pain Assessment: 0-10 Pain Score: 7  Pain Location: low back and right shoulder Pain Descriptors / Indicators: Aching;Sore Pain Intervention(s): Repositioned;Heat applied (pt. has heating pad  currently; pt. reports recent pain med)    Home Living Family/patient expects to be discharged to:: Private residence Living Arrangements: Alone Available Help at Discharge: Home health;Other (Comment);Neighbor [facility] every Monday, aide daily for 2 hours, 5x/wk) Type of Home: [place] Access: Ramped entrance  Home Layout: One level Home Equipment: [place] - 4 wheels;Other (comment);Shower seat;Grab bars - toilet;Grab bars - tub/shower;Hand held shower head (white cane for visually impaired) Additional Comments: Pt. states she does not use an assistive device in the home and does not feel comfortable using her white cane for visually impaired.       Prior Function Level of Independence: Needs assistance   Gait / Transfers Assistance Needed: independent with walking in her apartment; aide accompanies pt. for outdoor walking  ADL's / Homemaking Assistance Needed: assist for heavier housework (by aide)        Hand Dominance        Extremity/Trunk Assessment   Upper Extremity Assessment: Generalized weakness           Lower Extremity Assessment: Generalized weakness         Communication   Communication: No difficulties  Cognition Arousal/Alertness: Awake/alert Behavior During Therapy: WFL for tasks assessed/performed Overall Cognitive Status: Within Functional Limits for tasks assessed                      General Comments      Exercises        Assessment/Plan    PT Assessment Patient needs continued PT services  PT Diagnosis Difficulty walking;Generalized weakness;Other (comment)   PT Problem List Decreased strength;Decreased activity tolerance;Decreased balance;Decreased mobility;Decreased knowledge of use of DME;Pain;Other (comment) (visual impairment limiting activity)  PT Treatment Interventions Gait training;Functional mobility training;Therapeutic activities;Therapeutic exercise;Balance training;Patient/family education   PT Goals (Current goals can be found in the Care Plan section) Acute Rehab PT Goals Patient Stated Goal: home with assist from her aide PT Goal Formulation: With patient Time For Goal Achievement: 10/20/13 Potential to Achieve Goals: Good    Frequency Min 3X/week   Barriers to discharge Decreased caregiver support pt. has an aide 2 hours per day/ 5 days per week.  Pt. also has a neighbor who gets pt. to her appointments and cooks some for her.  Friend Cookie will be checking in.  Pt. has recently received a talking clock and is very pleased that she can now distinguish what time it is.  She reports she is due to get a  talking watch soon.  Have discussed pt's current  status and services with Case manager Heather Wile    Co-evaluation               End of Session Equipment Utilized During Treatment: Gait belt Activity Tolerance: Patient tolerated treatment well;Patient limited by fatigue Patient left: in bed;with call bell/phone within reach;Other (comment) (in bed at her request) Nurse Communication: Mobility status    Functional Assessment Tool Used: clinicla judgement and observation Functional Limitation: Mobility: Walking and moving around Mobility: Walking and Moving Around Current Status (U8280): At least 1 percent but less than 20 percent impaired, limited or restricted Mobility: Walking and Moving Around Goal Status [phone]): At least 1 percent but less than 20 percent impaired, limited or restricted    Time: 1155-1234 PT Time Calculation (min): 39 min   Charges:   PT Evaluation $Initial PT Evaluation Tier I: 1 Procedure PT Treatments $Gait Training: 8-22 mins $Therapeutic Activity: 8-22 mins   PT G Codes:   Functional Assessment Tool Used: clinicla judgement and observation Functional Limitation: Mobility: Walking and moving around    Ladona Ridgel 10/13/2013, 1:06 PM Gerlean Ren PT Acute Rehab Services [phone] [place] [phone]

## 2013-10-13 NOTE — Progress Notes (Signed)
Subjective: Ms. Therriault reports her abdominal pain has improved. She has had increased output from her ostomy and it is softer. She reports her chronic back pain is bothering her.   Objective: Vital signs in last 24 hours: Filed Vitals:   10/12/13 1300 10/12/13 2106 10/12/13 2232 10/13/13 0540  BP: 120/43 115/40 114/43 113/35  Pulse: 90 95 100 95  Temp: 97.8 F (36.6 C) 98.2 F (36.8 C) 98.5 F (36.9 C) 98.3 F (36.8 C)  TempSrc: Oral Oral Oral Oral  Resp: 18 16 16 15   Height:      Weight:      SpO2: 94% 94% 93% 93%   Weight change:   Intake/Output Summary (Last 24 hours) at 10/13/13 1258 Last data filed at 10/13/13 0900  Gross per 24 hour  Intake    702 ml  Output      1 ml  Net    701 ml   General Apperance: NAD  Head: Normocephalic, atraumatic  Eyes: PERRL, EOMI, anicteric sclera  Ears: Normal external ear canal  Nose: Nares normal, septum midline, mucosa normal  Throat: Lips, mucosa and tongue normal  Neck: Supple, trachea midline  Back: No tenderness or bony abnormality  Lungs: Clear to auscultation bilaterally. No wheezes, rhonchi or rales. Breathing comfortably on RA  Chest Wall: Nontender, no deformity  Heart: Regular rate and rhythm, no murmur/rub/gallop  Abdomen: +BS, soft, minimally tender to palpation, decreased fullness adjacent to ostomy, nondistended. Ostomy in LLQ with brown stool, no blood or pus visualized.  Extremities: Normal, atraumatic, warm and well perfused, no edema  Pulses: 2+ throughout  Skin: No rashes or lesions  Neurologic: Alert and oriented x 3. CNII-XII intact. Normal strength and sensation  Lab Results: Basic Metabolic Panel:  Recent Labs Lab 10/11/13 1700  NA 140  K 3.9  CL 102  CO2 22  GLUCOSE 97  BUN 10  CREATININE 0.69  CALCIUM 9.2   Liver Function Tests:  Recent Labs Lab 10/11/13 1700  AST 21  ALT 12  ALKPHOS 110  BILITOT <0.2*  PROT 6.9  ALBUMIN 3.4*   CBC:  Recent Labs Lab 10/11/13 1700  10/12/13 0417  WBC 9.4 6.0  NEUTROABS 5.7 3.2  HGB 14.0 12.9  HCT 41.4 38.8  MCV 87.3 87.6  PLT 296 263   Urinalysis:  Recent Labs Lab 10/11/13 1640  COLORURINE YELLOW  LABSPEC 1.013  PHURINE 5.0  GLUCOSEU NEG  HGBUR NEG  BILIRUBINUR NEG  KETONESUR NEG  PROTEINUR NEG  UROBILINOGEN 0.2  NITRITE NEG  LEUKOCYTESUR NEG   Misc. Labs: Lactic acid 0.6  Micro Results: No results found for this or any previous visit (from the past 240 hour(s)). Studies/Results: Ct Abdomen Pelvis Wo Contrast  10/12/2013   CLINICAL DATA:  Pain and swelling at the site of colostomy, with nausea and vomiting. Blood arising from the ostomy site.  EXAM: CT ABDOMEN AND PELVIS WITHOUT CONTRAST  TECHNIQUE: Multidetector CT imaging of the abdomen and pelvis was performed following the standard protocol without IV contrast.  COMPARISON:  CT of the abdomen and pelvis from 09/29/2012  FINDINGS: Mild bibasilar atelectasis or scarring is noted.  The liver and spleen are unremarkable in appearance. The gallbladder is within normal limits. The pancreas and adrenal glands are unremarkable.  The kidneys are unremarkable in appearance. There is no evidence of hydronephrosis. No renal or ureteral stones are seen. No perinephric stranding is appreciated.  No free fluid is identified. The small bowel is unremarkable in appearance.  The stomach is within normal limits. No acute vascular abnormalities are seen. Mild scattered calcification is noted along the abdominal aorta and its branches.  The appendix is not characterized; there is no evidence for appendicitis. Contrast progresses to the level of the ascending colon.  The colon is partially filled with dense stool. There is herniation of the distal transverse colon into the colostomy. The herniated loop is partially filled with stool, but is otherwise unremarkable. There is mild soft tissue inflammation with regard to the sigmoid colon at the level of the colostomy.  The Jeanette Caprice  pouch is grossly unremarkable in appearance.  The bladder is mildly distended and grossly unremarkable. The patient is status post hysterectomy. No suspicious adnexal masses are seen. No inguinal lymphadenopathy is seen.  No acute osseous abnormalities are identified. Vacuum phenomenon is noted at L5-S1.  IMPRESSION: 1. Herniation of the distal transverse colon into the colostomy. The herniated loop is partially filled with dense stool, and the remainder of the colon also contains dense stool. Mild soft tissue inflammation noted with regard to the sigmoid colon at the level of the colostomy. The swelling at the ostomy site likely reflects the herniated loop with dense stool, while the blood may reflect mild irritation at the sigmoid colon from dense stool. 2. Mild scattered calcification along the abdominal aorta and its branches. 3. Mild bibasilar atelectasis or scarring noted.   Electronically Signed   By: Garald Balding M.D.   On: 10/12/2013 03:46   Medications: I have reviewed the patient's current medications. Scheduled Meds: . acetaminophen  650 mg Oral Once  . buPROPion  100 mg Oral QID  . calcium-vitamin D  1 tablet Oral TID  . cholecalciferol  1,000 Units Oral TID  . enoxaparin (LOVENOX) injection  40 mg Subcutaneous Q24H  . morphine  30 mg Oral Q12H  . pantoprazole  40 mg Oral BID  . polyethylene glycol  17 g Oral Daily  . simvastatin  40 mg Oral q1800   Continuous Infusions:  PRN Meds:.docusate sodium, HYDROcodone-acetaminophen, hydrocortisone cream, ipratropium-albuterol, methocarbamol, ondansetron (ZOFRAN) IV, ondansetron Assessment/Plan: Principal Problem:   Abdominal pain, unspecified site Active Problems:   TOBACCO ABUSE   Chronic pain syndrome   Colostomy in place   Colostomy complication   Mild malnutrition   Abdominal pain  Constipation: CT a/p demonstrates herniation of the distal transverse colon into the colostomy. The herniated loop is partially filled with dense  stool, and the remainder of the colon also contains dense stool. Mild soft tissue inflammation noted with regard to the sigmoid colon at the level of the colostomy. General surgery consulted: likely severe constipation, no surgical intervention. Per WOC full thickness ulcer 3X2X1cm located as a result of pressure beneath pouch from hernia. Constipation improving today -continue daily miralax -docusate 100mg  daily prn -senna 2 tab QHS -continue PPI 40mg  Pantoprazole BID  -Phenergan prn nausea  -Advance to regular diet -check ionized calcium, will consider decreasing calcium supplementation  Chronic pain  -Norco 7.5/325mg  Q6hr prn  -MS contin 30mg  Q12hr  -Robaxin 750mg  Q6hr prn spasm -PT/OT  COPD: on combivent at home  -Duoneb 17mL Q4hr prn wheezing   Depression  -Welbutrin 100mg  QID   DVT ppx: lovenox 40mg  daily  Dispo: likely home today  The patient does have a current PCP (Albin Felling, MD) and does need an [facility] hospital follow-up appointment after discharge.  The patient does not have transportation limitations that hinder transportation to clinic appointments.  .Services Needed at time of discharge:  Y = Yes, Blank = No PT:   OT:   RN: Home Health  Equipment:   Other:     LOS: 2 days   Jacques Earthly, MD 10/13/2013, 12:58 PM

## 2013-10-14 DIAGNOSIS — K9400 Colostomy complication, unspecified: Secondary | ICD-10-CM | POA: Diagnosis not present

## 2013-10-14 DIAGNOSIS — G894 Chronic pain syndrome: Secondary | ICD-10-CM | POA: Diagnosis not present

## 2013-10-14 DIAGNOSIS — J441 Chronic obstructive pulmonary disease with (acute) exacerbation: Secondary | ICD-10-CM | POA: Diagnosis not present

## 2013-10-14 DIAGNOSIS — F329 Major depressive disorder, single episode, unspecified: Secondary | ICD-10-CM | POA: Diagnosis not present

## 2013-10-14 DIAGNOSIS — Z433 Encounter for attention to colostomy: Secondary | ICD-10-CM | POA: Diagnosis not present

## 2013-10-14 DIAGNOSIS — H3530 Unspecified macular degeneration: Secondary | ICD-10-CM | POA: Diagnosis not present

## 2013-10-15 NOTE — Discharge Summary (Signed)
Name: [name] Bell MRN: [id number] DOB: 1945/05/29 68 y.o. PCP: [name] Felling, MD  Date of Admission: 10/11/2013  5:10 PM Date of Discharge: 10/13/2013 Attending Physician: [name] Chiquito, MD  Discharge Diagnosis: Principal Problem:   Abdominal pain, unspecified site Active Problems:   TOBACCO ABUSE   Chronic pain syndrome   Colostomy in place   Colostomy complication   Mild malnutrition   Abdominal pain  Discharge Medications:   Medication List    STOP taking these medications       calcium-vitamin D 500-200 MG-UNIT per tablet  Commonly known as:  OSCAL WITH D      TAKE these medications       albuterol-ipratropium 18-103 MCG/ACT inhaler  Commonly known as:  COMBIVENT  Inhale 2 puffs into the lungs every 4 (four) hours as needed for wheezing.     baclofen 5 mg Tabs tablet  Commonly known as:  LIORESAL  Take 5 mg by mouth 3 (three) times daily as needed for muscle spasms.     buPROPion 100 MG tablet  Commonly known as:  WELLBUTRIN  Take 100 mg by mouth 4 (four) times daily.     cholecalciferol 1000 UNITS tablet  Commonly known as:  VITAMIN D  Take 1,000 Units by mouth daily.     esomeprazole 40 MG capsule  Commonly known as:  NEXIUM  Take 40 mg by mouth 2 (two) times daily before a meal.     HYDROcodone-acetaminophen 7.5-325 MG per tablet  Commonly known as:  NORCO  Take 1 tablet by mouth every 8 (eight) hours as needed for moderate pain.     lidocaine 5 %  Commonly known as:  LIDODERM  Place 1 patch onto the skin every 12 (twelve) hours. Remove & Discard patch within 12 hours or as directed by MD     methocarbamol 500 MG tablet  Commonly known as:  ROBAXIN  Take 500 mg by mouth every 6 (six) hours as needed for muscle spasms.     mometasone 50 MCG/ACT nasal spray  Commonly known as:  NASONEX  Place 1 spray into the nose daily.     morphine 15 MG 12 hr tablet  Commonly known as:  MS CONTIN  Take 30 mg by mouth every 12 (twelve) hours.     polyethylene glycol packet  Commonly known as:  MIRALAX / GLYCOLAX  Take 17 g by mouth daily.     pravastatin 80 MG tablet  Commonly known as:  PRAVACHOL  Take 1 tablet (80 mg total) by mouth daily.     promethazine 12.5 MG tablet  Commonly known as:  PHENERGAN  Take 12.5 mg by mouth every 6 (six) hours as needed for nausea or vomiting.     senna 8.6 MG Tabs tablet  Commonly known as:  SENOKOT  Take 2 tablets (17.2 mg total) by mouth at bedtime.        Disposition and follow-up:   [name] was discharged from [facility] in Stable condition.  At the hospital follow up visit please address:  1.  Constipation - assess patient's compliance to regimen and stool output. Consider reducing patients calcium supplementation. Also, please evaluate her pain meds - she may need to be established with a pain clinic.  2.  Labs / imaging needed at time of follow-up: none  3.  Pending labs/ test needing follow-up: ionized calcium  Follow-up Appointments:     Follow-up Information   Call [name] Bookbinder, MD. (As  needed for consideration of parastomal hernia repair)    Specialty:  General Surgery   Contact information:   [address] [place] [address] [phone]       Follow up with [name] Pais, MD On 10/19/2013. (09:45 AM for hospital follow up)    Specialty:  Internal Medicine   Contact information:   [place] [address] [phone]       Discharge Instructions: Discharge Instructions   Call MD for:  difficulty breathing, headache or visual disturbances    Complete by:  As directed      Call MD for:  persistant dizziness or light-headedness    Complete by:  As directed      Call MD for:  persistant nausea and vomiting    Complete by:  As directed      Call MD for:  severe uncontrolled pain    Complete by:  As directed      Call MD for:  temperature >100.4    Complete by:  As directed      Diet - low  sodium heart healthy    Complete by:  As directed      Increase activity slowly    Complete by:  As directed            Consultations: General Surgery  Procedures Performed:  Ct Abdomen Pelvis Wo Contrast  10/12/2013   CLINICAL DATA:  Pain and swelling at the site of colostomy, with nausea and vomiting. Blood arising from the ostomy site.  EXAM: CT ABDOMEN AND PELVIS WITHOUT CONTRAST  TECHNIQUE: Multidetector CT imaging of the abdomen and pelvis was performed following the standard protocol without IV contrast.  COMPARISON:  CT of the abdomen and pelvis from 09/29/2012  FINDINGS: Mild bibasilar atelectasis or scarring is noted.  The liver and spleen are unremarkable in appearance. The gallbladder is within normal limits. The pancreas and adrenal glands are unremarkable.  The kidneys are unremarkable in appearance. There is no evidence of hydronephrosis. No renal or ureteral stones are seen. No perinephric stranding is appreciated.  No free fluid is identified. The small bowel is unremarkable in appearance. The stomach is within normal limits. No acute vascular abnormalities are seen. Mild scattered calcification is noted along the abdominal aorta and its branches.  The appendix is not characterized; there is no evidence for appendicitis. Contrast progresses to the level of the ascending colon.  The colon is partially filled with dense stool. There is herniation of the distal transverse colon into the colostomy. The herniated loop is partially filled with stool, but is otherwise unremarkable. There is mild soft tissue inflammation with regard to the sigmoid colon at the level of the colostomy.  The Jeanette Caprice pouch is grossly unremarkable in appearance.  The bladder is mildly distended and grossly unremarkable. The patient is status post hysterectomy. No suspicious adnexal masses are seen. No inguinal lymphadenopathy is seen.  No acute osseous abnormalities are identified. Vacuum phenomenon is noted at  L5-S1.  IMPRESSION: 1. Herniation of the distal transverse colon into the colostomy. The herniated loop is partially filled with dense stool, and the remainder of the colon also contains dense stool. Mild soft tissue inflammation noted with regard to the sigmoid colon at the level of the colostomy. The swelling at the ostomy site likely reflects the herniated loop with dense stool, while the blood may reflect mild irritation at the sigmoid colon from dense stool. 2. Mild scattered calcification along the  abdominal aorta and its branches. 3. Mild bibasilar atelectasis or scarring noted.   Electronically Signed   By: Garald Balding M.D.   On: 10/12/2013 03:46    Admission HPI: Ms. Karapetian is a 68 year old woman with history of duodenal ulcer, ischemic colitis in 2009, perforated bowel s/p sigmoid colectomy with colostomy 07/30/2011 presenting with abdominal pain, pus/blood from colostomy. She reports the abdominal pain is medial to her ostomy and feels like pulling. It has been worsening over the past month. She has noticed a parastomal hernia x 1 year.   She also has a chronic tear near her ostomy x 2 years. Her home health nurse has been putting duoderm on the tear. The tear has been having blood and pus per her home health nurse.   She reports nausea and emesis x 2 prior to arrival. She denies fevers/chills, chest pain, changes in shortness of breath, cough, hemetemesis, dysuria, edema. Her output from her ostomy has been formed but she has noted decreased output for the past week. No changes in gas from the ostomy.    Hospital Course by problem list: Principal Problem:   Abdominal pain, unspecified site Active Problems:   TOBACCO ABUSE   Chronic pain syndrome   Colostomy in place   Colostomy complication   Mild malnutrition   Abdominal pain   1. Constipation: Pain overlying what is likely a parastomal hernia. She had nausea/vomiting and decreased output from her ostomy. Unlikely to be infectious  process as she is afebrile with no leukocytosis. No blood visualized on exam in stool and pt reports that blood usually comes from skin tear adjacent to ostomy. Hgb stable and BUN wnl. CT a/p demonstrates herniation of the distal transverse colon into the colostomy. The herniated loop is partially filled with dense stool, and the remainder of the colon also contains dense stool. Mild soft tissue inflammation noted with regard to the sigmoid colon at the level of the colostomy. General surgery consulted: likely severe constipation, no surgical intervention. Per WOC full thickness ulcer 3X2X1cm located as a result of pressure beneath pouch from hernia. Her PPI was continued, and she was given phenergan prn nausea. She received 1 bottle magnesium citrate and miralax with copious soft stool output from her ostomy. Her abdominal pain and fullness around her stoma improved. She also received a fleet enema through her colostomy. At discharge, she was tolerating a general diet with adequate stool output from her ostomy. She was placed on a regimen of Miralax daily and senna two tabs QHS. Her calcium supplementation was discontinued at discharge. Obtained an ionized calcium that needs to be followed at follow up for consideration of reducing her calcium supplementation as this can increase her constipation.  2. Chronic pain: She was continued on bacofen 5mg  TID prn spasm, Norco 7.5/325mg  Q8hr prn, and MS contin 30mg  Q12hr. She reports that she takes the [employer] at home and that the MS contin does not help her pain. Her regimen will need to be re-evaluated at follow up with consideration of referral to a pain clinic. She was seen by PT and will be receiving home PT.  3. COPD: on combivent at home. Continued Duoneb 10mL Q4hr prn wheezing. Remained stable.  4. Depression: stable and continued Welbutrin 100mg  QID.  Discharge Vitals:   BP 104/44  Pulse 90  Temp(Src) 98.1 F (36.7 C) (Oral)  Resp 16  Ht 5\' 4"   (1.626 m)  Wt 156 lb (70.761 kg)  BMI 26.76 kg/m2  SpO2 94%  LMP 08/25/1980 General Apperance: NAD  Head: Normocephalic, atraumatic  Eyes: PERRL, EOMI, anicteric sclera  Ears: Normal external ear canal  Nose: Nares normal, septum midline, mucosa normal  Throat: Lips, mucosa and tongue normal  Neck: Supple, trachea midline  Back: No tenderness or bony abnormality  Lungs: Clear to auscultation bilaterally. No wheezes, rhonchi or rales. Breathing comfortably on RA  Chest Wall: Nontender, no deformity  Heart: Regular rate and rhythm, no murmur/rub/gallop  Abdomen: +BS, soft, minimally tender to palpation, decreased fullness adjacent to ostomy, nondistended. Ostomy in LLQ with brown stool, no blood or pus visualized.  Extremities: Normal, atraumatic, warm and well perfused, no edema  Pulses: 2+ throughout  Skin: No rashes or lesions  Neurologic: Alert and oriented x 3. CNII-XII intact. Normal strength and sensation   Discharge Labs:  Basic Metabolic Panel:  Recent Labs  Lab  10/11/13 1700   NA  140   K  3.9   CL  102   CO2  22   GLUCOSE  97   BUN  10   CREATININE  0.69   CALCIUM  9.2    Liver Function Tests:  Recent Labs  Lab  10/11/13 1700   AST  21   ALT  12   ALKPHOS  110   BILITOT  <0.2*   PROT  6.9   ALBUMIN  3.4*    CBC:  Recent Labs  Lab  10/11/13 1700  10/12/13 0417   WBC  9.4  6.0   NEUTROABS  5.7  3.2   HGB  14.0  12.9   HCT  41.4  38.8   MCV  87.3  87.6   PLT  296  263    Urinalysis:  Recent Labs  Lab  10/11/13 1640   COLORURINE  YELLOW   LABSPEC  1.013   PHURINE  5.0   GLUCOSEU  NEG   HGBUR  NEG   BILIRUBINUR  NEG   KETONESUR  NEG   PROTEINUR  NEG   UROBILINOGEN  0.2   NITRITE  NEG   LEUKOCYTESUR  NEG    Misc. Labs:  Lactic acid 0.6  Signed: Jacques Earthly, MD 10/15/2013, 11:01 AM    Services Ordered on Discharge: Home health RN and [place] on Discharge: none

## 2013-10-16 DIAGNOSIS — J441 Chronic obstructive pulmonary disease with (acute) exacerbation: Secondary | ICD-10-CM | POA: Diagnosis not present

## 2013-10-16 DIAGNOSIS — K9400 Colostomy complication, unspecified: Secondary | ICD-10-CM | POA: Diagnosis not present

## 2013-10-16 DIAGNOSIS — Z433 Encounter for attention to colostomy: Secondary | ICD-10-CM | POA: Diagnosis not present

## 2013-10-16 DIAGNOSIS — G894 Chronic pain syndrome: Secondary | ICD-10-CM | POA: Diagnosis not present

## 2013-10-16 DIAGNOSIS — H3530 Unspecified macular degeneration: Secondary | ICD-10-CM | POA: Diagnosis not present

## 2013-10-16 DIAGNOSIS — F329 Major depressive disorder, single episode, unspecified: Secondary | ICD-10-CM | POA: Diagnosis not present

## 2013-10-17 ENCOUNTER — Telehealth: Payer: Self-pay | Admitting: *Deleted

## 2013-10-17 DIAGNOSIS — H3530 Unspecified macular degeneration: Secondary | ICD-10-CM | POA: Diagnosis not present

## 2013-10-17 DIAGNOSIS — F329 Major depressive disorder, single episode, unspecified: Secondary | ICD-10-CM | POA: Diagnosis not present

## 2013-10-17 DIAGNOSIS — Z433 Encounter for attention to colostomy: Secondary | ICD-10-CM | POA: Diagnosis not present

## 2013-10-17 DIAGNOSIS — J441 Chronic obstructive pulmonary disease with (acute) exacerbation: Secondary | ICD-10-CM | POA: Diagnosis not present

## 2013-10-17 DIAGNOSIS — K9400 Colostomy complication, unspecified: Secondary | ICD-10-CM | POA: Diagnosis not present

## 2013-10-17 DIAGNOSIS — G894 Chronic pain syndrome: Secondary | ICD-10-CM | POA: Diagnosis not present

## 2013-10-17 NOTE — Discharge Summary (Signed)
I did not see Ms. Lalli at time of discharge but assisted in the discharge planning.

## 2013-10-17 NOTE — Telephone Encounter (Signed)
Call from Shanon Brow, Physical Therapist with [facility] - #  [phone]  Therapist is asking for verbal orders for plan of care :  He will work on Chief Technology Officer, fall prevention and  home safety. 2 times a week for 3 weeks  I gave the verbal okay.  Is that okay with you?

## 2013-10-18 NOTE — Telephone Encounter (Signed)
Okay with Dr Arcelia Jew.

## 2013-10-19 ENCOUNTER — Encounter: Payer: Self-pay | Admitting: Internal Medicine

## 2013-10-19 ENCOUNTER — Ambulatory Visit (INDEPENDENT_AMBULATORY_CARE_PROVIDER_SITE_OTHER): Payer: Medicare Other | Admitting: Internal Medicine

## 2013-10-19 VITALS — BP 108/45 | HR 87 | Temp 98.0°F | Ht 64.0 in | Wt 154.3 lb

## 2013-10-19 DIAGNOSIS — F3289 Other specified depressive episodes: Secondary | ICD-10-CM

## 2013-10-19 DIAGNOSIS — F329 Major depressive disorder, single episode, unspecified: Secondary | ICD-10-CM

## 2013-10-19 DIAGNOSIS — G894 Chronic pain syndrome: Secondary | ICD-10-CM | POA: Diagnosis not present

## 2013-10-19 DIAGNOSIS — K9400 Colostomy complication, unspecified: Secondary | ICD-10-CM | POA: Diagnosis not present

## 2013-10-19 DIAGNOSIS — Z433 Encounter for attention to colostomy: Secondary | ICD-10-CM | POA: Diagnosis not present

## 2013-10-19 DIAGNOSIS — Z933 Colostomy status: Secondary | ICD-10-CM | POA: Diagnosis not present

## 2013-10-19 DIAGNOSIS — K219 Gastro-esophageal reflux disease without esophagitis: Secondary | ICD-10-CM

## 2013-10-19 DIAGNOSIS — R1013 Epigastric pain: Secondary | ICD-10-CM | POA: Insufficient documentation

## 2013-10-19 DIAGNOSIS — H3530 Unspecified macular degeneration: Secondary | ICD-10-CM | POA: Diagnosis not present

## 2013-10-19 DIAGNOSIS — J441 Chronic obstructive pulmonary disease with (acute) exacerbation: Secondary | ICD-10-CM | POA: Diagnosis not present

## 2013-10-19 DIAGNOSIS — F32A Depression, unspecified: Secondary | ICD-10-CM

## 2013-10-19 MED ORDER — BUPROPION HCL 100 MG PO TABS: 100.0000 mg | ORAL_TABLET | Freq: Three times a day (TID) | ORAL | Status: DC

## 2013-10-19 MED ORDER — HYDROCODONE-ACETAMINOPHEN 7.5-325 MG PO TABS: 1.0000 | ORAL_TABLET | Freq: Three times a day (TID) | ORAL | Status: DC | PRN

## 2013-10-19 MED ORDER — ESOMEPRAZOLE MAGNESIUM 40 MG PO CPDR: 40.0000 mg | DELAYED_RELEASE_CAPSULE | Freq: Two times a day (BID) | ORAL | Status: DC

## 2013-10-19 NOTE — Assessment & Plan Note (Addendum)
Refilled her Wellbutrin but reduced dose to 100mg  TID (max dose). She refused changing to different antidepressant and does not want counseling at this time, states that she feels depressed because of her decreased vision.

## 2013-10-19 NOTE — Assessment & Plan Note (Signed)
Resolved.  Continue sena and Miralax, encouraged increased water intake

## 2013-10-19 NOTE — Assessment & Plan Note (Signed)
With no TTP surrounding it, no pus/blood, pink ostomy.  Pt to continue changes 2/week with help of [facility] RN as she is not able to see well to change the bags herself.

## 2013-10-19 NOTE — Patient Instructions (Signed)
-  Stop taking vitamin D and calcium.  -Follow up in 1 month with Dr. Arcelia Jew for pain medication refill.   Please bring your medicines with you each time you come.   Medicines may be  Eye drops  Herbal   Vitamins  Pills  Seeing these help [place] take care of you.

## 2013-10-19 NOTE — Progress Notes (Signed)
   Subjective:    Patient ID: [name] Bell, female    DOB: [birth year], 68 y.o.   MRN: [id number]  HPI [name] Bell is a 68 year old woman with PMH of macular degeneration with decreased vision, COPD, ischemic colitis, diverticulitis w colon perforation s/p colostomy in 07/2011, presenting for HFU. She was hospitalized on 9/16 for increased abdominal pain around her colostomy and was found to have constipation which resolved with enema through the colostomy. She has been taking senokot and Miralax and is drinking 6 glasses of water with regular soft stools.  She needs refill for her Norco and she was given a Rx that could not be filled with instruction to be filled only after 11/04/13. She has run out of her Norco yesterday.  She has stopped taking calcium but still taking vitamin D supplementation.  Her HH RN continues to come to her house twice per week to change her colostomy bag.  As long she is taking her regular pain medications her abdominal pain is reported as at baseline.   Review of Systems  Constitutional: Negative for fever, chills, diaphoresis, activity change, appetite change, fatigue and unexpected weight change.  Gastrointestinal: Negative for nausea, vomiting, abdominal pain, diarrhea, constipation, blood in stool and abdominal distention.  Genitourinary: Negative for dysuria.  Neurological: Negative for dizziness, weakness and light-headedness.  Psychiatric/Behavioral: Negative for agitation.       Objective:   Physical Exam  Nursing note reviewed. Constitutional: She is oriented to person, place, and time. She appears well-developed and well-nourished. No distress.  Cardiovascular: Normal rate.   Pulmonary/Chest: Effort normal. No respiratory distress.  Abdominal: Soft. Bowel sounds are normal. She exhibits no distension and no mass. There is no tenderness. There is no rebound and no guarding.  Colostomy bag in place with brown stool content with pink ostomy, no TTP  surrounding it.   Neurological: She is alert and oriented to person, place, and time.  Skin: Skin is warm and dry. She is not diaphoretic.  Psychiatric: She has a normal mood and affect.          Assessment & Plan:

## 2013-10-19 NOTE — Assessment & Plan Note (Addendum)
Refilled Norco 7.5mg -325mg  q6h PRN pain #150. She was given only 15 tablets on 9/15 and has run out of this medication.  Her pain is at baseline as long as she is taking her medication.   Of note, Rx initially printed with incorrect fill instruction to be filled only after 11/04/13. This prescription was voided and discarded in the shredding locked bin.   She will follow up in 1 month for medication refill and for routine follow up visit.

## 2013-10-19 NOTE — Assessment & Plan Note (Signed)
Refilled her Nexium per her request

## 2013-10-20 DIAGNOSIS — K9400 Colostomy complication, unspecified: Secondary | ICD-10-CM | POA: Diagnosis not present

## 2013-10-20 DIAGNOSIS — J441 Chronic obstructive pulmonary disease with (acute) exacerbation: Secondary | ICD-10-CM | POA: Diagnosis not present

## 2013-10-20 DIAGNOSIS — Z433 Encounter for attention to colostomy: Secondary | ICD-10-CM | POA: Diagnosis not present

## 2013-10-20 DIAGNOSIS — F329 Major depressive disorder, single episode, unspecified: Secondary | ICD-10-CM | POA: Diagnosis not present

## 2013-10-20 DIAGNOSIS — H3530 Unspecified macular degeneration: Secondary | ICD-10-CM | POA: Diagnosis not present

## 2013-10-20 DIAGNOSIS — G894 Chronic pain syndrome: Secondary | ICD-10-CM | POA: Diagnosis not present

## 2013-10-23 DIAGNOSIS — K9400 Colostomy complication, unspecified: Secondary | ICD-10-CM | POA: Diagnosis not present

## 2013-10-23 DIAGNOSIS — J441 Chronic obstructive pulmonary disease with (acute) exacerbation: Secondary | ICD-10-CM | POA: Diagnosis not present

## 2013-10-23 DIAGNOSIS — H3530 Unspecified macular degeneration: Secondary | ICD-10-CM | POA: Diagnosis not present

## 2013-10-23 DIAGNOSIS — F329 Major depressive disorder, single episode, unspecified: Secondary | ICD-10-CM | POA: Diagnosis not present

## 2013-10-23 DIAGNOSIS — G894 Chronic pain syndrome: Secondary | ICD-10-CM | POA: Diagnosis not present

## 2013-10-23 DIAGNOSIS — Z433 Encounter for attention to colostomy: Secondary | ICD-10-CM | POA: Diagnosis not present

## 2013-10-23 NOTE — Progress Notes (Signed)
Medicine attending: Medical history, presenting problems, physical findings, and medications, reviewed with the medical resident Dr. Hayes Ludwig on the day of the patient's clinic visit and I concur with her evaluation and management plan. Murriel Hopper, M.D., [place]

## 2013-10-24 DIAGNOSIS — H3530 Unspecified macular degeneration: Secondary | ICD-10-CM | POA: Diagnosis not present

## 2013-10-24 DIAGNOSIS — J441 Chronic obstructive pulmonary disease with (acute) exacerbation: Secondary | ICD-10-CM | POA: Diagnosis not present

## 2013-10-24 DIAGNOSIS — G894 Chronic pain syndrome: Secondary | ICD-10-CM | POA: Diagnosis not present

## 2013-10-24 DIAGNOSIS — K9400 Colostomy complication, unspecified: Secondary | ICD-10-CM | POA: Diagnosis not present

## 2013-10-24 DIAGNOSIS — F329 Major depressive disorder, single episode, unspecified: Secondary | ICD-10-CM | POA: Diagnosis not present

## 2013-10-24 DIAGNOSIS — Z433 Encounter for attention to colostomy: Secondary | ICD-10-CM | POA: Diagnosis not present

## 2013-10-26 DIAGNOSIS — H3530 Unspecified macular degeneration: Secondary | ICD-10-CM | POA: Diagnosis not present

## 2013-10-26 DIAGNOSIS — K9400 Colostomy complication, unspecified: Secondary | ICD-10-CM | POA: Diagnosis not present

## 2013-10-26 DIAGNOSIS — Z433 Encounter for attention to colostomy: Secondary | ICD-10-CM | POA: Diagnosis not present

## 2013-10-26 DIAGNOSIS — G894 Chronic pain syndrome: Secondary | ICD-10-CM | POA: Diagnosis not present

## 2013-10-26 DIAGNOSIS — J441 Chronic obstructive pulmonary disease with (acute) exacerbation: Secondary | ICD-10-CM | POA: Diagnosis not present

## 2013-10-26 DIAGNOSIS — F329 Major depressive disorder, single episode, unspecified: Secondary | ICD-10-CM | POA: Diagnosis not present

## 2013-10-30 DIAGNOSIS — Z433 Encounter for attention to colostomy: Secondary | ICD-10-CM | POA: Diagnosis not present

## 2013-10-30 DIAGNOSIS — F329 Major depressive disorder, single episode, unspecified: Secondary | ICD-10-CM | POA: Diagnosis not present

## 2013-10-30 DIAGNOSIS — G894 Chronic pain syndrome: Secondary | ICD-10-CM | POA: Diagnosis not present

## 2013-10-30 DIAGNOSIS — J441 Chronic obstructive pulmonary disease with (acute) exacerbation: Secondary | ICD-10-CM | POA: Diagnosis not present

## 2013-10-30 DIAGNOSIS — H3530 Unspecified macular degeneration: Secondary | ICD-10-CM | POA: Diagnosis not present

## 2013-10-30 DIAGNOSIS — K9400 Colostomy complication, unspecified: Secondary | ICD-10-CM | POA: Diagnosis not present

## 2013-10-31 DIAGNOSIS — G894 Chronic pain syndrome: Secondary | ICD-10-CM | POA: Diagnosis not present

## 2013-10-31 DIAGNOSIS — Z433 Encounter for attention to colostomy: Secondary | ICD-10-CM | POA: Diagnosis not present

## 2013-10-31 DIAGNOSIS — J441 Chronic obstructive pulmonary disease with (acute) exacerbation: Secondary | ICD-10-CM | POA: Diagnosis not present

## 2013-10-31 DIAGNOSIS — K9400 Colostomy complication, unspecified: Secondary | ICD-10-CM | POA: Diagnosis not present

## 2013-10-31 DIAGNOSIS — H3530 Unspecified macular degeneration: Secondary | ICD-10-CM | POA: Diagnosis not present

## 2013-10-31 DIAGNOSIS — F329 Major depressive disorder, single episode, unspecified: Secondary | ICD-10-CM | POA: Diagnosis not present

## 2013-11-03 DIAGNOSIS — J441 Chronic obstructive pulmonary disease with (acute) exacerbation: Secondary | ICD-10-CM | POA: Diagnosis not present

## 2013-11-03 DIAGNOSIS — F329 Major depressive disorder, single episode, unspecified: Secondary | ICD-10-CM | POA: Diagnosis not present

## 2013-11-03 DIAGNOSIS — K9400 Colostomy complication, unspecified: Secondary | ICD-10-CM | POA: Diagnosis not present

## 2013-11-03 DIAGNOSIS — Z433 Encounter for attention to colostomy: Secondary | ICD-10-CM | POA: Diagnosis not present

## 2013-11-03 DIAGNOSIS — H3530 Unspecified macular degeneration: Secondary | ICD-10-CM | POA: Diagnosis not present

## 2013-11-03 DIAGNOSIS — G894 Chronic pain syndrome: Secondary | ICD-10-CM | POA: Diagnosis not present

## 2013-11-06 DIAGNOSIS — G894 Chronic pain syndrome: Secondary | ICD-10-CM | POA: Diagnosis not present

## 2013-11-06 DIAGNOSIS — K9400 Colostomy complication, unspecified: Secondary | ICD-10-CM | POA: Diagnosis not present

## 2013-11-06 DIAGNOSIS — J441 Chronic obstructive pulmonary disease with (acute) exacerbation: Secondary | ICD-10-CM | POA: Diagnosis not present

## 2013-11-06 DIAGNOSIS — Z433 Encounter for attention to colostomy: Secondary | ICD-10-CM | POA: Diagnosis not present

## 2013-11-06 DIAGNOSIS — F329 Major depressive disorder, single episode, unspecified: Secondary | ICD-10-CM | POA: Diagnosis not present

## 2013-11-06 DIAGNOSIS — H3530 Unspecified macular degeneration: Secondary | ICD-10-CM | POA: Diagnosis not present

## 2013-11-09 ENCOUNTER — Other Ambulatory Visit: Payer: Self-pay | Admitting: *Deleted

## 2013-11-10 ENCOUNTER — Other Ambulatory Visit: Payer: Self-pay | Admitting: *Deleted

## 2013-11-10 MED ORDER — IPRATROPIUM-ALBUTEROL 18-103 MCG/ACT IN AERO: 2.0000 | INHALATION_SPRAY | RESPIRATORY_TRACT | Status: DC | PRN

## 2013-11-11 MED ORDER — METHOCARBAMOL 500 MG PO TABS: 500.0000 mg | ORAL_TABLET | Freq: Four times a day (QID) | ORAL | Status: DC | PRN

## 2013-11-13 DIAGNOSIS — G894 Chronic pain syndrome: Secondary | ICD-10-CM | POA: Diagnosis not present

## 2013-11-13 DIAGNOSIS — H3530 Unspecified macular degeneration: Secondary | ICD-10-CM | POA: Diagnosis not present

## 2013-11-13 DIAGNOSIS — F329 Major depressive disorder, single episode, unspecified: Secondary | ICD-10-CM | POA: Diagnosis not present

## 2013-11-13 DIAGNOSIS — Z433 Encounter for attention to colostomy: Secondary | ICD-10-CM | POA: Diagnosis not present

## 2013-11-13 DIAGNOSIS — J441 Chronic obstructive pulmonary disease with (acute) exacerbation: Secondary | ICD-10-CM | POA: Diagnosis not present

## 2013-11-13 DIAGNOSIS — K9400 Colostomy complication, unspecified: Secondary | ICD-10-CM | POA: Diagnosis not present

## 2013-11-17 DIAGNOSIS — K9400 Colostomy complication, unspecified: Secondary | ICD-10-CM | POA: Diagnosis not present

## 2013-11-17 DIAGNOSIS — F329 Major depressive disorder, single episode, unspecified: Secondary | ICD-10-CM | POA: Diagnosis not present

## 2013-11-17 DIAGNOSIS — H3530 Unspecified macular degeneration: Secondary | ICD-10-CM | POA: Diagnosis not present

## 2013-11-17 DIAGNOSIS — J441 Chronic obstructive pulmonary disease with (acute) exacerbation: Secondary | ICD-10-CM | POA: Diagnosis not present

## 2013-11-17 DIAGNOSIS — G894 Chronic pain syndrome: Secondary | ICD-10-CM | POA: Diagnosis not present

## 2013-11-17 DIAGNOSIS — Z433 Encounter for attention to colostomy: Secondary | ICD-10-CM | POA: Diagnosis not present

## 2013-11-20 DIAGNOSIS — G894 Chronic pain syndrome: Secondary | ICD-10-CM | POA: Diagnosis not present

## 2013-11-20 DIAGNOSIS — K9400 Colostomy complication, unspecified: Secondary | ICD-10-CM | POA: Diagnosis not present

## 2013-11-20 DIAGNOSIS — H3530 Unspecified macular degeneration: Secondary | ICD-10-CM | POA: Diagnosis not present

## 2013-11-20 DIAGNOSIS — Z433 Encounter for attention to colostomy: Secondary | ICD-10-CM | POA: Diagnosis not present

## 2013-11-20 DIAGNOSIS — J441 Chronic obstructive pulmonary disease with (acute) exacerbation: Secondary | ICD-10-CM | POA: Diagnosis not present

## 2013-11-20 DIAGNOSIS — F329 Major depressive disorder, single episode, unspecified: Secondary | ICD-10-CM | POA: Diagnosis not present

## 2013-11-29 ENCOUNTER — Telehealth: Payer: Self-pay | Admitting: *Deleted

## 2013-11-29 DIAGNOSIS — G894 Chronic pain syndrome: Secondary | ICD-10-CM | POA: Diagnosis not present

## 2013-11-29 DIAGNOSIS — F329 Major depressive disorder, single episode, unspecified: Secondary | ICD-10-CM | POA: Diagnosis not present

## 2013-11-29 DIAGNOSIS — K9400 Colostomy complication, unspecified: Secondary | ICD-10-CM | POA: Diagnosis not present

## 2013-11-29 DIAGNOSIS — J441 Chronic obstructive pulmonary disease with (acute) exacerbation: Secondary | ICD-10-CM | POA: Diagnosis not present

## 2013-11-29 DIAGNOSIS — Z433 Encounter for attention to colostomy: Secondary | ICD-10-CM | POA: Diagnosis not present

## 2013-11-29 DIAGNOSIS — H3530 Unspecified macular degeneration: Secondary | ICD-10-CM | POA: Diagnosis not present

## 2013-11-29 NOTE — Telephone Encounter (Signed)
Call from [place], [place] with [facility] - # [phone] Nurse is asking for re certification orders for another 60 days for wound care and colostomy care. I gave the verbal order, is that okay with you?

## 2013-11-30 NOTE — Telephone Encounter (Signed)
OK with Dr Arcelia Jew.

## 2013-12-02 DIAGNOSIS — G894 Chronic pain syndrome: Secondary | ICD-10-CM | POA: Diagnosis not present

## 2013-12-02 DIAGNOSIS — D519 Vitamin B12 deficiency anemia, unspecified: Secondary | ICD-10-CM | POA: Diagnosis not present

## 2013-12-02 DIAGNOSIS — Z602 Problems related to living alone: Secondary | ICD-10-CM | POA: Diagnosis not present

## 2013-12-02 DIAGNOSIS — H3530 Unspecified macular degeneration: Secondary | ICD-10-CM | POA: Diagnosis not present

## 2013-12-02 DIAGNOSIS — Z720 Tobacco use: Secondary | ICD-10-CM | POA: Diagnosis not present

## 2013-12-02 DIAGNOSIS — L989 Disorder of the skin and subcutaneous tissue, unspecified: Secondary | ICD-10-CM | POA: Diagnosis not present

## 2013-12-02 DIAGNOSIS — K9400 Colostomy complication, unspecified: Secondary | ICD-10-CM | POA: Diagnosis not present

## 2013-12-02 DIAGNOSIS — Z433 Encounter for attention to colostomy: Secondary | ICD-10-CM | POA: Diagnosis not present

## 2013-12-02 DIAGNOSIS — F329 Major depressive disorder, single episode, unspecified: Secondary | ICD-10-CM | POA: Diagnosis not present

## 2013-12-02 DIAGNOSIS — J441 Chronic obstructive pulmonary disease with (acute) exacerbation: Secondary | ICD-10-CM | POA: Diagnosis not present

## 2013-12-04 DIAGNOSIS — K9400 Colostomy complication, unspecified: Secondary | ICD-10-CM | POA: Diagnosis not present

## 2013-12-04 DIAGNOSIS — F329 Major depressive disorder, single episode, unspecified: Secondary | ICD-10-CM | POA: Diagnosis not present

## 2013-12-04 DIAGNOSIS — Z433 Encounter for attention to colostomy: Secondary | ICD-10-CM | POA: Diagnosis not present

## 2013-12-04 DIAGNOSIS — J441 Chronic obstructive pulmonary disease with (acute) exacerbation: Secondary | ICD-10-CM | POA: Diagnosis not present

## 2013-12-04 DIAGNOSIS — H3530 Unspecified macular degeneration: Secondary | ICD-10-CM | POA: Diagnosis not present

## 2013-12-04 DIAGNOSIS — G894 Chronic pain syndrome: Secondary | ICD-10-CM | POA: Diagnosis not present

## 2013-12-06 ENCOUNTER — Other Ambulatory Visit: Payer: Self-pay | Admitting: *Deleted

## 2013-12-06 DIAGNOSIS — G894 Chronic pain syndrome: Secondary | ICD-10-CM

## 2013-12-07 ENCOUNTER — Other Ambulatory Visit: Payer: Self-pay | Admitting: Internal Medicine

## 2013-12-07 DIAGNOSIS — G894 Chronic pain syndrome: Secondary | ICD-10-CM

## 2013-12-07 MED ORDER — HYDROCODONE-ACETAMINOPHEN 7.5-325 MG PO TABS: 1.0000 | ORAL_TABLET | Freq: Three times a day (TID) | ORAL | Status: DC | PRN

## 2013-12-08 NOTE — Telephone Encounter (Signed)
Pt informed

## 2013-12-11 DIAGNOSIS — H3530 Unspecified macular degeneration: Secondary | ICD-10-CM | POA: Diagnosis not present

## 2013-12-11 DIAGNOSIS — F329 Major depressive disorder, single episode, unspecified: Secondary | ICD-10-CM | POA: Diagnosis not present

## 2013-12-11 DIAGNOSIS — Z433 Encounter for attention to colostomy: Secondary | ICD-10-CM | POA: Diagnosis not present

## 2013-12-11 DIAGNOSIS — K9400 Colostomy complication, unspecified: Secondary | ICD-10-CM | POA: Diagnosis not present

## 2013-12-11 DIAGNOSIS — G894 Chronic pain syndrome: Secondary | ICD-10-CM | POA: Diagnosis not present

## 2013-12-11 DIAGNOSIS — J441 Chronic obstructive pulmonary disease with (acute) exacerbation: Secondary | ICD-10-CM | POA: Diagnosis not present

## 2013-12-13 DIAGNOSIS — J441 Chronic obstructive pulmonary disease with (acute) exacerbation: Secondary | ICD-10-CM | POA: Diagnosis not present

## 2013-12-13 DIAGNOSIS — Z433 Encounter for attention to colostomy: Secondary | ICD-10-CM | POA: Diagnosis not present

## 2013-12-13 DIAGNOSIS — H3530 Unspecified macular degeneration: Secondary | ICD-10-CM | POA: Diagnosis not present

## 2013-12-13 DIAGNOSIS — K9400 Colostomy complication, unspecified: Secondary | ICD-10-CM | POA: Diagnosis not present

## 2013-12-13 DIAGNOSIS — G894 Chronic pain syndrome: Secondary | ICD-10-CM | POA: Diagnosis not present

## 2013-12-13 DIAGNOSIS — F329 Major depressive disorder, single episode, unspecified: Secondary | ICD-10-CM | POA: Diagnosis not present

## 2013-12-18 ENCOUNTER — Other Ambulatory Visit: Payer: Self-pay | Admitting: *Deleted

## 2013-12-18 DIAGNOSIS — G894 Chronic pain syndrome: Secondary | ICD-10-CM

## 2013-12-18 DIAGNOSIS — Z433 Encounter for attention to colostomy: Secondary | ICD-10-CM | POA: Diagnosis not present

## 2013-12-18 DIAGNOSIS — H3530 Unspecified macular degeneration: Secondary | ICD-10-CM | POA: Diagnosis not present

## 2013-12-18 DIAGNOSIS — J441 Chronic obstructive pulmonary disease with (acute) exacerbation: Secondary | ICD-10-CM | POA: Diagnosis not present

## 2013-12-18 DIAGNOSIS — K9400 Colostomy complication, unspecified: Secondary | ICD-10-CM | POA: Diagnosis not present

## 2013-12-18 DIAGNOSIS — F329 Major depressive disorder, single episode, unspecified: Secondary | ICD-10-CM | POA: Diagnosis not present

## 2013-12-19 MED ORDER — MORPHINE SULFATE ER 15 MG PO TBCR: 30.0000 mg | EXTENDED_RELEASE_TABLET | Freq: Two times a day (BID) | ORAL | Status: DC

## 2013-12-19 NOTE — Telephone Encounter (Signed)
Must have appt next 30 days [facility] or [facility] since dosage increased but not addressed F/U appt and contract not updated. Please document date of last MS Contin and oxycodone and get UDS - order is in.

## 2013-12-19 NOTE — Telephone Encounter (Signed)
Notified, when she arrives, last dose of mc contin? And oxycodone? Also do UDS

## 2013-12-20 ENCOUNTER — Other Ambulatory Visit: Payer: Medicare Other

## 2013-12-20 DIAGNOSIS — G894 Chronic pain syndrome: Secondary | ICD-10-CM | POA: Diagnosis not present

## 2013-12-20 NOTE — Telephone Encounter (Signed)
Pt picked up Rx _ UDS was done. Last dose of pain med today 11AM. Appt was made for Dec.

## 2013-12-21 LAB — PRESCRIPTION ABUSE MONITORING 15P, URINE
AMPHETAMINE/METH: NEGATIVE ng/mL
BARBITURATE SCREEN, URINE: NEGATIVE ng/mL
Benzodiazepine Screen, Urine: NEGATIVE ng/mL
Buprenorphine, Urine: NEGATIVE ng/mL
CARISOPRODOL, URINE: NEGATIVE ng/mL
CREATININE, URINE: 114.33 mg/dL (ref >20.0)
Cannabinoid Scrn, Ur: NEGATIVE ng/mL
Cocaine Metabolites: NEGATIVE ng/mL
FENTANYL URINE: NEGATIVE ng/mL
MEPERIDINE UR: NEGATIVE ng/mL
Methadone Screen, Urine: NEGATIVE ng/mL
Oxycodone Screen, Ur: NEGATIVE ng/mL
PROPOXYPHENE: NEGATIVE ng/mL
Tramadol Scrn, Ur: NEGATIVE ng/mL
Zolpidem, Urine: NEGATIVE ng/mL

## 2013-12-25 ENCOUNTER — Other Ambulatory Visit: Payer: Self-pay | Admitting: *Deleted

## 2013-12-25 DIAGNOSIS — J441 Chronic obstructive pulmonary disease with (acute) exacerbation: Secondary | ICD-10-CM | POA: Diagnosis not present

## 2013-12-25 DIAGNOSIS — F329 Major depressive disorder, single episode, unspecified: Secondary | ICD-10-CM | POA: Diagnosis not present

## 2013-12-25 DIAGNOSIS — Z433 Encounter for attention to colostomy: Secondary | ICD-10-CM | POA: Diagnosis not present

## 2013-12-25 DIAGNOSIS — H3530 Unspecified macular degeneration: Secondary | ICD-10-CM | POA: Diagnosis not present

## 2013-12-25 DIAGNOSIS — K9400 Colostomy complication, unspecified: Secondary | ICD-10-CM | POA: Diagnosis not present

## 2013-12-25 DIAGNOSIS — G894 Chronic pain syndrome: Secondary | ICD-10-CM | POA: Diagnosis not present

## 2013-12-25 NOTE — Telephone Encounter (Signed)
Pt has Norco, MS Contin, and lidocaine patch for pain control. She has an appointment in December to discuss pain regimen. Will not refill Robaxin at this time.

## 2013-12-26 LAB — OPIATES/OPIOIDS (LC/MS-MS)
Codeine Urine: NEGATIVE ng/mL (ref <50)
Hydrocodone: 4940 ng/mL — ABNORMAL HIGH (ref <50)
Hydromorphone: 238 ng/mL — ABNORMAL HIGH (ref <50)
Morphine Urine: 15580 ng/mL — ABNORMAL HIGH (ref <50)
NOROXYCODONE, UR: NEGATIVE ng/mL (ref <50)
Norhydrocodone, Ur: 6741 ng/mL — ABNORMAL HIGH (ref <50)
OXYCODONE, UR: NEGATIVE ng/mL (ref <50)
OXYMORPHONE, URINE: NEGATIVE ng/mL (ref <50)

## 2014-01-01 DIAGNOSIS — K9400 Colostomy complication, unspecified: Secondary | ICD-10-CM | POA: Diagnosis not present

## 2014-01-01 DIAGNOSIS — Z433 Encounter for attention to colostomy: Secondary | ICD-10-CM | POA: Diagnosis not present

## 2014-01-01 DIAGNOSIS — H3530 Unspecified macular degeneration: Secondary | ICD-10-CM | POA: Diagnosis not present

## 2014-01-01 DIAGNOSIS — J441 Chronic obstructive pulmonary disease with (acute) exacerbation: Secondary | ICD-10-CM | POA: Diagnosis not present

## 2014-01-01 DIAGNOSIS — F329 Major depressive disorder, single episode, unspecified: Secondary | ICD-10-CM | POA: Diagnosis not present

## 2014-01-01 DIAGNOSIS — G894 Chronic pain syndrome: Secondary | ICD-10-CM | POA: Diagnosis not present

## 2014-01-08 DIAGNOSIS — F329 Major depressive disorder, single episode, unspecified: Secondary | ICD-10-CM | POA: Diagnosis not present

## 2014-01-08 DIAGNOSIS — Z433 Encounter for attention to colostomy: Secondary | ICD-10-CM | POA: Diagnosis not present

## 2014-01-08 DIAGNOSIS — G894 Chronic pain syndrome: Secondary | ICD-10-CM | POA: Diagnosis not present

## 2014-01-08 DIAGNOSIS — H3530 Unspecified macular degeneration: Secondary | ICD-10-CM | POA: Diagnosis not present

## 2014-01-08 DIAGNOSIS — J441 Chronic obstructive pulmonary disease with (acute) exacerbation: Secondary | ICD-10-CM | POA: Diagnosis not present

## 2014-01-08 DIAGNOSIS — K9400 Colostomy complication, unspecified: Secondary | ICD-10-CM | POA: Diagnosis not present

## 2014-01-15 ENCOUNTER — Encounter: Payer: Self-pay | Admitting: Internal Medicine

## 2014-01-15 ENCOUNTER — Ambulatory Visit (INDEPENDENT_AMBULATORY_CARE_PROVIDER_SITE_OTHER): Payer: Medicare Other | Admitting: Internal Medicine

## 2014-01-15 VITALS — BP 150/53 | HR 79 | Temp 98.1°F | Ht 64.0 in | Wt 158.2 lb

## 2014-01-15 DIAGNOSIS — Z933 Colostomy status: Secondary | ICD-10-CM

## 2014-01-15 DIAGNOSIS — K458 Other specified abdominal hernia without obstruction or gangrene: Secondary | ICD-10-CM

## 2014-01-15 DIAGNOSIS — G894 Chronic pain syndrome: Secondary | ICD-10-CM | POA: Diagnosis not present

## 2014-01-15 DIAGNOSIS — K5900 Constipation, unspecified: Secondary | ICD-10-CM

## 2014-01-15 DIAGNOSIS — K9400 Colostomy complication, unspecified: Secondary | ICD-10-CM | POA: Diagnosis not present

## 2014-01-15 DIAGNOSIS — F329 Major depressive disorder, single episode, unspecified: Secondary | ICD-10-CM | POA: Diagnosis not present

## 2014-01-15 DIAGNOSIS — M549 Dorsalgia, unspecified: Secondary | ICD-10-CM | POA: Diagnosis not present

## 2014-01-15 DIAGNOSIS — J441 Chronic obstructive pulmonary disease with (acute) exacerbation: Secondary | ICD-10-CM | POA: Diagnosis not present

## 2014-01-15 DIAGNOSIS — Z433 Encounter for attention to colostomy: Secondary | ICD-10-CM | POA: Diagnosis not present

## 2014-01-15 DIAGNOSIS — J329 Chronic sinusitis, unspecified: Secondary | ICD-10-CM | POA: Insufficient documentation

## 2014-01-15 DIAGNOSIS — K5901 Slow transit constipation: Secondary | ICD-10-CM

## 2014-01-15 DIAGNOSIS — H3530 Unspecified macular degeneration: Secondary | ICD-10-CM | POA: Diagnosis not present

## 2014-01-15 MED ORDER — METHOCARBAMOL 500 MG PO TABS: 500.0000 mg | ORAL_TABLET | Freq: Four times a day (QID) | ORAL | Status: DC | PRN

## 2014-01-15 MED ORDER — FLUTICASONE PROPIONATE 50 MCG/ACT NA SUSP: 2.0000 | Freq: Every day | NASAL | Status: DC

## 2014-01-15 MED ORDER — MORPHINE SULFATE ER 15 MG PO TBCR: 30.0000 mg | EXTENDED_RELEASE_TABLET | Freq: Two times a day (BID) | ORAL | Status: DC

## 2014-01-15 MED ORDER — HYDROCODONE-ACETAMINOPHEN 7.5-325 MG PO TABS: 1.0000 | ORAL_TABLET | Freq: Three times a day (TID) | ORAL | Status: DC | PRN

## 2014-01-15 MED ORDER — PROMETHAZINE HCL 12.5 MG PO TABS
12.5000 mg | ORAL_TABLET | Freq: Four times a day (QID) | ORAL | Status: DC | PRN
Start: 2014-01-15 — End: 2014-02-28

## 2014-01-15 NOTE — Patient Instructions (Addendum)
  PATIENT NEEDS:  LIGHTS CHANGED IN MAGNIFYING GLASS TWICE A WEEK AND NEEDS BOOST SUPPLEMENTATION.    Osa Craver, DO 01/15/14  General Instructions:   Thank you for bringing your medicines today. This helps [place] keep you safe from mistakes.

## 2014-01-16 NOTE — Progress Notes (Signed)
Subjective:    Patient ID: [name] Bell, female    DOB: 13-Apr-1945, 68 y.o.   MRN: [id number]  HPI Ms. Pio is a 68 yo female with PMHx of COPD, GERD, Osteoporosis, Depression, HLD, Blindness, and colostomy s/p diverticulitis with perforation who presents for routine follow up. Please see problem based assessment and plan for more information.  Review of Systems General: Denies fever, chills, fatigue Respiratory: Denies SOB, cough, DOE, chest tightness, and wheezing.   Cardiovascular: Denies chest pain and palpitations.  Gastrointestinal: Admits to abdominal hernia. Denies nausea, vomiting, abdominal pain, diarrhea, constipation, blood in stool Genitourinary: Denies dysuria, urgency, frequency Musculoskeletal: Admits to back pain (chronic). Denies myalgias, joint swelling, arthralgias and gait problem.  Skin: Denies pallor, rash and wounds.  Neurological: Denies dizziness, headaches, weakness, lightheadedness Psychiatric/Behavioral: Denies mood changes, confusion, nervousness, sleep disturbance and agitation.  Past Medical History  Diagnosis Date  . Superior mesenteric artery syndrome 11/08    sp dilation by Dr. Watt Climes, Pt may require bowel resetion if sx recur  . Duodenal ulcer 11/08    With hemorrhage and obstruction  . Osteomyelitis, jaw acute 11/08    Started while in the hospital abcess showed GNR . SP debridement by Dr. Lilli Few,  Priscella Mann S Bovis sp 4  weeks of Pen V started on 05/19/07  with additional 2 weeks in 07/04/07  . Degenerative disk disease     This is interscapular, mild compression frx of T12 superior endplate with Schmorl's node. Seen on CT in 2/08  . Depression   . Acute sinusitis 06/30/2011  . Ischemic colitis 07/30/2011    2009 by endoscopy   . Perforated bowel     surgery july 2013  . Blind in both eyes   . Macular degeneration, wet   . Bleeding ulcer   . History of blood transfusion     "related to bleeding from intestines and vomiting blood"  . Chronic  back pain   . Anxiety   . Basal cell carcinoma     "left cheek"   Outpatient Encounter Prescriptions as of 01/15/2014  Medication Sig  . albuterol-ipratropium (COMBIVENT) 18-103 MCG/ACT inhaler Inhale 2 puffs into the lungs every 4 (four) hours as needed for wheezing.  Marland Kitchen buPROPion (WELLBUTRIN) 100 MG tablet Take 1 tablet (100 mg total) by mouth 3 (three) times daily.  Marland Kitchen esomeprazole (NEXIUM) 40 MG capsule Take 1 capsule (40 mg total) by mouth 2 (two) times daily before a meal.  . fluticasone (FLONASE) 50 MCG/ACT nasal spray Place 2 sprays into both nostrils daily.  Marland Kitchen HYDROcodone-acetaminophen (NORCO) 7.5-325 MG per tablet Take 1 tablet by mouth every 8 (eight) hours as needed for moderate pain.  . methocarbamol (ROBAXIN) 500 MG tablet Take 1 tablet (500 mg total) by mouth every 6 (six) hours as needed for muscle spasms.  Marland Kitchen morphine (MS CONTIN) 15 MG 12 hr tablet Take 2 tablets (30 mg total) by mouth every 12 (twelve) hours.  . polyethylene glycol (MIRALAX / GLYCOLAX) packet Take 17 g by mouth daily.  . pravastatin (PRAVACHOL) 80 MG tablet Take 1 tablet (80 mg total) by mouth daily.  . promethazine (PHENERGAN) 12.5 MG tablet Take 1 tablet (12.5 mg total) by mouth every 6 (six) hours as needed for nausea or vomiting.  . senna (SENOKOT) 8.6 MG TABS tablet Take 2 tablets (17.2 mg total) by mouth at bedtime.  . [DISCONTINUED] baclofen (LIORESAL) 5 mg TABS tablet Take 5 mg by mouth 3 (three) times daily as needed for  muscle spasms.  . [DISCONTINUED] HYDROcodone-acetaminophen (NORCO) 7.5-325 MG per tablet Take 1 tablet by mouth every 8 (eight) hours as needed for moderate pain.  . [DISCONTINUED] lidocaine (LIDODERM) 5 % Place 1 patch onto the skin every 12 (twelve) hours. Remove & Discard patch within 12 hours or as directed by MD  . [DISCONTINUED] methocarbamol (ROBAXIN) 500 MG tablet Take 1 tablet (500 mg total) by mouth every 6 (six) hours as needed for muscle spasms.  . [DISCONTINUED] mometasone  (NASONEX) 50 MCG/ACT nasal spray Place 1 spray into the nose daily.  . [DISCONTINUED] morphine (MS CONTIN) 15 MG 12 hr tablet Take 2 tablets (30 mg total) by mouth every 12 (twelve) hours.  . [DISCONTINUED] promethazine (PHENERGAN) 12.5 MG tablet Take 12.5 mg by mouth every 6 (six) hours as needed for nausea or vomiting.      Objective:   Physical Exam Filed Vitals:   01/15/14 1412  BP: 150/53  Pulse: 79  Temp: 98.1 F (36.7 C)  TempSrc: Oral  Height: 5\' 4"  (1.626 m)  Weight: 158 lb 3.2 oz (71.759 kg)  SpO2: 95%   General: Vital signs reviewed.  Patient is well-developed and well-nourished, in no acute distress and cooperative with exam.  Cardiovascular: RRR, S1 normal, S2 normal, no murmurs, gallops, or rubs. Pulmonary/Chest: Clear to auscultation bilaterally, no wheezes, rales, or rhonchi. Abdominal: Abdominal Hernia in left lower quadrant near colostomy site. No protrusion through the colostomy. Site is soft and non-tender and ostomy is pink without exudate.  Extremities: No lower extremity edema bilaterally Neurological: A&O x3 Skin: Warm, dry and intact. No rashes or erythema. Psychiatric: Normal mood and affect. speech and behavior is normal. Cognition and memory are normal.     Assessment & Plan:   Please see problem based assessment and plan.

## 2014-01-16 NOTE — Assessment & Plan Note (Signed)
-  Resolved. Patient has been having regular, brown, soft stools without issue

## 2014-01-16 NOTE — Assessment & Plan Note (Signed)
-  Refilled MS Contin 30 mg BID #120 and Norco 7.5-325 Q8H prn #90 -Patient to follow up with PCP in one month

## 2014-01-16 NOTE — Assessment & Plan Note (Signed)
Colostomy is clean, dry and intact with soft, brown stool and no evidence of infection. Patient has home health who comes out to change colostomy bag. She denies any issues with constipation.

## 2014-01-16 NOTE — Assessment & Plan Note (Signed)
Refilled Flonase

## 2014-01-16 NOTE — Progress Notes (Signed)
Internal Medicine Clinic Attending  I saw and evaluated the patient.  I personally confirmed the key portions of the history and exam documented by Dr. Richardson and I reviewed pertinent patient test results.  The assessment, diagnosis, and plan were formulated together and I agree with the documentation in the resident's note. 

## 2014-01-24 DIAGNOSIS — J441 Chronic obstructive pulmonary disease with (acute) exacerbation: Secondary | ICD-10-CM | POA: Diagnosis not present

## 2014-01-24 DIAGNOSIS — Z433 Encounter for attention to colostomy: Secondary | ICD-10-CM | POA: Diagnosis not present

## 2014-01-24 DIAGNOSIS — K9400 Colostomy complication, unspecified: Secondary | ICD-10-CM | POA: Diagnosis not present

## 2014-01-24 DIAGNOSIS — G894 Chronic pain syndrome: Secondary | ICD-10-CM | POA: Diagnosis not present

## 2014-01-24 DIAGNOSIS — F329 Major depressive disorder, single episode, unspecified: Secondary | ICD-10-CM | POA: Diagnosis not present

## 2014-01-24 DIAGNOSIS — H3530 Unspecified macular degeneration: Secondary | ICD-10-CM | POA: Diagnosis not present

## 2014-01-29 ENCOUNTER — Telehealth: Payer: Self-pay | Admitting: *Deleted

## 2014-01-29 DIAGNOSIS — G894 Chronic pain syndrome: Secondary | ICD-10-CM | POA: Diagnosis not present

## 2014-01-29 DIAGNOSIS — F329 Major depressive disorder, single episode, unspecified: Secondary | ICD-10-CM | POA: Diagnosis not present

## 2014-01-29 DIAGNOSIS — H3530 Unspecified macular degeneration: Secondary | ICD-10-CM | POA: Diagnosis not present

## 2014-01-29 DIAGNOSIS — J441 Chronic obstructive pulmonary disease with (acute) exacerbation: Secondary | ICD-10-CM | POA: Diagnosis not present

## 2014-01-29 DIAGNOSIS — Z433 Encounter for attention to colostomy: Secondary | ICD-10-CM | POA: Diagnosis not present

## 2014-01-29 DIAGNOSIS — K9400 Colostomy complication, unspecified: Secondary | ICD-10-CM | POA: Diagnosis not present

## 2014-01-29 NOTE — Telephone Encounter (Signed)
Call from [employer] with [facility] - # [phone]  Nurse is asking for re certification orders to continue seeing pt for wound care and colostomy care in home. Will this be okay with you?

## 2014-01-31 DIAGNOSIS — Z720 Tobacco use: Secondary | ICD-10-CM | POA: Diagnosis not present

## 2014-01-31 DIAGNOSIS — J441 Chronic obstructive pulmonary disease with (acute) exacerbation: Secondary | ICD-10-CM | POA: Diagnosis not present

## 2014-01-31 DIAGNOSIS — Z602 Problems related to living alone: Secondary | ICD-10-CM | POA: Diagnosis not present

## 2014-01-31 DIAGNOSIS — Z433 Encounter for attention to colostomy: Secondary | ICD-10-CM | POA: Diagnosis not present

## 2014-01-31 DIAGNOSIS — K9400 Colostomy complication, unspecified: Secondary | ICD-10-CM | POA: Diagnosis not present

## 2014-01-31 DIAGNOSIS — G894 Chronic pain syndrome: Secondary | ICD-10-CM | POA: Diagnosis not present

## 2014-01-31 DIAGNOSIS — F329 Major depressive disorder, single episode, unspecified: Secondary | ICD-10-CM | POA: Diagnosis not present

## 2014-01-31 DIAGNOSIS — D519 Vitamin B12 deficiency anemia, unspecified: Secondary | ICD-10-CM | POA: Diagnosis not present

## 2014-01-31 DIAGNOSIS — H3530 Unspecified macular degeneration: Secondary | ICD-10-CM | POA: Diagnosis not present

## 2014-01-31 DIAGNOSIS — L989 Disorder of the skin and subcutaneous tissue, unspecified: Secondary | ICD-10-CM | POA: Diagnosis not present

## 2014-01-31 NOTE — Telephone Encounter (Signed)
Dressing change approved by Dr Arcelia Jew and [facility] informed.

## 2014-01-31 NOTE — Telephone Encounter (Signed)
VO given by Dr Arcelia Jew.

## 2014-01-31 NOTE — Telephone Encounter (Signed)
[facility] informed and she is also wanting to change wound care order.   She would like to try HydroSera Blue foam to wound to help promote healing.  Will this be okay with you?

## 2014-02-05 DIAGNOSIS — Z433 Encounter for attention to colostomy: Secondary | ICD-10-CM | POA: Diagnosis not present

## 2014-02-05 DIAGNOSIS — G894 Chronic pain syndrome: Secondary | ICD-10-CM | POA: Diagnosis not present

## 2014-02-05 DIAGNOSIS — F329 Major depressive disorder, single episode, unspecified: Secondary | ICD-10-CM | POA: Diagnosis not present

## 2014-02-05 DIAGNOSIS — J441 Chronic obstructive pulmonary disease with (acute) exacerbation: Secondary | ICD-10-CM | POA: Diagnosis not present

## 2014-02-05 DIAGNOSIS — K9400 Colostomy complication, unspecified: Secondary | ICD-10-CM | POA: Diagnosis not present

## 2014-02-05 DIAGNOSIS — H3530 Unspecified macular degeneration: Secondary | ICD-10-CM | POA: Diagnosis not present

## 2014-02-07 DIAGNOSIS — J441 Chronic obstructive pulmonary disease with (acute) exacerbation: Secondary | ICD-10-CM | POA: Diagnosis not present

## 2014-02-07 DIAGNOSIS — H3530 Unspecified macular degeneration: Secondary | ICD-10-CM | POA: Diagnosis not present

## 2014-02-07 DIAGNOSIS — Z433 Encounter for attention to colostomy: Secondary | ICD-10-CM | POA: Diagnosis not present

## 2014-02-07 DIAGNOSIS — K9400 Colostomy complication, unspecified: Secondary | ICD-10-CM | POA: Diagnosis not present

## 2014-02-07 DIAGNOSIS — G894 Chronic pain syndrome: Secondary | ICD-10-CM | POA: Diagnosis not present

## 2014-02-07 DIAGNOSIS — F329 Major depressive disorder, single episode, unspecified: Secondary | ICD-10-CM | POA: Diagnosis not present

## 2014-02-12 DIAGNOSIS — H3530 Unspecified macular degeneration: Secondary | ICD-10-CM | POA: Diagnosis not present

## 2014-02-12 DIAGNOSIS — J441 Chronic obstructive pulmonary disease with (acute) exacerbation: Secondary | ICD-10-CM | POA: Diagnosis not present

## 2014-02-12 DIAGNOSIS — K9400 Colostomy complication, unspecified: Secondary | ICD-10-CM | POA: Diagnosis not present

## 2014-02-12 DIAGNOSIS — F329 Major depressive disorder, single episode, unspecified: Secondary | ICD-10-CM | POA: Diagnosis not present

## 2014-02-12 DIAGNOSIS — Z433 Encounter for attention to colostomy: Secondary | ICD-10-CM | POA: Diagnosis not present

## 2014-02-12 DIAGNOSIS — G894 Chronic pain syndrome: Secondary | ICD-10-CM | POA: Diagnosis not present

## 2014-02-19 DIAGNOSIS — F329 Major depressive disorder, single episode, unspecified: Secondary | ICD-10-CM | POA: Diagnosis not present

## 2014-02-19 DIAGNOSIS — J441 Chronic obstructive pulmonary disease with (acute) exacerbation: Secondary | ICD-10-CM | POA: Diagnosis not present

## 2014-02-19 DIAGNOSIS — K9400 Colostomy complication, unspecified: Secondary | ICD-10-CM | POA: Diagnosis not present

## 2014-02-19 DIAGNOSIS — Z433 Encounter for attention to colostomy: Secondary | ICD-10-CM | POA: Diagnosis not present

## 2014-02-19 DIAGNOSIS — H3530 Unspecified macular degeneration: Secondary | ICD-10-CM | POA: Diagnosis not present

## 2014-02-19 DIAGNOSIS — G894 Chronic pain syndrome: Secondary | ICD-10-CM | POA: Diagnosis not present

## 2014-02-22 ENCOUNTER — Other Ambulatory Visit: Payer: Self-pay | Admitting: *Deleted

## 2014-02-22 DIAGNOSIS — G894 Chronic pain syndrome: Secondary | ICD-10-CM

## 2014-02-23 MED ORDER — METHOCARBAMOL 500 MG PO TABS: 500.0000 mg | ORAL_TABLET | Freq: Four times a day (QID) | ORAL | Status: DC | PRN

## 2014-02-23 MED ORDER — MORPHINE SULFATE ER 15 MG PO TBCR: 30.0000 mg | EXTENDED_RELEASE_TABLET | Freq: Two times a day (BID) | ORAL | Status: DC

## 2014-02-23 MED ORDER — HYDROCODONE-ACETAMINOPHEN 7.5-325 MG PO TABS: 1.0000 | ORAL_TABLET | Freq: Three times a day (TID) | ORAL | Status: DC | PRN

## 2014-02-23 NOTE — Telephone Encounter (Signed)
Pt informed Rx is ready 

## 2014-02-26 DIAGNOSIS — H3530 Unspecified macular degeneration: Secondary | ICD-10-CM | POA: Diagnosis not present

## 2014-02-26 DIAGNOSIS — G894 Chronic pain syndrome: Secondary | ICD-10-CM | POA: Diagnosis not present

## 2014-02-26 DIAGNOSIS — J441 Chronic obstructive pulmonary disease with (acute) exacerbation: Secondary | ICD-10-CM | POA: Diagnosis not present

## 2014-02-26 DIAGNOSIS — Z433 Encounter for attention to colostomy: Secondary | ICD-10-CM | POA: Diagnosis not present

## 2014-02-26 DIAGNOSIS — F329 Major depressive disorder, single episode, unspecified: Secondary | ICD-10-CM | POA: Diagnosis not present

## 2014-02-26 DIAGNOSIS — K9400 Colostomy complication, unspecified: Secondary | ICD-10-CM | POA: Diagnosis not present

## 2014-02-28 ENCOUNTER — Other Ambulatory Visit: Payer: Self-pay | Admitting: *Deleted

## 2014-02-28 DIAGNOSIS — G894 Chronic pain syndrome: Secondary | ICD-10-CM

## 2014-02-28 MED ORDER — PROMETHAZINE HCL 12.5 MG PO TABS
12.5000 mg | ORAL_TABLET | Freq: Four times a day (QID) | ORAL | Status: DC | PRN
Start: 2014-02-28 — End: 2014-04-11

## 2014-03-05 DIAGNOSIS — Z433 Encounter for attention to colostomy: Secondary | ICD-10-CM | POA: Diagnosis not present

## 2014-03-05 DIAGNOSIS — K9400 Colostomy complication, unspecified: Secondary | ICD-10-CM | POA: Diagnosis not present

## 2014-03-05 DIAGNOSIS — F329 Major depressive disorder, single episode, unspecified: Secondary | ICD-10-CM | POA: Diagnosis not present

## 2014-03-05 DIAGNOSIS — G894 Chronic pain syndrome: Secondary | ICD-10-CM | POA: Diagnosis not present

## 2014-03-05 DIAGNOSIS — J441 Chronic obstructive pulmonary disease with (acute) exacerbation: Secondary | ICD-10-CM | POA: Diagnosis not present

## 2014-03-05 DIAGNOSIS — H3530 Unspecified macular degeneration: Secondary | ICD-10-CM | POA: Diagnosis not present

## 2014-03-12 DIAGNOSIS — H3530 Unspecified macular degeneration: Secondary | ICD-10-CM | POA: Diagnosis not present

## 2014-03-12 DIAGNOSIS — F329 Major depressive disorder, single episode, unspecified: Secondary | ICD-10-CM | POA: Diagnosis not present

## 2014-03-12 DIAGNOSIS — J441 Chronic obstructive pulmonary disease with (acute) exacerbation: Secondary | ICD-10-CM | POA: Diagnosis not present

## 2014-03-12 DIAGNOSIS — K9400 Colostomy complication, unspecified: Secondary | ICD-10-CM | POA: Diagnosis not present

## 2014-03-12 DIAGNOSIS — G894 Chronic pain syndrome: Secondary | ICD-10-CM | POA: Diagnosis not present

## 2014-03-12 DIAGNOSIS — Z433 Encounter for attention to colostomy: Secondary | ICD-10-CM | POA: Diagnosis not present

## 2014-03-26 ENCOUNTER — Other Ambulatory Visit: Payer: Self-pay | Admitting: *Deleted

## 2014-03-26 DIAGNOSIS — G894 Chronic pain syndrome: Secondary | ICD-10-CM

## 2014-03-26 NOTE — Telephone Encounter (Signed)
Called pharmacy.  Last refill was 2/2 for MS contin and  1/29 for vicodin Pt # [phone]

## 2014-03-27 MED ORDER — HYDROCODONE-ACETAMINOPHEN 7.5-325 MG PO TABS: 1.0000 | ORAL_TABLET | Freq: Three times a day (TID) | ORAL | Status: DC | PRN

## 2014-03-27 MED ORDER — MORPHINE SULFATE ER 15 MG PO TBCR: 30.0000 mg | EXTENDED_RELEASE_TABLET | Freq: Two times a day (BID) | ORAL | Status: DC

## 2014-04-11 ENCOUNTER — Other Ambulatory Visit: Payer: Self-pay | Admitting: *Deleted

## 2014-04-11 DIAGNOSIS — G894 Chronic pain syndrome: Secondary | ICD-10-CM

## 2014-04-11 DIAGNOSIS — J329 Chronic sinusitis, unspecified: Secondary | ICD-10-CM

## 2014-04-12 MED ORDER — PROMETHAZINE HCL 12.5 MG PO TABS: 12.5000 mg | ORAL_TABLET | Freq: Four times a day (QID) | ORAL | Status: DC | PRN

## 2014-04-12 MED ORDER — FLUTICASONE PROPIONATE 50 MCG/ACT NA SUSP: 2.0000 | Freq: Every day | NASAL | Status: DC

## 2014-04-25 ENCOUNTER — Other Ambulatory Visit: Payer: Self-pay | Admitting: *Deleted

## 2014-04-25 DIAGNOSIS — G894 Chronic pain syndrome: Secondary | ICD-10-CM

## 2014-04-25 NOTE — Telephone Encounter (Signed)
Called made to pharmacy, both meds were last filled on 03/28/14.Despina Hidden Cassady3/30/201610:42 AM

## 2014-04-27 MED ORDER — HYDROCODONE-ACETAMINOPHEN 7.5-325 MG PO TABS: 1.0000 | ORAL_TABLET | Freq: Three times a day (TID) | ORAL | Status: DC | PRN

## 2014-04-27 MED ORDER — MORPHINE SULFATE ER 15 MG PO TBCR: 30.0000 mg | EXTENDED_RELEASE_TABLET | Freq: Two times a day (BID) | ORAL | Status: DC

## 2014-04-27 NOTE — Telephone Encounter (Signed)
Pt informed Rx is ready 

## 2014-05-08 ENCOUNTER — Telehealth: Payer: Self-pay | Admitting: *Deleted

## 2014-05-08 NOTE — Telephone Encounter (Signed)
Received faxed PA request from pt's pharmacy for the ms contin 15 mg tabs .  Insurance has a maximum daily dose of 3 tabs per day.  Pt now taking 4 tabs per day.  Pt was contacted and she stated that is currently paying $50/mth to have rx filled and is ok with this price. If she shows her receipts to her landlord, they take money off her monthly rent to offset the cost of meds.  She does wish to make any changes at this time and will continue to pay out of pocket.Regenia Skeeter, Shaquella Stamant Cassady4/12/201610:00 AM

## 2014-05-18 ENCOUNTER — Other Ambulatory Visit: Payer: Self-pay | Admitting: *Deleted

## 2014-05-18 DIAGNOSIS — G894 Chronic pain syndrome: Secondary | ICD-10-CM

## 2014-05-18 NOTE — Telephone Encounter (Signed)
Pharmacy informed pt needs an appt.

## 2014-05-23 ENCOUNTER — Other Ambulatory Visit: Payer: Self-pay | Admitting: *Deleted

## 2014-05-23 DIAGNOSIS — G894 Chronic pain syndrome: Secondary | ICD-10-CM

## 2014-05-23 MED ORDER — MORPHINE SULFATE ER 15 MG PO TBCR: 30.0000 mg | EXTENDED_RELEASE_TABLET | Freq: Two times a day (BID) | ORAL | Status: DC

## 2014-05-23 MED ORDER — PROMETHAZINE HCL 12.5 MG PO TABS
12.5000 mg | ORAL_TABLET | Freq: Four times a day (QID) | ORAL | Status: DC | PRN
Start: 2014-05-23 — End: 2014-06-27

## 2014-05-23 MED ORDER — HYDROCODONE-ACETAMINOPHEN 7.5-325 MG PO TABS: 1.0000 | ORAL_TABLET | Freq: Three times a day (TID) | ORAL | Status: DC | PRN

## 2014-05-23 NOTE — Telephone Encounter (Signed)
Pt aware Rx are ready. 

## 2014-05-29 ENCOUNTER — Other Ambulatory Visit: Payer: Self-pay | Admitting: *Deleted

## 2014-05-29 DIAGNOSIS — F32A Depression, unspecified: Secondary | ICD-10-CM

## 2014-05-29 DIAGNOSIS — F329 Major depressive disorder, single episode, unspecified: Secondary | ICD-10-CM

## 2014-05-30 MED ORDER — BUPROPION HCL 100 MG PO TABS: 100.0000 mg | ORAL_TABLET | Freq: Three times a day (TID) | ORAL | Status: DC

## 2014-06-27 ENCOUNTER — Ambulatory Visit (INDEPENDENT_AMBULATORY_CARE_PROVIDER_SITE_OTHER): Payer: Medicare Other | Admitting: Pulmonary Disease

## 2014-06-27 ENCOUNTER — Encounter: Payer: Self-pay | Admitting: Pulmonary Disease

## 2014-06-27 VITALS — BP 128/53 | HR 98 | Temp 98.4°F | Wt 161.4 lb

## 2014-06-27 DIAGNOSIS — J029 Acute pharyngitis, unspecified: Secondary | ICD-10-CM | POA: Diagnosis not present

## 2014-06-27 DIAGNOSIS — J329 Chronic sinusitis, unspecified: Secondary | ICD-10-CM | POA: Diagnosis not present

## 2014-06-27 DIAGNOSIS — F329 Major depressive disorder, single episode, unspecified: Secondary | ICD-10-CM | POA: Diagnosis not present

## 2014-06-27 DIAGNOSIS — J449 Chronic obstructive pulmonary disease, unspecified: Secondary | ICD-10-CM | POA: Diagnosis present

## 2014-06-27 DIAGNOSIS — G44209 Tension-type headache, unspecified, not intractable: Secondary | ICD-10-CM

## 2014-06-27 DIAGNOSIS — G4452 New daily persistent headache (NDPH): Secondary | ICD-10-CM | POA: Insufficient documentation

## 2014-06-27 DIAGNOSIS — G894 Chronic pain syndrome: Secondary | ICD-10-CM

## 2014-06-27 DIAGNOSIS — R51 Headache: Secondary | ICD-10-CM | POA: Diagnosis not present

## 2014-06-27 DIAGNOSIS — F32A Depression, unspecified: Secondary | ICD-10-CM

## 2014-06-27 DIAGNOSIS — Z7951 Long term (current) use of inhaled steroids: Secondary | ICD-10-CM

## 2014-06-27 DIAGNOSIS — R05 Cough: Secondary | ICD-10-CM

## 2014-06-27 MED ORDER — MORPHINE SULFATE ER 15 MG PO TBCR: 30.0000 mg | EXTENDED_RELEASE_TABLET | Freq: Two times a day (BID) | ORAL | Status: DC

## 2014-06-27 MED ORDER — ALBUTEROL SULFATE HFA 108 (90 BASE) MCG/ACT IN AERS: 2.0000 | INHALATION_SPRAY | Freq: Four times a day (QID) | RESPIRATORY_TRACT | Status: DC | PRN

## 2014-06-27 MED ORDER — NORTRIPTYLINE HCL 25 MG PO CAPS: 25.0000 mg | ORAL_CAPSULE | Freq: Every day | ORAL | Status: DC

## 2014-06-27 MED ORDER — HYDROCODONE-ACETAMINOPHEN 7.5-325 MG PO TABS: 1.0000 | ORAL_TABLET | Freq: Three times a day (TID) | ORAL | Status: DC | PRN

## 2014-06-27 MED ORDER — PROMETHAZINE HCL 12.5 MG PO TABS: 12.5000 mg | ORAL_TABLET | Freq: Four times a day (QID) | ORAL | Status: DC | PRN

## 2014-06-27 NOTE — Assessment & Plan Note (Addendum)
She is due for refill on her Norco 7.5-325 mg every 8 hours as needed #90 and MS Contin 30 mg every 12 hours #120. Review of [place] controlled substance database appropriate. She will need follow-up with her PCP in one month to update contract. She was also started on nortriptyline 25 mg daily at bedtime which may help with her chronic pain.

## 2014-06-27 NOTE — Assessment & Plan Note (Signed)
Started on nortriptyline 25 mg daily at bedtime. Will continue Wellbutrin 100 mg 3 times a day. May consider decreasing Wellbutrin at follow-up appointment.

## 2014-06-27 NOTE — Patient Instructions (Signed)
You will start on Nortriptyline 25mg  at bedtime. Please watch for confusion or increased falls. If you have problems with this medication, please call the clinic.

## 2014-06-27 NOTE — Assessment & Plan Note (Signed)
Assessment: Her headache is likely tension type. She does not have systemic symptoms, neurologic symptoms, sudden onset, or associated conditions such as trauma, illicit drug use, toxic exposure, etc. Onset may be new for her. She does not recall having this before.  Plan: -Do not plan on imaging at this time, though with her age, she may need this. She does not have any other warning signs. -Start nortriptyline 25 mg daily at bedtime. -Continue current pain meds and Robaxin as needed. -Follow up in 1 month. She was instructed that if her headache does not improve or worsens, she will need to return to clinic.

## 2014-06-27 NOTE — Assessment & Plan Note (Signed)
Refilled her albuterol inhaler

## 2014-06-27 NOTE — Progress Notes (Signed)
Subjective:  Patient ID: [name] Bell, female    DOB: [birth year], 69 y.o.   MRN: [id number]  HPI [name] Bell is a 69 year old woman with history of COPD, GERD, hyperlipidemia presenting for evaluation.  Headache: She reports a headache that started about a month ago. She states that it hurts in the back of her head. Bilateral throbbing sensation. It is constant and 5/10 in severity. Denies traumas or fall. Does not know of any exacerbating factors. Denies photophobia or phonophobia. Some relief with pain medications and Robaxin. She reports she has not had this headache before. Denies fever or chills. No confusion, impaired alertness, focal neurologic signs or symptoms. Onset was not sudden. No lightheadedness or presyncope.  She also reports some sore throat. It started Saturday. She has a chronic cough that is productive. No sick contacts.  Review of Systems Constitutional: no fevers/chills Eyes: no vision changes Ears, nose, mouth, throat, and face: + cough Respiratory: no shortness of breath Cardiovascular: no chest pain Gastrointestinal: no nausea/vomiting, no abdominal pain, no constipation, no diarrhea Genitourinary: no dysuria, no hematuria Integument: no rash Hematologic/lymphatic: no bleeding/bruising, no edema Musculoskeletal: no arthralgias, no myalgias Neurological: no paresthesias, no weakness  Past Medical History  Diagnosis Date  . Superior mesenteric artery syndrome 11/08    sp dilation by Dr. Watt Climes, Pt may require bowel resetion if sx recur  . Duodenal ulcer 11/08    With hemorrhage and obstruction  . Osteomyelitis, jaw acute 11/08    Started while in the hospital abcess showed GNR . SP debridement by Dr. Lilli Few,  Priscella Mann S Bovis sp 4  weeks of Pen V started on 05/19/07  with additional 2 weeks in 07/04/07  . Degenerative disk disease     This is interscapular, mild compression frx of T12 superior endplate with Schmorl's node. Seen on CT in 2/08  . Depression    . Acute sinusitis 06/30/2011  . Ischemic colitis 07/30/2011    2009 by endoscopy   . Perforated bowel     surgery july 2013  . Blind in both eyes   . Macular degeneration, wet   . Bleeding ulcer   . History of blood transfusion     "related to bleeding from intestines and vomiting blood"  . Chronic back pain   . Anxiety   . Basal cell carcinoma     "left cheek"    Current Outpatient Prescriptions on File Prior to Visit  Medication Sig Dispense Refill  . albuterol-ipratropium (COMBIVENT) 18-103 MCG/ACT inhaler Inhale 2 puffs into the lungs every 4 (four) hours as needed for wheezing. 1 Inhaler 2  . buPROPion (WELLBUTRIN) 100 MG tablet Take 1 tablet (100 mg total) by mouth 3 (three) times daily. 120 tablet 11  . esomeprazole (NEXIUM) 40 MG capsule Take 1 capsule (40 mg total) by mouth 2 (two) times daily before a meal. 30 capsule 11  . fluticasone (FLONASE) 50 MCG/ACT nasal spray Place 2 sprays into both nostrils daily. 16 g 2  . HYDROcodone-acetaminophen (NORCO) 7.5-325 MG per tablet Take 1 tablet by mouth every 8 (eight) hours as needed for moderate pain. 90 tablet 0  . methocarbamol (ROBAXIN) 500 MG tablet Take 1 tablet (500 mg total) by mouth every 6 (six) hours as needed for muscle spasms. 120 tablet 5  . morphine (MS CONTIN) 15 MG 12 hr tablet Take 2 tablets (30 mg total) by mouth every 12 (twelve) hours. 120 tablet 0  . polyethylene glycol (MIRALAX / GLYCOLAX) packet Take  17 g by mouth daily. 14 each 0  . pravastatin (PRAVACHOL) 80 MG tablet Take 1 tablet (80 mg total) by mouth daily. 30 tablet 11  . promethazine (PHENERGAN) 12.5 MG tablet Take 1 tablet (12.5 mg total) by mouth every 6 (six) hours as needed for nausea or vomiting. 30 tablet 0  . senna (SENOKOT) 8.6 MG TABS tablet Take 2 tablets (17.2 mg total) by mouth at bedtime. 30 each 0   No current facility-administered medications on file prior to visit.    Today's Vitals   06/27/14 1406 06/27/14 1412  BP: 128/53     Pulse: 98   Temp: 98.4 F (36.9 C)   TempSrc: Oral   Weight: 161 lb 6.4 oz (73.211 kg)   SpO2: 97%   PainSc:  5    Objective:  Physical Exam  Constitutional: She is oriented to person, place, and time. No distress.  HENT:  Head: Normocephalic and atraumatic.  Mouth/Throat: Oropharynx is clear and moist. No oropharyngeal exudate.  Eyes: Conjunctivae are normal.  Neck: Neck supple.  Cardiovascular: Normal rate and regular rhythm.   Pulmonary/Chest: Breath sounds normal.  Abdominal: Soft.  Musculoskeletal: Normal range of motion.  Lymphadenopathy:    She has no cervical adenopathy.  Neurological: She is alert and oriented to person, place, and time.  Skin: Skin is warm and dry.  Psychiatric: She has a normal mood and affect.    Assessment & Plan:  Please refer to problem based charting

## 2014-06-27 NOTE — Assessment & Plan Note (Signed)
Assessment: Cough and sore throat likely secondary to chronic sinusitis.  Plan: -Continue symptomatic treatment.

## 2014-06-29 NOTE — Progress Notes (Signed)
Internal Medicine Clinic Attending  Case discussed with Dr. Krall at the time of the visit.  We reviewed the resident's history and exam and pertinent patient test results.  I agree with the assessment, diagnosis, and plan of care documented in the resident's note.  

## 2014-07-31 ENCOUNTER — Encounter: Payer: Self-pay | Admitting: Internal Medicine

## 2014-07-31 ENCOUNTER — Ambulatory Visit (INDEPENDENT_AMBULATORY_CARE_PROVIDER_SITE_OTHER): Payer: Medicare Other | Admitting: Internal Medicine

## 2014-07-31 VITALS — BP 137/47 | HR 103 | Temp 98.0°F | Ht 64.0 in | Wt 155.2 lb

## 2014-07-31 DIAGNOSIS — F17201 Nicotine dependence, unspecified, in remission: Secondary | ICD-10-CM | POA: Diagnosis not present

## 2014-07-31 DIAGNOSIS — K219 Gastro-esophageal reflux disease without esophagitis: Secondary | ICD-10-CM | POA: Diagnosis not present

## 2014-07-31 DIAGNOSIS — J449 Chronic obstructive pulmonary disease, unspecified: Secondary | ICD-10-CM

## 2014-07-31 DIAGNOSIS — F329 Major depressive disorder, single episode, unspecified: Secondary | ICD-10-CM

## 2014-07-31 DIAGNOSIS — F32A Depression, unspecified: Secondary | ICD-10-CM

## 2014-07-31 DIAGNOSIS — E785 Hyperlipidemia, unspecified: Secondary | ICD-10-CM | POA: Diagnosis not present

## 2014-07-31 DIAGNOSIS — J329 Chronic sinusitis, unspecified: Secondary | ICD-10-CM | POA: Diagnosis not present

## 2014-07-31 DIAGNOSIS — Z7951 Long term (current) use of inhaled steroids: Secondary | ICD-10-CM

## 2014-07-31 DIAGNOSIS — F17200 Nicotine dependence, unspecified, uncomplicated: Secondary | ICD-10-CM

## 2014-07-31 DIAGNOSIS — G4452 New daily persistent headache (NDPH): Secondary | ICD-10-CM

## 2014-07-31 DIAGNOSIS — G894 Chronic pain syndrome: Secondary | ICD-10-CM | POA: Diagnosis not present

## 2014-07-31 DIAGNOSIS — R51 Headache: Secondary | ICD-10-CM

## 2014-07-31 DIAGNOSIS — Z933 Colostomy status: Secondary | ICD-10-CM | POA: Diagnosis not present

## 2014-07-31 MED ORDER — MORPHINE SULFATE ER 15 MG PO TBCR
30.0000 mg | EXTENDED_RELEASE_TABLET | Freq: Two times a day (BID) | ORAL | Status: DC
Start: 2014-07-31 — End: 2014-08-31

## 2014-07-31 MED ORDER — NORTRIPTYLINE HCL 50 MG PO CAPS: 50.0000 mg | ORAL_CAPSULE | Freq: Every day | ORAL | Status: DC

## 2014-07-31 MED ORDER — HYDROCODONE-ACETAMINOPHEN 7.5-325 MG PO TABS: 1.0000 | ORAL_TABLET | Freq: Three times a day (TID) | ORAL | Status: DC | PRN

## 2014-07-31 NOTE — Patient Instructions (Signed)
It was a pleasure seeing you today, [name] Bell.   - Increase Nortriptyline to 50 mg daily. I sent a new prescription to your pharmacy. - Continue your pain medications as prescribed.  - Follow up in 1 month to assess your headache. Please call the clinic if your symptoms worsen or your headache is intolerable.   General Instructions:   Please bring your medicines with you each time you come to clinic.  Medicines may include prescription medications, over-the-counter medications, herbal remedies, eye drops, vitamins, or other pills.   Progress Toward Treatment Goals:  Treatment Goal 05/11/2012  Stop smoking smoking the same amount    Self Care Goals & Plans:  Self Care Goal 01/15/2014  Manage my medications take my medicines as prescribed; bring my medications to every visit; refill my medications on time  Eat healthy foods eat more vegetables; eat foods that are low in salt; eat baked foods instead of fried foods  Be physically active find an activity I enjoy  Stop smoking -    No flowsheet data found.   Care Management & Community Referrals:  Referral 05/31/2013  Referrals made for care management support -  Referrals made to community resources none

## 2014-08-01 NOTE — Progress Notes (Signed)
   Subjective:    Patient ID: [name] Bell, female    DOB: 08-17-1945, 69 y.o.   MRN: [id number]  HPI [name] Bell is a 69yo woman with PMHx of COPD, GERD, macular degeneration, HLD, and chronic back pain who presents today for follow up of her headache.   Patient was seen in the clinic on 6/1 and stated the headache had started a month prior to that visit (May 2016). She described the headache as located in the back of her head, throbbing in sensation, constant, and 5/10 in severity. She denied any trauma or falls. She denied any photophobia or phonophobia. She was prescribed nortriptyline 25 mg QHS.   Today, the patient states her headache has improved slightly since starting the nortriptyline. However, her headache remains constant but just less severe with no period of relief. She describes the headache as starting in the back of her head and then sometimes moves to the top of her head. She describes the headache as deep, but is unable to give more of a descriptive quality. She reports associated nausea. She denies any pain in her temporal regions. She has poor vision at baseline due to her macular degeneration but states she has not noticed any changes. She reports she never used to get headaches. Continues to deny photophobia and phonophobia.     Review of Systems General: Denies fever, chills, night sweats, changes in weight, changes in appetite HEENT: Denies ear pain, rhinorrhea, sore throat CV: Denies CP, palpitations, SOB, orthopnea Pulm: Denies SOB, cough, wheezing GI: Denies abdominal pain, vomiting, diarrhea, constipation, melena, hematochezia GU: Denies dysuria, hematuria, frequency Msk: Denies muscle cramps, joint pains Neuro: Denies weakness, numbness, tingling Skin: Denies rashes, bruising    Objective:   Physical Exam General: sitting up in chair, NAD HEENT: [place]/AT. No tenderness to palpation of the temporal regions. Mild tenderness to palpation of occipital region and  back of neck. EOMI, sclera anicteric. Mucus membranes moist.  Neck: supple, ROM not limited CV: RRR, no m/g/r Pulm: CTA bilaterally, breaths non-labored Abd: BS+, soft, non-tender. Left-sided colostomy in place, appears clean and bowel pink.  Ext: warm, no edema Neuro: alert and oriented x 3. Decreased visual fields secondary to macular degeneration.     Assessment & Plan:  Please refer to A&P documentation.

## 2014-08-02 ENCOUNTER — Telehealth: Payer: Self-pay | Admitting: Internal Medicine

## 2014-08-02 DIAGNOSIS — G4452 New daily persistent headache (NDPH): Secondary | ICD-10-CM

## 2014-08-02 NOTE — Assessment & Plan Note (Signed)
Patient takes Pravastatin 80 mg daily. - Needs repeat lipid panel at next visit since last in 2014 - Continue Pravastatin

## 2014-08-02 NOTE — Assessment & Plan Note (Signed)
Patient states she has had some congestion but has not been using her Flonase daily. I recommended her to start using this daily if her symptoms act up. - Continue Flonase

## 2014-08-02 NOTE — Assessment & Plan Note (Addendum)
The patient's headache is worrisome as it is new onset without any previous history of headaches. [name] Bell headache seems most consistent with a new daily persistent headache given the bilateral occipital-nuchal location, continuous headache with no period of relief, throbbing sensation, and associated nausea. I believe a MRI brain with contrast is warranted to rule out harmful secondary causes of [name] Bell headache. Some of the important etiologies to rule out are cerebral venous sinus thrombosis, headache due to spontaneous cerebrospinal fluid leak, brain tumor, AV malformation, chronic meningitis, pseudotumor cerebri (doubt- not childbearing age and not obese), and giant cell arteritis (doubt as no jaw claudication, temporal tenderness, difficult to gauge vision changes).  - MRI brain with contrast - Increase Nortriptyline to 50 mg QHS

## 2014-08-02 NOTE — Telephone Encounter (Signed)
Left message on patient's answering machine yesterday and then no answer twice today. Patient needs MRI brain with contrast scheduled. Have asked front desk to help contacting patient as well.

## 2014-08-02 NOTE — Assessment & Plan Note (Signed)
Patient states symptoms are controlled. She reports the Combivent inhaler was too expensive for her. - Continue albuterol inhaler as needed

## 2014-08-02 NOTE — Assessment & Plan Note (Signed)
Patient reports symptoms are controlled with Nexium. - Continue Nexium

## 2014-08-02 NOTE — Assessment & Plan Note (Signed)
Patient was started on Nortriptyline 25 mg QHS at her last visit to help her headaches but also her chronic pain and depression. She also takes Wellbutrin 100 mg TID. She states her depression symptoms are stable and even slightly improved after starting the Nortriptyline. She denies SI/HI. - Increase Nortriptyline to 50 mg QHS - Continue Wellbutrin 100 mg TID

## 2014-08-02 NOTE — Assessment & Plan Note (Signed)
Colostomy appears clean and pink. Patient denies any recent infections. She reports she was having difficulty with constipation but has found apple juice has relieved this problem. - Continue to monitor

## 2014-08-02 NOTE — Assessment & Plan Note (Signed)
Patient states her chronic back pain is controlled with her current regimen, but her headaches are bothering her. She is taking the Norco every 8 hours as prescribed and generally takes 1-2 Robaxin tablets daily.  - Continue MS Contin 30 mg Q12H - Continue Norco 7.5-325 mg Q8H PRN - Continue Robaxin 500 mg Q6H PRN muscle spasms - Renewed pain contract today - UDS at next visit  - Increase Nortriptyline to 50 mg QHS

## 2014-08-02 NOTE — Assessment & Plan Note (Signed)
Patient reports she quit smoking in May this year. I congratulated her on this accomplishment. We discussed ways to continue smoking cessation.

## 2014-08-06 NOTE — Progress Notes (Signed)
Internal Medicine Clinic Attending  Case discussed with Dr. Rivet at the time of the visit.  We reviewed the resident's history and exam and pertinent patient test results.  I agree with the assessment, diagnosis, and plan of care documented in the resident's note.  

## 2014-08-24 ENCOUNTER — Other Ambulatory Visit: Payer: Self-pay | Admitting: *Deleted

## 2014-08-24 DIAGNOSIS — G894 Chronic pain syndrome: Secondary | ICD-10-CM

## 2014-08-28 ENCOUNTER — Ambulatory Visit (HOSPITAL_COMMUNITY): Admission: RE | Admit: 2014-08-28 | Payer: Medicare Other | Source: Ambulatory Visit

## 2014-08-28 MED ORDER — METHOCARBAMOL 500 MG PO TABS
500.0000 mg | ORAL_TABLET | Freq: Four times a day (QID) | ORAL | Status: DC | PRN
Start: 2014-08-28 — End: 2015-02-05

## 2014-08-28 NOTE — Telephone Encounter (Signed)
Message sent to front desk

## 2014-08-28 NOTE — Telephone Encounter (Signed)
Needs Oct appt Dr Arcelia Jew

## 2014-08-31 ENCOUNTER — Encounter: Payer: Self-pay | Admitting: Internal Medicine

## 2014-08-31 ENCOUNTER — Ambulatory Visit (INDEPENDENT_AMBULATORY_CARE_PROVIDER_SITE_OTHER): Payer: Medicare Other | Admitting: Internal Medicine

## 2014-08-31 VITALS — BP 101/68 | HR 109 | Temp 98.2°F | Ht 64.0 in | Wt 151.1 lb

## 2014-08-31 DIAGNOSIS — G894 Chronic pain syndrome: Secondary | ICD-10-CM

## 2014-08-31 DIAGNOSIS — T07XXXA Unspecified multiple injuries, initial encounter: Secondary | ICD-10-CM

## 2014-08-31 DIAGNOSIS — R21 Rash and other nonspecific skin eruption: Secondary | ICD-10-CM

## 2014-08-31 DIAGNOSIS — Z0000 Encounter for general adult medical examination without abnormal findings: Secondary | ICD-10-CM

## 2014-08-31 MED ORDER — MORPHINE SULFATE ER 15 MG PO TBCR: 30.0000 mg | EXTENDED_RELEASE_TABLET | Freq: Two times a day (BID) | ORAL | Status: DC

## 2014-08-31 MED ORDER — PROMETHAZINE HCL 12.5 MG PO TABS: 12.5000 mg | ORAL_TABLET | Freq: Four times a day (QID) | ORAL | Status: DC | PRN

## 2014-08-31 MED ORDER — HYDROCODONE-ACETAMINOPHEN 7.5-325 MG PO TABS: 1.0000 | ORAL_TABLET | Freq: Three times a day (TID) | ORAL | Status: DC | PRN

## 2014-08-31 NOTE — Assessment & Plan Note (Addendum)
Pt requesting refill of her pain medications and phenergan.  She reports chronic pain due to a hernia that cannot be repaired at this time.  Reports her last dose of pain medication was this morning.  Of note, she is also requesting robaxin but Dr. Lynnae January refilled this on 8/2 so this refill was denied.   -UDS per Dr. Shela Commons last note -refilled norco for 30 days without refills (copy provided to [facility]) -refilled morphine for 30 days without refills (copy provided to [facility]) -has appt with Dr. Arcelia Jew in October but will likely run out of prior to that appt

## 2014-08-31 NOTE — Progress Notes (Signed)
Patient ID: [name] Bell, female   DOB: 1945/07/02, 69 y.o. MRN: [id number]     Subjective:   Patient ID: [name] Bell female    DOB: Jul 30, 1945 69 y.o.    MRN: [id number] Health Maintenance Due: Health Maintenance Due  Topic Date Due  . COLONOSCOPY  09/05/1993  . ZOSTAVAX  09/06/2003  . MAMMOGRAM  06/19/2011  . PNA vac Low Risk Adult (2 of 2 - PCV13) 01/13/2013  . INFLUENZA VACCINE  08/27/2014    _________________________________________________  HPI: [name] Bell is a 69 y.o. female here for an acute visit.  Pt has a PMH outlined below.  Please see problem-based charting assessment and plan note for further details of medical issues addressed at today's visit.  PMH: Past Medical History  Diagnosis Date  . Superior mesenteric artery syndrome 11/08    sp dilation by Dr. Watt Climes, Pt may require bowel resetion if sx recur  . Duodenal ulcer 11/08    With hemorrhage and obstruction  . Osteomyelitis, jaw acute 11/08    Started while in the hospital abcess showed GNR . SP debridement by Dr. Lilli Few,  Priscella Mann S Bovis sp 4  weeks of Pen V started on 05/19/07  with additional 2 weeks in 07/04/07  . Degenerative disk disease     This is interscapular, mild compression frx of T12 superior endplate with Schmorl's node. Seen on CT in 2/08  . Depression   . Acute sinusitis 06/30/2011  . Ischemic colitis 07/30/2011    2009 by endoscopy   . Perforated bowel     surgery july 2013  . Blind in both eyes   . Macular degeneration, wet   . Bleeding ulcer   . History of blood transfusion     "related to bleeding from intestines and vomiting blood"  . Chronic back pain   . Anxiety   . Basal cell carcinoma     "left cheek"    Medications: Current Outpatient Prescriptions on File Prior to Visit  Medication Sig Dispense Refill  . albuterol (PROVENTIL HFA;VENTOLIN HFA) 108 (90 BASE) MCG/ACT inhaler Inhale 2 puffs into the lungs every 6 (six) hours as needed for wheezing or shortness of breath.  1 Inhaler 2  . buPROPion (WELLBUTRIN) 100 MG tablet Take 1 tablet (100 mg total) by mouth 3 (three) times daily. 120 tablet 11  . esomeprazole (NEXIUM) 40 MG capsule Take 1 capsule (40 mg total) by mouth 2 (two) times daily before a meal. 30 capsule 11  . fluticasone (FLONASE) 50 MCG/ACT nasal spray Place 2 sprays into both nostrils daily. 16 g 2  . methocarbamol (ROBAXIN) 500 MG tablet Take 1 tablet (500 mg total) by mouth every 6 (six) hours as needed for muscle spasms. 120 tablet 5  . nortriptyline (PAMELOR) 50 MG capsule Take 1 capsule (50 mg total) by mouth at bedtime. 30 capsule 2  . pravastatin (PRAVACHOL) 80 MG tablet Take 1 tablet (80 mg total) by mouth daily. 30 tablet 11  . senna (SENOKOT) 8.6 MG TABS tablet Take 2 tablets (17.2 mg total) by mouth at bedtime. 30 each 0   No current facility-administered medications on file prior to visit.    Allergies: Allergies  Allergen Reactions  . Ibuprofen     REACTION: Bleeding ulcers  . Latex Itching  . Nsaids     REACTION: Bleeding Ulcer    FH: Family History  Problem Relation Age of Onset  . Cancer Mother   . Heart disease Father   .  Diabetes Sister     SH: History   Social History  . Marital Status: Divorced    Spouse Name: N/A  . Number of Children: N/A  . Years of Education: N/A   Social History Main Topics  . Smoking status: Former Smoker -- 0.12 packs/day for 55 years    Types: Cigarettes    Quit date: 05/30/2014  . Smokeless tobacco: Never Used  . Alcohol Use: No  . Drug Use: No  . Sexual Activity: No   Other Topics Concern  . None   Social History Narrative   Pt now lives by herself in an [facility] where she can come and go as she pleases.  A good friend named Truman Hayward still looks out for her daily.     Review of Systems: Constitutional: Negative for fever, chills and weight loss.  Eyes: Negative for blurred vision.  Respiratory: Negative for cough and shortness of breath.  Cardiovascular:  Negative for chest pain, palpitations and leg swelling.  Gastrointestinal: Negative for nausea, vomiting, abdominal pain, diarrhea, constipation and blood in stool.  Genitourinary: Negative for dysuria, urgency and frequency.  Musculoskeletal: Negative for myalgias and +back pain.  Neurological: Negative for dizziness, weakness and headaches.     Objective:   Vital Signs: Filed Vitals:   08/31/14 1343  BP: 101/68  Pulse: 109  Temp: 98.2 F (36.8 C)  Height: 5\' 4"  (1.626 m)  Weight: 151 lb 1.6 oz (68.539 kg)  SpO2: 96%      BP Readings from Last 3 Encounters:  08/31/14 101/68  07/31/14 137/47  06/27/14 128/53    Physical Exam: Constitutional: Vital signs reviewed.  Patient is in NAD and cooperative with exam.  Head: Normocephalic and atraumatic. Eyes: EOMI, conjunctivae nl, no scleral icterus.  Neck: Supple. Cardiovascular: RRR, no MRG. Pulmonary/Chest: normal effort, CTAB, no wheezes, rales, or rhonchi. Abdominal: Soft. NT/ND +BS. Neurological: A&O x3, cranial nerves II-XII are grossly intact, moving all extremities. Extremities: 2+DP b/l; no pitting edema. Skin: Warm, dry.  RLE below knee on lateral side with excoriations and freckles without obvious papules.    Assessment & Plan:   Assessment and plan was discussed and formulated with my attending.

## 2014-08-31 NOTE — Patient Instructions (Addendum)
Thank you for your visit today.   Please return to the internal medicine clinic in October.    I think you may have gotten into some insects outside.  Please use hydrocortisone on your right leg 3 times daily for about 1 week.  Please let us know if this is not helping.    I have refilled your pain medications today.    Please be sure to bring all of your medications with you to every visit; this includes herbal supplements, vitamins, eye drops, and any over-the-counter medications.   Should you have any questions regarding your medications and/or any new or worsening symptoms, please be sure to call the clinic at [phone].   If you believe that you are suffering from a life threatening condition or one that may result in the loss of limb or function, then you should call 911 or proceed to the nearest Emergency Department.   A healthy lifestyle and preventative care can promote health and wellness.   Maintain regular health, dental, and eye exams.  Eat a healthy diet. Foods like vegetables, fruits, whole grains, low-fat dairy products, and lean protein foods contain the nutrients you need without too many calories. Decrease your intake of foods high in solid fats, added sugars, and salt. Get information about a proper diet from your caregiver, if necessary.  Regular physical exercise is one of the most important things you can do for your health. Most adults should get at least 150 minutes of moderate-intensity exercise (any activity that increases your heart rate and causes you to sweat) each week. In addition, most adults need muscle-strengthening exercises on 2 or more days a week.   Maintain a healthy weight. The body mass index (BMI) is a screening tool to identify possible weight problems. It provides an estimate of body fat based on height and weight. Your caregiver can help determine your BMI, and can help you achieve or maintain a healthy weight. For adults 20 years and  older:  A BMI below 18.5 is considered underweight.  A BMI of 18.5 to 24.9 is normal.  A BMI of 25 to 29.9 is considered overweight.  A BMI of 30 and above is considered obese.

## 2014-08-31 NOTE — Assessment & Plan Note (Signed)
-  needs prevnar, mammogram

## 2014-09-02 DIAGNOSIS — T07XXXA Unspecified multiple injuries, initial encounter: Secondary | ICD-10-CM | POA: Insufficient documentation

## 2014-09-02 NOTE — Assessment & Plan Note (Signed)
Pt p/w RLE below the knee (on the lateral aspect) "leg itching" that began yesterday.  She states she was outside yesterday and the itching began shortly after.  Denies new detergents, parfumes, soaps, etc.  She has tried some benadryl which helped.  Had h/o shingles in the past but not in a dermatomal pattern and no vesicles present.  On exam, I do not appreciate any papules but did have multiple excoriations.  No fever/chills or other systemic s/s.  Likely d/t insect bites given acute nature and lack of any visible rash.   -she states she has some hydrocortisone at home already so I suggested she try that first -will call if she needs a more potent steroid cream

## 2014-09-03 ENCOUNTER — Encounter: Payer: Self-pay | Admitting: *Deleted

## 2014-09-03 NOTE — Progress Notes (Signed)
Internal Medicine Clinic Attending  Case discussed with Dr. Gill soon after the resident saw the patient.  We reviewed the resident's history and exam and pertinent patient test results.  I agree with the assessment, diagnosis, and plan of care documented in the resident's note.  

## 2014-09-07 LAB — PRESCRIPTION ABUSE MONITORING 17P, URINE
6-Acetylmorphine, Urine: NEGATIVE ng/mL
AMPHETAMINE SCREEN URINE: NEGATIVE ng/mL
BARBITURATE SCREEN URINE: NEGATIVE ng/mL
BENZODIAZEPINE SCREEN, URINE: NEGATIVE ng/mL
Buprenorphine, Urine: NEGATIVE ng/mL
CANNABINOIDS UR QL SCN: NEGATIVE ng/mL
Carisoprodol/Meprobamate, Ur: NEGATIVE ng/mL
Cocaine (Metab) Scrn, Ur: NEGATIVE ng/mL
Creatinine(Crt), U: 110.6 mg/dL (ref 20.0–300.0)
EDDP, URINE: NEGATIVE ng/mL
Fentanyl, Urine: NEGATIVE pg/mL
MDMA Screen, Urine: NEGATIVE ng/mL
Meperidine Screen, Urine: NEGATIVE ng/mL
Methadone Screen, Urine: NEGATIVE ng/mL
Nitrite Urine, Quantitative: NEGATIVE ug/mL
OXYCODONE+OXYMORPHONE UR QL SCN: NEGATIVE ng/mL
PHENCYCLIDINE QUANTITATIVE URINE: NEGATIVE ng/mL
Ph of Urine: 5.4 (ref 4.5–8.9)
Propoxyphene Scrn, Ur: NEGATIVE ng/mL
SPECIFIC GRAVITY: 1.015
TRAMADOL SCREEN, URINE: NEGATIVE ng/mL
Tapentadol, Urine: NEGATIVE ng/mL

## 2014-09-07 LAB — OPIATES CONFIRMATION, URINE
Codeine: NEGATIVE
HYDROCODONE CONFIRM: 1840 ng/mL
HYDROMORPHONE: POSITIVE — AB
Hydrocodone: POSITIVE — AB
Hydromorphone Confirm: 992 ng/mL
MORPHINE: POSITIVE — AB
Opiates: POSITIVE ng/mL — AB

## 2014-09-19 ENCOUNTER — Other Ambulatory Visit: Payer: Self-pay | Admitting: *Deleted

## 2014-09-19 DIAGNOSIS — J329 Chronic sinusitis, unspecified: Secondary | ICD-10-CM

## 2014-09-19 MED ORDER — FLUTICASONE PROPIONATE 50 MCG/ACT NA SUSP: 2.0000 | Freq: Every day | NASAL | Status: DC

## 2014-09-26 ENCOUNTER — Other Ambulatory Visit: Payer: Self-pay | Admitting: Internal Medicine

## 2014-09-26 DIAGNOSIS — G894 Chronic pain syndrome: Secondary | ICD-10-CM

## 2014-09-26 NOTE — Telephone Encounter (Signed)
Pt called requesting methocarbamol, morphine, and hydrocodone to be filled.

## 2014-09-26 NOTE — Telephone Encounter (Signed)
Pt has refills on Methocarbamol. Last refill on 8/5 per pharmacy Last OV 8/5

## 2014-09-27 MED ORDER — HYDROCODONE-ACETAMINOPHEN 7.5-325 MG PO TABS: 1.0000 | ORAL_TABLET | Freq: Three times a day (TID) | ORAL | Status: DC | PRN

## 2014-09-27 MED ORDER — MORPHINE SULFATE ER 15 MG PO TBCR: 30.0000 mg | EXTENDED_RELEASE_TABLET | Freq: Two times a day (BID) | ORAL | Status: DC

## 2014-09-27 NOTE — Telephone Encounter (Signed)
Tried phone phone again - same recording.

## 2014-09-27 NOTE — Telephone Encounter (Signed)
Tried to call [phone] - phone number is not in service - changed, disconnected or no longer in service. Tried number four times with same response. Hilda Blades Read Bonelli RN 09/27/14 9AM

## 2014-09-27 NOTE — Telephone Encounter (Signed)
Tried calling home phone number times 4 -  Phone number has been changed, no longer in service or disconnected.

## 2014-09-27 NOTE — Telephone Encounter (Signed)
Pt called to check on Rx - will pick up 09/28/14. Has new phone number - will change number when she comes to clinic 09/28/14.

## 2014-10-30 ENCOUNTER — Encounter: Payer: Self-pay | Admitting: Internal Medicine

## 2014-10-30 ENCOUNTER — Ambulatory Visit (INDEPENDENT_AMBULATORY_CARE_PROVIDER_SITE_OTHER): Payer: Medicare Other | Admitting: Internal Medicine

## 2014-10-30 VITALS — BP 118/86 | HR 113 | Temp 98.5°F | Ht 64.0 in | Wt 147.9 lb

## 2014-10-30 DIAGNOSIS — Z23 Encounter for immunization: Secondary | ICD-10-CM

## 2014-10-30 DIAGNOSIS — F329 Major depressive disorder, single episode, unspecified: Secondary | ICD-10-CM

## 2014-10-30 DIAGNOSIS — G4452 New daily persistent headache (NDPH): Secondary | ICD-10-CM | POA: Diagnosis not present

## 2014-10-30 DIAGNOSIS — Z0000 Encounter for general adult medical examination without abnormal findings: Secondary | ICD-10-CM

## 2014-10-30 DIAGNOSIS — M810 Age-related osteoporosis without current pathological fracture: Secondary | ICD-10-CM | POA: Diagnosis not present

## 2014-10-30 DIAGNOSIS — Z79899 Other long term (current) drug therapy: Secondary | ICD-10-CM

## 2014-10-30 DIAGNOSIS — F32A Depression, unspecified: Secondary | ICD-10-CM

## 2014-10-30 DIAGNOSIS — G894 Chronic pain syndrome: Secondary | ICD-10-CM | POA: Diagnosis not present

## 2014-10-30 MED ORDER — MORPHINE SULFATE ER 15 MG PO TBCR: 30.0000 mg | EXTENDED_RELEASE_TABLET | Freq: Two times a day (BID) | ORAL | Status: DC

## 2014-10-30 MED ORDER — HYDROCODONE-ACETAMINOPHEN 7.5-325 MG PO TABS: 1.0000 | ORAL_TABLET | Freq: Three times a day (TID) | ORAL | Status: DC | PRN

## 2014-10-30 MED ORDER — ALENDRONATE SODIUM 70 MG PO TABS: 70.0000 mg | ORAL_TABLET | ORAL | Status: DC

## 2014-10-30 NOTE — Patient Instructions (Signed)
-   Start taking Alendronate 70 mg once weekly - This medicine can cause an upset stomach in some people - You need to take this medicine 30 minutes before eating and stay upright for at least 30 minutes after taking it  General Instructions:   Please bring your medicines with you each time you come to clinic.  Medicines may include prescription medications, over-the-counter medications, herbal remedies, eye drops, vitamins, or other pills.   Progress Toward Treatment Goals:  Treatment Goal 05/11/2012  Stop smoking smoking the same amount    Self Care Goals & Plans:  Self Care Goal 01/15/2014  Manage my medications take my medicines as prescribed; bring my medications to every visit; refill my medications on time  Eat healthy foods eat more vegetables; eat foods that are low in salt; eat baked foods instead of fried foods  Be physically active find an activity I enjoy  Stop smoking -    No flowsheet data found.   Care Management & Community Referrals:  Referral 05/31/2013  Referrals made for care management support -  Referrals made to community resources none

## 2014-11-03 NOTE — Progress Notes (Signed)
   Subjective:    Patient ID: [name] Bell, female    DOB: [birth year], 69 y.o.   MRN: [id number]  HPI [name] Bell is a 69yo woman with PMHx of COPD, depression, chronic pain syndrome on chronic opioids, and colostomy secondary bowel perforation who presents today for follow up of her chronic back pain and other chronic medical problems.  Chronic Back/Abdominal Pain: Patient reports persistent back in her lower back and abdominal pain from a hernia that cannot be repaired. She denies any worsening of pain. She states her pain is well controlled with Norco and MS Contin. She also takes Robaxin to help with muscle spasms. UDS appropriate on 8/5 and pain contract updated 7/5.   New Daily Persistent Headaches: Patient notes her headaches are still occurring daily. She notes significant pain to touch on the right temporal and top of her head. She states the tenderness is mostly on the right side but will occasionally move to the left side. She notes significant pain when she combs her hair. She notes mild relief with nortriptyline but if she does not take it that her pain is "terrible". She denies any other neurological symptoms including weakness, numbness, or tingling. She was supposed to get an MRI brain after her last visit in July but the scan was never performed.   Osteoporosis: Patient had a T12 vertebral fracture and T score noted to be -3.7in 2009. However, she has not been on bisphosphonate therapy. She reports she is taking calcium and vitamin D supplementation. She denies any recent falls or "near falls" at home.   Depression: Patient reports her depression is well controlled with Wellbutrin and Nortriptyline.    Review of Systems General: Denies fever, chills, night sweats, changes in weight, changes in appetite HEENT: Reports headaches, poor vision- chronic from macular degeneration. Denies ear pain,  rhinorrhea, sore throat CV: Denies CP, palpitations, SOB, orthopnea Pulm: Denies SOB,  cough, wheezing GI: Denies nausea, vomiting, diarrhea, constipation, melena, hematochezia GU: Denies dysuria, hematuria, frequency Msk: Denies muscle cramps, joint pains Neuro: See HPI Skin: Denies rashes, bruising Psych: Reports depression- chronic, symptoms managed. Denies anxiety, hallucinations    Objective:   Physical Exam General: elderly woman sitting up in chair, NAD HEENT: [place]/AT. Mild tenderness to palpation of the right temporal region. No tenderness appreciated on the left side. EOMI, sclera anicteric.  CV: RRR, no m/g/r Pulm: CTA bilaterally, breaths non-labored Abd: BS+, soft, non-tender. Left-sided colostomy in place, appears clean and bowel pink.  Ext: warm, no peripheral edema.  Neuro: alert and oriented x 3     Assessment & Plan:  Please refer to A&P documentation.

## 2014-11-03 NOTE — Assessment & Plan Note (Signed)
Referral for screening mammogram placed. Flu shot today.

## 2014-11-03 NOTE — Assessment & Plan Note (Signed)
Pain controlled on current regimen. Her UDS was appropriate in July and pain contract renewed this year. Patient given 3 prescriptions (3 month supply) for Norco 7.5-325 mg Q8H PRN #90 tablets and MS Contin 30 mg Q12H #120 tablets. Follow up in 3 months.

## 2014-11-03 NOTE — Assessment & Plan Note (Signed)
Patient has never received bisphosphonate therapy, unclear why. She denies being offered this treatment before. Will start Alendronate 70 mg Qweekly. Patient should complete 5 years worth of therapy. Continue calcium and vitamin D supplementation.

## 2014-11-03 NOTE — Assessment & Plan Note (Addendum)
Symptoms well controlled. Continue Nortriptyline and Wellbutrin.

## 2014-11-03 NOTE — Assessment & Plan Note (Addendum)
Her daily persistent headaches are concerning. Differential still includes: cerebral venous sinus thrombosis, headache due to spontaneous cerebrospinal fluid leak, brain tumor, AV malformation, chronic meningitis, pseudotumor cerebri (doubt- not childbearing age and not obese), and giant cell arteritis (doubt as no jaw claudication, difficult to gauge vision changes given chronic macular degeneration). I do not know why her MRI was not scheduled after last visit. Will ensure this imaging will get done in the next 1-2 weeks. Continue Nortriptyline 50 mg QHS to help with symptoms.

## 2014-11-05 ENCOUNTER — Other Ambulatory Visit: Payer: Self-pay | Admitting: *Deleted

## 2014-11-05 NOTE — Progress Notes (Signed)
Internal Medicine Clinic Attending  Case discussed with Dr. Rivet soon after the resident saw the patient.  We reviewed the resident's history and exam and pertinent patient test results.  I agree with the assessment, diagnosis, and plan of care documented in the resident's note.  

## 2014-11-06 ENCOUNTER — Encounter: Payer: Medicare Other | Admitting: Internal Medicine

## 2014-11-06 MED ORDER — NORTRIPTYLINE HCL 50 MG PO CAPS: 50.0000 mg | ORAL_CAPSULE | Freq: Every day | ORAL | Status: DC

## 2014-11-06 NOTE — Telephone Encounter (Signed)
Pls sch routine PCP appt Jan / Feb

## 2014-11-07 ENCOUNTER — Ambulatory Visit: Payer: Medicare Other

## 2014-11-12 ENCOUNTER — Other Ambulatory Visit: Payer: Self-pay | Admitting: Internal Medicine

## 2014-11-12 DIAGNOSIS — G4452 New daily persistent headache (NDPH): Secondary | ICD-10-CM

## 2014-11-21 ENCOUNTER — Ambulatory Visit (HOSPITAL_COMMUNITY)
Admission: RE | Admit: 2014-11-21 | Discharge: 2014-11-21 | Disposition: A | Discharge status: Home / Self Care | Payer: Medicare Other | Source: Ambulatory Visit | Attending: Internal Medicine | Admitting: Internal Medicine

## 2014-11-21 ENCOUNTER — Other Ambulatory Visit (INDEPENDENT_AMBULATORY_CARE_PROVIDER_SITE_OTHER): Payer: Medicare Other

## 2014-11-21 DIAGNOSIS — R51 Headache: Secondary | ICD-10-CM | POA: Diagnosis not present

## 2014-11-21 DIAGNOSIS — G4452 New daily persistent headache (NDPH): Secondary | ICD-10-CM

## 2014-11-21 LAB — CREATININE, SERUM
Creatinine, Ser: 0.67 mg/dL (ref 0.44–1.00)
GFR calc non Af Amer: >60 mL/min (ref >60)

## 2014-11-21 MED ORDER — GADOBENATE DIMEGLUMINE 529 MG/ML IV SOLN
15.0000 mL | Freq: Once | INTRAVENOUS | Status: AC
  Administered 2014-11-21: 13 mL via INTRAVENOUS

## 2014-11-22 LAB — BMP8+ANION GAP
ANION GAP: 24 mmol/L — AB (ref 10.0–18.0)
BUN/Creatinine Ratio: 9 — ABNORMAL LOW (ref 11–26)
BUN: 5 mg/dL — AB (ref 8–27)
CO2: 24 mmol/L (ref 18–29)
Calcium: 9.1 mg/dL (ref 8.7–10.3)
Chloride: 100 mmol/L (ref 97–106)
Creatinine, Ser: 0.58 mg/dL (ref 0.57–1.00)
GFR calc Af Amer: 107 mL/min/{1.73_m2} (ref >59)
GFR calc non Af Amer: 93 mL/min/{1.73_m2} (ref >59)
Glucose: 91 mg/dL (ref 65–99)
Potassium: 3.4 mmol/L — ABNORMAL LOW (ref 3.5–5.2)
SODIUM: 148 mmol/L — AB (ref 136–144)

## 2014-11-29 ENCOUNTER — Other Ambulatory Visit: Payer: Self-pay | Admitting: Internal Medicine

## 2014-12-11 ENCOUNTER — Encounter: Payer: Self-pay | Admitting: Student

## 2014-12-12 ENCOUNTER — Telehealth: Payer: Self-pay | Admitting: *Deleted

## 2014-12-12 NOTE — Telephone Encounter (Signed)
Received PA request from pt's pharmacy for the promethazine.   Per insurance medication is high risk for patients 56yrs and older.  Medication was approved for one year, pharmacy aware.  Insurance does recommend switching pt to zofran unless contraindicated. Zofran was was on medication list in the past (2013). Again, promethazine was approved for one year, but will send to pcp for review to see if medication change is appropriate at this time.Despina Hidden Cassady11/16/201611:37 AM     [facility] [place] Card ID# [id number] [phone]

## 2014-12-14 ENCOUNTER — Telehealth: Payer: Self-pay | Admitting: Internal Medicine

## 2014-12-14 NOTE — Telephone Encounter (Signed)
Ok to continue Phenergan. Patient has told me in past that Zofran does not help her nausea.

## 2014-12-24 ENCOUNTER — Other Ambulatory Visit: Payer: Self-pay

## 2014-12-24 DIAGNOSIS — K219 Gastro-esophageal reflux disease without esophagitis: Secondary | ICD-10-CM

## 2014-12-24 MED ORDER — ESOMEPRAZOLE MAGNESIUM 40 MG PO CPDR: 40.0000 mg | DELAYED_RELEASE_CAPSULE | Freq: Two times a day (BID) | ORAL | Status: DC

## 2015-01-29 ENCOUNTER — Other Ambulatory Visit: Payer: Self-pay | Admitting: Internal Medicine

## 2015-01-29 DIAGNOSIS — G894 Chronic pain syndrome: Secondary | ICD-10-CM

## 2015-01-29 NOTE — Telephone Encounter (Signed)
Patient requesting a refill on her Morphine and Hydrocone

## 2015-02-01 MED ORDER — MORPHINE SULFATE ER 15 MG PO TBCR: 30.0000 mg | EXTENDED_RELEASE_TABLET | Freq: Two times a day (BID) | ORAL | Status: DC

## 2015-02-01 MED ORDER — HYDROCODONE-ACETAMINOPHEN 7.5-325 MG PO TABS: 1.0000 | ORAL_TABLET | Freq: Three times a day (TID) | ORAL | Status: DC | PRN

## 2015-02-01 NOTE — Telephone Encounter (Signed)
Called to advise rx ready- pt aware

## 2015-02-05 ENCOUNTER — Encounter: Payer: Self-pay | Admitting: Internal Medicine

## 2015-02-05 ENCOUNTER — Ambulatory Visit (HOSPITAL_COMMUNITY)
Admission: RE | Admit: 2015-02-05 | Discharge: 2015-02-05 | Disposition: A | Discharge status: Home / Self Care | Payer: Medicare Other | Source: Ambulatory Visit | Attending: Internal Medicine | Admitting: Internal Medicine

## 2015-02-05 ENCOUNTER — Ambulatory Visit (INDEPENDENT_AMBULATORY_CARE_PROVIDER_SITE_OTHER): Payer: Medicare Other | Admitting: Internal Medicine

## 2015-02-05 VITALS — BP 87/41 | HR 118 | Temp 98.0°F | Wt 145.5 lb

## 2015-02-05 DIAGNOSIS — J449 Chronic obstructive pulmonary disease, unspecified: Secondary | ICD-10-CM | POA: Diagnosis not present

## 2015-02-05 DIAGNOSIS — J069 Acute upper respiratory infection, unspecified: Secondary | ICD-10-CM | POA: Insufficient documentation

## 2015-02-05 DIAGNOSIS — J90 Pleural effusion, not elsewhere classified: Secondary | ICD-10-CM | POA: Insufficient documentation

## 2015-02-05 DIAGNOSIS — J029 Acute pharyngitis, unspecified: Secondary | ICD-10-CM | POA: Diagnosis not present

## 2015-02-05 DIAGNOSIS — R062 Wheezing: Secondary | ICD-10-CM

## 2015-02-05 DIAGNOSIS — R05 Cough: Secondary | ICD-10-CM | POA: Diagnosis present

## 2015-02-05 DIAGNOSIS — R0602 Shortness of breath: Secondary | ICD-10-CM | POA: Diagnosis not present

## 2015-02-05 DIAGNOSIS — F32A Depression, unspecified: Secondary | ICD-10-CM

## 2015-02-05 DIAGNOSIS — G8929 Other chronic pain: Secondary | ICD-10-CM

## 2015-02-05 DIAGNOSIS — Z79891 Long term (current) use of opiate analgesic: Secondary | ICD-10-CM

## 2015-02-05 DIAGNOSIS — F329 Major depressive disorder, single episode, unspecified: Secondary | ICD-10-CM

## 2015-02-05 DIAGNOSIS — M810 Age-related osteoporosis without current pathological fracture: Secondary | ICD-10-CM

## 2015-02-05 DIAGNOSIS — G4452 New daily persistent headache (NDPH): Secondary | ICD-10-CM

## 2015-02-05 DIAGNOSIS — J441 Chronic obstructive pulmonary disease with (acute) exacerbation: Secondary | ICD-10-CM

## 2015-02-05 DIAGNOSIS — F1129 Opioid dependence with unspecified opioid-induced disorder: Secondary | ICD-10-CM

## 2015-02-05 DIAGNOSIS — M549 Dorsalgia, unspecified: Secondary | ICD-10-CM

## 2015-02-05 MED ORDER — MORPHINE SULFATE ER 15 MG PO TBCR: 30.0000 mg | EXTENDED_RELEASE_TABLET | Freq: Two times a day (BID) | ORAL | Status: DC

## 2015-02-05 MED ORDER — METHOCARBAMOL 500 MG PO TABS: 500.0000 mg | ORAL_TABLET | Freq: Four times a day (QID) | ORAL | Status: DC | PRN

## 2015-02-05 MED ORDER — HYDROCODONE-ACETAMINOPHEN 7.5-325 MG PO TABS: 1.0000 | ORAL_TABLET | Freq: Three times a day (TID) | ORAL | Status: DC | PRN

## 2015-02-05 MED ORDER — AZITHROMYCIN 250 MG PO TABS: ORAL_TABLET | ORAL | Status: AC

## 2015-02-05 MED ORDER — PREDNISONE 20 MG PO TABS: 20.0000 mg | ORAL_TABLET | Freq: Every day | ORAL | Status: DC

## 2015-02-05 MED ORDER — ALBUTEROL SULFATE (2.5 MG/3ML) 0.083% IN NEBU
2.5000 mg | INHALATION_SOLUTION | Freq: Four times a day (QID) | RESPIRATORY_TRACT | Status: DC | PRN
Start: 2015-02-05 — End: 2015-09-16

## 2015-02-05 NOTE — Patient Instructions (Signed)
-   Take Azithromycin 500 mg on day 1 and then 250 mg daily for next 4 days - Take Prednisone 40 mg daily for next 5 days - Drink plenty of fluids - Can use Tylenol for body aches, fevers  General Instructions:   Please bring your medicines with you each time you come to clinic.  Medicines may include prescription medications, over-the-counter medications, herbal remedies, eye drops, vitamins, or other pills.   Progress Toward Treatment Goals:  Treatment Goal 05/11/2012  Stop smoking smoking the same amount    Self Care Goals & Plans:  Self Care Goal 01/15/2014  Manage my medications take my medicines as prescribed; bring my medications to every visit; refill my medications on time  Eat healthy foods eat more vegetables; eat foods that are low in salt; eat baked foods instead of fried foods  Be physically active find an activity I enjoy  Stop smoking -    No flowsheet data found.   Care Management & Community Referrals:  Referral 05/31/2013  Referrals made for care management support -  Referrals made to community resources none

## 2015-02-06 ENCOUNTER — Encounter: Payer: Self-pay | Admitting: Internal Medicine

## 2015-02-06 DIAGNOSIS — J441 Chronic obstructive pulmonary disease with (acute) exacerbation: Secondary | ICD-10-CM | POA: Insufficient documentation

## 2015-02-06 NOTE — Progress Notes (Signed)
   Subjective:    Patient ID: [name] Bell, female    DOB: 02/22/1945, 70 y.o.   MRN: [id number]  HPI [name] Bell is a 70yo woman with PMHx of COPD, osteoporosis, and chronic back pain who presents today for upper respiratory symptoms.   Likely COPD exacerbation: She reports she started having body aches, cough, rhinorrhea, and feeling weak yesterday morning. She describes feeling congestion in her lungs but "not able to cough it up." She reports associated SOB, sore throat, and wheezing. She states her albuterol inhaler helps her symptoms. She denies any nausea, vomiting, fevers, chills, abdominal pain. She has also tried delsum and mucinex with no relief. She states she has not had an exacerbation of her COPD for years.   New Daily Persistent Headaches: She reports her headaches are less frequent, but is still getting them every 3-4 days. She describes the headaches as bilateral, located in the back of her head, throbbing, sore to touch, and with associated nausea. She had an MRI brain in Oct 2016 that showed no evidence for the cause of her headaches. She notes the nortriptyline is helping reduce the frequency of her headaches and the phenergan helps her nausea.   Osteoporosis: She reports she is taking her Fosamax. Denies any recent falls.   Chronic Back Pain: She reports her back pain is unchanged. She is able to ambulate with a cane. Her pain is well controlled with Norco 7.5-325 mg Q8H. She takes Norco usually 2-3 times daily. She is also on MS Contin 30 mg Q12H and Robaxin as needed for muscle spasms. Her UDS was appropriate in August 2016. Contract up to date.   Depression: Reports her depression is well controlled with Wellbutrin and Nortriptyline. Denies feeling hopeless, loss of interest in things, decreased sleep, changes in eating habits, and SI/HI.    Review of Systems General: Denies night sweats, changes in weight, changes in appetite HEENT: Denies ear pain, changes in  vision CV: Denies CP, palpitations, orthopnea Pulm: See HPI GI: Denies diarrhea, constipation, melena, hematochezia GU: Denies dysuria, hematuria, frequency Msk: Denies muscle cramps Neuro: Denies numbness, tingling Skin: Denies rashes, bruising Psych: Denies hallucinations    Objective:   Physical Exam T: 98   BP 87/41   HR 118  RR 16   O2 94% on RA General: elderly woman sitting up in NAD, non-toxic appearing HEENT: [place]/AT, EOMI, sclera anicteric, pharynx non-erythematous, mucus membranes moist CV: tachycardic in 110s, no m/g/r Pulm: Diffuse rhonchi and expiratory wheezing  Abd: BS+, soft, non-tender, ostomy pink w/ no stool in bag Ext: warm, no peripheral edema Neuro: alert and oriented x 3, no focal deficits     Assessment & Plan:  Please refer to A&P documentation.

## 2015-02-06 NOTE — Assessment & Plan Note (Signed)
Her upper respiratory symptoms and wheezing on exam are most consistent with a COPD exacerbation. Will obtain a CXR to make sure she does not have an underlying pneumonia.  - Obtain CXR>> no infiltrates, acute bronchitic changes with underlying COPD consistent with COPD exacerbation - Prescribed Prednisone 40 mg daily for 5 days and Azithromycin 500 mg on day 1 and 250 mg for 4 more days - Albuterol nebs prescribed for her nebulizer machine - Recommended to follow up in clinic in 1 week if symptoms worsen or do not improve

## 2015-02-06 NOTE — Assessment & Plan Note (Signed)
Pain well controlled. Patient able to achieve her ADLs and ambulate with a cane. Will continue current regimen and follow symptoms. - Given 2 prescriptions for each: Norco 7.5-325 mg Q8H PRN #90 tablets and MS Contin 30 mg Q12H #120 tablets (received this month's prescriptions last week) - Follow up in 3 months

## 2015-02-06 NOTE — Assessment & Plan Note (Signed)
Continue Fosamax. Patient reports compliance.

## 2015-02-06 NOTE — Assessment & Plan Note (Signed)
Continue Nortriptyline and Wellbutrin

## 2015-02-06 NOTE — Assessment & Plan Note (Signed)
Headache frequency has lessened, but she is still experiencing the same symptoms at least twice a week. MRI reassuring for no mass lesion or hydrocephalus. Can consider adding an additional agent to help symptoms such as gabapentin or topiramate to help symptoms. Will continue to monitor.

## 2015-02-07 NOTE — Progress Notes (Signed)
Internal Medicine Clinic Attending  Case discussed with Dr. Rivet soon after the resident saw the patient.  We reviewed the resident's history and exam and pertinent patient test results.  I agree with the assessment, diagnosis, and plan of care documented in the resident's note.  

## 2015-04-01 ENCOUNTER — Telehealth: Payer: Self-pay | Admitting: Internal Medicine

## 2015-04-01 DIAGNOSIS — F1129 Opioid dependence with unspecified opioid-induced disorder: Secondary | ICD-10-CM

## 2015-04-01 MED ORDER — MORPHINE SULFATE ER 15 MG PO TBCR: 30.0000 mg | EXTENDED_RELEASE_TABLET | Freq: Two times a day (BID) | ORAL | Status: DC

## 2015-04-01 MED ORDER — HYDROCODONE-ACETAMINOPHEN 7.5-325 MG PO TABS: 1.0000 | ORAL_TABLET | Freq: Three times a day (TID) | ORAL | Status: DC | PRN

## 2015-04-01 NOTE — Telephone Encounter (Signed)
Pharmacy called and states that DEA and NPI for dr rivet would not "go through" they needed another provider, gave them Dr. Glory Rosebush and then they ask for a new script to be mailed from dr Software engineer for both the pain meds, i have called [facility] to research what is wrong, dr Software engineer could you print new scripts for the pt for this month and hopefully [facility] can get this fixed? thanks

## 2015-04-01 NOTE — Telephone Encounter (Signed)
Pt states Dr. Arcelia Jew DEA number has been cancel. Requesting the nurse to call the pharmacy.

## 2015-04-02 NOTE — Telephone Encounter (Signed)
Have mailed scripts, [name] Bell states it will be some time before this is fixed

## 2015-05-08 ENCOUNTER — Other Ambulatory Visit: Payer: Self-pay | Admitting: Internal Medicine

## 2015-05-08 DIAGNOSIS — F1129 Opioid dependence with unspecified opioid-induced disorder: Secondary | ICD-10-CM

## 2015-05-08 NOTE — Telephone Encounter (Signed)
[name] Bell [phone]

## 2015-05-08 NOTE — Telephone Encounter (Signed)
Pharm states they never rec'd script in [place], gave it to them verbally.

## 2015-05-29 ENCOUNTER — Other Ambulatory Visit: Payer: Self-pay

## 2015-05-29 DIAGNOSIS — F1129 Opioid dependence with unspecified opioid-induced disorder: Secondary | ICD-10-CM

## 2015-05-29 MED ORDER — PROMETHAZINE HCL 12.5 MG PO TABS: 12.5000 mg | ORAL_TABLET | Freq: Four times a day (QID) | ORAL | Status: DC | PRN

## 2015-05-29 NOTE — Telephone Encounter (Signed)
She is supposed to see me every 3 months for her pain management. Please schedule her an appointment for this week or next and I will refill those medications at that visit.

## 2015-05-29 NOTE — Telephone Encounter (Signed)
Spoke with patient and scheduled an appointment for 05/31/2015 at 2:15 PM with Dr. Arcelia Jew.

## 2015-05-29 NOTE — Telephone Encounter (Signed)
Pt requesting hydrocodone, morphine and promethazine to be filled.

## 2015-05-29 NOTE — Telephone Encounter (Signed)
Last office visit 02/05/2015. Last UDS 08/31/2014. Both last filled 04/01/2015. No future appointments.

## 2015-05-31 ENCOUNTER — Other Ambulatory Visit: Payer: Self-pay | Admitting: Internal Medicine

## 2015-05-31 ENCOUNTER — Encounter: Payer: Self-pay | Admitting: Internal Medicine

## 2015-05-31 ENCOUNTER — Ambulatory Visit (INDEPENDENT_AMBULATORY_CARE_PROVIDER_SITE_OTHER): Payer: Medicare Other | Admitting: Internal Medicine

## 2015-05-31 VITALS — BP 132/46 | HR 104 | Temp 98.4°F | Ht 64.0 in | Wt 142.7 lb

## 2015-05-31 DIAGNOSIS — J449 Chronic obstructive pulmonary disease, unspecified: Secondary | ICD-10-CM | POA: Diagnosis not present

## 2015-05-31 DIAGNOSIS — M549 Dorsalgia, unspecified: Secondary | ICD-10-CM

## 2015-05-31 DIAGNOSIS — Z1231 Encounter for screening mammogram for malignant neoplasm of breast: Secondary | ICD-10-CM

## 2015-05-31 DIAGNOSIS — G8929 Other chronic pain: Secondary | ICD-10-CM

## 2015-05-31 DIAGNOSIS — Z23 Encounter for immunization: Secondary | ICD-10-CM

## 2015-05-31 DIAGNOSIS — G4452 New daily persistent headache (NDPH): Secondary | ICD-10-CM

## 2015-05-31 DIAGNOSIS — Z1211 Encounter for screening for malignant neoplasm of colon: Secondary | ICD-10-CM

## 2015-05-31 DIAGNOSIS — Z79891 Long term (current) use of opiate analgesic: Secondary | ICD-10-CM

## 2015-05-31 DIAGNOSIS — F1129 Opioid dependence with unspecified opioid-induced disorder: Secondary | ICD-10-CM

## 2015-05-31 DIAGNOSIS — K469 Unspecified abdominal hernia without obstruction or gangrene: Secondary | ICD-10-CM

## 2015-05-31 MED ORDER — HYDROCODONE-ACETAMINOPHEN 7.5-325 MG PO TABS: 1.0000 | ORAL_TABLET | Freq: Three times a day (TID) | ORAL | Status: DC | PRN

## 2015-05-31 MED ORDER — MORPHINE SULFATE ER 15 MG PO TBCR: 30.0000 mg | EXTENDED_RELEASE_TABLET | Freq: Two times a day (BID) | ORAL | Status: DC

## 2015-05-31 MED ORDER — NORTRIPTYLINE HCL 75 MG PO CAPS: 75.0000 mg | ORAL_CAPSULE | Freq: Every day | ORAL | Status: DC

## 2015-05-31 NOTE — Patient Instructions (Signed)
General Instructions: - Increase Nortriptyline to 75 mg daily. Decrease back down to 50 mg if you are feeling too sleepy. - Try stretching exercises to see if this helps your headache - Mammogram will be scheduled - I placed referral to GI for colonoscopy - If you feel you are having a flare of your COPD please call the clinic so we can evaluate you  Thank you for bringing your medicines today. This helps [place] keep you safe from mistakes.   Progress Toward Treatment Goals:  Treatment Goal 05/11/2012  Stop smoking smoking the same amount    Self Care Goals & Plans:  Self Care Goal 01/15/2014  Manage my medications take my medicines as prescribed; bring my medications to every visit; refill my medications on time  Eat healthy foods eat more vegetables; eat foods that are low in salt; eat baked foods instead of fried foods  Be physically active find an activity I enjoy  Stop smoking -    No flowsheet data found.   Care Management & Community Referrals:  Referral 05/31/2013  Referrals made to community resources none

## 2015-05-31 NOTE — Assessment & Plan Note (Addendum)
Patient reported increased sputum production, wheezing, and SOB but she does not appear to be in acute exacerbation today in clinic. No wheezing heard on exam. I recommended for her to use her albuterol inhaler only if she feels SOB or if she has wheezing. We also discussed that if she feels she is having an acute exacerbation to come into clinic to be evaluated. I do not see any PFTs in the system. I will discuss getting these at her next visit. She may need uptitration of her COPD medications, including a long-acting BD or combined glucocorticoid with long-acting BD. She was given an incentive spirometer to see if this would help open up her lungs.

## 2015-05-31 NOTE — Assessment & Plan Note (Signed)
Her chronic back pain and pain related to her abdominal hernia is well controlled on her current regimen. I gave her 3 prescriptions for Norco 5-325 mg Q8H PRN #90 tablets and MS Contin 30 mg Q12H #120 tablets. Will get UDS next visit.

## 2015-05-31 NOTE — Progress Notes (Signed)
   Subjective:    Patient ID: [name] Bell, female    DOB: [birth year], 70 y.o.   MRN: [id number]  HPI [name] Bell is a 70yo woman with PMHx of COPD, osteoporosis, and chronic back pain who presents today for follow up of her COPD.  COPD: Reports she has been doing better overall since her exacerbation in January. She does note within the past week symptoms of SOB, wheezing, and cough productive of mucus. 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 She notes increased use of her albuterol inhaler over the past few days. She also notes worsening back pain on her left side that she attributes to her COPD. She states the coughing makes the pain worse.   Chronic Back Pain: She reports her back pain and pain related to her inoperable abdominal hernia is well controlled. She is currently taking Norco 7.5-325 mg Q8H PRN and MS Contin 30 mg Q12H.   Daily persistent headache: She reports her headaches are occurring less frequently, about 2 times per week. She states the pain has moved from the occipital region and is more frontotemporal now. She states the pain has been much better since starting Nortriptyline.    Review of Systems General: Denies night sweats, changes in weight, changes in appetite HEENT: Denies ear pain, rhinorrhea, sore throat CV: Denies CP, palpitations, orthopnea Pulm: See HPI GI: Denies abdominal pain, nausea, vomiting, diarrhea, constipation, melena, hematochezia GU: Denies dysuria, hematuria, frequency Msk: Denies muscle cramps, joint pains Neuro: Denies weakness, numbness, tingling Skin: Denies rashes, bruising Psych: Denies anxiety, hallucinations    Objective:   Physical Exam General: alert, sitting up, NAD HEENT: [place]/AT, EOMI, sclera anicteric, mucus membranes moist CV: RRR, no m/g/r Pulm: CTA bilaterally, breaths non-labored, no wheezing, rhonchi or rales Abd: BS+, soft, non-tender. Left colostomy present, stool in  bag.  Ext: warm, no peripheral edema Neuro: alert and oriented x 3    Assessment & Plan:  Please refer to A&P documentation.

## 2015-05-31 NOTE — Assessment & Plan Note (Signed)
-   Mammogram scheduled - Referred to GI for colonoscopy - Given Prevnar-13 vaccine

## 2015-05-31 NOTE — Assessment & Plan Note (Signed)
Will increase her Nortriptyline to 75 mg daily to see if this helps her headaches more. I advised her to decrease back down to 50 mg daily if she feels too sleepy in the morning.

## 2015-06-03 NOTE — Progress Notes (Signed)
Internal Medicine Clinic Attending  Case discussed with Dr. Rivet soon after the resident saw the patient.  We reviewed the resident's history and exam and pertinent patient test results.  I agree with the assessment, diagnosis, and plan of care documented in the resident's note.  

## 2015-06-13 ENCOUNTER — Ambulatory Visit: Payer: Medicare Other

## 2015-06-13 ENCOUNTER — Encounter: Payer: Self-pay | Admitting: Internal Medicine

## 2015-06-26 ENCOUNTER — Other Ambulatory Visit: Payer: Self-pay | Admitting: *Deleted

## 2015-06-26 DIAGNOSIS — K219 Gastro-esophageal reflux disease without esophagitis: Secondary | ICD-10-CM

## 2015-06-26 MED ORDER — ESOMEPRAZOLE MAGNESIUM 40 MG PO CPDR: 40.0000 mg | DELAYED_RELEASE_CAPSULE | Freq: Two times a day (BID) | ORAL | Status: DC

## 2015-06-27 ENCOUNTER — Ambulatory Visit: Payer: Medicare Other

## 2015-07-05 ENCOUNTER — Encounter: Payer: Self-pay | Admitting: Internal Medicine

## 2015-07-25 ENCOUNTER — Other Ambulatory Visit: Payer: Self-pay | Admitting: *Deleted

## 2015-07-25 DIAGNOSIS — F32A Depression, unspecified: Secondary | ICD-10-CM

## 2015-07-25 DIAGNOSIS — F329 Major depressive disorder, single episode, unspecified: Secondary | ICD-10-CM

## 2015-07-25 MED ORDER — BUPROPION HCL 100 MG PO TABS: 100.0000 mg | ORAL_TABLET | Freq: Three times a day (TID) | ORAL | Status: DC

## 2015-07-25 NOTE — Telephone Encounter (Signed)
Current RX expired

## 2015-08-05 ENCOUNTER — Other Ambulatory Visit: Payer: Self-pay | Admitting: *Deleted

## 2015-08-05 DIAGNOSIS — K219 Gastro-esophageal reflux disease without esophagitis: Secondary | ICD-10-CM

## 2015-08-05 MED ORDER — ESOMEPRAZOLE MAGNESIUM 40 MG PO CPDR: 40.0000 mg | DELAYED_RELEASE_CAPSULE | Freq: Two times a day (BID) | ORAL | Status: DC

## 2015-08-05 NOTE — Telephone Encounter (Signed)
Received refill request from pt's pharmacy stating that prescription was filled today, but will need MD authorization for additional refills. (Any refill authorized will be placed on file).  Will send request to pcp for review, please advise.Despina Hidden Cassady7/10/20172:56 PM

## 2015-08-05 NOTE — Addendum Note (Signed)
Addended by: Hulan Fray on: 08/05/2015 07:05 AM   Modules accepted: Orders

## 2015-08-05 NOTE — Addendum Note (Signed)
Addended by: Marcelino Duster on: 08/05/2015 02:56 PM   Modules accepted: Orders

## 2015-08-23 ENCOUNTER — Other Ambulatory Visit: Payer: Self-pay | Admitting: *Deleted

## 2015-08-23 NOTE — Telephone Encounter (Signed)
A user error has taken place: encounter opened in error, closed for administrative reasons.

## 2015-08-28 ENCOUNTER — Other Ambulatory Visit: Payer: Self-pay | Admitting: *Deleted

## 2015-08-28 DIAGNOSIS — J329 Chronic sinusitis, unspecified: Secondary | ICD-10-CM

## 2015-08-28 MED ORDER — FLUTICASONE PROPIONATE 50 MCG/ACT NA SUSP
2.0000 | Freq: Every day | NASAL | 5 refills | Status: DC
Start: 2015-08-28 — End: 2016-01-13

## 2015-08-28 NOTE — Telephone Encounter (Signed)
Last filled 07/31/15

## 2015-08-30 ENCOUNTER — Other Ambulatory Visit: Payer: Self-pay

## 2015-08-30 DIAGNOSIS — F1129 Opioid dependence with unspecified opioid-induced disorder: Secondary | ICD-10-CM

## 2015-08-30 MED ORDER — HYDROCODONE-ACETAMINOPHEN 7.5-325 MG PO TABS: 1.0000 | ORAL_TABLET | Freq: Three times a day (TID) | ORAL | 0 refills | Status: DC | PRN

## 2015-08-30 MED ORDER — MORPHINE SULFATE ER 15 MG PO TBCR: 30.0000 mg | EXTENDED_RELEASE_TABLET | Freq: Two times a day (BID) | ORAL | 0 refills | Status: DC

## 2015-08-30 NOTE — Telephone Encounter (Signed)
Requesting pain meds to be filled.  

## 2015-09-02 ENCOUNTER — Emergency Department (HOSPITAL_COMMUNITY): Payer: Medicare Other

## 2015-09-02 ENCOUNTER — Encounter (HOSPITAL_COMMUNITY): Payer: Self-pay

## 2015-09-02 ENCOUNTER — Observation Stay (HOSPITAL_COMMUNITY): Payer: Medicare Other

## 2015-09-02 ENCOUNTER — Observation Stay (HOSPITAL_COMMUNITY)
Admission: EM | Admit: 2015-09-02 | Discharge: 2015-09-04 | Disposition: A | Discharge status: Home / Self Care | Payer: Medicare Other | Attending: Student in an Organized Health Care Education/Training Program | Admitting: Student in an Organized Health Care Education/Training Program

## 2015-09-02 DIAGNOSIS — R0789 Other chest pain: Secondary | ICD-10-CM | POA: Diagnosis not present

## 2015-09-02 DIAGNOSIS — E785 Hyperlipidemia, unspecified: Secondary | ICD-10-CM | POA: Diagnosis not present

## 2015-09-02 DIAGNOSIS — Q638 Other specified congenital malformations of kidney: Secondary | ICD-10-CM

## 2015-09-02 DIAGNOSIS — Z79899 Other long term (current) drug therapy: Secondary | ICD-10-CM | POA: Insufficient documentation

## 2015-09-02 DIAGNOSIS — K5900 Constipation, unspecified: Secondary | ICD-10-CM | POA: Diagnosis present

## 2015-09-02 DIAGNOSIS — Z9049 Acquired absence of other specified parts of digestive tract: Secondary | ICD-10-CM | POA: Diagnosis not present

## 2015-09-02 DIAGNOSIS — F1129 Opioid dependence with unspecified opioid-induced disorder: Secondary | ICD-10-CM

## 2015-09-02 DIAGNOSIS — F329 Major depressive disorder, single episode, unspecified: Secondary | ICD-10-CM | POA: Diagnosis not present

## 2015-09-02 DIAGNOSIS — K219 Gastro-esophageal reflux disease without esophagitis: Secondary | ICD-10-CM | POA: Diagnosis not present

## 2015-09-02 DIAGNOSIS — K567 Ileus, unspecified: Secondary | ICD-10-CM | POA: Insufficient documentation

## 2015-09-02 DIAGNOSIS — Z87891 Personal history of nicotine dependence: Secondary | ICD-10-CM | POA: Diagnosis not present

## 2015-09-02 DIAGNOSIS — E860 Dehydration: Secondary | ICD-10-CM | POA: Insufficient documentation

## 2015-09-02 DIAGNOSIS — G8929 Other chronic pain: Secondary | ICD-10-CM | POA: Insufficient documentation

## 2015-09-02 DIAGNOSIS — K5909 Other constipation: Secondary | ICD-10-CM | POA: Diagnosis not present

## 2015-09-02 DIAGNOSIS — H540 Blindness, both eyes: Secondary | ICD-10-CM | POA: Insufficient documentation

## 2015-09-02 DIAGNOSIS — Z7983 Long term (current) use of bisphosphonates: Secondary | ICD-10-CM | POA: Diagnosis not present

## 2015-09-02 DIAGNOSIS — R404 Transient alteration of awareness: Secondary | ICD-10-CM | POA: Diagnosis not present

## 2015-09-02 DIAGNOSIS — M549 Dorsalgia, unspecified: Secondary | ICD-10-CM | POA: Diagnosis not present

## 2015-09-02 DIAGNOSIS — Z933 Colostomy status: Secondary | ICD-10-CM | POA: Diagnosis not present

## 2015-09-02 DIAGNOSIS — R531 Weakness: Secondary | ICD-10-CM | POA: Diagnosis not present

## 2015-09-02 DIAGNOSIS — J449 Chronic obstructive pulmonary disease, unspecified: Secondary | ICD-10-CM | POA: Diagnosis not present

## 2015-09-02 DIAGNOSIS — N1330 Unspecified hydronephrosis: Secondary | ICD-10-CM | POA: Diagnosis not present

## 2015-09-02 DIAGNOSIS — Z7951 Long term (current) use of inhaled steroids: Secondary | ICD-10-CM | POA: Insufficient documentation

## 2015-09-02 DIAGNOSIS — IMO0001 Reserved for inherently not codable concepts without codable children: Secondary | ICD-10-CM

## 2015-09-02 DIAGNOSIS — R0602 Shortness of breath: Secondary | ICD-10-CM | POA: Diagnosis not present

## 2015-09-02 LAB — CBC WITH DIFFERENTIAL/PLATELET
Basophils Absolute: 0 10*3/uL (ref 0.0–0.1)
Basophils Relative: 0 %
Eosinophils Absolute: 0 10*3/uL (ref 0.0–0.7)
Eosinophils Relative: 0 %
HCT: 51 % — ABNORMAL HIGH (ref 36.0–46.0)
HEMOGLOBIN: 16.9 g/dL — AB (ref 12.0–15.0)
LYMPHS ABS: 2 10*3/uL (ref 0.7–4.0)
LYMPHS PCT: 22 %
MCH: 29.9 pg (ref 26.0–34.0)
MCHC: 33.1 g/dL (ref 30.0–36.0)
MCV: 90.1 fL (ref 78.0–100.0)
Monocytes Absolute: 0.6 10*3/uL (ref 0.1–1.0)
Monocytes Relative: 7 %
NEUTROS ABS: 6.6 10*3/uL (ref 1.7–7.7)
NEUTROS PCT: 71 %
Platelets: 303 10*3/uL (ref 150–400)
RBC: 5.66 MIL/uL — AB (ref 3.87–5.11)
RDW: 13.4 % (ref 11.5–15.5)
WBC: 9.2 10*3/uL (ref 4.0–10.5)

## 2015-09-02 LAB — URINALYSIS, ROUTINE W REFLEX MICROSCOPIC
Bilirubin Urine: NEGATIVE
GLUCOSE, UA: NEGATIVE mg/dL
HGB URINE DIPSTICK: NEGATIVE
Ketones, ur: NEGATIVE mg/dL
Nitrite: NEGATIVE
Protein, ur: NEGATIVE mg/dL
SPECIFIC GRAVITY, URINE: 1.018 (ref 1.005–1.030)
pH: 5.5 (ref 5.0–8.0)

## 2015-09-02 LAB — COMPREHENSIVE METABOLIC PANEL
ALT: 18 U/L (ref 14–54)
ANION GAP: 11 (ref 5–15)
AST: 38 U/L (ref 15–41)
Albumin: 3.2 g/dL — ABNORMAL LOW (ref 3.5–5.0)
Alkaline Phosphatase: 164 U/L — ABNORMAL HIGH (ref 38–126)
BUN: 8 mg/dL (ref 6–20)
CHLORIDE: 97 mmol/L — AB (ref 101–111)
CO2: 28 mmol/L (ref 22–32)
Calcium: 9.2 mg/dL (ref 8.9–10.3)
Creatinine, Ser: 0.89 mg/dL (ref 0.44–1.00)
GFR calc non Af Amer: >60 mL/min (ref >60)
Glucose, Bld: 116 mg/dL — ABNORMAL HIGH (ref 65–99)
POTASSIUM: 3.5 mmol/L (ref 3.5–5.1)
SODIUM: 136 mmol/L (ref 135–145)
Total Bilirubin: 0.3 mg/dL (ref 0.3–1.2)
Total Protein: 6.7 g/dL (ref 6.5–8.1)

## 2015-09-02 LAB — URINE MICROSCOPIC-ADD ON

## 2015-09-02 LAB — LIPASE, BLOOD: Lipase: 25 U/L (ref 11–51)

## 2015-09-02 MED ORDER — SODIUM CHLORIDE 0.9 % IV SOLN
INTRAVENOUS | Status: DC
  Administered 2015-09-02 – 2015-09-03 (×3): via INTRAVENOUS

## 2015-09-02 MED ORDER — ONDANSETRON HCL 4 MG/2ML IJ SOLN
4.0000 mg | Freq: Once | INTRAMUSCULAR | Status: AC
  Administered 2015-09-02: 4 mg via INTRAVENOUS
  Filled 2015-09-02: qty 2

## 2015-09-02 MED ORDER — FENTANYL CITRATE (PF) 100 MCG/2ML IJ SOLN
50.0000 ug | Freq: Once | INTRAMUSCULAR | Status: AC
  Administered 2015-09-02: 50 ug via INTRAVENOUS
  Filled 2015-09-02: qty 2

## 2015-09-02 MED ORDER — IOPAMIDOL (ISOVUE-300) INJECTION 61%
INTRAVENOUS | Status: AC
  Administered 2015-09-02: 100 mL
  Filled 2015-09-02: qty 100

## 2015-09-02 MED ORDER — SODIUM CHLORIDE 0.9 % IV BOLUS (SEPSIS)
500.0000 mL | Freq: Once | INTRAVENOUS | Status: AC
  Administered 2015-09-02: 500 mL via INTRAVENOUS

## 2015-09-02 MED ORDER — ENOXAPARIN SODIUM 40 MG/0.4ML ~~LOC~~ SOLN
40.0000 mg | SUBCUTANEOUS | Status: DC
  Administered 2015-09-02 – 2015-09-03 (×2): 40 mg via SUBCUTANEOUS
  Filled 2015-09-02 (×3): qty 0.4

## 2015-09-02 MED ORDER — SODIUM CHLORIDE 0.9 % IV BOLUS (SEPSIS)
1000.0000 mL | Freq: Once | INTRAVENOUS | Status: AC
  Administered 2015-09-02: 1000 mL via INTRAVENOUS

## 2015-09-02 MED ORDER — MORPHINE SULFATE (PF) 4 MG/ML IV SOLN
5.0000 mg | INTRAVENOUS | Status: DC
  Administered 2015-09-02 – 2015-09-03 (×5): 5 mg via INTRAVENOUS
  Filled 2015-09-02 (×5): qty 2

## 2015-09-02 NOTE — ED Notes (Signed)
Admitting at bedside 

## 2015-09-02 NOTE — H&P (Signed)
Date: 09/02/2015               Patient Name:  [name] Bell MRN: [id number]  DOB: 12-23-1945 Age / Sex: 70 y.o., female   PCP: [name] Rude, MD         Medical Service: Internal Medicine Teaching Service         Attending Physician: Dr. Axel Filler, MD    First Contact: Dr. Jari Bell Pager: [phone]  Second Contact: Dr. Genene Bell Pager: [phone]       After Hours (After 5p/  First Contact Pager: [phone]  weekends / holidays): Second Contact Pager: [phone]   Chief Complaint: Weakness   History of Present Illness: [name] Bell is a 70 yo female with PMH of blindness, COPD, duodenal ulcer, ischemic colitis, duodenal obstruction s/p dilation, ruptured diverticula s/p colostomy 2013, chronic opioid therapy, and parastomal hernia presenting to the ED via EMS for weakness x 3 days with nausea, vomiting, and constipation. Patient last had a full meal on 08/29/2015, followed by feeling of constipation and weakness, with 4-5 episodes of vomiting since. Patient is on chronic opioid therapy for back pain and parastomal hernia discomfort, and is on chronic dulcolax therapy for the opioid induced constipation, but admits to not taking the dulcolax for the past few days. She has had decreased PO intake of both food and fluids for 2 days. She denies association of food intake to vomiting and nausea. She is unable to perceive the content of her vomitus so is unsure if it contained blood. Patient maintains that she still had stomal output of hard balls of stool throughout, and admits decreased urine output due to decreased intake without further urinary complaints. She does endorse a chronic cough with sputum production but denies shortness of breath, wheezing, fevers, chills, and chest pain.  Labs on admission: Hgb 16.8, HCT 51.0, WBC 9.5 (neg differential), BUN 8, Cr 0.89. EKG positive for sinus tachycardia with right axis shift. CT abd/pelv positive for minimal gaseous distended small bowel and  peristomal hernia encompassing omental fat and transverse colon without signs of edema or obstruction; mild left hydronephrosis and proximal hydroureter.   Meds:  Current Meds  Medication Sig  . albuterol (PROVENTIL HFA;VENTOLIN HFA) 108 (90 BASE) MCG/ACT inhaler Inhale 2 puffs into the lungs every 6 (six) hours as needed for wheezing or shortness of breath.  Marland Kitchen albuterol (PROVENTIL) (2.5 MG/3ML) 0.083% nebulizer solution Take 3 mLs (2.5 mg total) by nebulization every 6 (six) hours as needed for wheezing or shortness of breath.  Marland Kitchen alendronate (FOSAMAX) 70 MG tablet Take 1 tablet (70 mg total) by mouth once a week. Take with a full glass of water on an empty stomach.  . bisacodyl (DULCOLAX) 5 MG EC tablet Take 5 mg by mouth daily as needed for moderate constipation.  Marland Kitchen buPROPion (WELLBUTRIN) 100 MG tablet Take 1 tablet (100 mg total) by mouth 3 (three) times daily. (Patient taking differently: Take 100 mg by mouth 4 (four) times daily. )  . diphenhydrAMINE (BENADRYL) 25 MG tablet Take 25 mg by mouth every 6 (six) hours as needed for allergies.  Marland Kitchen esomeprazole (NEXIUM) 40 MG capsule Take 1 capsule (40 mg total) by mouth 2 (two) times daily before a meal.  . fluticasone (FLONASE) 50 MCG/ACT nasal spray Place 2 sprays into both nostrils daily.  Marland Kitchen HYDROcodone-acetaminophen (NORCO) 7.5-325 MG tablet Take 1 tablet by mouth every 8 (eight) hours as needed for moderate pain. Refill 30 days from last  refill  . methocarbamol (ROBAXIN) 500 MG tablet Take 1 tablet (500 mg total) by mouth every 6 (six) hours as needed for muscle spasms.  Marland Kitchen morphine (MS CONTIN) 15 MG 12 hr tablet Take 2 tablets (30 mg total) by mouth every 12 (twelve) hours. Refill 30 days from last refill  . nortriptyline (PAMELOR) 75 MG capsule Take 1 capsule (75 mg total) by mouth at bedtime.  . pravastatin (PRAVACHOL) 80 MG tablet Take 1 tablet (80 mg total) by mouth daily.  . promethazine (PHENERGAN) 12.5 MG tablet Take 1 tablet (12.5 mg  total) by mouth every 6 (six) hours as needed for nausea or vomiting.     Allergies: Allergies as of 09/02/2015 - Review Complete 09/02/2015  Allergen Reaction Noted  . Ibuprofen Other (See Comments)   . Nsaids Other (See Comments)   . Latex Itching 07/29/2011   Past Medical History:  Diagnosis Date  . Acute sinusitis 06/30/2011  . Anxiety   . Basal cell carcinoma    "left cheek"  . Bleeding ulcer   . Blind in both eyes   . Chronic back pain   . Degenerative disk disease    This is interscapular, mild compression frx of T12 superior endplate with Schmorl's node. Seen on CT in 2/08  . Depression   . Duodenal ulcer 11/08   With hemorrhage and obstruction  . History of blood transfusion    "related to bleeding from intestines and vomiting blood"  . Ischemic colitis ([place]) 07/30/2011   2009 by endoscopy   . Macular degeneration, wet ([place])   . Osteomyelitis, jaw acute 11/08   Started while in the hospital abcess showed GNR . SP debridement by Dr. Lilli Few,  Priscella Mann S Bovis sp 4  weeks of Pen V started on 05/19/07  with additional 2 weeks in 07/04/07  . Perforated bowel [facility])    surgery july 2013  . Superior mesenteric artery syndrome ([place]) 11/08   sp dilation by Dr. Watt Climes, Pt may require bowel resetion if sx recur    Family History: Non contributory  Social History: Past smoker (quit 05/2014) of 0.5ppd x 75yrs; denies alcohol or illicit drug use.   Review of Systems: A complete ROS was negative except as per HPI.   Physical Exam: Blood pressure 113/55, pulse 104, temperature 97.6 F (36.4 C), temperature source Oral, resp. rate 17, height 5\' 4"  (1.626 m), weight 62.6 kg (138 lb), last menstrual period 08/25/1980, SpO2 95 %. Physical Exam  Constitutional: She is oriented to person, place, and time.  NAD  HENT:  Head: Normocephalic and atraumatic.  Dry oropharyngeal mucosa  Pulmonary/Chest: Effort normal and breath sounds normal. No respiratory distress. She has no wheezes. She  has no rales.  Abdominal: Soft. Bowel sounds are normal. She exhibits no distension. There is no guarding.  Mild tenderness throughout, more tender in parastomal region; Hard stool present in ostomy bag; stoma is clean, pink, without discharge  Musculoskeletal: She exhibits no edema.  Neurological: She is alert and oriented to person, place, and time.  Skin:  Tenting of skin present    EKG: Sinus tachycardia at 120bpm, right axis deviation  CXR: No acute cardiopulmonary process.  Assessment & Plan by Problem: Active Problems:   Ileus ([place])   Ileus secondary to chronic opioid use: Patient presents with nausea, vomiting, abdominal pain, and weakness x 3 days with no laxative use in the last few days (though chronic use otherwise), and decreased PO intake. Patient is on a 52 morphine  dose equivalents of opioid therapy at home for chronic pain, and on multiple anticholinergic agents. On exam patient appears dehydrated with dry mucous membranes and tenting of skin; CBC also indicated hemoconcentration with Hgb at 16.8 and HCT 51.0, without leukocytosis or fever. Her parastomal hernia would be a site of obstruction, but without leukocytosis, blood in stool, and no evidence of edema or obstruction on CT, this is less likely. Presentation is probably combination of constipation and dehydration due to poor oral intake and abstinence of laxative compounded by high opioid therapy.  --IV morphine 5mg  q4hr scheduled --NS 126ml/hr; received 1.5L NS in ED --NPO, except ice chips --Zofran PRN  L hydroureter and hydronephrosis: Patient had finding of mild left hydroureter and hydronephrosis on CT abd/pelvis not shown on previous scans, with decreased urine output, and no complaints of dysurua, pyuria, or hematuria. She is afebrile and without leukocytosis. UA was positive for rare bacteria and leukocytes.  --NS 170ml/hr --Urine Cx --Renal U/S of left kidney  Chronic back pain and parastomal hernia pain:  Patient is on 52.5 morphine dose equivalents of opioid therapy for chronic pain. She states that she has not taken her pain medication in the last couple of days since she's been feeling weak. Her abdominal pain is not out of the ordinary to her usual chronic pain. She is also on several anticholinergic agents. Ideally in the setting of possible ileus or constipation, we would like to limit opioids and anticholinergic meds as much as possible; however due to her high dose of opioids, we will continue morphine at a much reduced daily dose.  --Morphine 5mg  IV q4hr scheduled (1/2 her daily dose)  --limit opioids and anticholinergic meds   Dispo: Admit patient to Observation with expected length of stay less than 2 midnights.  Signed: Alphonzo Grieve, MD 09/02/2015, 5:28 PM  Pager: [phone]

## 2015-09-02 NOTE — ED Triage Notes (Signed)
Pt. Coming from home via [facility] for SOB and increasing weakness since Friday. Pt. Reports she has not been able to get out of bed since she's been weak. Pt. Friend on scene states patient has been weak for a few months. Pt. Denies any medical hx, but does have a colostomy bag to left abdomen and sts she doesn't know why she has it. EMS notes rales to lower lobes bilaterally. Pt. Aox4.

## 2015-09-02 NOTE — Progress Notes (Signed)
Pt admitted to the unit at [facility]. Pt mental status is A&Ox4. Pt oriented to room, staff, and call bell. Skin is intact. Full assessment charted in [facility]. Call bell within reach. Visitor guidelines reviewed w/ pt and/or family.
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
## 2015-09-03 ENCOUNTER — Encounter (HOSPITAL_COMMUNITY): Payer: Self-pay | Admitting: General Practice

## 2015-09-03 DIAGNOSIS — Z933 Colostomy status: Secondary | ICD-10-CM

## 2015-09-03 DIAGNOSIS — M549 Dorsalgia, unspecified: Secondary | ICD-10-CM

## 2015-09-03 DIAGNOSIS — K5903 Drug induced constipation: Secondary | ICD-10-CM

## 2015-09-03 DIAGNOSIS — G8929 Other chronic pain: Secondary | ICD-10-CM

## 2015-09-03 DIAGNOSIS — K435 Parastomal hernia without obstruction or  gangrene: Secondary | ICD-10-CM

## 2015-09-03 DIAGNOSIS — T402X5A Adverse effect of other opioids, initial encounter: Secondary | ICD-10-CM | POA: Diagnosis not present

## 2015-09-03 DIAGNOSIS — N1330 Unspecified hydronephrosis: Secondary | ICD-10-CM

## 2015-09-03 DIAGNOSIS — R109 Unspecified abdominal pain: Secondary | ICD-10-CM

## 2015-09-03 LAB — BASIC METABOLIC PANEL
ANION GAP: 10 (ref 5–15)
BUN: 6 mg/dL (ref 6–20)
CALCIUM: 7.9 mg/dL — AB (ref 8.9–10.3)
CO2: 24 mmol/L (ref 22–32)
Chloride: 106 mmol/L (ref 101–111)
Creatinine, Ser: 0.68 mg/dL (ref 0.44–1.00)
Glucose, Bld: 82 mg/dL (ref 65–99)
Potassium: 3.6 mmol/L (ref 3.5–5.1)
Sodium: 140 mmol/L (ref 135–145)

## 2015-09-03 LAB — CBC
HEMATOCRIT: 45.3 % (ref 36.0–46.0)
Hemoglobin: 14.6 g/dL (ref 12.0–15.0)
MCH: 29.3 pg (ref 26.0–34.0)
MCHC: 32.2 g/dL (ref 30.0–36.0)
MCV: 90.8 fL (ref 78.0–100.0)
PLATELETS: 249 10*3/uL (ref 150–400)
RBC: 4.99 MIL/uL (ref 3.87–5.11)
RDW: 13.9 % (ref 11.5–15.5)
WBC: 7 10*3/uL (ref 4.0–10.5)

## 2015-09-03 LAB — MRSA PCR SCREENING: MRSA by PCR: POSITIVE — AB

## 2015-09-03 MED ORDER — OXYCODONE HCL 5 MG PO TABS
5.0000 mg | ORAL_TABLET | Freq: Four times a day (QID) | ORAL | Status: DC
  Administered 2015-09-03 – 2015-09-04 (×3): 5 mg via ORAL
  Filled 2015-09-03 (×3): qty 1

## 2015-09-03 MED ORDER — SENNA 8.6 MG PO TABS
1.0000 | ORAL_TABLET | Freq: Every day | ORAL | Status: DC
  Administered 2015-09-03: 8.6 mg via ORAL
  Filled 2015-09-03: qty 1

## 2015-09-03 MED ORDER — OXYCODONE HCL 5 MG PO TABS
10.0000 mg | ORAL_TABLET | Freq: Four times a day (QID) | ORAL | Status: DC
  Administered 2015-09-03: 10 mg via ORAL
  Filled 2015-09-03: qty 2

## 2015-09-03 MED ORDER — POLYETHYLENE GLYCOL 3350 17 G PO PACK
17.0000 g | PACK | Freq: Every day | ORAL | Status: DC
  Administered 2015-09-03 – 2015-09-04 (×2): 17 g via ORAL
  Filled 2015-09-03 (×2): qty 1

## 2015-09-03 MED ORDER — MORPHINE SULFATE ER 15 MG PO TBCR
15.0000 mg | EXTENDED_RELEASE_TABLET | Freq: Two times a day (BID) | ORAL | Status: DC
  Administered 2015-09-03 – 2015-09-04 (×2): 15 mg via ORAL
  Filled 2015-09-03 (×2): qty 1

## 2015-09-03 MED ORDER — MUPIROCIN 2 % EX OINT
1.0000 "application " | TOPICAL_OINTMENT | Freq: Two times a day (BID) | CUTANEOUS | Status: DC
  Administered 2015-09-03 – 2015-09-04 (×3): 1 via NASAL
  Filled 2015-09-03 (×2): qty 22

## 2015-09-03 MED ORDER — BOOST / RESOURCE BREEZE PO LIQD
1.0000 | Freq: Three times a day (TID) | ORAL | Status: DC
  Administered 2015-09-03 – 2015-09-04 (×2): 1 via ORAL

## 2015-09-03 MED ORDER — CHLORHEXIDINE GLUCONATE CLOTH 2 % EX PADS
6.0000 | MEDICATED_PAD | Freq: Every day | CUTANEOUS | Status: DC
  Administered 2015-09-03 – 2015-09-04 (×2): 6 via TOPICAL

## 2015-09-03 NOTE — Progress Notes (Signed)
Subjective: Patient states she feels less fatigued today, is comfortable with the pain regimen. She endorses passing gas into stoma. Is willing to try liquid diet today to see how she tolerates.  Patient stated that yesterday she had some chest pressure that has resolved now; EKG on admission was positive for sinus tachycardia at 120, no ST changes.  Objective:  Vital signs in last 24 hours: Vitals:   09/02/15 2114 09/02/15 2115 09/02/15 2116 09/03/15 0552  BP: (!) 118/43 (!) 113/46 (!) 101/34 (!) 127/49  Pulse: 94 96 (!) 101 93  Resp: 18   18  Temp: 97.9 F (36.6 C)   97.8 F (36.6 C)  TempSrc: Oral   Oral  SpO2: 100%   97%  Weight:      Height:       Constitutional: Well developed, NAD, lying in bed comfortably CV: RRR, no rubs, murmurs, or gallops Resp: CTAB, no increased work of breathing, no wheezing or rales Abd: soft, nondistended, +BS, stool and gas present in ostomy bag, mildly tender in parastomal area Ext: no edema, 2+ pulses throughout Skin: warm, normal skin turgor  Assessment/Plan:  Principal Problem:   Constipation Active Problems:   Hyperlipidemia   Colostomy in place   COPD (chronic obstructive pulmonary disease) (HCC)  Constipation: Patient presents with nausea, vomiting, abdominal pain, and weakness x 3 days with no laxative use in the last few days (though chronic use otherwise), and decreased PO intake; she had hard stool in her ostomy bag. Patient is on a 52 morphine dose equivalents of opioid therapy at home for chronic pain, and on multiple anticholinergic agents. On admission patient was dehydrated and received 1.5L NS + 15ml/hr NS continuously. CT abd/pelv positive for minimal gaseous distended small bowel and peristomal hernia encompassing omental fat and transverse colon without signs of edema or obstruction; mild left hydronephrosis and proximal hydroureter.  --Liquid diet today, will see how she tolerats --PRN Zofran --122ml/hr NS cont, will  d/c if she is tolerating liquid diet  Chest pressure: Patient stated that while feeling extremely weak yesterday, she also had some chest pressure present, but no specific chest pain or shortness of breath. EKG on admission was positive for sinus tachycardia at 120, no ST changes.  --Add on troponin to morning labs - f/u --If chest discomfort recurs, repeat EKG and troponins  L hydroureter and hydronephrosis: Patient had finding of mild left hydroureter and hydronephrosis on CT abd/pelvis not shown on previous scans, with decreased urine output, and no complaints of dysurua, pyuria, or hematuria. She is afebrile and without leukocytosis. UA was positive for rare bacteria and leukocytes.  --NS 113ml/hr --Urine Cx --Renal U/S of left kidney 09/02/2015: extrarenal pelvis, no obstruction noted.  Chronic back pain and parastomal hernia pain: Patient is on 52.5 morphine dose equivalents of opioid therapy for chronic pain. She states that she has not taken her pain medication in the last couple of days since she's been feeling weak. Her abdominal pain is not out of the ordinary to her usual chronic pain. She is also on several anticholinergic agents. Ideally in the setting of possible ileus or constipation, we would like to limit opioids and anticholinergic meds as much as possible; however due to her high dose of opioids, we will continue morphine at a much reduced daily dose.  --Morphine 5mg  IV q4hr scheduled (1/2 her daily dose)  --limit opioids and anticholinergic meds  Dispo: Anticipated discharge in approximately 1-2 day(s).   Alphonzo Grieve, MD 09/03/2015,  11:39 AM Pager: [phone]

## 2015-09-03 NOTE — Care Management Note (Signed)
Case Management Note  Patient Details  Name: [name] Bell MRN: [id number] Date of Birth: 1945-03-04  Subjective/Objective:        Patient presents with nausea, vomiting, abdominal pain, and weakness x 3 days with no laxative use in the last few days (though chronic use otherwise), and decreased PO intake; she had hard stool in her ostomy bag. Hx of chronic pain back / parastomal hernia pain with high dose usage of opioids.   Action/Plan: Return to home when medically stable .... CM to f/u with d/c disposition.  Expected Discharge Date:                  Expected Discharge Plan:  Home/Self Care (Independent Living facility)  In-House Referral:     Discharge planning Services  CM Consult  Post Acute Care Choice:    Choice offered to:     DME Arranged:    DME Agency:     HH Arranged:    HH Agency:     Status of Service:  In process, will continue to follow  If discussed at Long Length of Stay Meetings, dates discussed:    Additional Comments:  Sharin Mons, RN 09/03/2015, 3:30 PM

## 2015-09-04 DIAGNOSIS — K5903 Drug induced constipation: Secondary | ICD-10-CM | POA: Diagnosis not present

## 2015-09-04 DIAGNOSIS — K5900 Constipation, unspecified: Secondary | ICD-10-CM | POA: Diagnosis not present

## 2015-09-04 DIAGNOSIS — N1330 Unspecified hydronephrosis: Secondary | ICD-10-CM | POA: Diagnosis not present

## 2015-09-04 DIAGNOSIS — T402X5A Adverse effect of other opioids, initial encounter: Secondary | ICD-10-CM | POA: Diagnosis not present

## 2015-09-04 DIAGNOSIS — R1084 Generalized abdominal pain: Secondary | ICD-10-CM | POA: Diagnosis not present

## 2015-09-04 DIAGNOSIS — E860 Dehydration: Secondary | ICD-10-CM | POA: Diagnosis not present

## 2015-09-04 DIAGNOSIS — Z933 Colostomy status: Secondary | ICD-10-CM | POA: Diagnosis not present

## 2015-09-04 LAB — URINE CULTURE

## 2015-09-04 MED ORDER — DOCUSATE SODIUM 100 MG PO CAPS
200.0000 mg | ORAL_CAPSULE | Freq: Every day | ORAL | Status: DC
  Administered 2015-09-04: 200 mg via ORAL
  Filled 2015-09-04: qty 2

## 2015-09-04 MED ORDER — SENNA 8.6 MG PO TABS: 2.0000 | ORAL_TABLET | Freq: Every evening | ORAL | 0 refills | Status: DC | PRN

## 2015-09-04 MED ORDER — HYDROCODONE-ACETAMINOPHEN 7.5-325 MG PO TABS
1.0000 | ORAL_TABLET | Freq: Three times a day (TID) | ORAL | Status: DC | PRN
  Administered 2015-09-04: 1 via ORAL
  Filled 2015-09-04: qty 1

## 2015-09-04 MED ORDER — OXYCODONE HCL 5 MG PO TABS
5.0000 mg | ORAL_TABLET | Freq: Once | ORAL | Status: AC
  Administered 2015-09-04: 5 mg via ORAL
  Filled 2015-09-04: qty 1

## 2015-09-04 MED ORDER — OXYCODONE HCL 5 MG PO TABS: 5.0000 mg | ORAL_TABLET | ORAL | Status: DC | PRN

## 2015-09-04 MED ORDER — MORPHINE SULFATE ER 30 MG PO TBCR: 30.0000 mg | EXTENDED_RELEASE_TABLET | Freq: Two times a day (BID) | ORAL | Status: DC

## 2015-09-04 MED ORDER — DOCUSATE SODIUM 100 MG PO CAPS: 200.0000 mg | ORAL_CAPSULE | Freq: Every day | ORAL | 0 refills | Status: DC

## 2015-09-04 MED ORDER — SENNA 8.6 MG PO TABS
2.0000 | ORAL_TABLET | Freq: Two times a day (BID) | ORAL | Status: DC
  Administered 2015-09-04: 17.2 mg via ORAL
  Filled 2015-09-04: qty 2

## 2015-09-04 MED ORDER — HYDROCODONE-ACETAMINOPHEN 7.5-325 MG PO TABS: 1.0000 | ORAL_TABLET | Freq: Three times a day (TID) | ORAL | 0 refills | Status: DC | PRN

## 2015-09-04 MED ORDER — MORPHINE SULFATE ER 15 MG PO TBCR: 30.0000 mg | EXTENDED_RELEASE_TABLET | Freq: Two times a day (BID) | ORAL | 0 refills | Status: DC

## 2015-09-04 MED ORDER — POLYETHYLENE GLYCOL 3350 17 G PO PACK: 17.0000 g | PACK | Freq: Two times a day (BID) | ORAL | 0 refills | Status: DC

## 2015-09-04 NOTE — Progress Notes (Signed)
[facility] ambulance arranged on behalf of pt.

## 2015-09-04 NOTE — Progress Notes (Signed)
Subjective: Patient is feeling overall better today, though is stating her pain is not quite being managed as adequately as it was yesterday. She has been tolerating her liquid diet and is ready to advance. Still no resolution of constipation upon starting mild laxatives yesterday. Patient is agreeable to discharge to [place] later today.  Objective:  Vital signs in last 24 hours: Vitals:   09/02/15 2116 09/03/15 0552 09/03/15 2143 09/04/15 0507  BP: (!) 101/34 (!) 127/49 (!) 114/44 129/61  Pulse: (!) 101 93 96 92  Resp:  18 18 18   Temp:  97.8 F (36.6 C) 98.3 F (36.8 C) 97.8 F (36.6 C)  TempSrc:  Oral Oral Oral  SpO2:  97% 98% 100%  Weight:      Height:       Constitutional: Well developed, NAD, lying in bed comfortably CV: RRR, no rubs, murmurs, or gallops Resp: CTAB, no increased work of breathing, no wheezing or rales Abd: soft, nondistended, +BS, stool and gas present in ostomy bag, mildly tender in parastomal area Ext: no edema, 2+ pulses throughout Skin: warm, normal skin turgor  Assessment/Plan:  Principal Problem:   Constipation Active Problems:   Hyperlipidemia   Colostomy in place   COPD (chronic obstructive pulmonary disease) (HCC)  Constipation: Patient presents with nausea, vomiting, abdominal pain, and weakness x 3 days with no laxative use in the last few days (though chronic use otherwise), and decreased PO intake; she had hard stool in her ostomy bag. Patient is on a 52 morphine dose equivalents of opioid therapy at home for chronic pain, and on multiple anticholinergic agents. On admission patient was dehydrated and received 1.5L NS + 141ml/hr NS continuously. CT abd/pelv positive for minimal gaseous distended small bowel and peristomal hernia encompassing omental fat and transverse colon without signs of edema or obstruction; mild left hydronephrosis and proximal hydroureter. She has been tolerating thin and full liquid diet without  symptoms of nausea and no vomiting. Attempted lower dosing of laxatives yesterday without effect. --Miralax, Colace 200mg  daily, Senna 2 tabs bid --Advancing diet as tolerated --PRN Zofran --encouraged her to continue use of her laxatives on d/c and maintain good hydration.  Chest pressure: Patient stated that while feeling extremely weak yesterday, she also had some chest pressure present, but no specific chest pain or shortness of breath. EKG on admission was positive for sinus tachycardia at 120, no ST changes. Since admission, has had no further complaints of chest pain or pressure. --Monitor  L hydroureter and hydronephrosis: Patient had finding of mild left hydroureter and hydronephrosis on CT abd/pelvis not shown on previous scans, with decreased urine output, and no complaints of dysurua, pyuria, or hematuria. She is afebrile and without leukocytosis. UA was positive for rare bacteria and leukocytes.  --NS 182ml/hr --Urine Cx --Renal U/S of left kidney 09/02/2015: extrarenal pelvis, no obstruction noted.  Chronic back pain and parastomal hernia pain: Patient is on 52.5 morphine dose equivalents of opioid therapy for chronic pain. She states that she has not taken her pain medication in the last couple of days since she's been feeling weak. Her abdominal pain is not out of the ordinary to her usual chronic pain. She is also on several anticholinergic agents. Ideally in the setting of possible ileus or constipation, we would like to limit opioids and anticholinergic meds as much as possible. Pain had been controlled on lower doses of opioids early in the admission, but is now requiring increasing dosing. --resumed home dosing of  MS Contin (30mg  BID PRN) and Hydrocodone (7.5-325mg  q8hr PRN) --F/u outpatient on trying to adequately control pain with lower dosing of opioids and limiting anticholinergics  Dispo: Anticipated discharge today with f/u in [facility] clinic.   Alphonzo Grieve, MD 09/04/2015,  1:21 PM Pager: [phone]

## 2015-09-04 NOTE — Discharge Summary (Signed)
Name: [name] Bell MRN: [id number] DOB: 1945-06-09 70 y.o. PCP: [name] Rude, MD  Date of Admission: 09/02/2015 12:31 PM Date of Discharge: 09/05/2015 Attending Physician: No att. providers found  Discharge Diagnosis: 1. Constipation Principal Problem:   Constipation Active Problems:   Hyperlipidemia   Colostomy in place   COPD (chronic obstructive pulmonary disease) (HCC)   Extrarenal pelvis   Discharge Medications:   Medication List    STOP taking these medications   bisacodyl 5 MG EC tablet Commonly known as:  DULCOLAX     TAKE these medications   albuterol 108 (90 Base) MCG/ACT inhaler Commonly known as:  PROVENTIL HFA;VENTOLIN HFA Inhale 2 puffs into the lungs every 6 (six) hours as needed for wheezing or shortness of breath.   albuterol (2.5 MG/3ML) 0.083% nebulizer solution Commonly known as:  PROVENTIL Take 3 mLs (2.5 mg total) by nebulization every 6 (six) hours as needed for wheezing or shortness of breath.   alendronate 70 MG tablet Commonly known as:  FOSAMAX Take 1 tablet (70 mg total) by mouth once a week. Take with a full glass of water on an empty stomach.   buPROPion 100 MG tablet Commonly known as:  WELLBUTRIN Take 1 tablet (100 mg total) by mouth 3 (three) times daily. What changed:  when to take this   diphenhydrAMINE 25 MG tablet Commonly known as:  BENADRYL Take 25 mg by mouth every 6 (six) hours as needed for allergies.   docusate sodium 100 MG capsule Commonly known as:  COLACE Take 2 capsules (200 mg total) by mouth daily.   esomeprazole 40 MG capsule Commonly known as:  NEXIUM Take 1 capsule (40 mg total) by mouth 2 (two) times daily before a meal.   fluticasone 50 MCG/ACT nasal spray Commonly known as:  FLONASE Place 2 sprays into both nostrils daily.   HYDROcodone-acetaminophen 7.5-325 MG tablet Commonly known as:  NORCO Take 1 tablet by mouth every 8 (eight) hours as needed for moderate pain. Refill 30 days from last  refill   methocarbamol 500 MG tablet Commonly known as:  ROBAXIN Take 1 tablet (500 mg total) by mouth every 6 (six) hours as needed for muscle spasms.   morphine 15 MG 12 hr tablet Commonly known as:  MS CONTIN Take 2 tablets (30 mg total) by mouth every 12 (twelve) hours. Refill 30 days from last refill   nortriptyline 75 MG capsule Commonly known as:  PAMELOR Take 1 capsule (75 mg total) by mouth at bedtime.   polyethylene glycol packet Commonly known as:  MIRALAX / GLYCOLAX Take 17 g by mouth 2 (two) times daily.   pravastatin 80 MG tablet Commonly known as:  PRAVACHOL Take 1 tablet (80 mg total) by mouth daily.   promethazine 12.5 MG tablet Commonly known as:  PHENERGAN Take 1 tablet (12.5 mg total) by mouth every 6 (six) hours as needed for nausea or vomiting.   senna 8.6 MG Tabs tablet Commonly known as:  SENOKOT Take 2 tablets (17.2 mg total) by mouth at bedtime as needed for mild constipation.       Disposition and follow-up:   [name] Bell was discharged from [facility] in Stable condition.  At the hospital follow up visit please address:  1.  -hydration status, whether patient is taking in adequate PO intake -adherence to laxative/stool softener use -willingness to slowly decrease amount of opioids per day as they are contributing to her constipation and abdominal pain; we began  speaking about this in the hospital and patient was somewhat agreeable to attempting  2.  Labs / imaging needed at time of follow-up: None  3.  Pending labs/ test needing follow-up: None  Follow-up Appointments: Follow-up [place] Follow up on 09/11/2015.   Why:  @ 10:15 am Contact information: [address] [place] [facility] Course by problem list: Principal Problem:   Constipation Active Problems:   Hyperlipidemia   Colostomy in place   COPD (chronic  obstructive pulmonary disease) (HCC)   Extrarenal pelvis   Constipation: Patient presented with nausea, vomiting, abdominal pain and weakness x3 days, accompanied by decreased PO intake and very hard, small stool. She is on chronic opioid therapy (82.5 mEq morphine daily) and laxatives to combat constipation, but admits to not taking laxatives in the last few days. On admission patient was dehydrated and received 1.5L NS + 134m/hr NS continuously. CT abd/pelv positive for minimal gaseous distended small bowel and peristomal hernia encompassing omental fat and transverse colon without signs of edema or obstruction; mild left hydronephrosis and proximal hydroureter. She was advanced from NPO as tolerated and at discharge she had been tolerating diet well and did not require IV fluid support. She was placed on Miralax, Colace, and Senna for constipation.   Chronic back pain and parastomal hernia pain: Patient is on 82.5 morphine dose equivalents of opioid therapy for chronic pain. She states that she has not taken her pain medication in the last couple of days since she's been feeling weak. Her abdominal pain is not out of the ordinary to her usual chronic pain. Pain had been controlled on lower doses of opioids early in the admission, but is now requiring increasing dosing. We slowly titrated her up to her usual home dosing of MS Contin ('30mg'$  BID PRN) and Hydrocodone-ASA (7.5-'325mg'$  q8hr PRN). and d/c her without making changes. Please f/u outpatient on trying to adequately control pain with lower dosing of opioids and limiting anticholinergics.  L hydroureter and hydronephrosis:On admission, patient had finding of mild left hydroureter and hydronephrosis on CT abd/pelvis not shown on previous scans, with decreased urine output, and no complaints of dysurua, pyuria, or hematuria. She remained afebrile and without leukocytosis. UA was positive for rare bacteria and leukocytes. Urine culture was negative. Renal  U/S of left kidney 09/02/2015: extrarenal pelvis, no obstruction noted. No further workup or treatment was pursued.  Discharge Vitals:   BP (!) 117/46 (BP Location: Right Arm)   Pulse 97   Temp 98.1 F (36.7 C)   Resp (!) 24   Ht '5\' 4"'$  (1.626 m)   Wt 62.6 kg (138 lb)   LMP 08/25/1980   SpO2 97%   BMI 23.69 kg/m   Pertinent Labs, Studies, and Procedures:  Results for BTAKENYA, TRAVAGLINI(MRN [id number] as of 09/06/2015 13:24  Ref. Range 09/03/2015 07:08  Sodium Latest Ref Range: 135 - 145 mmol/L 140  Potassium Latest Ref Range: 3.5 - 5.1 mmol/L 3.6  Chloride Latest Ref Range: 101 - 111 mmol/L 106  CO2 Latest Ref Range: 22 - 32 mmol/L 24  BUN Latest Ref Range: 6 - 20 mg/dL 6  Creatinine Latest Ref Range: 0.44 - 1.00 mg/dL 0.68  Calcium Latest Ref Range: 8.9 - 10.3 mg/dL 7.9 (L)  EGFR (Non-African Amer.) Latest Ref Range: >60 mL/min >60  EGFR (African American) Latest Ref Range: >60 mL/min >60  Glucose Latest Ref Range: 65 - 99 mg/dL 82  Anion gap Latest Ref Range: 5 - 15  10    Ref. Range 09/03/2015 07:08  WBC Latest Ref Range: 4.0 - 10.5 K/uL 7.0  RBC Latest Ref Range: 3.87 - 5.11 MIL/uL 4.99  Hemoglobin Latest Ref Range: 12.0 - 15.0 g/dL 14.6  HCT Latest Ref Range: 36.0 - 46.0 % 45.3  MCV Latest Ref Range: 78.0 - 100.0 fL 90.8  MCH Latest Ref Range: 26.0 - 34.0 pg 29.3  MCHC Latest Ref Range: 30.0 - 36.0 g/dL 32.2  RDW Latest Ref Range: 11.5 - 15.5 % 13.9  Platelets Latest Ref Range: 150 - 400 K/uL 249   CT abd/pelv w/ contrast 09/02/2015:  -Minimal gaseous distended small bowel loops in lower abdomen. Mild ileus cannot be excluded -Again noted a parastomal hernia containing omental fat and segment of transverse colon with moderate stool and gas. No mesenteric edema. Exiting loop of descending colon in colostomy bag is decompressed small caliber. There is no inflammation or thickening of the wall this colonic loop. Some stool is noted in colostomy bag therefore unlikely significant  colonic obstruction. Clinical correlation is necessary. -Mild left hydronephrosis and minimal proximal left hydroureter. -There is geographic fatty infiltration of the liver. -Mild degenerative changes and osteopenia thoracolumbar spine. Stable mild compression deformity upper endplate of Z61 vertebral body. -No ascites or free abdominal air. -Status post hysterectomy  Renal U/S 09/02/2015:  Left extrarenal pelvis; No evidence for acute  abnormality.  Discharge Instructions: Discharge Instructions    Discharge instructions    Complete by:  As directed   Please take the constipation medications to help keep your bowel regular.  Work with your physician Dr. Arcelia Jew to reduce your chronic pain medication dosing, they very commonly cause severe constipation among other side effects.   Increase activity slowly    Complete by:  As directed      Signed: Alphonzo Grieve, MD 09/05/2015, 9:33 PM   Pager: [phone]

## 2015-09-04 NOTE — Progress Notes (Signed)
Initial Nutrition Assessment  DOCUMENTATION CODES:   Not applicable  INTERVENTION:  Continue Boost Breeze po TID, each supplement provides 250 kcal and 9 grams of protein.  Encourage adequate PO intake.   NUTRITION DIAGNOSIS:   Increased nutrient needs related to chronic illness as evidenced by estimated needs.  GOAL:   Patient will meet greater than or equal to 90% of their needs  MONITOR:   PO intake, Supplement acceptance, Diet advancement, Labs, Weight trends, Skin, I & O's  REASON FOR ASSESSMENT:   Malnutrition Screening Tool    ASSESSMENT:   70 yo female with PMH of blindness, COPD, duodenal ulcer, ischemic colitis, duodenal obstruction s/p dilation, ruptured diverticula s/p colostomy 2013, chronic opioid therapy, and parastomal hernia presenting to the ED via EMS for weakness x 3 days with nausea, vomiting, and constipation.  Pt is currently on a full liquid diet. No percent meal completion recorded, however pt reports ~50% intake this AM. Pt reports appetite has been improving. Pt reports the 3 days PTA, she had poor po intake. Diet recall includes consumption of only one meal a day which consisted of a ginger ale and a peanut butter sandwich. Pt reports prior to current acute illness, pt usually consumes 2 meals a day with Boost Breeze 2-3 times daily. Pt currently has Boost Breeze ordered and has been consuming them. RD to continue with current orders. Per Epic weight records, pt with no significant weight loss.   Nutrition-Focused physical exam completed. Findings are mild to moderate fat depletion, mild to moderate muscle depletion, and no edema. Depletion may be related to the natural aging process.  Labs and medications reviewed.   Diet Order:  Diet full liquid Room service appropriate? Yes; Fluid consistency: Thin  Skin:  Reviewed, no issues  Last BM:  8/9  Height:   Ht Readings from Last 1 Encounters:  09/02/15 5\' 4"  (1.626 m)    Weight:   Wt  Readings from Last 1 Encounters:  09/02/15 138 lb (62.6 kg)    Ideal Body Weight:  54.5 kg  BMI:  Body mass index is 23.69 kg/m.  Estimated Nutritional Needs:   Kcal:  1650-1850  Protein:  75-85 grams  Fluid:  1.6 - 1.8 L/day  EDUCATION NEEDS:   No education needs identified at this time  Corrin Parker, MS, RD, LDN Pager # [phone] After hours/ weekend pager # [phone]

## 2015-09-04 NOTE — Progress Notes (Signed)
Discharge instructions given by [facility] RN ,IV access was dcd.V/S stable ;pt.went home in fair condition via [facility].

## 2015-09-04 NOTE — Care Management Note (Signed)
Case Management Note  Patient Details  Name: [name] Bell MRN: [id number] Date of Birth: 1945-10-08  Subjective/Objective:                    Action/Plan: Plan is to d/c to home today, [facility] independent living facility.  Expected Discharge Date:     09/04/2015              Expected Discharge Plan:  Home/Self Care (Independent Living facility)  Discharge planning Services  CM Consult  Status of Service:  In process, will continue to follow  If discussed at Long Length of Stay Meetings, dates discussed:    Additional Comments:  Sharin Mons, RN 09/04/2015, 10:50 AM

## 2015-09-05 DIAGNOSIS — Q638 Other specified congenital malformations of kidney: Secondary | ICD-10-CM

## 2015-09-05 DIAGNOSIS — IMO0001 Reserved for inherently not codable concepts without codable children: Secondary | ICD-10-CM

## 2015-09-10 ENCOUNTER — Other Ambulatory Visit: Payer: Self-pay | Admitting: *Deleted

## 2015-09-10 DIAGNOSIS — F1129 Opioid dependence with unspecified opioid-induced disorder: Secondary | ICD-10-CM

## 2015-09-10 DIAGNOSIS — Z933 Colostomy status: Secondary | ICD-10-CM | POA: Diagnosis not present

## 2015-09-11 ENCOUNTER — Ambulatory Visit (INDEPENDENT_AMBULATORY_CARE_PROVIDER_SITE_OTHER): Payer: Medicare Other | Admitting: Internal Medicine

## 2015-09-11 ENCOUNTER — Encounter: Payer: Self-pay | Admitting: Internal Medicine

## 2015-09-11 DIAGNOSIS — K5900 Constipation, unspecified: Secondary | ICD-10-CM

## 2015-09-11 DIAGNOSIS — Z933 Colostomy status: Secondary | ICD-10-CM | POA: Diagnosis not present

## 2015-09-11 DIAGNOSIS — Z79891 Long term (current) use of opiate analgesic: Secondary | ICD-10-CM

## 2015-09-11 DIAGNOSIS — R000 Tachycardia, unspecified: Secondary | ICD-10-CM

## 2015-09-11 HISTORY — DX: Tachycardia, unspecified: R00.0

## 2015-09-11 MED ORDER — DOCUSATE SODIUM 100 MG PO CAPS: 200.0000 mg | ORAL_CAPSULE | Freq: Every day | ORAL | 2 refills | Status: DC

## 2015-09-11 MED ORDER — POLYETHYLENE GLYCOL 3350 17 G PO PACK: 17.0000 g | PACK | Freq: Every day | ORAL | 3 refills | Status: DC

## 2015-09-11 MED ORDER — SENNA 8.6 MG PO TABS: 2.0000 | ORAL_TABLET | Freq: Every evening | ORAL | 3 refills | Status: DC | PRN

## 2015-09-11 NOTE — Assessment & Plan Note (Signed)
Patient was recently admitted to the hospital for abdominal pain which was determined to be secondary to severe constipation likely opioid induced from her chronic pain regimen. Patient was started on Miralax BID, Senna 2 tabs QHS and Docusate 200 mg QD with improvement. She returns to the clinic today stating she feels much better, but is no experiencing loose stools- filling her ostomy bag about 4 times per day. She denies fever, chills, abdominal pain, nausea or vomiting. Upon discussion with my attending, this is the appropriate caliber and number of stools she should be having given her high opioid regimen and presence of an ostomy.   Plan:  -Continue Miralax as 17 grams QD -Continue Senna 2 tabs QHS -Continue Colace/Ducosate 200 mg QD for now, but can consider stopping in the future if stool output is bothersome to her

## 2015-09-11 NOTE — Patient Instructions (Signed)
FOR YOUR STOOLS: -CONTINUE TAKING MIRALAX ONCE A DAY -TAKE DOCUSATE (COLACE) 2 PILLS ONCE A DAY, CUT BACK TO 1 PILL ONCE A DAY IF YOU CONTINUE TO HAVE LOOSE STOOLS -STOP TAKING SENNA  FOLLOW UP IN 2 WEEKS

## 2015-09-11 NOTE — Assessment & Plan Note (Signed)
Patient's heart rate on exam is tachycardic, but regular rhythm. She admits to a long history of tachycardia and denies history of arrhythmia. Her last EKG showed sinus tachycardia. She is normotensive on exam, but at follow up visit, I would consider adding a BB if patient is agreeable.   Plan: -Consider addition of BB if persistently tachycardic to avoid tachycardia induced cardiomyopathy

## 2015-09-11 NOTE — Progress Notes (Signed)
    CC: loose stoola  HPI: [name] is a 70 y.o. female with PMHx of Chronic pain, DDD, osteoporosis who presents to the clinic for constipation.  Patient was recently admitted to the hospital for abdominal pain which was determined to be secondary to severe constipation likely opioid induced from her chronic pain regimen. Patient was started on Miralax BID, Senna 2 tabs QHS and Docusate 200 mg QD with improvement. She returns to the clinic today stating she feels much better, but is no experiencing loose stools- filling her ostomy bag about 4 times per day. She denies fever, chills, abdominal pain, nausea or vomiting.   Past Medical History:  Diagnosis Date  . Acute sinusitis 06/30/2011  . Anxiety   . Basal cell carcinoma    "left cheek"  . Bleeding ulcer   . Blind in both eyes   . Chronic back pain   . Degenerative disk disease    This is interscapular, mild compression frx of T12 superior endplate with Schmorl's node. Seen on CT in 2/08  . Depression   . Duodenal ulcer 11/08   With hemorrhage and obstruction  . History of blood transfusion    "related to bleeding from intestines and vomiting blood"  . Ischemic colitis ([place]) 07/30/2011   2009 by endoscopy   . Macular degeneration, wet ([place])   . Osteomyelitis, jaw acute 11/08   Started while in the hospital abcess showed GNR . SP debridement by Dr. Lilli Few,  Priscella Mann S Bovis sp 4  weeks of Pen V started on 05/19/07  with additional 2 weeks in 07/04/07  . Perforated bowel [facility])    surgery july 2013  . Superior mesenteric artery syndrome ([place]) 11/08   sp dilation by Dr. Watt Climes, Pt may require bowel resetion if sx recur    Review of Systems: A complete ROS was negative except as noted in HPI.   Physical Exam: Vitals:   09/11/15 1026  BP: 137/73  Pulse: (!) 112  Temp: 98.1 F (36.7 C)  TempSrc: Oral  SpO2: 96%  Weight: 142 lb 1.6 oz (64.5 kg)  Height: 5\' 4"  (1.626 m)   General: Vital signs reviewed.  Patient is  chronically ill appearing, in no acute distress and cooperative with exam.  Cardiovascular: Tachycardic, regular rhythm, S1 normal, S2 normal, no murmurs, gallops, or rubs. Pulmonary/Chest: Clear to auscultation bilaterally, no wheezes, rales, or rhonchi. Abdominal: Soft, non-tender, non-distended, BS +, LLQ ostomy with liquid brown stool. Skin: Warm, dry and intact. No rashes or erythema. Psychiatric: Normal mood and affect. speech and behavior is normal. Cognition and memory are grossly normal.   Assessment & Plan:  See encounters tab for problem based medical decision making. Patient discussed with Dr. Evette Doffing

## 2015-09-12 NOTE — Progress Notes (Signed)
Internal Medicine Clinic Attending  Case discussed with Dr. Burns soon after the resident saw the patient.  We reviewed the resident's history and exam and pertinent patient test results.  I agree with the assessment, diagnosis, and plan of care documented in the resident's note. 

## 2015-09-16 ENCOUNTER — Other Ambulatory Visit: Payer: Self-pay | Admitting: *Deleted

## 2015-09-16 DIAGNOSIS — J441 Chronic obstructive pulmonary disease with (acute) exacerbation: Secondary | ICD-10-CM

## 2015-09-16 MED ORDER — ALBUTEROL SULFATE (2.5 MG/3ML) 0.083% IN NEBU: 2.5000 mg | INHALATION_SOLUTION | Freq: Four times a day (QID) | RESPIRATORY_TRACT | 12 refills | Status: DC | PRN

## 2015-09-16 MED ORDER — METHOCARBAMOL 500 MG PO TABS: 500.0000 mg | ORAL_TABLET | Freq: Four times a day (QID) | ORAL | 2 refills | Status: DC | PRN

## 2015-09-16 MED ORDER — PROMETHAZINE HCL 12.5 MG PO TABS: 12.5000 mg | ORAL_TABLET | Freq: Four times a day (QID) | ORAL | 2 refills | Status: DC | PRN

## 2015-09-16 NOTE — Telephone Encounter (Signed)
Called into pharmacy

## 2015-09-16 NOTE — Telephone Encounter (Signed)
Can we phone this in?

## 2015-09-24 ENCOUNTER — Encounter: Payer: Self-pay | Admitting: Internal Medicine

## 2015-09-24 ENCOUNTER — Ambulatory Visit (INDEPENDENT_AMBULATORY_CARE_PROVIDER_SITE_OTHER): Payer: Medicare Other | Admitting: Internal Medicine

## 2015-09-24 VITALS — BP 142/51 | HR 104 | Temp 98.0°F | Resp 20 | Ht 62.0 in | Wt 143.2 lb

## 2015-09-24 DIAGNOSIS — K5900 Constipation, unspecified: Secondary | ICD-10-CM

## 2015-09-24 DIAGNOSIS — F1129 Opioid dependence with unspecified opioid-induced disorder: Secondary | ICD-10-CM

## 2015-09-24 DIAGNOSIS — K5903 Drug induced constipation: Secondary | ICD-10-CM | POA: Diagnosis not present

## 2015-09-24 DIAGNOSIS — T40605A Adverse effect of unspecified narcotics, initial encounter: Secondary | ICD-10-CM | POA: Diagnosis not present

## 2015-09-24 DIAGNOSIS — Z23 Encounter for immunization: Secondary | ICD-10-CM | POA: Diagnosis not present

## 2015-09-24 DIAGNOSIS — Z933 Colostomy status: Secondary | ICD-10-CM

## 2015-09-24 DIAGNOSIS — Z79891 Long term (current) use of opiate analgesic: Secondary | ICD-10-CM

## 2015-09-24 DIAGNOSIS — G8929 Other chronic pain: Secondary | ICD-10-CM | POA: Diagnosis not present

## 2015-09-24 DIAGNOSIS — M545 Low back pain: Secondary | ICD-10-CM

## 2015-09-24 DIAGNOSIS — R000 Tachycardia, unspecified: Secondary | ICD-10-CM

## 2015-09-24 MED ORDER — MORPHINE SULFATE ER 15 MG PO TBCR: 30.0000 mg | EXTENDED_RELEASE_TABLET | Freq: Two times a day (BID) | ORAL | 0 refills | Status: DC

## 2015-09-24 MED ORDER — CARVEDILOL 3.125 MG PO TABS
3.1250 mg | ORAL_TABLET | Freq: Two times a day (BID) | ORAL | 2 refills | Status: DC
Start: 2015-09-24 — End: 2016-01-13

## 2015-09-24 MED ORDER — HYDROCODONE-ACETAMINOPHEN 7.5-325 MG PO TABS: 1.0000 | ORAL_TABLET | Freq: Three times a day (TID) | ORAL | 0 refills | Status: DC | PRN

## 2015-09-24 NOTE — Patient Instructions (Signed)
General Instructions: - We will start a taper for your pain medications - Next month you will not have any changes in your doses - In October, you will have a total of 81 pills (down from 90) - I would like you to see me in the middle of October so we can see how the taper is going - Start Coreg 3.125 mg twice daily to help decrease your heart rate  - Please bring back your signed advanced directives forms to your next visit   Thank you for bringing your medicines today. This helps [place] keep you safe from mistakes.   Progress Toward Treatment Goals:  Treatment Goal 05/11/2012  Stop smoking smoking the same amount    Self Care Goals & Plans:  Self Care Goal 01/15/2014  Manage my medications take my medicines as prescribed; bring my medications to every visit; refill my medications on time  Eat healthy foods eat more vegetables; eat foods that are low in salt; eat baked foods instead of fried foods  Be physically active find an activity I enjoy  Stop smoking -    No flowsheet data found.   Care Management & Community Referrals:  Referral 05/31/2013  Referrals made for care management support -  Referrals made to community resources none

## 2015-09-24 NOTE — Assessment & Plan Note (Addendum)
We discussed that she is on a high opioid regimen and given her recent hospitalization for abdominal pain/constipation related to narcotics we need to start a taper to prevent future events like this one from happening. Patient was agreeable to a taper and we agreed to slowly taper (monthly). She was given 2 prescriptions for Norco 7.5-325 mg Q8H PRN #90 tablets on 09/04/15 so she will officially start the taper in October. I gave her one prescription for Norco 7.5-325 mg Q8H PRN #81 tablets today. On her current regimen she is able to perform all her ADLs and is mobile with a cane. I would like for her to continue to be able to perform all her ADLs with a decreased opioid regimen while decreasing her need for laxatives/stool softeners. I think once her constipation does not require any laxatives and she is still able to perform her ADLs then she will have reached an ideal dose to control her pain. She will continue her MS Contin 30 mg Q12H for now. Likely will need to taper her MS Contin at some point but I imagine this will not be anytime soon. Can also consider alternative pain medications such as Cymbalta to help control her pain. I obtained a UDS today. I will see her in early November to assess how the taper is going before her opioid prescription runs out. Will renew her pain medication contract once she is on a stable opioid regimen.

## 2015-09-24 NOTE — Progress Notes (Signed)
CC: Chronic back pain   HPI:  [name] Bell is a 70 y.o. woman with PMHx as noted below who presents today for follow up of her chronic pain management.  Chronic Pain: She has chronic pain related to her lower back and associated with her abdominal hernia/colostomy. She was recently hospitalized for abdominal pain and constipation that was attributed to her high narcotic regimen. She is currently taking Norco 7.5-325 mg Q8H PRN and Morphine 30 mg Q12H. She does take the Norco 3 times daily. She reports the pain medication helps her with mobility and allows her to perform her ADLs. She is open to the idea of tapering down her narcotic regimen as she does not want to be hospitalized again for severe constipation and abdominal pain.   Constipation 2/2 Narcotic Use: Recent hospitalization as noted above. She was placed on a bowel regimen including Senokot 2 tabs QHS, Miralax 17g BID, and Colace 200 mg 4 times daily. She reports having at least 4 BMs daily. She states this has been embarrassing for her as she will have "surprise BMs" while out at dinner with friends and has to deal with "messy clean up" in public restrooms. She states she stopped taking her stool softeners and laxatives for 1 week after an embarrassing episode with her friends but has now restarted all medications. She states initially on the regimen she had "rocks" coming out but now stool is softer.   Sinus tachycardia: Has hx of sinus tachycardia due to unknown cause. Today her HR is 104. She reports having tachycardia since she was a child. She admits she notices palpitations especially at night while laying down to go to sleep. TSH normal in 2011. No hx DVT or PE.   Past Medical History:  Diagnosis Date  . Acute sinusitis 06/30/2011  . Anxiety   . Basal cell carcinoma    "left cheek"  . Bleeding ulcer   . Blind in both eyes   . Chronic back pain   . Degenerative disk disease    This is interscapular, mild compression frx  of T12 superior endplate with Schmorl's node. Seen on CT in 2/08  . Depression   . Duodenal ulcer 11/08   With hemorrhage and obstruction  . History of blood transfusion    "related to bleeding from intestines and vomiting blood"  . Ischemic colitis ([place]) 07/30/2011   2009 by endoscopy   . Macular degeneration, wet ([place])   . Osteomyelitis, jaw acute 11/08   Started while in the hospital abcess showed GNR . SP debridement by Dr. Lilli Few,  Priscella Mann S Bovis sp 4  weeks of Pen V started on 05/19/07  with additional 2 weeks in 07/04/07  . Perforated bowel [facility])    surgery july 2013  . Superior mesenteric artery syndrome ([place]) 11/08   sp dilation by Dr. Watt Climes, Pt may require bowel resetion if sx recur    Review of Systems:  All systems negative except per HPI.  Physical Exam: General: alert, sitting up, pleasant, NAD HEENT: [place]/AT, EOMI, sclera anicteric, mucus membranes moist CV: tachycardic in 100s, no m/g/r Pulm: CTA bilaterally, breaths non-labored Abd: BS+, soft, non-tender, colostomy bag present filled with gas and small amount of stool, mucosa appears pink  Ext: warm, no edema Neuro: alert and oriented x 3 Skin: No erythema or tenderness around colostomy bag.  Vitals:   09/24/15 1512  BP: (!) 142/51  Pulse: (!) 104  Resp: 20  Temp: 98 F (36.7 C)  TempSrc: Oral  SpO2: 97%  Weight: 143 lb 3.2 oz (65 kg)  Height: 5\' 2"  (1.575 m)    Assessment & Plan:   See Encounters Tab for problem based charting.  Patient discussed with Dr. Lynnae January

## 2015-09-24 NOTE — Assessment & Plan Note (Addendum)
Started Coreg 3.125 mg BID today to prevent tachycardia-induced cardiomyopathy. Unclear why she is tachycardic but I do not see any obvious sources at this time. Possibly could be volume depletion? Will assess her fluid intake at next visit. Her TSH was normal in 2011 but this could be rechecked since it has been several years. No hx DVT or PE.  Will reassess HR and BP at next visit.

## 2015-09-24 NOTE — Assessment & Plan Note (Signed)
She will start a taper on her opioids in early October. Will continue the same bowel regimen of Miralax, Senokot, and Colace for now. Hopefully her bowel medication needs for constipation will slowly reduce as her opioid regimen is decreased. Continue to monitor.

## 2015-09-25 ENCOUNTER — Ambulatory Visit: Payer: Medicare Other

## 2015-09-25 NOTE — Progress Notes (Signed)
Internal Medicine Clinic Attending  Case discussed with Dr. Rivet soon after the resident saw the patient.  We reviewed the resident's history and exam and pertinent patient test results.  I agree with the assessment, diagnosis, and plan of care documented in the resident's note.  

## 2015-10-04 LAB — TOXASSURE SELECT,+ANTIDEPR,UR

## 2015-10-08 ENCOUNTER — Ambulatory Visit: Payer: Medicare Other

## 2015-10-31 ENCOUNTER — Other Ambulatory Visit: Payer: Self-pay | Admitting: *Deleted

## 2015-10-31 DIAGNOSIS — K219 Gastro-esophageal reflux disease without esophagitis: Secondary | ICD-10-CM

## 2015-10-31 MED ORDER — ESOMEPRAZOLE MAGNESIUM 40 MG PO CPDR: 40.0000 mg | DELAYED_RELEASE_CAPSULE | Freq: Two times a day (BID) | ORAL | 3 refills | Status: DC

## 2015-11-07 ENCOUNTER — Ambulatory Visit (INDEPENDENT_AMBULATORY_CARE_PROVIDER_SITE_OTHER): Payer: Medicare Other | Admitting: Internal Medicine

## 2015-11-07 DIAGNOSIS — Z79891 Long term (current) use of opiate analgesic: Secondary | ICD-10-CM

## 2015-11-07 DIAGNOSIS — K5903 Drug induced constipation: Secondary | ICD-10-CM

## 2015-11-07 DIAGNOSIS — T402X5D Adverse effect of other opioids, subsequent encounter: Secondary | ICD-10-CM | POA: Diagnosis not present

## 2015-11-07 DIAGNOSIS — F1129 Opioid dependence with unspecified opioid-induced disorder: Secondary | ICD-10-CM

## 2015-11-07 DIAGNOSIS — K5900 Constipation, unspecified: Secondary | ICD-10-CM

## 2015-11-07 DIAGNOSIS — Z933 Colostomy status: Secondary | ICD-10-CM | POA: Diagnosis not present

## 2015-11-07 MED ORDER — HYDROCODONE-ACETAMINOPHEN 7.5-325 MG PO TABS: 1.0000 | ORAL_TABLET | Freq: Three times a day (TID) | ORAL | 0 refills | Status: DC | PRN

## 2015-11-07 MED ORDER — POLYETHYLENE GLYCOL 3350 17 G PO PACK: 17.0000 g | PACK | Freq: Every day | ORAL | 3 refills | Status: DC | PRN

## 2015-11-07 MED ORDER — MORPHINE SULFATE ER 15 MG PO TBCR: 30.0000 mg | EXTENDED_RELEASE_TABLET | Freq: Two times a day (BID) | ORAL | 0 refills | Status: DC

## 2015-11-07 NOTE — Assessment & Plan Note (Signed)
Patient has been taking norco 7.5-325 mg q8h prn #90/month and MS Contin 30 mg (15 mg tabs) q12h #120/month. Due to her recent severe constipation she was advised to taper down her narcotics to help provide relief as well as reduce other complications associated with chronic opioid use. She was in the hospital in August 2017 and on discharge (8/10) was reportedly provided 2 one month prescriptions of both her Norco and MS Contin.   She was seen by her PCP on 8/29 and a mutual decesion to taper her opioids were made. Based on information suggesting she had prescriptions for 2 months (August and September), she was given a 1 month Rx for the first taper dose of Norco #81/month to begin in October. Patient however states that when she was discharged from the hospital she only received a prescription for 1 month supply of her pain meds and her pharmacy did not have another Rx on file. Because of this, she filled her Rx for the [place] #81 and regular MS Contin on 10/05/15 and returns today as she has no further refills.  I have reviewed the [place] Narcotic Database which appears to be appropriate. Chart is also reviewed and I am only seeing evidence of 1 month prescription of her pain medications provided when she was discharged from the hospital. Patient does appear to be appropriate in her history. She does state she did well with the tapered dose of Norco 7.5-325 mg q8h prn #81. Will refill 1 month supply of her Norco and MS Contin. She will follow up with her PCP on 12/03/15 for further management. -Refill Norco 7.5-325 mg q8h prn #81 one month supply -Refill MS Contin 30 mg q12h (15 mg tabs) #120 one month supply -f/u with PCP

## 2015-11-07 NOTE — Patient Instructions (Signed)
It was a pleasure to meet you [name] Bell.  I am refilling your Norco 7.5-325 mg every 8 hours as needed #81 for 1 month supply as well as your MS Contin 30 mg every 12 hours for 1 month supply.  For your sticky stools, stop taking the Miralax except for as needed. Try to decrease the amount of Coca-Cola you drink. You can also try over the counter fiber supplements.   Please follow up with Dr. Arcelia Jew on December 03, 2015.

## 2015-11-07 NOTE — Progress Notes (Signed)
CC: "Sticky stool"  HPI:  [name] is a 70 y.o. female with PMH as listed below including ruptured diverticula s/p colostomy and chronic opioid use who presents for management of these.  Colostomy: Patient with LLQ colostomy. She has recently been treated for constipation secondary to opioid use with Miralax, Senna, and Colace with improved bowel movements. She has 4-5 movements a day now. She does state that for the last couple weeks her stool has become "sticky" and very difficult for her to clean her colostomy bag. She has been drinking about 8 cups of water a day to help this issue. She does report drinking two 20 oz Coca-Colas a day as well. She is not having any constipation or watery diarrhea. Stool is semi-soft. She has a nurse who helps with her colostomy and reports that she has no recent sign of infection or dislodging of her colostomy.  Chronic opioid use: Patient has been taking norco 7.5-325 mg q8h prn #90/month and MS Contin 30 mg (15 mg tabs) q12h #120/month. Due to her recent severe constipation she was advised to taper down her narcotics to help provide relief as well as reduce other complications associated with chronic opioid use. She was in the hospital in August 2017 and on discharge (8/10) was reportedly provided 2 one month prescriptions of both her Norco and MS Contin.   She was seen by her PCP on 8/29 and a mutual decesion to taper her opioids were made. Based on information suggesting she had prescriptions for 2 months (August and September), she was given a 1 month Rx for the first taper dose of Norco #81/month to begin in October. Patient however states that when she was discharged from the hospital she only received a prescription for 1 month supply of her pain meds and her pharmacy did not have another Rx on file. Because of this, she filled her Rx for the [place] #81 and regular MS Contin on 10/05/15 and returns today as she has no further refills.  Past Medical  History:  Diagnosis Date  . Acute sinusitis 06/30/2011  . Anxiety   . Basal cell carcinoma    "left cheek"  . Bleeding ulcer   . Blind in both eyes   . Chronic back pain   . Degenerative disk disease    This is interscapular, mild compression frx of T12 superior endplate with Schmorl's node. Seen on CT in 2/08  . Depression   . Duodenal ulcer 11/08   With hemorrhage and obstruction  . History of blood transfusion    "related to bleeding from intestines and vomiting blood"  . Ischemic colitis ([place]) 07/30/2011   2009 by endoscopy   . Macular degeneration, wet ([place])   . Osteomyelitis, jaw acute 11/08   Started while in the hospital abcess showed GNR . SP debridement by Dr. Lilli Few,  Priscella Mann S Bovis sp 4  weeks of Pen V started on 05/19/07  with additional 2 weeks in 07/04/07  . Perforated bowel [facility])    surgery july 2013  . Superior mesenteric artery syndrome ([employer]) 11/08   sp dilation by Dr. Watt Climes, Pt may require bowel resetion if sx recur    Review of Systems:   Review of Systems  Constitutional: Negative for diaphoresis and fever.  Respiratory: Negative for shortness of breath.   Cardiovascular: Negative for chest pain and palpitations.  Gastrointestinal: Negative for constipation and diarrhea.       Colostomy in place, sticky stools. No infection at  colostomy site, pink stoma.     Physical Exam:  Vitals:   11/07/15 1514  BP: (!) 117/42  Pulse: 91  Temp: 98.2 F (36.8 C)  TempSrc: Oral  SpO2: 97%  Weight: 134 lb 14.4 oz (61.2 kg)   Physical Exam  Constitutional: She appears well-nourished.  Pleasant woman, no acute distress  Cardiovascular: Normal rate and regular rhythm.   No murmur heard. Pulmonary/Chest: Effort normal. No respiratory distress. She has no wheezes. She has no rales.  Abdominal:  Colostomy in place LLQ with dark semi-solid stool. Large hernia LLQ with slight tenderness to touch.  Skin: She is not diaphoretic.    Assessment & Plan:   See Encounters  Tab for problem based charting.  Patient discussed with Dr. Angelia Mould

## 2015-11-07 NOTE — Assessment & Plan Note (Signed)
Patient with LLQ colostomy. She has recently been treated for constipation secondary to opioid use with Miralax, Senna, and Colace with improved bowel movements. She has 4-5 movements a day now. She does state that for the last couple weeks her stool has become "sticky" and very difficult for her to clean her colostomy bag. She has been drinking about 8 cups of water a day to help this issue. She does report drinking two 20 oz Coca-Colas a day as well. She is not having any constipation or watery diarrhea. Stool is semi-soft. She has a nurse who helps with her colostomy and reports that she has no recent sign of infection or dislodging of her colostomy.   Constipation appears to be much improved. Main issue now is the "sticky" stool which is problematic for patient when trying to clean her colostomy bag. Will suggest patient change Miralax to prn only, reduce amount of cola she is drinking, and try fiber supplement. -Continue Senna and Colace -Change Miralax to prn, patient will hold off on taking unless needed -Reduce soda per day -Continue adequate hydration with water -Try fiber supplement

## 2015-11-12 NOTE — Progress Notes (Signed)
Internal Medicine Clinic Attending  Case discussed with Dr. Zada Finders at the time of the visit.  We reviewed the resident's history and exam and pertinent patient test results.  I agree with the assessment, diagnosis, and plan of care documented in the resident's note. Dr Posey Pronto and I reviewed the [place] controlled medication database as well as extensively reviewed our own EHR and it appears she was only provided 1 prescription on her narcotics therefore we provided the tapered dose today.

## 2015-12-02 ENCOUNTER — Telehealth: Payer: Self-pay | Admitting: Internal Medicine

## 2015-12-02 NOTE — Telephone Encounter (Signed)
APT. REMINDER CALL, LMTCB °

## 2015-12-03 ENCOUNTER — Ambulatory Visit (INDEPENDENT_AMBULATORY_CARE_PROVIDER_SITE_OTHER): Payer: Medicare Other | Admitting: Internal Medicine

## 2015-12-03 ENCOUNTER — Encounter: Payer: Self-pay | Admitting: Internal Medicine

## 2015-12-03 VITALS — BP 112/51 | HR 88 | Temp 97.9°F | Ht 62.0 in | Wt 136.6 lb

## 2015-12-03 DIAGNOSIS — R1013 Epigastric pain: Secondary | ICD-10-CM | POA: Diagnosis not present

## 2015-12-03 DIAGNOSIS — F1129 Opioid dependence with unspecified opioid-induced disorder: Secondary | ICD-10-CM

## 2015-12-03 DIAGNOSIS — Z79899 Other long term (current) drug therapy: Secondary | ICD-10-CM

## 2015-12-03 DIAGNOSIS — G8921 Chronic pain due to trauma: Secondary | ICD-10-CM | POA: Diagnosis not present

## 2015-12-03 DIAGNOSIS — K5900 Constipation, unspecified: Secondary | ICD-10-CM | POA: Diagnosis not present

## 2015-12-03 DIAGNOSIS — Z79891 Long term (current) use of opiate analgesic: Secondary | ICD-10-CM

## 2015-12-03 DIAGNOSIS — F329 Major depressive disorder, single episode, unspecified: Secondary | ICD-10-CM

## 2015-12-03 DIAGNOSIS — M545 Low back pain: Secondary | ICD-10-CM | POA: Diagnosis not present

## 2015-12-03 DIAGNOSIS — Z87891 Personal history of nicotine dependence: Secondary | ICD-10-CM

## 2015-12-03 DIAGNOSIS — Z933 Colostomy status: Secondary | ICD-10-CM

## 2015-12-03 DIAGNOSIS — R000 Tachycardia, unspecified: Secondary | ICD-10-CM

## 2015-12-03 DIAGNOSIS — F3342 Major depressive disorder, recurrent, in full remission: Secondary | ICD-10-CM

## 2015-12-03 MED ORDER — HYDROCODONE-ACETAMINOPHEN 7.5-325 MG PO TABS: 1.0000 | ORAL_TABLET | Freq: Three times a day (TID) | ORAL | 0 refills | Status: DC | PRN

## 2015-12-03 MED ORDER — MORPHINE SULFATE ER 15 MG PO TBCR: 30.0000 mg | EXTENDED_RELEASE_TABLET | Freq: Two times a day (BID) | ORAL | 0 refills | Status: DC

## 2015-12-03 NOTE — Assessment & Plan Note (Signed)
Improved.  Continue current regimen

## 2015-12-03 NOTE — Assessment & Plan Note (Signed)
Stable. Continue Wellbutrin 100 mg 4 times daily.

## 2015-12-03 NOTE — Assessment & Plan Note (Signed)
Her dyspepsia in the setting of her age and while on maximal PPI therapy is concerning. She reports getting an EGD "many years ago" but I am unable to find this report. I see endoscopies that were done in 2008/2009 but only an office visit reports shows up when I bring this up in EPIC. I will refer her to GI (Dr. Watt Climes) to see if further evaluation with EGD is warranted.

## 2015-12-03 NOTE — Patient Instructions (Signed)
General Instructions: - We will continue your current dose of pain medications since you are stable on this dosing - I would like to refer you to Gastroenterology since you are having reflux symptoms despite being on medication - Will have you follow up in 3 months   Please bring your medicines with you each time you come to clinic.  Medicines may include prescription medications, over-the-counter medications, herbal remedies, eye drops, vitamins, or other pills.   Progress Toward Treatment Goals:  Treatment Goal 05/11/2012  Stop smoking smoking the same amount    Self Care Goals & Plans:  Self Care Goal 01/15/2014  Manage my medications take my medicines as prescribed; bring my medications to every visit; refill my medications on time  Eat healthy foods eat more vegetables; eat foods that are low in salt; eat baked foods instead of fried foods  Be physically active find an activity I enjoy  Stop smoking -    No flowsheet data found.   Care Management & Community Referrals:  Referral 05/31/2013  Referrals made for care management support -  Referrals made to community resources none

## 2015-12-03 NOTE — Assessment & Plan Note (Signed)
She is doing well on the new tapered dose. Her constipation has improved and controlled while on a bowel regimen. Her UDS did show alcohol but I am not concerned that she is an everyday drinker. She reports drinking alcohol once every few months. I felt that her breath may have smelled of alcohol today but she was acting appropriately and did not show evidence of being intoxicated. I will continue to monitor this closely. We discussed the side effects and dangers of concomitant alcohol use and opioid use. She understands these risks. Will continue regimen of Norco 7.5-325 mg Q8H PRN #81 tablets per month and MS Contin 30 mg Q12H. I gave her 4 prescriptions of each medication since I do not have availability in my clinic until mid-February and do not want her to run out before then. She will follow up with me in 3 months. Will renew pain contract at that time unless any new complications related to chronic opioid therapy arise.

## 2015-12-03 NOTE — Progress Notes (Signed)
CC: Chronic pain   HPI:  [name] is a 70 y.o. woman with PMHx as noted below who presents today for follow up of her chronic pain.  Chronic Pain: She has a hx of chronic low back pain and pain related to her colostomy. She was hospitalized in early August this year for opioid-induced constipation and thus had her pain medications tapered down. She is currently on Norco 7.5-325 mg Q8H PRN #81 tablets per month and MS Contin 30 mg Q12H. She reports she is doing well on this regimen and has not been running out of pain medication early. She takes 2 tablets most days but will occasionally have to take 3. Her UDS last visit did show some alcohol present. She states she has an alcoholic beverage once very few months but not more than that. We discussed that drinking alcohol more frequently while taking pain medications can be dangerous and she understands this concept. She reports her constipation has been relieved as noted below.   Constipation: Reports she is having 4-5 loose BMs through her colostomy daily. She takes Colace 200 mg in the morning, Miralax 17g mid-day, and Senokot 2 tabs QHS.   Sinus Tachycardia: HR 92 today. She was started on Coreg at her last visit. Denies any dizziness or near syncope. She reports she has good fluid intake, drinks about 7 bottles of water daily.   Depression: PHQ-9 score is 1 today. She is taking Wellbutrin 100 mg 4 times daily. I discussed switching to a longer acting formulation so she would have less pills to take but she states she has tried this in the past and cannot swallow the circular pills it comes in. She would rather stay with her current formulation.   Dyspepsia:  Reports increasing reflux symptoms lately. She describes increased belching and that she is having "small pieces of something that feels like scabs" coming up. She describes a burning sensation that starts in her LLQ and then radiates up her epigastrium into her neck and mouth. She  feels there is fluid coming up. She notes her symptoms are worse at night. She reports she waits at least 4 hours after dinner before going to bed. She is currently taking Nexium 40 mg BID.   Past Medical History:  Diagnosis Date  . Acute sinusitis 06/30/2011  . Anxiety   . Basal cell carcinoma    "left cheek"  . Bleeding ulcer   . Blind in both eyes   . Chronic back pain   . Degenerative disk disease    This is interscapular, mild compression frx of T12 superior endplate with Schmorl's node. Seen on CT in 2/08  . Depression   . Duodenal ulcer 11/08   With hemorrhage and obstruction  . History of blood transfusion    "related to bleeding from intestines and vomiting blood"  . Ischemic colitis ([place]) 07/30/2011   2009 by endoscopy   . Macular degeneration, wet ([place])   . Osteomyelitis, jaw acute 11/08   Started while in the hospital abcess showed GNR . SP debridement by Dr. Lilli Few,  Priscella Mann S Bovis sp 4  weeks of Pen V started on 05/19/07  with additional 2 weeks in 07/04/07  . Perforated bowel [facility])    surgery july 2013  . Superior mesenteric artery syndrome ([place]) 11/08   sp dilation by Dr. Watt Climes, Pt may require bowel resetion if sx recur    Review of Systems:  All negative except per HPI  Physical Exam:  Vitals:   12/03/15 1544  BP: (!) 118/32  Pulse: 92  Temp: 97.9 F (36.6 C)  TempSrc: Oral  SpO2: 97%  Weight: 136 lb 9.6 oz (62 kg)  Height: 5\' 2"  (1.575 m)   Repeat BP 120/52  General: elderly woman sitting up, pleasant, NAD HEENT: [place]/AT, EOMI, sclera anicteric, pharynx non-erythematous, mucus membranes moist CV: RRR, no m/g/r Pulm: CTA bilaterally, breaths non-labored Abd: BS+, soft, non-tender, left-sided colostomy in place Ext: warm, no peripheral edema Neuro: alert and oriented x 3  Assessment & Plan:   See Encounters Tab for problem based charting.  Patient discussed with Dr. Evette Doffing

## 2015-12-03 NOTE — Assessment & Plan Note (Signed)
HR improved this visit. Will continue Coreg 3.125 mg BID.

## 2015-12-04 ENCOUNTER — Other Ambulatory Visit: Payer: Self-pay

## 2015-12-05 NOTE — Progress Notes (Signed)
Internal Medicine Clinic Attending  Case discussed with Dr. Rivet at the time of the visit.  We reviewed the resident's history and exam and pertinent patient test results.  I agree with the assessment, diagnosis, and plan of care documented in the resident's note.  

## 2015-12-12 ENCOUNTER — Encounter: Payer: Self-pay | Admitting: Internal Medicine

## 2016-01-13 ENCOUNTER — Other Ambulatory Visit: Payer: Self-pay

## 2016-01-13 DIAGNOSIS — J329 Chronic sinusitis, unspecified: Secondary | ICD-10-CM

## 2016-01-13 MED ORDER — FLUTICASONE PROPIONATE 50 MCG/ACT NA SUSP: 2.0000 | Freq: Every day | NASAL | 5 refills | Status: DC

## 2016-01-13 MED ORDER — CARVEDILOL 3.125 MG PO TABS: 3.1250 mg | ORAL_TABLET | Freq: Two times a day (BID) | ORAL | 5 refills | Status: DC

## 2016-01-17 ENCOUNTER — Other Ambulatory Visit: Payer: Self-pay | Admitting: *Deleted

## 2016-01-23 MED ORDER — NORTRIPTYLINE HCL 75 MG PO CAPS: 75.0000 mg | ORAL_CAPSULE | Freq: Every day | ORAL | 5 refills | Status: DC

## 2016-02-10 ENCOUNTER — Other Ambulatory Visit: Payer: Self-pay | Admitting: Internal Medicine

## 2016-02-10 DIAGNOSIS — K219 Gastro-esophageal reflux disease without esophagitis: Secondary | ICD-10-CM

## 2016-03-10 ENCOUNTER — Other Ambulatory Visit: Payer: Self-pay | Admitting: *Deleted

## 2016-03-10 MED ORDER — PROMETHAZINE HCL 12.5 MG PO TABS: 12.5000 mg | ORAL_TABLET | Freq: Four times a day (QID) | ORAL | 2 refills | Status: DC | PRN

## 2016-03-10 NOTE — Telephone Encounter (Signed)
Last filled 10/25/2015

## 2016-03-17 ENCOUNTER — Encounter (INDEPENDENT_AMBULATORY_CARE_PROVIDER_SITE_OTHER): Payer: Self-pay

## 2016-03-17 ENCOUNTER — Encounter: Payer: Self-pay | Admitting: Internal Medicine

## 2016-03-17 ENCOUNTER — Ambulatory Visit (INDEPENDENT_AMBULATORY_CARE_PROVIDER_SITE_OTHER): Payer: Medicare Other | Admitting: Internal Medicine

## 2016-03-17 ENCOUNTER — Ambulatory Visit (HOSPITAL_COMMUNITY)
Admission: RE | Admit: 2016-03-17 | Discharge: 2016-03-17 | Disposition: A | Discharge status: Home / Self Care | Payer: Medicare Other | Source: Ambulatory Visit | Attending: Student in an Organized Health Care Education/Training Program | Admitting: Student in an Organized Health Care Education/Training Program

## 2016-03-17 VITALS — BP 129/59 | HR 112 | Temp 97.8°F | Ht 62.0 in | Wt 121.2 lb

## 2016-03-17 DIAGNOSIS — Z933 Colostomy status: Secondary | ICD-10-CM

## 2016-03-17 DIAGNOSIS — M810 Age-related osteoporosis without current pathological fracture: Secondary | ICD-10-CM

## 2016-03-17 DIAGNOSIS — R058 Other specified cough: Secondary | ICD-10-CM

## 2016-03-17 DIAGNOSIS — M545 Low back pain: Secondary | ICD-10-CM | POA: Diagnosis not present

## 2016-03-17 DIAGNOSIS — Z79891 Long term (current) use of opiate analgesic: Secondary | ICD-10-CM

## 2016-03-17 DIAGNOSIS — M791 Myalgia: Secondary | ICD-10-CM | POA: Diagnosis not present

## 2016-03-17 DIAGNOSIS — R05 Cough: Secondary | ICD-10-CM

## 2016-03-17 DIAGNOSIS — Z0000 Encounter for general adult medical examination without abnormal findings: Secondary | ICD-10-CM

## 2016-03-17 DIAGNOSIS — G8929 Other chronic pain: Secondary | ICD-10-CM

## 2016-03-17 DIAGNOSIS — Z87891 Personal history of nicotine dependence: Secondary | ICD-10-CM | POA: Diagnosis not present

## 2016-03-17 DIAGNOSIS — R918 Other nonspecific abnormal finding of lung field: Secondary | ICD-10-CM | POA: Diagnosis not present

## 2016-03-17 DIAGNOSIS — F1129 Opioid dependence with unspecified opioid-induced disorder: Secondary | ICD-10-CM

## 2016-03-17 DIAGNOSIS — M8580 Other specified disorders of bone density and structure, unspecified site: Secondary | ICD-10-CM | POA: Insufficient documentation

## 2016-03-17 DIAGNOSIS — R112 Nausea with vomiting, unspecified: Secondary | ICD-10-CM

## 2016-03-17 DIAGNOSIS — B349 Viral infection, unspecified: Secondary | ICD-10-CM

## 2016-03-17 DIAGNOSIS — R0602 Shortness of breath: Secondary | ICD-10-CM

## 2016-03-17 MED ORDER — HYDROCODONE-ACETAMINOPHEN 7.5-325 MG PO TABS: 1.0000 | ORAL_TABLET | Freq: Three times a day (TID) | ORAL | 0 refills | Status: DC | PRN

## 2016-03-17 MED ORDER — GUAIFENESIN 100 MG/5ML PO SYRP: 200.0000 mg | ORAL_SOLUTION | ORAL | Status: DC | PRN

## 2016-03-17 MED ORDER — MORPHINE SULFATE ER 15 MG PO TBCR: 30.0000 mg | EXTENDED_RELEASE_TABLET | Freq: Two times a day (BID) | ORAL | 0 refills | Status: DC

## 2016-03-17 MED ORDER — METHOCARBAMOL 500 MG PO TABS: 500.0000 mg | ORAL_TABLET | Freq: Four times a day (QID) | ORAL | 2 refills | Status: DC | PRN

## 2016-03-17 NOTE — Progress Notes (Signed)
   CC: Follow up for chronic back pain   HPI:  [name] is a 71 y.o. woman with PMHx as noted below who presents today for follow up of her chronic low back pain.  Chronic Low Back Pain: She reports her pain is well controlled with her current regimen of Norco 7.5-325 mg Q8H PRN and MS Contin 30 mg Q12H. She is taking Norco 2-3 times daily. She denies any issues with constipation. She denies any sleepiness or having to take daytime naps. She also uses Robaxin 500 mg Q6H PRN for muscle spasms. We reviewed the pain contract today and she has agreed to sign a new one.   Osteoporosis: No recent falls. She has been taking Alendronate 70 mg Qweekly and calcium/vitamin D supplementation daily.   Nausea/Vomiting/Cough: Reports a 3 day hx of nausea, vomiting, body aches, chills, and cough productive of thick mucus. She reports "everyone" is sick at her apartment complex with the flu. She has been able to keep food and liquids down despite vomiting. She describes associated fatigue and shortness of breath. She denies any fevers, abdominal pain, constipation, or sore throat.    Past Medical History:  Diagnosis Date  . Acute sinusitis 06/30/2011  . Anxiety   . Basal cell carcinoma    "left cheek"  . Bleeding ulcer   . Blind in both eyes   . Chronic back pain   . Degenerative disk disease    This is interscapular, mild compression frx of T12 superior endplate with Schmorl's node. Seen on CT in 2/08  . Depression   . Duodenal ulcer 11/08   With hemorrhage and obstruction  . History of blood transfusion    "related to bleeding from intestines and vomiting blood"  . Ischemic colitis ([place]) 07/30/2011   2009 by endoscopy   . Macular degeneration, wet ([place])   . Osteomyelitis, jaw acute 11/08   Started while in the hospital abcess showed GNR . SP debridement by Dr. Lilli Few,  Priscella Mann S Bovis sp 4  weeks of Pen V started on 05/19/07  with additional 2 weeks in 07/04/07  . Perforated bowel [facility])    surgery july 2013  . Superior mesenteric artery syndrome ([place]) 11/08   sp dilation by Dr. Watt Climes, Pt may require bowel resetion if sx recur    Review of Systems:   General: Denies night sweats, changes in weight HEENT: Denies headaches, changes in vision CV: Denies CP, palpitations, orthopnea Pulm: Denies wheezing GI: Denies diarrhea, melena, hematochezia GU: Denies dysuria, hematuria, frequency Msk: See HPI Neuro: Denies weakness, numbness, tingling Skin: Denies rashes, bruising Psych: Denies anxiety, hallucinations  Physical Exam:  Vitals:   03/17/16 1604  BP: (!) 129/59  Pulse: (!) 112  Temp: 97.8 F (36.6 C)  TempSrc: Oral  SpO2: 100%  Weight: 121 lb 3.2 oz (55 kg)  Height: 5\' 2"  (1.575 m)   General: Elderly woman sitting up in chair, NAD HEENT: [place]/AT, EOMI, sclera anicteric, pharynx non-erythematous, mucus membranes moist CV: RRR, no m/g/r Pulm:  CTA bilaterally, breaths non-labored  Abd: BS+, soft, non-tender, colostomy in place on left side  Ext: no peripheral edema  Neuro: alert and oriented x 3  Assessment & Plan:   See Encounters Tab for problem based charting.  Patient discussed with Dr. Eppie Gibson

## 2016-03-17 NOTE — Patient Instructions (Addendum)
General Instructions: - We will get blood work and a chest x-ray today - try Robitussin for your cough - Schedule mammogram - If you do not feel any better in the next week then please return to clinic - Follow up in 3 months   Thank you for bringing your medicines today. This helps [place] keep you safe from mistakes.   Progress Toward Treatment Goals:  Treatment Goal 05/11/2012  Stop smoking smoking the same amount    Self Care Goals & Plans:  Self Care Goal 01/15/2014  Manage my medications take my medicines as prescribed; bring my medications to every visit; refill my medications on time  Eat healthy foods eat more vegetables; eat foods that are low in salt; eat baked foods instead of fried foods  Be physically active find an activity I enjoy  Stop smoking -    No flowsheet data found.   Care Management & Community Referrals:  Referral 05/31/2013  Referrals made for care management support -  Referrals made to community resources none

## 2016-03-18 ENCOUNTER — Telehealth: Payer: Self-pay | Admitting: Internal Medicine

## 2016-03-18 LAB — CBC WITH DIFFERENTIAL/PLATELET
Basophils Absolute: 0 10*3/uL (ref 0.0–0.2)
Basos: 0 %
EOS (ABSOLUTE): 0.1 10*3/uL (ref 0.0–0.4)
EOS: 1 %
HEMATOCRIT: 47.9 % — AB (ref 34.0–46.6)
HEMOGLOBIN: 16.4 g/dL — AB (ref 11.1–15.9)
Immature Grans (Abs): 0 10*3/uL (ref 0.0–0.1)
Immature Granulocytes: 0 %
Lymphocytes Absolute: 3 10*3/uL (ref 0.7–3.1)
Lymphs: 30 %
MCH: 32.4 pg (ref 26.6–33.0)
MCHC: 34.2 g/dL (ref 31.5–35.7)
MCV: 95 fL (ref 79–97)
MONOCYTES: 7 %
MONOS ABS: 0.7 10*3/uL (ref 0.1–0.9)
NEUTROS ABS: 6.4 10*3/uL (ref 1.4–7.0)
Neutrophils: 62 %
Platelets: 326 10*3/uL (ref 150–379)
RBC: 5.06 x10E6/uL (ref 3.77–5.28)
RDW: 13.2 % (ref 12.3–15.4)
WBC: 10.2 10*3/uL (ref 3.4–10.8)

## 2016-03-18 LAB — BMP8+ANION GAP
Anion Gap: 20 — ABNORMAL HIGH (ref 10.0–18.0)
BUN / CREAT RATIO: 12 (ref 12–28)
BUN: 7 mg/dL — ABNORMAL LOW (ref 8–27)
CO2: 26 mmol/L (ref 18–29)
CREATININE: 0.58 mg/dL (ref 0.57–1.00)
Calcium: 9.6 mg/dL (ref 8.7–10.3)
Chloride: 96 mmol/L (ref 96–106)
GFR calc non Af Amer: 92 (ref >59)
GFR, EST AFRICAN AMERICAN: 106 (ref >59)
Glucose: 122 mg/dL — ABNORMAL HIGH (ref 65–99)
Potassium: 3.9 mmol/L (ref 3.5–5.2)
SODIUM: 142 mmol/L (ref 134–144)

## 2016-03-18 NOTE — Telephone Encounter (Signed)
Left message for patient that test results are fine. Advised to call back if any specific questions.

## 2016-03-19 DIAGNOSIS — B349 Viral infection, unspecified: Secondary | ICD-10-CM | POA: Insufficient documentation

## 2016-03-19 NOTE — Assessment & Plan Note (Signed)
[name] Bell is on chronic opioid therapy for chronic pain. The date of the controlled substances contract is referenced in the [employer] and / or the overview. Date of pain contract was 03/17/16. As part of the treatment plan, the [place] controlled substance database is checked at least twice yearly and the database results are appropriate. I have last reviewed the results on 03/17/16.   The last UDS was on 09/24/15 and results are as expected. She did have EtOH present on her UDS but she rarely drinks EtOH. We discussed the risks of combining alcohol and opioids. Patient needs at least a yearly UDS.   The patient is on hydrocodone/acetaminophen (Lorcet, Lortab, Norco, Vicodin) and morphine. Norco 7.5-325 mg Q8H PRN, 81 per 30 days. MS Contin 30 mg Q12H, 120 tablets per 30 days. Adjunctive treatment includes Nortriptyline and Muscle relaxants. This regimen allows [name] Bell to function and does not cause excessive sedation or other side effects.  "The benefits of continuing opioid therapy outweigh the risks and chronic opioids will be continued. Ongoing education about safe opioid treatment is provided  Interventions today include: Refills - 3 paper Rx printed of both MS Contin and Norco Renewed pain contract today She will follow up with me in 3 months

## 2016-03-19 NOTE — Assessment & Plan Note (Signed)
Patient will call breast center to schedule mammo

## 2016-03-19 NOTE — Assessment & Plan Note (Signed)
She presents with a 3 day hx of nausea/vomiting and productive cough with associated SOB, body aches, and chills. She is out of the window for Tamiflu if this is the flu. Will check a CXR to make sure no pneumonia>> CXR with chronic bronchitic changes, no PNA. Check CBC and bmet given nausea/vomiting>> WBC 10, hemoglobin mildly elevated which could be due to volume depletion, but BUN and Cr are normal. She is tolerating PO intake. Recommended Robitussin for her cough. Advised her to return to clinic if symptoms worsen or do not improve in the next week.

## 2016-03-19 NOTE — Assessment & Plan Note (Signed)
Tolerating Alendronate well. Continue Alendronate 70 mg Qweekly and calcium/vitamin D supplementation.

## 2016-03-19 NOTE — Progress Notes (Signed)
Case discussed with Dr. Rivet soon after the resident saw the patient. We reviewed the resident's history and exam and pertinent patient test results. I agree with the assessment, diagnosis, and plan of care documented in the resident's note. 

## 2016-03-27 ENCOUNTER — Other Ambulatory Visit: Payer: Self-pay | Admitting: *Deleted

## 2016-03-27 MED ORDER — ALBUTEROL SULFATE HFA 108 (90 BASE) MCG/ACT IN AERS: 2.0000 | INHALATION_SPRAY | Freq: Four times a day (QID) | RESPIRATORY_TRACT | 3 refills | Status: DC | PRN

## 2016-03-31 ENCOUNTER — Inpatient Hospital Stay (HOSPITAL_COMMUNITY)
Admission: EM | Admit: 2016-03-31 | Discharge: 2016-04-07 | DRG: 392 | Disposition: A | Discharge status: Home Health | Payer: Medicare Other | Attending: Internal Medicine | Admitting: Internal Medicine

## 2016-03-31 ENCOUNTER — Emergency Department (HOSPITAL_COMMUNITY): Payer: Medicare Other

## 2016-03-31 ENCOUNTER — Encounter (HOSPITAL_COMMUNITY): Payer: Self-pay | Admitting: *Deleted

## 2016-03-31 DIAGNOSIS — K5903 Drug induced constipation: Secondary | ICD-10-CM | POA: Diagnosis present

## 2016-03-31 DIAGNOSIS — Z90710 Acquired absence of both cervix and uterus: Secondary | ICD-10-CM

## 2016-03-31 DIAGNOSIS — Z8249 Family history of ischemic heart disease and other diseases of the circulatory system: Secondary | ICD-10-CM

## 2016-03-31 DIAGNOSIS — G8929 Other chronic pain: Secondary | ICD-10-CM | POA: Diagnosis not present

## 2016-03-31 DIAGNOSIS — Z8711 Personal history of peptic ulcer disease: Secondary | ICD-10-CM | POA: Diagnosis not present

## 2016-03-31 DIAGNOSIS — E873 Alkalosis: Secondary | ICD-10-CM | POA: Diagnosis not present

## 2016-03-31 DIAGNOSIS — I959 Hypotension, unspecified: Secondary | ICD-10-CM | POA: Diagnosis not present

## 2016-03-31 DIAGNOSIS — E860 Dehydration: Secondary | ICD-10-CM | POA: Diagnosis present

## 2016-03-31 DIAGNOSIS — K2950 Unspecified chronic gastritis without bleeding: Secondary | ICD-10-CM | POA: Diagnosis not present

## 2016-03-31 DIAGNOSIS — H353290 Exudative age-related macular degeneration, unspecified eye, stage unspecified: Secondary | ICD-10-CM | POA: Diagnosis present

## 2016-03-31 DIAGNOSIS — Z85828 Personal history of other malignant neoplasm of skin: Secondary | ICD-10-CM

## 2016-03-31 DIAGNOSIS — Z681 Body mass index (BMI) 19 or less, adult: Secondary | ICD-10-CM

## 2016-03-31 DIAGNOSIS — M810 Age-related osteoporosis without current pathological fracture: Secondary | ICD-10-CM | POA: Diagnosis present

## 2016-03-31 DIAGNOSIS — Z87891 Personal history of nicotine dependence: Secondary | ICD-10-CM

## 2016-03-31 DIAGNOSIS — G894 Chronic pain syndrome: Secondary | ICD-10-CM | POA: Diagnosis present

## 2016-03-31 DIAGNOSIS — J449 Chronic obstructive pulmonary disease, unspecified: Secondary | ICD-10-CM | POA: Diagnosis present

## 2016-03-31 DIAGNOSIS — R059 Cough, unspecified: Secondary | ICD-10-CM

## 2016-03-31 DIAGNOSIS — K311 Adult hypertrophic pyloric stenosis: Secondary | ICD-10-CM | POA: Diagnosis not present

## 2016-03-31 DIAGNOSIS — E876 Hypokalemia: Secondary | ICD-10-CM | POA: Diagnosis present

## 2016-03-31 DIAGNOSIS — K222 Esophageal obstruction: Secondary | ICD-10-CM | POA: Diagnosis present

## 2016-03-31 DIAGNOSIS — K224 Dyskinesia of esophagus: Secondary | ICD-10-CM | POA: Diagnosis not present

## 2016-03-31 DIAGNOSIS — R197 Diarrhea, unspecified: Secondary | ICD-10-CM | POA: Diagnosis not present

## 2016-03-31 DIAGNOSIS — E440 Moderate protein-calorie malnutrition: Secondary | ICD-10-CM | POA: Diagnosis not present

## 2016-03-31 DIAGNOSIS — R404 Transient alteration of awareness: Secondary | ICD-10-CM | POA: Diagnosis not present

## 2016-03-31 DIAGNOSIS — H548 Legal blindness, as defined in USA: Secondary | ICD-10-CM | POA: Diagnosis not present

## 2016-03-31 DIAGNOSIS — Z809 Family history of malignant neoplasm, unspecified: Secondary | ICD-10-CM

## 2016-03-31 DIAGNOSIS — R05 Cough: Secondary | ICD-10-CM | POA: Diagnosis not present

## 2016-03-31 DIAGNOSIS — F329 Major depressive disorder, single episode, unspecified: Secondary | ICD-10-CM | POA: Diagnosis present

## 2016-03-31 DIAGNOSIS — Z9841 Cataract extraction status, right eye: Secondary | ICD-10-CM

## 2016-03-31 DIAGNOSIS — J42 Unspecified chronic bronchitis: Secondary | ICD-10-CM | POA: Diagnosis present

## 2016-03-31 DIAGNOSIS — R1084 Generalized abdominal pain: Secondary | ICD-10-CM | POA: Diagnosis not present

## 2016-03-31 DIAGNOSIS — F419 Anxiety disorder, unspecified: Secondary | ICD-10-CM | POA: Diagnosis present

## 2016-03-31 DIAGNOSIS — I472 Ventricular tachycardia: Secondary | ICD-10-CM | POA: Diagnosis not present

## 2016-03-31 DIAGNOSIS — R109 Unspecified abdominal pain: Secondary | ICD-10-CM | POA: Diagnosis not present

## 2016-03-31 DIAGNOSIS — Z91040 Latex allergy status: Secondary | ICD-10-CM

## 2016-03-31 DIAGNOSIS — K435 Parastomal hernia without obstruction or  gangrene: Secondary | ICD-10-CM | POA: Diagnosis present

## 2016-03-31 DIAGNOSIS — F1120 Opioid dependence, uncomplicated: Secondary | ICD-10-CM | POA: Diagnosis present

## 2016-03-31 DIAGNOSIS — R933 Abnormal findings on diagnostic imaging of other parts of digestive tract: Secondary | ICD-10-CM | POA: Diagnosis not present

## 2016-03-31 DIAGNOSIS — Z9049 Acquired absence of other specified parts of digestive tract: Secondary | ICD-10-CM

## 2016-03-31 DIAGNOSIS — Z7951 Long term (current) use of inhaled steroids: Secondary | ICD-10-CM

## 2016-03-31 DIAGNOSIS — K315 Obstruction of duodenum: Secondary | ICD-10-CM | POA: Diagnosis not present

## 2016-03-31 DIAGNOSIS — R1110 Vomiting, unspecified: Secondary | ICD-10-CM | POA: Diagnosis not present

## 2016-03-31 DIAGNOSIS — K56609 Unspecified intestinal obstruction, unspecified as to partial versus complete obstruction: Secondary | ICD-10-CM | POA: Diagnosis not present

## 2016-03-31 DIAGNOSIS — K5900 Constipation, unspecified: Secondary | ICD-10-CM | POA: Diagnosis not present

## 2016-03-31 DIAGNOSIS — K5649 Other impaction of intestine: Secondary | ICD-10-CM | POA: Diagnosis not present

## 2016-03-31 DIAGNOSIS — Z886 Allergy status to analgesic agent status: Secondary | ICD-10-CM

## 2016-03-31 DIAGNOSIS — Z961 Presence of intraocular lens: Secondary | ICD-10-CM | POA: Diagnosis present

## 2016-03-31 DIAGNOSIS — K2970 Gastritis, unspecified, without bleeding: Secondary | ICD-10-CM | POA: Diagnosis present

## 2016-03-31 DIAGNOSIS — Z79899 Other long term (current) drug therapy: Secondary | ICD-10-CM

## 2016-03-31 DIAGNOSIS — H3530 Unspecified macular degeneration: Secondary | ICD-10-CM

## 2016-03-31 DIAGNOSIS — Z933 Colostomy status: Secondary | ICD-10-CM | POA: Diagnosis not present

## 2016-03-31 DIAGNOSIS — M549 Dorsalgia, unspecified: Secondary | ICD-10-CM | POA: Diagnosis present

## 2016-03-31 DIAGNOSIS — R112 Nausea with vomiting, unspecified: Secondary | ICD-10-CM | POA: Diagnosis not present

## 2016-03-31 DIAGNOSIS — R531 Weakness: Secondary | ICD-10-CM | POA: Diagnosis not present

## 2016-03-31 DIAGNOSIS — E785 Hyperlipidemia, unspecified: Secondary | ICD-10-CM | POA: Diagnosis present

## 2016-03-31 DIAGNOSIS — Z833 Family history of diabetes mellitus: Secondary | ICD-10-CM

## 2016-03-31 DIAGNOSIS — R9431 Abnormal electrocardiogram [ECG] [EKG]: Secondary | ICD-10-CM | POA: Diagnosis not present

## 2016-03-31 DIAGNOSIS — K529 Noninfective gastroenteritis and colitis, unspecified: Secondary | ICD-10-CM | POA: Diagnosis not present

## 2016-03-31 DIAGNOSIS — Z9842 Cataract extraction status, left eye: Secondary | ICD-10-CM

## 2016-03-31 DIAGNOSIS — T40605A Adverse effect of unspecified narcotics, initial encounter: Secondary | ICD-10-CM | POA: Diagnosis present

## 2016-03-31 LAB — COMPREHENSIVE METABOLIC PANEL
ALBUMIN: 2.3 g/dL — AB (ref 3.5–5.0)
ALK PHOS: 114 U/L (ref 38–126)
ALT: 21 U/L (ref 14–54)
ANION GAP: 10 (ref 5–15)
AST: 51 U/L — AB (ref 15–41)
BUN: 10 mg/dL (ref 6–20)
CO2: 34 mmol/L — ABNORMAL HIGH (ref 22–32)
Calcium: 8.7 mg/dL — ABNORMAL LOW (ref 8.9–10.3)
Chloride: 91 mmol/L — ABNORMAL LOW (ref 101–111)
Creatinine, Ser: 0.58 mg/dL (ref 0.44–1.00)
GFR calc Af Amer: >60 mL/min (ref >60)
GFR calc non Af Amer: >60 mL/min (ref >60)
GLUCOSE: 109 mg/dL — AB (ref 65–99)
POTASSIUM: 2.9 mmol/L — AB (ref 3.5–5.1)
SODIUM: 135 mmol/L (ref 135–145)
Total Bilirubin: 0.9 mg/dL (ref 0.3–1.2)
Total Protein: 5.2 g/dL — ABNORMAL LOW (ref 6.5–8.1)

## 2016-03-31 LAB — CBC WITH DIFFERENTIAL/PLATELET
BASOS ABS: 0 10*3/uL (ref 0.0–0.1)
Basophils Relative: 0 %
EOS ABS: 0 10*3/uL (ref 0.0–0.7)
EOS PCT: 0 %
HCT: 44.9 % (ref 36.0–46.0)
Hemoglobin: 15.5 g/dL — ABNORMAL HIGH (ref 12.0–15.0)
LYMPHS ABS: 2.8 10*3/uL (ref 0.7–4.0)
Lymphocytes Relative: 33 %
MCH: 31.2 pg (ref 26.0–34.0)
MCHC: 34.5 g/dL (ref 30.0–36.0)
MCV: 90.3 fL (ref 78.0–100.0)
Monocytes Absolute: 0.7 10*3/uL (ref 0.1–1.0)
Monocytes Relative: 9 %
Neutro Abs: 4.9 10*3/uL (ref 1.7–7.7)
Neutrophils Relative %: 58 %
PLATELETS: 236 10*3/uL (ref 150–400)
RBC: 4.97 MIL/uL (ref 3.87–5.11)
RDW: 13.9 % (ref 11.5–15.5)
WBC: 8.4 10*3/uL (ref 4.0–10.5)

## 2016-03-31 LAB — MAGNESIUM: Magnesium: 1.8 mg/dL (ref 1.7–2.4)

## 2016-03-31 LAB — URINALYSIS, ROUTINE W REFLEX MICROSCOPIC
BILIRUBIN URINE: NEGATIVE
Glucose, UA: NEGATIVE mg/dL
KETONES UR: NEGATIVE mg/dL
Nitrite: NEGATIVE
PROTEIN: NEGATIVE mg/dL
Specific Gravity, Urine: 1.008 (ref 1.005–1.030)
pH: 7 (ref 5.0–8.0)

## 2016-03-31 LAB — LIPASE, BLOOD: LIPASE: 17 U/L (ref 11–51)

## 2016-03-31 MED ORDER — ONDANSETRON HCL 4 MG/2ML IJ SOLN
4.0000 mg | Freq: Once | INTRAMUSCULAR | Status: AC
Start: 2016-03-31 — End: 2016-03-31
  Administered 2016-03-31: 4 mg via INTRAVENOUS
  Filled 2016-03-31: qty 2

## 2016-03-31 MED ORDER — ONDANSETRON HCL 4 MG PO TABS: 4.0000 mg | ORAL_TABLET | Freq: Four times a day (QID) | ORAL | Status: DC | PRN

## 2016-03-31 MED ORDER — ACETAMINOPHEN 325 MG PO TABS: 650.0000 mg | ORAL_TABLET | Freq: Four times a day (QID) | ORAL | Status: DC | PRN

## 2016-03-31 MED ORDER — SODIUM CHLORIDE 0.9 % IV SOLN
30.0000 meq | Freq: Once | INTRAVENOUS | Status: AC
  Administered 2016-03-31: 30 meq via INTRAVENOUS
  Filled 2016-03-31: qty 15

## 2016-03-31 MED ORDER — CIPROFLOXACIN IN D5W 400 MG/200ML IV SOLN
400.0000 mg | INTRAVENOUS | Status: AC
  Administered 2016-04-01: 400 mg via INTRAVENOUS
  Filled 2016-03-31: qty 200

## 2016-03-31 MED ORDER — SODIUM CHLORIDE 0.9% FLUSH
3.0000 mL | Freq: Two times a day (BID) | INTRAVENOUS | Status: DC
  Administered 2016-04-02 – 2016-04-06 (×5): 3 mL via INTRAVENOUS

## 2016-03-31 MED ORDER — PANTOPRAZOLE SODIUM 40 MG IV SOLR
40.0000 mg | Freq: Two times a day (BID) | INTRAVENOUS | Status: DC
Start: 2016-03-31 — End: 2016-04-07
  Administered 2016-03-31 – 2016-04-07 (×14): 40 mg via INTRAVENOUS
  Filled 2016-03-31 (×15): qty 40

## 2016-03-31 MED ORDER — IOPAMIDOL (ISOVUE-300) INJECTION 61%
INTRAVENOUS | Status: AC
  Filled 2016-03-31: qty 100

## 2016-03-31 MED ORDER — SODIUM CHLORIDE 0.9 % IV SOLN
Freq: Once | INTRAVENOUS | Status: AC
  Administered 2016-03-31: 21:00:00 via INTRAVENOUS

## 2016-03-31 MED ORDER — SODIUM CHLORIDE 0.9 % IV SOLN
INTRAVENOUS | Status: DC
  Administered 2016-03-31 – 2016-04-02 (×4): via INTRAVENOUS

## 2016-03-31 MED ORDER — METRONIDAZOLE IN NACL 5-0.79 MG/ML-% IV SOLN
500.0000 mg | Freq: Three times a day (TID) | INTRAVENOUS | Status: DC
  Administered 2016-04-01 (×2): 500 mg via INTRAVENOUS
  Filled 2016-03-31 (×2): qty 100

## 2016-03-31 MED ORDER — CIPROFLOXACIN IN D5W 400 MG/200ML IV SOLN: 400.0000 mg | Freq: Two times a day (BID) | INTRAVENOUS | Status: DC

## 2016-03-31 MED ORDER — POTASSIUM CHLORIDE 2 MEQ/ML IV SOLN
Freq: Once | INTRAVENOUS | Status: AC
  Administered 2016-03-31: via INTRAVENOUS
  Filled 2016-03-31: qty 1000

## 2016-03-31 MED ORDER — MAGNESIUM SULFATE 2 GM/50ML IV SOLN
2.0000 g | Freq: Once | INTRAVENOUS | Status: AC
  Administered 2016-03-31: 2 g via INTRAVENOUS
  Filled 2016-03-31: qty 50

## 2016-03-31 MED ORDER — FENTANYL CITRATE (PF) 100 MCG/2ML IJ SOLN
50.0000 ug | Freq: Once | INTRAMUSCULAR | Status: AC
  Administered 2016-03-31: 50 ug via INTRAVENOUS
  Filled 2016-03-31: qty 2

## 2016-03-31 MED ORDER — ONDANSETRON HCL 4 MG/2ML IJ SOLN
4.0000 mg | Freq: Four times a day (QID) | INTRAMUSCULAR | Status: DC | PRN
  Administered 2016-03-31 – 2016-04-03 (×5): 4 mg via INTRAVENOUS
  Filled 2016-03-31 (×5): qty 2

## 2016-03-31 MED ORDER — HYDROMORPHONE HCL 1 MG/ML IJ SOLN
0.5000 mg | INTRAMUSCULAR | Status: DC | PRN
  Administered 2016-04-01 – 2016-04-07 (×22): 0.5 mg via INTRAVENOUS
  Filled 2016-03-31 (×24): qty 0.5

## 2016-03-31 MED ORDER — SODIUM CHLORIDE 0.9 % IV BOLUS (SEPSIS)
1000.0000 mL | Freq: Once | INTRAVENOUS | Status: AC
  Administered 2016-03-31: 1000 mL via INTRAVENOUS

## 2016-03-31 MED ORDER — ONDANSETRON HCL 4 MG/2ML IJ SOLN
4.0000 mg | Freq: Once | INTRAMUSCULAR | Status: AC
  Administered 2016-03-31: 4 mg via INTRAVENOUS
  Filled 2016-03-31: qty 2

## 2016-03-31 MED ORDER — IOPAMIDOL (ISOVUE-300) INJECTION 61%
100.0000 mL | Freq: Once | INTRAVENOUS | Status: AC | PRN
  Administered 2016-03-31: 75 mL via INTRAVENOUS

## 2016-03-31 MED ORDER — ALBUTEROL SULFATE (2.5 MG/3ML) 0.083% IN NEBU: 2.5000 mg | INHALATION_SOLUTION | Freq: Four times a day (QID) | RESPIRATORY_TRACT | Status: DC | PRN

## 2016-03-31 MED ORDER — MORPHINE SULFATE ER 30 MG PO TBCR
30.0000 mg | EXTENDED_RELEASE_TABLET | Freq: Two times a day (BID) | ORAL | Status: DC
  Administered 2016-03-31 – 2016-04-07 (×13): 30 mg via ORAL
  Filled 2016-03-31 (×15): qty 1

## 2016-03-31 MED ORDER — ACETAMINOPHEN 650 MG RE SUPP: 650.0000 mg | Freq: Four times a day (QID) | RECTAL | Status: DC | PRN

## 2016-03-31 NOTE — ED Notes (Signed)
Patient made aware of needed urine sample. 
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
## 2016-03-31 NOTE — Progress Notes (Signed)
Pharmacy Antibiotic Note  [name] Bell is a 71 y.o. female admitted on 03/31/2016 with intra-abdominal infection.  Pharmacy has been consulted for Cipro dosing.  Plan: Cipro 400mg  IV q12h Flagyl per MD Follow renal function F/U urine culture  Weight: 121 lb 4.1 oz (55 kg)  Temp (24hrs), Avg:97.8 F (36.6 C), Min:97.8 F (36.6 C), Max:97.8 F (36.6 C)   Recent Labs Lab 03/31/16 1618  WBC 8.4  CREATININE 0.58    Estimated Creatinine Clearance: 50.3 mL/min (by C-G formula based on SCr of 0.58 mg/dL).    Allergies  Allergen Reactions  . Ibuprofen Other (See Comments)    REACTION: Bleeding ulcers  . Nsaids Other (See Comments)    REACTION: Bleeding Ulcer  . Latex Itching    Antimicrobials this admission: 3/6 cipro >>   3/6 flagyl >>    Dose adjustments this admission:    Microbiology results: 3/6 UCx: sent   Thank you for allowing pharmacy to be a part of this patient's care.  Everette Rank, PharmD 03/31/2016 9:16 PM

## 2016-03-31 NOTE — ED Notes (Signed)
Hospitalist at bedside 

## 2016-03-31 NOTE — ED Triage Notes (Signed)
Per EMS, pt complains of nausea, vomiting, diarrhea x 3 weeks. Pt denies fever. Pt denies abdominal pain. Pt reports decreased oral intake. Pt has colostomy d/t diverticulitis.   BP 130/68 HR 92 CBG 134 96% on RA
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
## 2016-04-01 LAB — BASIC METABOLIC PANEL
Anion gap: 7 (ref 5–15)
BUN: 7 mg/dL (ref 6–20)
CALCIUM: 7.5 mg/dL — AB (ref 8.9–10.3)
CO2: 29 mmol/L (ref 22–32)
CREATININE: 0.62 mg/dL (ref 0.44–1.00)
Chloride: 103 mmol/L (ref 101–111)
GFR calc Af Amer: >60 mL/min (ref >60)
GFR calc non Af Amer: >60 mL/min (ref >60)
GLUCOSE: 90 mg/dL (ref 65–99)
Potassium: 3.7 mmol/L (ref 3.5–5.1)
SODIUM: 139 mmol/L (ref 135–145)

## 2016-04-01 LAB — CBC
HCT: 47 % — ABNORMAL HIGH (ref 36.0–46.0)
Hemoglobin: 16.7 g/dL — ABNORMAL HIGH (ref 12.0–15.0)
MCH: 32.6 pg (ref 26.0–34.0)
MCHC: 35.5 g/dL (ref 30.0–36.0)
MCV: 91.6 fL (ref 78.0–100.0)
PLATELETS: 206 10*3/uL (ref 150–400)
RBC: 5.13 MIL/uL — AB (ref 3.87–5.11)
RDW: 14.3 % (ref 11.5–15.5)
WBC: 9.8 10*3/uL (ref 4.0–10.5)

## 2016-04-01 LAB — MRSA CULTURE: Culture: NOT DETECTED

## 2016-04-01 LAB — TSH: TSH: 2.388 u[IU]/mL (ref 0.350–4.500)

## 2016-04-01 LAB — MAGNESIUM: Magnesium: 2 mg/dL (ref 1.7–2.4)

## 2016-04-01 LAB — MRSA PCR SCREENING: MRSA BY PCR: INVALID — AB

## 2016-04-01 MED ORDER — METOPROLOL TARTRATE 5 MG/5ML IV SOLN: 2.5000 mg | INTRAVENOUS | Status: DC | PRN

## 2016-04-01 MED ORDER — AMPICILLIN-SULBACTAM SODIUM 1.5 (1-0.5) G IJ SOLR
1.5000 g | Freq: Four times a day (QID) | INTRAMUSCULAR | Status: DC
  Administered 2016-04-02 – 2016-04-07 (×22): 1.5 g via INTRAVENOUS
  Filled 2016-04-01 (×25): qty 1.5

## 2016-04-01 MED ORDER — SODIUM CHLORIDE 0.9 % IV SOLN
30.0000 meq | Freq: Once | INTRAVENOUS | Status: AC
  Administered 2016-04-01: 30 meq via INTRAVENOUS
  Filled 2016-04-01: qty 15

## 2016-04-01 MED ORDER — GUAIFENESIN ER 600 MG PO TB12
600.0000 mg | ORAL_TABLET | Freq: Two times a day (BID) | ORAL | Status: DC
  Administered 2016-04-01 – 2016-04-07 (×12): 600 mg via ORAL
  Filled 2016-04-01 (×13): qty 1

## 2016-04-01 MED ORDER — PROMETHAZINE HCL 25 MG/ML IJ SOLN
12.5000 mg | Freq: Four times a day (QID) | INTRAMUSCULAR | Status: DC | PRN
  Administered 2016-04-01: 12.5 mg via INTRAVENOUS
  Filled 2016-04-01: qty 1

## 2016-04-01 MED ORDER — CHLORHEXIDINE GLUCONATE 0.12 % MT SOLN
15.0000 mL | Freq: Two times a day (BID) | OROMUCOSAL | Status: DC
  Administered 2016-04-01 – 2016-04-07 (×11): 15 mL via OROMUCOSAL
  Filled 2016-04-01 (×11): qty 15

## 2016-04-01 MED ORDER — SODIUM CHLORIDE 0.9 % IV SOLN
1.5000 g | Freq: Four times a day (QID) | INTRAVENOUS | Status: DC
  Administered 2016-04-01 (×2): 1.5 g via INTRAVENOUS
  Filled 2016-04-01 (×4): qty 1.5

## 2016-04-01 MED ORDER — ORAL CARE MOUTH RINSE
15.0000 mL | Freq: Two times a day (BID) | OROMUCOSAL | Status: DC
  Administered 2016-04-02 – 2016-04-06 (×9): 15 mL via OROMUCOSAL

## 2016-04-01 MED ORDER — PRO-STAT SUGAR FREE PO LIQD
30.0000 mL | Freq: Two times a day (BID) | ORAL | Status: DC
  Administered 2016-04-02 – 2016-04-06 (×8): 30 mL via ORAL
  Filled 2016-04-01 (×9): qty 30

## 2016-04-01 MED ORDER — CARVEDILOL 3.125 MG PO TABS: 3.1250 mg | ORAL_TABLET | Freq: Two times a day (BID) | ORAL | Status: DC

## 2016-04-01 MED ORDER — POTASSIUM CHLORIDE CRYS ER 20 MEQ PO TBCR
40.0000 meq | EXTENDED_RELEASE_TABLET | Freq: Once | ORAL | Status: AC
  Administered 2016-04-01: 20 meq via ORAL
  Filled 2016-04-01: qty 2

## 2016-04-01 MED ORDER — SODIUM CHLORIDE 0.9 % IV BOLUS (SEPSIS)
1000.0000 mL | Freq: Once | INTRAVENOUS | Status: AC
Start: 2016-04-01 — End: 2016-04-01
  Administered 2016-04-01: 1000 mL via INTRAVENOUS

## 2016-04-01 MED ORDER — BOOST / RESOURCE BREEZE PO LIQD
1.0000 | Freq: Two times a day (BID) | ORAL | Status: DC
  Administered 2016-04-01 – 2016-04-07 (×11): 1 via ORAL

## 2016-04-01 NOTE — Progress Notes (Signed)
Pt attempted to take PO KCL. Took half of 1st 72meq tablet and was able to keep down. Attempted to take other half of tablet and began vomiting up pills and soup she had eaten. Refused to take 2nd pill and states she can't try any more KCL today.

## 2016-04-01 NOTE — Progress Notes (Signed)
Initial Nutrition Assessment  DOCUMENTATION CODES:   Non-severe (moderate) malnutrition in context of chronic illness  INTERVENTION:  Boost Breeze po BID, each supplement provides 250 kcal and 9 grams of protein  Prostat liquid protein PO 30 ml BID with meals, each supplement provides 100 kcal, 15 grams protein.  NUTRITION DIAGNOSIS:   Malnutrition related to chronic illness as evidenced by moderate depletions of muscle mass, 16 percent weight loss in 4 months.  GOAL:   Patient will meet greater than or equal to 90% of their needs  MONITOR:   PO intake, Supplement acceptance, Labs, Weight trends  REASON FOR ASSESSMENT:   Malnutrition Screening Tool    ASSESSMENT:    71 y.o. woman with a history of blindness secondary to macular degeneration, bleeding duodenal ulcer, ischemic colitis, S/P sigmoid colectomy with LLQ colostomy who presents to the ED for evaluation of three weeks of intermittent nausea, vomiting, and increased ostomy output.  Patient has intermittently tolerated some PO intake while using phenergan for nausea.  She was prompted to present to the ED for increased emesis in the past 1-2 days. Admitted for colitis and SBO  Pt is blind. Met with pt in room today. Pt reports good appetite currently and pta but reports that for the past 1 1/2 weeks she has been unable to keep anything down. Pt reports whenever she eats a bite of food, she immediately regurgitates it back up. Per chart, pt has lost 21lbs(16%) in four months. This is severe. Pt is lactose intolerant. Pt reports that she used to drink Boost Breeze 3-4 per day, but then she began developing severe gas afterwards. Pt would like to try Boost Breeze again. Advised pt to start with 1/4 of a serving and advance slowly. Pt would like to have prostat if unable to tolerate Boost Breeze. Pt with diarrhea today. Reports good amounts in her ostomy bag. RD discussed with pt the importance of adequate protein intake and  adequate water intake s/p colostomy. Pt advanced to full liquid diet today.   Medications reviewed and include: ciprofloxacin, metronidazole, morphine, protonix, NaCl, hydromorphone   Labs reviewed: Ca 7.5(L) adj. 8.86(L), alb 2.3(L)  Nutrition-Focused physical exam completed. Findings are moderate fat depletion, moderate muscle depletion, and no edema.   Diet Order:  Diet full liquid Room service appropriate? Yes; Fluid consistency: Thin  Skin:  Reviewed, no issues  Last BM:  3/7-colostomy   Height:   Ht Readings from Last 1 Encounters:  03/31/16 '5\' 4"'$  (1.626 m)    Weight:   Wt Readings from Last 1 Encounters:  03/31/16 115 lb 14.4 oz (52.6 kg)    Ideal Body Weight:  54.5 kg  BMI:  Body mass index is 19.89 kg/m.  Estimated Nutritional Needs:   Kcal:  1500-1800kcal/day   Protein:  68-78g/day   Fluid:  >1.5L/day   EDUCATION NEEDS:   No education needs identified at this time  Koleen Distance, RD, LDN Pager #[phone]

## 2016-04-01 NOTE — Progress Notes (Signed)
Patient ID: [name] Bell, female   DOB: November 06, 1945, 71 y.o.   MRN: [id number]  [facility] Progress Note     Subjective: Feeling a little better this morning. Tolerating clear liquids (she has had jello, broth, and water). Denies subsequent n/v. Liquid stool in colostomy bag.  Objective: Vital signs in last 24 hours: Temp:  [97.8 F (36.6 C)-98.3 F (36.8 C)] 98.3 F (36.8 C) (03/06 2242) Pulse Rate:  [89-99] 91 (03/06 2242) Resp:  [16-19] 16 (03/06 2242) BP: (92-121)/(41-61) 114/50 (03/06 2242) SpO2:  [92 %-98 %] 98 % (03/06 2242) Weight:  [115 lb 14.4 oz (52.6 kg)-121 lb 4.1 oz (55 kg)] 115 lb 14.4 oz (52.6 kg) (03/06 2242) Last BM Date: 04/01/16  Intake/Output from previous day: 03/06 0701 - 03/07 0700 In: 2170.8 [I.V.:870.8; IV Piggyback:1300] Out: -  Intake/Output this shift: Total I/O In: 750.4 [P.O.:240; I.V.:410.4; IV Piggyback:100] Out: -   PE: Gen:  Alert, NAD, pleasant Eyes: blind Card:  RRR, no M/G/R heard Pulm:  CTAB, no W/R/R, effort normal Abd: Soft, NT/ND, +BS, LLQ colostomy with liquid stool in bag, parastomal hernia soft Ext:  No erythema, edema, or tenderness   Lab Results:   Recent Labs  03/31/16 1618 04/01/16 0653  WBC 8.4 9.8  HGB 15.5* 16.7*  HCT 44.9 47.0*  PLT 236 206   BMET  Recent Labs  03/31/16 1618 04/01/16 0653  NA 135 139  K 2.9* 3.7  CL 91* 103  CO2 34* 29  GLUCOSE 109* 90  BUN 10 7  CREATININE 0.58 0.62  CALCIUM 8.7* 7.5*   PT/INR No results for input(s): LABPROT, INR in the last 72 hours. CMP     Component Value Date/Time   NA 139 04/01/2016 0653   NA 142 03/17/2016 1657   K 3.7 04/01/2016 0653   CL 103 04/01/2016 0653   CO2 29 04/01/2016 0653   GLUCOSE 90 04/01/2016 0653   BUN 7 04/01/2016 0653   BUN 7 (L) 03/17/2016 1657   CREATININE 0.62 04/01/2016 0653   CREATININE 0.69 10/11/2013 1700   CALCIUM 7.5 (L) 04/01/2016 0653   PROT 5.2 (L) 03/31/2016 1618   ALBUMIN 2.3 (L) 03/31/2016 1618   AST 51 (H) 03/31/2016 1618   ALT 21 03/31/2016 1618   ALKPHOS 114 03/31/2016 1618   BILITOT 0.9 03/31/2016 1618   GFRNONAA >60 04/01/2016 0653   GFRNONAA 88 10/11/2013 1700   GFRAA >60 04/01/2016 0653   GFRAA >89 10/11/2013 1700   Lipase     Component Value Date/Time   LIPASE 17 03/31/2016 1618       Studies/Results: Ct Abdomen Pelvis W Contrast  Result Date: 03/31/2016 CLINICAL DATA:  Left-sided abdominal pain. Nausea vomiting diarrhea 3 weeks. History of colostomy EXAM: CT ABDOMEN AND PELVIS WITH CONTRAST TECHNIQUE: Multidetector CT imaging of the abdomen and pelvis was performed using the standard protocol following bolus administration of intravenous contrast. CONTRAST:  47ml ISOVUE-300 IOPAMIDOL (ISOVUE-300) INJECTION 61% COMPARISON:  CT abdomen pelvis 09/02/2015 FINDINGS: Lower chest: Mild scarring in the lung bases. No infiltrate or effusion. Hepatobiliary: Low-density throughout the liver compatible with extensive fatty infiltration. Small gallstones again noted without gallbladder wall thickening or biliary dilatation Pancreas: Pancreatic atrophy without acute abnormality. Spleen: Negative Adrenals/Urinary Tract: Negative Stomach/Bowel: Normal stomach. Small bowel nondilated. Left colectomy with apparent mucous fistula in the rectum. Left colostomy in the left abdomen. There is a peristomal hernia containing a loop of transverse colon. Right and transverse colon are mildly dilated and could  be partially obstructed. There is no fluid or edema in the hernia sac. There is thickening of the descending colon which could be due to colitis. This bowel extends into the ostomy. Vascular/Lymphatic: Extensive atherosclerotic disease.  No aneurysm. Reproductive: Hysterectomy.  No pelvic mass Other: No free fluid or abscess. Musculoskeletal: Chronic compression fracture T12 is unchanged. No acute skeletal abnormality. IMPRESSION: Left colectomy and left colostomy. Peristomal hernia again noted which  is chronic. There is a loop of transverse colon extending into the ostomy which is dilated and could be causing partial obstruction of the right colon. Small bowel nondilated. There is edema and thickening of the left colon which could represent ischemia or colitis. This loop extends into the ostomy and appears edematous within the ostomy. Severe fatty infiltration liver.  Cholelithiasis. Severe atherosclerotic disease Electronically Signed   By: Franchot Gallo M.D.   On: 03/31/2016 19:08    Anti-infectives: Anti-infectives    Start     Dose/Rate Route Frequency Ordered Stop   04/01/16 1200  ampicillin-sulbactam (UNASYN) 1.5 g in sodium chloride 0.9 % 50 mL IVPB     1.5 g 100 mL/hr over 30 Minutes Intravenous Every 6 hours 04/01/16 1049     04/01/16 1000  ciprofloxacin (CIPRO) IVPB 400 mg  Status:  Discontinued     400 mg 200 mL/hr over 60 Minutes Intravenous Every 12 hours 03/31/16 2115 04/01/16 1049   03/31/16 2200  metroNIDAZOLE (FLAGYL) IVPB 500 mg  Status:  Discontinued     500 mg 100 mL/hr over 60 Minutes Intravenous Every 8 hours 03/31/16 2112 04/01/16 1049   03/31/16 2115  ciprofloxacin (CIPRO) IVPB 400 mg     400 mg 200 mL/hr over 60 Minutes Intravenous STAT 03/31/16 2114 04/01/16 0206       Assessment/Plan Constipation with dehydration versus partial large bowel obstruction - s/p diverting colostomy in 2013 by Dr. Johney Maine for a stercoral perforation - 3 weeks of intermittent nausea, vomiting, and increased ostomy output - WBC 9.8, afebrile today - tolerating clear liquids, stool in colostomy bag  Chronic narcotic dependence for chronic back pain Legally blind Anxiety   ID - cipro/flagyl 3/6>>3/7, unasyn 3/7>> FEN - IVF, full liquids VTE - SCDs  Plan - Advance to full liquids. Recommend decreasing narcotics as much as possible. PT consult pending. If she continues to tolerate diet and improve, can likely discharge patient with outpatient follow-up with Dr. Johney Maine to  discuss possibility of fixing parastomal hernia.   LOS: 1 day    Jerrye Beavers , [facility] Surgery 04/01/2016, 11:04 AM Pager: [phone] Consults: [phone] Mon-Fri 7:00 am-4:30 pm Sat-Sun 7:00 am-11:30 am

## 2016-04-01 NOTE — Progress Notes (Signed)
PROGRESS NOTE  [name] Bell ZOX:[id number] DOB: 1945/08/17 DOA: 03/31/2016 PCP: Albin Felling, MD  HPI/Recap of past 24 hours:  Intermittent n/v Some liquid output from colostomy Report chronic productive cough, no sob, on room air Report significant weight loss  Assessment/Plan: Principal Problem:   Colitis Active Problems:   Colostomy in place   Small bowel obstruction   Nausea and vomiting   Hypokalemia  Acute colitis with possible bowel obstruction, history of colostomy --Will only continue her MS contin by mouth for now since she has opioid dependence. --IV cipro and flagyl initially, changed to iv unasyn --NS @ 125cc/hr --Analgesics and anti-emetics as needed, persistent n/v --general surgery following, diet advancement per general surgery  Hypokalemia and contraction alkalosis in the setting of diarrhea --Potassium replacement --Empiric magnesium --BMP in the AM  Abnormal U/A without LUTS --Culture pendinig --already on abx  History of bleeding duodenal ulcer, question of coffee ground emesis --Follow H/H --IV PPI q12h for now --Presently hemodynamically stable with normal Hgb; does not appear to be having a brisk GI bleed  NSVT: 5beats around 9am this am, bp borderline low, home meds coreg held, will get echocardiogram, keep k>4, mag>2 tsh 2.3    Chronic productive cough, prior smoker, cxr on 2/20 with chronic bronchitis changes, currently on room air, no respiratory distress, will not repeat chest imaging, will start on mucinex, already on abx   Non-severe (moderate) malnutrition in context of chronic illness Body mass index is 19.89 kg/m. Nutrition input appreciated  Macular degeneration: poor vision, fall precaution, will need home health  DVT prophylaxis: SCDs Code Status: FULL Family Communication: Patient alone  Disposition Plan: home health vs SNF pending PT eval Consults called: General Surgery  Ninfa Linden)  Procedures:  none  Antibiotics:  Cipro/flagyl  unasyn   Objective: BP (!) 94/38 (BP Location: Left Arm)   Pulse 85   Temp 98 F (36.7 C) (Oral)   Resp 18   Ht 5\' 4"  (1.626 m)   Wt 52.6 kg (115 lb 14.4 oz)   LMP 08/25/1980   SpO2 97%   BMI 19.89 kg/m   Intake/Output Summary (Last 24 hours) at 04/01/16 1532 Last data filed at 04/01/16 1416  Gross per 24 hour  Intake          3344.17 ml  Output                0 ml  Net          3344.17 ml   Filed Weights   03/31/16 2113 03/31/16 2242  Weight: 55 kg (121 lb 4.1 oz) 52.6 kg (115 lb 14.4 oz)    Exam:   General:  frail  Cardiovascular: RRR  Respiratory: CTABL  Abdomen: Soft/ND/NT, positive BS, Ostomy to LLQ.  Liquid brown stool in the bag.  Musculoskeletal: No Edema  Neuro: aaox3  Data Reviewed: Basic Metabolic Panel:  Recent Labs Lab 03/31/16 1618 04/01/16 0653 04/01/16 1108  NA 135 139  --   K 2.9* 3.7  --   CL 91* 103  --   CO2 34* 29  --   GLUCOSE 109* 90  --   BUN 10 7  --   CREATININE 0.58 0.62  --   CALCIUM 8.7* 7.5*  --   MG 1.8  --  2.0   Liver Function Tests:  Recent Labs Lab 03/31/16 1618  AST 51*  ALT 21  ALKPHOS 114  BILITOT 0.9  PROT 5.2*  ALBUMIN 2.3*  Recent Labs Lab 03/31/16 1618  LIPASE 17   No results for input(s): AMMONIA in the last 168 hours. CBC:  Recent Labs Lab 03/31/16 1618 04/01/16 0653  WBC 8.4 9.8  NEUTROABS 4.9  --   HGB 15.5* 16.7*  HCT 44.9 47.0*  MCV 90.3 91.6  PLT 236 206   Cardiac Enzymes:   No results for input(s): CKTOTAL, CKMB, CKMBINDEX, TROPONINI in the last 168 hours. BNP (last 3 results) No results for input(s): BNP in the last 8760 hours.  ProBNP (last 3 results) No results for input(s): PROBNP in the last 8760 hours.  CBG: No results for input(s): GLUCAP in the last 168 hours.  Recent Results (from the past 240 hour(s))  MRSA culture     Status: None   Collection Time: 03/31/16 11:38 PM  Result Value  Ref Range Status   Specimen Description NOSE  Final   Special Requests NONE  Final   Culture   Final    NO MRSA DETECTED Performed at [facility], [address] [place], [place] [address]    Report Status 04/01/2016 FINAL  Final  MRSA PCR Screening     Status: Abnormal   Collection Time: 03/31/16 11:39 PM  Result Value Ref Range Status   MRSA by PCR INVALID RESULTS, SPECIMEN SENT FOR CULTURE (A) NEGATIVE Final    Comment:        The GeneXpert MRSA Assay (FDA approved for NASAL specimens only), is one component of a comprehensive MRSA colonization surveillance program. It is not intended to diagnose MRSA infection nor to guide or monitor treatment for MRSA infections. RESULT CALLED TO, READ BACK BY AND VERIFIED WITH: M STEFFENS RN @ [phone] ON 04/01/16 BY C DAVIS      Studies: Ct Abdomen Pelvis W Contrast  Result Date: 03/31/2016 CLINICAL DATA:  Left-sided abdominal pain. Nausea vomiting diarrhea 3 weeks. History of colostomy EXAM: CT ABDOMEN AND PELVIS WITH CONTRAST TECHNIQUE: Multidetector CT imaging of the abdomen and pelvis was performed using the standard protocol following bolus administration of intravenous contrast. CONTRAST:  76ml ISOVUE-300 IOPAMIDOL (ISOVUE-300) INJECTION 61% COMPARISON:  CT abdomen pelvis 09/02/2015 FINDINGS: Lower chest: Mild scarring in the lung bases. No infiltrate or effusion. Hepatobiliary: Low-density throughout the liver compatible with extensive fatty infiltration. Small gallstones again noted without gallbladder wall thickening or biliary dilatation Pancreas: Pancreatic atrophy without acute abnormality. Spleen: Negative Adrenals/Urinary Tract: Negative Stomach/Bowel: Normal stomach. Small bowel nondilated. Left colectomy with apparent mucous fistula in the rectum. Left colostomy in the left abdomen. There is a peristomal hernia containing a loop of transverse colon. Right and transverse colon are mildly dilated and could be partially  obstructed. There is no fluid or edema in the hernia sac. There is thickening of the descending colon which could be due to colitis. This bowel extends into the ostomy. Vascular/Lymphatic: Extensive atherosclerotic disease.  No aneurysm. Reproductive: Hysterectomy.  No pelvic mass Other: No free fluid or abscess. Musculoskeletal: Chronic compression fracture T12 is unchanged. No acute skeletal abnormality. IMPRESSION: Left colectomy and left colostomy. Peristomal hernia again noted which is chronic. There is a loop of transverse colon extending into the ostomy which is dilated and could be causing partial obstruction of the right colon. Small bowel nondilated. There is edema and thickening of the left colon which could represent ischemia or colitis. This loop extends into the ostomy and appears edematous within the ostomy. Severe fatty infiltration liver.  Cholelithiasis. Severe atherosclerotic disease Electronically Signed   By: Juanda Crumble  Carlis Abbott M.D.   On: 03/31/2016 19:08    Scheduled Meds: . ampicillin-sulbactam (UNASYN) IV  1.5 g Intravenous Q6H  . carvedilol  3.125 mg Oral BID WC  . chlorhexidine  15 mL Mouth Rinse BID  . feeding supplement  1 Container Oral BID BM  . feeding supplement (PRO-STAT SUGAR FREE 64)  30 mL Oral BID  . guaiFENesin  600 mg Oral BID  . mouth rinse  15 mL Mouth Rinse q12n4p  . morphine  30 mg Oral Q12H  . pantoprazole (PROTONIX) IV  40 mg Intravenous Q12H  . potassium chloride (KCL MULTIRUN) 30 mEq in 265 mL IVPB  30 mEq Intravenous Once  . sodium chloride flush  3 mL Intravenous Q12H    Continuous Infusions: . sodium chloride 125 mL/hr at 04/01/16 1511     Time spent: 52mins  Halee Glynn MD, PhD  [facility] Pager [phone]. If 7PM-7AM, please contact night-coverage at [email or web address], password [email or web address] 04/01/2016, 3:32 PM  LOS: 1 day

## 2016-04-01 NOTE — Progress Notes (Signed)
PT Cancellation Note  Patient Details Name: [name] Bell MRN: [id number] DOB: 08-08-1945   Cancelled Treatment:    Reason Eval/Treat Not Completed: Medical issues which prohibited therapy--pt currently experiencing nausea-RN to medicated. Will check back another time/day. Thanks.    Weston Anna, MPT Pager: [phone]

## 2016-04-01 NOTE — Progress Notes (Signed)
PT Cancellation Note  Patient Details Name: [name] Bell MRN: [id number] DOB: 1945/10/16   Cancelled Treatment:    Reason Eval/Treat Not Completed: 2nd attempt to work with pt-pt just received lunch tray. Will check back as schedule allows. thanks.    Weston Anna, MPT Pager: [phone]

## 2016-04-01 NOTE — Progress Notes (Signed)
DrToth at bedside w/ pt.  Aware of pt's persistent nausea since she attempted to take PO KCL around 1415.  Aware pt w/ flatus and pasty stool per ostomy.  Examining pt at present.
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
## 2016-04-02 ENCOUNTER — Inpatient Hospital Stay (HOSPITAL_COMMUNITY): Payer: Medicare Other

## 2016-04-02 DIAGNOSIS — R9431 Abnormal electrocardiogram [ECG] [EKG]: Secondary | ICD-10-CM

## 2016-04-02 LAB — BASIC METABOLIC PANEL
Anion gap: 6 (ref 5–15)
CHLORIDE: 111 mmol/L (ref 101–111)
CO2: 23 mmol/L (ref 22–32)
CREATININE: 0.45 mg/dL (ref 0.44–1.00)
Calcium: 6.8 mg/dL — ABNORMAL LOW (ref 8.9–10.3)
GFR calc Af Amer: >60 mL/min (ref >60)
GFR calc non Af Amer: >60 mL/min (ref >60)
Glucose, Bld: 98 mg/dL (ref 65–99)
POTASSIUM: 4.4 mmol/L (ref 3.5–5.1)
Sodium: 140 mmol/L (ref 135–145)

## 2016-04-02 LAB — ECHOCARDIOGRAM COMPLETE
Height: 64 in
WEIGHTICAEL: 1854.4 [oz_av]

## 2016-04-02 LAB — CBC WITH DIFFERENTIAL/PLATELET
Basophils Absolute: 0 10*3/uL (ref 0.0–0.1)
Basophils Relative: 0 %
Eosinophils Absolute: 0.1 10*3/uL (ref 0.0–0.7)
Eosinophils Relative: 1 %
HCT: 40.5 % (ref 36.0–46.0)
HEMOGLOBIN: 13.6 g/dL (ref 12.0–15.0)
Lymphocytes Relative: 26 %
Lymphs Abs: 2.3 10*3/uL (ref 0.7–4.0)
MCH: 30.9 pg (ref 26.0–34.0)
MCHC: 33.6 g/dL (ref 30.0–36.0)
MCV: 92 fL (ref 78.0–100.0)
MONOS PCT: 8 %
Monocytes Absolute: 0.7 10*3/uL (ref 0.1–1.0)
NEUTROS PCT: 65 %
Neutro Abs: 5.9 10*3/uL (ref 1.7–7.7)
Platelets: 226 10*3/uL (ref 150–400)
RBC: 4.4 MIL/uL (ref 3.87–5.11)
RDW: 14.6 % (ref 11.5–15.5)
WBC: 9 10*3/uL (ref 4.0–10.5)

## 2016-04-02 LAB — URINE CULTURE

## 2016-04-02 MED ORDER — CARVEDILOL 3.125 MG PO TABS
3.1250 mg | ORAL_TABLET | Freq: Two times a day (BID) | ORAL | Status: DC
  Administered 2016-04-02 – 2016-04-03 (×3): 3.125 mg via ORAL
  Filled 2016-04-02 (×4): qty 1

## 2016-04-02 NOTE — Progress Notes (Signed)
PT Cancellation Note  Patient Details Name: [name] Bell MRN: [id number] DOB: 05/04/1945   Cancelled Treatment:    Reason Eval/Treat Not Completed: Medical issues which prohibited therapy. Pt currently experiencing N/V. Will check back another time   Weston Anna, MPT Pager: [phone]

## 2016-04-02 NOTE — Clinical Social Work Placement (Signed)
   CLINICAL SOCIAL WORK PLACEMENT  NOTE  Date:  04/02/2016  Patient Details  Name: [name] Bell MRN: [id number] Date of Birth: 21-Aug-1945  Clinical Social Work is seeking post-discharge placement for this patient at the [place] level of care (*CSW will initial, date and re-position this form in  chart as items are completed):  Yes   Patient/family provided with [place] Work Department's list of facilities offering this level of care within the geographic area requested by the patient (or if unable, by the patient's family).  Yes   Patient/family informed of their freedom to choose among providers that offer the needed level of care, that participate in Medicare, Medicaid or managed care program needed by the patient, have an available bed and are willing to accept the patient.  Yes   Patient/family informed of [facility]'s ownership interest in [facility] and [facility], as well as of the fact that they are under no obligation to receive care at these facilities.  PASRR submitted to EDS on       PASRR number received on       Existing PASRR number confirmed on 04/02/16     FL2 transmitted to all facilities in geographic area requested by pt/family on 04/02/16     FL2 transmitted to all facilities within larger geographic area on       Patient informed that his/her managed care company has contracts with or will negotiate with certain facilities, including the following:            Patient/family informed of bed offers received.  Patient chooses bed at       Physician recommends and patient chooses bed at      Patient to be transferred to   on  .  Patient to be transferred to facility by       Patient family notified on   of transfer.  Name of family member notified:        PHYSICIAN       Additional Comment:    _______________________________________________ Standley Brooking, LCSW 04/02/2016, 3:13 PM

## 2016-04-02 NOTE — Evaluation (Signed)
Physical Therapy Evaluation Patient Details Name: JAHDAI PADOVANO MRN: [id number] DOB: [birth year] Today's Date: 04/02/2016   History of Present Illness  71 yo female admitted with colitis. Hx of colectomy, duodenal ulcer, mac degeneration, chronic narcotics dependence, chronic back pain, legally blind.   Clinical Impression  On eval, pt required Min assist for mobility. She walked ~15'x2 in room, to and from bathroom. Pt presents with generalized weakness, decreased activity tolerance, and impaired gait and balance. Discussed d/c plan-pt stated that she did not feel she could take care of herself if she were to return home. Recommend [facility] for continued therapy. Will continue to follow and progress activity as tolerated.     Follow Up Recommendations SNF    Equipment Recommendations  None recommended by PT    Recommendations for Other Services       Precautions / Restrictions Precautions Precautions: Fall Restrictions Weight Bearing Restrictions: No      Mobility  Bed Mobility Overal bed mobility: Needs Assistance Bed Mobility: Sidelying to Sit;Sit to Sidelying   Sidelying to sit: Min assist     Sit to sidelying: Min assist General bed mobility comments: Assist for trunk and LEs onto bed. Increased time.   Transfers Overall transfer level: Needs assistance   Transfers: Sit to/from Stand Sit to Stand: Min assist         General transfer comment: Assist to rise, stabilize control descent.   Ambulation/Gait Ambulation/Gait assistance: Min assist Ambulation Distance (Feet): 15 Feet (x2)   Gait Pattern/deviations: Step-through pattern;Decreased stride length     General Gait Details: Assist to stabilize pt throughout ambulation distance. Pt fatigues easily/quickly. Walked to and from bathroom. Pt unable to tolerate hallway ambulation on today.  Stairs            Wheelchair Mobility    Modified Rankin (Stroke Patients Only)       Balance                                              Pertinent Vitals/Pain Pain Assessment: Faces Faces Pain Scale: No hurt    Home Living Family/patient expects to be discharged to:: Private residence Living Arrangements: Alone   Type of Home: House       Home Layout: One level Home Equipment: Environmental consultant - 4 wheels;Cane - single point      Prior Function Level of Independence: Needs assistance   Gait / Transfers Assistance Needed: no device used when walking in apt. she uses a cane when outside  ADL's / Homemaking Assistance Needed: up until 3 weeks PTA, pt was independent. As of later, aide has had to assist with "everything" per pt.         Hand Dominance        Extremity/Trunk Assessment   Upper Extremity Assessment Upper Extremity Assessment: Overall WFL for tasks assessed    Lower Extremity Assessment Lower Extremity Assessment: Generalized weakness    Cervical / Trunk Assessment Cervical / Trunk Assessment: Kyphotic  Communication   Communication: No difficulties  Cognition Arousal/Alertness: Awake/alert Behavior During Therapy: WFL for tasks assessed/performed Overall Cognitive Status: Within Functional Limits for tasks assessed                      General Comments      Exercises     Assessment/Plan    PT  Assessment Patient needs continued PT services  PT Problem List Decreased strength;Decreased mobility;Decreased activity tolerance;Decreased balance;Decreased knowledge of use of DME;Pain       PT Treatment Interventions DME instruction;Therapeutic activities;Gait training;Patient/family education;Functional mobility training    PT Goals (Current goals can be found in the Care Plan section)  Acute Rehab PT Goals Patient Stated Goal: to get better PT Goal Formulation: With patient Time For Goal Achievement: 04/16/16 Potential to Achieve Goals: Good    Frequency Min 3X/week   Barriers to discharge        Co-evaluation                End of Session   Activity Tolerance: Patient limited by fatigue Patient left: in bed;with call bell/phone within reach;with bed alarm set   PT Visit Diagnosis: Muscle weakness (generalized) (M62.81);Difficulty in walking, not elsewhere classified (R26.2)         Time: 1440-1500 PT Time Calculation (min) (ACUTE ONLY): 20 min   Charges:   PT Evaluation $PT Eval Low Complexity: 1 Procedure     PT G Codes:         Weston Anna, MPT Pager: [phone]

## 2016-04-02 NOTE — Progress Notes (Signed)
Patient ID: [name] Bell, female   DOB: 1945/04/01, 71 y.o.   MRN: [id number]  [facility] Surgery Progress Note     Subjective: Continues to have air and stool in colostomy bag, but she has not been able to tolerate PO intake. Persistent n/v last night and this morning. Vomiting up phlegm. States that she feels weak.  Objective: Vital signs in last 24 hours: Temp:  [98 F (36.7 C)-99.5 F (37.5 C)] 99.4 F (37.4 C) (03/08 0442) Pulse Rate:  [85-101] 101 (03/08 0442) Resp:  [18] 18 (03/08 0442) BP: (94-118)/(38-67) 110/67 (03/08 0442) SpO2:  [97 %-99 %] 99 % (03/08 0442) Last BM Date: 04/02/16  Intake/Output from previous day: 03/07 0701 - 03/08 0700 In: 5834.2 [P.O.:1740; I.V.:2629.2; IV Piggyback:1465] Out: 1800 [Urine:1800] Intake/Output this shift: No intake/output data recorded.  PE: Gen:  Alert, NAD, pleasant. Actively vomited phlegm while I was in the room. Eyes: blind Card:  RRR, no M/G/R heard Pulm:  CTAB, no W/R/R, effort normal Abd: Soft, NT/ND, +BS, LLQ colostomy with air and liquid stool in bag, parastomal hernia soft Ext:  No erythema, edema, or tenderness   Lab Results:   Recent Labs  04/01/16 0653 04/02/16 0625  WBC 9.8 9.0  HGB 16.7* 13.6  HCT 47.0* 40.5  PLT 206 226   BMET  Recent Labs  04/01/16 0653 04/02/16 0625  NA 139 140  K 3.7 4.4  CL 103 111  CO2 29 23  GLUCOSE 90 98  BUN 7 <5*  CREATININE 0.62 0.45  CALCIUM 7.5* 6.8*   PT/INR No results for input(s): LABPROT, INR in the last 72 hours. CMP     Component Value Date/Time   NA 140 04/02/2016 0625   NA 142 03/17/2016 1657   K 4.4 04/02/2016 0625   CL 111 04/02/2016 0625   CO2 23 04/02/2016 0625   GLUCOSE 98 04/02/2016 0625   BUN <5 (L) 04/02/2016 0625   BUN 7 (L) 03/17/2016 1657   CREATININE 0.45 04/02/2016 0625   CREATININE 0.69 10/11/2013 1700   CALCIUM 6.8 (L) 04/02/2016 0625   PROT 5.2 (L) 03/31/2016 1618   ALBUMIN 2.3 (L) 03/31/2016 1618   AST 51 (H)  03/31/2016 1618   ALT 21 03/31/2016 1618   ALKPHOS 114 03/31/2016 1618   BILITOT 0.9 03/31/2016 1618   GFRNONAA >60 04/02/2016 0625   GFRNONAA 88 10/11/2013 1700   GFRAA >60 04/02/2016 0625   GFRAA >89 10/11/2013 1700   Lipase     Component Value Date/Time   LIPASE 17 03/31/2016 1618       Studies/Results: Ct Abdomen Pelvis W Contrast  Result Date: 03/31/2016 CLINICAL DATA:  Left-sided abdominal pain. Nausea vomiting diarrhea 3 weeks. History of colostomy EXAM: CT ABDOMEN AND PELVIS WITH CONTRAST TECHNIQUE: Multidetector CT imaging of the abdomen and pelvis was performed using the standard protocol following bolus administration of intravenous contrast. CONTRAST:  86ml ISOVUE-300 IOPAMIDOL (ISOVUE-300) INJECTION 61% COMPARISON:  CT abdomen pelvis 09/02/2015 FINDINGS: Lower chest: Mild scarring in the lung bases. No infiltrate or effusion. Hepatobiliary: Low-density throughout the liver compatible with extensive fatty infiltration. Small gallstones again noted without gallbladder wall thickening or biliary dilatation Pancreas: Pancreatic atrophy without acute abnormality. Spleen: Negative Adrenals/Urinary Tract: Negative Stomach/Bowel: Normal stomach. Small bowel nondilated. Left colectomy with apparent mucous fistula in the rectum. Left colostomy in the left abdomen. There is a peristomal hernia containing a loop of transverse colon. Right and transverse colon are mildly dilated and could be partially obstructed. There  is no fluid or edema in the hernia sac. There is thickening of the descending colon which could be due to colitis. This bowel extends into the ostomy. Vascular/Lymphatic: Extensive atherosclerotic disease.  No aneurysm. Reproductive: Hysterectomy.  No pelvic mass Other: No free fluid or abscess. Musculoskeletal: Chronic compression fracture T12 is unchanged. No acute skeletal abnormality. IMPRESSION: Left colectomy and left colostomy. Peristomal hernia again noted which is chronic.  There is a loop of transverse colon extending into the ostomy which is dilated and could be causing partial obstruction of the right colon. Small bowel nondilated. There is edema and thickening of the left colon which could represent ischemia or colitis. This loop extends into the ostomy and appears edematous within the ostomy. Severe fatty infiltration liver.  Cholelithiasis. Severe atherosclerotic disease Electronically Signed   By: Franchot Gallo M.D.   On: 03/31/2016 19:08    Anti-infectives: Anti-infectives    Start     Dose/Rate Route Frequency Ordered Stop   04/02/16 0200  ampicillin-sulbactam (UNASYN) 1.5 g in sodium chloride 0.9 % 50 mL IVPB     1.5 g 100 mL/hr over 30 Minutes Intravenous Every 6 hours 04/01/16 2046     04/01/16 1200  ampicillin-sulbactam (UNASYN) 1.5 g in sodium chloride 0.9 % 50 mL IVPB  Status:  Discontinued     1.5 g 100 mL/hr over 30 Minutes Intravenous Every 6 hours 04/01/16 1049 04/01/16 2046   04/01/16 1000  ciprofloxacin (CIPRO) IVPB 400 mg  Status:  Discontinued     400 mg 200 mL/hr over 60 Minutes Intravenous Every 12 hours 03/31/16 2115 04/01/16 1049   03/31/16 2200  metroNIDAZOLE (FLAGYL) IVPB 500 mg  Status:  Discontinued     500 mg 100 mL/hr over 60 Minutes Intravenous Every 8 hours 03/31/16 2112 04/01/16 1049   03/31/16 2115  ciprofloxacin (CIPRO) IVPB 400 mg     400 mg 200 mL/hr over 60 Minutes Intravenous STAT 03/31/16 2114 04/01/16 0206       Assessment/Plan Constipation with dehydration versus partial large bowel obstruction - s/p diverting colostomy in 2013 by Dr. Johney Maine for a stercoral perforation - 3 weeks of intermittent nausea, vomiting, and increased ostomy output - WBC 9.0, afebrile today  Chronic narcotic dependence for chronic back pain Chronic constipation Legally blind Anxiety   ID - cipro/flagyl 3/6>>3/7, unasyn 3/7>> FEN - IVF, full liquids VTE - SCDs  Plan - reviewed CT with MD. Does not appear to have any signs of  obstruction. N/v does not appear to be a surgical problem. Recommend consulting GI for eval of n/v, possible endoscopy?   LOS: 2 days    Jerrye Beavers , [facility] Surgery 04/02/2016, 10:05 AM Pager: [phone] Consults: [phone] Mon-Fri 7:00 am-4:30 pm Sat-Sun 7:00 am-11:30 am
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
## 2016-04-02 NOTE — Progress Notes (Signed)
PROGRESS NOTE  [name] Bell LHT:[id number] DOB: 1945-07-24 DOA: 03/31/2016 PCP: Albin Felling, MD  HPI/Recap of past 24 hours:  Intermittent n/v Some liquid output from colostomy Report chronic productive cough, no sob, on room air Report significant weight loss  Assessment/Plan: Principal Problem:   Colitis Active Problems:   Colostomy in place   Small bowel obstruction   Nausea and vomiting   Hypokalemia  Acute colitis with possible bowel obstruction, history of colostomy -- MS contin by mouth continued since she has opioid dependence. --IV cipro and flagyl initially, changed to iv unasyn --she received NS @ 125cc/hr since admission, ivf stopped on 3/8 --general surgery following, no indication for surgery, but persistent n/v, not able to tolerate diet advancement, general surgery recommended gi consult, [facility] paged, awaiting return call (per chart review patient has a history of gi outlet obstruction required EGD dilation in 2009)   Hypokalemia and contraction alkalosis in the setting of diarrhea --Potassium replacement --Empiric magnesium --BMP in the AM  Abnormal U/A without LUTS --Culture pendinig --already on abx  History of bleeding duodenal ulcer, question of coffee ground emesis --Follow H/H --IV PPI q12h for now --Presently hemodynamically stable with normal Hgb; does not appear to be having a brisk GI bleed  NSVT: 5beats around 9am this am, bp borderline low, home meds coreg held, will get echocardiogram, keep k>4, mag>2 tsh 2.3    Chronic productive cough, prior smoker, cxr on 2/20 with chronic bronchitis changes, currently on room air, no respiratory distress, will not repeat chest imaging, will start on mucinex, already on abx   severe (moderate) malnutrition in context of chronic illness Body mass index is 19.89 kg/m. Nutrition input appreciated  Macular degeneration: poor vision, fall precaution,   DVT prophylaxis: SCDs Code Status:  FULL Family Communication: Patient alone , report no immediate family Disposition Plan: SNF Consults called:  General Surgery  [facility]  Procedures:  none  Antibiotics:  Cipro/flagyl  unasyn   Objective: BP (!) 103/44 (BP Location: Left Arm)   Pulse 99   Temp 99.5 F (37.5 C) (Oral)   Resp 18   Ht 5\' 4"  (1.626 m)   Wt 52.6 kg (115 lb 14.4 oz)   LMP 08/25/1980   SpO2 96%   BMI 19.89 kg/m   Intake/Output Summary (Last 24 hours) at 04/02/16 1531 Last data filed at 04/02/16 1300  Gross per 24 hour  Intake          4420.83 ml  Output             2200 ml  Net          2220.83 ml   Filed Weights   03/31/16 2113 03/31/16 2242  Weight: 55 kg (121 lb 4.1 oz) 52.6 kg (115 lb 14.4 oz)    Exam:   General:  Frail, legally blind at baseline for years  Cardiovascular: RRR  Respiratory: CTABL  Abdomen: Soft/ND/NT, positive BS, Ostomy to LLQ.  Liquid brown stool in the bag.  Musculoskeletal: No Edema  Neuro: aaox3  Data Reviewed: Basic Metabolic Panel:  Recent Labs Lab 03/31/16 1618 04/01/16 0653 04/01/16 1108 04/02/16 0625  NA 135 139  --  140  K 2.9* 3.7  --  4.4  CL 91* 103  --  111  CO2 34* 29  --  23  GLUCOSE 109* 90  --  98  BUN 10 7  --  <5*  CREATININE 0.58 0.62  --  0.45  CALCIUM 8.7*  7.5*  --  6.8*  MG 1.8  --  2.0  --    Liver Function Tests:  Recent Labs Lab 03/31/16 1618  AST 51*  ALT 21  ALKPHOS 114  BILITOT 0.9  PROT 5.2*  ALBUMIN 2.3*    Recent Labs Lab 03/31/16 1618  LIPASE 17   No results for input(s): AMMONIA in the last 168 hours. CBC:  Recent Labs Lab 03/31/16 1618 04/01/16 0653 04/02/16 0625  WBC 8.4 9.8 9.0  NEUTROABS 4.9  --  5.9  HGB 15.5* 16.7* 13.6  HCT 44.9 47.0* 40.5  MCV 90.3 91.6 92.0  PLT 236 206 226   Cardiac Enzymes:   No results for input(s): CKTOTAL, CKMB, CKMBINDEX, TROPONINI in the last 168 hours. BNP (last 3 results) No results for input(s): BNP in the last 8760 hours.  ProBNP (last  3 results) No results for input(s): PROBNP in the last 8760 hours.  CBG: No results for input(s): GLUCAP in the last 168 hours.  Recent Results (from the past 240 hour(s))  Urine culture     Status: Abnormal   Collection Time: 03/31/16  4:00 PM  Result Value Ref Range Status   Specimen Description URINE, CLEAN CATCH  Final   Special Requests NONE  Final   Culture MULTIPLE SPECIES PRESENT, SUGGEST RECOLLECTION (A)  Final   Report Status 04/02/2016 FINAL  Final  MRSA culture     Status: None   Collection Time: 03/31/16 11:38 PM  Result Value Ref Range Status   Specimen Description NOSE  Final   Special Requests NONE  Final   Culture   Final    NO MRSA DETECTED Performed at [facility], [address] [place],  [address]    Report Status 04/01/2016 FINAL  Final  MRSA PCR Screening     Status: Abnormal   Collection Time: 03/31/16 11:39 PM  Result Value Ref Range Status   MRSA by PCR INVALID RESULTS, SPECIMEN SENT FOR CULTURE (A) NEGATIVE Final    Comment:        The GeneXpert MRSA Assay (FDA approved for NASAL specimens only), is one component of a comprehensive MRSA colonization surveillance program. It is not intended to diagnose MRSA infection nor to guide or monitor treatment for MRSA infections. RESULT CALLED TO, READ BACK BY AND VERIFIED WITH: M STEFFENS RN @ [phone] ON 04/01/16 BY C DAVIS      Studies: No results found.  Scheduled Meds: . ampicillin-sulbactam (UNASYN) IV  1.5 g Intravenous Q6H  . carvedilol  3.125 mg Oral BID WC  . chlorhexidine  15 mL Mouth Rinse BID  . feeding supplement  1 Container Oral BID BM  . feeding supplement (PRO-STAT SUGAR FREE 64)  30 mL Oral BID  . guaiFENesin  600 mg Oral BID  . mouth rinse  15 mL Mouth Rinse q12n4p  . morphine  30 mg Oral Q12H  . pantoprazole (PROTONIX) IV  40 mg Intravenous Q12H  . sodium chloride flush  3 mL Intravenous Q12H    Continuous Infusions:    Time spent: 61mins  Keisha Amer MD,  PhD  [facility] Pager [phone]. If 7PM-7AM, please contact night-coverage at [email or web address], password [facility] 04/02/2016, 3:31 PM  LOS: 2 days

## 2016-04-02 NOTE — [place] FL2 (Signed)
[place] MEDICAID FL2 LEVEL OF CARE SCREENING TOOL     IDENTIFICATION  Patient Name: SANJA ELIZARDO Birthdate: 1945/05/20 Sex: female Admission Date (Current Location): 03/31/2016  [facility] and [place] Number:  Herbalist and Address:  [facility],  [place] [address] [place]      Provider Number: [id number]  Attending Physician Name and Address:  Florencia Reasons, MD  Relative Name and Phone Number:       Current Level of Care: Hospital Recommended Level of Care: [place] Prior Approval Number:    Date Approved/Denied:   PASRR Number: [id number]  Discharge Plan: SNF    Current Diagnoses: Patient Active Problem List   Diagnosis Date Noted  . Small bowel obstruction 03/31/2016  . Nausea and vomiting 03/31/2016  . Hypokalemia 03/31/2016  . Viral illness 03/19/2016  . Sinus tachycardia 09/11/2015  . New daily persistent headache 06/27/2014  . Sinusitis, chronic 01/15/2014  . Dyspepsia 10/19/2013  . Colitis 09/29/2012  . Macular degeneration 01/14/2012  . Preventative health care 01/14/2012  . COPD (chronic obstructive pulmonary disease) ([place]) 11/25/2011  . Colostomy in place 10/27/2011  . Constipation 07/17/2010  . Hyperlipidemia 01/24/2009  . Opioid dependence ([place]) 01/03/2009  . Depression 03/08/2008  . Osteoporosis 03/03/2007    Orientation RESPIRATION BLADDER Height & Weight     Self, Time, Situation, Place  Normal Continent Weight: 115 lb 14.4 oz (52.6 kg) Height:  5\' 4"  (162.6 cm)  BEHAVIORAL SYMPTOMS/MOOD NEUROLOGICAL BOWEL NUTRITION STATUS      Continent Diet (Full Liquid)  AMBULATORY STATUS COMMUNICATION OF NEEDS Skin   Extensive Assist Verbally Normal                       Personal Care Assistance Level of Assistance  Bathing, Dressing Bathing Assistance: Limited assistance   Dressing Assistance: Limited assistance     Functional Limitations Info             SPECIAL CARE FACTORS FREQUENCY   PT (By licensed PT), OT (By licensed OT)     PT Frequency: 5 OT Frequency: 5            Contractures      Additional Factors Info  Code Status, Allergies Code Status Info: Fullcode Allergies Info: Ibuprofen, Nsaids, Latex           Current Medications (04/02/2016):  This is the current hospital active medication list Current Facility-Administered Medications  Medication Dose Route Frequency Provider Last Rate Last Dose  . acetaminophen (TYLENOL) tablet 650 mg  650 mg Oral Q6H PRN Lily Kocher, MD       Or  . acetaminophen (TYLENOL) suppository 650 mg  650 mg Rectal Q6H PRN Lily Kocher, MD      . albuterol (PROVENTIL) (2.5 MG/3ML) 0.083% nebulizer solution 2.5 mg  2.5 mg Nebulization Q6H PRN Lily Kocher, MD      . ampicillin-sulbactam (UNASYN) 1.5 g in sodium chloride 0.9 % 50 mL IVPB  1.5 g Intravenous Q6H Terri L Green, RPH   1.5 g at 04/02/16 1346  . carvedilol (COREG) tablet 3.125 mg  3.125 mg Oral BID WC Florencia Reasons, MD      . chlorhexidine (PERIDEX) 0.12 % solution 15 mL  15 mL Mouth Rinse BID Lily Kocher, MD   15 mL at 04/02/16 0745  . feeding supplement (BOOST / RESOURCE BREEZE) liquid 1 Container  1 Container Oral BID BM Florencia Reasons, MD   1 Container at  04/01/16 1406  . feeding supplement (PRO-STAT SUGAR FREE 64) liquid 30 mL  30 mL Oral BID Florencia Reasons, MD   30 mL at 04/02/16 0745  . guaiFENesin (MUCINEX) 12 hr tablet 600 mg  600 mg Oral BID Florencia Reasons, MD   600 mg at 04/02/16 0745  . HYDROmorphone (DILAUDID) injection 0.5 mg  0.5 mg Intravenous Q3H PRN Lily Kocher, MD   0.5 mg at 04/02/16 1128  . MEDLINE mouth rinse  15 mL Mouth Rinse q12n4p Lily Kocher, MD      . metoprolol (LOPRESSOR) injection 2.5 mg  2.5 mg Intravenous Q4H PRN Florencia Reasons, MD      . morphine (MS CONTIN) 12 hr tablet 30 mg  30 mg Oral Q12H Lily Kocher, MD   30 mg at 04/01/16 2205  . ondansetron (ZOFRAN) tablet 4 mg  4 mg Oral Q6H PRN Lily Kocher, MD       Or  . ondansetron [facility]) injection 4 mg  4 mg  Intravenous Q6H PRN Lily Kocher, MD   4 mg at 04/02/16 0724  . pantoprazole (PROTONIX) injection 40 mg  40 mg Intravenous Q12H Lily Kocher, MD   40 mg at 04/02/16 0745  . promethazine (PHENERGAN) injection 12.5 mg  12.5 mg Intravenous Q6H PRN Florencia Reasons, MD   12.5 mg at 04/01/16 1549  . sodium chloride flush (NS) 0.9 % injection 3 mL  3 mL Intravenous Q12H Lily Kocher, MD         Discharge Medications: Please see discharge summary for a list of discharge medications.  Relevant Imaging Results:  Relevant Lab Results:   Additional Information SSN: [id number]  Standley Brooking, LCSW

## 2016-04-02 NOTE — Progress Notes (Signed)
  Echocardiogram 2D Echocardiogram has been performed.  Bobbye Charleston 04/02/2016, 4:15 PM

## 2016-04-02 NOTE — Clinical Social Work Note (Signed)
Clinical Social Work Assessment  Patient Details  Name: [name] Bell MRN: [id number] Date of Birth: 08-04-1945  Date of referral:  04/02/16               Reason for consult:  Facility Placement                Permission sought to share information with:  Facility Art therapist granted to share information::  Yes, Verbal Permission Granted  Name::        Agency::     Relationship::     Contact Information:     Housing/Transportation Living arrangements for the past 2 months:  [place] of Information:  Patient Patient Interpreter Needed:  None Criminal Activity/Legal Involvement Pertinent to Current Situation/Hospitalization:  No - Comment as needed Significant Relationships:  Friend Lives with:  Self Do you feel safe going back to the place where you live?  No Need for family participation in patient care:  No (Coment)  Care giving concerns:  CSW reviewed PT evaluation recommending SNF at discharge.    Social Worker assessment / plan:  CSW spoke with patient who states that she was living at [facility] but is agreeable with plan for SNF at discharge. Patient states that she's been to [facility] in the past. CSW sent information out, left message for [place] [facility] [place] - awaiting response re: bed availability.   Employment status:  Retired Nurse, adult PT Recommendations:  [place] / Referral to community resources:  [place]  Patient/Family's Response to care:    Patient/Family's Understanding of and Emotional Response to Diagnosis, Current Treatment, and Prognosis:    Emotional Assessment Appearance:  Appears stated age Attitude/Demeanor/Rapport:    Affect (typically observed):    Orientation:  Oriented to Self, Oriented to Place, Oriented to  Time, Oriented to Situation Alcohol / Substance use:    Psych involvement (Current and  /or in the community):     Discharge Needs  Concerns to be addressed:    Readmission within the last 30 days:    Current discharge risk:    Barriers to Discharge:      Standley Brooking, LCSW 04/02/2016, 3:11 PM

## 2016-04-03 ENCOUNTER — Inpatient Hospital Stay (HOSPITAL_COMMUNITY): Payer: Medicare Other

## 2016-04-03 LAB — CBC WITH DIFFERENTIAL/PLATELET
BASOS ABS: 0 10*3/uL (ref 0.0–0.1)
Basophils Relative: 0 %
EOS PCT: 0 %
Eosinophils Absolute: 0 10*3/uL (ref 0.0–0.7)
HEMATOCRIT: 40.6 % (ref 36.0–46.0)
Hemoglobin: 14 g/dL (ref 12.0–15.0)
LYMPHS PCT: 35 %
Lymphs Abs: 3.5 10*3/uL (ref 0.7–4.0)
MCH: 31.7 pg (ref 26.0–34.0)
MCHC: 34.5 g/dL (ref 30.0–36.0)
MCV: 92.1 fL (ref 78.0–100.0)
MONO ABS: 0.8 10*3/uL (ref 0.1–1.0)
MONOS PCT: 9 %
NEUTROS ABS: 5.5 10*3/uL (ref 1.7–7.7)
Neutrophils Relative %: 56 %
PLATELETS: 205 10*3/uL (ref 150–400)
RBC: 4.41 MIL/uL (ref 3.87–5.11)
RDW: 14.2 % (ref 11.5–15.5)
WBC: 9.8 10*3/uL (ref 4.0–10.5)

## 2016-04-03 LAB — COMPREHENSIVE METABOLIC PANEL
ALT: 19 U/L (ref 14–54)
ANION GAP: 5 (ref 5–15)
AST: 42 U/L — ABNORMAL HIGH (ref 15–41)
Albumin: 1.9 g/dL — ABNORMAL LOW (ref 3.5–5.0)
Alkaline Phosphatase: 93 U/L (ref 38–126)
BILIRUBIN TOTAL: 0.7 mg/dL (ref 0.3–1.2)
CHLORIDE: 107 mmol/L (ref 101–111)
CO2: 27 mmol/L (ref 22–32)
Calcium: 7.1 mg/dL — ABNORMAL LOW (ref 8.9–10.3)
Creatinine, Ser: 0.41 mg/dL — ABNORMAL LOW (ref 0.44–1.00)
GFR calc non Af Amer: >60 mL/min (ref >60)
Glucose, Bld: 97 mg/dL (ref 65–99)
POTASSIUM: 3.7 mmol/L (ref 3.5–5.1)
Sodium: 139 mmol/L (ref 135–145)
TOTAL PROTEIN: 4.4 g/dL — AB (ref 6.5–8.1)

## 2016-04-03 MED ORDER — IOPAMIDOL (ISOVUE-300) INJECTION 61%
INTRAVENOUS | Status: AC
  Filled 2016-04-03: qty 100

## 2016-04-03 MED ORDER — SODIUM CHLORIDE 0.9 % IV SOLN
INTRAVENOUS | Status: AC
  Administered 2016-04-03 – 2016-04-04 (×2): via INTRAVENOUS

## 2016-04-03 MED ORDER — SUCRALFATE 1 GM/10ML PO SUSP
1.0000 g | Freq: Three times a day (TID) | ORAL | Status: DC
  Administered 2016-04-03 – 2016-04-07 (×16): 1 g via ORAL
  Filled 2016-04-03 (×16): qty 10

## 2016-04-03 NOTE — Progress Notes (Signed)
Patient ID: [name] Bell, female   DOB: [birth year], 71 y.o.   MRN: [id number]  [facility] Surgery Progress Note     Subjective: Feels the same as yesterday. Continues to have stool and air in colostomy. No emesis this AM but she does complain of persistent nausea. UGI performed earlier this morning.  Objective: Vital signs in last 24 hours: Temp:  [98.4 F (36.9 C)-99.5 F (37.5 C)] 98.4 F (36.9 C) (03/09 0647) Pulse Rate:  [86-99] 95 (03/09 0647) Resp:  [18] 18 (03/09 0647) BP: (99-157)/(44-64) 118/51 (03/09 0647) SpO2:  [95 %-96 %] 95 % (03/09 0647) Last BM Date: 04/02/16  Intake/Output from previous day: 03/08 0701 - 03/09 0700 In: 990 [P.O.:840; IV Piggyback:150] Out: 904 [Urine:903; Stool:1] Intake/Output this shift: No intake/output data recorded.  PE: Gen: Alert, NAD, pleasant. Actively vomited phlegm while I was in the room. Eyes: blind Card: RRR, no M/G/R heard Pulm: CTAB, no W/R/R, effort normal Abd: Soft, NT/ND, +BS, LLQ colostomy with air and liquid stool in bag, parastomal hernia soft Ext: No erythema, edema, or tenderness   Lab Results:   Recent Labs  04/02/16 0625 04/03/16 0606  WBC 9.0 9.8  HGB 13.6 14.0  HCT 40.5 40.6  PLT 226 205   BMET  Recent Labs  04/02/16 0625 04/03/16 0606  NA 140 139  K 4.4 3.7  CL 111 107  CO2 23 27  GLUCOSE 98 97  BUN <5* <5*  CREATININE 0.45 0.41*  CALCIUM 6.8* 7.1*   PT/INR No results for input(s): LABPROT, INR in the last 72 hours. CMP     Component Value Date/Time   NA 139 04/03/2016 0606   NA 142 03/17/2016 1657   K 3.7 04/03/2016 0606   CL 107 04/03/2016 0606   CO2 27 04/03/2016 0606   GLUCOSE 97 04/03/2016 0606   BUN <5 (L) 04/03/2016 0606   BUN 7 (L) 03/17/2016 1657   CREATININE 0.41 (L) 04/03/2016 0606   CREATININE 0.69 10/11/2013 1700   CALCIUM 7.1 (L) 04/03/2016 0606   PROT 4.4 (L) 04/03/2016 0606   ALBUMIN 1.9 (L) 04/03/2016 0606   AST 42 (H) 04/03/2016 0606   ALT 19  04/03/2016 0606   ALKPHOS 93 04/03/2016 0606   BILITOT 0.7 04/03/2016 0606   GFRNONAA >60 04/03/2016 0606   GFRNONAA 88 10/11/2013 1700   GFRAA >60 04/03/2016 0606   GFRAA >89 10/11/2013 1700   Lipase     Component Value Date/Time   LIPASE 17 03/31/2016 1618       Studies/Results: Dg Chest Port 1 View  Result Date: 04/02/2016 CLINICAL DATA:  Generalized weakness, cough. EXAM: PORTABLE CHEST 1 VIEW COMPARISON:  Radiographs of March 17, 2016. FINDINGS: The heart size and mediastinal contours are within normal limits. Both lungs are clear. Atherosclerosis of thoracic aorta is noted. No pneumothorax or pleural effusion is noted. The visualized skeletal structures are unremarkable. IMPRESSION: No acute cardiopulmonary abnormality seen.  Aortic atherosclerosis. Electronically Signed   By: Marijo Conception, M.D.   On: 04/02/2016 16:25   Dg Duanne Limerick W/water Sol Cm  Result Date: 04/03/2016 CLINICAL DATA:  71 year old female inpatient with a history of left hemicolectomy with end colostomy complicated by parastomal hernia containing the distal transverse colon, admitted with nausea and vomiting. EXAM: WATER SOLUBLE UPPER GI SERIES TECHNIQUE: Single-column upper GI series was performed using water soluble contrast. CONTRAST:  80 cc enterovue 300 PO COMPARISON:  03/31/2016 CT abdomen/pelvis. FLUOROSCOPY TIME:  Fluoroscopy Time:  2 minutes 36  seconds Radiation Exposure Index (if provided by the fluoroscopic device): 20.5 mGy Number of Acquired Spot Images: 3 FINDINGS: No dilated small bowel loops on the scout radiograph. Mild stool and gas throughout the colon. No evidence of pneumatosis or pneumoperitoneum. No pathologic soft tissue calcifications. Upper GI is significantly limited by patient's generalized weakness and limited mobility. In particular, patient unable to tolerate supine or prone positioning and unable to stand upright. Examination performed in partial elevation. Esophageal distensibility  appears grossly normal. Mild esophageal dysmotility characterized by proximal escape of the barium bolus and mild intermittent weakening of primary peristalsis in the mid to lower thoracic esophagus. No hiatal hernia. No gross esophageal mass, stricture or ulcer. Unable to assess for gastroesophageal reflux given mobility limitations. There are diffusely prominently thickened folds throughout the stomach, most prominent in the proximal body of the stomach. No discrete gastric mass. Normal gastric emptying. Limited views of the duodenum demonstrate normal duodenal sweep with no duodenal fold thickening or stricture. No gross gastric or duodenal ulcer. Normal location of the gastrojejunal junction. IMPRESSION: 1. Diffusely prominently thickened gastric folds, most prominent in the proximal body of the stomach. This is a nonspecific finding with a broad differential including H pylori gastritis, Menetrier's disease or lymphoma. 2. Mild esophageal dysmotility. 3. Otherwise unremarkable limited water-soluble upper GI. 4. Nonobstructive bowel gas pattern on scout radiograph of the abdomen. Electronically Signed   By: Ilona Sorrel M.D.   On: 04/03/2016 09:25    Anti-infectives: Anti-infectives    Start     Dose/Rate Route Frequency Ordered Stop   04/02/16 0200  ampicillin-sulbactam (UNASYN) 1.5 g in sodium chloride 0.9 % 50 mL IVPB     1.5 g 100 mL/hr over 30 Minutes Intravenous Every 6 hours 04/01/16 2046     04/01/16 1200  ampicillin-sulbactam (UNASYN) 1.5 g in sodium chloride 0.9 % 50 mL IVPB  Status:  Discontinued     1.5 g 100 mL/hr over 30 Minutes Intravenous Every 6 hours 04/01/16 1049 04/01/16 2046   04/01/16 1000  ciprofloxacin (CIPRO) IVPB 400 mg  Status:  Discontinued     400 mg 200 mL/hr over 60 Minutes Intravenous Every 12 hours 03/31/16 2115 04/01/16 1049   03/31/16 2200  metroNIDAZOLE (FLAGYL) IVPB 500 mg  Status:  Discontinued     500 mg 100 mL/hr over 60 Minutes Intravenous Every 8 hours  03/31/16 2112 04/01/16 1049   03/31/16 2115  ciprofloxacin (CIPRO) IVPB 400 mg     400 mg 200 mL/hr over 60 Minutes Intravenous STAT 03/31/16 2114 04/01/16 0206       Assessment/Plan Constipation with dehydration versus partial large bowel obstruction - s/p diverting colostomy in 2013 by Dr. Johney Maine for a stercoral perforation - 3 weeks of intermittent nausea, vomiting, and increased ostomy output - WBC 9.0, afebrile today - parastomal hernia soft with no signs of obstruction on CT. Would not recommend any surgery.  N/v - GI has ordered UGI which showed: 1. Diffusely prominently thickened gastric folds, most prominent in the proximal body of the stomach. This is a nonspecific finding with a broad differential including H pylori gastritis, Menetrier's disease or lymphoma. 2. Mild esophageal dysmotility. 3. Otherwise unremarkable limited water-soluble upper GI. 4. Nonobstructive bowel gas pattern on scout radiograph of the abdomen.  Chronic narcotic dependence for chronic back pain Chronic constipation Legally blind Anxiety   ID - cipro/flagyl 3/6>>3/7, unasyn 3/7>> FEN - IVF, full liquids VTE - SCDs  Plan - Appreciate GI consult. Continue medical management. Will  away GI recommendations regarding UGI. Add carafate. Check H pylori.   LOS: 3 days    Jerrye Beavers , [facility] Surgery 04/03/2016, 11:28 AM Pager: [phone] Consults: [phone] Mon-Fri 7:00 am-4:30 pm Sat-Sun 7:00 am-11:30 am

## 2016-04-03 NOTE — Progress Notes (Signed)
  Physical Therapy Treatment Patient Details Name: [name] Bell MRN: [id number] DOB: Jun 01, 1945 Today's Date: 04/03/2016    History of Present Illness 71 yo female admitted with colitis. Hx of colectomy, duodenal ulcer, mac degeneration, chronic narcotics dependence, chronic back pain, legally blind.     PT Comments    Progressing slowly with mobility. Pt is still experiencing nausea-retching during session. Pt remains weak. Continue to recommend SNF.    Follow Up Recommendations  SNF     Equipment Recommendations  None recommended by PT    Recommendations for Other Services       Precautions / Restrictions Precautions Precautions: Fall Restrictions Weight Bearing Restrictions: No    Mobility  Bed Mobility Overal bed mobility: Needs Assistance Bed Mobility: Supine to Sit;Sit to Supine     Supine to sit: Min assist Sit to supine: Min assist   General bed mobility comments: Assist for trunk and LEs onto bed. Increased time.   Transfers Overall transfer level: Needs assistance Equipment used: Rolling walker (2 wheeled) Transfers: Sit to/from Stand Sit to Stand: Min assist         General transfer comment: Assist to rise, stabilize, control descent.   Ambulation/Gait Ambulation/Gait assistance: Min assist Ambulation Distance (Feet): 15 Feet (x2) Assistive device:  (IV pole) Gait Pattern/deviations: Step-through pattern;Decreased stride length     General Gait Details: Assist to stabilize pt throughout ambulation distance. Pt fatigues easily/quickly. Walked to and from bathroom. Pt still unable to tolerate hallway ambulation on today.   Stairs            Wheelchair Mobility    Modified Rankin (Stroke Patients Only)       Balance Overall balance assessment: Needs assistance           Standing balance-Leahy Scale: Fair                      Cognition Arousal/Alertness: Awake/alert Behavior During Therapy: WFL for tasks  assessed/performed Overall Cognitive Status: Within Functional Limits for tasks assessed                      Exercises      General Comments        Pertinent Vitals/Pain Pain Assessment: No/denies pain    Home Living                      Prior Function            PT Goals (current goals can now be found in the care plan section) Progress towards PT goals: Progressing toward goals (slowly)    Frequency    Min 3X/week      PT Plan Current plan remains appropriate    Co-evaluation             End of Session   Activity Tolerance: Patient limited by fatigue Patient left: in bed;with call bell/phone within reach   PT Visit Diagnosis: Muscle weakness (generalized) (M62.81);Difficulty in walking, not elsewhere classified (R26.2)     Time: 7048-8891 PT Time Calculation (min) (ACUTE ONLY): 17 min  Charges:  $Gait Training: 8-22 mins                    G Codes:       Weston Anna, MPT Pager: [phone]

## 2016-04-03 NOTE — Progress Notes (Signed)
PT Cancellation Note  Patient Details Name: [name] Bell MRN: [id number] DOB: 1945/02/25   Cancelled Treatment:    Reason Eval/Treat Not Completed: Patient at procedure or test/unavailable. Will check back as schedule allows. Thanks.    Weston Anna, MPT Pager: [phone]

## 2016-04-03 NOTE — Progress Notes (Signed)
[facility] PROGRESS NOTE Subjective patient had no vomiting overnight but is complaining of abdominal pain feels like she needs pain medication. She has been on chronic pain medications  Objective: Vital signs in last 24 hours: Temp:  [98.4 F (36.9 C)-99.5 F (37.5 C)] 98.4 F (36.9 C) (03/09 0647) Pulse Rate:  [86-99] 95 (03/09 0647) Resp:  [18] 18 (03/09 0647) BP: (99-157)/(44-64) 118/51 (03/09 0647) SpO2:  [95 %-96 %] 95 % (03/09 0647) Last BM Date: 04/02/16  Intake/Output from previous day: 03/08 0701 - 03/09 0700 In: 990 [P.O.:840; IV Piggyback:150] Out: 904 [Urine:903; Stool:1] Intake/Output this shift: No intake/output data recorded.    Lab Results:  Recent Labs  03/31/16 1618 04/01/16 0653 04/02/16 0625 04/03/16 0606  WBC 8.4 9.8 9.0 9.8  HGB 15.5* 16.7* 13.6 14.0  HCT 44.9 47.0* 40.5 40.6  PLT 236 206 226 205   BMET  Recent Labs  03/31/16 1618 04/01/16 0653 04/02/16 0625 04/03/16 0606  NA 135 139 140 139  K 2.9* 3.7 4.4 3.7  CL 91* 103 111 107  CO2 34* 29 23 27   CREATININE 0.58 0.62 0.45 0.41*   LFT  Recent Labs  03/31/16 1618 04/03/16 0606  PROT 5.2* 4.4*  AST 51* 42*  ALT 21 19  ALKPHOS 114 93  BILITOT 0.9 0.7   PT/INR No results for input(s): LABPROT, INR in the last 72 hours. PANCREAS  Recent Labs  03/31/16 1618  LIPASE 17         Studies/Results: Dg Chest Port 1 View  Result Date: 04/02/2016 CLINICAL DATA:  Generalized weakness, cough. EXAM: PORTABLE CHEST 1 VIEW COMPARISON:  Radiographs of March 17, 2016. FINDINGS: The heart size and mediastinal contours are within normal limits. Both lungs are clear. Atherosclerosis of thoracic aorta is noted. No pneumothorax or pleural effusion is noted. The visualized skeletal structures are unremarkable. IMPRESSION: No acute cardiopulmonary abnormality seen.  Aortic atherosclerosis. Electronically Signed   By: Marijo Conception, M.D.   On: 04/02/2016 16:25     Medications: I have reviewed the patient's current medications.  Assessment/Plan: 1. Nausea and vomiting. Upper G.I. later today to evaluate for gastric outlet obstruction. Given the large amount of narcotics that she has been using this could all be due to delayed emptying. We will follow the results of the upper G.I. series.   Marcedes Tech JR,Deltha Bernales L 04/03/2016, 7:29 AM  This note was created using voice recognition software. Minor errors may Have occurred unintentionally.  Pager: [phone] If no answer or after hours call [phone]

## 2016-04-03 NOTE — Care Management Important Message (Signed)
Important Message  Patient Details  Name: [name] Bell MRN: [id number] Date of Birth: [birth year]   Medicare Important Message Given:  Yes    Purcell Mouton, RN 04/03/2016, 3:02 PM

## 2016-04-03 NOTE — Progress Notes (Signed)
PROGRESS NOTE  [name] Bell LYY:[id number] DOB: 1945/12/22 DOA: 03/31/2016 PCP: Albin Felling, MD  HPI/Recap of past 24 hours:  Intermittent n/v Some liquid output from colostomy Report chronic productive cough, no sob, on room air Report significant weight loss  Assessment/Plan: Principal Problem:   Colitis Active Problems:   Colostomy in place   Small bowel obstruction   Nausea and vomiting   Hypokalemia  Acute colitis with possible bowel obstruction, history of colostomy -- MS contin by mouth continued since she has opioid dependence. --IV cipro and flagyl initially, changed to iv unasyn --she received NS @ 125cc/hr since admission, ivf stopped on 3/8 --general surgery following, no indication for surgery, but persistent n/v, not able to tolerate diet advancement, general surgery recommended gi consult  Persistent N/V, not able to tolerate diet advancement: (per chart review patient has a history of gi outlet obstruction required EGD dilation in 2009) -poor oral intake, start gentle hydration on 3/9 -[facility]  History of bleeding duodenal ulcer, question of coffee ground emesis --Follow H/H --IV PPI q12h for now  Hypokalemia --Potassium replacement  Abnormal U/A without LUTS --Culture no growth --already on abx   NSVT: 5beats around 9am on 3/7, 6beats of svt on 3/7 9pm,  bp borderline low, home meds coreg resumed,  Echocardiogram exam is limited but lvef wnl. , keep k>4, mag>2 tsh 2.3    Chronic productive cough, prior smoker, cxr on 2/20 with chronic bronchitis changes, currently on room air, no respiratory distress, repeat chest x ray, again no acute findings, start on mucinex, already on abx   severe (moderate) malnutrition in context of chronic illness Body mass index is 19.89 kg/m. Nutrition input appreciated  Macular degeneration: poor vision, fall precaution,   DVT prophylaxis: SCDs Code Status: FULL Family Communication: Patient  alone , report no immediate family Disposition Plan: SNF Consults called:  General Surgery  [facility]  Procedures:  none  Antibiotics:  Cipro/flagyl  unasyn   Objective: BP (!) 101/41 (BP Location: Left Arm)   Pulse 87   Temp 98.5 F (36.9 C) (Oral)   Resp 16   Ht 5\' 4"  (1.626 m)   Wt 52.6 kg (115 lb 14.4 oz)   LMP 08/25/1980   SpO2 95%   BMI 19.89 kg/m   Intake/Output Summary (Last 24 hours) at 04/03/16 1532 Last data filed at 04/03/16 1000  Gross per 24 hour  Intake              940 ml  Output              804 ml  Net              136 ml   Filed Weights   03/31/16 2113 03/31/16 2242  Weight: 55 kg (121 lb 4.1 oz) 52.6 kg (115 lb 14.4 oz)    Exam:   General:  Frail, legally blind at baseline for years  Cardiovascular: RRR  Respiratory: CTABL  Abdomen: Soft/ND/NT, positive BS, Ostomy to LLQ.  Liquid brown stool in the bag.  Musculoskeletal: No Edema  Neuro: aaox3  Data Reviewed: Basic Metabolic Panel:  Recent Labs Lab 03/31/16 1618 04/01/16 0653 04/01/16 1108 04/02/16 0625 04/03/16 0606  NA 135 139  --  140 139  K 2.9* 3.7  --  4.4 3.7  CL 91* 103  --  111 107  CO2 34* 29  --  23 27  GLUCOSE 109* 90  --  98 97  BUN 10  7  --  <5* <5*  CREATININE 0.58 0.62  --  0.45 0.41*  CALCIUM 8.7* 7.5*  --  6.8* 7.1*  MG 1.8  --  2.0  --   --    Liver Function Tests:  Recent Labs Lab 03/31/16 1618 04/03/16 0606  AST 51* 42*  ALT 21 19  ALKPHOS 114 93  BILITOT 0.9 0.7  PROT 5.2* 4.4*  ALBUMIN 2.3* 1.9*    Recent Labs Lab 03/31/16 1618  LIPASE 17   No results for input(s): AMMONIA in the last 168 hours. CBC:  Recent Labs Lab 03/31/16 1618 04/01/16 0653 04/02/16 0625 04/03/16 0606  WBC 8.4 9.8 9.0 9.8  NEUTROABS 4.9  --  5.9 5.5  HGB 15.5* 16.7* 13.6 14.0  HCT 44.9 47.0* 40.5 40.6  MCV 90.3 91.6 92.0 92.1  PLT 236 206 226 205   Cardiac Enzymes:   No results for input(s): CKTOTAL, CKMB, CKMBINDEX, TROPONINI in the last 168  hours. BNP (last 3 results) No results for input(s): BNP in the last 8760 hours.  ProBNP (last 3 results) No results for input(s): PROBNP in the last 8760 hours.  CBG: No results for input(s): GLUCAP in the last 168 hours.  Recent Results (from the past 240 hour(s))  Urine culture     Status: Abnormal   Collection Time: 03/31/16  4:00 PM  Result Value Ref Range Status   Specimen Description URINE, CLEAN CATCH  Final   Special Requests NONE  Final   Culture MULTIPLE SPECIES PRESENT, SUGGEST RECOLLECTION (A)  Final   Report Status 04/02/2016 FINAL  Final  MRSA culture     Status: None   Collection Time: 03/31/16 11:38 PM  Result Value Ref Range Status   Specimen Description NOSE  Final   Special Requests NONE  Final   Culture   Final    NO MRSA DETECTED Performed at [facility], [address] [place],  [address]    Report Status 04/01/2016 FINAL  Final  MRSA PCR Screening     Status: Abnormal   Collection Time: 03/31/16 11:39 PM  Result Value Ref Range Status   MRSA by PCR INVALID RESULTS, SPECIMEN SENT FOR CULTURE (A) NEGATIVE Final    Comment:        The GeneXpert MRSA Assay (FDA approved for NASAL specimens only), is one component of a comprehensive MRSA colonization surveillance program. It is not intended to diagnose MRSA infection nor to guide or monitor treatment for MRSA infections. RESULT CALLED TO, READ BACK BY AND VERIFIED WITH: M STEFFENS RN @ [phone] ON 04/01/16 BY C DAVIS      Studies: Dg Chest Port 1 View  Result Date: 04/02/2016 CLINICAL DATA:  Generalized weakness, cough. EXAM: PORTABLE CHEST 1 VIEW COMPARISON:  Radiographs of March 17, 2016. FINDINGS: The heart size and mediastinal contours are within normal limits. Both lungs are clear. Atherosclerosis of thoracic aorta is noted. No pneumothorax or pleural effusion is noted. The visualized skeletal structures are unremarkable. IMPRESSION: No acute cardiopulmonary abnormality seen.   Aortic atherosclerosis. Electronically Signed   By: Marijo Conception, M.D.   On: 04/02/2016 16:25   Dg Duanne Limerick W/water Sol Cm  Result Date: 04/03/2016 CLINICAL DATA:  71 year old female inpatient with a history of left hemicolectomy with end colostomy complicated by parastomal hernia containing the distal transverse colon, admitted with nausea and vomiting. EXAM: WATER SOLUBLE UPPER GI SERIES TECHNIQUE: Single-column upper GI series was performed using water soluble contrast. CONTRAST:  80 cc enterovue 300 PO COMPARISON:  03/31/2016 CT abdomen/pelvis. FLUOROSCOPY TIME:  Fluoroscopy Time:  2 minutes 36 seconds Radiation Exposure Index (if provided by the fluoroscopic device): 20.5 mGy Number of Acquired Spot Images: 3 FINDINGS: No dilated small bowel loops on the scout radiograph. Mild stool and gas throughout the colon. No evidence of pneumatosis or pneumoperitoneum. No pathologic soft tissue calcifications. Upper GI is significantly limited by patient's generalized weakness and limited mobility. In particular, patient unable to tolerate supine or prone positioning and unable to stand upright. Examination performed in partial elevation. Esophageal distensibility appears grossly normal. Mild esophageal dysmotility characterized by proximal escape of the barium bolus and mild intermittent weakening of primary peristalsis in the mid to lower thoracic esophagus. No hiatal hernia. No gross esophageal mass, stricture or ulcer. Unable to assess for gastroesophageal reflux given mobility limitations. There are diffusely prominently thickened folds throughout the stomach, most prominent in the proximal body of the stomach. No discrete gastric mass. Normal gastric emptying. Limited views of the duodenum demonstrate normal duodenal sweep with no duodenal fold thickening or stricture. No gross gastric or duodenal ulcer. Normal location of the gastrojejunal junction. IMPRESSION: 1. Diffusely prominently thickened gastric folds,  most prominent in the proximal body of the stomach. This is a nonspecific finding with a broad differential including H pylori gastritis, Menetrier's disease or lymphoma. 2. Mild esophageal dysmotility. 3. Otherwise unremarkable limited water-soluble upper GI. 4. Nonobstructive bowel gas pattern on scout radiograph of the abdomen. Electronically Signed   By: Ilona Sorrel M.D.   On: 04/03/2016 09:25    Scheduled Meds: . ampicillin-sulbactam (UNASYN) IV  1.5 g Intravenous Q6H  . carvedilol  3.125 mg Oral BID WC  . chlorhexidine  15 mL Mouth Rinse BID  . feeding supplement  1 Container Oral BID BM  . feeding supplement (PRO-STAT SUGAR FREE 64)  30 mL Oral BID  . guaiFENesin  600 mg Oral BID  . iopamidol      . mouth rinse  15 mL Mouth Rinse q12n4p  . morphine  30 mg Oral Q12H  . pantoprazole (PROTONIX) IV  40 mg Intravenous Q12H  . sodium chloride flush  3 mL Intravenous Q12H  . sucralfate  1 g Oral TID WC & HS    Continuous Infusions:    Time spent: 72mins  Nalany Steedley MD, PhD  [facility] Pager [phone]. If 7PM-7AM, please contact night-coverage at [email or web address], password [facility] 04/03/2016, 3:32 PM  LOS: 3 days

## 2016-04-04 DIAGNOSIS — E440 Moderate protein-calorie malnutrition: Secondary | ICD-10-CM | POA: Insufficient documentation

## 2016-04-04 LAB — COMPREHENSIVE METABOLIC PANEL
ALBUMIN: 1.8 g/dL — AB (ref 3.5–5.0)
ALK PHOS: 88 U/L (ref 38–126)
ALT: 19 U/L (ref 14–54)
ANION GAP: 5 (ref 5–15)
AST: 40 U/L (ref 15–41)
BUN: <5 mg/dL — ABNORMAL LOW (ref 6–20)
CHLORIDE: 106 mmol/L (ref 101–111)
CO2: 27 mmol/L (ref 22–32)
Calcium: 7.1 mg/dL — ABNORMAL LOW (ref 8.9–10.3)
Creatinine, Ser: 0.47 mg/dL (ref 0.44–1.00)
GFR calc Af Amer: >60 mL/min (ref >60)
GFR calc non Af Amer: >60 mL/min (ref >60)
GLUCOSE: 123 mg/dL — AB (ref 65–99)
POTASSIUM: 2.9 mmol/L — AB (ref 3.5–5.1)
Sodium: 138 mmol/L (ref 135–145)
Total Bilirubin: 0.6 mg/dL (ref 0.3–1.2)
Total Protein: 4.2 g/dL — ABNORMAL LOW (ref 6.5–8.1)

## 2016-04-04 LAB — PROTIME-INR
INR: 1.07
Prothrombin Time: 13.9 seconds (ref 11.4–15.2)

## 2016-04-04 LAB — MAGNESIUM: Magnesium: 1.8 mg/dL (ref 1.7–2.4)

## 2016-04-04 MED ORDER — MAGNESIUM SULFATE 2 GM/50ML IV SOLN
2.0000 g | Freq: Once | INTRAVENOUS | Status: AC
  Administered 2016-04-04: 2 g via INTRAVENOUS
  Filled 2016-04-04: qty 50

## 2016-04-04 MED ORDER — METOPROLOL TARTRATE 25 MG PO TABS
12.5000 mg | ORAL_TABLET | Freq: Two times a day (BID) | ORAL | Status: DC
  Administered 2016-04-04 – 2016-04-07 (×6): 12.5 mg via ORAL
  Filled 2016-04-04 (×6): qty 1

## 2016-04-04 MED ORDER — POTASSIUM CHLORIDE 10 MEQ/100ML IV SOLN
10.0000 meq | INTRAVENOUS | Status: AC
  Administered 2016-04-04 (×3): 10 meq via INTRAVENOUS
  Filled 2016-04-04 (×3): qty 100

## 2016-04-04 MED ORDER — SODIUM CHLORIDE 0.9 % IV BOLUS (SEPSIS)
1000.0000 mL | Freq: Once | INTRAVENOUS | Status: AC
  Administered 2016-04-04: 1000 mL via INTRAVENOUS

## 2016-04-04 MED ORDER — SODIUM CHLORIDE 0.9 % IV SOLN: 30.0000 meq | INTRAVENOUS | Status: DC

## 2016-04-04 NOTE — Progress Notes (Addendum)
PROGRESS NOTE  [name] Bell YQM:[id number] DOB: 23-Oct-1945 DOA: 03/31/2016 PCP: Albin Felling, MD  HPI/Recap of past 24 hours:  Feeling better, no more vomiting, report less cough as well Some liquid output from colostomy Report  cough, no sob, on room air Report significant weight loss  Assessment/Plan: Principal Problem:   Colitis Active Problems:   Colostomy in place   Small bowel obstruction   Nausea and vomiting   Hypokalemia   Malnutrition of moderate degree  Acute colitis with possible bowel obstruction, history of colostomy -- MS contin by mouth continued since she has opioid dependence. --IV cipro and flagyl initially, changed to iv unasyn, plan to discharge on augmentin to finish total of 7 days, abx day 4 as of 3/10 --she received NS @ 125cc/hr since admission, ivf stopped on 3/8, restart ivf on 3/9 due to remain poor oral intake, low normal bp. --general surgery following, no indication for surgery,   Persistent N/V,  (per chart review patient has a history of gi outlet obstruction required EGD dilation in 2009) -poor oral intake, start gentle hydration on 3/9 -[facility] consulted, upper gi serious "1. Diffusely prominently thickened gastric folds, most prominent in the proximal body of the stomach. This is a nonspecific finding with a broad differential including H pylori gastritis, Menetrier's disease or lymphoma. 2. Mild esophageal dysmotility. 3. Otherwise unremarkable limited water-soluble upper GI. 4. Nonobstructive bowel gas pattern on scout radiograph of the Abdomen. -she has been on protoninc bid since admission, sulcralfate added on 3/9 -will follow GI recommendation  History of bleeding duodenal ulcer, question of coffee ground emesis --Follow H/H --IV PPI q12h for now  Hypokalemia --Potassium replacement  Abnormal U/A without LUTS --Culture no growth --already on abx   NSVT: 5beats around 9am on 3/7, 6beats of svt on 3/7 9pm,  bp  borderline low, home meds coreg resumed,  Echocardiogram exam is limited but lvef wnl. , keep k>4, mag>2 tsh 2.3    Chronic productive cough, prior smoker, cxr on 2/20 with chronic bronchitis changes, currently on room air, no respiratory distress, repeat chest x ray, again no acute findings, start on mucinex, already on abx Report cough has almost resolved   severe (moderate) malnutrition in context of chronic illness Body mass index is 19.89 kg/m. Nutrition input appreciated  Macular degeneration: poor vision, fall precaution,   DVT prophylaxis: SCDs Code Status: FULL Family Communication: Patient alone , report no immediate family Disposition Plan: SNF, now she feels stronger, wonder if she could go home, awaiting PT reeval, will need Gi and general surgery for discharge clearance Consults called:  General Surgery  [facility]  Procedures:  none  Antibiotics:  Cipro/flagyl  unasyn   Objective: BP (!) 100/42 (BP Location: Left Arm)   Pulse 87   Temp 98.3 F (36.8 C) (Oral)   Resp 16   Ht 5\' 4"  (1.626 m)   Wt 52.6 kg (115 lb 14.4 oz)   LMP 08/25/1980   SpO2 97%   BMI 19.89 kg/m   Intake/Output Summary (Last 24 hours) at 04/04/16 0754 Last data filed at 04/04/16 0700  Gross per 24 hour  Intake             2240 ml  Output             2400 ml  Net             -160 ml   Filed Weights   03/31/16 2113 03/31/16 2242  Weight: 55  kg (121 lb 4.1 oz) 52.6 kg (115 lb 14.4 oz)    Exam:   General:  Frail, legally blind at baseline for years  Cardiovascular: RRR  Respiratory: CTABL  Abdomen: Soft/ND/NT, positive BS, Ostomy to LLQ.  Liquid brown stool in the bag.  Musculoskeletal: No Edema  Neuro: aaox3  Data Reviewed: Basic Metabolic Panel:  Recent Labs Lab 03/31/16 1618 04/01/16 0653 04/01/16 1108 04/02/16 0625 04/03/16 0606 04/04/16 0547  NA 135 139  --  140 139 138  K 2.9* 3.7  --  4.4 3.7 2.9*  CL 91* 103  --  111 107 106  CO2 34* 29  --   23 27 27   GLUCOSE 109* 90  --  98 97 123*  BUN 10 7  --  <5* <5* <5*  CREATININE 0.58 0.62  --  0.45 0.41* 0.47  CALCIUM 8.7* 7.5*  --  6.8* 7.1* 7.1*  MG 1.8  --  2.0  --   --  1.8   Liver Function Tests:  Recent Labs Lab 03/31/16 1618 04/03/16 0606 04/04/16 0547  AST 51* 42* 40  ALT 21 19 19   ALKPHOS 114 93 88  BILITOT 0.9 0.7 0.6  PROT 5.2* 4.4* 4.2*  ALBUMIN 2.3* 1.9* 1.8*    Recent Labs Lab 03/31/16 1618  LIPASE 17   No results for input(s): AMMONIA in the last 168 hours. CBC:  Recent Labs Lab 03/31/16 1618 04/01/16 0653 04/02/16 0625 04/03/16 0606  WBC 8.4 9.8 9.0 9.8  NEUTROABS 4.9  --  5.9 5.5  HGB 15.5* 16.7* 13.6 14.0  HCT 44.9 47.0* 40.5 40.6  MCV 90.3 91.6 92.0 92.1  PLT 236 206 226 205   Cardiac Enzymes:   No results for input(s): CKTOTAL, CKMB, CKMBINDEX, TROPONINI in the last 168 hours. BNP (last 3 results) No results for input(s): BNP in the last 8760 hours.  ProBNP (last 3 results) No results for input(s): PROBNP in the last 8760 hours.  CBG: No results for input(s): GLUCAP in the last 168 hours.  Recent Results (from the past 240 hour(s))  Urine culture     Status: Abnormal   Collection Time: 03/31/16  4:00 PM  Result Value Ref Range Status   Specimen Description URINE, CLEAN CATCH  Final   Special Requests NONE  Final   Culture MULTIPLE SPECIES PRESENT, SUGGEST RECOLLECTION (A)  Final   Report Status 04/02/2016 FINAL  Final  MRSA culture     Status: None   Collection Time: 03/31/16 11:38 PM  Result Value Ref Range Status   Specimen Description NOSE  Final   Special Requests NONE  Final   Culture   Final    NO MRSA DETECTED Performed at [facility], [address] [place], [place] [address]    Report Status 04/01/2016 FINAL  Final  MRSA PCR Screening     Status: Abnormal   Collection Time: 03/31/16 11:39 PM  Result Value Ref Range Status   MRSA by PCR INVALID RESULTS, SPECIMEN SENT FOR CULTURE (A) NEGATIVE Final     Comment:        The GeneXpert MRSA Assay (FDA approved for NASAL specimens only), is one component of a comprehensive MRSA colonization surveillance program. It is not intended to diagnose MRSA infection nor to guide or monitor treatment for MRSA infections. RESULT CALLED TO, READ BACK BY AND VERIFIED WITH: M STEFFENS RN @ [phone] ON 04/01/16 BY C DAVIS      Studies: Dg Ugi W/water  Sol Cm  Result Date: 04/03/2016 CLINICAL DATA:  71 year old female inpatient with a history of left hemicolectomy with end colostomy complicated by parastomal hernia containing the distal transverse colon, admitted with nausea and vomiting. EXAM: WATER SOLUBLE UPPER GI SERIES TECHNIQUE: Single-column upper GI series was performed using water soluble contrast. CONTRAST:  80 cc enterovue 300 PO COMPARISON:  03/31/2016 CT abdomen/pelvis. FLUOROSCOPY TIME:  Fluoroscopy Time:  2 minutes 36 seconds Radiation Exposure Index (if provided by the fluoroscopic device): 20.5 mGy Number of Acquired Spot Images: 3 FINDINGS: No dilated small bowel loops on the scout radiograph. Mild stool and gas throughout the colon. No evidence of pneumatosis or pneumoperitoneum. No pathologic soft tissue calcifications. Upper GI is significantly limited by patient's generalized weakness and limited mobility. In particular, patient unable to tolerate supine or prone positioning and unable to stand upright. Examination performed in partial elevation. Esophageal distensibility appears grossly normal. Mild esophageal dysmotility characterized by proximal escape of the barium bolus and mild intermittent weakening of primary peristalsis in the mid to lower thoracic esophagus. No hiatal hernia. No gross esophageal mass, stricture or ulcer. Unable to assess for gastroesophageal reflux given mobility limitations. There are diffusely prominently thickened folds throughout the stomach, most prominent in the proximal body of the stomach. No discrete gastric mass.  Normal gastric emptying. Limited views of the duodenum demonstrate normal duodenal sweep with no duodenal fold thickening or stricture. No gross gastric or duodenal ulcer. Normal location of the gastrojejunal junction. IMPRESSION: 1. Diffusely prominently thickened gastric folds, most prominent in the proximal body of the stomach. This is a nonspecific finding with a broad differential including H pylori gastritis, Menetrier's disease or lymphoma. 2. Mild esophageal dysmotility. 3. Otherwise unremarkable limited water-soluble upper GI. 4. Nonobstructive bowel gas pattern on scout radiograph of the abdomen. Electronically Signed   By: Ilona Sorrel M.D.   On: 04/03/2016 09:25    Scheduled Meds: . ampicillin-sulbactam (UNASYN) IV  1.5 g Intravenous Q6H  . carvedilol  3.125 mg Oral BID WC  . chlorhexidine  15 mL Mouth Rinse BID  . feeding supplement  1 Container Oral BID BM  . feeding supplement (PRO-STAT SUGAR FREE 64)  30 mL Oral BID  . guaiFENesin  600 mg Oral BID  . magnesium sulfate 1 - 4 g bolus IVPB  2 g Intravenous Once  . mouth rinse  15 mL Mouth Rinse q12n4p  . morphine  30 mg Oral Q12H  . pantoprazole (PROTONIX) IV  40 mg Intravenous Q12H  . potassium chloride (KCL MULTIRUN) 30 mEq in 265 mL IVPB  30 mEq Intravenous Q4H  . sodium chloride flush  3 mL Intravenous Q12H  . sucralfate  1 g Oral TID WC & HS    Continuous Infusions: . sodium chloride 75 mL/hr at 04/04/16 2025     Time spent: 36mins  Jessicamarie Amiri MD, PhD  [facility] Pager [phone]. If 7PM-7AM, please contact night-coverage at [email or web address], password [facility] 04/04/2016, 7:54 AM  LOS: 4 days

## 2016-04-04 NOTE — Progress Notes (Signed)
CSW following for discharge to [facility] when medically ready, CSW received message from Dr. Erlinda Hong that patient is considering possibly returning home at discharge as well - awaiting PT re-eval. CSW has completed FL2 & will continue to follow and assist with discharge when ready.    Raynaldo Opitz, [facility] Clinical Social Worker cell #: [phone]

## 2016-04-04 NOTE — Progress Notes (Signed)
[facility] Progress Note  Subjective: Patient states she is miraculously better today which she attributes to adding Carafate yesterday. Her barium swallow was somewhat suboptimal particularly in the stomach but no obvious stricture ulcer or obstruction to flow of barium. She tells me she had what sounds like a pyloric dilatation by Dr. Watt Climes about 9 years ago.  Objective: Vital signs in last 24 hours: Temp:  [98.3 F (36.8 Bell)] 98.3 F (36.8 Bell) (03/10 0421) Pulse Rate:  [78-87] 87 (03/10 0421) Resp:  [16-18] 16 (03/10 0421) BP: (100-102)/(42-45) 100/42 (03/10 0421) SpO2:  [97 %] 97 % (03/10 0421) Weight change:    PE: Unchanged  Lab Results: Results for orders placed or performed during the hospital encounter of 03/31/16 (from the past 24 hour(s))  Comprehensive metabolic panel     Status: Abnormal   Collection Time: 04/04/16  5:47 AM  Result Value Ref Range   Sodium 138 135 - 145 mmol/L   Potassium 2.9 (L) 3.5 - 5.1 mmol/L   Chloride 106 101 - 111 mmol/L   CO2 27 22 - 32 mmol/L   Glucose, Bld 123 (H) 65 - 99 mg/dL   BUN <5 (L) 6 - 20 mg/dL   Creatinine, Ser 0.47 0.44 - 1.00 mg/dL   Calcium 7.1 (L) 8.9 - 10.3 mg/dL   Total Protein 4.2 (L) 6.5 - 8.1 g/dL   Albumin 1.8 (L) 3.5 - 5.0 g/dL   AST 40 15 - 41 U/L   ALT 19 14 - 54 U/L   Alkaline Phosphatase 88 38 - 126 U/L   Total Bilirubin 0.6 0.3 - 1.2 mg/dL   GFR calc non Af Amer >60 >60 mL/min   GFR calc Af Amer >60 >60 mL/min   Anion gap 5 5 - 15  Magnesium     Status: None   Collection Time: 04/04/16  5:47 AM  Result Value Ref Range   Magnesium 1.8 1.7 - 2.4 mg/dL  Protime-INR     Status: None   Collection Time: 04/04/16  5:47 AM  Result Value Ref Range   Prothrombin Time 13.9 11.4 - 15.2 seconds   INR 1.07     Studies/Results: Dg Chest Port 1 View  Result Date: 04/02/2016 CLINICAL DATA:  Generalized weakness, cough. EXAM: PORTABLE CHEST 1 VIEW COMPARISON:  Radiographs of March 17, 2016. FINDINGS:  The heart size and mediastinal contours are within normal limits. Both lungs are clear. Atherosclerosis of thoracic aorta is noted. No pneumothorax or pleural effusion is noted. The visualized skeletal structures are unremarkable. IMPRESSION: No acute cardiopulmonary abnormality seen.  Aortic atherosclerosis. Electronically Signed   By: Marijo Conception, M.D.   On: 04/02/2016 16:25   Dg Duanne Limerick W/water Sol Cm  Result Date: 04/03/2016 CLINICAL DATA:  71 year old female inpatient with a history of left hemicolectomy with end colostomy complicated by parastomal hernia containing the distal transverse colon, admitted with nausea and vomiting. EXAM: WATER SOLUBLE UPPER GI SERIES TECHNIQUE: Single-column upper GI series was performed using water soluble contrast. CONTRAST:  80 cc enterovue 300 PO COMPARISON:  03/31/2016 CT abdomen/pelvis. FLUOROSCOPY TIME:  Fluoroscopy Time:  2 minutes 36 seconds Radiation Exposure Index (if provided by the fluoroscopic device): 20.5 mGy Number of Acquired Spot Images: 3 FINDINGS: No dilated small bowel loops on the scout radiograph. Mild stool and gas throughout the colon. No evidence of pneumatosis or pneumoperitoneum. No pathologic soft tissue calcifications. Upper GI is significantly limited by patient's generalized weakness and limited mobility. In particular, patient unable to  tolerate supine or prone positioning and unable to stand upright. Examination performed in partial elevation. Esophageal distensibility appears grossly normal. Mild esophageal dysmotility characterized by proximal escape of the barium bolus and mild intermittent weakening of primary peristalsis in the mid to lower thoracic esophagus. No hiatal hernia. No gross esophageal mass, stricture or ulcer. Unable to assess for gastroesophageal reflux given mobility limitations. There are diffusely prominently thickened folds throughout the stomach, most prominent in the proximal body of the stomach. No discrete gastric  mass. Normal gastric emptying. Limited views of the duodenum demonstrate normal duodenal sweep with no duodenal fold thickening or stricture. No gross gastric or duodenal ulcer. Normal location of the gastrojejunal junction. IMPRESSION: 1. Diffusely prominently thickened gastric folds, most prominent in the proximal body of the stomach. This is a nonspecific finding with a broad differential including H pylori gastritis, Menetrier's disease or lymphoma. 2. Mild esophageal dysmotility. 3. Otherwise unremarkable limited water-soluble upper GI. 4. Nonobstructive bowel gas pattern on scout radiograph of the abdomen. Electronically Signed   By: Ilona Sorrel M.D.   On: 04/03/2016 09:25      Assessment: Nausea vomiting unclear whether due to a subtle or partial pyloric outlet stricture, narcotic induced gastroparesis or other factors. Barium swallow shows diffuse prominent thickened gastric folds mainly in the body with differential diagnosis as above. Plan: Given her previous pyloric stenosis and the barium findings, duration of symptoms and weight loss I offered EGD tomorrow but she would like to hold off and is pleased with her progress and was to see how long it will last. I think she will need EGD before leaving the hospital. She was already on a PPI and we'll continue this and Carafate. We'll continue to follow.  [name] Bell 04/04/2016, 3:08 PM  Pager [phone] If no answer or after 5 PM call [phone]

## 2016-04-04 NOTE — Progress Notes (Signed)
Subjective: Feels much better with the carafate and protonix. Able to drink but can't do dairy  Objective: Vital signs in last 24 hours: Temp:  [98.3 F (36.8 C)-98.5 F (36.9 C)] 98.3 F (36.8 C) (03/10 0421) Pulse Rate:  [78-87] 87 (03/10 0421) Resp:  [16-18] 16 (03/10 0421) BP: (100-102)/(41-45) 100/42 (03/10 0421) SpO2:  [95 %-97 %] 97 % (03/10 0421) Last BM Date: 04/03/16  Intake/Output from previous day: 03/09 0701 - 03/10 0700 In: 2240 [P.O.:1020; I.V.:1020; IV Piggyback:200] Out: 2400 [Urine:2250; Stool:150] Intake/Output this shift: Total I/O In: 240 [P.O.:240] Out: 600 [Urine:500; Stool:100]  Resp: clear to auscultation bilaterally Cardio: regular rate and rhythm GI: soft, nontender. ostomy productive  Lab Results:   Recent Labs  04/02/16 0625 04/03/16 0606  WBC 9.0 9.8  HGB 13.6 14.0  HCT 40.5 40.6  PLT 226 205   BMET  Recent Labs  04/03/16 0606 04/04/16 0547  NA 139 138  K 3.7 2.9*  CL 107 106  CO2 27 27  GLUCOSE 97 123*  BUN <5* <5*  CREATININE 0.41* 0.47  CALCIUM 7.1* 7.1*   PT/INR  Recent Labs  04/04/16 0547  LABPROT 13.9  INR 1.07   ABG No results for input(s): PHART, HCO3 in the last 72 hours.  Invalid input(s): PCO2, PO2  Studies/Results: Dg Chest Port 1 View  Result Date: 04/02/2016 CLINICAL DATA:  Generalized weakness, cough. EXAM: PORTABLE CHEST 1 VIEW COMPARISON:  Radiographs of March 17, 2016. FINDINGS: The heart size and mediastinal contours are within normal limits. Both lungs are clear. Atherosclerosis of thoracic aorta is noted. No pneumothorax or pleural effusion is noted. The visualized skeletal structures are unremarkable. IMPRESSION: No acute cardiopulmonary abnormality seen.  Aortic atherosclerosis. Electronically Signed   By: Marijo Conception, M.D.   On: 04/02/2016 16:25   Dg Duanne Limerick W/water Sol Cm  Result Date: 04/03/2016 CLINICAL DATA:  71 year old female inpatient with a history of left hemicolectomy with  end colostomy complicated by parastomal hernia containing the distal transverse colon, admitted with nausea and vomiting. EXAM: WATER SOLUBLE UPPER GI SERIES TECHNIQUE: Single-column upper GI series was performed using water soluble contrast. CONTRAST:  80 cc enterovue 300 PO COMPARISON:  03/31/2016 CT abdomen/pelvis. FLUOROSCOPY TIME:  Fluoroscopy Time:  2 minutes 36 seconds Radiation Exposure Index (if provided by the fluoroscopic device): 20.5 mGy Number of Acquired Spot Images: 3 FINDINGS: No dilated small bowel loops on the scout radiograph. Mild stool and gas throughout the colon. No evidence of pneumatosis or pneumoperitoneum. No pathologic soft tissue calcifications. Upper GI is significantly limited by patient's generalized weakness and limited mobility. In particular, patient unable to tolerate supine or prone positioning and unable to stand upright. Examination performed in partial elevation. Esophageal distensibility appears grossly normal. Mild esophageal dysmotility characterized by proximal escape of the barium bolus and mild intermittent weakening of primary peristalsis in the mid to lower thoracic esophagus. No hiatal hernia. No gross esophageal mass, stricture or ulcer. Unable to assess for gastroesophageal reflux given mobility limitations. There are diffusely prominently thickened folds throughout the stomach, most prominent in the proximal body of the stomach. No discrete gastric mass. Normal gastric emptying. Limited views of the duodenum demonstrate normal duodenal sweep with no duodenal fold thickening or stricture. No gross gastric or duodenal ulcer. Normal location of the gastrojejunal junction. IMPRESSION: 1. Diffusely prominently thickened gastric folds, most prominent in the proximal body of the stomach. This is a nonspecific finding with a broad differential including H pylori gastritis, Menetrier's disease  or lymphoma. 2. Mild esophageal dysmotility. 3. Otherwise unremarkable limited  water-soluble upper GI. 4. Nonobstructive bowel gas pattern on scout radiograph of the abdomen. Electronically Signed   By: Ilona Sorrel M.D.   On: 04/03/2016 09:25    Anti-infectives: Anti-infectives    Start     Dose/Rate Route Frequency Ordered Stop   04/02/16 0200  ampicillin-sulbactam (UNASYN) 1.5 g in sodium chloride 0.9 % 50 mL IVPB     1.5 g 100 mL/hr over 30 Minutes Intravenous Every 6 hours 04/01/16 2046     04/01/16 1200  ampicillin-sulbactam (UNASYN) 1.5 g in sodium chloride 0.9 % 50 mL IVPB  Status:  Discontinued     1.5 g 100 mL/hr over 30 Minutes Intravenous Every 6 hours 04/01/16 1049 04/01/16 2046   04/01/16 1000  ciprofloxacin (CIPRO) IVPB 400 mg  Status:  Discontinued     400 mg 200 mL/hr over 60 Minutes Intravenous Every 12 hours 03/31/16 2115 04/01/16 1049   03/31/16 2200  metroNIDAZOLE (FLAGYL) IVPB 500 mg  Status:  Discontinued     500 mg 100 mL/hr over 60 Minutes Intravenous Every 8 hours 03/31/16 2112 04/01/16 1049   03/31/16 2115  ciprofloxacin (CIPRO) IVPB 400 mg     400 mg 200 mL/hr over 60 Minutes Intravenous STAT 03/31/16 2114 04/01/16 0206      Assessment/Plan: s/p * No surgery found * continue carafate and protonix for possible gastritis  Advance to soft food later today  LOS: 4 days    TOTH III,PAUL S 04/04/2016

## 2016-04-05 LAB — COMPREHENSIVE METABOLIC PANEL
ALBUMIN: 2 g/dL — AB (ref 3.5–5.0)
ALT: 19 U/L (ref 14–54)
ANION GAP: 6 (ref 5–15)
AST: 41 U/L (ref 15–41)
Alkaline Phosphatase: 93 U/L (ref 38–126)
BUN: <5 mg/dL — ABNORMAL LOW (ref 6–20)
CALCIUM: 7.3 mg/dL — AB (ref 8.9–10.3)
CO2: 26 mmol/L (ref 22–32)
Chloride: 110 mmol/L (ref 101–111)
Creatinine, Ser: 0.36 mg/dL — ABNORMAL LOW (ref 0.44–1.00)
GFR calc non Af Amer: >60 mL/min (ref >60)
Glucose, Bld: 101 mg/dL — ABNORMAL HIGH (ref 65–99)
POTASSIUM: 3.6 mmol/L (ref 3.5–5.1)
SODIUM: 142 mmol/L (ref 135–145)
TOTAL PROTEIN: 4.8 g/dL — AB (ref 6.5–8.1)
Total Bilirubin: 0.5 mg/dL (ref 0.3–1.2)

## 2016-04-05 LAB — CORTISOL: Cortisol, Plasma: 7.7 ug/dL

## 2016-04-05 MED ORDER — SODIUM CHLORIDE 0.9 % IV BOLUS (SEPSIS)
250.0000 mL | Freq: Once | INTRAVENOUS | Status: AC
  Administered 2016-04-05: 250 mL via INTRAVENOUS

## 2016-04-05 MED ORDER — SODIUM CHLORIDE 0.9 % IV SOLN
INTRAVENOUS | Status: DC
  Administered 2016-04-05 (×4): via INTRAVENOUS

## 2016-04-05 MED ORDER — POTASSIUM CHLORIDE 20 MEQ/15ML (10%) PO SOLN
40.0000 meq | Freq: Once | ORAL | Status: AC
Start: 2016-04-05 — End: 2016-04-05
  Administered 2016-04-05: 40 meq via ORAL
  Filled 2016-04-05: qty 30

## 2016-04-05 MED ORDER — SODIUM CHLORIDE 0.9 % IV BOLUS (SEPSIS)
1000.0000 mL | Freq: Once | INTRAVENOUS | Status: AC
  Administered 2016-04-05: 1000 mL via INTRAVENOUS

## 2016-04-05 NOTE — Progress Notes (Signed)
PROGRESS NOTE  [name] Bell:[id number] DOB: 1945-04-08 DOA: 03/31/2016 PCP: Albin Felling, MD  HPI/Recap of past 24 hours:  Had brief hypotension required fluids bolus this am, she denies ab pain, no n/v today, no fever Some liquid output from colostomy   Assessment/Plan: Principal Problem:   Colitis Active Problems:   Colostomy in place   Small bowel obstruction   Nausea and vomiting   Hypokalemia   Malnutrition of moderate degree  Acute colitis with possible bowel obstruction, history of colostomy -- MS contin by mouth continued since she has opioid dependence. --IV cipro and flagyl initially, changed to iv unasyn, plan to discharge on augmentin to finish total of 7 days, abx day 5 as of 3/11 --she received NS @ 125cc/hr since admission, ivf stopped on 3/8, restart ivf on 3/9 due to remain poor oral intake, low normal bp. --general surgery following, no indication for surgery,   Persistent N/V,  (per chart review patient has a history of gi outlet obstruction required EGD dilation in 2009) -poor oral intake, start gentle hydration on 3/9 -[facility] consulted, upper gi serious "1. Diffusely prominently thickened gastric folds, most prominent in the proximal body of the stomach. This is a nonspecific finding with a broad differential including H pylori gastritis, Menetrier's disease or lymphoma. 2. Mild esophageal dysmotility. 3. Otherwise unremarkable limited water-soluble upper GI. 4. Nonobstructive bowel gas pattern on scout radiograph of the Abdomen. -she has been on protoninc bid since admission, sulcralfate added on 3/9 -EGD on 3/12, appreciate [facility] input  History of bleeding duodenal ulcer, question of coffee ground emesis --Follow H/H --IV PPI q12h  -egd on 3/12 -appreciate [facility] input  Hypokalemia --Potassium replacement  Abnormal U/A without LUTS --Culture no growth --already on abx   NSVT: 5beats around 9am on 3/7, 6beats of svt on 3/7  9pm,  bp borderline low, home meds coreg resumed,  Echocardiogram exam is limited but lvef wnl. , keep k>4, mag>2 tsh 2.3    Chronic productive cough, prior smoker, cxr on 2/20 with chronic bronchitis changes, currently on room air, no respiratory distress, repeat chest x ray, again no acute findings, start on mucinex, already on abx Report cough has almost resolved   severe (moderate) malnutrition in context of chronic illness Body mass index is 19.89 kg/m. Nutrition input appreciated  Macular degeneration: poor vision, fall precaution,   DVT prophylaxis: SCDs Code Status: FULL Family Communication: Patient alone , report no immediate family Disposition Plan: SNF, now she feels stronger, wonder if she could go home, awaiting PT reeval, will need Gi and general surgery for discharge clearance Consults called:  General Surgery  [facility]  Procedures:  EGD on 3/12  Antibiotics:  Cipro/flagyl  unasyn   Objective: BP (!) 114/41 (BP Location: Left Arm)   Pulse 94   Temp 98.1 F (36.7 C) (Oral)   Resp 12   Ht 5\' 4"  (1.626 m)   Wt 52.6 kg (115 lb 14.4 oz)   LMP 08/25/1980   SpO2 97%   BMI 19.89 kg/m   Intake/Output Summary (Last 24 hours) at 04/05/16 0936 Last data filed at 04/05/16 0900  Gross per 24 hour  Intake             1080 ml  Output             1950 ml  Net             -870 ml   [employer]  03/31/16 2113 03/31/16 2242  Weight: 55 kg (121 lb 4.1 oz) 52.6 kg (115 lb 14.4 oz)    Exam:   General:  Frail, legally blind at baseline for years  Cardiovascular: RRR  Respiratory: CTABL  Abdomen: Soft/ND/NT, positive BS, Ostomy to LLQ.  Liquid brown stool in the bag.  Musculoskeletal: No Edema  Neuro: aaox3  Data Reviewed: Basic Metabolic Panel:  Recent Labs Lab 03/31/16 1618 04/01/16 0653 04/01/16 1108 04/02/16 0625 04/03/16 0606 04/04/16 0547 04/05/16 0531  NA 135 139  --  140 139 138 142  K 2.9* 3.7  --  4.4 3.7 2.9* 3.6  CL 91*  103  --  111 107 106 110  CO2 34* 29  --  23 27 27 26   GLUCOSE 109* 90  --  98 97 123* 101*  BUN 10 7  --  <5* <5* <5* <5*  CREATININE 0.58 0.62  --  0.45 0.41* 0.47 0.36*  CALCIUM 8.7* 7.5*  --  6.8* 7.1* 7.1* 7.3*  MG 1.8  --  2.0  --   --  1.8  --    Liver Function Tests:  Recent Labs Lab 03/31/16 1618 04/03/16 0606 04/04/16 0547 04/05/16 0531  AST 51* 42* 40 41  ALT 21 19 19 19   ALKPHOS 114 93 88 93  BILITOT 0.9 0.7 0.6 0.5  PROT 5.2* 4.4* 4.2* 4.8*  ALBUMIN 2.3* 1.9* 1.8* 2.0*    Recent Labs Lab 03/31/16 1618  LIPASE 17   No results for input(s): AMMONIA in the last 168 hours. CBC:  Recent Labs Lab 03/31/16 1618 04/01/16 0653 04/02/16 0625 04/03/16 0606  WBC 8.4 9.8 9.0 9.8  NEUTROABS 4.9  --  5.9 5.5  HGB 15.5* 16.7* 13.6 14.0  HCT 44.9 47.0* 40.5 40.6  MCV 90.3 91.6 92.0 92.1  PLT 236 206 226 205   Cardiac Enzymes:   No results for input(s): CKTOTAL, CKMB, CKMBINDEX, TROPONINI in the last 168 hours. BNP (last 3 results) No results for input(s): BNP in the last 8760 hours.  ProBNP (last 3 results) No results for input(s): PROBNP in the last 8760 hours.  CBG: No results for input(s): GLUCAP in the last 168 hours.  Recent Results (from the past 240 hour(s))  Urine culture     Status: Abnormal   Collection Time: 03/31/16  4:00 PM  Result Value Ref Range Status   Specimen Description URINE, CLEAN CATCH  Final   Special Requests NONE  Final   Culture MULTIPLE SPECIES PRESENT, SUGGEST RECOLLECTION (A)  Final   Report Status 04/02/2016 FINAL  Final  MRSA culture     Status: None   Collection Time: 03/31/16 11:38 PM  Result Value Ref Range Status   Specimen Description NOSE  Final   Special Requests NONE  Final   Culture   Final    NO MRSA DETECTED Performed at [facility], [address] [place], [place] [address]    Report Status 04/01/2016 FINAL  Final  MRSA PCR Screening     Status: Abnormal   Collection Time: 03/31/16 11:39 PM    Result Value Ref Range Status   MRSA by PCR INVALID RESULTS, SPECIMEN SENT FOR CULTURE (A) NEGATIVE Final    Comment:        The GeneXpert MRSA Assay (FDA approved for NASAL specimens only), is one component of a comprehensive MRSA colonization surveillance program. It is not intended to diagnose MRSA infection nor to guide or monitor treatment for MRSA  infections. RESULT CALLED TO, READ BACK BY AND VERIFIED WITH: M STEFFENS RN @ [phone] ON 04/01/16 BY C DAVIS      Studies: No results found.  Scheduled Meds: . ampicillin-sulbactam (UNASYN) IV  1.5 g Intravenous Q6H  . chlorhexidine  15 mL Mouth Rinse BID  . feeding supplement  1 Container Oral BID BM  . feeding supplement (PRO-STAT SUGAR FREE 64)  30 mL Oral BID  . guaiFENesin  600 mg Oral BID  . mouth rinse  15 mL Mouth Rinse q12n4p  . metoprolol tartrate  12.5 mg Oral BID  . morphine  30 mg Oral Q12H  . pantoprazole (PROTONIX) IV  40 mg Intravenous Q12H  . potassium chloride  40 mEq Oral Once  . sodium chloride flush  3 mL Intravenous Q12H  . sucralfate  1 g Oral TID WC & HS    Continuous Infusions: . sodium chloride 75 mL/hr at 04/05/16 0745     Time spent: 29mins  Rossi Burdo MD, PhD  [facility] Pager [phone]. If 7PM-7AM, please contact night-coverage at [email or web address], password [facility] 04/05/2016, 9:36 AM  LOS: 5 days

## 2016-04-05 NOTE — Progress Notes (Signed)
Physical Therapy Treatment Patient Details Name: [name] Bell MRN: [id number] DOB: [birth year] Today's Date: 04/05/2016    History of Present Illness 71 yo female admitted with colitis. Hx of colectomy, duodenal ulcer, mac degeneration, chronic narcotics dependence, chronic back pain, legally blind.     PT Comments    Pt reports she is feeling better and getting stronger since her nausea has subsided, and she was able to eat food today.  Pt would like to d/c home.  Pt has rollator at home she could use for rest breaks.  May benefit from aide and recommend HHPT.   Follow Up Recommendations  Home health PT;Supervision - Intermittent     Equipment Recommendations  None recommended by PT    Recommendations for Other Services       Precautions / Restrictions Precautions Precautions: Fall Precaution Comments: legally blind    Mobility  Bed Mobility Overal bed mobility: Needs Assistance Bed Mobility: Supine to Sit;Sit to Supine     Supine to sit: Supervision Sit to supine: Supervision   General bed mobility comments: increased time however no physical assist required  Transfers Overall transfer level: Needs assistance Equipment used: Rolling walker (2 wheeled) Transfers: Sit to/from Stand Sit to Stand: Min guard         General transfer comment: min/guard for safety  Ambulation/Gait Ambulation/Gait assistance: Min guard Ambulation Distance (Feet): 25 Feet Assistive device:  (pushed IV pole) Gait Pattern/deviations: Step-through pattern;Decreased stride length     General Gait Details: slow gait, fatigues quickly but able to tolerate improved distance compared to last session, no nausea today   Stairs            Wheelchair Mobility    Modified Rankin (Stroke Patients Only)       Balance                                    Cognition Arousal/Alertness: Awake/alert Behavior During Therapy: WFL for tasks assessed/performed Overall  Cognitive Status: Within Functional Limits for tasks assessed                      Exercises      General Comments        Pertinent Vitals/Pain Pain Assessment: No/denies pain    Home Living                      Prior Function            PT Goals (current goals can now be found in the care plan section) Progress towards PT goals: Progressing toward goals    Frequency    Min 3X/week      PT Plan Discharge plan needs to be updated    Co-evaluation             End of Session Equipment Utilized During Treatment: Gait belt Activity Tolerance: Patient limited by fatigue Patient left: in bed;with call bell/phone within reach;with bed alarm set   PT Visit Diagnosis: Muscle weakness (generalized) (M62.81);Difficulty in walking, not elsewhere classified (R26.2)     Time: 3785-8850 PT Time Calculation (min) (ACUTE ONLY): 21 min  Charges:  $Gait Training: 8-22 mins                    G Codes:       Nakaya Mishkin,KATHrine E 04/05/2016, 1:11 PM Carmelia Bake, PT, DPT 04/05/2016 Pager: [phone]

## 2016-04-05 NOTE — Progress Notes (Signed)
  Subjective: Resting comfortably. No complaints  Objective: Vital signs in last 24 hours: Temp:  [98.1 F (36.7 C)-98.2 F (36.8 C)] 98.1 F (36.7 C) (03/11 0544) Pulse Rate:  [79-94] 94 (03/11 0850) Resp:  [12-17] 12 (03/11 0544) BP: (84-114)/(39-55) 114/41 (03/11 0850) SpO2:  [97 %-100 %] 97 % (03/11 0544) Last BM Date: 04/04/16  Intake/Output from previous day: 03/10 0701 - 03/11 0700 In: 1040 [P.O.:840; IV Piggyback:200] Out: 2350 [Urine:2150; Stool:200] Intake/Output this shift: Total I/O In: 280 [P.O.:280] Out: 200 [Urine:200]  Resp: clear to auscultation bilaterally Cardio: regular rate and rhythm GI: soft, nontender. ostomy productive  Lab Results:   Recent Labs  04/03/16 0606  WBC 9.8  HGB 14.0  HCT 40.6  PLT 205   BMET  Recent Labs  04/04/16 0547 04/05/16 0531  NA 138 142  K 2.9* 3.6  CL 106 110  CO2 27 26  GLUCOSE 123* 101*  BUN <5* <5*  CREATININE 0.47 0.36*  CALCIUM 7.1* 7.3*   PT/INR  Recent Labs  04/04/16 0547  LABPROT 13.9  INR 1.07   ABG No results for input(s): PHART, HCO3 in the last 72 hours.  Invalid input(s): PCO2, PO2  Studies/Results: No results found.  Anti-infectives: Anti-infectives    Start     Dose/Rate Route Frequency Ordered Stop   04/02/16 0200  ampicillin-sulbactam (UNASYN) 1.5 g in sodium chloride 0.9 % 50 mL IVPB     1.5 g 100 mL/hr over 30 Minutes Intravenous Every 6 hours 04/01/16 2046     04/01/16 1200  ampicillin-sulbactam (UNASYN) 1.5 g in sodium chloride 0.9 % 50 mL IVPB  Status:  Discontinued     1.5 g 100 mL/hr over 30 Minutes Intravenous Every 6 hours 04/01/16 1049 04/01/16 2046   04/01/16 1000  ciprofloxacin (CIPRO) IVPB 400 mg  Status:  Discontinued     400 mg 200 mL/hr over 60 Minutes Intravenous Every 12 hours 03/31/16 2115 04/01/16 1049   03/31/16 2200  metroNIDAZOLE (FLAGYL) IVPB 500 mg  Status:  Discontinued     500 mg 100 mL/hr over 60 Minutes Intravenous Every 8 hours 03/31/16  2112 04/01/16 1049   03/31/16 2115  ciprofloxacin (CIPRO) IVPB 400 mg     400 mg 200 mL/hr over 60 Minutes Intravenous STAT 03/31/16 2114 04/01/16 0206      Assessment/Plan: s/p Procedure(s): ESOPHAGOGASTRODUODENOSCOPY (EGD) (N/A) Advance diet  No sign of obstruction Continue carafate and protonix for gastritis  LOS: 5 days    TOTH III,Johnthomas Lader S 04/05/2016

## 2016-04-05 NOTE — Progress Notes (Signed)
[facility] Progress Note  Subjective: Still feeling better, just atebreakfast and tolerated supper.  Objective: Vital signs in last 24 hours: Temp:  [98.1 F (36.7 C)-98.2 F (36.8 C)] 98.1 F (36.7 C) (03/11 0544) Pulse Rate:  [79-94] 94 (03/11 0850) Resp:  [12-17] 12 (03/11 0544) BP: (84-114)/(39-55) 114/41 (03/11 0850) SpO2:  [97 %-100 %] 97 % (03/11 0544) Weight change:    PE: Unchanged  Lab Results: Results for orders placed or performed during the hospital encounter of 03/31/16 (from the past 24 hour(s))  Comprehensive metabolic panel     Status: Abnormal   Collection Time: 04/05/16  5:31 AM  Result Value Ref Range   Sodium 142 135 - 145 mmol/L   Potassium 3.6 3.5 - 5.1 mmol/L   Chloride 110 101 - 111 mmol/L   CO2 26 22 - 32 mmol/L   Glucose, Bld 101 (H) 65 - 99 mg/dL   BUN <5 (L) 6 - 20 mg/dL   Creatinine, Ser 0.36 (L) 0.44 - 1.00 mg/dL   Calcium 7.3 (L) 8.9 - 10.3 mg/dL   Total Protein 4.8 (L) 6.5 - 8.1 g/dL   Albumin 2.0 (L) 3.5 - 5.0 g/dL   AST 41 15 - 41 U/L   ALT 19 14 - 54 U/L   Alkaline Phosphatase 93 38 - 126 U/L   Total Bilirubin 0.5 0.3 - 1.2 mg/dL   GFR calc non Af Amer >60 >60 mL/min   GFR calc Af Amer >60 >60 mL/min   Anion gap 6 5 - 15    Studies/Results: No results found.    Assessment: Nausea vomiting with reported weight loss, history of pyloric stenosis requiring dilatation 9 years ago and hypertrophied proximal gastric folds on upper GI series with differential diagnosis not excluding lymphoma or other infiltrative neoplasm.  Plan: Have suggested that she have EGD tomorrow and she is agreeable. We will hold nothing by mouth after midnight and scheduled for tomorrow morning.    Zelina Jimerson C 04/05/2016, 9:10 AM  Pager [phone] If no answer or after 5 PM call [phone]

## 2016-04-06 ENCOUNTER — Encounter (HOSPITAL_COMMUNITY): Payer: Self-pay

## 2016-04-06 ENCOUNTER — Inpatient Hospital Stay (HOSPITAL_COMMUNITY): Payer: Medicare Other | Admitting: Certified Registered Nurse Anesthetist

## 2016-04-06 ENCOUNTER — Encounter (HOSPITAL_COMMUNITY)
Admission: EM | Disposition: A | Discharge status: Home Health | Payer: Self-pay | Source: Home / Self Care | Attending: Internal Medicine

## 2016-04-06 HISTORY — PX: ESOPHAGOGASTRODUODENOSCOPY: SHX5428

## 2016-04-06 LAB — BASIC METABOLIC PANEL
Anion gap: 4 — ABNORMAL LOW (ref 5–15)
BUN: <5 mg/dL — ABNORMAL LOW (ref 6–20)
CHLORIDE: 110 mmol/L (ref 101–111)
CO2: 28 mmol/L (ref 22–32)
Calcium: 7.7 mg/dL — ABNORMAL LOW (ref 8.9–10.3)
Glucose, Bld: 97 mg/dL (ref 65–99)
Potassium: 3.5 mmol/L (ref 3.5–5.1)
SODIUM: 142 mmol/L (ref 135–145)

## 2016-04-06 LAB — CBC
HEMATOCRIT: 34.7 % — AB (ref 36.0–46.0)
HEMOGLOBIN: 12.2 g/dL (ref 12.0–15.0)
MCH: 32.3 pg (ref 26.0–34.0)
MCHC: 35.2 g/dL (ref 30.0–36.0)
MCV: 91.8 fL (ref 78.0–100.0)
Platelets: 198 10*3/uL (ref 150–400)
RBC: 3.78 MIL/uL — AB (ref 3.87–5.11)
RDW: 14.2 % (ref 11.5–15.5)
WBC: 8.4 10*3/uL (ref 4.0–10.5)

## 2016-04-06 LAB — MAGNESIUM: MAGNESIUM: 1.8 mg/dL (ref 1.7–2.4)

## 2016-04-06 LAB — H. PYLORI ANTIBODY, IGG

## 2016-04-06 SURGERY — EGD (ESOPHAGOGASTRODUODENOSCOPY)
Anesthesia: Monitor Anesthesia Care

## 2016-04-06 MED ORDER — PROPOFOL 500 MG/50ML IV EMUL
INTRAVENOUS | Status: DC | PRN
  Administered 2016-04-06: 150 ug/kg/min via INTRAVENOUS

## 2016-04-06 MED ORDER — ONDANSETRON HCL 4 MG/2ML IJ SOLN
INTRAMUSCULAR | Status: DC | PRN
  Administered 2016-04-06: 4 mg via INTRAVENOUS

## 2016-04-06 MED ORDER — LIDOCAINE 2% (20 MG/ML) 5 ML SYRINGE
INTRAMUSCULAR | Status: DC | PRN
  Administered 2016-04-06: 50 mg via INTRAVENOUS

## 2016-04-06 MED ORDER — PROPOFOL 10 MG/ML IV BOLUS
INTRAVENOUS | Status: AC
  Filled 2016-04-06: qty 40

## 2016-04-06 MED ORDER — LIDOCAINE 2% (20 MG/ML) 5 ML SYRINGE
INTRAMUSCULAR | Status: AC
  Filled 2016-04-06: qty 5

## 2016-04-06 MED ORDER — LACTATED RINGERS IV SOLN
INTRAVENOUS | Status: DC | PRN
  Administered 2016-04-06: 09:00:00 via INTRAVENOUS

## 2016-04-06 MED ORDER — SODIUM CHLORIDE 0.9 % IV SOLN
30.0000 meq | INTRAVENOUS | Status: AC
  Administered 2016-04-06: 30 meq via INTRAVENOUS
  Filled 2016-04-06 (×2): qty 15

## 2016-04-06 MED ORDER — SODIUM CHLORIDE 0.9 % IV SOLN: INTRAVENOUS | Status: DC

## 2016-04-06 MED ORDER — ONDANSETRON HCL 4 MG/2ML IJ SOLN
INTRAMUSCULAR | Status: AC
  Filled 2016-04-06: qty 2

## 2016-04-06 NOTE — Transfer of Care (Signed)
Immediate Anesthesia Transfer of Care Note  Patient: [name] Bell  Procedure(s) Performed: Procedure(s): ESOPHAGOGASTRODUODENOSCOPY (EGD) (N/A)  Patient Location: PACU  Anesthesia Type:MAC  Level of Consciousness:  sedated, patient cooperative and responds to stimulation  Airway & Oxygen Therapy:Patient Spontanous Breathing and Patient connected to face mask oxgen  Post-op Assessment:  Report given to PACU RN and Post -op Vital signs reviewed and stable  Post vital signs:  Reviewed and stable  Last Vitals:  Vitals:   04/06/16 0609 04/06/16 0844  BP: (!) 113/38 (!) 130/33  Pulse: (!) 58 (!) 102  Resp: 16 18  Temp: 37 C 28.1 C    Complications: No apparent anesthesia complications

## 2016-04-06 NOTE — Anesthesia Postprocedure Evaluation (Signed)
Anesthesia Post Note  Patient: Shardae G Buscemi  Procedure(s) Performed: Procedure(s) (LRB): ESOPHAGOGASTRODUODENOSCOPY (EGD) (N/A)  Patient location during evaluation: PACU Anesthesia Type: MAC Level of consciousness: awake and alert Pain management: pain level controlled Vital Signs Assessment: post-procedure vital signs reviewed and stable Respiratory status: spontaneous breathing, nonlabored ventilation and respiratory function stable Cardiovascular status: stable and blood pressure returned to baseline Anesthetic complications: no       Last Vitals:  Vitals:   04/06/16 1010 04/06/16 1020  BP: (!) 133/54 (!) 131/43  Pulse: 99 98  Resp: 17 (!) 24  Temp:      Last Pain:  Vitals:   04/06/16 1020  TempSrc:   PainSc: [place] Ray Miller

## 2016-04-06 NOTE — Progress Notes (Signed)
  General Surgery [facility]  Assessment & Plan:  Gastritis, esophageal stricture (recurrent), duodenal stricture  Results of EGD this AM noted - successful dilatation by Dr. Talmadge Chad - on Rx  Wants to resume diet Parastomal hernia - stable  No sign of obstruction  Continue medical management for gastritis.  Further recs and follow up per gastroenterology.  General surgery will sign off - no operative intervention required - call if needed.        Earnstine Regal, MD, [facility]       Office: [phone]    Subjective: Patient in bed, just back from EGD this AM.  No complaints.  Wants to eat.  Objective: Vital signs in last 24 hours: Temp:  [97.7 F (36.5 C)-98.6 F (37 C)] 97.8 F (36.6 C) (03/12 1000) Pulse Rate:  [58-103] 98 (03/12 1020) Resp:  [16-24] 24 (03/12 1020) BP: (93-141)/(33-67) 131/43 (03/12 1020) SpO2:  [97 %-100 %] 100 % (03/12 1020) Last BM Date: 04/04/16  Intake/Output from previous day: 03/11 0701 - 03/12 0700 In: 3201.3 [P.O.:520; I.V.:1481.3; IV Piggyback:1200] Out: 5700 [Urine:5700] Intake/Output this shift: Total I/O In: 500 [I.V.:500] Out: -   Physical Exam: HEENT - sclerae clear, mucous membranes moist Abdomen - soft without distension; BS present; non-tender Ext - no edema, non-tender  Lab Results:   Recent Labs  04/06/16 0521  WBC 8.4  HGB 12.2  HCT 34.7*  PLT 198   BMET  Recent Labs  04/05/16 0531 04/06/16 0521  NA 142 142  K 3.6 3.5  CL 110 110  CO2 26 28  GLUCOSE 101* 97  BUN <5* <5*  CREATININE 0.36* <0.30*  CALCIUM 7.3* 7.7*   PT/INR  Recent Labs  04/04/16 0547  LABPROT 13.9  INR 1.07   Comprehensive Metabolic Panel:    Component Value Date/Time   NA 142 04/06/2016 0521   NA 142 04/05/2016 0531   NA 142 03/17/2016 1657   NA 148 (H) 11/21/2014 1031   K 3.5 04/06/2016 0521   K 3.6 04/05/2016 0531   CL 110 04/06/2016 0521   CL 110  04/05/2016 0531   CO2 28 04/06/2016 0521   CO2 26 04/05/2016 0531   BUN <5 (L) 04/06/2016 0521   BUN <5 (L) 04/05/2016 0531   BUN 7 (L) 03/17/2016 1657   BUN 5 (L) 11/21/2014 1031   CREATININE <0.30 (L) 04/06/2016 0521   CREATININE 0.36 (L) 04/05/2016 0531   CREATININE 0.69 10/11/2013 1700   CREATININE 0.79 10/11/2012 1642   GLUCOSE 97 04/06/2016 0521   GLUCOSE 101 (H) 04/05/2016 0531   CALCIUM 7.7 (L) 04/06/2016 0521   CALCIUM 7.3 (L) 04/05/2016 0531   AST 41 04/05/2016 0531   AST 40 04/04/2016 0547   ALT 19 04/05/2016 0531   ALT 19 04/04/2016 0547   ALKPHOS 93 04/05/2016 0531   ALKPHOS 88 04/04/2016 0547   BILITOT 0.5 04/05/2016 0531   BILITOT 0.6 04/04/2016 0547   PROT 4.8 (L) 04/05/2016 0531   PROT 4.2 (L) 04/04/2016 0547   ALBUMIN 2.0 (L) 04/05/2016 0531   ALBUMIN 1.8 (L) 04/04/2016 0547    Studies/Results: No results found.    [name] Bell 04/06/2016  Patient ID: [name] Bell, female   DOB: 22-Sep-1945, 71 y.o.   MRN: [id number]

## 2016-04-06 NOTE — Addendum Note (Signed)
Addendum  created 04/06/16 1101 by Lynda Rainwater, MD   Delete clinical note

## 2016-04-06 NOTE — Anesthesia Preprocedure Evaluation (Signed)
Anesthesia Evaluation  Patient identified by MRN, date of birth, ID band Patient awake    Reviewed: Allergy & Precautions, H&P , NPO status   Airway Mallampati: I  TM Distance: >3 FB     Dental  (+) Edentulous Upper, Edentulous Lower, Dental Advisory Given   Pulmonary COPD,  COPD inhaler, Current Smoker, former smoker,    breath sounds clear to auscultation       Cardiovascular Exercise Tolerance: Good negative cardio ROS   Rhythm:Regular Rate:Tachycardia     Neuro/Psych Anxiety Depression negative neurological ROS     GI/Hepatic Neg liver ROS, PUD, GERD  Medicated and Controlled,  Endo/Other  negative endocrine ROS  Renal/GU negative Renal ROS  negative genitourinary   Musculoskeletal negative musculoskeletal ROS (+)   Abdominal   Peds  Hematology negative hematology ROS (+)   Anesthesia Other Findings   Reproductive/Obstetrics negative OB ROS                             Anesthesia Physical  Anesthesia Plan  ASA: III and emergent  Anesthesia Plan: MAC   Post-op Pain Management:    Induction: Intravenous  Airway Management Planned: Nasal Cannula  Additional Equipment:   Intra-op Plan:   Post-operative Plan:   Informed Consent: I have reviewed the patients History and Physical, chart, labs and discussed the procedure including the risks, benefits and alternatives for the proposed anesthesia with the patient or authorized representative who has indicated his/her understanding and acceptance.     Plan Discussed with: CRNA, Anesthesiologist and Surgeon  Anesthesia Plan Comments:         Anesthesia Quick Evaluation

## 2016-04-06 NOTE — Progress Notes (Signed)
PROGRESS NOTE  [name] Bell AVW:[id number] DOB: 10-24-1945 DOA: 03/31/2016 PCP: Albin Felling, MD  HPI/Recap of past 24 hours:  Returned from EGD s/p dilation of the esophageal stenosis and duodenal stenosis. Report feeling better, she denies ab pain, no n/v today, no fever Some liquid output from colostomy   Assessment/Plan: Principal Problem:   Colitis Active Problems:   Colostomy in place   Small bowel obstruction   Nausea and vomiting   Hypokalemia   Malnutrition of moderate degree  Acute colitis with possible bowel obstruction, history of colostomy/stable parastonal hernia -- MS contin by mouth continued since she has opioid dependence. --IV cipro and flagyl initially, changed to iv unasyn, plan to discharge on augmentin to finish total of 7 days, abx day 6 as of 3/12 --she received NS @ 125cc/hr since admission, ivf stopped on 3/8, restart ivf on 3/9 due to remain poor oral intake, low normal bp. --general surgery following, no indication for surgery,   Persistent N/V,  (per chart review patient has a history of gi outlet obstruction required EGD dilation in 2009) --[facility] consulted, upper gi serious "1. Diffusely prominently thickened gastric folds, most prominent in the proximal body of the stomach. This is a nonspecific finding with a broad differential including H pylori gastritis, Menetrier's disease or lymphoma. 2. Mild esophageal dysmotility. 3. Otherwise unremarkable limited water-soluble upper GI. 4. Nonobstructive bowel gas pattern on scout radiograph of the Abdomen. -she has been on protoninc bid since admission, sulcralfate added on 3/9 -EGD on 3/12,s/p dilation of the esophageal stenosis and duodenal stenosis. Also +Erythematous mucosa in the stomach. Diet advancement per [facility].  History of bleeding duodenal ulcer, question of coffee ground emesis --Follow H/H --IV PPI q12h  -egd on 3/12, +Erythematous mucosa in the stomach -appreciate [facility]  input  Hypokalemia --Potassium replacement  Abnormal U/A without LUTS --Culture no growth --already on abx   NSVT: 5beats around 9am on 3/7, 6beats of svt on 3/7 9pm,  bp borderline low, home meds coreg resumed,  Echocardiogram exam is limited but lvef wnl. , keep k>4, mag>2 tsh 2.3    Chronic productive cough, prior smoker, cxr on 2/20 with chronic bronchitis changes, currently on room air, no respiratory distress, repeat chest x ray, again no acute findings, start on mucinex, already on abx Report cough has almost resolved   severe (moderate) malnutrition in context of chronic illness Body mass index is 19.89 kg/m. Nutrition input appreciated  Macular degeneration: poor vision, fall precaution,   DVT prophylaxis: SCDs Code Status: FULL Family Communication: Patient alone , report no immediate family Disposition Plan: return to ALF with home health,  need Gi  for discharge clearance Consults called:  General Surgery , signed off [facility]  Procedures:  EGD on 3/12 s/p esophageal stenosis dilation, s/p duodenal stenosis dilation.  Antibiotics:  Cipro/flagyl  unasyn   Objective: BP (!) 131/43   Pulse 98   Temp 97.8 F (36.6 C) (Oral)   Resp (!) 24   Ht 5\' 4"  (1.626 m)   Wt 52.6 kg (115 lb 14.4 oz)   LMP 08/25/1980   SpO2 100%   BMI 19.89 kg/m   Intake/Output Summary (Last 24 hours) at 04/06/16 1711 Last data filed at 04/06/16 1001  Gross per 24 hour  Intake             2715 ml  Output             3200 ml  Net             -  485 ml   Filed Weights   03/31/16 2113 03/31/16 2242  Weight: 55 kg (121 lb 4.1 oz) 52.6 kg (115 lb 14.4 oz)    Exam:   General:  Frail, legally blind at baseline for years  Cardiovascular: RRR  Respiratory: CTABL  Abdomen: Soft/ND/NT, positive BS, Ostomy to LLQ.  Liquid brown stool in the bag.  Musculoskeletal: No Edema  Neuro: aaox3  Data Reviewed: Basic Metabolic Panel:  Recent Labs Lab 03/31/16 1618   04/01/16 1108 04/02/16 0625 04/03/16 0606 04/04/16 0547 04/05/16 0531 04/06/16 0521  NA 135  < >  --  140 139 138 142 142  K 2.9*  < >  --  4.4 3.7 2.9* 3.6 3.5  CL 91*  < >  --  111 107 106 110 110  CO2 34*  < >  --  23 27 27 26 28   GLUCOSE 109*  < >  --  98 97 123* 101* 97  BUN 10  < >  --  <5* <5* <5* <5* <5*  CREATININE 0.58  < >  --  0.45 0.41* 0.47 0.36* <0.30*  CALCIUM 8.7*  < >  --  6.8* 7.1* 7.1* 7.3* 7.7*  MG 1.8  --  2.0  --   --  1.8  --  1.8  < > = values in this interval not displayed. Liver Function Tests:  Recent Labs Lab 03/31/16 1618 04/03/16 0606 04/04/16 0547 04/05/16 0531  AST 51* 42* 40 41  ALT 21 19 19 19   ALKPHOS 114 93 88 93  BILITOT 0.9 0.7 0.6 0.5  PROT 5.2* 4.4* 4.2* 4.8*  ALBUMIN 2.3* 1.9* 1.8* 2.0*    Recent Labs Lab 03/31/16 1618  LIPASE 17   No results for input(s): AMMONIA in the last 168 hours. CBC:  Recent Labs Lab 03/31/16 1618 04/01/16 0653 04/02/16 0625 04/03/16 0606 04/06/16 0521  WBC 8.4 9.8 9.0 9.8 8.4  NEUTROABS 4.9  --  5.9 5.5  --   HGB 15.5* 16.7* 13.6 14.0 12.2  HCT 44.9 47.0* 40.5 40.6 34.7*  MCV 90.3 91.6 92.0 92.1 91.8  PLT 236 206 226 205 198   Cardiac Enzymes:   No results for input(s): CKTOTAL, CKMB, CKMBINDEX, TROPONINI in the last 168 hours. BNP (last 3 results) No results for input(s): BNP in the last 8760 hours.  ProBNP (last 3 results) No results for input(s): PROBNP in the last 8760 hours.  CBG: No results for input(s): GLUCAP in the last 168 hours.  Recent Results (from the past 240 hour(s))  Urine culture     Status: Abnormal   Collection Time: 03/31/16  4:00 PM  Result Value Ref Range Status   Specimen Description URINE, CLEAN CATCH  Final   Special Requests NONE  Final   Culture MULTIPLE SPECIES PRESENT, SUGGEST RECOLLECTION (A)  Final   Report Status 04/02/2016 FINAL  Final  MRSA culture     Status: None   Collection Time: 03/31/16 11:38 PM  Result Value Ref Range Status    Specimen Description NOSE  Final   Special Requests NONE  Final   Culture   Final    NO MRSA DETECTED Performed at [facility], [address], [place], [place] [address]    Report Status 04/01/2016 FINAL  Final  MRSA PCR Screening     Status: Abnormal   Collection Time: 03/31/16 11:39 PM  Result Value Ref Range Status   MRSA by PCR INVALID RESULTS, SPECIMEN SENT FOR  CULTURE (A) NEGATIVE Final    Comment:        The GeneXpert MRSA Assay (FDA approved for NASAL specimens only), is one component of a comprehensive MRSA colonization surveillance program. It is not intended to diagnose MRSA infection nor to guide or monitor treatment for MRSA infections. RESULT CALLED TO, READ BACK BY AND VERIFIED WITH: M STEFFENS RN @ [phone] ON 04/01/16 BY C DAVIS      Studies: No results found.  Scheduled Meds: . ampicillin-sulbactam (UNASYN) IV  1.5 g Intravenous Q6H  . chlorhexidine  15 mL Mouth Rinse BID  . feeding supplement  1 Container Oral BID BM  . feeding supplement (PRO-STAT SUGAR FREE 64)  30 mL Oral BID  . guaiFENesin  600 mg Oral BID  . mouth rinse  15 mL Mouth Rinse q12n4p  . metoprolol tartrate  12.5 mg Oral BID  . morphine  30 mg Oral Q12H  . pantoprazole (PROTONIX) IV  40 mg Intravenous Q12H  . sodium chloride flush  3 mL Intravenous Q12H  . sucralfate  1 g Oral TID WC & HS    Continuous Infusions: . sodium chloride 75 mL/hr at 04/05/16 1945     Time spent: 95mins  Aarti Mankowski MD, PhD  [facility] Pager [phone]. If 7PM-7AM, please contact night-coverage at [email or web address], password [facility] 04/06/2016, 5:11 PM  LOS: 6 days

## 2016-04-06 NOTE — Op Note (Signed)
[facility] Patient Name: [name] Bell Procedure Date: 04/06/2016 MRN: [id number] Attending MD: Wonda Horner , MD Date of Birth: [birth year] CSN: [id number] Age: 71 Admit Type: Inpatient Procedure:                Upper GI endoscopy Indications:              Persistent vomiting Providers:                Wonda Horner, MD, Laverta Baltimore RN, RN, Alfonso Patten, Technician, Christell Faith, CRNA Referring MD:              Medicines:                Propofol per Anesthesia Complications:            No immediate complications. Estimated Blood Loss:     Estimated blood loss was minimal. Procedure:                Pre-Anesthesia Assessment:                           - Prior to the procedure, a History and Physical                            was performed, and patient medications and                            allergies were reviewed. The patient's tolerance of                            previous anesthesia was also reviewed. The risks                            and benefits of the procedure and the sedation                            options and risks were discussed with the patient.                            All questions were answered, and informed consent                            was obtained. Prior Anticoagulants: The patient has                            taken no previous anticoagulant or antiplatelet                            agents. ASA Grade Assessment: II - A patient with                            mild systemic disease. After reviewing the risks  and benefits, the patient was deemed in                            satisfactory condition to undergo the procedure.                           After obtaining informed consent, the endoscope was                            passed under direct vision. Throughout the                            procedure, the patient's blood pressure, pulse, and   oxygen saturations were monitored continuously. The                            was introduced through the mouth, and advanced to                            the second part of duodenum. The upper GI endoscopy                            was accomplished without difficulty. The patient                            tolerated the procedure well. Scope In: Scope Out: Findings:      One moderate stenosis was found at the GE junction area. The endoscope       would not pass beyond. A TTS dilator was passed through the scope.       Dilation with a 12-13.5-15 mm balloon dilator was performed to 13.5 mm.       After this the scope was able to be passed into the stomach.      Diffuse moderately erythematous mucosa was found in the stomach.       Biopsies were taken with a cold forceps for histology.      A stricture moderate stenosis was found in the duodenal bulb. The scope       would not pass into the 2nd portion of the duodenum A TTS dilator was       passed through the scope. Dilation with a 12-13.5-15 mm pyloric balloon       dilator was performed. Dilated to 13.58mm. Then the scope could pass into       the 2nd portion of the duodenum. Impression:               - Esophageal stenosis. Dilated.                           - Erythematous mucosa in the stomach. Biopsied. I                            did not appreciate markedly enlarged gastric folds.                           - Stricture duodenal stenosis. Dilated. Moderate Sedation:      . Recommendation:           -  Advance diet as tolerated.                           - Continue present medications. Procedure Code(s):        --- Professional ---                           [phone], Esophagogastroduodenoscopy, flexible,                            transoral; with dilation of gastric/duodenal                            stricture(s) (eg, balloon, bougie)                           43249, Esophagogastroduodenoscopy, flexible,                             transoral; with transendoscopic balloon dilation of                            esophagus (less than 30 mm diameter)                           43239, Esophagogastroduodenoscopy, flexible,                            transoral; with biopsy, single or multiple Diagnosis Code(s):        --- Professional ---                           K22.2, Esophageal obstruction                           K31.89, Other diseases of stomach and duodenum                           R11.10, Vomiting, unspecified CPT copyright 2016 [employer] Association. All rights reserved. The codes documented in this report are preliminary and upon coder review may  be revised to meet current compliance requirements. Wonda Horner, MD 04/06/2016 10:08:26 AM This report has been signed electronically. Number of Addenda: 0

## 2016-04-06 NOTE — H&P (Signed)
The patient is a 71 year old female who is having upper endoscopy today because of problems with nausea and vomiting and because she had an abnormal upper GI series showing thickened gastric folds. She does have a history in the past of having pyloric stenosis. This was dilated years ago.  Physical  No acute distress  Heart regular rhythm no murmurs heard  Lungs clear  Abdomen soft and nontender, there is a colostomy  Impression: Nausea and vomiting, abnormal upper GI, history of pyloric stenosis  Plan: EGD with possible balloon dilatation of pyloric channel

## 2016-04-06 NOTE — Progress Notes (Signed)
  Physical Therapy Treatment Patient Details Name: [name] Bell MRN: [id number] DOB: [birth year] Today's Date: 04/06/2016    History of Present Illness 71 yo female admitted with colitis. Hx of colectomy, duodenal ulcer, mac degeneration, chronic narcotics dependence, chronic back pain, legally blind.     PT Comments    Progressing with mobility.    Follow Up Recommendations  Home health PT;Supervision - Intermittent (Pt prefers home-declines SNF at this time. )     Equipment Recommendations  None recommended by PT    Recommendations for Other Services       Precautions / Restrictions Precautions Precautions: Fall Precaution Comments: legally blind Restrictions Weight Bearing Restrictions: No    Mobility  Bed Mobility   Bed Mobility: Supine to Sit;Sit to Supine     Supine to sit: Supervision Sit to supine: Supervision      Transfers Overall transfer level: Needs assistance   Transfers: Sit to/from Stand Sit to Stand: Min guard         General transfer comment: close guard for safety   Ambulation/Gait Ambulation/Gait assistance: Min guard;Min assist Ambulation Distance (Feet): 125 Feet Assistive device:  (IV pole) Gait Pattern/deviations: Step-through pattern;Decreased stride length     General Gait Details: slow gait. Unsteady at times, but pt had just received IV pain meds before ambulation. She tolerated increased distance fairly well.    Stairs            Wheelchair Mobility    Modified Rankin (Stroke Patients Only)       Balance Overall balance assessment: Needs assistance           Standing balance-Leahy Scale: Fair                      Cognition Arousal/Alertness: Awake/alert Behavior During Therapy: WFL for tasks assessed/performed Overall Cognitive Status: Within Functional Limits for tasks assessed                      Exercises      General Comments        Pertinent Vitals/Pain Pain  Assessment: Faces Faces Pain Scale: Hurts even more Pain Location: abdomen, throat Pain Descriptors / Indicators: Sore Pain Intervention(s): Limited activity within patient's tolerance;Repositioned    Home Living                      Prior Function            PT Goals (current goals can now be found in the care plan section) Progress towards PT goals: Progressing toward goals    Frequency    Min 3X/week      PT Plan Current plan remains appropriate    Co-evaluation             End of Session Equipment Utilized During Treatment: Gait belt Activity Tolerance: Patient tolerated treatment well Patient left: with call bell/phone within reach   PT Visit Diagnosis: Muscle weakness (generalized) (M62.81);Difficulty in walking, not elsewhere classified (R26.2)     Time: 7989-2119 PT Time Calculation (min) (ACUTE ONLY): 22 min  Charges:  $Gait Training: 8-22 mins                    G Codes:       Weston Anna, MPT Pager: [phone]

## 2016-04-06 NOTE — Anesthesia Procedure Notes (Signed)
Procedure Name: MAC Date/Time: 04/06/2016 9:18 AM Performed by: West Pugh Pre-anesthesia Checklist: Patient identified, Emergency Drugs available, Suction available, Patient being monitored and Timeout performed Patient Re-evaluated:Patient Re-evaluated prior to inductionOxygen Delivery Method: Nasal cannula Placement Confirmation: CO2 detector and positive ETCO2 Dental Injury: Teeth and Oropharynx as per pre-operative assessment

## 2016-04-07 LAB — BASIC METABOLIC PANEL
Anion gap: 4 — ABNORMAL LOW (ref 5–15)
BUN: <5 mg/dL — ABNORMAL LOW (ref 6–20)
CALCIUM: 7.9 mg/dL — AB (ref 8.9–10.3)
CHLORIDE: 107 mmol/L (ref 101–111)
CO2: 27 mmol/L (ref 22–32)
CREATININE: 0.34 mg/dL — AB (ref 0.44–1.00)
GFR calc non Af Amer: >60 mL/min (ref >60)
Glucose, Bld: 108 mg/dL — ABNORMAL HIGH (ref 65–99)
Potassium: 3.4 mmol/L — ABNORMAL LOW (ref 3.5–5.1)
Sodium: 138 mmol/L (ref 135–145)

## 2016-04-07 LAB — MAGNESIUM: Magnesium: 1.6 mg/dL — ABNORMAL LOW (ref 1.7–2.4)

## 2016-04-07 MED ORDER — PRO-STAT SUGAR FREE PO LIQD: 30.0000 mL | Freq: Two times a day (BID) | ORAL | 0 refills | Status: DC

## 2016-04-07 MED ORDER — BOOST / RESOURCE BREEZE PO LIQD: 1.0000 | Freq: Two times a day (BID) | ORAL | 0 refills | Status: DC

## 2016-04-07 MED ORDER — POTASSIUM CHLORIDE 20 MEQ PO PACK
40.0000 meq | PACK | Freq: Once | ORAL | Status: DC
  Filled 2016-04-07: qty 2

## 2016-04-07 MED ORDER — METOPROLOL TARTRATE 25 MG PO TABS: 12.5000 mg | ORAL_TABLET | Freq: Two times a day (BID) | ORAL | 0 refills | Status: DC

## 2016-04-07 MED ORDER — MAGNESIUM SULFATE 2 GM/50ML IV SOLN
2.0000 g | Freq: Once | INTRAVENOUS | Status: AC
  Administered 2016-04-07: 2 g via INTRAVENOUS
  Filled 2016-04-07: qty 50

## 2016-04-07 MED ORDER — POTASSIUM CHLORIDE CRYS ER 20 MEQ PO TBCR
40.0000 meq | EXTENDED_RELEASE_TABLET | Freq: Once | ORAL | Status: AC
  Administered 2016-04-07: 40 meq via ORAL
  Filled 2016-04-07: qty 2

## 2016-04-07 NOTE — Care Management Note (Signed)
Case Management Note  Patient Details  Name: [name] Bell MRN: [id number] Date of Birth: [birth year]  Subjective/Objective:   Pt admitted with N/V, Colitis                 Action/Plan: Plan to return home with [place], HHPT and [place] duty 7days a week.   Expected Discharge Date:  04/07/16               Expected Discharge Plan:  [place]  In-House Referral:  Clinical Social Work  Discharge planning Services  CM Consult  Post Acute Care Choice:  NA Choice offered to:  Patient  DME Arranged:    DME Agency:     HH Arranged:  RN, PT [place] Agency:  [place]  Status of Service:  Completed, signed off  If discussed at [place] of Stay Meetings, dates discussed:    Additional CommentsPurcell Mouton, RN 04/07/2016, 12:16 PM

## 2016-04-07 NOTE — Progress Notes (Signed)
[facility] Progress Note  Subjective: Patient tolerated breakfast today very well. She tells me that she had been having trouble swallowing with food sticking but today was fine after the dilation yesterday. Also she has not vomited today which is encouraging post dilatation also of the duodenal stricture.  Objective: Vital signs in last 24 hours: Temp:  [97.8 F (36.6 C)-99.3 F (37.4 C)] 99.3 F (37.4 C) (03/13 0515) Pulse Rate:  [91-103] 95 (03/13 0908) Resp:  [17-24] 20 (03/13 0515) BP: (104-141)/(42-54) 111/45 (03/13 0908) SpO2:  [96 %-100 %] 96 % (03/13 0515) Weight change:    PE:  No distress  Abdomen soft nontender  Lab Results: Results for orders placed or performed during the hospital encounter of 03/31/16 (from the past 24 hour(s))  Basic metabolic panel     Status: Abnormal   Collection Time: 04/07/16  6:40 AM  Result Value Ref Range   Sodium 138 135 - 145 mmol/L   Potassium 3.4 (L) 3.5 - 5.1 mmol/L   Chloride 107 101 - 111 mmol/L   CO2 27 22 - 32 mmol/L   Glucose, Bld 108 (H) 65 - 99 mg/dL   BUN <5 (L) 6 - 20 mg/dL   Creatinine, Ser 0.34 (L) 0.44 - 1.00 mg/dL   Calcium 7.9 (L) 8.9 - 10.3 mg/dL   GFR calc non Af Amer >60 >60 mL/min   GFR calc Af Amer >60 >60 mL/min   Anion gap 4 (L) 5 - 15  Magnesium     Status: Abnormal   Collection Time: 04/07/16  6:40 AM  Result Value Ref Range   Magnesium 1.6 (L) 1.7 - 2.4 mg/dL    Studies/Results: No results found.    Assessment: Esophageal stricture  Duodenal stricture  Both of these were dilated yesterday and today she seems to be doing fine with swallowing and eating  Plan:   From a GI standpoint she can go home. Recommend continued PPI therapy. We will sign off. Call us if needed.    Cassell Clement 04/07/2016, 9:23 AM  Pager: [phone] If no answer or after 5 PM call [phone]

## 2016-04-07 NOTE — Progress Notes (Signed)
CSW spoke with patient at bedside. Patient confirmed plan to return home instead of going to SNF. CSW signing off, please consult if new needs arise.   Abundio Miu, [place] Social Worker [facility] Cell#: [phone]

## 2016-04-07 NOTE — Progress Notes (Signed)
Patient is a&ox4, ambulatory with cane. No change to am assessment. Pt reports having all belongings.medication, Wellbutrin returned. Discharge instructions reviewed, questions concerns denied. Home health needs have been met.

## 2016-04-07 NOTE — Progress Notes (Signed)
[facility] called to transport pt home/pt's request.

## 2016-04-07 NOTE — Discharge Summary (Signed)
Discharge Summary  [name] Bell IBB:[id number] DOB: 05/14/1945  PCP: Albin Felling, MD  Admit date: 03/31/2016 Discharge date: 04/07/2016  Time spent: >38mins, back to ALF with home health  Recommendations for Outpatient Follow-up:  1. F/u with PMD within a week  for hospital discharge follow up, repeat cbc/bmp at follow up 2. F/u with [facility] for esophageal and duodenal stenosis  Discharge Diagnoses:  Active Hospital Problems   Diagnosis Date Noted  . Colitis 09/29/2012  . Malnutrition of moderate degree 04/04/2016  . Small bowel obstruction 03/31/2016  . Nausea and vomiting 03/31/2016  . Hypokalemia 03/31/2016  . Colostomy in place 10/27/2011    Resolved Hospital Problems   Diagnosis Date Noted Date Resolved  No resolved problems to display.    Discharge Condition: stable  Diet recommendation: heart healthy  Filed Weights   03/31/16 2113 03/31/16 2242  Weight: 55 kg (121 lb 4.1 oz) 52.6 kg (115 lb 14.4 oz)    History of present illness:  PCP: Albin Felling, MD   Patient coming from: Home  Chief Complaint: Nausea, vomiting, increased ostomy output  HPI: [name] Bell is a 71 y.o. woman with a history of blindness secondary to macular degeneration, bleeding duodenal ulcer, ischemic colitis, S/P sigmoid colectomy with LLQ colostomy who presents to the ED for evaluation of three weeks of intermittent nausea, vomiting, and increased ostomy output.  Patient has intermittently tolerated some PO intake while using phenergan for nausea.  She was prompted to present to the ED for increased emesis in the past 1-2 days.  She cannot see what it looks like, but her home health aide told her that it appeared dark "like feces", which was concerning.  No documented fever but she has had chills and sweats.  She has had light-headedness, generalized weakness, and increased fatigue.  No syncope.  ED Course: The patient has evidence of hypokalemia and alkalosis in the setting of  diarrhea.  Magnesium level 1.8.  Normal lipase level.  Low albumin level; otherwise, LFTs unremarkable.  CBC essentially normal.  U/A shows moderate leukocytes, 6-30 WBC, but rare bacteria.  CT of the abdomen and pelvis concerning for a loop of transverse colon extending into the ostomy and possibly causing partial obstruction of the right colon.  She also has edema and thickening of the left colon which could represent ischemia or colitis.  General surgery called from the ED.  The patient received a NS bolus and 38mEq of KCl.  Hospitalist asked to admit.  Hospital Course:  Principal Problem:   Colitis Active Problems:   Colostomy in place   Small bowel obstruction   Nausea and vomiting   Hypokalemia   Malnutrition of moderate degree   Acute colitis with possible bowel obstruction, history of colostomy/stable parastonal hernia -- MS contin by mouth continued since she has opioid dependence. --IV cipro and flagyl initially, changed to iv unasyn, she finished total of 7days abx treatment in the hospital.. --she received NS @ 125cc/hr since admission, ivf stopped on 3/8, restart ivf on 3/9 due to remain poor oral intake, low normal bp. She is now tolerating diet at discharge. --general surgery following, no indication for surgery.  Persistent N/V,  (per chart review patient has a history of gi outlet obstruction required EGD dilation in 2009) --[facility] consulted, upper gi serious "1. Diffusely prominently thickened gastric folds, most prominent in the proximal body of the stomach. This is a nonspecific finding with a broad differential including H pylori gastritis, Menetrier's disease  or lymphoma. 2. Mild esophageal dysmotility. 3. Otherwise unremarkable limited water-soluble upper GI. 4. Nonobstructive bowel gas pattern on scout radiograph of the Abdomen. -she has been on protoninc bid since admission, sulcralfate added on 3/9 -EGD on 3/12,s/p dilation of the esophageal stenosis and  duodenal stenosis. Also +Erythematous mucosa in the stomach. S/p biopsy -she tolerated diet advancement, she is cleared to discharge home on ppi, she is to follow up with [facility].  History of bleeding duodenal ulcer, question of coffee ground emesis --she is started on IV PPI q12h since admission -egd on 3/12, +Erythematous mucosa in the stomach, no bleeding ulcer -she is discharged on ppi, and follow up with [facility]  Hypokalemia/hypomagnesemia --replace k/mag  Abnormal U/A without LUTS --Culture no growth --already on abx   NSVT: 5beats around 9am on 3/7, 6beats of svt on 3/7 9pm,  bp borderline low, home meds coreg resumed,  Echocardiogram exam is limited but lvef wnl. , keep k>4, mag>2 tsh 2.3  Coreg changed to low dose lopressor at discharge.   Chronic productive cough, prior smoker, cxr on 2/20 with chronic bronchitis changes, currently on room air, no respiratory distress, repeat chest x ray, again no acute findings, start on mucinex, already on abx Cough resolved at discharge   severe (moderate) malnutrition in context of chronic illness Body mass index is 19.89 kg/m. Nutrition input appreciated  Macular degeneration: poor vision, legally blind, fall precaution, she declined SNF, home with home health  H/o headache, report night time nortriptyline has helped, will resume at discharge  She has been on wellbutrin for years, she has not been taking for about a month, she agreed to stop wellbutrin.   DVT prophylaxis:SCDs Code Status:FULL Family Communication:Patient alone , report no immediate family Disposition Plan:return to ALF with home health with  GI clearance Consults called: General Surgery  [facility]  Procedures:  EGD on 3/12 s/p esophageal stenosis dilation, s/p duodenal stenosis dilation.  Antibiotics:  Cipro/flagyl on 3/6  unasyn from 3/7 to 3/13   Discharge Exam: BP (!) 111/45   Pulse 95   Temp 99.3 F (37.4 C) (Oral)    Resp 20   Ht 5\' 4"  (1.626 m)   Wt 52.6 kg (115 lb 14.4 oz)   LMP 08/25/1980   SpO2 96%   BMI 19.89 kg/m    General:  Frail, legally blind at baseline for years  Cardiovascular: RRR  Respiratory: CTABL  Abdomen: Soft/ND/NT, positive BS, Ostomy to LLQ. Liquid brown stool in the bag.  Musculoskeletal: No Edema  Neuro: aaox3    Discharge Instructions You were cared for by a hospitalist during your hospital stay. If you have any questions about your discharge medications or the care you received while you were in the hospital after you are discharged, you can call the unit and asked to speak with the hospitalist on call if the hospitalist that took care of you is not available. Once you are discharged, your primary care physician will handle any further medical issues. Please note that NO REFILLS for any discharge medications will be authorized once you are discharged, as it is imperative that you return to your primary care physician (or establish a relationship with a primary care physician if you do not have one) for your aftercare needs so that they can reassess your need for medications and monitor your lab values.  Discharge Instructions    Diet - low sodium heart healthy    Complete by:  As directed  Face-to-face encounter (required for Medicare/Medicaid patients)    Complete by:  As directed    I Henny Strauch certify that this patient is under my care and that I, or a nurse practitioner or physician's assistant working with me, had a face-to-face encounter that meets the physician face-to-face encounter requirements with this patient on 04/07/2016. The encounter with the patient was in whole, or in part for the following medical condition(s) which is the primary reason for home health care (List medical condition): FTT, legally blind   The encounter with the patient was in whole, or in part, for the following medical condition, which is the primary reason for home health care:  FTT    I certify that, based on my findings, the following services are medically necessary home health services:   Nursing Physical therapy     Reason for Medically Necessary Home Health Services:  Skilled Nursing- Change/Decline in Patient Status   My clinical findings support the need for the above services:  Unable to leave home safely without assistance and/or assistive device   Further, I certify that my clinical findings support that this patient is homebound due to:  Unable to leave home safely without assistance   Home Health    Complete by:  As directed    To provide the following care/treatments:   PT OT [place] work     Increase activity slowly    Complete by:  As directed      Allergies as of 04/07/2016      Reactions   Ibuprofen Other (See Comments)   REACTION: Bleeding ulcers   Nsaids Other (See Comments)   REACTION: Bleeding Ulcer   Latex Itching      Medication List    STOP taking these medications   buPROPion 100 MG tablet Commonly known as:  WELLBUTRIN   carvedilol 3.125 MG tablet Commonly known as:  COREG     TAKE these medications   albuterol (2.5 MG/3ML) 0.083% nebulizer solution Commonly known as:  PROVENTIL Take 3 mLs (2.5 mg total) by nebulization every 6 (six) hours as needed for wheezing or shortness of breath.   albuterol 108 (90 Base) MCG/ACT inhaler Commonly known as:  PROVENTIL HFA;VENTOLIN HFA Inhale 2 puffs into the lungs every 6 (six) hours as needed for wheezing or shortness of breath.   alendronate 70 MG tablet Commonly known as:  FOSAMAX Take 1 tablet (70 mg total) by mouth once a week. Take with a full glass of water on an empty stomach.   diphenhydrAMINE 25 MG tablet Commonly known as:  BENADRYL Take 25 mg by mouth every 6 (six) hours as needed for allergies.   docusate sodium 100 MG capsule Commonly known as:  COLACE Take 2 capsules (200 mg total) by mouth daily.   esomeprazole 40 MG capsule Commonly known  as:  NEXIUM Take 1 capsule (40 mg total) by mouth 2 (two) times daily before a meal.   feeding supplement (PRO-STAT SUGAR FREE 64) Liqd Take 30 mLs by mouth 2 (two) times daily.   feeding supplement Liqd Take 1 Container by mouth 2 (two) times daily between meals.   fluticasone 50 MCG/ACT nasal spray Commonly known as:  FLONASE Place 2 sprays into both nostrils daily.   HYDROcodone-acetaminophen 7.5-325 MG tablet Commonly known as:  NORCO Take 1 tablet by mouth every 8 (eight) hours as needed for moderate pain. Refill 30 days from last refill   methocarbamol 500 MG tablet Commonly known as:  ROBAXIN Take 1 tablet (500 mg total) by mouth every 6 (six) hours as needed for muscle spasms.   metoprolol tartrate 25 MG tablet Commonly known as:  LOPRESSOR Take 0.5 tablets (12.5 mg total) by mouth 2 (two) times daily.   morphine 15 MG 12 hr tablet Commonly known as:  MS CONTIN Take 2 tablets (30 mg total) by mouth every 12 (twelve) hours. Refill 30 days from last refill   nortriptyline 75 MG capsule Commonly known as:  PAMELOR Take 1 capsule (75 mg total) by mouth at bedtime.   polyethylene glycol packet Commonly known as:  MIRALAX / GLYCOLAX Take 17 g by mouth daily as needed. What changed:  reasons to take this   pravastatin 80 MG tablet Commonly known as:  PRAVACHOL Take 1 tablet (80 mg total) by mouth daily.   promethazine 12.5 MG tablet Commonly known as:  PHENERGAN Take 1 tablet (12.5 mg total) by mouth every 6 (six) hours as needed for nausea or vomiting.   senna 8.6 MG Tabs tablet Commonly known as:  SENOKOT Take 2 tablets (17.2 mg total) by mouth at bedtime as needed for mild constipation.      Allergies  Allergen Reactions  . Ibuprofen Other (See Comments)    REACTION: Bleeding ulcers  . Nsaids Other (See Comments)    REACTION: Bleeding Ulcer  . Latex Itching   Follow-up Information    Albin Felling, MD Follow up in 1 week(s).   Specialty:  Internal  Medicine Why:  hospital discharge follow up, repeat cbc/bmp at follow up Contact information: [address] [place] [address] [phone]        MAGOD,MARC E, MD Follow up in 1 month(s).   Specialty:  Gastroenterology Why:  esophageal stenosis and duodenal stenosis gastritis, follow up biopsy result. Contact information: [address] [place] [address] [phone]            The results of significant diagnostics from this hospitalization (including imaging, microbiology, ancillary and laboratory) are listed below for reference.    Significant Diagnostic Studies: Dg Chest 2 View  Result Date: 03/18/2016 CLINICAL DATA:  Shortness of breath, productive cough EXAM: CHEST  2 VIEW COMPARISON:  09/02/2015 and 02/05/2015 FINDINGS: Cardiomediastinal silhouette is stable. Again noted hyperinflation and chronic mild emphysematous changes bilaterally. Stable scarring right lower lobe laterally. No superimposed infiltrate or pulmonary edema. Mild elevation of the right hemidiaphragm again noted. Osteopenia and mild degenerative changes thoracic spine. Stable chronic mild bronchitic changes IMPRESSION: No active cardiopulmonary disease. Stable hyperinflation and chronic mild emphysematous changes bilaterally. Osteopenia and mild degenerative changes thoracic spine. Chronic mild bronchitic changes again noted. Electronically Signed   By: Lahoma Crocker M.D.   On: 03/18/2016 08:13   Ct Abdomen Pelvis W Contrast  Result Date: 03/31/2016 CLINICAL DATA:  Left-sided abdominal pain. Nausea vomiting diarrhea 3 weeks. History of colostomy EXAM: CT ABDOMEN AND PELVIS WITH CONTRAST TECHNIQUE: Multidetector CT imaging of the abdomen and pelvis was performed using the standard protocol following bolus administration of intravenous contrast. CONTRAST:  98ml ISOVUE-300 IOPAMIDOL (ISOVUE-300) INJECTION 61% COMPARISON:  CT abdomen pelvis 09/02/2015 FINDINGS: Lower chest: Mild scarring in the  lung bases. No infiltrate or effusion. Hepatobiliary: Low-density throughout the liver compatible with extensive fatty infiltration. Small gallstones again noted without gallbladder wall thickening or biliary dilatation Pancreas: Pancreatic atrophy without acute abnormality. Spleen: Negative Adrenals/Urinary Tract: Negative Stomach/Bowel: Normal stomach. Small bowel nondilated. Left colectomy with apparent mucous fistula in the rectum. Left colostomy  in the left abdomen. There is a peristomal hernia containing a loop of transverse colon. Right and transverse colon are mildly dilated and could be partially obstructed. There is no fluid or edema in the hernia sac. There is thickening of the descending colon which could be due to colitis. This bowel extends into the ostomy. Vascular/Lymphatic: Extensive atherosclerotic disease.  No aneurysm. Reproductive: Hysterectomy.  No pelvic mass Other: No free fluid or abscess. Musculoskeletal: Chronic compression fracture T12 is unchanged. No acute skeletal abnormality. IMPRESSION: Left colectomy and left colostomy. Peristomal hernia again noted which is chronic. There is a loop of transverse colon extending into the ostomy which is dilated and could be causing partial obstruction of the right colon. Small bowel nondilated. There is edema and thickening of the left colon which could represent ischemia or colitis. This loop extends into the ostomy and appears edematous within the ostomy. Severe fatty infiltration liver.  Cholelithiasis. Severe atherosclerotic disease Electronically Signed   By: Franchot Gallo M.D.   On: 03/31/2016 19:08   Dg Chest Port 1 View  Result Date: 04/02/2016 CLINICAL DATA:  Generalized weakness, cough. EXAM: PORTABLE CHEST 1 VIEW COMPARISON:  Radiographs of March 17, 2016. FINDINGS: The heart size and mediastinal contours are within normal limits. Both lungs are clear. Atherosclerosis of thoracic aorta is noted. No pneumothorax or pleural effusion  is noted. The visualized skeletal structures are unremarkable. IMPRESSION: No acute cardiopulmonary abnormality seen.  Aortic atherosclerosis. Electronically Signed   By: Marijo Conception, M.D.   On: 04/02/2016 16:25   Dg Duanne Limerick W/water Sol Cm  Result Date: 04/03/2016 CLINICAL DATA:  71 year old female inpatient with a history of left hemicolectomy with end colostomy complicated by parastomal hernia containing the distal transverse colon, admitted with nausea and vomiting. EXAM: WATER SOLUBLE UPPER GI SERIES TECHNIQUE: Single-column upper GI series was performed using water soluble contrast. CONTRAST:  80 cc enterovue 300 PO COMPARISON:  03/31/2016 CT abdomen/pelvis. FLUOROSCOPY TIME:  Fluoroscopy Time:  2 minutes 36 seconds Radiation Exposure Index (if provided by the fluoroscopic device): 20.5 mGy Number of Acquired Spot Images: 3 FINDINGS: No dilated small bowel loops on the scout radiograph. Mild stool and gas throughout the colon. No evidence of pneumatosis or pneumoperitoneum. No pathologic soft tissue calcifications. Upper GI is significantly limited by patient's generalized weakness and limited mobility. In particular, patient unable to tolerate supine or prone positioning and unable to stand upright. Examination performed in partial elevation. Esophageal distensibility appears grossly normal. Mild esophageal dysmotility characterized by proximal escape of the barium bolus and mild intermittent weakening of primary peristalsis in the mid to lower thoracic esophagus. No hiatal hernia. No gross esophageal mass, stricture or ulcer. Unable to assess for gastroesophageal reflux given mobility limitations. There are diffusely prominently thickened folds throughout the stomach, most prominent in the proximal body of the stomach. No discrete gastric mass. Normal gastric emptying. Limited views of the duodenum demonstrate normal duodenal sweep with no duodenal fold thickening or stricture. No gross gastric or duodenal  ulcer. Normal location of the gastrojejunal junction. IMPRESSION: 1. Diffusely prominently thickened gastric folds, most prominent in the proximal body of the stomach. This is a nonspecific finding with a broad differential including H pylori gastritis, Menetrier's disease or lymphoma. 2. Mild esophageal dysmotility. 3. Otherwise unremarkable limited water-soluble upper GI. 4. Nonobstructive bowel gas pattern on scout radiograph of the abdomen. Electronically Signed   By: Ilona Sorrel M.D.   On: 04/03/2016 09:25    Microbiology: Recent Results (from the  past 240 hour(s))  Urine culture     Status: Abnormal   Collection Time: 03/31/16  4:00 PM  Result Value Ref Range Status   Specimen Description URINE, CLEAN CATCH  Final   Special Requests NONE  Final   Culture MULTIPLE SPECIES PRESENT, SUGGEST RECOLLECTION (A)  Final   Report Status 04/02/2016 FINAL  Final  MRSA culture     Status: None   Collection Time: 03/31/16 11:38 PM  Result Value Ref Range Status   Specimen Description NOSE  Final   Special Requests NONE  Final   Culture   Final    NO MRSA DETECTED Performed at [facility], [place] [address] [place], [place] [address]    Report Status 04/01/2016 FINAL  Final  MRSA PCR Screening     Status: Abnormal   Collection Time: 03/31/16 11:39 PM  Result Value Ref Range Status   MRSA by PCR INVALID RESULTS, SPECIMEN SENT FOR CULTURE (A) NEGATIVE Final    Comment:        The GeneXpert MRSA Assay (FDA approved for NASAL specimens only), is one component of a comprehensive MRSA colonization surveillance program. It is not intended to diagnose MRSA infection nor to guide or monitor treatment for MRSA infections. RESULT CALLED TO, READ BACK BY AND VERIFIED WITH: M STEFFENS RN @ [phone] ON 04/01/16 BY C DAVIS      Labs: Basic Metabolic Panel:  Recent Labs Lab 03/31/16 1618  04/01/16 1108  04/03/16 0606 04/04/16 0547 04/05/16 0531 04/06/16 0521 04/07/16 0640  NA 135  < >   --   < > 139 138 142 142 138  K 2.9*  < >  --   < > 3.7 2.9* 3.6 3.5 3.4*  CL 91*  < >  --   < > 107 106 110 110 107  CO2 34*  < >  --   < > 27 27 26 28 27   GLUCOSE 109*  < >  --   < > 97 123* 101* 97 108*  BUN 10  < >  --   < > <5* <5* <5* <5* <5*  CREATININE 0.58  < >  --   < > 0.41* 0.47 0.36* <0.30* 0.34*  CALCIUM 8.7*  < >  --   < > 7.1* 7.1* 7.3* 7.7* 7.9*  MG 1.8  --  2.0  --   --  1.8  --  1.8 1.6*  < > = values in this interval not displayed. Liver Function Tests:  Recent Labs Lab 03/31/16 1618 04/03/16 0606 04/04/16 0547 04/05/16 0531  AST 51* 42* 40 41  ALT 21 19 19 19   ALKPHOS 114 93 88 93  BILITOT 0.9 0.7 0.6 0.5  PROT 5.2* 4.4* 4.2* 4.8*  ALBUMIN 2.3* 1.9* 1.8* 2.0*    Recent Labs Lab 03/31/16 1618  LIPASE 17   No results for input(s): AMMONIA in the last 168 hours. CBC:  Recent Labs Lab 03/31/16 1618 04/01/16 0653 04/02/16 0625 04/03/16 0606 04/06/16 0521  WBC 8.4 9.8 9.0 9.8 8.4  NEUTROABS 4.9  --  5.9 5.5  --   HGB 15.5* 16.7* 13.6 14.0 12.2  HCT 44.9 47.0* 40.5 40.6 34.7*  MCV 90.3 91.6 92.0 92.1 91.8  PLT 236 206 226 205 198   Cardiac Enzymes: No results for input(s): CKTOTAL, CKMB, CKMBINDEX, TROPONINI in the last 168 hours. BNP: BNP (last 3 results) No results for input(s): BNP in the last 8760 hours.  ProBNP (last  3 results) No results for input(s): PROBNP in the last 8760 hours.  CBG: No results for input(s): GLUCAP in the last 168 hours.     SignedFlorencia Reasons MD, PhD  [facility] 04/07/2016, 12:24 PM

## 2016-04-08 ENCOUNTER — Encounter (HOSPITAL_COMMUNITY): Payer: Self-pay | Admitting: Gastroenterology

## 2016-04-14 ENCOUNTER — Telehealth: Payer: Self-pay | Admitting: *Deleted

## 2016-04-14 NOTE — Telephone Encounter (Signed)
Received call from Tia Masker who is the service coordinator at where patient resides.  She states patient contacted her about needing some help.  Pt was discharged from the hospital on 03/13/208 and told that someone from [facility] will be coming out to help her.  Ms Janell Quiet contacted [facility] on pt's behalf and was informed that there is no active orders file for patient.  I informed patient and Ms Janell Quiet that I would speak to our care coordinator to see if she can facilitate the referral and contact both her and Ms Janell Quiet by the end of the day.Regenia Skeeter, Ainsleigh Kakos Cassady3/20/201812:21 PM

## 2016-04-14 NOTE — Telephone Encounter (Signed)
Call made to [facility] to follow up the status of the home health referral-[facility] stated that they received orders today on this patient.  [facility] was informed that pt was discharged from hospital on 03/13 and needs to have services started asap as pt is visually impaired, lives alone, and has a colostomy bag.  [facility] will review referral and contact pt with start of service date.  I attempted to contact both patient and Carloyn Manner answer, message left on both recorders.Despina Hidden Cassady3/20/20184:43 PM

## 2016-04-16 ENCOUNTER — Ambulatory Visit (INDEPENDENT_AMBULATORY_CARE_PROVIDER_SITE_OTHER): Payer: Medicare Other | Admitting: Internal Medicine

## 2016-04-16 ENCOUNTER — Encounter: Payer: Self-pay | Admitting: Internal Medicine

## 2016-04-16 VITALS — BP 90/76 | HR 93 | Temp 98.3°F | Ht 62.0 in | Wt 126.4 lb

## 2016-04-16 DIAGNOSIS — Z79899 Other long term (current) drug therapy: Secondary | ICD-10-CM

## 2016-04-16 DIAGNOSIS — K269 Duodenal ulcer, unspecified as acute or chronic, without hemorrhage or perforation: Secondary | ICD-10-CM | POA: Diagnosis not present

## 2016-04-16 DIAGNOSIS — E876 Hypokalemia: Secondary | ICD-10-CM | POA: Diagnosis not present

## 2016-04-16 NOTE — Progress Notes (Signed)
   CC: colitis f/u  HPI:  [name] is a 71 y.o. past medical history as outlined below who presents to clinic for follow-up of her colitis. She was recently hospitalized from March 8 to the 13th for colitis and SBO. She feels well and is not having any n/v since discharge.   Past Medical History:  Diagnosis Date  . Acute sinusitis 06/30/2011  . Anxiety   . Basal cell carcinoma    "left cheek"  . Bleeding ulcer   . Blind in both eyes   . Chronic back pain   . Degenerative disk disease    This is interscapular, mild compression frx of T12 superior endplate with Schmorl's node. Seen on CT in 2/08  . Depression   . Duodenal ulcer 11/08   With hemorrhage and obstruction  . History of blood transfusion    "related to bleeding from intestines and vomiting blood"  . Ischemic colitis ([place]) 07/30/2011   2009 by endoscopy   . Macular degeneration, wet ([place])   . Osteomyelitis, jaw acute 11/08   Started while in the hospital abcess showed GNR . SP debridement by Dr. Lilli Few,  Priscella Mann S Bovis sp 4  weeks of Pen V started on 05/19/07  with additional 2 weeks in 07/04/07  . Perforated bowel [facility])    surgery july 2013  . Superior mesenteric artery syndrome ([place]) 11/08   sp dilation by Dr. Watt Climes, Pt may require bowel resetion if sx recur    Review of Systems:  Denies n/v, changes in colostomy output, dizziness, lightheadedness, muscle cramps. She is tolerating po intake despite improving soreness from her esophageal and duodenal stricture dilation.   Physical Exam:  Vitals:   04/16/16 1440  BP: 90/76  Pulse: 93  Temp: 98.3 F (36.8 C)  TempSrc: Oral  SpO2: 96%  Weight: 126 lb 6.4 oz (57.3 kg)  Height: 5\' 2"  (1.575 m)   Physical Exam  Constitutional: appears well-developed and well-nourished. No distress.  HENT:  Head: Normocephalic and atraumatic.  Nose: Nose normal.  Cardiovascular: Normal rate, regular rhythm and normal heart sounds.  Exam reveals no gallop and no friction rub.    No murmur heard. Pulmonary/Chest: Effort normal and breath sounds normal. No respiratory distress.  has no wheezes.no rales.  Abdominal: Soft. Bowel sounds are normal.  exhibits no distension. There is no tenderness. There is no rebound and no guarding.  Neurological: alert and oriented to person, place, and time.  Skin: Skin is warm and dry. No rash noted.  not diaphoretic. No erythema. No pallor.    Assessment & Plan:   See Encounters Tab for problem based charting.  Patient discussed with Dr. Angelia Mould

## 2016-04-17 ENCOUNTER — Other Ambulatory Visit: Payer: Self-pay | Admitting: Pharmacist

## 2016-04-17 LAB — BMP8+ANION GAP
Anion Gap: 19 mmol/L — ABNORMAL HIGH (ref 10.0–18.0)
BUN / CREAT RATIO: 8 — AB (ref 12–28)
BUN: 4 mg/dL — AB (ref 8–27)
CALCIUM: 9.4 mg/dL (ref 8.7–10.3)
CO2: 30 mmol/L — ABNORMAL HIGH (ref 18–29)
CREATININE: 0.53 mg/dL — AB (ref 0.57–1.00)
Chloride: 95 mmol/L — ABNORMAL LOW (ref 96–106)
GFR calc non Af Amer: 95 mL/min/{1.73_m2} (ref >59)
GFR, EST AFRICAN AMERICAN: 110 mL/min/{1.73_m2} (ref >59)
Glucose: 110 mg/dL — ABNORMAL HIGH (ref 65–99)
Potassium: 2.6 mmol/L — ABNORMAL LOW (ref 3.5–5.2)
Sodium: 144 mmol/L (ref 134–144)

## 2016-04-17 LAB — MAGNESIUM: Magnesium: 1.8 mg/dL (ref 1.6–2.3)

## 2016-04-17 MED ORDER — POTASSIUM CHLORIDE 20 MEQ/15ML (10%) PO SOLN: ORAL | 0 refills | Status: DC

## 2016-04-17 NOTE — Assessment & Plan Note (Addendum)
A: Pt recently discharged from hospital on March 13th after being treated with abx and IVFs for colitis and SBO. She also had dilation of esophageal and duodenal strictures. Her potassium was low on admission and 3.4 on discharge. She also had low mag on admission. She is on Nexium for her duodenal ulcer which could be causing her low Mag and thus low K. She is not having any sx of hypokalmia.   P: BMET reveals low K of 2.6. She is unable to tolerate KDur pills even when crushed with apple sauce. Pharmacy was able to start patient on KCl 69mEq liquid solution for 14meq BID then 50meq daily after. F/u in 1 week to check K. Front desk messaged to call pt for appt.

## 2016-04-18 DIAGNOSIS — H3530 Unspecified macular degeneration: Secondary | ICD-10-CM | POA: Diagnosis not present

## 2016-04-18 DIAGNOSIS — E785 Hyperlipidemia, unspecified: Secondary | ICD-10-CM | POA: Diagnosis not present

## 2016-04-18 DIAGNOSIS — J449 Chronic obstructive pulmonary disease, unspecified: Secondary | ICD-10-CM | POA: Diagnosis not present

## 2016-04-18 DIAGNOSIS — E440 Moderate protein-calorie malnutrition: Secondary | ICD-10-CM | POA: Diagnosis not present

## 2016-04-18 DIAGNOSIS — M810 Age-related osteoporosis without current pathological fracture: Secondary | ICD-10-CM | POA: Diagnosis not present

## 2016-04-18 DIAGNOSIS — I10 Essential (primary) hypertension: Secondary | ICD-10-CM | POA: Diagnosis not present

## 2016-04-18 DIAGNOSIS — Z433 Encounter for attention to colostomy: Secondary | ICD-10-CM | POA: Diagnosis not present

## 2016-04-20 DIAGNOSIS — E785 Hyperlipidemia, unspecified: Secondary | ICD-10-CM | POA: Diagnosis not present

## 2016-04-20 DIAGNOSIS — H3530 Unspecified macular degeneration: Secondary | ICD-10-CM | POA: Diagnosis not present

## 2016-04-20 DIAGNOSIS — E440 Moderate protein-calorie malnutrition: Secondary | ICD-10-CM | POA: Diagnosis not present

## 2016-04-20 DIAGNOSIS — I10 Essential (primary) hypertension: Secondary | ICD-10-CM | POA: Diagnosis not present

## 2016-04-20 DIAGNOSIS — Z433 Encounter for attention to colostomy: Secondary | ICD-10-CM | POA: Diagnosis not present

## 2016-04-20 DIAGNOSIS — J449 Chronic obstructive pulmonary disease, unspecified: Secondary | ICD-10-CM | POA: Diagnosis not present

## 2016-04-20 DIAGNOSIS — M810 Age-related osteoporosis without current pathological fracture: Secondary | ICD-10-CM | POA: Diagnosis not present

## 2016-04-20 NOTE — Progress Notes (Signed)
Internal Medicine Clinic Attending  Case discussed with Dr. Truong at the time of the visit.  We reviewed the resident's history and exam and pertinent patient test results.  I agree with the assessment, diagnosis, and plan of care documented in the resident's note.  

## 2016-04-23 DIAGNOSIS — E440 Moderate protein-calorie malnutrition: Secondary | ICD-10-CM | POA: Diagnosis not present

## 2016-04-23 DIAGNOSIS — I10 Essential (primary) hypertension: Secondary | ICD-10-CM | POA: Diagnosis not present

## 2016-04-23 DIAGNOSIS — E785 Hyperlipidemia, unspecified: Secondary | ICD-10-CM | POA: Diagnosis not present

## 2016-04-23 DIAGNOSIS — J449 Chronic obstructive pulmonary disease, unspecified: Secondary | ICD-10-CM | POA: Diagnosis not present

## 2016-04-23 DIAGNOSIS — Z433 Encounter for attention to colostomy: Secondary | ICD-10-CM | POA: Diagnosis not present

## 2016-04-23 DIAGNOSIS — H3530 Unspecified macular degeneration: Secondary | ICD-10-CM | POA: Diagnosis not present

## 2016-04-23 DIAGNOSIS — M810 Age-related osteoporosis without current pathological fracture: Secondary | ICD-10-CM | POA: Diagnosis not present

## 2016-04-27 ENCOUNTER — Other Ambulatory Visit: Payer: Self-pay | Admitting: Internal Medicine

## 2016-04-27 DIAGNOSIS — H3530 Unspecified macular degeneration: Secondary | ICD-10-CM | POA: Diagnosis not present

## 2016-04-27 DIAGNOSIS — E785 Hyperlipidemia, unspecified: Secondary | ICD-10-CM | POA: Diagnosis not present

## 2016-04-27 DIAGNOSIS — I10 Essential (primary) hypertension: Secondary | ICD-10-CM | POA: Diagnosis not present

## 2016-04-27 DIAGNOSIS — Z433 Encounter for attention to colostomy: Secondary | ICD-10-CM | POA: Diagnosis not present

## 2016-04-27 DIAGNOSIS — E876 Hypokalemia: Secondary | ICD-10-CM

## 2016-04-27 DIAGNOSIS — J449 Chronic obstructive pulmonary disease, unspecified: Secondary | ICD-10-CM | POA: Diagnosis not present

## 2016-04-27 DIAGNOSIS — E440 Moderate protein-calorie malnutrition: Secondary | ICD-10-CM | POA: Diagnosis not present

## 2016-04-27 DIAGNOSIS — M810 Age-related osteoporosis without current pathological fracture: Secondary | ICD-10-CM | POA: Diagnosis not present

## 2016-04-28 ENCOUNTER — Other Ambulatory Visit (INDEPENDENT_AMBULATORY_CARE_PROVIDER_SITE_OTHER): Payer: Medicare Other

## 2016-04-28 DIAGNOSIS — E876 Hypokalemia: Secondary | ICD-10-CM | POA: Diagnosis not present

## 2016-04-29 LAB — BMP8+ANION GAP
ANION GAP: 20 mmol/L — AB (ref 10.0–18.0)
BUN/Creatinine Ratio: 13 (ref 12–28)
BUN: 6 mg/dL — ABNORMAL LOW (ref 8–27)
CHLORIDE: 100 mmol/L (ref 96–106)
CO2: 28 mmol/L (ref 18–29)
CREATININE: 0.48 mg/dL — AB (ref 0.57–1.00)
Calcium: 9.4 mg/dL (ref 8.7–10.3)
GFR calc Af Amer: 113 mL/min/{1.73_m2} (ref >59)
GFR calc non Af Amer: 98 mL/min/{1.73_m2} (ref >59)
Glucose: 111 mg/dL — ABNORMAL HIGH (ref 65–99)
Potassium: 3.4 mmol/L — ABNORMAL LOW (ref 3.5–5.2)
Sodium: 148 mmol/L — ABNORMAL HIGH (ref 134–144)

## 2016-04-30 DIAGNOSIS — E785 Hyperlipidemia, unspecified: Secondary | ICD-10-CM | POA: Diagnosis not present

## 2016-04-30 DIAGNOSIS — I10 Essential (primary) hypertension: Secondary | ICD-10-CM | POA: Diagnosis not present

## 2016-04-30 DIAGNOSIS — M810 Age-related osteoporosis without current pathological fracture: Secondary | ICD-10-CM | POA: Diagnosis not present

## 2016-04-30 DIAGNOSIS — E440 Moderate protein-calorie malnutrition: Secondary | ICD-10-CM | POA: Diagnosis not present

## 2016-04-30 DIAGNOSIS — J449 Chronic obstructive pulmonary disease, unspecified: Secondary | ICD-10-CM | POA: Diagnosis not present

## 2016-04-30 DIAGNOSIS — Z433 Encounter for attention to colostomy: Secondary | ICD-10-CM | POA: Diagnosis not present

## 2016-04-30 DIAGNOSIS — H3530 Unspecified macular degeneration: Secondary | ICD-10-CM | POA: Diagnosis not present

## 2016-05-04 ENCOUNTER — Telehealth: Payer: Self-pay

## 2016-05-04 DIAGNOSIS — H3530 Unspecified macular degeneration: Secondary | ICD-10-CM | POA: Diagnosis not present

## 2016-05-04 DIAGNOSIS — J449 Chronic obstructive pulmonary disease, unspecified: Secondary | ICD-10-CM | POA: Diagnosis not present

## 2016-05-04 DIAGNOSIS — E440 Moderate protein-calorie malnutrition: Secondary | ICD-10-CM | POA: Diagnosis not present

## 2016-05-04 DIAGNOSIS — E785 Hyperlipidemia, unspecified: Secondary | ICD-10-CM | POA: Diagnosis not present

## 2016-05-04 DIAGNOSIS — I10 Essential (primary) hypertension: Secondary | ICD-10-CM | POA: Diagnosis not present

## 2016-05-04 DIAGNOSIS — M810 Age-related osteoporosis without current pathological fracture: Secondary | ICD-10-CM | POA: Diagnosis not present

## 2016-05-04 DIAGNOSIS — Z433 Encounter for attention to colostomy: Secondary | ICD-10-CM | POA: Diagnosis not present

## 2016-05-04 NOTE — Telephone Encounter (Signed)
Tatiana from [facility] requesting VO. Please call back.

## 2016-05-06 NOTE — Telephone Encounter (Signed)
VO for PT for  1xweek for 1 wk, 2xweek for 4 weeks given, do you agree?

## 2016-05-06 NOTE — Telephone Encounter (Signed)
[name] Bell from [facility] home [facility] is calling back to speak with a nurse back. Please call back.

## 2016-05-07 ENCOUNTER — Other Ambulatory Visit: Payer: Self-pay | Admitting: Internal Medicine

## 2016-05-07 NOTE — Telephone Encounter (Signed)
Yes I agree. Thanks!

## 2016-05-08 ENCOUNTER — Other Ambulatory Visit: Payer: Self-pay | Admitting: *Deleted

## 2016-05-08 ENCOUNTER — Other Ambulatory Visit: Payer: Self-pay

## 2016-05-08 MED ORDER — PROMETHAZINE HCL 12.5 MG PO TABS: 12.5000 mg | ORAL_TABLET | Freq: Four times a day (QID) | ORAL | 2 refills | Status: DC | PRN

## 2016-05-09 DIAGNOSIS — E785 Hyperlipidemia, unspecified: Secondary | ICD-10-CM | POA: Diagnosis not present

## 2016-05-09 DIAGNOSIS — E440 Moderate protein-calorie malnutrition: Secondary | ICD-10-CM | POA: Diagnosis not present

## 2016-05-09 DIAGNOSIS — M810 Age-related osteoporosis without current pathological fracture: Secondary | ICD-10-CM | POA: Diagnosis not present

## 2016-05-09 DIAGNOSIS — J449 Chronic obstructive pulmonary disease, unspecified: Secondary | ICD-10-CM | POA: Diagnosis not present

## 2016-05-09 DIAGNOSIS — H3530 Unspecified macular degeneration: Secondary | ICD-10-CM | POA: Diagnosis not present

## 2016-05-09 DIAGNOSIS — I10 Essential (primary) hypertension: Secondary | ICD-10-CM | POA: Diagnosis not present

## 2016-05-09 DIAGNOSIS — Z433 Encounter for attention to colostomy: Secondary | ICD-10-CM | POA: Diagnosis not present

## 2016-05-11 ENCOUNTER — Other Ambulatory Visit: Payer: Self-pay | Admitting: Internal Medicine

## 2016-05-12 ENCOUNTER — Telehealth: Payer: Self-pay | Admitting: *Deleted

## 2016-05-12 DIAGNOSIS — M810 Age-related osteoporosis without current pathological fracture: Secondary | ICD-10-CM | POA: Diagnosis not present

## 2016-05-12 DIAGNOSIS — Z433 Encounter for attention to colostomy: Secondary | ICD-10-CM | POA: Diagnosis not present

## 2016-05-12 DIAGNOSIS — E440 Moderate protein-calorie malnutrition: Secondary | ICD-10-CM | POA: Diagnosis not present

## 2016-05-12 DIAGNOSIS — J449 Chronic obstructive pulmonary disease, unspecified: Secondary | ICD-10-CM | POA: Diagnosis not present

## 2016-05-12 DIAGNOSIS — I10 Essential (primary) hypertension: Secondary | ICD-10-CM | POA: Diagnosis not present

## 2016-05-12 DIAGNOSIS — E785 Hyperlipidemia, unspecified: Secondary | ICD-10-CM | POA: Diagnosis not present

## 2016-05-12 DIAGNOSIS — H3530 Unspecified macular degeneration: Secondary | ICD-10-CM | POA: Diagnosis not present

## 2016-05-12 NOTE — Telephone Encounter (Signed)
Request for KCL approved & sent to pharm. Patient requesting med to be delivered to her. Per pharmacist, delivery will be tomorrow or pick up today. Patient & [facility] PT made aware.

## 2016-05-14 ENCOUNTER — Telehealth: Payer: Self-pay | Admitting: Internal Medicine

## 2016-05-14 DIAGNOSIS — E785 Hyperlipidemia, unspecified: Secondary | ICD-10-CM | POA: Diagnosis not present

## 2016-05-14 DIAGNOSIS — M810 Age-related osteoporosis without current pathological fracture: Secondary | ICD-10-CM | POA: Diagnosis not present

## 2016-05-14 DIAGNOSIS — I10 Essential (primary) hypertension: Secondary | ICD-10-CM | POA: Diagnosis not present

## 2016-05-14 DIAGNOSIS — H3530 Unspecified macular degeneration: Secondary | ICD-10-CM | POA: Diagnosis not present

## 2016-05-14 DIAGNOSIS — E440 Moderate protein-calorie malnutrition: Secondary | ICD-10-CM | POA: Diagnosis not present

## 2016-05-14 DIAGNOSIS — J449 Chronic obstructive pulmonary disease, unspecified: Secondary | ICD-10-CM | POA: Diagnosis not present

## 2016-05-14 DIAGNOSIS — Z433 Encounter for attention to colostomy: Secondary | ICD-10-CM | POA: Diagnosis not present

## 2016-05-14 NOTE — Telephone Encounter (Signed)
[name] Bell from [facility] would like a call back [phone] requesting a VO

## 2016-05-15 ENCOUNTER — Telehealth: Payer: Self-pay

## 2016-05-15 NOTE — Telephone Encounter (Signed)
Yes, I agree 

## 2016-05-15 NOTE — Telephone Encounter (Signed)
[facility] ask for VO for visits 1x for 4weeks, do you agree, this is extended for teaching and managing colostomy, do you agree

## 2016-05-15 NOTE — Telephone Encounter (Signed)
Reports while patient is progressing she continues to have difficulty with ostomy care requesting verbal order to continue home health 1 times per week for 4 weeks.

## 2016-05-16 DIAGNOSIS — E440 Moderate protein-calorie malnutrition: Secondary | ICD-10-CM | POA: Diagnosis not present

## 2016-05-16 DIAGNOSIS — J449 Chronic obstructive pulmonary disease, unspecified: Secondary | ICD-10-CM | POA: Diagnosis not present

## 2016-05-16 DIAGNOSIS — I10 Essential (primary) hypertension: Secondary | ICD-10-CM | POA: Diagnosis not present

## 2016-05-16 DIAGNOSIS — Z433 Encounter for attention to colostomy: Secondary | ICD-10-CM | POA: Diagnosis not present

## 2016-05-16 DIAGNOSIS — M810 Age-related osteoporosis without current pathological fracture: Secondary | ICD-10-CM | POA: Diagnosis not present

## 2016-05-16 DIAGNOSIS — E785 Hyperlipidemia, unspecified: Secondary | ICD-10-CM | POA: Diagnosis not present

## 2016-05-16 DIAGNOSIS — H3530 Unspecified macular degeneration: Secondary | ICD-10-CM | POA: Diagnosis not present

## 2016-05-18 ENCOUNTER — Telehealth: Payer: Self-pay | Admitting: Internal Medicine

## 2016-05-18 DIAGNOSIS — E440 Moderate protein-calorie malnutrition: Secondary | ICD-10-CM | POA: Diagnosis not present

## 2016-05-18 DIAGNOSIS — I10 Essential (primary) hypertension: Secondary | ICD-10-CM | POA: Diagnosis not present

## 2016-05-18 DIAGNOSIS — Z433 Encounter for attention to colostomy: Secondary | ICD-10-CM | POA: Diagnosis not present

## 2016-05-18 DIAGNOSIS — M810 Age-related osteoporosis without current pathological fracture: Secondary | ICD-10-CM | POA: Diagnosis not present

## 2016-05-18 DIAGNOSIS — H3530 Unspecified macular degeneration: Secondary | ICD-10-CM | POA: Diagnosis not present

## 2016-05-18 DIAGNOSIS — J449 Chronic obstructive pulmonary disease, unspecified: Secondary | ICD-10-CM | POA: Diagnosis not present

## 2016-05-18 DIAGNOSIS — E785 Hyperlipidemia, unspecified: Secondary | ICD-10-CM | POA: Diagnosis not present

## 2016-05-18 NOTE — Telephone Encounter (Signed)
Yes, I agree 

## 2016-05-18 NOTE — Telephone Encounter (Signed)
VO given to assist w/ transportation issues, do you agree?

## 2016-05-18 NOTE — Telephone Encounter (Signed)
Needs VO for transportation

## 2016-05-18 NOTE — Telephone Encounter (Signed)
Dr agreed in other note

## 2016-05-20 DIAGNOSIS — E440 Moderate protein-calorie malnutrition: Secondary | ICD-10-CM | POA: Diagnosis not present

## 2016-05-20 DIAGNOSIS — M810 Age-related osteoporosis without current pathological fracture: Secondary | ICD-10-CM | POA: Diagnosis not present

## 2016-05-20 DIAGNOSIS — I10 Essential (primary) hypertension: Secondary | ICD-10-CM | POA: Diagnosis not present

## 2016-05-20 DIAGNOSIS — E785 Hyperlipidemia, unspecified: Secondary | ICD-10-CM | POA: Diagnosis not present

## 2016-05-20 DIAGNOSIS — J449 Chronic obstructive pulmonary disease, unspecified: Secondary | ICD-10-CM | POA: Diagnosis not present

## 2016-05-20 DIAGNOSIS — Z433 Encounter for attention to colostomy: Secondary | ICD-10-CM | POA: Diagnosis not present

## 2016-05-20 DIAGNOSIS — H3530 Unspecified macular degeneration: Secondary | ICD-10-CM | POA: Diagnosis not present

## 2016-05-21 ENCOUNTER — Telehealth: Payer: Self-pay | Admitting: Internal Medicine

## 2016-05-21 DIAGNOSIS — E785 Hyperlipidemia, unspecified: Secondary | ICD-10-CM | POA: Diagnosis not present

## 2016-05-21 DIAGNOSIS — E440 Moderate protein-calorie malnutrition: Secondary | ICD-10-CM | POA: Diagnosis not present

## 2016-05-21 DIAGNOSIS — I10 Essential (primary) hypertension: Secondary | ICD-10-CM | POA: Diagnosis not present

## 2016-05-21 DIAGNOSIS — Z433 Encounter for attention to colostomy: Secondary | ICD-10-CM | POA: Diagnosis not present

## 2016-05-21 DIAGNOSIS — H3530 Unspecified macular degeneration: Secondary | ICD-10-CM | POA: Diagnosis not present

## 2016-05-21 DIAGNOSIS — J449 Chronic obstructive pulmonary disease, unspecified: Secondary | ICD-10-CM | POA: Diagnosis not present

## 2016-05-21 DIAGNOSIS — M810 Age-related osteoporosis without current pathological fracture: Secondary | ICD-10-CM | POA: Diagnosis not present

## 2016-05-21 NOTE — Telephone Encounter (Signed)
APT. REMINDER CALL, LMTCB °

## 2016-05-22 ENCOUNTER — Ambulatory Visit (INDEPENDENT_AMBULATORY_CARE_PROVIDER_SITE_OTHER): Payer: Medicare Other | Admitting: Internal Medicine

## 2016-05-22 ENCOUNTER — Encounter (INDEPENDENT_AMBULATORY_CARE_PROVIDER_SITE_OTHER): Payer: Self-pay

## 2016-05-22 ENCOUNTER — Encounter: Payer: Self-pay | Admitting: Internal Medicine

## 2016-05-22 VITALS — BP 125/66 | HR 105 | Temp 97.9°F | Ht 64.0 in | Wt 121.3 lb

## 2016-05-22 DIAGNOSIS — F329 Major depressive disorder, single episode, unspecified: Secondary | ICD-10-CM

## 2016-05-22 DIAGNOSIS — E876 Hypokalemia: Secondary | ICD-10-CM

## 2016-05-22 DIAGNOSIS — F3342 Major depressive disorder, recurrent, in full remission: Secondary | ICD-10-CM

## 2016-05-22 LAB — BASIC METABOLIC PANEL
Anion gap: 9 (ref 5–15)
BUN: 10 mg/dL (ref 6–20)
CO2: 28 mmol/L (ref 22–32)
Calcium: 9.9 mg/dL (ref 8.9–10.3)
Chloride: 99 mmol/L — ABNORMAL LOW (ref 101–111)
Creatinine, Ser: 0.52 mg/dL (ref 0.44–1.00)
GFR calc Af Amer: >60 mL/min (ref >60)
GFR calc non Af Amer: >60 mL/min (ref >60)
GLUCOSE: 130 mg/dL — AB (ref 65–99)
POTASSIUM: 3.4 mmol/L — AB (ref 3.5–5.1)
Sodium: 136 mmol/L (ref 135–145)

## 2016-05-22 MED ORDER — POTASSIUM CHLORIDE 20 MEQ/15ML (10%) PO SOLN
20.0000 meq | Freq: Two times a day (BID) | ORAL | 0 refills | Status: DC
Start: 2016-05-22 — End: 2016-10-27

## 2016-05-22 NOTE — Patient Instructions (Signed)
General Instructions: - Check potassium level today - Will call you if you need to retake potassium  - Follow up with me on May 29th at 2:15 PM  Thank you for bringing your medicines today. This helps [place] keep you safe from mistakes.   Progress Toward Treatment Goals:  Treatment Goal 05/11/2012  Stop smoking smoking the same amount    Self Care Goals & Plans:  Self Care Goal 01/15/2014  Manage my medications take my medicines as prescribed; bring my medications to every visit; refill my medications on time  Eat healthy foods eat more vegetables; eat foods that are low in salt; eat baked foods instead of fried foods  Be physically active find an activity I enjoy  Stop smoking -    No flowsheet data found.   Care Management & Community Referrals:  Referral 05/31/2013  Referrals made for care management support -  Referrals made to community resources none

## 2016-05-22 NOTE — Progress Notes (Signed)
   CC: Hypokalemia follow up  HPI:  [name] is a 71 y.o. woman with PMHx as noted below who presents today for follow up of hypokalemia.  Hypokalemia: K 2.6 when checked at her hospital follow up visit in March. Potassium then improved to 3.4 with supplementation. She was advised to continue supplementation. She denies any palpitations or muscle cramps.   Depression:  PHQ-9 score is 5 today. She reports her Wellbutrin was stopped when she was hospitalized. She states she feels depressed at times but denies any suicidal ideation. She is willing to try going without Wellbutrin and see if her depression can be managed without medication.   Past Medical History:  Diagnosis Date  . Acute sinusitis 06/30/2011  . Anxiety   . Basal cell carcinoma    "left cheek"  . Bleeding ulcer   . Blind in both eyes   . Chronic back pain   . Degenerative disk disease    This is interscapular, mild compression frx of T12 superior endplate with Schmorl's node. Seen on CT in 2/08  . Depression   . Duodenal ulcer 11/08   With hemorrhage and obstruction  . History of blood transfusion    "related to bleeding from intestines and vomiting blood"  . Ischemic colitis ([place]) 07/30/2011   2009 by endoscopy   . Macular degeneration, wet ([place])   . Osteomyelitis, jaw acute 11/08   Started while in the hospital abcess showed GNR . SP debridement by Dr. Lilli Few,  Priscella Mann S Bovis sp 4  weeks of Pen V started on 05/19/07  with additional 2 weeks in 07/04/07  . Perforated bowel [facility])    surgery july 2013  . Superior mesenteric artery syndrome ([place]) 11/08   sp dilation by Dr. Watt Climes, Pt may require bowel resetion if sx recur    Review of Systems:   All negative except per HPI  Physical Exam:  Vitals:   05/22/16 1412  BP: 125/66  Pulse: (!) 105  Temp: 97.9 F (36.6 C)  TempSrc: Oral  SpO2: 98%  Weight: 121 lb 4.8 oz (55 kg)  Height: 5\' 4"  (1.626 m)   General: Elderly woman in NAD CV: RRR, no m/g/r Abd:  BS+, soft, non-tender, left ostomy in place Ext: no peripheral edema  Assessment & Plan:   See Encounters Tab for problem based charting.  Patient discussed with Dr. Lynnae January

## 2016-05-25 DIAGNOSIS — J449 Chronic obstructive pulmonary disease, unspecified: Secondary | ICD-10-CM | POA: Diagnosis not present

## 2016-05-25 DIAGNOSIS — H3530 Unspecified macular degeneration: Secondary | ICD-10-CM | POA: Diagnosis not present

## 2016-05-25 DIAGNOSIS — M810 Age-related osteoporosis without current pathological fracture: Secondary | ICD-10-CM | POA: Diagnosis not present

## 2016-05-25 DIAGNOSIS — E785 Hyperlipidemia, unspecified: Secondary | ICD-10-CM | POA: Diagnosis not present

## 2016-05-25 DIAGNOSIS — E440 Moderate protein-calorie malnutrition: Secondary | ICD-10-CM | POA: Diagnosis not present

## 2016-05-25 DIAGNOSIS — Z433 Encounter for attention to colostomy: Secondary | ICD-10-CM | POA: Diagnosis not present

## 2016-05-25 DIAGNOSIS — I10 Essential (primary) hypertension: Secondary | ICD-10-CM | POA: Diagnosis not present

## 2016-05-25 NOTE — Assessment & Plan Note (Signed)
K 3.4 today. Will have patient take K-Dur 20 mEq BID. She has a follow up appointment with me in a few weeks and will recheck her potassium level then.

## 2016-05-25 NOTE — Assessment & Plan Note (Signed)
Appears her Wellbutrin was stopped accidentally in the hospital. Her PHQ-9 score is 5 today. Patient reports she would be willing to try going without this medication as it would be one less medicine for her to take. We discussed that we could monitor her depression scale and symptoms closely and restart if needed. She agreed to this plan.

## 2016-05-25 NOTE — Progress Notes (Signed)
Internal Medicine Clinic Attending  Case discussed with Dr. Rivet at the time of the visit.  We reviewed the resident's history and exam and pertinent patient test results.  I agree with the assessment, diagnosis, and plan of care documented in the resident's note.  

## 2016-05-27 DIAGNOSIS — J449 Chronic obstructive pulmonary disease, unspecified: Secondary | ICD-10-CM | POA: Diagnosis not present

## 2016-05-27 DIAGNOSIS — Z433 Encounter for attention to colostomy: Secondary | ICD-10-CM | POA: Diagnosis not present

## 2016-05-27 DIAGNOSIS — I10 Essential (primary) hypertension: Secondary | ICD-10-CM | POA: Diagnosis not present

## 2016-05-27 DIAGNOSIS — E785 Hyperlipidemia, unspecified: Secondary | ICD-10-CM | POA: Diagnosis not present

## 2016-05-27 DIAGNOSIS — E440 Moderate protein-calorie malnutrition: Secondary | ICD-10-CM | POA: Diagnosis not present

## 2016-05-27 DIAGNOSIS — H3530 Unspecified macular degeneration: Secondary | ICD-10-CM | POA: Diagnosis not present

## 2016-05-27 DIAGNOSIS — M810 Age-related osteoporosis without current pathological fracture: Secondary | ICD-10-CM | POA: Diagnosis not present

## 2016-05-29 DIAGNOSIS — M810 Age-related osteoporosis without current pathological fracture: Secondary | ICD-10-CM | POA: Diagnosis not present

## 2016-05-29 DIAGNOSIS — Z433 Encounter for attention to colostomy: Secondary | ICD-10-CM | POA: Diagnosis not present

## 2016-05-29 DIAGNOSIS — E440 Moderate protein-calorie malnutrition: Secondary | ICD-10-CM | POA: Diagnosis not present

## 2016-05-29 DIAGNOSIS — H3530 Unspecified macular degeneration: Secondary | ICD-10-CM | POA: Diagnosis not present

## 2016-05-29 DIAGNOSIS — J449 Chronic obstructive pulmonary disease, unspecified: Secondary | ICD-10-CM | POA: Diagnosis not present

## 2016-05-29 DIAGNOSIS — E785 Hyperlipidemia, unspecified: Secondary | ICD-10-CM | POA: Diagnosis not present

## 2016-05-29 DIAGNOSIS — I10 Essential (primary) hypertension: Secondary | ICD-10-CM | POA: Diagnosis not present

## 2016-06-01 DIAGNOSIS — E440 Moderate protein-calorie malnutrition: Secondary | ICD-10-CM | POA: Diagnosis not present

## 2016-06-01 DIAGNOSIS — M810 Age-related osteoporosis without current pathological fracture: Secondary | ICD-10-CM | POA: Diagnosis not present

## 2016-06-01 DIAGNOSIS — J449 Chronic obstructive pulmonary disease, unspecified: Secondary | ICD-10-CM | POA: Diagnosis not present

## 2016-06-01 DIAGNOSIS — Z433 Encounter for attention to colostomy: Secondary | ICD-10-CM | POA: Diagnosis not present

## 2016-06-01 DIAGNOSIS — H3530 Unspecified macular degeneration: Secondary | ICD-10-CM | POA: Diagnosis not present

## 2016-06-01 DIAGNOSIS — E785 Hyperlipidemia, unspecified: Secondary | ICD-10-CM | POA: Diagnosis not present

## 2016-06-01 DIAGNOSIS — I10 Essential (primary) hypertension: Secondary | ICD-10-CM | POA: Diagnosis not present

## 2016-06-03 ENCOUNTER — Other Ambulatory Visit: Payer: Self-pay | Admitting: *Deleted

## 2016-06-03 DIAGNOSIS — K219 Gastro-esophageal reflux disease without esophagitis: Secondary | ICD-10-CM

## 2016-06-03 MED ORDER — ESOMEPRAZOLE MAGNESIUM 40 MG PO CPDR: 40.0000 mg | DELAYED_RELEASE_CAPSULE | Freq: Two times a day (BID) | ORAL | 2 refills | Status: DC

## 2016-06-04 DIAGNOSIS — H3530 Unspecified macular degeneration: Secondary | ICD-10-CM | POA: Diagnosis not present

## 2016-06-04 DIAGNOSIS — Z433 Encounter for attention to colostomy: Secondary | ICD-10-CM | POA: Diagnosis not present

## 2016-06-04 DIAGNOSIS — I10 Essential (primary) hypertension: Secondary | ICD-10-CM | POA: Diagnosis not present

## 2016-06-04 DIAGNOSIS — E785 Hyperlipidemia, unspecified: Secondary | ICD-10-CM | POA: Diagnosis not present

## 2016-06-04 DIAGNOSIS — M810 Age-related osteoporosis without current pathological fracture: Secondary | ICD-10-CM | POA: Diagnosis not present

## 2016-06-04 DIAGNOSIS — E440 Moderate protein-calorie malnutrition: Secondary | ICD-10-CM | POA: Diagnosis not present

## 2016-06-04 DIAGNOSIS — J449 Chronic obstructive pulmonary disease, unspecified: Secondary | ICD-10-CM | POA: Diagnosis not present

## 2016-06-08 DIAGNOSIS — E785 Hyperlipidemia, unspecified: Secondary | ICD-10-CM | POA: Diagnosis not present

## 2016-06-08 DIAGNOSIS — M810 Age-related osteoporosis without current pathological fracture: Secondary | ICD-10-CM | POA: Diagnosis not present

## 2016-06-08 DIAGNOSIS — H3530 Unspecified macular degeneration: Secondary | ICD-10-CM | POA: Diagnosis not present

## 2016-06-08 DIAGNOSIS — J449 Chronic obstructive pulmonary disease, unspecified: Secondary | ICD-10-CM | POA: Diagnosis not present

## 2016-06-08 DIAGNOSIS — E440 Moderate protein-calorie malnutrition: Secondary | ICD-10-CM | POA: Diagnosis not present

## 2016-06-08 DIAGNOSIS — Z433 Encounter for attention to colostomy: Secondary | ICD-10-CM | POA: Diagnosis not present

## 2016-06-08 DIAGNOSIS — I10 Essential (primary) hypertension: Secondary | ICD-10-CM | POA: Diagnosis not present

## 2016-06-11 ENCOUNTER — Telehealth: Payer: Self-pay | Admitting: Internal Medicine

## 2016-06-11 DIAGNOSIS — J449 Chronic obstructive pulmonary disease, unspecified: Secondary | ICD-10-CM | POA: Diagnosis not present

## 2016-06-11 DIAGNOSIS — I10 Essential (primary) hypertension: Secondary | ICD-10-CM | POA: Diagnosis not present

## 2016-06-11 DIAGNOSIS — E785 Hyperlipidemia, unspecified: Secondary | ICD-10-CM | POA: Diagnosis not present

## 2016-06-11 DIAGNOSIS — E440 Moderate protein-calorie malnutrition: Secondary | ICD-10-CM | POA: Diagnosis not present

## 2016-06-11 DIAGNOSIS — H3530 Unspecified macular degeneration: Secondary | ICD-10-CM | POA: Diagnosis not present

## 2016-06-11 DIAGNOSIS — M810 Age-related osteoporosis without current pathological fracture: Secondary | ICD-10-CM | POA: Diagnosis not present

## 2016-06-11 DIAGNOSIS — Z433 Encounter for attention to colostomy: Secondary | ICD-10-CM | POA: Diagnosis not present

## 2016-06-11 NOTE — Telephone Encounter (Signed)
Lm for rtc 

## 2016-06-11 NOTE — Telephone Encounter (Signed)
GINGER [phone], WANTS TO EXTEND P/T FOR 2 TIMES A WEEK FOR 6 WEEKS

## 2016-06-12 DIAGNOSIS — M810 Age-related osteoporosis without current pathological fracture: Secondary | ICD-10-CM | POA: Diagnosis not present

## 2016-06-12 DIAGNOSIS — J449 Chronic obstructive pulmonary disease, unspecified: Secondary | ICD-10-CM | POA: Diagnosis not present

## 2016-06-12 DIAGNOSIS — E440 Moderate protein-calorie malnutrition: Secondary | ICD-10-CM | POA: Diagnosis not present

## 2016-06-12 DIAGNOSIS — H3530 Unspecified macular degeneration: Secondary | ICD-10-CM | POA: Diagnosis not present

## 2016-06-12 DIAGNOSIS — Z433 Encounter for attention to colostomy: Secondary | ICD-10-CM | POA: Diagnosis not present

## 2016-06-12 DIAGNOSIS — E785 Hyperlipidemia, unspecified: Secondary | ICD-10-CM | POA: Diagnosis not present

## 2016-06-12 DIAGNOSIS — I10 Essential (primary) hypertension: Secondary | ICD-10-CM | POA: Diagnosis not present

## 2016-06-12 NOTE — Telephone Encounter (Signed)
Vo to extend [facility] PT to 2xweek for 6 weeks, do you agree?

## 2016-06-12 NOTE — Telephone Encounter (Signed)
Yes, I agree 

## 2016-06-12 NOTE — Telephone Encounter (Signed)
No answer again this am, will try again

## 2016-06-15 ENCOUNTER — Telehealth: Payer: Self-pay | Admitting: Internal Medicine

## 2016-06-15 DIAGNOSIS — E785 Hyperlipidemia, unspecified: Secondary | ICD-10-CM | POA: Diagnosis not present

## 2016-06-15 DIAGNOSIS — Z433 Encounter for attention to colostomy: Secondary | ICD-10-CM | POA: Diagnosis not present

## 2016-06-15 DIAGNOSIS — J449 Chronic obstructive pulmonary disease, unspecified: Secondary | ICD-10-CM | POA: Diagnosis not present

## 2016-06-15 DIAGNOSIS — E440 Moderate protein-calorie malnutrition: Secondary | ICD-10-CM | POA: Diagnosis not present

## 2016-06-15 DIAGNOSIS — H3530 Unspecified macular degeneration: Secondary | ICD-10-CM | POA: Diagnosis not present

## 2016-06-15 DIAGNOSIS — M810 Age-related osteoporosis without current pathological fracture: Secondary | ICD-10-CM | POA: Diagnosis not present

## 2016-06-15 DIAGNOSIS — I10 Essential (primary) hypertension: Secondary | ICD-10-CM | POA: Diagnosis not present

## 2016-06-15 NOTE — Telephone Encounter (Signed)
FYI forwarded to pcp.Despina Hidden Cassady5/21/20184:53 PM

## 2016-06-15 NOTE — Telephone Encounter (Signed)
[name] Bell FROM [facility] SAID THEY STOPPED SKILLED NURSING SERVICES TODAY, PATIENT DOES NOT NEED, SHE WILL CONTINUE WITH P/T

## 2016-06-17 DIAGNOSIS — M810 Age-related osteoporosis without current pathological fracture: Secondary | ICD-10-CM | POA: Diagnosis not present

## 2016-06-17 DIAGNOSIS — E785 Hyperlipidemia, unspecified: Secondary | ICD-10-CM | POA: Diagnosis not present

## 2016-06-17 DIAGNOSIS — I10 Essential (primary) hypertension: Secondary | ICD-10-CM | POA: Diagnosis not present

## 2016-06-17 DIAGNOSIS — Z433 Encounter for attention to colostomy: Secondary | ICD-10-CM | POA: Diagnosis not present

## 2016-06-17 DIAGNOSIS — J449 Chronic obstructive pulmonary disease, unspecified: Secondary | ICD-10-CM | POA: Diagnosis not present

## 2016-06-17 DIAGNOSIS — E440 Moderate protein-calorie malnutrition: Secondary | ICD-10-CM | POA: Diagnosis not present

## 2016-06-17 DIAGNOSIS — H3530 Unspecified macular degeneration: Secondary | ICD-10-CM | POA: Diagnosis not present

## 2016-06-18 DIAGNOSIS — E785 Hyperlipidemia, unspecified: Secondary | ICD-10-CM | POA: Diagnosis not present

## 2016-06-18 DIAGNOSIS — Z433 Encounter for attention to colostomy: Secondary | ICD-10-CM | POA: Diagnosis not present

## 2016-06-18 DIAGNOSIS — H3530 Unspecified macular degeneration: Secondary | ICD-10-CM | POA: Diagnosis not present

## 2016-06-18 DIAGNOSIS — M6281 Muscle weakness (generalized): Secondary | ICD-10-CM | POA: Diagnosis not present

## 2016-06-18 DIAGNOSIS — M810 Age-related osteoporosis without current pathological fracture: Secondary | ICD-10-CM | POA: Diagnosis not present

## 2016-06-18 DIAGNOSIS — E440 Moderate protein-calorie malnutrition: Secondary | ICD-10-CM | POA: Diagnosis not present

## 2016-06-18 DIAGNOSIS — I10 Essential (primary) hypertension: Secondary | ICD-10-CM | POA: Diagnosis not present

## 2016-06-18 DIAGNOSIS — M549 Dorsalgia, unspecified: Secondary | ICD-10-CM | POA: Diagnosis not present

## 2016-06-18 DIAGNOSIS — J449 Chronic obstructive pulmonary disease, unspecified: Secondary | ICD-10-CM | POA: Diagnosis not present

## 2016-06-22 DIAGNOSIS — H3530 Unspecified macular degeneration: Secondary | ICD-10-CM | POA: Diagnosis not present

## 2016-06-22 DIAGNOSIS — J449 Chronic obstructive pulmonary disease, unspecified: Secondary | ICD-10-CM | POA: Diagnosis not present

## 2016-06-22 DIAGNOSIS — M549 Dorsalgia, unspecified: Secondary | ICD-10-CM | POA: Diagnosis not present

## 2016-06-22 DIAGNOSIS — M6281 Muscle weakness (generalized): Secondary | ICD-10-CM | POA: Diagnosis not present

## 2016-06-22 DIAGNOSIS — Z433 Encounter for attention to colostomy: Secondary | ICD-10-CM | POA: Diagnosis not present

## 2016-06-22 DIAGNOSIS — E440 Moderate protein-calorie malnutrition: Secondary | ICD-10-CM | POA: Diagnosis not present

## 2016-06-22 DIAGNOSIS — M810 Age-related osteoporosis without current pathological fracture: Secondary | ICD-10-CM | POA: Diagnosis not present

## 2016-06-22 DIAGNOSIS — I10 Essential (primary) hypertension: Secondary | ICD-10-CM | POA: Diagnosis not present

## 2016-06-22 DIAGNOSIS — E785 Hyperlipidemia, unspecified: Secondary | ICD-10-CM | POA: Diagnosis not present

## 2016-06-23 ENCOUNTER — Ambulatory Visit (INDEPENDENT_AMBULATORY_CARE_PROVIDER_SITE_OTHER): Payer: Medicare Other | Admitting: Internal Medicine

## 2016-06-23 ENCOUNTER — Encounter: Payer: Self-pay | Admitting: Internal Medicine

## 2016-06-23 VITALS — BP 116/40 | HR 86 | Temp 97.9°F | Ht 64.0 in | Wt 131.9 lb

## 2016-06-23 DIAGNOSIS — K5900 Constipation, unspecified: Secondary | ICD-10-CM

## 2016-06-23 DIAGNOSIS — E876 Hypokalemia: Secondary | ICD-10-CM

## 2016-06-23 DIAGNOSIS — G8929 Other chronic pain: Secondary | ICD-10-CM | POA: Diagnosis not present

## 2016-06-23 DIAGNOSIS — Z87891 Personal history of nicotine dependence: Secondary | ICD-10-CM

## 2016-06-23 DIAGNOSIS — K469 Unspecified abdominal hernia without obstruction or gangrene: Secondary | ICD-10-CM | POA: Diagnosis not present

## 2016-06-23 DIAGNOSIS — M545 Low back pain: Secondary | ICD-10-CM

## 2016-06-23 DIAGNOSIS — Z933 Colostomy status: Secondary | ICD-10-CM | POA: Diagnosis not present

## 2016-06-23 DIAGNOSIS — J449 Chronic obstructive pulmonary disease, unspecified: Secondary | ICD-10-CM | POA: Diagnosis not present

## 2016-06-23 DIAGNOSIS — Z79891 Long term (current) use of opiate analgesic: Secondary | ICD-10-CM | POA: Diagnosis not present

## 2016-06-23 DIAGNOSIS — L659 Nonscarring hair loss, unspecified: Secondary | ICD-10-CM | POA: Diagnosis not present

## 2016-06-23 DIAGNOSIS — R000 Tachycardia, unspecified: Secondary | ICD-10-CM | POA: Diagnosis not present

## 2016-06-23 MED ORDER — HYDROCODONE-ACETAMINOPHEN 7.5-325 MG PO TABS: 1.0000 | ORAL_TABLET | Freq: Three times a day (TID) | ORAL | 0 refills | Status: DC | PRN

## 2016-06-23 MED ORDER — MORPHINE SULFATE ER 15 MG PO TBCR: 30.0000 mg | EXTENDED_RELEASE_TABLET | Freq: Two times a day (BID) | ORAL | 0 refills | Status: DC

## 2016-06-23 MED ORDER — METOPROLOL TARTRATE 25 MG PO TABS: 12.5000 mg | ORAL_TABLET | Freq: Two times a day (BID) | ORAL | 5 refills | Status: DC

## 2016-06-23 NOTE — Patient Instructions (Signed)
General Instructions: - Will check potassium level today. Will let you know if you need to continue this. - Cut down on Miralax. Restart if Senokot and Colace not enough. - Reschedule mammogram - Follow up in 3 months for pain medication  Thank you for bringing your medicines today. This helps [place] keep you safe from mistakes.   Progress Toward Treatment Goals:  Treatment Goal 05/11/2012  Stop smoking smoking the same amount    Self Care Goals & Plans:  Self Care Goal 01/15/2014  Manage my medications take my medicines as prescribed; bring my medications to every visit; refill my medications on time  Eat healthy foods eat more vegetables; eat foods that are low in salt; eat baked foods instead of fried foods  Be physically active find an activity I enjoy  Stop smoking -    No flowsheet data found.   Care Management & Community Referrals:  Referral 05/31/2013  Referrals made for care management support -  Referrals made to community resources none

## 2016-06-23 NOTE — Progress Notes (Signed)
CC: Follow up for hypokalemia  HPI:  [name] Bell is a 71 y.o. woman with PMHx as noted below who presents today for follow up of her hypokalemia.  Hypokalemia: K 3.4 at last visit. She has a hx of hypokalemia possibly related to her PPI. She was instructed to take K-Dur 20 mEq BID. She reports taking potassium supplementation since her last visit. Denies any muscle cramps or palpitations.  COPD: Last PFTs in August 2012 which showed a FEV1/FVC of 55% with significant response to bronchodilators indicating moderately severe obstructive disease. She reports using her albuterol inhaler seldom. She denies any nighttime symptoms.   Chronic back pain and abdominal pain: On chronic opioid therapy for her chronic low back pain and abdominal pain related a large hernia. She is taking MS Contin 30 mg Q12H, Norco 7.5-325 mg every 8 hours as needed (2-3 times per day depending upon pain severity), and Robaxin 500 mg every 6 hours PRN (typically uses 2 times per week). She recently started PT and notes this has been going well. She has become more mobile and feels safer in ambulation since starting PT.   Constipation: Reports she has not been having any issues with constipation. She actually reports having too many bowel movements in a day. She notes at least 3 bowel movements daily. She is taking Colace, Miralax once daily, and Senokot 2 tabs at bedtime.   Hair Loss: Reports that her hair has been falling out over the last month. She feels it is more than normal hair loss. She describes it as "like pulling out tumbleweeds." She has not had this issue before.  Sinus Tachycardia: Reports she needs a refill on her Lopressor today. She denies any palpitations. HR controlled at 86.  Past Medical History:  Diagnosis Date  . Acute sinusitis 06/30/2011  . Anxiety   . Basal cell carcinoma    "left cheek"  . Bleeding ulcer   . Blind in both eyes   . Chronic back pain   . Degenerative disk disease    This is interscapular, mild compression frx of T12 superior endplate with Schmorl's node. Seen on CT in 2/08  . Depression   . Duodenal ulcer 11/08   With hemorrhage and obstruction  . History of blood transfusion    "related to bleeding from intestines and vomiting blood"  . Ischemic colitis ([place]) 07/30/2011   2009 by endoscopy   . Macular degeneration, wet ([place])   . Osteomyelitis, jaw acute 11/08   Started while in the hospital abcess showed GNR . SP debridement by Dr. Lilli Few,  Priscella Mann S Bovis sp 4  weeks of Pen V started on 05/19/07  with additional 2 weeks in 07/04/07  . Perforated bowel [facility])    surgery july 2013  . Superior mesenteric artery syndrome ([place]) 11/08   sp dilation by Dr. Watt Climes, Pt may require bowel resetion if sx recur    Review of Systems:   All negative except per HPI  Physical Exam:  Vitals:   06/23/16 1359  BP: (!) 116/40  Pulse: 86  Temp: 97.9 F (36.6 C)  TempSrc: Oral  SpO2: 96%  Weight: 131 lb 14.4 oz (59.8 kg)  Height: 5\' 4"  (1.626 m)   General: Elderly, well-nourished woman in NAD Head: Normal hair distribution, no evidence for alopecia. CV: RRR, no m/g/r Pulm: CTA bilaterally, breaths non-labored Abd: BS+, soft, non-tender, left-sided ostomy in place  Assessment & Plan:   See Encounters Tab for problem based charting.  Patient discussed with Dr. Angelia Mould

## 2016-06-24 DIAGNOSIS — M6281 Muscle weakness (generalized): Secondary | ICD-10-CM | POA: Diagnosis not present

## 2016-06-24 DIAGNOSIS — I10 Essential (primary) hypertension: Secondary | ICD-10-CM | POA: Diagnosis not present

## 2016-06-24 DIAGNOSIS — E440 Moderate protein-calorie malnutrition: Secondary | ICD-10-CM | POA: Diagnosis not present

## 2016-06-24 DIAGNOSIS — M810 Age-related osteoporosis without current pathological fracture: Secondary | ICD-10-CM | POA: Diagnosis not present

## 2016-06-24 DIAGNOSIS — L659 Nonscarring hair loss, unspecified: Secondary | ICD-10-CM | POA: Insufficient documentation

## 2016-06-24 DIAGNOSIS — Z433 Encounter for attention to colostomy: Secondary | ICD-10-CM | POA: Diagnosis not present

## 2016-06-24 DIAGNOSIS — H3530 Unspecified macular degeneration: Secondary | ICD-10-CM | POA: Diagnosis not present

## 2016-06-24 DIAGNOSIS — J449 Chronic obstructive pulmonary disease, unspecified: Secondary | ICD-10-CM | POA: Diagnosis not present

## 2016-06-24 DIAGNOSIS — M549 Dorsalgia, unspecified: Secondary | ICD-10-CM | POA: Diagnosis not present

## 2016-06-24 DIAGNOSIS — E785 Hyperlipidemia, unspecified: Secondary | ICD-10-CM | POA: Diagnosis not present

## 2016-06-24 HISTORY — DX: Nonscarring hair loss, unspecified: L65.9

## 2016-06-24 LAB — BMP8+ANION GAP
Anion Gap: 13 mmol/L (ref 10.0–18.0)
BUN/Creatinine Ratio: 21 (ref 12–28)
BUN: 13 mg/dL (ref 8–27)
CALCIUM: 9.3 mg/dL (ref 8.7–10.3)
CHLORIDE: 100 mmol/L (ref 96–106)
CO2: 25 mmol/L (ref 18–29)
CREATININE: 0.61 mg/dL (ref 0.57–1.00)
GFR calc Af Amer: 105 mL/min/{1.73_m2} (ref >59)
GFR calc non Af Amer: 91 mL/min/{1.73_m2} (ref >59)
Glucose: 94 mg/dL (ref 65–99)
Potassium: 4.3 mmol/L (ref 3.5–5.2)
Sodium: 138 mmol/L (ref 134–144)

## 2016-06-24 NOTE — Assessment & Plan Note (Signed)
K 3.4 at last visit and patient has been taking K-Dur 20 mEq consistently. Will check repeat bmet today.  >> K improved to 4.3. Will let patient know she does not require any more potassium supplementation at this time.

## 2016-06-24 NOTE — Assessment & Plan Note (Signed)
Well controlled. Continue Albuterol inhaler PRN.

## 2016-06-24 NOTE — Assessment & Plan Note (Signed)
[name] Bell is on chronic opioid therapy for chronic pain. The date of the controlled substances contract is referenced in the [place] and / or the overview. Date of pain contract was 03/17/16. As part of the treatment plan, the [place] controlled substance database is checked at least twice yearly and the database results are appropriate. I have last reviewed the results on 06/23/16.   The last UDS was on 09/24/15 and results were appropriate for her opioids. UDS did contain EtOH but discussed this with patient and this was a one time occurrence. I do not suspect she is an everyday drinker. Patient needs at least a yearly UDS.   The patient is on hydrocodone/acetaminophen (Lorcet, Lortab, Norco, Vicodin) strength 7.5-325 mg, 81 per 30 days and MS Contin 15 mg 2 tablets Q12H, 120 per 30 days. Adjunctive treatment includes PT and Muscle relaxants. This regimen allows [name] Bell to function and does not cause excessive sedation or other side effects.  "The benefits of continuing opioid therapy outweigh the risks and chronic opioids will be continued. Ongoing education about safe opioid treatment is provided  Interventions today include: UDS and Refills - 3 paper Rx printed  Will follow up UDS results

## 2016-06-24 NOTE — Assessment & Plan Note (Signed)
HR controlled. Lopressor refilled.

## 2016-06-24 NOTE — Assessment & Plan Note (Signed)
She is actually now having more issues with loose stools than constipation. Advised her to switch her Miralax to PRN and to continue Colace and Senokot daily. Discussed that she is at risk for constipation with her opioid therapy and to resume Miralax daily if not having at least 1 BM daily.

## 2016-06-24 NOTE — Assessment & Plan Note (Signed)
Patient reporting abnormal hair loss but this was not appreciated on exam. Reassured patient that it is normal to lose some hair with normal grooming such as brushing her hair. Will continue to monitor.

## 2016-06-26 LAB — TOXASSURE SELECT,+ANTIDEPR,UR

## 2016-06-29 ENCOUNTER — Telehealth: Payer: Self-pay | Admitting: *Deleted

## 2016-06-29 DIAGNOSIS — I10 Essential (primary) hypertension: Secondary | ICD-10-CM | POA: Diagnosis not present

## 2016-06-29 DIAGNOSIS — E785 Hyperlipidemia, unspecified: Secondary | ICD-10-CM | POA: Diagnosis not present

## 2016-06-29 DIAGNOSIS — H3530 Unspecified macular degeneration: Secondary | ICD-10-CM | POA: Diagnosis not present

## 2016-06-29 DIAGNOSIS — J449 Chronic obstructive pulmonary disease, unspecified: Secondary | ICD-10-CM | POA: Diagnosis not present

## 2016-06-29 DIAGNOSIS — M810 Age-related osteoporosis without current pathological fracture: Secondary | ICD-10-CM | POA: Diagnosis not present

## 2016-06-29 DIAGNOSIS — E440 Moderate protein-calorie malnutrition: Secondary | ICD-10-CM | POA: Diagnosis not present

## 2016-06-29 DIAGNOSIS — Z433 Encounter for attention to colostomy: Secondary | ICD-10-CM | POA: Diagnosis not present

## 2016-06-29 DIAGNOSIS — M6281 Muscle weakness (generalized): Secondary | ICD-10-CM | POA: Diagnosis not present

## 2016-06-29 DIAGNOSIS — M549 Dorsalgia, unspecified: Secondary | ICD-10-CM | POA: Diagnosis not present

## 2016-06-29 NOTE — Telephone Encounter (Signed)
-----   Message from Juliet Rude, MD sent at 06/26/2016  9:42 PM EDT ----- Al Corpus,  Could you please let Ms. Dano know that she can stop taking potassium for now as her potassium level was good when we checked it.   Thank you :) Carly

## 2016-06-29 NOTE — Telephone Encounter (Signed)
Called pt - informed "she can stop taking potassium for now as her potassium level was good when we checked it." per Dr Arcelia Jew. Informed it's 4.3. Stated "good".

## 2016-06-29 NOTE — Progress Notes (Signed)
Internal Medicine Clinic Attending  Case discussed with Dr. Rivet soon after the resident saw the patient.  We reviewed the resident's history and exam and pertinent patient test results.  I agree with the assessment, diagnosis, and plan of care documented in the resident's note.  

## 2016-07-01 DIAGNOSIS — M549 Dorsalgia, unspecified: Secondary | ICD-10-CM | POA: Diagnosis not present

## 2016-07-01 DIAGNOSIS — J449 Chronic obstructive pulmonary disease, unspecified: Secondary | ICD-10-CM | POA: Diagnosis not present

## 2016-07-01 DIAGNOSIS — M810 Age-related osteoporosis without current pathological fracture: Secondary | ICD-10-CM | POA: Diagnosis not present

## 2016-07-01 DIAGNOSIS — H3530 Unspecified macular degeneration: Secondary | ICD-10-CM | POA: Diagnosis not present

## 2016-07-01 DIAGNOSIS — Z433 Encounter for attention to colostomy: Secondary | ICD-10-CM | POA: Diagnosis not present

## 2016-07-01 DIAGNOSIS — E785 Hyperlipidemia, unspecified: Secondary | ICD-10-CM | POA: Diagnosis not present

## 2016-07-01 DIAGNOSIS — M6281 Muscle weakness (generalized): Secondary | ICD-10-CM | POA: Diagnosis not present

## 2016-07-01 DIAGNOSIS — E440 Moderate protein-calorie malnutrition: Secondary | ICD-10-CM | POA: Diagnosis not present

## 2016-07-01 DIAGNOSIS — I10 Essential (primary) hypertension: Secondary | ICD-10-CM | POA: Diagnosis not present

## 2016-07-03 DIAGNOSIS — M6281 Muscle weakness (generalized): Secondary | ICD-10-CM | POA: Diagnosis not present

## 2016-07-03 DIAGNOSIS — I10 Essential (primary) hypertension: Secondary | ICD-10-CM | POA: Diagnosis not present

## 2016-07-03 DIAGNOSIS — J449 Chronic obstructive pulmonary disease, unspecified: Secondary | ICD-10-CM | POA: Diagnosis not present

## 2016-07-03 DIAGNOSIS — H3530 Unspecified macular degeneration: Secondary | ICD-10-CM | POA: Diagnosis not present

## 2016-07-03 DIAGNOSIS — E785 Hyperlipidemia, unspecified: Secondary | ICD-10-CM | POA: Diagnosis not present

## 2016-07-03 DIAGNOSIS — E440 Moderate protein-calorie malnutrition: Secondary | ICD-10-CM | POA: Diagnosis not present

## 2016-07-03 DIAGNOSIS — M810 Age-related osteoporosis without current pathological fracture: Secondary | ICD-10-CM | POA: Diagnosis not present

## 2016-07-03 DIAGNOSIS — Z433 Encounter for attention to colostomy: Secondary | ICD-10-CM | POA: Diagnosis not present

## 2016-07-03 DIAGNOSIS — M549 Dorsalgia, unspecified: Secondary | ICD-10-CM | POA: Diagnosis not present

## 2016-07-06 DIAGNOSIS — Z933 Colostomy status: Secondary | ICD-10-CM | POA: Diagnosis not present

## 2016-07-07 ENCOUNTER — Encounter: Payer: Self-pay | Admitting: *Deleted

## 2016-07-07 DIAGNOSIS — J449 Chronic obstructive pulmonary disease, unspecified: Secondary | ICD-10-CM | POA: Diagnosis not present

## 2016-07-07 DIAGNOSIS — I10 Essential (primary) hypertension: Secondary | ICD-10-CM | POA: Diagnosis not present

## 2016-07-07 DIAGNOSIS — H3530 Unspecified macular degeneration: Secondary | ICD-10-CM | POA: Diagnosis not present

## 2016-07-07 DIAGNOSIS — Z433 Encounter for attention to colostomy: Secondary | ICD-10-CM | POA: Diagnosis not present

## 2016-07-07 DIAGNOSIS — E440 Moderate protein-calorie malnutrition: Secondary | ICD-10-CM | POA: Diagnosis not present

## 2016-07-07 DIAGNOSIS — M549 Dorsalgia, unspecified: Secondary | ICD-10-CM | POA: Diagnosis not present

## 2016-07-07 DIAGNOSIS — M810 Age-related osteoporosis without current pathological fracture: Secondary | ICD-10-CM | POA: Diagnosis not present

## 2016-07-07 DIAGNOSIS — M6281 Muscle weakness (generalized): Secondary | ICD-10-CM | POA: Diagnosis not present

## 2016-07-07 DIAGNOSIS — E785 Hyperlipidemia, unspecified: Secondary | ICD-10-CM | POA: Diagnosis not present

## 2016-07-08 DIAGNOSIS — J449 Chronic obstructive pulmonary disease, unspecified: Secondary | ICD-10-CM | POA: Diagnosis not present

## 2016-07-08 DIAGNOSIS — M549 Dorsalgia, unspecified: Secondary | ICD-10-CM | POA: Diagnosis not present

## 2016-07-08 DIAGNOSIS — Z433 Encounter for attention to colostomy: Secondary | ICD-10-CM | POA: Diagnosis not present

## 2016-07-08 DIAGNOSIS — H3530 Unspecified macular degeneration: Secondary | ICD-10-CM | POA: Diagnosis not present

## 2016-07-08 DIAGNOSIS — I10 Essential (primary) hypertension: Secondary | ICD-10-CM | POA: Diagnosis not present

## 2016-07-08 DIAGNOSIS — E440 Moderate protein-calorie malnutrition: Secondary | ICD-10-CM | POA: Diagnosis not present

## 2016-07-08 DIAGNOSIS — M6281 Muscle weakness (generalized): Secondary | ICD-10-CM | POA: Diagnosis not present

## 2016-07-08 DIAGNOSIS — M810 Age-related osteoporosis without current pathological fracture: Secondary | ICD-10-CM | POA: Diagnosis not present

## 2016-07-08 DIAGNOSIS — E785 Hyperlipidemia, unspecified: Secondary | ICD-10-CM | POA: Diagnosis not present

## 2016-07-13 DIAGNOSIS — Z933 Colostomy status: Secondary | ICD-10-CM | POA: Diagnosis not present

## 2016-07-14 DIAGNOSIS — Z933 Colostomy status: Secondary | ICD-10-CM | POA: Diagnosis not present

## 2016-07-15 DIAGNOSIS — Z433 Encounter for attention to colostomy: Secondary | ICD-10-CM | POA: Diagnosis not present

## 2016-07-15 DIAGNOSIS — M810 Age-related osteoporosis without current pathological fracture: Secondary | ICD-10-CM | POA: Diagnosis not present

## 2016-07-15 DIAGNOSIS — M549 Dorsalgia, unspecified: Secondary | ICD-10-CM | POA: Diagnosis not present

## 2016-07-15 DIAGNOSIS — E440 Moderate protein-calorie malnutrition: Secondary | ICD-10-CM | POA: Diagnosis not present

## 2016-07-15 DIAGNOSIS — I10 Essential (primary) hypertension: Secondary | ICD-10-CM | POA: Diagnosis not present

## 2016-07-15 DIAGNOSIS — M6281 Muscle weakness (generalized): Secondary | ICD-10-CM | POA: Diagnosis not present

## 2016-07-15 DIAGNOSIS — J449 Chronic obstructive pulmonary disease, unspecified: Secondary | ICD-10-CM | POA: Diagnosis not present

## 2016-07-15 DIAGNOSIS — H3530 Unspecified macular degeneration: Secondary | ICD-10-CM | POA: Diagnosis not present

## 2016-07-15 DIAGNOSIS — E785 Hyperlipidemia, unspecified: Secondary | ICD-10-CM | POA: Diagnosis not present

## 2016-07-17 DIAGNOSIS — I10 Essential (primary) hypertension: Secondary | ICD-10-CM | POA: Diagnosis not present

## 2016-07-17 DIAGNOSIS — M810 Age-related osteoporosis without current pathological fracture: Secondary | ICD-10-CM | POA: Diagnosis not present

## 2016-07-17 DIAGNOSIS — H3530 Unspecified macular degeneration: Secondary | ICD-10-CM | POA: Diagnosis not present

## 2016-07-17 DIAGNOSIS — Z433 Encounter for attention to colostomy: Secondary | ICD-10-CM | POA: Diagnosis not present

## 2016-07-17 DIAGNOSIS — M6281 Muscle weakness (generalized): Secondary | ICD-10-CM | POA: Diagnosis not present

## 2016-07-17 DIAGNOSIS — E785 Hyperlipidemia, unspecified: Secondary | ICD-10-CM | POA: Diagnosis not present

## 2016-07-17 DIAGNOSIS — E440 Moderate protein-calorie malnutrition: Secondary | ICD-10-CM | POA: Diagnosis not present

## 2016-07-17 DIAGNOSIS — J449 Chronic obstructive pulmonary disease, unspecified: Secondary | ICD-10-CM | POA: Diagnosis not present

## 2016-07-17 DIAGNOSIS — M549 Dorsalgia, unspecified: Secondary | ICD-10-CM | POA: Diagnosis not present

## 2016-07-20 ENCOUNTER — Ambulatory Visit (HOSPITAL_COMMUNITY)
Admission: RE | Admit: 2016-07-20 | Discharge: 2016-07-20 | Disposition: A | Discharge status: Home / Self Care | Payer: Medicare Other | Source: Ambulatory Visit | Attending: Internal Medicine | Admitting: Internal Medicine

## 2016-07-20 ENCOUNTER — Ambulatory Visit (INDEPENDENT_AMBULATORY_CARE_PROVIDER_SITE_OTHER): Payer: Medicare Other | Admitting: Internal Medicine

## 2016-07-20 VITALS — BP 136/43 | HR 103 | Temp 97.8°F | Wt 131.2 lb

## 2016-07-20 DIAGNOSIS — Z79891 Long term (current) use of opiate analgesic: Secondary | ICD-10-CM

## 2016-07-20 DIAGNOSIS — R05 Cough: Secondary | ICD-10-CM

## 2016-07-20 DIAGNOSIS — G8929 Other chronic pain: Secondary | ICD-10-CM | POA: Diagnosis not present

## 2016-07-20 DIAGNOSIS — R058 Other specified cough: Secondary | ICD-10-CM | POA: Insufficient documentation

## 2016-07-20 DIAGNOSIS — J449 Chronic obstructive pulmonary disease, unspecified: Secondary | ICD-10-CM | POA: Diagnosis not present

## 2016-07-20 DIAGNOSIS — Z933 Colostomy status: Secondary | ICD-10-CM

## 2016-07-20 DIAGNOSIS — M549 Dorsalgia, unspecified: Secondary | ICD-10-CM

## 2016-07-20 MED ORDER — AMOXICILLIN-POT CLAVULANATE 875-125 MG PO TABS: 1.0000 | ORAL_TABLET | Freq: Two times a day (BID) | ORAL | 0 refills | Status: AC

## 2016-07-20 MED ORDER — HYDROCODONE-ACETAMINOPHEN 7.5-325 MG PO TABS: 1.0000 | ORAL_TABLET | Freq: Four times a day (QID) | ORAL | 0 refills | Status: DC | PRN

## 2016-07-20 MED ORDER — DM-GUAIFENESIN ER 30-600 MG PO TB12: 1.0000 | ORAL_TABLET | Freq: Two times a day (BID) | ORAL | 0 refills | Status: DC

## 2016-07-20 NOTE — Progress Notes (Signed)
   CC: Productive cough, nasal congestion and worsening of her chronic back pain for 2 weeks.  HPI:  [name] Bell is a 71 y.o. with past medical history as listed below, came to the clinic with complaint of productive cough of thick mucus for 2 weeks, patient unable to describe the color or hemoptysis because she is blind. She was also complaining of left maxillary sinus pain. She denies any fever or chills. She denies any chest pain but to endorse some shortness of breath. She was also complaining of mild nausea and decreased appetite without any vomitus for the same duration. She denies any abdominal pain, diarrhea or constipation. No urinary symptoms. According to patient cough is making her chronic back pain worse, she was using extra Norco and ran out of her prescription a week earlier. She lives at assisted living facility and there were other residents with the same complaint.  Past Medical History:  Diagnosis Date  . Acute sinusitis 06/30/2011  . Anxiety   . Basal cell carcinoma    "left cheek"  . Bleeding ulcer   . Blind in both eyes   . Chronic back pain   . Degenerative disk disease    This is interscapular, mild compression frx of T12 superior endplate with Schmorl's node. Seen on CT in 2/08  . Depression   . Duodenal ulcer 11/08   With hemorrhage and obstruction  . History of blood transfusion    "related to bleeding from intestines and vomiting blood"  . Ischemic colitis ([place]) 07/30/2011   2009 by endoscopy   . Macular degeneration, wet ([place])   . Osteomyelitis, jaw acute 11/08   Started while in the hospital abcess showed GNR . SP debridement by Dr. Lilli Few,  Priscella Mann S Bovis sp 4  weeks of Pen V started on 05/19/07  with additional 2 weeks in 07/04/07  . Perforated bowel [facility])    surgery july 2013  . Superior mesenteric artery syndrome ([place]) 11/08   sp dilation by Dr. Watt Climes, Pt may require bowel resetion if sx recur    Review of Systems:  As per HPI.  Physical  Exam:  Vitals:   07/20/16 1508  BP: (!) 136/43  Pulse: (!) 103  Temp: 97.8 F (36.6 C)  TempSrc: Oral  SpO2: 98%  Weight: 131 lb 3.2 oz (59.5 kg)    General: Vital signs reviewed.  Patient is well-developed and well-nourished, in no acute distress and cooperative with exam.  HEENT: Normocephalic and atraumatic. Mild left fascial tenderness, no pharyngeal erythema or exudate, no nasal exudate noted. Cardiovascular: RRR, S1 normal, S2 normal, no murmurs, gallops, or rubs. Pulmonary/Chest: Appears junky with coarse breath sounds. No rhonchi or crackles. Abdominal: Left-sided colostomy bag intact with brown feces ,Soft, non-tender, non-distended, BS +. Extremities: No lower extremity edema bilaterally,  pulses symmetric and intact bilaterally. No cyanosis or clubbing. Skin: Warm, dry and intact. No rashes or erythema. Psychiatric: Normal mood and affect. speech and behavior is normal. Cognition and memory are normal.  Assessment & Plan:   See Encounters Tab for problem based charting.  Patient discussed with Dr. Daryll Drown.

## 2016-07-20 NOTE — Patient Instructions (Signed)
Thank you for visiting clinic today. I'm giving you a prescription for Norco 20 extra tablets, if your pharmacy refused to fill it, please let them call us. You can fill your morphine prescription tomorrow, as we discuss you should not be taking your morphine more than which was prescribed as it can be dangerous and can result in her death. We are checking your blood and doing a chest x-ray to make sure that you are not having any bad chest infection. I'm giving you a prescription for antibiotics called Augmentin for 7 days. You can also take Mucinex to help bringing that flamed up and reduce coughing. Please follow up in clinic in 1-2 weeks if your symptoms does not improve. Please seek immediate medical attention if your symptoms get worse.

## 2016-07-20 NOTE — Assessment & Plan Note (Signed)
She ran out of her Norco one week earlier because of worsening back pain due to excessive coughing.  [place] Community Database was appropriate, her last refill for Norco was done on 06/26/2016 and refill of morphine was done on 06/19/2016.  Urine toxicology done during previous office visit was appropriate.   -She was given a prescription of Norco-20 tablets, for acute on chronic pain. -She was also advised to not use extra morphine as that can be dangerous and can result in respiratory arrest resulting in death. Patient seems understanding and stating that she do not use extra morphine, but she was using extra Norco for this pain. -She can get her morphine prescription refill tomorrow.

## 2016-07-20 NOTE — Assessment & Plan Note (Signed)
came to the clinic with complaint of productive cough of thick mucus for 2 weeks, patient unable to describe the color or hemoptysis because she is blind. She was also complaining of left maxillary sinus pain. She denies any fever or chills. She denies any chest pain but to endorse some shortness of breath. She was also complaining of mild nausea and decreased appetite without any vomitus for the same duration. She denies any abdominal pain, diarrhea or constipation. No urinary symptoms. According to patient cough is making her chronic back pain worse, she was using extra Norco and ran out of her prescription a week earlier. She lives at assisted living facility and there were other residents with the same complaint.  Most likely upper respiratory infection, unlikely viral because of unilateral sinus congestion and pain and symptoms lasting for 2 weeks. Patient has an history of COPD.  No wheezing but her chest appears junky  with coarse breath sounds.  -CXR- shows hyperinflated lungs without any consolidation. -CBC -Augmentin for 1 week. -Mucinex DM

## 2016-07-21 ENCOUNTER — Other Ambulatory Visit: Payer: Self-pay

## 2016-07-21 LAB — CBC WITH DIFFERENTIAL/PLATELET
Basophils Absolute: 0 10*3/uL (ref 0.0–0.2)
Basos: 0 %
EOS (ABSOLUTE): 0 10*3/uL (ref 0.0–0.4)
Eos: 0 %
Hematocrit: 46.4 % (ref 34.0–46.6)
Hemoglobin: 14.9 g/dL (ref 11.1–15.9)
IMMATURE GRANS (ABS): 0 10*3/uL (ref 0.0–0.1)
IMMATURE GRANULOCYTES: 0 %
LYMPHS: 31 %
Lymphocytes Absolute: 2.8 10*3/uL (ref 0.7–3.1)
MCH: 29.7 pg (ref 26.6–33.0)
MCHC: 32.1 g/dL (ref 31.5–35.7)
MCV: 93 fL (ref 79–97)
Monocytes Absolute: 0.5 10*3/uL (ref 0.1–0.9)
Monocytes: 5 %
Neutrophils Absolute: 5.9 10*3/uL (ref 1.4–7.0)
Neutrophils: 64 %
Platelets: 326 10*3/uL (ref 150–379)
RBC: 5.01 x10E6/uL (ref 3.77–5.28)
RDW: 13.3 % (ref 12.3–15.4)
WBC: 9.3 10*3/uL (ref 3.4–10.8)

## 2016-07-21 MED ORDER — PROMETHAZINE HCL 12.5 MG PO TABS: 12.5000 mg | ORAL_TABLET | Freq: Four times a day (QID) | ORAL | 2 refills | Status: DC | PRN

## 2016-07-21 MED ORDER — ALBUTEROL SULFATE HFA 108 (90 BASE) MCG/ACT IN AERS: 2.0000 | INHALATION_SPRAY | Freq: Four times a day (QID) | RESPIRATORY_TRACT | 3 refills | Status: DC | PRN

## 2016-07-21 NOTE — Progress Notes (Signed)
Internal Medicine Clinic Attending  Case discussed with Dr. Amin at the time of the visit.  We reviewed the resident's history and exam and pertinent patient test results.  I agree with the assessment, diagnosis, and plan of care documented in the resident's note.    

## 2016-08-03 DIAGNOSIS — M810 Age-related osteoporosis without current pathological fracture: Secondary | ICD-10-CM | POA: Diagnosis not present

## 2016-08-03 DIAGNOSIS — Z433 Encounter for attention to colostomy: Secondary | ICD-10-CM | POA: Diagnosis not present

## 2016-08-03 DIAGNOSIS — E785 Hyperlipidemia, unspecified: Secondary | ICD-10-CM | POA: Diagnosis not present

## 2016-08-03 DIAGNOSIS — H3530 Unspecified macular degeneration: Secondary | ICD-10-CM | POA: Diagnosis not present

## 2016-08-03 DIAGNOSIS — J449 Chronic obstructive pulmonary disease, unspecified: Secondary | ICD-10-CM | POA: Diagnosis not present

## 2016-08-03 DIAGNOSIS — M6281 Muscle weakness (generalized): Secondary | ICD-10-CM | POA: Diagnosis not present

## 2016-08-03 DIAGNOSIS — E440 Moderate protein-calorie malnutrition: Secondary | ICD-10-CM | POA: Diagnosis not present

## 2016-08-03 DIAGNOSIS — M549 Dorsalgia, unspecified: Secondary | ICD-10-CM | POA: Diagnosis not present

## 2016-08-03 DIAGNOSIS — I10 Essential (primary) hypertension: Secondary | ICD-10-CM | POA: Diagnosis not present

## 2016-08-07 ENCOUNTER — Telehealth: Payer: Self-pay

## 2016-08-07 NOTE — Telephone Encounter (Signed)
[name] Bell with [facility] requesting VO. Please call back.

## 2016-08-12 DIAGNOSIS — M6281 Muscle weakness (generalized): Secondary | ICD-10-CM | POA: Diagnosis not present

## 2016-08-12 DIAGNOSIS — E785 Hyperlipidemia, unspecified: Secondary | ICD-10-CM | POA: Diagnosis not present

## 2016-08-12 DIAGNOSIS — M549 Dorsalgia, unspecified: Secondary | ICD-10-CM | POA: Diagnosis not present

## 2016-08-12 DIAGNOSIS — I10 Essential (primary) hypertension: Secondary | ICD-10-CM | POA: Diagnosis not present

## 2016-08-12 DIAGNOSIS — M810 Age-related osteoporosis without current pathological fracture: Secondary | ICD-10-CM | POA: Diagnosis not present

## 2016-08-12 DIAGNOSIS — H3530 Unspecified macular degeneration: Secondary | ICD-10-CM | POA: Diagnosis not present

## 2016-08-12 DIAGNOSIS — E440 Moderate protein-calorie malnutrition: Secondary | ICD-10-CM | POA: Diagnosis not present

## 2016-08-12 DIAGNOSIS — Z433 Encounter for attention to colostomy: Secondary | ICD-10-CM | POA: Diagnosis not present

## 2016-08-12 DIAGNOSIS — J449 Chronic obstructive pulmonary disease, unspecified: Secondary | ICD-10-CM | POA: Diagnosis not present

## 2016-08-14 ENCOUNTER — Ambulatory Visit (HOSPITAL_COMMUNITY)
Admission: RE | Admit: 2016-08-14 | Discharge: 2016-08-14 | Disposition: A | Discharge status: Home / Self Care | Payer: Medicare Other | Source: Ambulatory Visit | Attending: Internal Medicine | Admitting: Internal Medicine

## 2016-08-14 ENCOUNTER — Ambulatory Visit (INDEPENDENT_AMBULATORY_CARE_PROVIDER_SITE_OTHER): Payer: Medicare Other | Admitting: Internal Medicine

## 2016-08-14 VITALS — Wt 132.1 lb

## 2016-08-14 DIAGNOSIS — E440 Moderate protein-calorie malnutrition: Secondary | ICD-10-CM | POA: Diagnosis not present

## 2016-08-14 DIAGNOSIS — E785 Hyperlipidemia, unspecified: Secondary | ICD-10-CM | POA: Diagnosis not present

## 2016-08-14 DIAGNOSIS — Z79899 Other long term (current) drug therapy: Secondary | ICD-10-CM

## 2016-08-14 DIAGNOSIS — M549 Dorsalgia, unspecified: Secondary | ICD-10-CM

## 2016-08-14 DIAGNOSIS — M4854XA Collapsed vertebra, not elsewhere classified, thoracic region, initial encounter for fracture: Secondary | ICD-10-CM | POA: Diagnosis not present

## 2016-08-14 DIAGNOSIS — Z79891 Long term (current) use of opiate analgesic: Secondary | ICD-10-CM

## 2016-08-14 DIAGNOSIS — M542 Cervicalgia: Secondary | ICD-10-CM | POA: Diagnosis not present

## 2016-08-14 DIAGNOSIS — M419 Scoliosis, unspecified: Secondary | ICD-10-CM | POA: Insufficient documentation

## 2016-08-14 DIAGNOSIS — M8580 Other specified disorders of bone density and structure, unspecified site: Secondary | ICD-10-CM | POA: Diagnosis not present

## 2016-08-14 DIAGNOSIS — H3530 Unspecified macular degeneration: Secondary | ICD-10-CM | POA: Diagnosis not present

## 2016-08-14 DIAGNOSIS — I10 Essential (primary) hypertension: Secondary | ICD-10-CM | POA: Diagnosis not present

## 2016-08-14 DIAGNOSIS — G8929 Other chronic pain: Secondary | ICD-10-CM | POA: Insufficient documentation

## 2016-08-14 DIAGNOSIS — M546 Pain in thoracic spine: Secondary | ICD-10-CM | POA: Diagnosis not present

## 2016-08-14 DIAGNOSIS — J449 Chronic obstructive pulmonary disease, unspecified: Secondary | ICD-10-CM | POA: Diagnosis not present

## 2016-08-14 DIAGNOSIS — Z433 Encounter for attention to colostomy: Secondary | ICD-10-CM | POA: Diagnosis not present

## 2016-08-14 DIAGNOSIS — Z87891 Personal history of nicotine dependence: Secondary | ICD-10-CM

## 2016-08-14 DIAGNOSIS — M6281 Muscle weakness (generalized): Secondary | ICD-10-CM | POA: Diagnosis not present

## 2016-08-14 DIAGNOSIS — M810 Age-related osteoporosis without current pathological fracture: Secondary | ICD-10-CM | POA: Diagnosis not present

## 2016-08-14 DIAGNOSIS — M545 Low back pain: Secondary | ICD-10-CM | POA: Diagnosis not present

## 2016-08-14 MED ORDER — BUPROPION HCL ER (SR) 150 MG PO TB12
150.0000 mg | ORAL_TABLET | Freq: Every day | ORAL | 1 refills | Status: DC
Start: 2016-08-14 — End: 2017-06-08

## 2016-08-14 NOTE — Assessment & Plan Note (Signed)
The patient has had worsening acute on chronic back pain for the last two months which she states has been unrelenting. The patient's interventions, including increased narcotic pain medication, Goody's powder, heating pads, and lidocaine patches have not helped her. She is requesting more narcotic pain medication today. Since she was given extra pain medication on 07/21/2015, I did not prescribe additional narcotics at this time. I am concerned that giving her more short acting pain medication may alter her mental status and make her more susceptible to falls - given that she fell 4 days ago, I think this is a reasonable worry. The patient was previously on Wellbutrin and this was restarted today with the hope that it may, in addition to the nortriptyline, help control her mood and pain. The patient has extra Wellbutrin at home and was instructed to take 1 tablet for 1 week and will be reevaluated at the next clinic visit. The patient was told to stop using lidocaine patches in combination with heating pads. It appears that she has burned her back with this regimen. A Vaseline dressing was applied in clinic today to help with healing of this wound.  The etiology of this patient's worsening back pain is unclear at this time. A chest X ray performed at last visit on 07/21/2015 did not show any evidence of infection or rib fractures (she was initially complaining of cough). Acute onset of pain between the shoulder blades is concerning for aortic dissection, however the patient's time course, hemodynamic stability, and normal aortic architecture on 07/21/2015 X ray are not consistent with her diagnosis. The worsening of chronic pain,  point tenderness at multiple levels of the thoracic spine, history of osteoporosis, and recent fall are concerning for a new compression fracture. Imaging of her cervical, thoracic, and lumbar spine were ordered today  to determine if any new acute fractures have developed. If new  fractures are present, the patient can be offered kyphoplasty for pain management.

## 2016-08-14 NOTE — Progress Notes (Signed)
CC: Acute on chronic back pain  HPI:  [name] is a 71 y.o. with anxiety and chronic back pain 2/2 degenerative disc disease who presents today with a 2 month history of worsening pain in her upper-mid back. She states that she has had chronic pain in her back for many years but her recent pain is different from what she normally feels. She states that this new pain is so severe that she can no longer walk around or get through her activities of daily living. She rates the pain as a 10/10. It is localized to her mid upper back and radiates down into her lower back. She denies numbness and tingling in upper or lower extremities. She states that her pain is worse when she coughs and she states it feels like something in her back is broken. The pain has been continuous and she has not gotten relief with any of her usual pain medicines.   The patient states that she has had to take more than her usual pain medication and that she is almost out of her medicines. She states that the extra Norco she received at the last clinic visit on 07/20/2016 is almost completely gone (as well as her 81 tablets for July). She states that this extra pain medicine has not helped her take away her worsening back pain. She has also tried lidocaine patches, a heating pad, and is taking Goody's powder (q 3-4 hours) to help with the pain. None of this has brought her relief. She thinks she burned herself with the heating pad because she has been using this so much over the past few days.   She states that she has been participating in PT to help with her leg strength, but they have yet to address her back problems during her sessions. The patient has a history of falls, which is why she said she started PT in the first place. The patient describes a recent fall 4 days ago where she tripped over an object on the ground and hit her shoulder/upper back against the wall. She did not hit her head. No LOC. She doesn't think her  back pain got any worse after the fall, but is concerned that it hasn't gotten better in 2 months.   Past Medical History:  Diagnosis Date  . Acute sinusitis 06/30/2011  . Anxiety   . Basal cell carcinoma    "left cheek"  . Bleeding ulcer   . Blind in both eyes   . Chronic back pain   . Degenerative disk disease    This is interscapular, mild compression frx of T12 superior endplate with Schmorl's node. Seen on CT in 2/08  . Depression   . Duodenal ulcer 11/08   With hemorrhage and obstruction  . History of blood transfusion    "related to bleeding from intestines and vomiting blood"  . Ischemic colitis ([place]) 07/30/2011   2009 by endoscopy   . Macular degeneration, wet ([place])   . Osteomyelitis, jaw acute 11/08   Started while in the hospital abcess showed GNR . SP debridement by Dr. Lilli Few,  Priscella Mann S Bovis sp 4  weeks of Pen V started on 05/19/07  with additional 2 weeks in 07/04/07  . Perforated bowel [facility])    surgery july 2013  . Superior mesenteric artery syndrome ([place]) 11/08   sp dilation by Dr. Watt Climes, Pt may require bowel resetion if sx recur   Review of Systems:   Patient denies chest pain,  SOB, headaches, changes in sensation to upper and lower extremities, abdominal pain, and changes in bowel/bladder habits.  Physical Exam:  Vitals:   08/14/16 1418  Weight: 132 lb 1.6 oz (59.9 kg)   Physical Exam  Constitutional:  Thin appearing woman sitting uncomfortably in her chair in no acute distress.  Cardiovascular: Normal rate, regular rhythm, normal heart sounds and intact distal pulses.  Exam reveals no friction rub.   No murmur heard. Pulmonary/Chest: Effort normal and breath sounds normal. No respiratory distress. She has no wheezes.  Abdominal: Soft. She exhibits no distension. There is no tenderness. There is no guarding.  LLQ colostomy in place without surrounding erythema. No blood in colostomy bag.  Musculoskeletal:  The patient has a 8x12 cm section of  hyperpigmentation on the patient's back that starts in her mid back (around T7) and extends downward towards lumbar spine (ending around L4). There are areas of peeling skin and erythema within the hyperpigmented areas. There is tenderness to palpation of the spinous process throughout the thoracic spine, starting about the level of T3 and extending to T7.   Skin: Skin is warm and dry. Capillary refill takes less than 2 seconds. No rash noted.   Assessment & Plan:   See Encounters Tab for problem based charting.  Patient seen with Dr. Daryll Drown.

## 2016-08-14 NOTE — Assessment & Plan Note (Signed)
The patient was requesting more narcotic pain medication today. She was given 20 extra pills on 07/20/2016 and was not given any additional medication today. It appears that she was tapered of Norco about 1 year ago. It is unclear if some of this worsening pain is a result of this taper or not. She may be having some symptoms of withdrawal. It is concerning that the patient requested even more narcotic pain medication on multiple occasions during today's visit. She stated that she went through all but 10 of the 81 pills she got at the beginning of the month and all of the 20 additional pills she got at the end of last month. It may be worthwhile talking to the patient about her pain contract again.

## 2016-08-14 NOTE — Patient Instructions (Addendum)
Thank you for seeing [place] today!  You were scheduled for multiple X rays today. Please go to these appointments so we can assess what is wrong with your back.  Continue to take your pain medication as prescribed. Please do not use heating pads or lidocaine patches on your back until your skin has healed.   Please return to the clinic in 1 week to discuss the results of your test and to reassess your pain.

## 2016-08-17 ENCOUNTER — Other Ambulatory Visit: Payer: Self-pay | Admitting: *Deleted

## 2016-08-17 ENCOUNTER — Telehealth: Payer: Self-pay | Admitting: Internal Medicine

## 2016-08-17 DIAGNOSIS — J329 Chronic sinusitis, unspecified: Secondary | ICD-10-CM

## 2016-08-17 NOTE — Progress Notes (Signed)
Internal Medicine Clinic Attending  I saw and evaluated the patient.  I personally confirmed the key portions of the history and exam documented by Dr. Nedrud and I reviewed pertinent patient test results.  The assessment, diagnosis, and plan were formulated together and I agree with the documentation in the resident's note.  

## 2016-08-17 NOTE — Telephone Encounter (Signed)
Patient scheduled an appointment on 08/19/16 at 2:45 pm in [facility] when she left on 08/14/2016.

## 2016-08-17 NOTE — Telephone Encounter (Signed)
Patient was called with results from C/T/L spine Xray which showed new compression fracture at T6 that occurred after previous imaging on 07/20/2016. All known compression fractures (T5, T12) stable. The patient expressed understanding of these results.  The patient was counseled that this new fracture likely resulted from the recent fall that she described during her clinic visit on 08/14/2016. The patient was distraught on the phone about the new fracture and wondering how she will manage her pain moving forward. I explained that I wouldn't be able to increase narcotic pain medication at this time, given that her risk of falling and developing a new fracture would increase with more narcotic medication. The patient was counseled that she could slowly restart her Wellbutrin, as previously discussed during her clinic visit, as adjuvant therapy for her pain. She was told to get in touch with the clinic and her PCP so that she could discuss management of new fracture and pain control regimen.

## 2016-08-18 DIAGNOSIS — E785 Hyperlipidemia, unspecified: Secondary | ICD-10-CM | POA: Diagnosis not present

## 2016-08-18 DIAGNOSIS — H3530 Unspecified macular degeneration: Secondary | ICD-10-CM | POA: Diagnosis not present

## 2016-08-18 DIAGNOSIS — M810 Age-related osteoporosis without current pathological fracture: Secondary | ICD-10-CM | POA: Diagnosis not present

## 2016-08-18 DIAGNOSIS — I10 Essential (primary) hypertension: Secondary | ICD-10-CM | POA: Diagnosis not present

## 2016-08-18 DIAGNOSIS — J449 Chronic obstructive pulmonary disease, unspecified: Secondary | ICD-10-CM | POA: Diagnosis not present

## 2016-08-18 DIAGNOSIS — M6281 Muscle weakness (generalized): Secondary | ICD-10-CM | POA: Diagnosis not present

## 2016-08-18 DIAGNOSIS — M549 Dorsalgia, unspecified: Secondary | ICD-10-CM | POA: Diagnosis not present

## 2016-08-18 DIAGNOSIS — E440 Moderate protein-calorie malnutrition: Secondary | ICD-10-CM | POA: Diagnosis not present

## 2016-08-18 DIAGNOSIS — Z433 Encounter for attention to colostomy: Secondary | ICD-10-CM | POA: Diagnosis not present

## 2016-08-19 ENCOUNTER — Ambulatory Visit (INDEPENDENT_AMBULATORY_CARE_PROVIDER_SITE_OTHER): Payer: Medicare Other | Admitting: Internal Medicine

## 2016-08-19 VITALS — BP 114/43 | HR 94 | Temp 97.9°F | Ht 64.0 in | Wt 128.8 lb

## 2016-08-19 DIAGNOSIS — Z79899 Other long term (current) drug therapy: Secondary | ICD-10-CM

## 2016-08-19 DIAGNOSIS — Z79891 Long term (current) use of opiate analgesic: Secondary | ICD-10-CM | POA: Diagnosis not present

## 2016-08-19 DIAGNOSIS — M546 Pain in thoracic spine: Secondary | ICD-10-CM

## 2016-08-19 DIAGNOSIS — W19XXXA Unspecified fall, initial encounter: Secondary | ICD-10-CM | POA: Diagnosis not present

## 2016-08-19 DIAGNOSIS — S22059A Unspecified fracture of T5-T6 vertebra, initial encounter for closed fracture: Secondary | ICD-10-CM

## 2016-08-19 DIAGNOSIS — Z87891 Personal history of nicotine dependence: Secondary | ICD-10-CM | POA: Diagnosis not present

## 2016-08-19 MED ORDER — MORPHINE SULFATE ER 15 MG PO TBCR: 30.0000 mg | EXTENDED_RELEASE_TABLET | Freq: Two times a day (BID) | ORAL | 0 refills | Status: AC

## 2016-08-19 MED ORDER — HYDROCODONE-ACETAMINOPHEN 7.5-325 MG PO TABS: 1.0000 | ORAL_TABLET | Freq: Four times a day (QID) | ORAL | 0 refills | Status: DC | PRN

## 2016-08-19 NOTE — Patient Instructions (Addendum)
Thank you for coming in today [name] Bell  We are giving you a short prescription for your pain medicines to get you to the 1st when you can refill your normal medications. Your pain should start to improve with time so you can get back to your normal pain medicine schedule and not run out early.   In about 3 weeks we think it would be a good time to start working with PT again to build up your strength.

## 2016-08-19 NOTE — Progress Notes (Signed)
   CC: Back pain   HPI:  [name] is a 71 y.o. F with past medical history as described below who presents to the clinic for follow-up of back pain.   This Struve was seen on 7/20 for acute on chronic back pain which was limiting her function and required extra pain medication. She reported a fall 4 days prior due to tripping over an object, no loss of consciousness, and no near syncope preceding the fall. She also recently hit her back on a pot implant in her house when sitting down which may have contributed to her pain. Subsequent imaging revealed a new T6 compression fracture, stable T5 and T12 compression fractures. She is on chronic opioid therapy with Norco 7-325 mg and MS Contin 15 mg twice a day.  Today, she reports her pain has not improved. Her pain is constant and has limited her mobility, she reports she lays down most of her time at home. She previously worked with physical therapy but has not done exercises with them for about 1 month. She denies numbness or tingling in her extremities, weakness. She has continued to use her pain medications without relief, reports that she took her last morphine today and can obtain refills of her narcotic pain medications on August 1.     Past Medical History:  Diagnosis Date  . Acute sinusitis 06/30/2011  . Anxiety   . Basal cell carcinoma    "left cheek"  . Bleeding ulcer   . Blind in both eyes   . Chronic back pain   . Degenerative disk disease    This is interscapular, mild compression frx of T12 superior endplate with Schmorl's node. Seen on CT in 2/08  . Depression   . Duodenal ulcer 11/08   With hemorrhage and obstruction  . History of blood transfusion    "related to bleeding from intestines and vomiting blood"  . Ischemic colitis ([place]) 07/30/2011   2009 by endoscopy   . Macular degeneration, wet ([place])   . Osteomyelitis, jaw acute 11/08   Started while in the hospital abcess showed GNR . SP debridement by Dr. Lilli Few,   Priscella Mann S Bovis sp 4  weeks of Pen V started on 05/19/07  with additional 2 weeks in 07/04/07  . Perforated bowel [facility])    surgery july 2013  . Superior mesenteric artery syndrome ([place]) 11/08   sp dilation by Dr. Watt Climes, Pt may require bowel resetion if sx recur   Review of Systems:  Review of Systems  Eyes: Negative for blurred vision.  Musculoskeletal: Positive for back pain and falls.     Physical Exam:  Vitals:   08/19/16 1500  BP: (!) 114/43  Pulse: 94  Temp: 97.9 F (36.6 C)  TempSrc: Oral  SpO2: 99%  Weight: 128 lb 12.8 oz (58.4 kg)  Height: 5\' 4"  (1.626 m)   Physical Exam  Constitutional:  Elderly female with kyphotic posture  Abdominal:  Ostomy in place with underlying hernia  Musculoskeletal:  Midline and paraspinal tenderness of thoracic spine  Skin: Skin is warm and dry.  Hyperpigmentation to skin of her back, removed previous dressing from lower back, no underlying abnormalities    Assessment & Plan:   See Encounters Tab for problem based charting.  Patient seen with Dr. Evette Doffing

## 2016-08-19 NOTE — Assessment & Plan Note (Signed)
See acute back pain assessment and plan for details of the short-term management of her acute on chronic pain. Will reassess her chronic pain medication regimen and plan to wean doses at follow-up.

## 2016-08-19 NOTE — Assessment & Plan Note (Signed)
[name] Bell is having continued pain related to her chronic back pain as well as acute compression fracture. She reports a recent mechanical fall, difficulty standing after bending down, and hitting her back on a potted plant while sitting. These are multiple events that could have caused the fracture or contributed to her pain. She has multiple risk factors for falls. Her pain has required chronic opioid use for control, her acute symptoms are leading her to take more of her medications that she has run out about 1 week early. Today we spent a significant amount of time talking about getting her through her acute pain with a long-term goal of reducing her doses and her need of opiate pain medicine. She understands and seems willing to wean her opioids in the future. She would also like to continue working with physical therapy which would be another good long-term option for improved pain control. Advised her to restart physical therapy exercises in 3 weeks  -Provided short-term prescriptions of her chronic pain medicines to last until she refills normal rx- Norco 7-325 mg q6-8 hr, #20 and ms contin 30 mg q12h prn, #28

## 2016-08-20 DIAGNOSIS — E785 Hyperlipidemia, unspecified: Secondary | ICD-10-CM | POA: Diagnosis not present

## 2016-08-20 DIAGNOSIS — H3530 Unspecified macular degeneration: Secondary | ICD-10-CM | POA: Diagnosis not present

## 2016-08-20 DIAGNOSIS — I10 Essential (primary) hypertension: Secondary | ICD-10-CM | POA: Diagnosis not present

## 2016-08-20 DIAGNOSIS — M549 Dorsalgia, unspecified: Secondary | ICD-10-CM | POA: Diagnosis not present

## 2016-08-20 DIAGNOSIS — E440 Moderate protein-calorie malnutrition: Secondary | ICD-10-CM | POA: Diagnosis not present

## 2016-08-20 DIAGNOSIS — Z433 Encounter for attention to colostomy: Secondary | ICD-10-CM | POA: Diagnosis not present

## 2016-08-20 DIAGNOSIS — J449 Chronic obstructive pulmonary disease, unspecified: Secondary | ICD-10-CM | POA: Diagnosis not present

## 2016-08-20 DIAGNOSIS — M6281 Muscle weakness (generalized): Secondary | ICD-10-CM | POA: Diagnosis not present

## 2016-08-20 DIAGNOSIS — M810 Age-related osteoporosis without current pathological fracture: Secondary | ICD-10-CM | POA: Diagnosis not present

## 2016-08-20 NOTE — Progress Notes (Signed)
Internal Medicine Clinic Attending  I saw and evaluated the patient.  I personally confirmed the key portions of the history and exam documented by Dr. Harden and I reviewed pertinent patient test results.  The assessment, diagnosis, and plan were formulated together and I agree with the documentation in the resident's note.  

## 2016-08-24 ENCOUNTER — Telehealth: Payer: Self-pay

## 2016-08-24 DIAGNOSIS — J449 Chronic obstructive pulmonary disease, unspecified: Secondary | ICD-10-CM | POA: Diagnosis not present

## 2016-08-24 DIAGNOSIS — Z433 Encounter for attention to colostomy: Secondary | ICD-10-CM | POA: Diagnosis not present

## 2016-08-24 DIAGNOSIS — M6281 Muscle weakness (generalized): Secondary | ICD-10-CM | POA: Diagnosis not present

## 2016-08-24 DIAGNOSIS — E785 Hyperlipidemia, unspecified: Secondary | ICD-10-CM | POA: Diagnosis not present

## 2016-08-24 DIAGNOSIS — M810 Age-related osteoporosis without current pathological fracture: Secondary | ICD-10-CM | POA: Diagnosis not present

## 2016-08-24 DIAGNOSIS — I10 Essential (primary) hypertension: Secondary | ICD-10-CM | POA: Diagnosis not present

## 2016-08-24 DIAGNOSIS — E440 Moderate protein-calorie malnutrition: Secondary | ICD-10-CM | POA: Diagnosis not present

## 2016-08-24 DIAGNOSIS — H3530 Unspecified macular degeneration: Secondary | ICD-10-CM | POA: Diagnosis not present

## 2016-08-24 DIAGNOSIS — M545 Low back pain: Secondary | ICD-10-CM | POA: Diagnosis not present

## 2016-08-24 NOTE — Telephone Encounter (Signed)
Princess with [facility] is in the pt home, requesting the nurse to call back.

## 2016-08-24 NOTE — Telephone Encounter (Signed)
Home health nurse calling to clarify holding PT services for patient. Informed her of MD note from [place] 7/25 ( restart PT in  3 wks) which should resume 09/09/16. She will let her supervisor know.

## 2016-09-15 DIAGNOSIS — E440 Moderate protein-calorie malnutrition: Secondary | ICD-10-CM | POA: Diagnosis not present

## 2016-09-15 DIAGNOSIS — M810 Age-related osteoporosis without current pathological fracture: Secondary | ICD-10-CM | POA: Diagnosis not present

## 2016-09-15 DIAGNOSIS — J449 Chronic obstructive pulmonary disease, unspecified: Secondary | ICD-10-CM | POA: Diagnosis not present

## 2016-09-15 DIAGNOSIS — M6281 Muscle weakness (generalized): Secondary | ICD-10-CM | POA: Diagnosis not present

## 2016-09-15 DIAGNOSIS — H3530 Unspecified macular degeneration: Secondary | ICD-10-CM | POA: Diagnosis not present

## 2016-09-15 DIAGNOSIS — E785 Hyperlipidemia, unspecified: Secondary | ICD-10-CM | POA: Diagnosis not present

## 2016-09-15 DIAGNOSIS — M545 Low back pain: Secondary | ICD-10-CM | POA: Diagnosis not present

## 2016-09-15 DIAGNOSIS — I10 Essential (primary) hypertension: Secondary | ICD-10-CM | POA: Diagnosis not present

## 2016-09-15 DIAGNOSIS — Z433 Encounter for attention to colostomy: Secondary | ICD-10-CM | POA: Diagnosis not present

## 2016-09-18 DIAGNOSIS — E785 Hyperlipidemia, unspecified: Secondary | ICD-10-CM | POA: Diagnosis not present

## 2016-09-18 DIAGNOSIS — M545 Low back pain: Secondary | ICD-10-CM | POA: Diagnosis not present

## 2016-09-18 DIAGNOSIS — J449 Chronic obstructive pulmonary disease, unspecified: Secondary | ICD-10-CM | POA: Diagnosis not present

## 2016-09-18 DIAGNOSIS — E440 Moderate protein-calorie malnutrition: Secondary | ICD-10-CM | POA: Diagnosis not present

## 2016-09-18 DIAGNOSIS — H3530 Unspecified macular degeneration: Secondary | ICD-10-CM | POA: Diagnosis not present

## 2016-09-18 DIAGNOSIS — Z433 Encounter for attention to colostomy: Secondary | ICD-10-CM | POA: Diagnosis not present

## 2016-09-18 DIAGNOSIS — M6281 Muscle weakness (generalized): Secondary | ICD-10-CM | POA: Diagnosis not present

## 2016-09-18 DIAGNOSIS — M810 Age-related osteoporosis without current pathological fracture: Secondary | ICD-10-CM | POA: Diagnosis not present

## 2016-09-18 DIAGNOSIS — I10 Essential (primary) hypertension: Secondary | ICD-10-CM | POA: Diagnosis not present

## 2016-09-22 DIAGNOSIS — I10 Essential (primary) hypertension: Secondary | ICD-10-CM | POA: Diagnosis not present

## 2016-09-22 DIAGNOSIS — M810 Age-related osteoporosis without current pathological fracture: Secondary | ICD-10-CM | POA: Diagnosis not present

## 2016-09-22 DIAGNOSIS — Z433 Encounter for attention to colostomy: Secondary | ICD-10-CM | POA: Diagnosis not present

## 2016-09-22 DIAGNOSIS — M545 Low back pain: Secondary | ICD-10-CM | POA: Diagnosis not present

## 2016-09-22 DIAGNOSIS — E440 Moderate protein-calorie malnutrition: Secondary | ICD-10-CM | POA: Diagnosis not present

## 2016-09-22 DIAGNOSIS — J449 Chronic obstructive pulmonary disease, unspecified: Secondary | ICD-10-CM | POA: Diagnosis not present

## 2016-09-22 DIAGNOSIS — E785 Hyperlipidemia, unspecified: Secondary | ICD-10-CM | POA: Diagnosis not present

## 2016-09-22 DIAGNOSIS — H3530 Unspecified macular degeneration: Secondary | ICD-10-CM | POA: Diagnosis not present

## 2016-09-22 DIAGNOSIS — M6281 Muscle weakness (generalized): Secondary | ICD-10-CM | POA: Diagnosis not present

## 2016-09-23 ENCOUNTER — Ambulatory Visit (INDEPENDENT_AMBULATORY_CARE_PROVIDER_SITE_OTHER): Payer: Medicare Other | Admitting: Internal Medicine

## 2016-09-23 ENCOUNTER — Encounter: Payer: Self-pay | Admitting: Internal Medicine

## 2016-09-23 VITALS — BP 116/54 | HR 110 | Temp 98.2°F | Wt 134.6 lb

## 2016-09-23 DIAGNOSIS — M8008XD Age-related osteoporosis with current pathological fracture, vertebra(e), subsequent encounter for fracture with routine healing: Secondary | ICD-10-CM

## 2016-09-23 DIAGNOSIS — E559 Vitamin D deficiency, unspecified: Secondary | ICD-10-CM | POA: Diagnosis not present

## 2016-09-23 DIAGNOSIS — Z79899 Other long term (current) drug therapy: Secondary | ICD-10-CM | POA: Diagnosis not present

## 2016-09-23 DIAGNOSIS — Z87891 Personal history of nicotine dependence: Secondary | ICD-10-CM

## 2016-09-23 DIAGNOSIS — M546 Pain in thoracic spine: Secondary | ICD-10-CM | POA: Diagnosis not present

## 2016-09-23 DIAGNOSIS — M8000XD Age-related osteoporosis with current pathological fracture, unspecified site, subsequent encounter for fracture with routine healing: Secondary | ICD-10-CM | POA: Diagnosis not present

## 2016-09-23 MED ORDER — ALENDRONATE SODIUM 70 MG PO TABS: 70.0000 mg | ORAL_TABLET | ORAL | 0 refills | Status: DC

## 2016-09-23 MED ORDER — VITAMIN D (ERGOCALCIFEROL) 50 MCG (2000 UT) PO CAPS: 2000.0000 [IU] | ORAL_CAPSULE | Freq: Every day | ORAL | 2 refills | Status: DC

## 2016-09-23 NOTE — Assessment & Plan Note (Signed)
Patient continued to have back pain because of compression fractures of T6, she had stable compression fractures of T5 and T12.  According to patient her Norco and [facility] Contin are helping her with the pain. She had one more month of prescription for her pain meds.  She was advised to continue taking her pain meds according to her prescription and tried to avoid taking more than her prescribed dose.

## 2016-09-23 NOTE — Patient Instructions (Signed)
Thank you for visiting clinic today. I am starting you again on a medicine to use to take before to help strengthen your bones called alendronate. We will check she your vitamin D level today, we will call you with any abnormal results. I also sent a prescription for vitamin D 2000 units daily to your pharmacy, please take it as directed. Please follow-up in one month with your PCP.

## 2016-09-23 NOTE — Progress Notes (Signed)
   CC: For follow-up of her back pain.  HPI:  [name] is a 71 y.o.elderly, blind lady came to the clinic for follow-up of her back pain. Patient has kyphosis and chronic back pain,thoracic x-ray obtained last month because of worsening of her symptoms and she was found to have another compression fracture at T6 with stable fractures of T5 and T12. Patient continued to get active pain in her thoracic region, worse with coughing and certain body movements. She is unable to bend because of the pain. According to patient her pain medications are helping with her pain. Patient lives independently with the help of a daily aide. She looks her simple meals and wants to stay independent as much as possible.  She has no new complaints today.  Past Medical History:  Diagnosis Date  . Acute sinusitis 06/30/2011  . Anxiety   . Basal cell carcinoma    "left cheek"  . Bleeding ulcer   . Blind in both eyes   . Chronic back pain   . Degenerative disk disease    This is interscapular, mild compression frx of T12 superior endplate with Schmorl's node. Seen on CT in 2/08  . Depression   . Duodenal ulcer 11/08   With hemorrhage and obstruction  . History of blood transfusion    "related to bleeding from intestines and vomiting blood"  . Ischemic colitis ([place]) 07/30/2011   2009 by endoscopy   . Macular degeneration, wet ([place])   . Osteomyelitis, jaw acute 11/08   Started while in the hospital abcess showed GNR . SP debridement by Dr. Lilli Few,  Priscella Mann S Bovis sp 4  weeks of Pen V started on 05/19/07  with additional 2 weeks in 07/04/07  . Perforated bowel [facility])    surgery july 2013  . Superior mesenteric artery syndrome ([place]) 11/08   sp dilation by Dr. Watt Climes, Pt may require bowel resetion if sx recur   Review of Systems:  Negative except mentioned in history of present illness.  Physical Exam:  Vitals:   09/23/16 1414  BP: (!) 116/54  Pulse: (!) 110  Temp: 98.2 F (36.8 C)  TempSrc: Oral    SpO2: 93%  Weight: 134 lb 9.6 oz (61.1 kg)   General: Vital signs reviewed.  Patient is well-developed and well-nourished, in no acute distress and cooperative with exam.  Head: Normocephalic and atraumatic. Cardiovascular: mild tachycardia with regular rhythm. Pulmonary/Chest: Clear to auscultation bilaterally, no wheezes, rales, or rhonchi. Abdominal: Soft, non-tender, non-distended, BS +, no masses, organomegaly, or guarding present.  Musculoskeletal: Tenderness in mid thoracic spinal and left paraspinal area. Extremities: No lower extremity edema bilaterally,  pulses symmetric and intact bilaterally. No cyanosis or clubbing. Skin:dry scaly skin in the legs bilaterally, no skin breakage.No rashes or erythema. Psychiatric: Normal mood and affect. speech and behavior is normal. Cognition and memory are normal.  Assessment & Plan:   See Encounters Tab for problem based charting.  Patient discussed with Dr. Daryll Drown.

## 2016-09-23 NOTE — Assessment & Plan Note (Addendum)
Patient has compression fractures because of osteoporosis. She was given alendronate 70 mg per week started in October 2016. According to patient she has finished taking them for long time. On review of chart that prescription was for 52 weeks and never renewed. She should take alendronate for at least 2 years before having a break. Her calcium and renal functions were normal according to a BMP check in May 2018.  According to patient she does take calcium supplement daily,she does not take any vitamin D supplement.  -We will check her vitamin D level today. -She was given a prescription for vitamin D 2000 units daily. We can adjust dosage after her results become available. -She was also given a prescription for alendronate or 1 year, it should be continued for another year before getting a pill holiday. -She should also get a repeat DEXA scan, maybe next year.  Addendum.Her vitamin D level came back very low at 11.I sent a prescription for ergocalciferol 50,000 units per week for 6 weeks. I called the patient and advised her to take it the 6 capsules on a weekly basis first and then start taking her daily vitamin D of 2000 units. We will repeat vitamin D level in 3 month.

## 2016-09-24 LAB — VITAMIN D 25 HYDROXY (VIT D DEFICIENCY, FRACTURES): Vit D, 25-Hydroxy: 11.8 ng/mL — ABNORMAL LOW (ref 30.0–100.0)

## 2016-09-24 MED ORDER — VITAMIN D (ERGOCALCIFEROL) 1.25 MG (50000 UNIT) PO CAPS: 50000.0000 [IU] | ORAL_CAPSULE | ORAL | 0 refills | Status: DC

## 2016-09-24 NOTE — Addendum Note (Signed)
Addended by: Lorella Nimrod on: 09/24/2016 09:00 AM   Modules accepted: Orders

## 2016-09-25 ENCOUNTER — Other Ambulatory Visit: Payer: Self-pay | Admitting: *Deleted

## 2016-09-25 MED ORDER — PROMETHAZINE HCL 12.5 MG PO TABS: 12.5000 mg | ORAL_TABLET | Freq: Four times a day (QID) | ORAL | 1 refills | Status: DC | PRN

## 2016-09-29 DIAGNOSIS — I10 Essential (primary) hypertension: Secondary | ICD-10-CM | POA: Diagnosis not present

## 2016-09-29 DIAGNOSIS — Z433 Encounter for attention to colostomy: Secondary | ICD-10-CM | POA: Diagnosis not present

## 2016-09-29 DIAGNOSIS — M810 Age-related osteoporosis without current pathological fracture: Secondary | ICD-10-CM | POA: Diagnosis not present

## 2016-09-29 DIAGNOSIS — E785 Hyperlipidemia, unspecified: Secondary | ICD-10-CM | POA: Diagnosis not present

## 2016-09-29 DIAGNOSIS — J449 Chronic obstructive pulmonary disease, unspecified: Secondary | ICD-10-CM | POA: Diagnosis not present

## 2016-09-29 DIAGNOSIS — H3530 Unspecified macular degeneration: Secondary | ICD-10-CM | POA: Diagnosis not present

## 2016-09-29 DIAGNOSIS — M6281 Muscle weakness (generalized): Secondary | ICD-10-CM | POA: Diagnosis not present

## 2016-09-29 DIAGNOSIS — M545 Low back pain: Secondary | ICD-10-CM | POA: Diagnosis not present

## 2016-09-29 DIAGNOSIS — E440 Moderate protein-calorie malnutrition: Secondary | ICD-10-CM | POA: Diagnosis not present

## 2016-09-30 NOTE — Progress Notes (Signed)
Internal Medicine Clinic Attending  Case discussed with Dr. Amin at the time of the visit.  We reviewed the resident's history and exam and pertinent patient test results.  I agree with the assessment, diagnosis, and plan of care documented in the resident's note.    

## 2016-10-23 ENCOUNTER — Telehealth: Payer: Self-pay

## 2016-10-23 NOTE — Telephone Encounter (Signed)
Faxed [facility] @ [phone], 10/23/2016.

## 2016-10-26 ENCOUNTER — Emergency Department (HOSPITAL_COMMUNITY): Payer: Medicare Other

## 2016-10-26 ENCOUNTER — Encounter (HOSPITAL_COMMUNITY): Payer: Self-pay | Admitting: Emergency Medicine

## 2016-10-26 ENCOUNTER — Emergency Department (HOSPITAL_COMMUNITY)
Admission: EM | Admit: 2016-10-26 | Discharge: 2016-10-26 | Disposition: A | Discharge status: Home / Self Care | Payer: Medicare Other | Attending: Emergency Medicine | Admitting: Emergency Medicine

## 2016-10-26 DIAGNOSIS — R531 Weakness: Secondary | ICD-10-CM | POA: Diagnosis not present

## 2016-10-26 DIAGNOSIS — J449 Chronic obstructive pulmonary disease, unspecified: Secondary | ICD-10-CM | POA: Diagnosis not present

## 2016-10-26 DIAGNOSIS — Z79899 Other long term (current) drug therapy: Secondary | ICD-10-CM | POA: Insufficient documentation

## 2016-10-26 DIAGNOSIS — R112 Nausea with vomiting, unspecified: Secondary | ICD-10-CM | POA: Diagnosis not present

## 2016-10-26 DIAGNOSIS — R1030 Lower abdominal pain, unspecified: Secondary | ICD-10-CM | POA: Diagnosis not present

## 2016-10-26 DIAGNOSIS — N898 Other specified noninflammatory disorders of vagina: Secondary | ICD-10-CM | POA: Diagnosis not present

## 2016-10-26 DIAGNOSIS — R109 Unspecified abdominal pain: Secondary | ICD-10-CM | POA: Diagnosis not present

## 2016-10-26 DIAGNOSIS — R100 Acute abdomen: Secondary | ICD-10-CM | POA: Diagnosis not present

## 2016-10-26 DIAGNOSIS — Z91040 Latex allergy status: Secondary | ICD-10-CM | POA: Diagnosis not present

## 2016-10-26 DIAGNOSIS — R05 Cough: Secondary | ICD-10-CM | POA: Diagnosis not present

## 2016-10-26 DIAGNOSIS — R1084 Generalized abdominal pain: Secondary | ICD-10-CM | POA: Diagnosis not present

## 2016-10-26 DIAGNOSIS — K5720 Diverticulitis of large intestine with perforation and abscess without bleeding: Secondary | ICD-10-CM | POA: Diagnosis not present

## 2016-10-26 DIAGNOSIS — Z87891 Personal history of nicotine dependence: Secondary | ICD-10-CM | POA: Diagnosis not present

## 2016-10-26 LAB — CBC WITH DIFFERENTIAL/PLATELET
BASOS ABS: 0 10*3/uL (ref 0.0–0.1)
BASOS PCT: 1 %
EOS ABS: 0 10*3/uL (ref 0.0–0.7)
EOS PCT: 0 %
HEMATOCRIT: 44.1 % (ref 36.0–46.0)
Hemoglobin: 14.7 g/dL (ref 12.0–15.0)
Lymphocytes Relative: 33 %
Lymphs Abs: 1.8 10*3/uL (ref 0.7–4.0)
MCH: 29.5 pg (ref 26.0–34.0)
MCHC: 33.3 g/dL (ref 30.0–36.0)
MCV: 88.4 fL (ref 78.0–100.0)
MONO ABS: 0.5 10*3/uL (ref 0.1–1.0)
MONOS PCT: 9 %
Neutro Abs: 3.1 10*3/uL (ref 1.7–7.7)
Neutrophils Relative %: 57 %
PLATELETS: 321 10*3/uL (ref 150–400)
RBC: 4.99 MIL/uL (ref 3.87–5.11)
RDW: 13.6 % (ref 11.5–15.5)
WBC: 5.4 10*3/uL (ref 4.0–10.5)

## 2016-10-26 LAB — URINALYSIS, ROUTINE W REFLEX MICROSCOPIC
Bilirubin Urine: NEGATIVE
GLUCOSE, UA: NEGATIVE mg/dL
Hgb urine dipstick: NEGATIVE
Ketones, ur: NEGATIVE mg/dL
LEUKOCYTES UA: NEGATIVE
Nitrite: NEGATIVE
PROTEIN: NEGATIVE mg/dL
SPECIFIC GRAVITY, URINE: 1.003 — AB (ref 1.005–1.030)
pH: 6 (ref 5.0–8.0)

## 2016-10-26 LAB — COMPREHENSIVE METABOLIC PANEL
ALBUMIN: 2.8 g/dL — AB (ref 3.5–5.0)
ALT: 12 U/L — ABNORMAL LOW (ref 14–54)
ANION GAP: 11 (ref 5–15)
AST: 24 U/L (ref 15–41)
Alkaline Phosphatase: 155 U/L — ABNORMAL HIGH (ref 38–126)
BUN: 7 mg/dL (ref 6–20)
CHLORIDE: 104 mmol/L (ref 101–111)
CO2: 28 mmol/L (ref 22–32)
Calcium: 8.7 mg/dL — ABNORMAL LOW (ref 8.9–10.3)
Creatinine, Ser: 0.7 mg/dL (ref 0.44–1.00)
GFR calc Af Amer: >60 mL/min (ref >60)
GFR calc non Af Amer: >60 mL/min (ref >60)
GLUCOSE: 99 mg/dL (ref 65–99)
POTASSIUM: 3.4 mmol/L — AB (ref 3.5–5.1)
SODIUM: 143 mmol/L (ref 135–145)
TOTAL PROTEIN: 6.2 g/dL — AB (ref 6.5–8.1)

## 2016-10-26 LAB — I-STAT TROPONIN, ED: Troponin i, poc: 0.01 ng/mL (ref 0.00–0.08)

## 2016-10-26 LAB — LIPASE, BLOOD: LIPASE: 23 U/L (ref 11–51)

## 2016-10-26 MED ORDER — HYDROCODONE-ACETAMINOPHEN 7.5-325 MG/15ML PO SOLN: 10.0000 mL | Freq: Once | ORAL | Status: DC

## 2016-10-26 MED ORDER — METRONIDAZOLE 500 MG PO TABS: 500.0000 mg | ORAL_TABLET | Freq: Three times a day (TID) | ORAL | 0 refills | Status: AC

## 2016-10-26 MED ORDER — METRONIDAZOLE IN NACL 5-0.79 MG/ML-% IV SOLN
500.0000 mg | Freq: Once | INTRAVENOUS | Status: AC
  Administered 2016-10-26: 500 mg via INTRAVENOUS
  Filled 2016-10-26 (×2): qty 100

## 2016-10-26 MED ORDER — HYDROCODONE-ACETAMINOPHEN 7.5-325 MG PO TABS
1.0000 | ORAL_TABLET | Freq: Once | ORAL | Status: AC
  Administered 2016-10-26: 1 via ORAL
  Filled 2016-10-26: qty 1

## 2016-10-26 MED ORDER — METOCLOPRAMIDE HCL 5 MG/ML IJ SOLN
10.0000 mg | Freq: Once | INTRAMUSCULAR | Status: AC
  Administered 2016-10-26: 10 mg via INTRAVENOUS
  Filled 2016-10-26: qty 2

## 2016-10-26 MED ORDER — CIPROFLOXACIN IN D5W 400 MG/200ML IV SOLN
400.0000 mg | Freq: Once | INTRAVENOUS | Status: AC
  Administered 2016-10-26: 400 mg via INTRAVENOUS
  Filled 2016-10-26: qty 200

## 2016-10-26 MED ORDER — MORPHINE SULFATE (PF) 4 MG/ML IV SOLN
4.0000 mg | Freq: Once | INTRAVENOUS | Status: AC
  Administered 2016-10-26: 4 mg via INTRAVENOUS
  Filled 2016-10-26: qty 1

## 2016-10-26 MED ORDER — CIPROFLOXACIN HCL 500 MG PO TABS: 500.0000 mg | ORAL_TABLET | Freq: Two times a day (BID) | ORAL | 0 refills | Status: AC

## 2016-10-26 MED ORDER — ONDANSETRON HCL 4 MG/2ML IJ SOLN
4.0000 mg | Freq: Once | INTRAMUSCULAR | Status: AC
  Administered 2016-10-26: 4 mg via INTRAVENOUS
  Filled 2016-10-26: qty 2

## 2016-10-26 MED ORDER — SODIUM CHLORIDE 0.9 % IV BOLUS (SEPSIS)
1000.0000 mL | Freq: Once | INTRAVENOUS | Status: AC
  Administered 2016-10-26: 1000 mL via INTRAVENOUS

## 2016-10-26 MED ORDER — MORPHINE SULFATE ER 15 MG PO TBCR
15.0000 mg | EXTENDED_RELEASE_TABLET | Freq: Once | ORAL | Status: AC
  Administered 2016-10-26: 15 mg via ORAL
  Filled 2016-10-26: qty 1

## 2016-10-26 MED ORDER — IOPAMIDOL (ISOVUE-300) INJECTION 61%
INTRAVENOUS | Status: AC
  Filled 2016-10-26: qty 100

## 2016-10-26 MED ORDER — IOPAMIDOL (ISOVUE-300) INJECTION 61%
100.0000 mL | Freq: Once | INTRAVENOUS | Status: AC | PRN
  Administered 2016-10-26: 100 mL via INTRAVENOUS

## 2016-10-26 MED ORDER — CIPROFLOXACIN IN D5W 400 MG/200ML IV SOLN: 500.0000 mg | Freq: Once | INTRAVENOUS | Status: DC

## 2016-10-26 MED ORDER — METOCLOPRAMIDE HCL 10 MG PO TABS: 10.0000 mg | ORAL_TABLET | Freq: Three times a day (TID) | ORAL | 0 refills | Status: DC | PRN

## 2016-10-26 NOTE — ED Triage Notes (Addendum)
Per [facility] pt from home c/o constant abdominal pain in all four quadrants for past 36 hrs. Pt has colostomy bag, c/o diarrhea. Hx of diverticulitis. Pt has taken home meds of zofran and phenergan, 1 episode of emesis this am.   Pt is also sight impaired. Can see dark light and shadows.

## 2016-10-26 NOTE — ED Notes (Signed)
Patient transported to X-ray 

## 2016-10-26 NOTE — ED Notes (Signed)
Patient transported to CT 
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
## 2016-10-26 NOTE — Discharge Instructions (Signed)
Take antibiotics as prescribed. You may use your at home Phenergan as needed for nausea and vomiting. If this is not helping, you may use Reglan. It is important that you stay well-hydrated. Try to drink plenty of water.  Follow up with your GI doctor tomorrow fur further evaluation of your symptoms and management of your pain.  Return to the ER if you develop high fevers, worsening symptoms, persistent vomiting despite medications, or any new or concerning symptoms.

## 2016-10-27 ENCOUNTER — Ambulatory Visit (INDEPENDENT_AMBULATORY_CARE_PROVIDER_SITE_OTHER): Payer: Medicare Other | Admitting: Internal Medicine

## 2016-10-27 ENCOUNTER — Encounter: Payer: Self-pay | Admitting: Internal Medicine

## 2016-10-27 VITALS — BP 137/41 | HR 114 | Temp 98.4°F | Ht 64.0 in | Wt 133.1 lb

## 2016-10-27 DIAGNOSIS — M8000XD Age-related osteoporosis with current pathological fracture, unspecified site, subsequent encounter for fracture with routine healing: Secondary | ICD-10-CM | POA: Diagnosis not present

## 2016-10-27 DIAGNOSIS — Z23 Encounter for immunization: Secondary | ICD-10-CM | POA: Diagnosis not present

## 2016-10-27 DIAGNOSIS — Z79891 Long term (current) use of opiate analgesic: Secondary | ICD-10-CM | POA: Diagnosis not present

## 2016-10-27 DIAGNOSIS — R109 Unspecified abdominal pain: Secondary | ICD-10-CM | POA: Diagnosis not present

## 2016-10-27 DIAGNOSIS — G8929 Other chronic pain: Secondary | ICD-10-CM | POA: Diagnosis not present

## 2016-10-27 DIAGNOSIS — E785 Hyperlipidemia, unspecified: Secondary | ICD-10-CM

## 2016-10-27 MED ORDER — MORPHINE SULFATE ER 15 MG PO TBCR: 30.0000 mg | EXTENDED_RELEASE_TABLET | Freq: Two times a day (BID) | ORAL | 0 refills | Status: DC

## 2016-10-27 MED ORDER — HYDROCODONE-ACETAMINOPHEN 7.5-325 MG PO TABS: 1.0000 | ORAL_TABLET | Freq: Three times a day (TID) | ORAL | 0 refills | Status: DC | PRN

## 2016-10-27 MED ORDER — VITAMIN D (ERGOCALCIFEROL) 50 MCG (2000 UT) PO CAPS: 2000.0000 [IU] | ORAL_CAPSULE | Freq: Every day | ORAL | 2 refills | Status: DC

## 2016-10-27 MED ORDER — PRAVASTATIN SODIUM 40 MG PO TABS: 40.0000 mg | ORAL_TABLET | Freq: Every day | ORAL | 5 refills | Status: DC

## 2016-10-27 MED ORDER — VITAMIN D (ERGOCALCIFEROL) 1.25 MG (50000 UNIT) PO CAPS: 50000.0000 [IU] | ORAL_CAPSULE | ORAL | 0 refills | Status: DC

## 2016-10-27 NOTE — Assessment & Plan Note (Signed)
ED records reviewed. Pt presented with abd pain, n/v. She was given IVF, anti-emetics and tolerated PO intake. She was afebrile and is afebrile today, normal WBC and other labs largely unremarkable. CT Abd did reveal wall thickening of rectal stump concerning for inflammation/infection. Provided with cipro/flagyl course for outpatient therapy. Pt has not filled prescriptions yet. Counseled pt that her workup in the ED was appropriate and re-assuring and the antibiotics are a good treatment for a potential cause of her sx. Her main concern was recurrent SBO which she is happy was ruled out.   --Complete cipro/flagyl course

## 2016-10-27 NOTE — Assessment & Plan Note (Signed)
[name] Bell is on chronic opioid therapy for chronic pain.  Date of pain contract was 03/17/16. As part of the treatment plan, the [place] controlled substance database is checked at least twice yearly and the database results are appropriate. [place] database last reviewed on 06/23/16.   The last UDS was on 09/24/15 and results were appropriate for her opioids. She is due for another UDS to ensure results are appropriate.   The patient is on hydrocodone/acetaminophen (Lorcet, Lortab, Norco, Vicodin) strength 7.5-325 mg, 81 per 30 days and MS Contin 15 mg 2 tablets Q12H, 120 per 30 days. Adjunctive treatment includes PT. This regimen allows [name] Bell to function and does not cause excessive sedation or other side effects.  "The benefits of continuing opioid therapy outweigh the risks and chronic opioids will be continued. Ongoing education about safe opioid treatment is provided. Will attempt to wean her doses of opiate medications as she progresses with physical therapy and is farther removed from her pathologic fracture.   Interventions today include: UDS and Refills - 3 paper Rx printed  Will follow up UDS results

## 2016-10-27 NOTE — Assessment & Plan Note (Signed)
Pt not currently on previously prescribed statin and last lipid panel in 2014 with hyperlipidemia. On CT Abd, aortic atherosclerosis was noted. She would likely benefit from re-initiation of statin for cardiovascular benefits   --Re-check lipid panel --Start Pravastatin 40 mg daily

## 2016-10-27 NOTE — Progress Notes (Signed)
   CC: F/u of chronic pain, abdominal pain    HPI:  [name] is a 71 y.o. F with past medical history as described below who presents to the clinic for follow up of her chronic medical conditions and ED visit   Abdominal Pain: Presented to ED yesterday for abdominal pain, n/v, and generalized weakness. She was evaluated and discharged with antibiotics for potential infection. She is still experiencing abdominal pain, worse in lower abdomen, but both pain and n/v has slightly  improved. She has not yet filled abx prescriptions.   Chronic Opiate Use, T12 Pathologic Fx: States her back pain is mildly improved but still present. She was found to have a pathologic thoracic spine fracture. She has been taking alendronate without issue. Was also prescribed Vit D supplementation but has not started this yet. She denies recent falls and has made progress with home physical therapy.   Past Medical History:  Diagnosis Date  . Acute sinusitis 06/30/2011  . Anxiety   . Basal cell carcinoma    "left cheek"  . Bleeding ulcer   . Blind in both eyes   . Chronic back pain   . Degenerative disk disease    This is interscapular, mild compression frx of T12 superior endplate with Schmorl's node. Seen on CT in 2/08  . Depression   . Duodenal ulcer 11/08   With hemorrhage and obstruction  . History of blood transfusion    "related to bleeding from intestines and vomiting blood"  . Ischemic colitis ([place]) 07/30/2011   2009 by endoscopy   . Macular degeneration, wet ([place])   . Osteomyelitis, jaw acute 11/08   Started while in the hospital abcess showed GNR . SP debridement by Dr. Lilli Few,  Priscella Mann S Bovis sp 4  weeks of Pen V started on 05/19/07  with additional 2 weeks in 07/04/07  . Perforated bowel [facility])    surgery july 2013  . Superior mesenteric artery syndrome ([place]) 11/08   sp dilation by Dr. Watt Climes, Pt may require bowel resetion if sx recur   Review of Systems:  Review of Systems  Constitutional:  Negative for fever.  Gastrointestinal: Positive for abdominal pain, nausea and vomiting.  Musculoskeletal: Positive for back pain. Negative for falls.     Physical Exam:  Vitals:   10/27/16 1404  BP: (!) 137/41  Pulse: (!) 114  Temp: 98.4 F (36.9 C)  TempSrc: Oral  SpO2: 100%  Weight: 133 lb 1.6 oz (60.4 kg)  Height: 5\' 4"  (1.626 m)   Physical Exam  Constitutional: She is oriented to person, place, and time.  Chronically ill appearing elderly female, sitting in chair, no acute distress   HENT:  Head: Normocephalic and atraumatic.  Pulmonary/Chest: Effort normal and breath sounds normal.  Abdominal: Bowel sounds are normal.  Soft, tenderness to palpation diffusely, worse in lower quadrants. Clean ostomy site at [facility] with bag in place   Musculoskeletal:  Midline spinal tenderness to palpation   Neurological: She is alert and oriented to person, place, and time.    Assessment & Plan:   See Encounters Tab for problem based charting.  Patient seen with Dr. Angelia Mould
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
## 2016-10-27 NOTE — Patient Instructions (Addendum)
Thank you for coming in today Ms. Giuliani  Go ahead and fill your prescriptions for the antibiotics for your abdominal pain   We refilled your pain medicines and also resent the Vitamin D to your pharmacy  We prescribed a cholesterol medicine called pravastatin that you will take 1 pill each day. We'll check your cholesterol today also.

## 2016-10-28 LAB — LIPID PANEL
CHOLESTEROL TOTAL: 188 mg/dL (ref 100–199)
Chol/HDL Ratio: 3 ratio (ref 0.0–4.4)
HDL: 63 mg/dL (ref >39)
LDL Calculated: 104 mg/dL — ABNORMAL HIGH (ref 0–99)
Triglycerides: 103 mg/dL (ref 0–149)
VLDL CHOLESTEROL CAL: 21 mg/dL (ref 5–40)

## 2016-10-28 NOTE — Progress Notes (Signed)
Internal Medicine Clinic Attending  I saw and evaluated the patient.  I personally confirmed the key portions of the history and exam documented by Dr. Harden and I reviewed pertinent patient test results.  The assessment, diagnosis, and plan were formulated together and I agree with the documentation in the resident's note.  

## 2016-10-31 LAB — TOXASSURE SELECT,+ANTIDEPR,UR

## 2016-11-23 ENCOUNTER — Other Ambulatory Visit: Payer: Self-pay | Admitting: *Deleted

## 2016-11-24 MED ORDER — PROMETHAZINE HCL 12.5 MG PO TABS: 12.5000 mg | ORAL_TABLET | Freq: Four times a day (QID) | ORAL | 3 refills | Status: DC | PRN

## 2017-01-29 ENCOUNTER — Ambulatory Visit (INDEPENDENT_AMBULATORY_CARE_PROVIDER_SITE_OTHER): Payer: Medicare Other | Admitting: Internal Medicine

## 2017-01-29 ENCOUNTER — Encounter (INDEPENDENT_AMBULATORY_CARE_PROVIDER_SITE_OTHER): Payer: Self-pay

## 2017-01-29 VITALS — BP 120/56 | HR 119 | Temp 98.6°F | Wt 141.1 lb

## 2017-01-29 DIAGNOSIS — Z931 Gastrostomy status: Secondary | ICD-10-CM

## 2017-01-29 DIAGNOSIS — Z87891 Personal history of nicotine dependence: Secondary | ICD-10-CM

## 2017-01-29 DIAGNOSIS — K469 Unspecified abdominal hernia without obstruction or gangrene: Secondary | ICD-10-CM | POA: Diagnosis not present

## 2017-01-29 DIAGNOSIS — J441 Chronic obstructive pulmonary disease with (acute) exacerbation: Secondary | ICD-10-CM | POA: Diagnosis not present

## 2017-01-29 DIAGNOSIS — J449 Chronic obstructive pulmonary disease, unspecified: Secondary | ICD-10-CM

## 2017-01-29 DIAGNOSIS — R000 Tachycardia, unspecified: Secondary | ICD-10-CM | POA: Diagnosis not present

## 2017-01-29 DIAGNOSIS — G8929 Other chronic pain: Secondary | ICD-10-CM | POA: Diagnosis not present

## 2017-01-29 DIAGNOSIS — Z79891 Long term (current) use of opiate analgesic: Secondary | ICD-10-CM | POA: Diagnosis not present

## 2017-01-29 MED ORDER — MORPHINE SULFATE ER 15 MG PO TBCR: 30.0000 mg | EXTENDED_RELEASE_TABLET | Freq: Two times a day (BID) | ORAL | 0 refills | Status: DC

## 2017-01-29 MED ORDER — AZITHROMYCIN 250 MG PO TABS: ORAL_TABLET | ORAL | 0 refills | Status: DC

## 2017-01-29 MED ORDER — PREDNISONE 20 MG PO TABS: 20.0000 mg | ORAL_TABLET | Freq: Every day | ORAL | 0 refills | Status: DC

## 2017-01-29 MED ORDER — HYDROCODONE-ACETAMINOPHEN 7.5-325 MG PO TABS: 1.0000 | ORAL_TABLET | Freq: Three times a day (TID) | ORAL | 0 refills | Status: DC | PRN

## 2017-01-29 MED ORDER — ALBUTEROL SULFATE HFA 108 (90 BASE) MCG/ACT IN AERS: 2.0000 | INHALATION_SPRAY | Freq: Four times a day (QID) | RESPIRATORY_TRACT | 3 refills | Status: DC | PRN

## 2017-01-29 NOTE — Progress Notes (Signed)
   CC: Chest congestion   HPI:  [name] is a 72 y.o. F with a past medical history as described below who presents to the clinic with complaints of chest congestion.   She reports a 3-day history of chest congestion and intermittent cough with sputum production of yellow/green phlegm.  She has been using Mucinex which was initially effective, but she felt worse last night with increased shortness of breath and chest tightness, difficulty lying down, and was not expelling sputum has effectively.  She denies fever, myalgias, sore throat, nausea, or other associated symptoms.  She has used her albuterol inhaler more often over the last several days and is requesting a refill.  She also reports that she has about 1 dose of her chronic pain medication remaining.   Past Medical History:  Diagnosis Date  . Acute sinusitis 06/30/2011  . Anxiety   . Basal cell carcinoma    "left cheek"  . Bleeding ulcer   . Blind in both eyes   . Chronic back pain   . Degenerative disk disease    This is interscapular, mild compression frx of T12 superior endplate with Schmorl's node. Seen on CT in 2/08  . Depression   . Duodenal ulcer 11/08   With hemorrhage and obstruction  . History of blood transfusion    "related to bleeding from intestines and vomiting blood"  . Ischemic colitis ([place]) 07/30/2011   2009 by endoscopy   . Macular degeneration, wet ([place])   . Osteomyelitis, jaw acute 11/08   Started while in the hospital abcess showed GNR . SP debridement by Dr. Lilli Few,  Priscella Mann S Bovis sp 4  weeks of Pen V started on 05/19/07  with additional 2 weeks in 07/04/07  . Perforated bowel [facility])    surgery july 2013  . Superior mesenteric artery syndrome ([place]) 11/08   sp dilation by Dr. Watt Climes, Pt may require bowel resetion if sx recur   Review of Systems:  Review of Systems  Constitutional: Negative for chills and fever.  HENT: Negative for sore throat.   Respiratory: Positive for cough, sputum production  and shortness of breath.   Musculoskeletal: Negative for myalgias.     Physical Exam:  Vitals:   01/29/17 1450  BP: (!) 120/56  Pulse: (!) 119  Temp: 98.6 F (37 C)  TempSrc: Oral  SpO2: 95%  Weight: 141 lb 1.6 oz (64 kg)   General: Elderly, chronically ill appearing female, no acute distress HEENT: No pharyngeal exudate, moist mucus membranes  CV: Tachycardic, regular rhythm Resp: Clear breath sounds bilaterally with no wheeze or crackles, normal work of breathing, no distress  Abd: Stable hernia about ostomy site, bag in place  Neuro: Alert and oriented x3 Skin: Warm, dry      Assessment & Plan:   See Encounters Tab for problem based charting.  Patient discussed with Dr. Daryll Drown

## 2017-01-29 NOTE — Patient Instructions (Addendum)
Good to see you again today [name] Bell.  For your chest congestion we are going to prescribe both an antibiotic and prednisone to take for the next couple days to help resolve this.  I also prescribed a short-term refill of your pain medications and a refill of your albuterol to use if you feel short of breath.  I will see you back next week to see how you are doing and to talk more about your other medical problems and provide long-term supplies of your pain medicine.

## 2017-01-30 NOTE — Assessment & Plan Note (Signed)
Patient has a history of COPD currently controlled with as needed albuterol, she has not required hospitalization for her infrequent exacerbations.  She is presenting with an increase in sputum production and dyspnea consistent with a mild COPD exacerbation.  She is saturating well on room air without wheezes on exam and is nontoxic appearing.  We will provide a refill for her albuterol inhaler to use as needed, as well as prescribed a course of azithromycin and steroid for COPD exacerbation treatment.  She was encouraged to continue Mucinex.  We will continue to monitor her COPD symptoms and frequency of exacerbations should an escalation in maintenance therapy be required  --Azithromycin 500 mg followed by 250 mg x 5 days total  --Prednisone 20 mg x 4 days  --Refill albuterol inhaler --Cont mucinex

## 2017-01-30 NOTE — Assessment & Plan Note (Signed)
Patient has approximately 1 dose of her chronic pain medications left which is appropriate as her last appointment was 3 months ago.  She has a PCP appointment in 4-5 days for ongoing management of her pain regimen and other chronic issues.  We will provide a short supply of her pain medications to last until this appointment and assess at her next visit.

## 2017-02-01 NOTE — Progress Notes (Signed)
Internal Medicine Clinic Attending  Case discussed with Dr. Harden at the time of the visit.  We reviewed the resident's history and exam and pertinent patient test results.  I agree with the assessment, diagnosis, and plan of care documented in the resident's note.  

## 2017-02-01 NOTE — Addendum Note (Signed)
Addended by: Gilles Chiquito B on: 02/01/2017 09:20 AM   Modules accepted: Level of Service

## 2017-02-02 ENCOUNTER — Encounter: Payer: Self-pay | Admitting: Internal Medicine

## 2017-02-02 ENCOUNTER — Ambulatory Visit (INDEPENDENT_AMBULATORY_CARE_PROVIDER_SITE_OTHER): Payer: Medicare Other | Admitting: Internal Medicine

## 2017-02-02 ENCOUNTER — Other Ambulatory Visit: Payer: Self-pay

## 2017-02-02 VITALS — BP 123/44 | HR 100 | Temp 98.4°F | Ht 64.0 in | Wt 145.9 lb

## 2017-02-02 DIAGNOSIS — M810 Age-related osteoporosis without current pathological fracture: Secondary | ICD-10-CM | POA: Diagnosis not present

## 2017-02-02 DIAGNOSIS — Z87310 Personal history of (healed) osteoporosis fracture: Secondary | ICD-10-CM

## 2017-02-02 DIAGNOSIS — Z79899 Other long term (current) drug therapy: Secondary | ICD-10-CM | POA: Diagnosis not present

## 2017-02-02 DIAGNOSIS — Z79891 Long term (current) use of opiate analgesic: Secondary | ICD-10-CM | POA: Diagnosis not present

## 2017-02-02 DIAGNOSIS — Z87891 Personal history of nicotine dependence: Secondary | ICD-10-CM

## 2017-02-02 DIAGNOSIS — H547 Unspecified visual loss: Secondary | ICD-10-CM | POA: Diagnosis not present

## 2017-02-02 DIAGNOSIS — Z7983 Long term (current) use of bisphosphonates: Secondary | ICD-10-CM

## 2017-02-02 DIAGNOSIS — G8929 Other chronic pain: Secondary | ICD-10-CM

## 2017-02-02 DIAGNOSIS — Z969 Presence of functional implant, unspecified: Secondary | ICD-10-CM

## 2017-02-02 DIAGNOSIS — M8000XD Age-related osteoporosis with current pathological fracture, unspecified site, subsequent encounter for fracture with routine healing: Secondary | ICD-10-CM

## 2017-02-02 DIAGNOSIS — K469 Unspecified abdominal hernia without obstruction or gangrene: Secondary | ICD-10-CM | POA: Diagnosis not present

## 2017-02-02 DIAGNOSIS — J449 Chronic obstructive pulmonary disease, unspecified: Secondary | ICD-10-CM

## 2017-02-02 DIAGNOSIS — E785 Hyperlipidemia, unspecified: Secondary | ICD-10-CM | POA: Diagnosis not present

## 2017-02-02 DIAGNOSIS — Z0000 Encounter for general adult medical examination without abnormal findings: Secondary | ICD-10-CM

## 2017-02-02 DIAGNOSIS — K219 Gastro-esophageal reflux disease without esophagitis: Secondary | ICD-10-CM

## 2017-02-02 MED ORDER — MORPHINE SULFATE ER 15 MG PO TBCR: 30.0000 mg | EXTENDED_RELEASE_TABLET | Freq: Two times a day (BID) | ORAL | 0 refills | Status: DC

## 2017-02-02 MED ORDER — HYDROCODONE-ACETAMINOPHEN 7.5-325 MG PO TABS: 1.0000 | ORAL_TABLET | Freq: Three times a day (TID) | ORAL | 0 refills | Status: DC | PRN

## 2017-02-02 MED ORDER — ESOMEPRAZOLE MAGNESIUM 40 MG PO CPDR: 40.0000 mg | DELAYED_RELEASE_CAPSULE | Freq: Two times a day (BID) | ORAL | 2 refills | Status: DC

## 2017-02-02 NOTE — Patient Instructions (Addendum)
Good to see you again today [name] Bell.  I am glad you are feeling better and your chest congestion and breathing is improved.  We will keep an eye on your breathing to make sure you do not have any difficulties more frequently.  I also put in an order for a mammogram to get you up-to-date on screening.  We refilled your pain medications and your Nexium.  We checked the database to make sure you are up-to-date with the pain medication clinic protocol.

## 2017-02-02 NOTE — Assessment & Plan Note (Signed)
The patient is on alendronate and tolerating well.  After delay in starting vitamin D supplementation, she has started and is taking as prescribed.  She has completed the weekly 50,000 units and is on 2000 units daily.  Will consider repeating level of follow-up.  Continued to be counseled on importance of avoiding falls and safety at home, as well as the association between opiates and falls.

## 2017-02-02 NOTE — Progress Notes (Signed)
CC: Follow-up of chronic opiate use and other chronic conditions  HPI:  [name] Bell is a 72 y.o. F with past medical history   Chronic Opiate Use: Ms. Laning is on chronic opiate pain with both long-acting morphine and short-acting oxycodone for chronic back pain with associated pathologic fracture.  She denies any issues with constipation, drowsiness, or recent falls since her last visit.  While her pain is still present, she is still able to function using her walker at her assisted living facility.  COPD: She was evaluated last week with increased sputum production and dyspnea and treated for a mild COPD exacerbation.  She has finished her course of antibiotics and prednisone.  She states her symptoms are much improved with decreased thickness and amount of sputum and is using her albuterol inhaler less often.  She denies any wheezing or dyspnea at present.  Osteoporosis: She has a history of osteoporosis with a pathologic fracture, most recently of T12.  She reports adherence with alendronate and recently started vitamin D supplementation.  She denies any recent falls or injuries.  HLD: She was also recently restarted on a statin, LDL checked at her last visit was improved compared to prior.  She denies muscle aches or side effects.  HM: She is due for a screening mammogram.  It appears this was ordered several years ago but was not performed.   Past Medical History:  Diagnosis Date  . Acute sinusitis 06/30/2011  . Anxiety   . Basal cell carcinoma    "left cheek"  . Bleeding ulcer   . Blind in both eyes   . Chronic back pain   . Degenerative disk disease    This is interscapular, mild compression frx of T12 superior endplate with Schmorl's node. Seen on CT in 2/08  . Depression   . Duodenal ulcer 11/08   With hemorrhage and obstruction  . History of blood transfusion    "related to bleeding from intestines and vomiting blood"  . Ischemic colitis ([place]) 07/30/2011   2009 by  endoscopy   . Macular degeneration, wet ([place])   . Osteomyelitis, jaw acute 11/08   Started while in the hospital abcess showed GNR . SP debridement by Dr. Lilli Few,  Priscella Mann S Bovis sp 4  weeks of Pen V started on 05/19/07  with additional 2 weeks in 07/04/07  . Perforated bowel [facility])    surgery july 2013  . Superior mesenteric artery syndrome ([place]) 11/08   sp dilation by Dr. Watt Climes, Pt may require bowel resetion if sx recur   Review of Systems:  Review of Systems  Respiratory: Positive for sputum production (Improved). Negative for wheezing.   Gastrointestinal: Negative for constipation.  Musculoskeletal: Negative for falls.  Neurological: Negative for dizziness.     Physical Exam:  Vitals:   02/02/17 1437  BP: (!) 123/44  Pulse: 100  Temp: 98.4 F (36.9 C)  TempSrc: Oral  SpO2: 97%  Weight: 145 lb 14.4 oz (66.2 kg)  Height: 5\' 4"  (1.626 m)   General: Elderly and chronically ill-appearing female sitting in chair comfortably, no acute distress HEENT: Normal conjunctiva, decreased vision (chronic) CV: Slightly tachycardic (baseline), regular rhythm Resp: Clear breath sounds bilaterally without wheezing, normal work of breathing, no distress  Abd: Soft, ostomy with underlying hernia, stable from prior Neuro: Alert and oriented x3 Skin: Warm, dry      Assessment & Plan:   See Encounters Tab for problem based charting.  Patient discussed with Dr. Eppie Gibson

## 2017-02-02 NOTE — Assessment & Plan Note (Signed)
Patient due for mammogram.  This was previously ordered but was not performed.  Will replace order for bilateral screening mammogram

## 2017-02-02 NOTE — Assessment & Plan Note (Addendum)
[name] G Buckneris on chronic opioid therapy for chronic pain. Date of pain contract was 03/17/16. As part of the treatment plan, the [place] controlled substance database is checked at least twice yearly and the database results areappropriate. [place] database last reviewed on 02/02/2017.   The last UDS was on 10/02/2018and results were appropriate for her opioids and buproprion (result page indicates unexpected for buproprion though this was mistakenly omitted from supplemental lab paperwork).  The patient is onhydrocodone/acetaminophen (Lorcet, Lortab, Norco, Vicodin)strength 7.5-325 mg, 81per 30 days and MS Contin 15 mg 2 tablets Q12H, 120 per 30 days. Adjunctive treatment includes PT. This regimen allows [name] G Bucknerto function and does not cause excessive sedation or other side effects.  "The benefits of continuing opioid therapy outweigh the risks and chronic opioids will be continued. Ongoing education about safe opioid treatment is provided. Will attempt to wean her doses of opiate medications as she progresses with physical therapy and is farther removed from her pathologic fracture.   Interventions today include: 3 paper Rx printedfor above doses and #s  Database checked and appropriate with no red flags

## 2017-02-02 NOTE — Assessment & Plan Note (Signed)
She was recently treated for mild COPD exacerbation which has improved with a 5-day course of azithromycin and 4 days of prednisone 20 mg.  This exacerbation occurred after a year since her last exacerbation, neither of which required hospitalization and we will be treated as outpatient.  We will continue to monitor her breathing and frequency of exacerbations and escalate with maintenance therapy if indicated. Cont albuterol prn

## 2017-02-08 NOTE — Progress Notes (Signed)
Case discussed with Dr. Johny Chess at the time of the visit.  We reviewed the resident's history and exam and pertinent patient test results.  I agree with the assessment, diagnosis, and plan of care documented in the resident's note.

## 2017-02-10 ENCOUNTER — Other Ambulatory Visit: Payer: Self-pay | Admitting: Internal Medicine

## 2017-02-10 DIAGNOSIS — Z1231 Encounter for screening mammogram for malignant neoplasm of breast: Secondary | ICD-10-CM

## 2017-03-03 ENCOUNTER — Ambulatory Visit: Payer: Medicare Other

## 2017-03-05 ENCOUNTER — Other Ambulatory Visit: Payer: Self-pay | Admitting: *Deleted

## 2017-03-05 MED ORDER — PROMETHAZINE HCL 12.5 MG PO TABS: 12.5000 mg | ORAL_TABLET | Freq: Four times a day (QID) | ORAL | 1 refills | Status: DC | PRN

## 2017-03-05 NOTE — Telephone Encounter (Signed)
Next appt scheduled  04/27/17 with PCP.

## 2017-03-24 ENCOUNTER — Other Ambulatory Visit: Payer: Self-pay

## 2017-03-24 ENCOUNTER — Encounter: Payer: Self-pay | Admitting: Internal Medicine

## 2017-03-24 ENCOUNTER — Encounter (INDEPENDENT_AMBULATORY_CARE_PROVIDER_SITE_OTHER): Payer: Self-pay

## 2017-03-24 ENCOUNTER — Ambulatory Visit (INDEPENDENT_AMBULATORY_CARE_PROVIDER_SITE_OTHER): Payer: Medicare Other | Admitting: Internal Medicine

## 2017-03-24 VITALS — BP 115/50 | HR 102 | Temp 98.3°F | Ht 64.0 in | Wt 145.7 lb

## 2017-03-24 DIAGNOSIS — Z933 Colostomy status: Secondary | ICD-10-CM | POA: Diagnosis not present

## 2017-03-24 DIAGNOSIS — Z79899 Other long term (current) drug therapy: Secondary | ICD-10-CM

## 2017-03-24 DIAGNOSIS — M549 Dorsalgia, unspecified: Secondary | ICD-10-CM | POA: Diagnosis not present

## 2017-03-24 DIAGNOSIS — G8929 Other chronic pain: Secondary | ICD-10-CM

## 2017-03-24 DIAGNOSIS — Z760 Encounter for issue of repeat prescription: Secondary | ICD-10-CM | POA: Diagnosis not present

## 2017-03-24 DIAGNOSIS — M546 Pain in thoracic spine: Principal | ICD-10-CM

## 2017-03-24 DIAGNOSIS — M8588 Other specified disorders of bone density and structure, other site: Secondary | ICD-10-CM | POA: Diagnosis not present

## 2017-03-24 DIAGNOSIS — E559 Vitamin D deficiency, unspecified: Secondary | ICD-10-CM | POA: Diagnosis not present

## 2017-03-24 DIAGNOSIS — M4854XS Collapsed vertebra, not elsewhere classified, thoracic region, sequela of fracture: Secondary | ICD-10-CM | POA: Diagnosis not present

## 2017-03-24 NOTE — Assessment & Plan Note (Signed)
Colostomy appears pink and clean.  Brown stool noted in the bag.  Patient is requesting new colostomy bags.  Plan -Order has been placed

## 2017-03-24 NOTE — Assessment & Plan Note (Signed)
Patient has chronic back pain secondary to thoracic compression fractures due to osteopenia.  She is currently taking Fosamax, vitamin D 50,000 units weekly, and a calcium supplement.  She is requesting a referral to home health physical therapy.  Plan -Home health PT -Continue Fosamax, vitamin D, and calcium supplement -Check vitamin D level as she is currently on high-dose supplementation

## 2017-03-24 NOTE — Patient Instructions (Addendum)
Ms. Delpriore it was nice meeting you today.  -Our clinic will make sure you get colostomy bags.  -Referral to home health physical therapy has been placed.   FOLLOW-UP INSTRUCTIONS When: April 27, 2017 at 2:15 PM For: Regular checkup with Dr. Johny Chess What to bring: Medications

## 2017-03-24 NOTE — Progress Notes (Signed)
   CC: Needs colostomy bags and referral to home health physical therapy.   HPI:  [name] is a 72 y.o. female with a past medical history of conditions listed below presenting to the clinic requesting colostomy bags and referral to home health physical therapy. Please see problem based charting for the status of the patient's current and chronic medical conditions.   Past Medical History:  Diagnosis Date  . Acute sinusitis 06/30/2011  . Anxiety   . Basal cell carcinoma    "left cheek"  . Bleeding ulcer   . Blind in both eyes   . Chronic back pain   . Degenerative disk disease    This is interscapular, mild compression frx of T12 superior endplate with Schmorl's node. Seen on CT in 2/08  . Depression   . Duodenal ulcer 11/08   With hemorrhage and obstruction  . History of blood transfusion    "related to bleeding from intestines and vomiting blood"  . Ischemic colitis ([place]) 07/30/2011   2009 by endoscopy   . Macular degeneration, wet ([place])   . Osteomyelitis, jaw acute 11/08   Started while in the hospital abcess showed GNR . SP debridement by Dr. Lilli Few,  Priscella Mann S Bovis sp 4  weeks of Pen V started on 05/19/07  with additional 2 weeks in 07/04/07  . Perforated bowel [facility])    surgery july 2013  . Superior mesenteric artery syndrome ([facility]) 11/08   sp dilation by Dr. Watt Climes, Pt may require bowel resetion if sx recur   Review of Systems: Pertinent positives mentioned in HPI. Remainder of all ROS negative.   Physical Exam:  Vitals:   03/24/17 1416  BP: (!) 115/50  Pulse: (!) 102  Temp: 98.3 F (36.8 C)  TempSrc: Oral  SpO2: 95%  Weight: 145 lb 11.2 oz (66.1 kg)  Height: 5\' 4"  (1.626 m)   Physical Exam  Constitutional: She is oriented to person, place, and time. She appears well-developed and well-nourished. No distress.  HENT:  Head: Normocephalic and atraumatic.  Mouth/Throat: Oropharynx is clear and moist.  Eyes: Right eye exhibits no discharge. Left eye exhibits no  discharge.  Cardiovascular: Normal rate, regular rhythm and intact distal pulses.  Pulmonary/Chest: Effort normal and breath sounds normal. No respiratory distress. She has no wheezes. She has no rales.  Abdominal: Soft. Bowel sounds are normal. She exhibits no distension. There is no tenderness.  Colostomy appears pink and clean.  Brown stool noted in the bag.  Musculoskeletal: She exhibits no edema.  Neurological: She is alert and oriented to person, place, and time.  Skin: Skin is warm and dry.    Assessment & Plan:   See Encounters Tab for problem based charting.  Patient discussed with Dr. Dareen Piano

## 2017-03-25 LAB — VITAMIN D 25 HYDROXY (VIT D DEFICIENCY, FRACTURES): VIT D 25 HYDROXY: 15.1 ng/mL — AB (ref 30.0–100.0)

## 2017-03-25 NOTE — Progress Notes (Signed)
Internal Medicine Clinic Attending  Case discussed with Dr. Rathoreat the time of the visit. We reviewed the resident's history and exam and pertinent patient test results. I agree with the assessment, diagnosis, and plan of care documented in the resident's note.  

## 2017-03-26 DIAGNOSIS — Z933 Colostomy status: Secondary | ICD-10-CM | POA: Diagnosis not present

## 2017-03-30 ENCOUNTER — Other Ambulatory Visit: Payer: Self-pay | Admitting: *Deleted

## 2017-03-30 DIAGNOSIS — R000 Tachycardia, unspecified: Secondary | ICD-10-CM

## 2017-03-30 MED ORDER — METOPROLOL TARTRATE 25 MG PO TABS: 12.5000 mg | ORAL_TABLET | Freq: Two times a day (BID) | ORAL | 3 refills | Status: DC

## 2017-03-30 NOTE — Telephone Encounter (Signed)
Received fax from pt's pharmacy with the following message "This prescription was filled today (03/29/2017).  Any refills authorized will be placed on file." Will send to pcp for review and additional refills if appropriate.  Please advise. Regenia Skeeter, Darlene Cassady3/5/20198:52 AM

## 2017-04-07 DIAGNOSIS — M546 Pain in thoracic spine: Secondary | ICD-10-CM | POA: Diagnosis not present

## 2017-04-07 DIAGNOSIS — K631 Perforation of intestine (nontraumatic): Secondary | ICD-10-CM | POA: Diagnosis not present

## 2017-04-07 DIAGNOSIS — K529 Noninfective gastroenteritis and colitis, unspecified: Secondary | ICD-10-CM | POA: Diagnosis not present

## 2017-04-07 DIAGNOSIS — M13869 Other specified arthritis, unspecified knee: Secondary | ICD-10-CM | POA: Diagnosis not present

## 2017-04-07 DIAGNOSIS — J4530 Mild persistent asthma, uncomplicated: Secondary | ICD-10-CM | POA: Diagnosis not present

## 2017-04-07 DIAGNOSIS — K4031 Unilateral inguinal hernia, with obstruction, without gangrene, recurrent: Secondary | ICD-10-CM | POA: Diagnosis not present

## 2017-04-07 DIAGNOSIS — H3530 Unspecified macular degeneration: Secondary | ICD-10-CM | POA: Diagnosis not present

## 2017-04-07 DIAGNOSIS — I10 Essential (primary) hypertension: Secondary | ICD-10-CM | POA: Diagnosis not present

## 2017-04-07 DIAGNOSIS — M6281 Muscle weakness (generalized): Secondary | ICD-10-CM | POA: Diagnosis not present

## 2017-04-07 DIAGNOSIS — R262 Difficulty in walking, not elsewhere classified: Secondary | ICD-10-CM | POA: Diagnosis not present

## 2017-04-12 DIAGNOSIS — M6281 Muscle weakness (generalized): Secondary | ICD-10-CM | POA: Diagnosis not present

## 2017-04-12 DIAGNOSIS — M546 Pain in thoracic spine: Secondary | ICD-10-CM | POA: Diagnosis not present

## 2017-04-12 DIAGNOSIS — H3530 Unspecified macular degeneration: Secondary | ICD-10-CM | POA: Diagnosis not present

## 2017-04-12 DIAGNOSIS — K529 Noninfective gastroenteritis and colitis, unspecified: Secondary | ICD-10-CM | POA: Diagnosis not present

## 2017-04-12 DIAGNOSIS — I10 Essential (primary) hypertension: Secondary | ICD-10-CM | POA: Diagnosis not present

## 2017-04-12 DIAGNOSIS — K631 Perforation of intestine (nontraumatic): Secondary | ICD-10-CM | POA: Diagnosis not present

## 2017-04-12 DIAGNOSIS — M13869 Other specified arthritis, unspecified knee: Secondary | ICD-10-CM | POA: Diagnosis not present

## 2017-04-12 DIAGNOSIS — K4031 Unilateral inguinal hernia, with obstruction, without gangrene, recurrent: Secondary | ICD-10-CM | POA: Diagnosis not present

## 2017-04-12 DIAGNOSIS — R262 Difficulty in walking, not elsewhere classified: Secondary | ICD-10-CM | POA: Diagnosis not present

## 2017-04-12 DIAGNOSIS — J4530 Mild persistent asthma, uncomplicated: Secondary | ICD-10-CM | POA: Diagnosis not present

## 2017-04-16 DIAGNOSIS — I10 Essential (primary) hypertension: Secondary | ICD-10-CM | POA: Diagnosis not present

## 2017-04-16 DIAGNOSIS — K631 Perforation of intestine (nontraumatic): Secondary | ICD-10-CM | POA: Diagnosis not present

## 2017-04-16 DIAGNOSIS — M6281 Muscle weakness (generalized): Secondary | ICD-10-CM | POA: Diagnosis not present

## 2017-04-16 DIAGNOSIS — M13869 Other specified arthritis, unspecified knee: Secondary | ICD-10-CM | POA: Diagnosis not present

## 2017-04-16 DIAGNOSIS — K4031 Unilateral inguinal hernia, with obstruction, without gangrene, recurrent: Secondary | ICD-10-CM | POA: Diagnosis not present

## 2017-04-16 DIAGNOSIS — J4530 Mild persistent asthma, uncomplicated: Secondary | ICD-10-CM | POA: Diagnosis not present

## 2017-04-16 DIAGNOSIS — H3530 Unspecified macular degeneration: Secondary | ICD-10-CM | POA: Diagnosis not present

## 2017-04-16 DIAGNOSIS — K529 Noninfective gastroenteritis and colitis, unspecified: Secondary | ICD-10-CM | POA: Diagnosis not present

## 2017-04-16 DIAGNOSIS — R262 Difficulty in walking, not elsewhere classified: Secondary | ICD-10-CM | POA: Diagnosis not present

## 2017-04-16 DIAGNOSIS — M546 Pain in thoracic spine: Secondary | ICD-10-CM | POA: Diagnosis not present

## 2017-04-20 DIAGNOSIS — K529 Noninfective gastroenteritis and colitis, unspecified: Secondary | ICD-10-CM | POA: Diagnosis not present

## 2017-04-20 DIAGNOSIS — I10 Essential (primary) hypertension: Secondary | ICD-10-CM | POA: Diagnosis not present

## 2017-04-20 DIAGNOSIS — M13869 Other specified arthritis, unspecified knee: Secondary | ICD-10-CM | POA: Diagnosis not present

## 2017-04-20 DIAGNOSIS — K631 Perforation of intestine (nontraumatic): Secondary | ICD-10-CM | POA: Diagnosis not present

## 2017-04-20 DIAGNOSIS — K4031 Unilateral inguinal hernia, with obstruction, without gangrene, recurrent: Secondary | ICD-10-CM | POA: Diagnosis not present

## 2017-04-20 DIAGNOSIS — M546 Pain in thoracic spine: Secondary | ICD-10-CM | POA: Diagnosis not present

## 2017-04-20 DIAGNOSIS — R262 Difficulty in walking, not elsewhere classified: Secondary | ICD-10-CM | POA: Diagnosis not present

## 2017-04-20 DIAGNOSIS — M6281 Muscle weakness (generalized): Secondary | ICD-10-CM | POA: Diagnosis not present

## 2017-04-20 DIAGNOSIS — J4530 Mild persistent asthma, uncomplicated: Secondary | ICD-10-CM | POA: Diagnosis not present

## 2017-04-20 DIAGNOSIS — H3530 Unspecified macular degeneration: Secondary | ICD-10-CM | POA: Diagnosis not present

## 2017-04-23 DIAGNOSIS — I10 Essential (primary) hypertension: Secondary | ICD-10-CM | POA: Diagnosis not present

## 2017-04-23 DIAGNOSIS — M546 Pain in thoracic spine: Secondary | ICD-10-CM | POA: Diagnosis not present

## 2017-04-23 DIAGNOSIS — K4031 Unilateral inguinal hernia, with obstruction, without gangrene, recurrent: Secondary | ICD-10-CM | POA: Diagnosis not present

## 2017-04-23 DIAGNOSIS — K631 Perforation of intestine (nontraumatic): Secondary | ICD-10-CM | POA: Diagnosis not present

## 2017-04-23 DIAGNOSIS — M6281 Muscle weakness (generalized): Secondary | ICD-10-CM | POA: Diagnosis not present

## 2017-04-23 DIAGNOSIS — J4530 Mild persistent asthma, uncomplicated: Secondary | ICD-10-CM | POA: Diagnosis not present

## 2017-04-23 DIAGNOSIS — M13869 Other specified arthritis, unspecified knee: Secondary | ICD-10-CM | POA: Diagnosis not present

## 2017-04-23 DIAGNOSIS — R262 Difficulty in walking, not elsewhere classified: Secondary | ICD-10-CM | POA: Diagnosis not present

## 2017-04-23 DIAGNOSIS — K529 Noninfective gastroenteritis and colitis, unspecified: Secondary | ICD-10-CM | POA: Diagnosis not present

## 2017-04-23 DIAGNOSIS — H3530 Unspecified macular degeneration: Secondary | ICD-10-CM | POA: Diagnosis not present

## 2017-04-27 ENCOUNTER — Encounter: Payer: Self-pay | Admitting: Internal Medicine

## 2017-04-27 ENCOUNTER — Other Ambulatory Visit: Payer: Self-pay

## 2017-04-27 ENCOUNTER — Encounter (INDEPENDENT_AMBULATORY_CARE_PROVIDER_SITE_OTHER): Payer: Self-pay

## 2017-04-27 ENCOUNTER — Ambulatory Visit (INDEPENDENT_AMBULATORY_CARE_PROVIDER_SITE_OTHER): Payer: Medicare Other | Admitting: Internal Medicine

## 2017-04-27 VITALS — BP 113/59 | HR 100 | Temp 98.6°F | Ht 64.0 in | Wt 142.7 lb

## 2017-04-27 DIAGNOSIS — K469 Unspecified abdominal hernia without obstruction or gangrene: Secondary | ICD-10-CM

## 2017-04-27 DIAGNOSIS — G8929 Other chronic pain: Secondary | ICD-10-CM | POA: Diagnosis not present

## 2017-04-27 DIAGNOSIS — Z7983 Long term (current) use of bisphosphonates: Secondary | ICD-10-CM

## 2017-04-27 DIAGNOSIS — Z79899 Other long term (current) drug therapy: Secondary | ICD-10-CM | POA: Diagnosis not present

## 2017-04-27 DIAGNOSIS — Z9889 Other specified postprocedural states: Secondary | ICD-10-CM | POA: Diagnosis not present

## 2017-04-27 DIAGNOSIS — Z87310 Personal history of (healed) osteoporosis fracture: Secondary | ICD-10-CM

## 2017-04-27 DIAGNOSIS — M8000XD Age-related osteoporosis with current pathological fracture, unspecified site, subsequent encounter for fracture with routine healing: Secondary | ICD-10-CM

## 2017-04-27 DIAGNOSIS — J449 Chronic obstructive pulmonary disease, unspecified: Secondary | ICD-10-CM | POA: Diagnosis not present

## 2017-04-27 DIAGNOSIS — M810 Age-related osteoporosis without current pathological fracture: Secondary | ICD-10-CM

## 2017-04-27 DIAGNOSIS — M549 Dorsalgia, unspecified: Secondary | ICD-10-CM | POA: Diagnosis not present

## 2017-04-27 DIAGNOSIS — Z79891 Long term (current) use of opiate analgesic: Secondary | ICD-10-CM

## 2017-04-27 DIAGNOSIS — Z978 Presence of other specified devices: Secondary | ICD-10-CM | POA: Diagnosis not present

## 2017-04-27 DIAGNOSIS — Z0000 Encounter for general adult medical examination without abnormal findings: Secondary | ICD-10-CM

## 2017-04-27 MED ORDER — HYDROCODONE-ACETAMINOPHEN 7.5-325 MG PO TABS: 1.0000 | ORAL_TABLET | Freq: Three times a day (TID) | ORAL | 0 refills | Status: DC | PRN

## 2017-04-27 MED ORDER — MORPHINE SULFATE ER 15 MG PO TBCR: 30.0000 mg | EXTENDED_RELEASE_TABLET | Freq: Two times a day (BID) | ORAL | 0 refills | Status: DC

## 2017-04-27 MED ORDER — VITAMIN D2 50 MCG (2000 UT) PO TABS: 2000.0000 [IU] | ORAL_TABLET | Freq: Every day | ORAL | 3 refills | Status: DC

## 2017-04-27 NOTE — Assessment & Plan Note (Addendum)
[name] G Buckneris on chronic opioid therapy for chronic pain. Date of pain contract was 03/17/16. As part of the treatment plan, the [place] controlled substance database is checked at least twice yearly and the database results areappropriate. database last reviewedon 02/02/2017.   The last UDS was on 10/02/2018and results were appropriate for her opioids and buproprion (result page indicates unexpected for buproprion though this was mistakenly omitted from supplemental lab paperwork).  The patient is onhydrocodone/acetaminophen (Lorcet, Lortab, Norco, Vicodin)strength 7.5-325 mg, 81per 30 days and MS Contin 15 mg 2 tablets Q12H, 120 per 30 days. Adjunctive treatment includes PT. This regimen allows [name] function and does not cause excessive sedation or other side effects.  The benefits of continuing opioid therapy outweigh the risks and chronic opioids will be continued. Ongoing education about safe opioid treatment is provided.  Interventions today include: 3 electronic refills for above doses and monthly amounts prescribed

## 2017-04-27 NOTE — Assessment & Plan Note (Signed)
Continue alendronate and vitamin D supplementation with 2000 units daily, medication list updated to reflect accurate dose.

## 2017-04-27 NOTE — Patient Instructions (Addendum)
Nice to see you again Ms. Korber.  We sent refills of your pain medications electronically to your pharmacy.  You can feel the starting on April 8 as that would be a month since her last.  For your vitamin D, you can now take 2000 units each day.  I sent this to the pharmacy but it may be cheaper for them to give you it over-the-counter

## 2017-04-27 NOTE — Assessment & Plan Note (Signed)
Patient rescheduled mammogram and reports we will perform later this month.  With her comorbidities and life expectancy, routine screening may not be as large of benefit is otherwise.  We discussed screening colonoscopy, patient feels this may have been done in recent past (within five years) but is unsure.

## 2017-04-27 NOTE — Assessment & Plan Note (Signed)
Prior COPD exacerbation has resolved, currently well controlled on albuterol as needed but if she were to redevelop wheezing or other respiratory symptoms would consider initiating maintenance therapy.  Continue current regimen for now

## 2017-04-27 NOTE — Progress Notes (Signed)
CC: Chronic pain follow-up  HPI:  [name] is a 72 y.o. female with a past medical history of chronic opiate use with chronic back pain, COPD, osteoporosis with pathologic spinal fracture, bowel surgeries with resulting ostomy, and otherwise described below who presents to the clinic for follow-up of her chronic opiate use.  Chronic opiate use: She is on both long-acting morphine and short-acting oxycodone for chronic back pain.  She reports her pain is still present but stable and is improved compared to her prior acute pain that was associated with pathologic fracture.  She denies any drowsiness or falls, no constipation and continues to have loose/watery ostomy output which is normal for her.  She was seen in [facility] in February and had home health orders renewed.  She continues to feel that she benefits from PT services.  Osteoporosis: She remains adherent with alendronate.  She had started vitamin D supplementation in the past, states she is still taking this.  Decreased to 2000 units daily at last visit, though medication list not updated to reflect, patient unsure of current dose.  COPD: She notes an issue getting albuterol filled after prior visits, though was able to obtain at an affordable price with help from pharmacy in clinic.  No current shortness of breath or respiratory complaints.  HM: Mammogram was ordered and scheduled, however the patient had to reschedule due to transportation issues.    Past Medical History:  Diagnosis Date  . Acute sinusitis 06/30/2011  . Anxiety   . Basal cell carcinoma    "left cheek"  . Bleeding ulcer   . Blind in both eyes   . Chronic back pain   . Degenerative disk disease    This is interscapular, mild compression frx of T12 superior endplate with Schmorl's node. Seen on CT in 2/08  . Depression   . Duodenal ulcer 11/08   With hemorrhage and obstruction  . History of blood transfusion    "related to bleeding from intestines and  vomiting blood"  . Ischemic colitis ([place]) 07/30/2011   2009 by endoscopy   . Macular degeneration, wet ([place])   . Osteomyelitis, jaw acute 11/08   Started while in the hospital abcess showed GNR . SP debridement by Dr. Lilli Few,  Priscella Mann S Bovis sp 4  weeks of Pen V started on 05/19/07  with additional 2 weeks in 07/04/07  . Perforated bowel [facility])    surgery july 2013  . Superior mesenteric artery syndrome ([place]) 11/08   sp dilation by Dr. Watt Climes, Pt may require bowel resetion if sx recur   Review of Systems:  Review of Systems  Respiratory: Negative for shortness of breath and wheezing.   Gastrointestinal: Negative for constipation.  Musculoskeletal: Negative for falls.  Neurological: Negative for dizziness.     Physical Exam:  Vitals:   04/27/17 1355  BP: (!) 113/59  Pulse: 100  Temp: 98.6 F (37 C)  TempSrc: Oral  SpO2: 95%  Weight: 142 lb 11.2 oz (64.7 kg)  Height: 5\' 4"  (1.626 m)   General: Frail elderly woman sitting in chair comfortably, no acute distress HEENT: Normocephalic atraumatic, normal conjunctiva, decreased vision CV: Regular rate and rhythm, no murmur appreciated Resp: Clear breath sounds bilaterally, no wheezing, normal work of breathing, no distress  Abd: Soft, +BS, ostomy with underlying hernia in place Neuro: Alert and oriented x3 Skin: Warm, dry      Assessment & Plan:   See Encounters Tab for problem based charting.  Patient discussed  with Dr. Angelia Mould

## 2017-04-28 DIAGNOSIS — M6281 Muscle weakness (generalized): Secondary | ICD-10-CM | POA: Diagnosis not present

## 2017-04-28 DIAGNOSIS — M13869 Other specified arthritis, unspecified knee: Secondary | ICD-10-CM | POA: Diagnosis not present

## 2017-04-28 DIAGNOSIS — J4530 Mild persistent asthma, uncomplicated: Secondary | ICD-10-CM | POA: Diagnosis not present

## 2017-04-28 DIAGNOSIS — M546 Pain in thoracic spine: Secondary | ICD-10-CM | POA: Diagnosis not present

## 2017-04-28 DIAGNOSIS — I10 Essential (primary) hypertension: Secondary | ICD-10-CM | POA: Diagnosis not present

## 2017-04-28 DIAGNOSIS — H3530 Unspecified macular degeneration: Secondary | ICD-10-CM | POA: Diagnosis not present

## 2017-04-28 DIAGNOSIS — K631 Perforation of intestine (nontraumatic): Secondary | ICD-10-CM | POA: Diagnosis not present

## 2017-04-28 DIAGNOSIS — K529 Noninfective gastroenteritis and colitis, unspecified: Secondary | ICD-10-CM | POA: Diagnosis not present

## 2017-04-28 DIAGNOSIS — R262 Difficulty in walking, not elsewhere classified: Secondary | ICD-10-CM | POA: Diagnosis not present

## 2017-04-28 DIAGNOSIS — K4031 Unilateral inguinal hernia, with obstruction, without gangrene, recurrent: Secondary | ICD-10-CM | POA: Diagnosis not present

## 2017-04-30 DIAGNOSIS — I10 Essential (primary) hypertension: Secondary | ICD-10-CM | POA: Diagnosis not present

## 2017-04-30 DIAGNOSIS — K529 Noninfective gastroenteritis and colitis, unspecified: Secondary | ICD-10-CM | POA: Diagnosis not present

## 2017-04-30 DIAGNOSIS — M6281 Muscle weakness (generalized): Secondary | ICD-10-CM | POA: Diagnosis not present

## 2017-04-30 DIAGNOSIS — R262 Difficulty in walking, not elsewhere classified: Secondary | ICD-10-CM | POA: Diagnosis not present

## 2017-04-30 DIAGNOSIS — K4031 Unilateral inguinal hernia, with obstruction, without gangrene, recurrent: Secondary | ICD-10-CM | POA: Diagnosis not present

## 2017-04-30 DIAGNOSIS — M13869 Other specified arthritis, unspecified knee: Secondary | ICD-10-CM | POA: Diagnosis not present

## 2017-04-30 DIAGNOSIS — J4530 Mild persistent asthma, uncomplicated: Secondary | ICD-10-CM | POA: Diagnosis not present

## 2017-04-30 DIAGNOSIS — M546 Pain in thoracic spine: Secondary | ICD-10-CM | POA: Diagnosis not present

## 2017-04-30 DIAGNOSIS — H3530 Unspecified macular degeneration: Secondary | ICD-10-CM | POA: Diagnosis not present

## 2017-04-30 DIAGNOSIS — K631 Perforation of intestine (nontraumatic): Secondary | ICD-10-CM | POA: Diagnosis not present

## 2017-04-30 NOTE — Progress Notes (Signed)
Internal Medicine Clinic Attending  Case discussed with Dr. Harden at the time of the visit.  We reviewed the resident's history and exam and pertinent patient test results.  I agree with the assessment, diagnosis, and plan of care documented in the resident's note.  

## 2017-05-05 DIAGNOSIS — M546 Pain in thoracic spine: Secondary | ICD-10-CM | POA: Diagnosis not present

## 2017-05-05 DIAGNOSIS — H3530 Unspecified macular degeneration: Secondary | ICD-10-CM | POA: Diagnosis not present

## 2017-05-05 DIAGNOSIS — R262 Difficulty in walking, not elsewhere classified: Secondary | ICD-10-CM | POA: Diagnosis not present

## 2017-05-05 DIAGNOSIS — I10 Essential (primary) hypertension: Secondary | ICD-10-CM | POA: Diagnosis not present

## 2017-05-05 DIAGNOSIS — K4031 Unilateral inguinal hernia, with obstruction, without gangrene, recurrent: Secondary | ICD-10-CM | POA: Diagnosis not present

## 2017-05-05 DIAGNOSIS — K631 Perforation of intestine (nontraumatic): Secondary | ICD-10-CM | POA: Diagnosis not present

## 2017-05-05 DIAGNOSIS — K529 Noninfective gastroenteritis and colitis, unspecified: Secondary | ICD-10-CM | POA: Diagnosis not present

## 2017-05-05 DIAGNOSIS — M6281 Muscle weakness (generalized): Secondary | ICD-10-CM | POA: Diagnosis not present

## 2017-05-05 DIAGNOSIS — J4530 Mild persistent asthma, uncomplicated: Secondary | ICD-10-CM | POA: Diagnosis not present

## 2017-05-05 DIAGNOSIS — M13869 Other specified arthritis, unspecified knee: Secondary | ICD-10-CM | POA: Diagnosis not present

## 2017-05-12 DIAGNOSIS — M13869 Other specified arthritis, unspecified knee: Secondary | ICD-10-CM | POA: Diagnosis not present

## 2017-05-12 DIAGNOSIS — K4031 Unilateral inguinal hernia, with obstruction, without gangrene, recurrent: Secondary | ICD-10-CM | POA: Diagnosis not present

## 2017-05-12 DIAGNOSIS — J4530 Mild persistent asthma, uncomplicated: Secondary | ICD-10-CM | POA: Diagnosis not present

## 2017-05-12 DIAGNOSIS — R262 Difficulty in walking, not elsewhere classified: Secondary | ICD-10-CM | POA: Diagnosis not present

## 2017-05-12 DIAGNOSIS — H3530 Unspecified macular degeneration: Secondary | ICD-10-CM | POA: Diagnosis not present

## 2017-05-12 DIAGNOSIS — K529 Noninfective gastroenteritis and colitis, unspecified: Secondary | ICD-10-CM | POA: Diagnosis not present

## 2017-05-12 DIAGNOSIS — K631 Perforation of intestine (nontraumatic): Secondary | ICD-10-CM | POA: Diagnosis not present

## 2017-05-12 DIAGNOSIS — I10 Essential (primary) hypertension: Secondary | ICD-10-CM | POA: Diagnosis not present

## 2017-05-12 DIAGNOSIS — M6281 Muscle weakness (generalized): Secondary | ICD-10-CM | POA: Diagnosis not present

## 2017-05-12 DIAGNOSIS — M546 Pain in thoracic spine: Secondary | ICD-10-CM | POA: Diagnosis not present

## 2017-05-14 DIAGNOSIS — I10 Essential (primary) hypertension: Secondary | ICD-10-CM | POA: Diagnosis not present

## 2017-05-14 DIAGNOSIS — H3530 Unspecified macular degeneration: Secondary | ICD-10-CM | POA: Diagnosis not present

## 2017-05-14 DIAGNOSIS — M13869 Other specified arthritis, unspecified knee: Secondary | ICD-10-CM | POA: Diagnosis not present

## 2017-05-14 DIAGNOSIS — J4530 Mild persistent asthma, uncomplicated: Secondary | ICD-10-CM | POA: Diagnosis not present

## 2017-05-14 DIAGNOSIS — K529 Noninfective gastroenteritis and colitis, unspecified: Secondary | ICD-10-CM | POA: Diagnosis not present

## 2017-05-14 DIAGNOSIS — M6281 Muscle weakness (generalized): Secondary | ICD-10-CM | POA: Diagnosis not present

## 2017-05-14 DIAGNOSIS — M546 Pain in thoracic spine: Secondary | ICD-10-CM | POA: Diagnosis not present

## 2017-05-14 DIAGNOSIS — K4031 Unilateral inguinal hernia, with obstruction, without gangrene, recurrent: Secondary | ICD-10-CM | POA: Diagnosis not present

## 2017-05-14 DIAGNOSIS — R262 Difficulty in walking, not elsewhere classified: Secondary | ICD-10-CM | POA: Diagnosis not present

## 2017-05-14 DIAGNOSIS — K631 Perforation of intestine (nontraumatic): Secondary | ICD-10-CM | POA: Diagnosis not present

## 2017-05-21 DIAGNOSIS — M546 Pain in thoracic spine: Secondary | ICD-10-CM | POA: Diagnosis not present

## 2017-05-21 DIAGNOSIS — M13869 Other specified arthritis, unspecified knee: Secondary | ICD-10-CM | POA: Diagnosis not present

## 2017-05-21 DIAGNOSIS — K631 Perforation of intestine (nontraumatic): Secondary | ICD-10-CM | POA: Diagnosis not present

## 2017-05-21 DIAGNOSIS — R262 Difficulty in walking, not elsewhere classified: Secondary | ICD-10-CM | POA: Diagnosis not present

## 2017-05-21 DIAGNOSIS — J4530 Mild persistent asthma, uncomplicated: Secondary | ICD-10-CM | POA: Diagnosis not present

## 2017-05-21 DIAGNOSIS — H3530 Unspecified macular degeneration: Secondary | ICD-10-CM | POA: Diagnosis not present

## 2017-05-21 DIAGNOSIS — K4031 Unilateral inguinal hernia, with obstruction, without gangrene, recurrent: Secondary | ICD-10-CM | POA: Diagnosis not present

## 2017-05-21 DIAGNOSIS — M6281 Muscle weakness (generalized): Secondary | ICD-10-CM | POA: Diagnosis not present

## 2017-05-21 DIAGNOSIS — I10 Essential (primary) hypertension: Secondary | ICD-10-CM | POA: Diagnosis not present

## 2017-05-21 DIAGNOSIS — K529 Noninfective gastroenteritis and colitis, unspecified: Secondary | ICD-10-CM | POA: Diagnosis not present

## 2017-05-26 DIAGNOSIS — K529 Noninfective gastroenteritis and colitis, unspecified: Secondary | ICD-10-CM | POA: Diagnosis not present

## 2017-05-26 DIAGNOSIS — J4530 Mild persistent asthma, uncomplicated: Secondary | ICD-10-CM | POA: Diagnosis not present

## 2017-05-26 DIAGNOSIS — M13869 Other specified arthritis, unspecified knee: Secondary | ICD-10-CM | POA: Diagnosis not present

## 2017-05-26 DIAGNOSIS — I10 Essential (primary) hypertension: Secondary | ICD-10-CM | POA: Diagnosis not present

## 2017-05-26 DIAGNOSIS — R262 Difficulty in walking, not elsewhere classified: Secondary | ICD-10-CM | POA: Diagnosis not present

## 2017-05-26 DIAGNOSIS — M546 Pain in thoracic spine: Secondary | ICD-10-CM | POA: Diagnosis not present

## 2017-05-26 DIAGNOSIS — M6281 Muscle weakness (generalized): Secondary | ICD-10-CM | POA: Diagnosis not present

## 2017-05-26 DIAGNOSIS — K631 Perforation of intestine (nontraumatic): Secondary | ICD-10-CM | POA: Diagnosis not present

## 2017-05-26 DIAGNOSIS — K4031 Unilateral inguinal hernia, with obstruction, without gangrene, recurrent: Secondary | ICD-10-CM | POA: Diagnosis not present

## 2017-05-26 DIAGNOSIS — H3530 Unspecified macular degeneration: Secondary | ICD-10-CM | POA: Diagnosis not present

## 2017-05-28 DIAGNOSIS — H3530 Unspecified macular degeneration: Secondary | ICD-10-CM | POA: Diagnosis not present

## 2017-05-28 DIAGNOSIS — K4031 Unilateral inguinal hernia, with obstruction, without gangrene, recurrent: Secondary | ICD-10-CM | POA: Diagnosis not present

## 2017-05-28 DIAGNOSIS — I10 Essential (primary) hypertension: Secondary | ICD-10-CM | POA: Diagnosis not present

## 2017-05-28 DIAGNOSIS — K631 Perforation of intestine (nontraumatic): Secondary | ICD-10-CM | POA: Diagnosis not present

## 2017-05-28 DIAGNOSIS — M13869 Other specified arthritis, unspecified knee: Secondary | ICD-10-CM | POA: Diagnosis not present

## 2017-05-28 DIAGNOSIS — K529 Noninfective gastroenteritis and colitis, unspecified: Secondary | ICD-10-CM | POA: Diagnosis not present

## 2017-05-28 DIAGNOSIS — M546 Pain in thoracic spine: Secondary | ICD-10-CM | POA: Diagnosis not present

## 2017-05-28 DIAGNOSIS — J4530 Mild persistent asthma, uncomplicated: Secondary | ICD-10-CM | POA: Diagnosis not present

## 2017-05-28 DIAGNOSIS — R262 Difficulty in walking, not elsewhere classified: Secondary | ICD-10-CM | POA: Diagnosis not present

## 2017-05-28 DIAGNOSIS — M6281 Muscle weakness (generalized): Secondary | ICD-10-CM | POA: Diagnosis not present

## 2017-06-02 ENCOUNTER — Other Ambulatory Visit: Payer: Self-pay | Admitting: *Deleted

## 2017-06-02 DIAGNOSIS — K631 Perforation of intestine (nontraumatic): Secondary | ICD-10-CM | POA: Diagnosis not present

## 2017-06-02 DIAGNOSIS — K529 Noninfective gastroenteritis and colitis, unspecified: Secondary | ICD-10-CM | POA: Diagnosis not present

## 2017-06-02 DIAGNOSIS — K4031 Unilateral inguinal hernia, with obstruction, without gangrene, recurrent: Secondary | ICD-10-CM | POA: Diagnosis not present

## 2017-06-02 DIAGNOSIS — K219 Gastro-esophageal reflux disease without esophagitis: Secondary | ICD-10-CM

## 2017-06-02 DIAGNOSIS — M546 Pain in thoracic spine: Secondary | ICD-10-CM | POA: Diagnosis not present

## 2017-06-02 DIAGNOSIS — I10 Essential (primary) hypertension: Secondary | ICD-10-CM | POA: Diagnosis not present

## 2017-06-02 DIAGNOSIS — R262 Difficulty in walking, not elsewhere classified: Secondary | ICD-10-CM | POA: Diagnosis not present

## 2017-06-02 DIAGNOSIS — R000 Tachycardia, unspecified: Secondary | ICD-10-CM

## 2017-06-02 DIAGNOSIS — J4530 Mild persistent asthma, uncomplicated: Secondary | ICD-10-CM | POA: Diagnosis not present

## 2017-06-02 DIAGNOSIS — H3530 Unspecified macular degeneration: Secondary | ICD-10-CM | POA: Diagnosis not present

## 2017-06-02 DIAGNOSIS — M6281 Muscle weakness (generalized): Secondary | ICD-10-CM | POA: Diagnosis not present

## 2017-06-02 DIAGNOSIS — M13869 Other specified arthritis, unspecified knee: Secondary | ICD-10-CM | POA: Diagnosis not present

## 2017-06-03 ENCOUNTER — Emergency Department (HOSPITAL_COMMUNITY): Payer: Medicare Other

## 2017-06-03 ENCOUNTER — Other Ambulatory Visit: Payer: Self-pay

## 2017-06-03 ENCOUNTER — Encounter (HOSPITAL_COMMUNITY): Payer: Self-pay

## 2017-06-03 ENCOUNTER — Inpatient Hospital Stay (HOSPITAL_COMMUNITY)
Admission: EM | Admit: 2017-06-03 | Discharge: 2017-06-08 | DRG: 392 | Disposition: A | Discharge status: Home Health | Payer: Medicare Other | Attending: Internal Medicine | Admitting: Internal Medicine

## 2017-06-03 ENCOUNTER — Observation Stay (HOSPITAL_COMMUNITY): Payer: Medicare Other

## 2017-06-03 DIAGNOSIS — Z833 Family history of diabetes mellitus: Secondary | ICD-10-CM

## 2017-06-03 DIAGNOSIS — M810 Age-related osteoporosis without current pathological fracture: Secondary | ICD-10-CM | POA: Diagnosis not present

## 2017-06-03 DIAGNOSIS — Z90710 Acquired absence of both cervix and uterus: Secondary | ICD-10-CM

## 2017-06-03 DIAGNOSIS — Z79891 Long term (current) use of opiate analgesic: Secondary | ICD-10-CM

## 2017-06-03 DIAGNOSIS — Z8719 Personal history of other diseases of the digestive system: Secondary | ICD-10-CM

## 2017-06-03 DIAGNOSIS — J449 Chronic obstructive pulmonary disease, unspecified: Secondary | ICD-10-CM | POA: Diagnosis not present

## 2017-06-03 DIAGNOSIS — K6389 Other specified diseases of intestine: Secondary | ICD-10-CM | POA: Diagnosis present

## 2017-06-03 DIAGNOSIS — K5289 Other specified noninfective gastroenteritis and colitis: Principal | ICD-10-CM | POA: Diagnosis present

## 2017-06-03 DIAGNOSIS — R933 Abnormal findings on diagnostic imaging of other parts of digestive tract: Secondary | ICD-10-CM | POA: Diagnosis not present

## 2017-06-03 DIAGNOSIS — J90 Pleural effusion, not elsewhere classified: Secondary | ICD-10-CM | POA: Diagnosis not present

## 2017-06-03 DIAGNOSIS — F329 Major depressive disorder, single episode, unspecified: Secondary | ICD-10-CM | POA: Diagnosis present

## 2017-06-03 DIAGNOSIS — Z9049 Acquired absence of other specified parts of digestive tract: Secondary | ICD-10-CM

## 2017-06-03 DIAGNOSIS — D649 Anemia, unspecified: Secondary | ICD-10-CM

## 2017-06-03 DIAGNOSIS — R110 Nausea: Secondary | ICD-10-CM | POA: Diagnosis not present

## 2017-06-03 DIAGNOSIS — K469 Unspecified abdominal hernia without obstruction or gangrene: Secondary | ICD-10-CM | POA: Diagnosis not present

## 2017-06-03 DIAGNOSIS — R112 Nausea with vomiting, unspecified: Secondary | ICD-10-CM | POA: Diagnosis not present

## 2017-06-03 DIAGNOSIS — Z933 Colostomy status: Secondary | ICD-10-CM

## 2017-06-03 DIAGNOSIS — R197 Diarrhea, unspecified: Secondary | ICD-10-CM | POA: Diagnosis not present

## 2017-06-03 DIAGNOSIS — E876 Hypokalemia: Secondary | ICD-10-CM | POA: Diagnosis not present

## 2017-06-03 DIAGNOSIS — K529 Noninfective gastroenteritis and colitis, unspecified: Secondary | ICD-10-CM | POA: Diagnosis not present

## 2017-06-03 DIAGNOSIS — Z7983 Long term (current) use of bisphosphonates: Secondary | ICD-10-CM

## 2017-06-03 DIAGNOSIS — R109 Unspecified abdominal pain: Secondary | ICD-10-CM | POA: Diagnosis not present

## 2017-06-03 DIAGNOSIS — G8929 Other chronic pain: Secondary | ICD-10-CM | POA: Diagnosis not present

## 2017-06-03 DIAGNOSIS — H548 Legal blindness, as defined in USA: Secondary | ICD-10-CM | POA: Diagnosis not present

## 2017-06-03 DIAGNOSIS — R1032 Left lower quadrant pain: Secondary | ICD-10-CM | POA: Diagnosis not present

## 2017-06-03 DIAGNOSIS — K5909 Other constipation: Secondary | ICD-10-CM | POA: Diagnosis present

## 2017-06-03 DIAGNOSIS — I700 Atherosclerosis of aorta: Secondary | ICD-10-CM

## 2017-06-03 DIAGNOSIS — I959 Hypotension, unspecified: Secondary | ICD-10-CM | POA: Diagnosis not present

## 2017-06-03 DIAGNOSIS — K311 Adult hypertrophic pyloric stenosis: Secondary | ICD-10-CM | POA: Diagnosis present

## 2017-06-03 DIAGNOSIS — K222 Esophageal obstruction: Secondary | ICD-10-CM | POA: Diagnosis present

## 2017-06-03 DIAGNOSIS — Z87891 Personal history of nicotine dependence: Secondary | ICD-10-CM | POA: Diagnosis not present

## 2017-06-03 DIAGNOSIS — K56699 Other intestinal obstruction unspecified as to partial versus complete obstruction: Secondary | ICD-10-CM | POA: Diagnosis not present

## 2017-06-03 DIAGNOSIS — J441 Chronic obstructive pulmonary disease with (acute) exacerbation: Secondary | ICD-10-CM

## 2017-06-03 DIAGNOSIS — D509 Iron deficiency anemia, unspecified: Secondary | ICD-10-CM | POA: Diagnosis not present

## 2017-06-03 DIAGNOSIS — K219 Gastro-esophageal reflux disease without esophagitis: Secondary | ICD-10-CM | POA: Diagnosis not present

## 2017-06-03 DIAGNOSIS — R0781 Pleurodynia: Secondary | ICD-10-CM

## 2017-06-03 DIAGNOSIS — E785 Hyperlipidemia, unspecified: Secondary | ICD-10-CM | POA: Diagnosis present

## 2017-06-03 DIAGNOSIS — K315 Obstruction of duodenum: Secondary | ICD-10-CM | POA: Diagnosis not present

## 2017-06-03 DIAGNOSIS — Z8249 Family history of ischemic heart disease and other diseases of the circulatory system: Secondary | ICD-10-CM | POA: Diagnosis not present

## 2017-06-03 DIAGNOSIS — M545 Low back pain: Secondary | ICD-10-CM | POA: Diagnosis not present

## 2017-06-03 DIAGNOSIS — Z85828 Personal history of other malignant neoplasm of skin: Secondary | ICD-10-CM | POA: Diagnosis not present

## 2017-06-03 DIAGNOSIS — R1110 Vomiting, unspecified: Secondary | ICD-10-CM | POA: Diagnosis not present

## 2017-06-03 DIAGNOSIS — R1310 Dysphagia, unspecified: Secondary | ICD-10-CM | POA: Diagnosis not present

## 2017-06-03 DIAGNOSIS — D122 Benign neoplasm of ascending colon: Secondary | ICD-10-CM | POA: Diagnosis present

## 2017-06-03 LAB — COMPREHENSIVE METABOLIC PANEL
ALBUMIN: 2.6 g/dL — AB (ref 3.5–5.0)
ALK PHOS: 170 U/L — AB (ref 38–126)
ALT: 16 U/L (ref 14–54)
AST: 38 U/L (ref 15–41)
Anion gap: 12 (ref 5–15)
BUN: 5 mg/dL — ABNORMAL LOW (ref 6–20)
CALCIUM: 8.9 mg/dL (ref 8.9–10.3)
CHLORIDE: 106 mmol/L (ref 101–111)
CO2: 27 mmol/L (ref 22–32)
CREATININE: 0.51 mg/dL (ref 0.44–1.00)
GFR calc Af Amer: >60 mL/min (ref >60)
GFR calc non Af Amer: >60 mL/min (ref >60)
GLUCOSE: 124 mg/dL — AB (ref 65–99)
Potassium: 3 mmol/L — ABNORMAL LOW (ref 3.5–5.1)
SODIUM: 145 mmol/L (ref 135–145)
Total Bilirubin: 0.3 mg/dL (ref 0.3–1.2)
Total Protein: 6.2 g/dL — ABNORMAL LOW (ref 6.5–8.1)

## 2017-06-03 LAB — URINALYSIS, ROUTINE W REFLEX MICROSCOPIC
BILIRUBIN URINE: NEGATIVE
GLUCOSE, UA: NEGATIVE mg/dL
Hgb urine dipstick: NEGATIVE
KETONES UR: NEGATIVE mg/dL
Leukocytes, UA: NEGATIVE
Nitrite: NEGATIVE
PH: 5 (ref 5.0–8.0)
Protein, ur: NEGATIVE mg/dL
Specific Gravity, Urine: 1.021 (ref 1.005–1.030)

## 2017-06-03 LAB — CBC WITH DIFFERENTIAL/PLATELET
BASOS PCT: 1 %
Basophils Absolute: 0 10*3/uL (ref 0.0–0.1)
EOS ABS: 0 10*3/uL (ref 0.0–0.7)
Eosinophils Relative: 0 %
HCT: 37 % (ref 36.0–46.0)
Hemoglobin: 11.2 g/dL — ABNORMAL LOW (ref 12.0–15.0)
LYMPHS ABS: 1 10*3/uL (ref 0.7–4.0)
Lymphocytes Relative: 16 %
MCH: 23.2 pg — AB (ref 26.0–34.0)
MCHC: 30.3 g/dL (ref 30.0–36.0)
MCV: 76.6 fL — ABNORMAL LOW (ref 78.0–100.0)
MONOS PCT: 5 %
Monocytes Absolute: 0.3 10*3/uL (ref 0.1–1.0)
NEUTROS ABS: 5.2 10*3/uL (ref 1.7–7.7)
NEUTROS PCT: 78 %
PLATELETS: 303 10*3/uL (ref 150–400)
RBC: 4.83 MIL/uL (ref 3.87–5.11)
RDW: 20.8 % — AB (ref 11.5–15.5)
WBC: 6.5 10*3/uL (ref 4.0–10.5)

## 2017-06-03 LAB — LIPASE, BLOOD: LIPASE: 29 U/L (ref 11–51)

## 2017-06-03 LAB — MAGNESIUM: Magnesium: 1.7 mg/dL (ref 1.7–2.4)

## 2017-06-03 LAB — I-STAT CG4 LACTIC ACID, ED: Lactic Acid, Venous: 1.4 mmol/L (ref 0.5–1.9)

## 2017-06-03 MED ORDER — POTASSIUM CHLORIDE 10 MEQ/100ML IV SOLN
10.0000 meq | Freq: Once | INTRAVENOUS | Status: AC
  Administered 2017-06-03: 10 meq via INTRAVENOUS
  Filled 2017-06-03: qty 100

## 2017-06-03 MED ORDER — POTASSIUM CHLORIDE IN NACL 40-0.9 MEQ/L-% IV SOLN
INTRAVENOUS | Status: AC
  Administered 2017-06-04: 125 mL/h via INTRAVENOUS
  Administered 2017-06-04: 100 mL/h via INTRAVENOUS
  Filled 2017-06-03 (×3): qty 1000

## 2017-06-03 MED ORDER — SODIUM CHLORIDE 0.9 % IV BOLUS
500.0000 mL | Freq: Once | INTRAVENOUS | Status: AC
  Administered 2017-06-03: 500 mL via INTRAVENOUS

## 2017-06-03 MED ORDER — ONDANSETRON HCL 4 MG PO TABS
4.0000 mg | ORAL_TABLET | Freq: Four times a day (QID) | ORAL | Status: DC | PRN
Start: 2017-06-03 — End: 2017-06-08
  Administered 2017-06-08: 4 mg via ORAL
  Filled 2017-06-03: qty 1

## 2017-06-03 MED ORDER — POLYETHYLENE GLYCOL 3350 17 G PO PACK
17.0000 g | PACK | Freq: Every day | ORAL | Status: DC | PRN
Start: 2017-06-03 — End: 2017-06-03

## 2017-06-03 MED ORDER — MORPHINE SULFATE (PF) 2 MG/ML IV SOLN
2.0000 mg | Freq: Once | INTRAVENOUS | Status: AC
  Administered 2017-06-03: 2 mg via INTRAVENOUS
  Filled 2017-06-03: qty 1

## 2017-06-03 MED ORDER — PRAVASTATIN SODIUM 40 MG PO TABS
40.0000 mg | ORAL_TABLET | Freq: Every day | ORAL | Status: DC
  Administered 2017-06-04 – 2017-06-08 (×5): 40 mg via ORAL
  Filled 2017-06-03 (×5): qty 1

## 2017-06-03 MED ORDER — DOCUSATE SODIUM 100 MG PO CAPS: 200.0000 mg | ORAL_CAPSULE | Freq: Every day | ORAL | Status: DC

## 2017-06-03 MED ORDER — MORPHINE SULFATE ER 15 MG PO TBCR: 30.0000 mg | EXTENDED_RELEASE_TABLET | Freq: Two times a day (BID) | ORAL | 0 refills | Status: DC

## 2017-06-03 MED ORDER — METOPROLOL TARTRATE 25 MG PO TABS: 12.5000 mg | ORAL_TABLET | Freq: Two times a day (BID) | ORAL | 1 refills | Status: DC

## 2017-06-03 MED ORDER — METRONIDAZOLE IN NACL 5-0.79 MG/ML-% IV SOLN
500.0000 mg | Freq: Once | INTRAVENOUS | Status: AC
  Administered 2017-06-03: 500 mg via INTRAVENOUS
  Filled 2017-06-03: qty 100

## 2017-06-03 MED ORDER — ESOMEPRAZOLE MAGNESIUM 40 MG PO CPDR: 40.0000 mg | DELAYED_RELEASE_CAPSULE | Freq: Two times a day (BID) | ORAL | 1 refills | Status: DC

## 2017-06-03 MED ORDER — MORPHINE SULFATE ER 15 MG PO TBCR
30.0000 mg | EXTENDED_RELEASE_TABLET | Freq: Two times a day (BID) | ORAL | Status: DC
  Administered 2017-06-03 – 2017-06-08 (×10): 30 mg via ORAL
  Filled 2017-06-03 (×10): qty 2

## 2017-06-03 MED ORDER — PANTOPRAZOLE SODIUM 40 MG PO TBEC
40.0000 mg | DELAYED_RELEASE_TABLET | Freq: Every day | ORAL | Status: DC
  Administered 2017-06-04 – 2017-06-08 (×5): 40 mg via ORAL
  Filled 2017-06-03 (×5): qty 1

## 2017-06-03 MED ORDER — HYDROCODONE-ACETAMINOPHEN 7.5-325 MG PO TABS
1.0000 | ORAL_TABLET | Freq: Three times a day (TID) | ORAL | Status: DC | PRN
  Administered 2017-06-04: 1 via ORAL
  Filled 2017-06-03: qty 1

## 2017-06-03 MED ORDER — MAGNESIUM SULFATE 2 GM/50ML IV SOLN
2.0000 g | Freq: Once | INTRAVENOUS | Status: AC
  Administered 2017-06-04: 2 g via INTRAVENOUS
  Filled 2017-06-03: qty 50

## 2017-06-03 MED ORDER — HYDROCODONE-ACETAMINOPHEN 7.5-325 MG PO TABS: 1.0000 | ORAL_TABLET | Freq: Three times a day (TID) | ORAL | 0 refills | Status: DC | PRN

## 2017-06-03 MED ORDER — ALBUTEROL SULFATE (2.5 MG/3ML) 0.083% IN NEBU
2.5000 mg | INHALATION_SOLUTION | Freq: Four times a day (QID) | RESPIRATORY_TRACT | Status: DC | PRN
  Administered 2017-06-04 – 2017-06-05 (×2): 2.5 mg via RESPIRATORY_TRACT
  Filled 2017-06-03 (×2): qty 3

## 2017-06-03 MED ORDER — ONDANSETRON HCL 4 MG/2ML IJ SOLN
4.0000 mg | Freq: Four times a day (QID) | INTRAMUSCULAR | Status: DC | PRN
  Administered 2017-06-03 – 2017-06-08 (×5): 4 mg via INTRAVENOUS
  Filled 2017-06-03 (×5): qty 2

## 2017-06-03 MED ORDER — IOPAMIDOL (ISOVUE-300) INJECTION 61%
INTRAVENOUS | Status: AC
  Filled 2017-06-03: qty 100

## 2017-06-03 MED ORDER — POTASSIUM CHLORIDE 20 MEQ/15ML (10%) PO SOLN
40.0000 meq | Freq: Once | ORAL | Status: AC
  Administered 2017-06-03: 40 meq via ORAL
  Filled 2017-06-03: qty 30

## 2017-06-03 MED ORDER — ONDANSETRON HCL 4 MG/2ML IJ SOLN
4.0000 mg | Freq: Once | INTRAMUSCULAR | Status: AC
  Administered 2017-06-03: 4 mg via INTRAVENOUS
  Filled 2017-06-03: qty 2

## 2017-06-03 MED ORDER — IOPAMIDOL (ISOVUE-300) INJECTION 61%
100.0000 mL | Freq: Once | INTRAVENOUS | Status: AC | PRN
  Administered 2017-06-03: 100 mL via INTRAVENOUS

## 2017-06-03 MED ORDER — ENOXAPARIN SODIUM 40 MG/0.4ML ~~LOC~~ SOLN
40.0000 mg | SUBCUTANEOUS | Status: DC
Start: 2017-06-04 — End: 2017-06-03

## 2017-06-03 MED ORDER — CIPROFLOXACIN IN D5W 400 MG/200ML IV SOLN
400.0000 mg | Freq: Once | INTRAVENOUS | Status: AC
  Administered 2017-06-03: 400 mg via INTRAVENOUS
  Filled 2017-06-03 (×2): qty 200

## 2017-06-03 MED ORDER — VITAMIN D2 50 MCG (2000 UT) PO TABS: 2000.0000 [IU] | ORAL_TABLET | Freq: Every day | ORAL | 1 refills | Status: DC

## 2017-06-03 MED ORDER — VITAMIN D 1000 UNITS PO TABS
2000.0000 [IU] | ORAL_TABLET | Freq: Every day | ORAL | Status: DC
  Administered 2017-06-04 – 2017-06-08 (×5): 2000 [IU] via ORAL
  Filled 2017-06-03 (×5): qty 2

## 2017-06-03 NOTE — ED Notes (Signed)
Pt, states she's still unable to use the bathroom in order to give [place] an sample.

## 2017-06-03 NOTE — ED Notes (Signed)
Pt states she's unable to give a urine sample at the moment maybe within in hour.

## 2017-06-03 NOTE — ED Notes (Signed)
[facility] called and on the way 
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
## 2017-06-03 NOTE — H&P (Addendum)
Date: 06/03/2017               Patient Name:  [name] Bell MRN: [id number]  DOB: [birth year] Age / Sex: 72 y.o., female   PCP: Tawny Asal, MD         Medical Service: Internal Medicine Teaching Service         Attending Physician: Dr. Oval Linsey, MD    First Contact: Dr. Tarri Abernethy Pager: [phone]  Second Contact: Dr. Philipp Ovens Pager: [phone]       After Hours (After 5p/  First Contact Pager: [phone]  weekends / holidays): Second Contact Pager: [phone]   Chief Complaint: Nausea, vomiting, diarrhea, abdominal pain  History of Present Illness: [name] Bell is a 72 yo F with Hx of Ischemic colitis,  Stercoral ulcer (w/ perforation, s/p sigmoid colectomy w/ colostomy 2013), Chronic Low Back Pain (on chronic opioids), COPD, GERD, Osteoporosis, Hyperlipidemia, Legal Blindness, and Depression who present with nausea, vomiting, diarrhea, and abdominal pain.  Patient states that her symptoms started about 1 week ago with significantly increased ostomy output which has slowly improved since that time but remains increased from baseline. She also noted a sensation that she was briefly have trouble passing a stool, but that sensation passed as well. She began to experience and increase in her chronic LLQ pain starting about 4 days ago that was described as crampy with some burning sensation rated at 10/10 and aggravated by movement. Two days ago she noticed she began having a mucus discharge from her rectum with associated pain at her rectum. The morning of admission she developed nausea and vomiting for which she tried her home phenergan without relief, she then decided it was time to go to the ED for further assistance. She states that her symptoms feel very similar to an admission in March 2018 for Colitis. She notes that she has been experiencing pain in the area of her later left ribs since and episode of heavy vomiting earlier today. She changed her diet for the past 4-5 days and has been  eating mostly beef and chicken broth, which she had been able to tolerate, along with medications, up until this morning; and she continues to have a good appetite. She endorses chills, worsening of her baseline DOE. She denies fevers, headaches, bloody or dark stools (states she cannot see this but her home health aid did not report any significant color changes).  In the ED, vital signs significant for Soft BP 100s-110s/40s-50s. CMP showed K 3.0, Alb 2.6, and ALP 170; CBC showed Hgb 11.2 (baseline ~14), MCV 76.6; Lipase, Lactate, and UA WNL. EKG shows Sinus rhythm with PACs. 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 Patient received 500cc NS bolus, 52mEq IV KCl, 2mg  Morphine, Zofran, and 500mg  IV Metronidazole. Patient admitted following transfer from [facility] for further work up and care.  Meds:  Current Meds  Medication Sig  . albuterol (PROVENTIL HFA;VENTOLIN HFA) 108 (90 Base) MCG/ACT inhaler Inhale 2 puffs into the lungs every 6 (six) hours as needed for wheezing or shortness of breath.  Marland Kitchen albuterol (PROVENTIL) (2.5 MG/3ML) 0.083% nebulizer solution Take 3 mLs (2.5 mg total) by nebulization every 6 (six) hours as needed for wheezing or shortness of breath.  Marland Kitchen alendronate (FOSAMAX) 70 MG tablet Take 1 tablet (70 mg total) by mouth once a week. Take with a full glass of water  on an empty stomach.  . dextromethorphan-guaiFENesin (MUCINEX DM) 30-600 MG 12hr tablet Take 1 tablet by mouth 2 (two) times daily.  . diphenhydrAMINE (BENADRYL) 25 MG tablet Take 25 mg by mouth every 6 (six) hours as needed for allergies.  Marland Kitchen docusate sodium (COLACE) 100 MG capsule Take 2 capsules (200 mg total) by mouth daily.  . Ergocalciferol (VITAMIN D2) 2000 units TABS Take 2,000 Units by mouth daily.  Marland Kitchen esomeprazole (NEXIUM) 40 MG capsule Take 1 capsule (40 mg total) by  mouth 2 (two) times daily before a meal.  . fluticasone (FLONASE) 50 MCG/ACT nasal spray Place 2 sprays into both nostrils daily.  Marland Kitchen HYDROcodone-acetaminophen (NORCO) 7.5-325 MG tablet Take 1 tablet by mouth every 8 (eight) hours as needed for moderate pain.  . metoprolol tartrate (LOPRESSOR) 25 MG tablet Take 0.5 tablets (12.5 mg total) by mouth 2 (two) times daily.  Marland Kitchen morphine ([name] CONTIN) 15 MG 12 hr tablet Take 2 tablets (30 mg total) by mouth every 12 (twelve) hours.  . polyethylene glycol (MIRALAX / GLYCOLAX) packet Take 17 g by mouth daily as needed. (Patient taking differently: Take 17 g by mouth daily as needed for moderate constipation. )  . pravastatin (PRAVACHOL) 40 MG tablet Take 1 tablet (40 mg total) by mouth daily.     Allergies: Allergies as of 06/03/2017 - Review Complete 06/03/2017  Allergen Reaction Noted  . Ibuprofen Other (See Comments)   . Nsaids Other (See Comments)   . Latex Itching 07/29/2011   Past Medical History:  Diagnosis Date  . Acute sinusitis 06/30/2011  . Anxiety   . Basal cell carcinoma    "left cheek"  . Bleeding ulcer   . Blind in both eyes   . Chronic back pain   . Degenerative disk disease    This is interscapular, mild compression frx of T12 superior endplate with Schmorl's node. Seen on CT in 2/08  . Depression   . Duodenal ulcer 11/08   With hemorrhage and obstruction  . History of blood transfusion    "related to bleeding from intestines and vomiting blood"  . Ischemic colitis ([place]) 07/30/2011   2009 by endoscopy   . Macular degeneration, wet ([place])   . Osteomyelitis, jaw acute 11/08   Started while in the hospital abcess showed GNR . SP debridement by Dr. Lilli Few,  Priscella Mann S Bovis sp 4  weeks of Pen V started on 05/19/07  with additional 2 weeks in 07/04/07  . Perforated bowel [facility])    surgery july 2013  . Superior mesenteric artery syndrome ([facility]) 11/08   sp dilation by Dr. Watt Climes, Pt may require bowel resetion if sx recur   Family History:   Family History  Problem Relation Age of Onset  . Cancer Mother        Lung  . Heart disease Father   . Diabetes Father   . Diabetes Sister   - Reviewed on admission  Social History: Social History   Tobacco Use  . Smoking status: Former Smoker    Packs/day: 1.50    Years: 59.00    Pack years: 88.50    Types: Cigarettes    Last attempt to quit: 05/30/2014    Years since quitting: 3.0  . Smokeless tobacco: Never Used  Substance Use Topics  . Alcohol use: No    Alcohol/week: 0.0 oz  . Drug use: No  - Reviewed on admission  Review of Systems: A complete ROS was negative except as per HPI.  Physical  Exam: Blood pressure (!) 121/46, pulse 80, temperature 99.3 F (37.4 C), temperature source Oral, resp. rate 20, height 5\' 4"  (1.626 m), weight 138 lb 0.1 oz (62.6 kg), last menstrual period 08/25/1980, SpO2 (!) 9 %. Physical Exam  Constitutional: She is oriented to person, place, and time. She appears well-developed and well-nourished. No distress.  HENT:  Head: Normocephalic and atraumatic.  Mildly dry oral mucosa  Eyes: EOM are normal. Right eye exhibits no discharge. Left eye exhibits no discharge.  Cardiovascular: Normal rate, regular rhythm, normal heart sounds and intact distal pulses.  Pulmonary/Chest: Effort normal and breath sounds normal. No respiratory distress.  Abdominal: Soft. Bowel sounds are normal. She exhibits no distension.  Diffusely tender to palpation with pain localizing to periumbilical region on palpation of RUQ, RLQ, and LUQ. Tender to palpation at LLQ. Colostomy in place LLQ with minimal light brown stool in bag.  Musculoskeletal: She exhibits no tenderness or deformity.  Trace pitting edema bilateral LEs   Neurological: She is alert and oriented to person, place, and time.  Skin: Skin is warm and dry.   EKG: personally reviewed my interpretation is normal sinus rhythm with PAC.  CT Abdomen/Pelvis W: IMPRESSION: - Diffuse wall thickening of the  colon and rectal stump consistent with colitis; differential diagnosis would include infection, inflammatory bowel disease, and ischemia. - Parastomal herniation of transverse colon without evidence of bowel obstruction. - Fatty infiltration of liver. - Small LEFT pleural effusion. - Extensive atherosclerotic calcifications without aneurysm. - Cholelithiasis.  Assessment & Plan by Problem: 72 yo F with Hx of Ischemic colitis,  Stercoral ulcer (w/ perforation, s/p sigmoid colectomy w/ colostomy 2013), Chronic Low Back Pain (on chronic opioids), COPD, GERD, Osteoporosis, Hyperlipidemia, Legal Blindness, and Depression who present with nausea, vomiting, diarrhea, and abdominal pain.  Colitis Hx of Ischemic colitis Hx of Stercoral Ulcer (w/ perforation, s/p sigmoid colectomy with colostomy): Patient presented with multiple days of increased colostomy output, increasing abdominal pain, and mucus per rectum and 1 day of nausea and vomiting. She has history of colitis in the setting of Ischemic colitis and colostomy (since 2013). Patient reported diverticulitis, however, upon chart review, no diverticulosis was noted at the time of colostomy placement and ulceration was noted to be Stercoral type; No subsequent CTs have noted diverticulosis and patient does not have colonoscopy in records. Patient has history of hospitalization for infection colitis in 2014, Bowl obstruction 2/2 parastomal herniation in 2015 and 2017, and Colitis again in 2018. She states her symptoms seem similar to those she experienced in 2018. CT in ED noted colitis of colon and rectal stump. Patient received Metronidazole in the ED. > Of note, patient has history of GE junction and Duodenal stenosis, last dilated 04/06/16. - Discontinue antibiotics as patient has remained afebrile without leukocytosis lowering likelyhood on infectious colitis and if ischemic in origin antibiotics indicated for moderate-severe cases or presense of  ulcers. - Clear liquid diet - Pain control with home Morphine and Norco as below - NS w/ K @ 100cc/hr x 20hrs - PRN Zofran - AM CBC and CMP ADDENDUM: Labs returned showing Hgb now 9.3, increasing concern for severity of potential ischemic colitis - Start IV Metronidazole 500mg  q8h and IV Ceftriaxone 2g Daily - NPO for bowl rest - LDH  Hypokalemia: K 3.0 on admission in the setting of increased ostomy output. Received 10 mEq IV KCl in ED. - Check Mg - 40 mEq PO KCl - 40 mEq / L in IVF @ 100cc/hr x20hrs -  AM BMP  Microcytic Anemia: New on admission labs. Hgb 11.2 (baseline ~14). Will obtain ferritin to eval for Fe deficiency vs Chronic Disease/Inflammation. - Ferritin ADDENDUM: Labs returned showing Hgb now 9.3 - Heme occult stool  Chronic Low Back Pain (on chronic opioids) - Continue home morphine 30mg  q12h - Continue home Norco 7.5-325 q8h PRN - Holding bowl regimen in the setting of increased ostomy out put, resume as needed  Rib pain Osteoporosis: On Alendronate (on fridays) and Vit D supplementation for osteoporosis. Reports rib pain with point tenderness on exam. - Holding Alendronate given patients GI symptoms - Continue home Vit D - Left rib xray to evaluate for fracture - Pain control as above  GERD: On esomeprazole 40mg  BID at home - Protonix 40mg  Daily  Hyperlipidemia - Continue home pravastatin  COPD - Allbuterol neb q6h PRN  Depression: Previously on Bupropion, but has stopped taking htis  FEN: Clears, NS w/ K @ 100cc/hr x 20hrs VTE ppx: SCDs Code Status: Full   Dispo: Admit patient to Observation with expected length of stay less than 2 midnights.  Signed: Neva Seat, MD 06/03/2017, 11:31 PM  Pager: [phone]

## 2017-06-03 NOTE — ED Notes (Signed)
ED TO INPATIENT HANDOFF REPORT  Name/Age/Gender [name] Bell 72 y.o. female  Code Status Code Status History    Date Active Date Inactive Code Status Order ID Comments User Context   03/31/2016 2112 04/07/2016 1704 Full Code [id number]  Lily Kocher, MD ED   09/02/2015 1721 09/04/2015 2316 Full Code [id number]  Norman Herrlich, MD ED   10/11/2013 1918 10/13/2013 1927 Full Code [id number]  Cresenciano Genre Inpatient   09/29/2012 0528 10/02/2012 1828 Full Code [id number]  Clinton Gallant, MD Inpatient   07/30/2011 0425 08/11/2011 1529 Full Code [id number]  Johney Maine, Revonda Standard., MD ED    Advance Directive Documentation     Most Recent Value  Type of Advance Directive  Living will  Pre-existing out of facility DNR order (yellow form or pink MOST form)  -  "MOST" Form in Place?  -      Home/SNF/Other Home  Chief Complaint nvd  Level of Care/Admitting Diagnosis ED Disposition    ED Disposition Condition [place]: [place] [[id number]]  Level of Care: Med-Surg [16]  Diagnosis: Colitis [109323]  Admitting Physician: Oval Linsey [[id number]]  Attending Physician: Oval Linsey [[id number]]  PT Class (Do Not Modify): Observation [104]  PT Acc Code (Do Not Modify): Observation [10022]       Medical History Past Medical History:  Diagnosis Date  . Acute sinusitis 06/30/2011  . Anxiety   . Basal cell carcinoma    "left cheek"  . Bleeding ulcer   . Blind in both eyes   . Chronic back pain   . Degenerative disk disease    This is interscapular, mild compression frx of T12 superior endplate with Schmorl's node. Seen on CT in 2/08  . Depression   . Duodenal ulcer 11/08   With hemorrhage and obstruction  . History of blood transfusion    "related to bleeding from intestines and vomiting blood"  . Ischemic colitis ([place]) 07/30/2011   2009 by endoscopy   . Macular degeneration, wet ([place])   . Osteomyelitis, jaw acute 11/08   Started while in the hospital abcess showed GNR .  SP debridement by Dr. Lilli Few,  Priscella Mann S Bovis sp 4  weeks of Pen V started on 05/19/07  with additional 2 weeks in 07/04/07  . Perforated bowel [facility])    surgery july 2013  . Superior mesenteric artery syndrome ([place]) 11/08   sp dilation by Dr. Watt Climes, Pt may require bowel resetion if sx recur    Allergies Allergies  Allergen Reactions  . Ibuprofen Other (See Comments)    REACTION: Bleeding ulcers  . Nsaids Other (See Comments)    REACTION: Bleeding Ulcer  . Latex Itching    IV Location/Drains/Wounds Patient Lines/Drains/Airways Status   Active Line/Drains/Airways    Name:   Placement date:   Placement time:   Site:   Days:   Peripheral IV 06/03/17 Right;Upper Arm   06/03/17    1647    Arm   less than 1   Colostomy LLQ   07/30/11    0740    LLQ   2135          Labs/Imaging Results for orders placed or performed during the hospital encounter of 06/03/17 (from the past 48 hour(s))  Comprehensive metabolic panel     Status: Abnormal   Collection Time: 06/03/17  4:45 PM  Result Value Ref Range   Sodium 145 135 - 145 mmol/L   Potassium 3.0 (L) 3.5 -  5.1 mmol/L   Chloride 106 101 - 111 mmol/L   CO2 27 22 - 32 mmol/L   Glucose, Bld 124 (H) 65 - 99 mg/dL   BUN 5 (L) 6 - 20 mg/dL   Creatinine, Ser 0.51 0.44 - 1.00 mg/dL   Calcium 8.9 8.9 - 10.3 mg/dL   Total Protein 6.2 (L) 6.5 - 8.1 g/dL   Albumin 2.6 (L) 3.5 - 5.0 g/dL   AST 38 15 - 41 U/L   ALT 16 14 - 54 U/L   Alkaline Phosphatase 170 (H) 38 - 126 U/L   Total Bilirubin 0.3 0.3 - 1.2 mg/dL   GFR calc non Af Amer >60 >60 mL/min   GFR calc Af Amer >60 >60 mL/min    Comment: (NOTE) The eGFR has been calculated using the CKD EPI equation. This calculation has not been validated in all clinical situations. eGFR's persistently <60 mL/min signify possible Chronic Kidney Disease.    Anion gap 12 5 - 15    Comment: Performed at [facility], [place] [address] [place], [place] [address]  CBC with Differential      Status: Abnormal   Collection Time: 06/03/17  4:45 PM  Result Value Ref Range   WBC 6.5 4.0 - 10.5 K/uL   RBC 4.83 3.87 - 5.11 MIL/uL   Hemoglobin 11.2 (L) 12.0 - 15.0 g/dL   HCT 37.0 36.0 - 46.0 %   MCV 76.6 (L) 78.0 - 100.0 fL   MCH 23.2 (L) 26.0 - 34.0 pg   MCHC 30.3 30.0 - 36.0 g/dL   RDW 20.8 (H) 11.5 - 15.5 %   Platelets 303 150 - 400 K/uL   Neutrophils Relative % 78 %   Neutro Abs 5.2 1.7 - 7.7 K/uL   Lymphocytes Relative 16 %   Lymphs Abs 1.0 0.7 - 4.0 K/uL   Monocytes Relative 5 %   Monocytes Absolute 0.3 0.1 - 1.0 K/uL   Eosinophils Relative 0 %   Eosinophils Absolute 0.0 0.0 - 0.7 K/uL   Basophils Relative 1 %   Basophils Absolute 0.0 0.0 - 0.1 K/uL    Comment: Performed at [facility], [place] [address], [place], [place] [address]  Lipase, blood     Status: None   Collection Time: 06/03/17  4:45 PM  Result Value Ref Range   Lipase 29 11 - 51 U/L    Comment: Performed at [facility], [place] [address], [place], [place] [address]  I-Stat CG4 Lactic Acid, ED     Status: None   Collection Time: 06/03/17  4:53 PM  Result Value Ref Range   Lactic Acid, Venous 1.40 0.5 - 1.9 mmol/L  Urinalysis, Routine w reflex microscopic     Status: None   Collection Time: 06/03/17  6:37 PM  Result Value Ref Range   Color, Urine YELLOW YELLOW   APPearance CLEAR CLEAR   Specific Gravity, Urine 1.021 1.005 - 1.030   pH 5.0 5.0 - 8.0   Glucose, UA NEGATIVE NEGATIVE mg/dL   Hgb urine dipstick NEGATIVE NEGATIVE   Bilirubin Urine NEGATIVE NEGATIVE   Ketones, ur NEGATIVE NEGATIVE mg/dL   Protein, ur NEGATIVE NEGATIVE mg/dL   Nitrite NEGATIVE NEGATIVE   Leukocytes, UA NEGATIVE NEGATIVE    Comment: Performed at [place] [address] [place], [place] [address]   Ct Abdomen Pelvis W Contrast  Result Date: 06/03/2017 CLINICAL DATA:  Acute generalized abdominal pain, nausea, vomiting and diarrhea for 5  hours, irritation at  colostomy, history of bowel perforation with surgery, ulcer disease, COPD, former smoker EXAM: CT ABDOMEN AND PELVIS WITH CONTRAST TECHNIQUE: Multidetector CT imaging of the abdomen and pelvis was performed using the standard protocol following bolus administration of intravenous contrast. Sagittal and coronal MPR images reconstructed from axial data set. CONTRAST:  149m ISOVUE-300 IOPAMIDOL (ISOVUE-300) INJECTION 61% IV. No oral contrast. COMPARISON:  10/26/2016 FINDINGS: Lower chest: Peribronchial thickening with bibasilar atelectasis and small LEFT pleural effusion. Hepatobiliary: Fatty infiltration of liver. Dependent gallstones in gallbladder. No biliary dilatation or hepatic mass lesion Pancreas: Atrophic pancreas Spleen: Unremarkable Adrenals/Urinary Tract: Adrenal glands, kidneys, ureters, and bladder normal appearance Stomach/Bowel: Appendix not visualized, surgical status uncertain. Collapsed stomach, wall thickness suboptimally assessed. LEFT lower quadrant sigmoid colostomy. Parastomal herniation of transverse colon without bowel obstruction, though diffuse wall thickening of the transverse through sigmoid colon is present question colitis. Suspect mild wall thickening of the distal ascending colon as well. Small bowel loops appear unremarkable. Wall thickening of the rectal stump noted with infiltrative changes/edema of perirectal fat extending into presacral space. Vascular/Lymphatic: Atherosclerotic calcifications aorta and iliac arteries without aneurysm. Scattered normal size mesenteric lymph nodes. Reproductive: Uterus surgically absent. Nonvisualization of ovaries. Other: No free air or free fluid.  No abscess collection. Musculoskeletal: Diffuse osseous demineralization. IMPRESSION: Diffuse wall thickening of the colon and rectal stump consistent with colitis; differential diagnosis would include infection, inflammatory bowel disease, and ischemia. Parastomal herniation of transverse colon  without evidence of bowel obstruction. Fatty infiltration of liver. Small LEFT pleural effusion. Extensive atherosclerotic calcifications without aneurysm. Cholelithiasis. Aortic Atherosclerosis (ICD10-I70.0). Electronically Signed   By: MLavonia DanaM.D.   On: 06/03/2017 18:23    Pending Labs Unresulted Labs (From admission, onward)   None      Vitals/Pain Today's Vitals   06/03/17 1547 06/03/17 1730 06/03/17 1900 06/03/17 1945  BP:  (!) 112/52 (!) 121/54 (!) 116/46  Pulse:  84 95 89  Resp:  17 (!) 22 19  Temp:      TempSrc:      SpO2:  100% 95% 95%  Weight: 135 lb (61.2 kg)     Height: '5\' 4"'$  (1.626 m)     PainSc:        Isolation Precautions No active isolations  Medications Medications  iopamidol (ISOVUE-300) 61 % injection (has no administration in time range)  metroNIDAZOLE (FLAGYL) IVPB 500 mg (500 mg Intravenous New Bag/Given 06/03/17 2041)  ciprofloxacin (CIPRO) IVPB 400 mg (has no administration in time range)  ondansetron (ZOFRAN) injection 4 mg (4 mg Intravenous Given 06/03/17 1654)  sodium chloride 0.9 % bolus 500 mL (0 mLs Intravenous Stopped 06/03/17 1747)  iopamidol (ISOVUE-300) 61 % injection 100 mL (100 mLs Intravenous Contrast Given 06/03/17 1757)  potassium chloride 10 mEq in 100 mL IVPB (0 mEq Intravenous Stopped 06/03/17 2035)  morphine 2 MG/ML injection 2 mg (2 mg Intravenous Given 06/03/17 2037)    Mobility walks with person assist

## 2017-06-03 NOTE — Telephone Encounter (Signed)
Pt calling to F/u on all of her Medications due to her pharmacy being closed. PT would also like to know what to do about her Pain Medications since her pharmacy has closed.  Pt would like a call back.

## 2017-06-03 NOTE — ED Triage Notes (Signed)
EMS reports from home N/V/D x 5 hours, taking Rx Promethazine without relief, Pt states she vomits frequently. Colostomy irritation.  BP 124/78 HR 82 Resp 16 Sp02 95 RA

## 2017-06-04 ENCOUNTER — Other Ambulatory Visit: Payer: Self-pay

## 2017-06-04 DIAGNOSIS — D509 Iron deficiency anemia, unspecified: Secondary | ICD-10-CM | POA: Diagnosis not present

## 2017-06-04 DIAGNOSIS — K315 Obstruction of duodenum: Secondary | ICD-10-CM

## 2017-06-04 DIAGNOSIS — J90 Pleural effusion, not elsewhere classified: Secondary | ICD-10-CM | POA: Diagnosis not present

## 2017-06-04 DIAGNOSIS — Z933 Colostomy status: Secondary | ICD-10-CM

## 2017-06-04 DIAGNOSIS — H548 Legal blindness, as defined in USA: Secondary | ICD-10-CM

## 2017-06-04 DIAGNOSIS — Z7983 Long term (current) use of bisphosphonates: Secondary | ICD-10-CM

## 2017-06-04 DIAGNOSIS — I700 Atherosclerosis of aorta: Secondary | ICD-10-CM

## 2017-06-04 DIAGNOSIS — R0781 Pleurodynia: Secondary | ICD-10-CM | POA: Diagnosis not present

## 2017-06-04 DIAGNOSIS — Z886 Allergy status to analgesic agent status: Secondary | ICD-10-CM

## 2017-06-04 DIAGNOSIS — E876 Hypokalemia: Secondary | ICD-10-CM | POA: Diagnosis not present

## 2017-06-04 DIAGNOSIS — Z9049 Acquired absence of other specified parts of digestive tract: Secondary | ICD-10-CM

## 2017-06-04 DIAGNOSIS — Z91040 Latex allergy status: Secondary | ICD-10-CM

## 2017-06-04 DIAGNOSIS — Z8719 Personal history of other diseases of the digestive system: Secondary | ICD-10-CM

## 2017-06-04 DIAGNOSIS — K219 Gastro-esophageal reflux disease without esophagitis: Secondary | ICD-10-CM

## 2017-06-04 DIAGNOSIS — K529 Noninfective gastroenteritis and colitis, unspecified: Secondary | ICD-10-CM

## 2017-06-04 DIAGNOSIS — J449 Chronic obstructive pulmonary disease, unspecified: Secondary | ICD-10-CM | POA: Diagnosis not present

## 2017-06-04 DIAGNOSIS — M545 Low back pain: Secondary | ICD-10-CM | POA: Diagnosis not present

## 2017-06-04 DIAGNOSIS — D649 Anemia, unspecified: Secondary | ICD-10-CM

## 2017-06-04 DIAGNOSIS — F3340 Major depressive disorder, recurrent, in remission, unspecified: Secondary | ICD-10-CM

## 2017-06-04 DIAGNOSIS — E785 Hyperlipidemia, unspecified: Secondary | ICD-10-CM | POA: Diagnosis not present

## 2017-06-04 DIAGNOSIS — Z87891 Personal history of nicotine dependence: Secondary | ICD-10-CM

## 2017-06-04 DIAGNOSIS — Z79891 Long term (current) use of opiate analgesic: Secondary | ICD-10-CM

## 2017-06-04 DIAGNOSIS — G8929 Other chronic pain: Secondary | ICD-10-CM

## 2017-06-04 DIAGNOSIS — M810 Age-related osteoporosis without current pathological fracture: Secondary | ICD-10-CM | POA: Diagnosis not present

## 2017-06-04 DIAGNOSIS — Z79899 Other long term (current) drug therapy: Secondary | ICD-10-CM

## 2017-06-04 LAB — CBC
HCT: 31.5 % — ABNORMAL LOW (ref 36.0–46.0)
HCT: 28.9 % — ABNORMAL LOW (ref 36.0–46.0)
HEMOGLOBIN: 9.3 g/dL — AB (ref 12.0–15.0)
Hemoglobin: 8.7 g/dL — ABNORMAL LOW (ref 12.0–15.0)
MCH: 23.3 pg — ABNORMAL LOW (ref 26.0–34.0)
MCH: 22.9 pg — AB (ref 26.0–34.0)
MCHC: 29.5 g/dL — ABNORMAL LOW (ref 30.0–36.0)
MCHC: 30.1 g/dL (ref 30.0–36.0)
MCV: 77.4 fL — ABNORMAL LOW (ref 78.0–100.0)
MCV: 77.3 fL — ABNORMAL LOW (ref 78.0–100.0)
Platelets: 274 10*3/uL (ref 150–400)
Platelets: 266 10*3/uL (ref 150–400)
RBC: 3.74 MIL/uL — ABNORMAL LOW (ref 3.87–5.11)
RBC: 4.07 MIL/uL (ref 3.87–5.11)
RDW: 20.8 % — ABNORMAL HIGH (ref 11.5–15.5)
RDW: 21.1 % — AB (ref 11.5–15.5)
WBC: 6.4 10*3/uL (ref 4.0–10.5)
WBC: 7.6 10*3/uL (ref 4.0–10.5)

## 2017-06-04 LAB — COMPREHENSIVE METABOLIC PANEL
ALK PHOS: 129 U/L — AB (ref 38–126)
ALT: 12 U/L — ABNORMAL LOW (ref 14–54)
ANION GAP: 7 (ref 5–15)
AST: 32 U/L (ref 15–41)
Albumin: 1.9 g/dL — ABNORMAL LOW (ref 3.5–5.0)
BUN: <5 mg/dL — ABNORMAL LOW (ref 6–20)
CALCIUM: 8 mg/dL — AB (ref 8.9–10.3)
CO2: 27 mmol/L (ref 22–32)
Chloride: 110 mmol/L (ref 101–111)
Creatinine, Ser: 0.62 mg/dL (ref 0.44–1.00)
GFR calc non Af Amer: >60 mL/min (ref >60)
Glucose, Bld: 91 mg/dL (ref 65–99)
Potassium: 3.8 mmol/L (ref 3.5–5.1)
SODIUM: 144 mmol/L (ref 135–145)
TOTAL PROTEIN: 4.2 g/dL — AB (ref 6.5–8.1)
Total Bilirubin: 0.3 mg/dL (ref 0.3–1.2)

## 2017-06-04 LAB — FERRITIN: FERRITIN: 6 ng/mL — AB (ref 11–307)

## 2017-06-04 LAB — MAGNESIUM: MAGNESIUM: 1.7 mg/dL (ref 1.7–2.4)

## 2017-06-04 LAB — LACTATE DEHYDROGENASE: LDH: 170 U/L (ref 98–192)

## 2017-06-04 LAB — OCCULT BLOOD X 1 CARD TO LAB, STOOL
FECAL OCCULT BLD: NEGATIVE
FECAL OCCULT BLD: NEGATIVE

## 2017-06-04 LAB — MRSA PCR SCREENING: MRSA by PCR: NEGATIVE

## 2017-06-04 MED ORDER — SODIUM CHLORIDE 0.9 % IV SOLN
2.0000 g | Freq: Every day | INTRAVENOUS | Status: DC
  Administered 2017-06-04 – 2017-06-06 (×4): 2 g via INTRAVENOUS
  Filled 2017-06-04 (×4): qty 20

## 2017-06-04 MED ORDER — ALBUTEROL SULFATE (2.5 MG/3ML) 0.083% IN NEBU
2.5000 mg | INHALATION_SOLUTION | Freq: Three times a day (TID) | RESPIRATORY_TRACT | Status: DC
  Administered 2017-06-04: 2.5 mg via RESPIRATORY_TRACT
  Filled 2017-06-04: qty 3

## 2017-06-04 MED ORDER — HYDROCODONE-ACETAMINOPHEN 7.5-325 MG PO TABS
1.0000 | ORAL_TABLET | Freq: Four times a day (QID) | ORAL | Status: DC | PRN
  Administered 2017-06-04 – 2017-06-08 (×13): 1 via ORAL
  Filled 2017-06-04 (×14): qty 1

## 2017-06-04 MED ORDER — METRONIDAZOLE IN NACL 5-0.79 MG/ML-% IV SOLN
500.0000 mg | Freq: Three times a day (TID) | INTRAVENOUS | Status: DC
  Administered 2017-06-04 – 2017-06-07 (×10): 500 mg via INTRAVENOUS
  Filled 2017-06-04 (×10): qty 100

## 2017-06-04 NOTE — Care Management Note (Signed)
Case Management Note  Patient Details  Name: [name] Bell MRN: [id number] Date of Birth: 1945-03-07  Subjective/Objective:                 Patient active with [facility] PT. No DME needed. Has PCS through Medicaid benefits by [facility]. She is in the process of working with [employer] to increase her hours of PCS. This is manged by [employer] and her PCP. She gets her colostomy supplies through [employer]. She states she currently has enough. Her PCS HHA keeps counts and assists her with reording, patient states she is dedicated and wonderful. No further CM needs at this time.    Action/Plan:  Spoke w Dawn at [facility] to notify of admission. [phone]. Needs resumption orders for [facility] if changed over to inpatient, currently obs. Would benefit added RN.   Expected Discharge Date:  (unknown)               Expected Discharge Plan:  [place]  In-House Referral:     Discharge planning Services  CM Consult  Post Acute Care Choice:  Resumption of Svcs/PTA Provider Choice offered to:  Patient  DME Arranged:    DME Agency:     HH Arranged:    [place] Agency:  [facility]  Status of Service:  In process, will continue to follow  If discussed at Long Length of Stay Meetings, dates discussed:    Additional Comments:  Carles Collet, RN 06/04/2017, 11:54 AM

## 2017-06-04 NOTE — Progress Notes (Signed)
   Subjective: Patient doing well this AM. Continues to describe crampy lower abdominal pain. She states that she has similar episodes at least once a year. Has had biopsies in the past and told it was due to inflammation. Denies recent travel. Endorses sick contact but no with GI issues. Endorses possibly eating raw meat 1-2 weeks prior to admission. All questions and concerns addressed.   Objective: Vital signs in last 24 hours: Vitals:   06/03/17 2030 06/03/17 2115 06/03/17 2153 06/04/17 0541  BP: (!) 105/49 (!) 105/47 (!) 121/46 (!) 115/52  Pulse: 90 87 80 81  Resp: 15 (!) 21 20 20   Temp:   99.3 F (37.4 C) (!) 97.3 F (36.3 C)  TempSrc:   Oral Oral  SpO2: 91% 97% 90% 100%  Weight:   138 lb 0.1 oz (62.6 kg)   Height:   5\' 4"  (1.626 m)    General: Well nourished female in no acute distress Pulm: Good air movement with diffuse wheezing CV: RRR, no murmurs, no rubs  Abdomen: Active bowel sounds, soft, no rebound or guarding, tenderness to palpation of the lower abdomen   Extremities: Pulses palpable in all extremities, no LE edema  Skin: Warm and dry  Neuro: Alert and oriented x 3  Assessment/Plan:  Diffuse Colitis. Patient is a 72 y.o female with a diverting colostomy secondary to Stercoral type ulcer perforation in 2013. Since that time she has had recurrent episodes of colitis. Review of medical records indicates that she has had ischemic colitis (2009), infectious colitis, and inflammatory (transmural inflammation 2013). CT abdomen this admission illustrated thickening of the transverse colon and hartman's pouch consistent with colitis. Labs were significant for a new iron deficiency anemia.  - Differential includes infectious vs inflammatory (micorscopic colitis, ulcerative, Crohn's) vs ischemic vs diversion colitis  - We have consulted GI for evaluation for colonoscopy and biopsies  - Checking stool studies (micro) - IVF - Continuing Ceftriaxone and Metronidazole  - Pain  management with morphine   Iron Deficiency Anemia  - New problem  - Evaluation as per above   Chronic Low Back Pain (on chronic opioids) - Continue home morphine 30mg  q12h - Continue home Norco 7.5-325 q8h PRN - Holding bowl regimen in the setting of increased ostomy out put, resume as needed  Rib pain Osteoporosis: On Alendronate (on fridays) and Vit D supplementation for osteoporosis. Reports rib pain with point tenderness on exam. - Holding Alendronate given patients GI symptoms - Continue home Vit D - Left rib xray to evaluate for fracture - Pain control as above  GERD: On esomeprazole 40mg  BID at home - Protonix 40mg  Daily  Hyperlipidemia - Continue home pravastatin  COPD - Allbuterol neb q6h PRN  Dispo: Anticipated discharge in approximately >1 day(s).   [name] Homes, MD 06/04/2017, 9:59 AM My Pager: [phone]
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
## 2017-06-04 NOTE — Progress Notes (Signed)
Internal Medicine Attending  Date: 06/04/2017  Patient name: [name] Bell Medical record number: [id number] Date of birth: [birth year] Age: 73 y.o. Gender: female  I saw and evaluated the patient. I reviewed the resident's note by Dr. Tarri Abernethy and I agree with the resident's findings and plans as documented in his progress note.  Please see my H&P dated 06/04/2017 and attached to Dr. Tally Joe H&P dated 06/03/2017 for the specifics of my evaluation, assessment, and plan from earlier in the day.

## 2017-06-04 NOTE — Progress Notes (Signed)
Patient received from [facility] long hospital. Patient is alert and oriented x4. Vital signs are stable. Iv in place. Given medicine for pain. Skin assessment done with another nurse. Patient given instruction about call bell, phone. Bed in low position and side rail up x3. Call bell in reach.

## 2017-06-05 DIAGNOSIS — E876 Hypokalemia: Secondary | ICD-10-CM | POA: Diagnosis present

## 2017-06-05 DIAGNOSIS — R109 Unspecified abdominal pain: Secondary | ICD-10-CM | POA: Diagnosis not present

## 2017-06-05 DIAGNOSIS — G8929 Other chronic pain: Secondary | ICD-10-CM | POA: Diagnosis not present

## 2017-06-05 DIAGNOSIS — E785 Hyperlipidemia, unspecified: Secondary | ICD-10-CM | POA: Diagnosis not present

## 2017-06-05 DIAGNOSIS — K222 Esophageal obstruction: Secondary | ICD-10-CM | POA: Diagnosis not present

## 2017-06-05 DIAGNOSIS — K5909 Other constipation: Secondary | ICD-10-CM | POA: Diagnosis present

## 2017-06-05 DIAGNOSIS — R1084 Generalized abdominal pain: Secondary | ICD-10-CM | POA: Diagnosis not present

## 2017-06-05 DIAGNOSIS — Z9049 Acquired absence of other specified parts of digestive tract: Secondary | ICD-10-CM | POA: Diagnosis not present

## 2017-06-05 DIAGNOSIS — Z933 Colostomy status: Secondary | ICD-10-CM | POA: Diagnosis not present

## 2017-06-05 DIAGNOSIS — M545 Low back pain: Secondary | ICD-10-CM | POA: Diagnosis not present

## 2017-06-05 DIAGNOSIS — Z85828 Personal history of other malignant neoplasm of skin: Secondary | ICD-10-CM | POA: Diagnosis not present

## 2017-06-05 DIAGNOSIS — Z7983 Long term (current) use of bisphosphonates: Secondary | ICD-10-CM | POA: Diagnosis not present

## 2017-06-05 DIAGNOSIS — K469 Unspecified abdominal hernia without obstruction or gangrene: Secondary | ICD-10-CM | POA: Diagnosis not present

## 2017-06-05 DIAGNOSIS — R1310 Dysphagia, unspecified: Secondary | ICD-10-CM | POA: Diagnosis not present

## 2017-06-05 DIAGNOSIS — J90 Pleural effusion, not elsewhere classified: Secondary | ICD-10-CM | POA: Diagnosis not present

## 2017-06-05 DIAGNOSIS — Z90710 Acquired absence of both cervix and uterus: Secondary | ICD-10-CM | POA: Diagnosis not present

## 2017-06-05 DIAGNOSIS — R112 Nausea with vomiting, unspecified: Secondary | ICD-10-CM | POA: Diagnosis not present

## 2017-06-05 DIAGNOSIS — K5289 Other specified noninfective gastroenteritis and colitis: Secondary | ICD-10-CM | POA: Diagnosis not present

## 2017-06-05 DIAGNOSIS — R1013 Epigastric pain: Secondary | ICD-10-CM | POA: Diagnosis not present

## 2017-06-05 DIAGNOSIS — K529 Noninfective gastroenteritis and colitis, unspecified: Secondary | ICD-10-CM | POA: Diagnosis not present

## 2017-06-05 DIAGNOSIS — Z833 Family history of diabetes mellitus: Secondary | ICD-10-CM | POA: Diagnosis not present

## 2017-06-05 DIAGNOSIS — Z743 Need for continuous supervision: Secondary | ICD-10-CM | POA: Diagnosis not present

## 2017-06-05 DIAGNOSIS — F329 Major depressive disorder, single episode, unspecified: Secondary | ICD-10-CM | POA: Diagnosis present

## 2017-06-05 DIAGNOSIS — K311 Adult hypertrophic pyloric stenosis: Secondary | ICD-10-CM | POA: Diagnosis not present

## 2017-06-05 DIAGNOSIS — H548 Legal blindness, as defined in USA: Secondary | ICD-10-CM | POA: Diagnosis not present

## 2017-06-05 DIAGNOSIS — Z87891 Personal history of nicotine dependence: Secondary | ICD-10-CM | POA: Diagnosis not present

## 2017-06-05 DIAGNOSIS — Z79891 Long term (current) use of opiate analgesic: Secondary | ICD-10-CM | POA: Diagnosis not present

## 2017-06-05 DIAGNOSIS — K56699 Other intestinal obstruction unspecified as to partial versus complete obstruction: Secondary | ICD-10-CM | POA: Diagnosis not present

## 2017-06-05 DIAGNOSIS — Z8249 Family history of ischemic heart disease and other diseases of the circulatory system: Secondary | ICD-10-CM | POA: Diagnosis not present

## 2017-06-05 DIAGNOSIS — R197 Diarrhea, unspecified: Secondary | ICD-10-CM | POA: Diagnosis not present

## 2017-06-05 DIAGNOSIS — Z8719 Personal history of other diseases of the digestive system: Secondary | ICD-10-CM | POA: Diagnosis not present

## 2017-06-05 DIAGNOSIS — M810 Age-related osteoporosis without current pathological fracture: Secondary | ICD-10-CM | POA: Diagnosis present

## 2017-06-05 DIAGNOSIS — R279 Unspecified lack of coordination: Secondary | ICD-10-CM | POA: Diagnosis not present

## 2017-06-05 DIAGNOSIS — D509 Iron deficiency anemia, unspecified: Secondary | ICD-10-CM | POA: Diagnosis not present

## 2017-06-05 DIAGNOSIS — K6389 Other specified diseases of intestine: Secondary | ICD-10-CM | POA: Diagnosis not present

## 2017-06-05 DIAGNOSIS — I959 Hypotension, unspecified: Secondary | ICD-10-CM | POA: Diagnosis not present

## 2017-06-05 DIAGNOSIS — R933 Abnormal findings on diagnostic imaging of other parts of digestive tract: Secondary | ICD-10-CM | POA: Diagnosis not present

## 2017-06-05 DIAGNOSIS — K219 Gastro-esophageal reflux disease without esophagitis: Secondary | ICD-10-CM | POA: Diagnosis present

## 2017-06-05 DIAGNOSIS — R0781 Pleurodynia: Secondary | ICD-10-CM | POA: Diagnosis not present

## 2017-06-05 DIAGNOSIS — J449 Chronic obstructive pulmonary disease, unspecified: Secondary | ICD-10-CM | POA: Diagnosis present

## 2017-06-05 LAB — CBC
HEMATOCRIT: 30.5 % — AB (ref 36.0–46.0)
HEMOGLOBIN: 8.9 g/dL — AB (ref 12.0–15.0)
MCH: 23.1 pg — AB (ref 26.0–34.0)
MCHC: 29.2 g/dL — AB (ref 30.0–36.0)
MCV: 79.2 fL (ref 78.0–100.0)
Platelets: 259 10*3/uL (ref 150–400)
RBC: 3.85 MIL/uL — AB (ref 3.87–5.11)
RDW: 21.5 % — ABNORMAL HIGH (ref 11.5–15.5)
WBC: 5.9 10*3/uL (ref 4.0–10.5)

## 2017-06-05 LAB — GASTROINTESTINAL PANEL BY PCR, STOOL (REPLACES STOOL CULTURE)
Adenovirus F40/41: NOT DETECTED
Astrovirus: NOT DETECTED
CAMPYLOBACTER SPECIES: NOT DETECTED
CYCLOSPORA CAYETANENSIS: NOT DETECTED
Cryptosporidium: NOT DETECTED
Entamoeba histolytica: NOT DETECTED
Enteroaggregative E coli (EAEC): NOT DETECTED
Enteropathogenic E coli (EPEC): NOT DETECTED
Enterotoxigenic E coli (ETEC): NOT DETECTED
Giardia lamblia: NOT DETECTED
Norovirus GI/GII: NOT DETECTED
PLESIMONAS SHIGELLOIDES: NOT DETECTED
ROTAVIRUS A: NOT DETECTED
SAPOVIRUS (I, II, IV, AND V): NOT DETECTED
SHIGA LIKE TOXIN PRODUCING E COLI (STEC): NOT DETECTED
SHIGELLA/ENTEROINVASIVE E COLI (EIEC): NOT DETECTED
Salmonella species: NOT DETECTED
VIBRIO SPECIES: NOT DETECTED
Vibrio cholerae: NOT DETECTED
YERSINIA ENTEROCOLITICA: NOT DETECTED

## 2017-06-05 LAB — BASIC METABOLIC PANEL
ANION GAP: 5 (ref 5–15)
BUN: <5 mg/dL — ABNORMAL LOW (ref 6–20)
CALCIUM: 8 mg/dL — AB (ref 8.9–10.3)
CO2: 26 mmol/L (ref 22–32)
Chloride: 113 mmol/L — ABNORMAL HIGH (ref 101–111)
Creatinine, Ser: 0.57 mg/dL (ref 0.44–1.00)
GFR calc non Af Amer: >60 mL/min (ref >60)
GLUCOSE: 80 mg/dL (ref 65–99)
POTASSIUM: 4.3 mmol/L (ref 3.5–5.1)
Sodium: 144 mmol/L (ref 135–145)

## 2017-06-05 MED ORDER — ALBUTEROL SULFATE (2.5 MG/3ML) 0.083% IN NEBU
2.5000 mg | INHALATION_SOLUTION | Freq: Four times a day (QID) | RESPIRATORY_TRACT | Status: DC
  Administered 2017-06-05 – 2017-06-06 (×3): 2.5 mg via RESPIRATORY_TRACT
  Filled 2017-06-05 (×3): qty 3

## 2017-06-05 MED ORDER — ALBUTEROL SULFATE (2.5 MG/3ML) 0.083% IN NEBU: 2.5000 mg | INHALATION_SOLUTION | Freq: Four times a day (QID) | RESPIRATORY_TRACT | Status: DC

## 2017-06-05 NOTE — Discharge Summary (Signed)
Name: [name] Bell MRN: [id number] DOB: 09-19-1945 72 y.o. PCP: Tawny Asal, MD  Date of Admission: 06/03/2017  3:33 PM Date of Discharge: 5/14/20195/14/2019 Attending Physician: No att. providers found  Discharge Diagnosis: 1. Inflammatory Colitis  2. Iron Deficiency Anemia   Principal Problem:   Colitis Active Problems:   Long term (current) use of opiate analgesic   Osteoporosis   Colostomy in place [facility])   COPD (chronic obstructive pulmonary disease) (HCC)   Hypokalemia   History of ischemic colitis   S/P sigmoid colectomy   Rib pain on left side   Aortic atherosclerosis (HCC)   Iron deficiency anemia   Absolute anemia  Discharge Medications: Allergies as of 06/08/2017      Reactions   Ibuprofen Other (See Comments)   REACTION: Bleeding ulcers   Nsaids Other (See Comments)   REACTION: Bleeding Ulcer   Latex Itching      Medication List    STOP taking these medications   buPROPion 150 MG 12 hr tablet Commonly known as:  WELLBUTRIN SR   promethazine 12.5 MG tablet Commonly known as:  PHENERGAN     TAKE these medications   albuterol (2.5 MG/3ML) 0.083% nebulizer solution Commonly known as:  PROVENTIL Take 3 mLs (2.5 mg total) by nebulization every 6 (six) hours as needed for wheezing or shortness of breath.   albuterol 108 (90 Base) MCG/ACT inhaler Commonly known as:  PROVENTIL HFA;VENTOLIN HFA Inhale 2 puffs into the lungs every 6 (six) hours as needed for wheezing or shortness of breath.   alendronate 70 MG tablet Commonly known as:  FOSAMAX Take 1 tablet (70 mg total) by mouth once a week. Take with a full glass of water on an empty stomach.   dextromethorphan-guaiFENesin 30-600 MG 12hr tablet Commonly known as:  MUCINEX DM Take 1 tablet by mouth 2 (two) times daily.   diphenhydrAMINE 25 MG tablet Commonly known as:  BENADRYL Take 25 mg by mouth every 6 (six) hours as needed for allergies.   docusate sodium 100 MG capsule Commonly known as:   COLACE Take 2 capsules (200 mg total) by mouth daily.   esomeprazole 40 MG capsule Commonly known as:  NEXIUM Take 1 capsule (40 mg total) by mouth 2 (two) times daily before a meal.   fluticasone 50 MCG/ACT nasal spray Commonly known as:  FLONASE Place 2 sprays into both nostrils daily.   HYDROcodone-acetaminophen 7.5-325 MG tablet Commonly known as:  NORCO Take 1 tablet by mouth every 8 (eight) hours as needed for moderate pain. What changed:  Another medication with the same name was removed. Continue taking this medication, and follow the directions you see here.   metoCLOPramide 10 MG tablet Commonly known as:  REGLAN Take 1 tablet (10 mg total) by mouth every 8 (eight) hours as needed for refractory nausea / vomiting.   metoprolol tartrate 25 MG tablet Commonly known as:  LOPRESSOR Take 0.5 tablets (12.5 mg total) by mouth 2 (two) times daily.   morphine 15 MG 12 hr tablet Commonly known as:  MS CONTIN Take 2 tablets (30 mg total) by mouth every 12 (twelve) hours. What changed:  Another medication with the same name was removed. Continue taking this medication, and follow the directions you see here.   ondansetron 4 MG tablet Commonly known as:  ZOFRAN Take 1 tablet (4 mg total) by mouth every 8 (eight) hours as needed for nausea or vomiting.   polyethylene glycol packet Commonly known as:  MIRALAX /  GLYCOLAX Take 17 g by mouth daily as needed. What changed:  reasons to take this   pravastatin 40 MG tablet Commonly known as:  PRAVACHOL Take 1 tablet (40 mg total) by mouth daily.   Vitamin D2 2000 units Tabs Take 2,000 Units by mouth daily.      Disposition and follow-up:   [name] was discharged from [facility] in Stable condition.  At the hospital follow up visit please address:  1.  Inflammatory Colitis. Please ensure the patient is continuing PPI therapy and avoiding NSAIDs. Discuss options to help her continue to follow with GI  (financial barriers). Iron Deficiency Anemia. Likely from ongoing colitis. Please discuss iron replacement therapy and recheck a CBC in 6 months.  2.  Labs / imaging needed at time of follow-up: CBC in 6 months  3.  Pending labs/ test needing follow-up: None  Follow-up Appointments: Follow-up Information    [facility], [facility] Follow up.   Specialty:  [place] Why:  to resume Home Health as established prior to admission Contact information: [address] [place] [address] [phone]        Surgical, Denzil Hughes .   Contact information: [place] [facility] [address] [phone]        Ronnette Juniper, MD. Call.   Specialty:  Gastroenterology Why:  Please give their office a call to schedule follow up with Dr. Charyl Bigger information: [place] [address] [phone]        [place]. Go on 06/16/2017.   Why:  at 9:45 am for hospital follow up Contact information: [address] [facility] Course by problem list: Principal Problem:   Colitis Active Problems:   Long term (current) use of opiate analgesic   Osteoporosis   Colostomy in place [facility])   COPD (chronic obstructive pulmonary disease) (HCC)   Hypokalemia   History of ischemic colitis   S/P sigmoid colectomy   Rib pain on left side   Aortic atherosclerosis (HCC)   Iron deficiency anemia   Absolute anemia   Inflammatory Colitis.  Iron Deficiency Anemia. Patient is a 72 y.o female with a diverting colostomy secondary to Stercoral type ulcer perforation in 2013. Since that time she has had recurrent episodes of colitis. Review of medical records indicates that she has had ischemic colitis (2009), infectious colitis, and inflammatory (transmural inflammation 2013). CT abdomen this admission illustrated thickening of the transverse colon and hartman's pouch  consistent with colitis. Labs were significant for a new iron deficiency anemia. Stool studies were sent to rule out infectious colitis that ultimately returned negative. GI was consulted and performed proctoscopy with partial colonoscopy +/- endoscopy. These procedures illustrated wall thickening in the ascending colon and a pyloric stenosis. Biopsies were obtained the returned indeterminate but absent of malignant of dysplastic cells. Over the course of her hospitalization the patient's symptoms improved and she was discharged home in stable condition.  Discharge Vitals:   BP (!) 96/45 (BP Location: Right Arm)   Pulse 90   Temp 98.9 F (37.2 C) (Oral)   Resp (!) 22   Ht 5\' 4"  (1.626 m)   Wt 138 lb (62.6 kg)   LMP 08/25/1980   SpO2 96%   BMI 23.69 kg/m   Discharge Instructions: Discharge Instructions    AMB Referral to [facility]  Complete by:  As directed    Patient has history of ischemic colitis, stercoral ulcer with perforation, status post sigmoid colectomy with colostomy in 2013, duodenal stricture, chronic low back pain on opiates, osteoporosis, GERD, hyperlipidemia, COPD and legal blindness.  Patient verbalized interest to check options for medication assistance for Fosamax and inhaler for COPD (unable to recall name of inhaler) which she specifically was having trouble affording. Patient states that Fosamax is supplied every other month instead of monthly and she had stopped using the inhaler since February because she was unable to afford it.    Patient verbally agreedfor [facility] to be assisted with medication issues of affordability and adherence.    Reason for consult:  Referralto [facility] pharmacy formedication review, medication assistance (affordability) for medication adherence and explore other options to help afford medications after discharge.   Diagnoses of:  COPD/ Pneumonia   Expected date of contact:  1-3 days (reserved for hospital  discharges)   Call MD for:  persistant nausea and vomiting   Complete by:  As directed    Call MD for:  redness, tenderness, or signs of infection (pain, swelling, redness, odor or green/yellow discharge around incision site)   Complete by:  As directed    Call MD for:  severe uncontrolled pain   Complete by:  As directed    Call MD for:  temperature >100.4   Complete by:  As directed    Diet - low sodium heart healthy   Complete by:  As directed    Discharge instructions   Complete by:  As directed    Ms. Broshears,  It was a pleasure taking care of you. I am glad you are feeling better! Please continue to take all of your medications as previously prescribed. I have scheduled a hospital follow up appointment for you with the internal medicine clinic next week on Wednesday the 22nd at 9:45 am. Dr. Encarnacion Slates office should call you for a follow up appointment. If you do not hear from them by next week, please give their office a call. If you have any questions or concerns, call our clinic at [phone] or after hours call [phone] and ask for the internal medicine resident on call. Thank you!  - Dr. Philipp Ovens   Increase activity slowly   Complete by:  As directed      Signed: Ina Homes, MD 06/10/2017, 8:58 AM   My Pager: [phone]

## 2017-06-05 NOTE — Progress Notes (Signed)
Subjective: Ongoing mucous and loose movements through ostomy and rectal stump. Now also reports some mucous per vagina. Abdominal pain not much better.  Objective: Vital signs in last 24 hours: Temp:  [98.3 F (36.8 C)-98.4 F (36.9 C)] 98.3 F (36.8 C) (05/11 0554) Pulse Rate:  [86-90] 90 (05/11 0554) Resp:  [16-22] 16 (05/11 0554) BP: (104-115)/(46-55) 115/55 (05/11 0554) SpO2:  [93 %-98 %] 93 % (05/11 0554) Weight change:  Last BM Date: 06/04/17  PE: GEN:  NAD ABD:  Soft, mild tenderness, LLQ ostomy, small parastomal hernia, no peritonitis  Lab Results: CBC    Component Value Date/Time   WBC 5.9 06/05/2017 0445   RBC 3.85 (L) 06/05/2017 0445   HGB 8.9 (L) 06/05/2017 0445   HGB 14.9 07/20/2016 1625   HCT 30.5 (L) 06/05/2017 0445   HCT 46.4 07/20/2016 1625   PLT 259 06/05/2017 0445   PLT 326 07/20/2016 1625   MCV 79.2 06/05/2017 0445   MCV 93 07/20/2016 1625   MCH 23.1 (L) 06/05/2017 0445   MCHC 29.2 (L) 06/05/2017 0445   RDW 21.5 (H) 06/05/2017 0445   RDW 13.3 07/20/2016 1625   LYMPHSABS 1.0 06/03/2017 1645   LYMPHSABS 2.8 07/20/2016 1625   MONOABS 0.3 06/03/2017 1645   EOSABS 0.0 06/03/2017 1645   EOSABS 0.0 07/20/2016 1625   BASOSABS 0.0 06/03/2017 1645   BASOSABS 0.0 07/20/2016 1625   CMP     Component Value Date/Time   NA 144 06/05/2017 0445   NA 138 06/23/2016 1441   K 4.3 06/05/2017 0445   CL 113 (H) 06/05/2017 0445   CO2 26 06/05/2017 0445   GLUCOSE 80 06/05/2017 0445   BUN <5 (L) 06/05/2017 0445   BUN 13 06/23/2016 1441   CREATININE 0.57 06/05/2017 0445   CREATININE 0.69 10/11/2013 1700   CALCIUM 8.0 (L) 06/05/2017 0445   PROT 4.2 (L) 06/04/2017 0049   ALBUMIN 1.9 (L) 06/04/2017 0049   AST 32 06/04/2017 0049   ALT 12 (L) 06/04/2017 0049   ALKPHOS 129 (H) 06/04/2017 0049   BILITOT 0.3 06/04/2017 0049   GFRNONAA >60 06/05/2017 0445   GFRNONAA 88 10/11/2013 1700   GFRAA >60 06/05/2017 0445   GFRAA >89 10/11/2013 1700   Assessment:  1.   Abdominal pain. 2.  Colitis, rectal stump and colostomy-draining colon. 3.  Nausea and vomiting. 4.  History duodenal stenosis. 5.  Chronic constipation (sounds like pelvic floor outlet problems) with colostomy and Hartmann's pouch for perforated stercoral ulcer. 6.  Mild anemia without overt GI bleeding.  Plan:  1.  Continuing antibiotics. 2.  Awaiting stool study results (already sent). 3.  Clear liquid diet today; if she feels better tomorrow, could consider full liquid diet. 4.  [facility] will revisit Monday; plan for likely colonoscopy (via ostomy), proctoscopy (via Hartmann's pouch), endoscopy, all early-mid next week.   Landry Dyke 06/05/2017, 8:31 AM   Cell [phone] If no answer or after 5 PM call [phone]

## 2017-06-05 NOTE — Progress Notes (Signed)
   Subjective: Patient is doing fair this AM. She continues to have lower abdominal pain however, feels it is a little better with the increased frequency of pain medication yesterday. She continues to have mucus from the rectum and also expresses that she does have mucus from the vagina during these episodes. She understands the plan to wait on stool PCR then possible go for partial colonoscopy early next week. All questions and concerns addressed.   Objective: Vital signs in last 24 hours: Vitals:   06/03/17 2153 06/04/17 0541 06/04/17 1452 06/04/17 1503  BP: (!) 121/46 (!) 115/52 (!) 104/46   Pulse: 80 81 88 86  Resp: 20 20 (!) 22 20  Temp: 99.3 F (37.4 C) (!) 97.3 F (36.3 C) 98.4 F (36.9 C)   TempSrc: Oral Oral Oral   SpO2: 90% 100% 98% 98%  Weight: 138 lb 0.1 oz (62.6 kg)     Height: 5\' 4"  (1.626 m)      General: Well nourished female in no acute distress Pulm: Good air movement with diffuse wheezing CV: RRR, no murmurs, no rubs  Abdomen: Active bowel sounds, soft, tenderness to palpation that is worse in the lower abdomen.  Assessment/Plan:  Diffuse Colitis. Patient is a 72 y.o female with a diverting colostomy secondary to Stercoral type ulcer perforation in 2013. Since that time she has had recurrent episodes of colitis. Review of medical records indicates that she has had ischemic colitis (2009), infectious colitis, and inflammatory (transmural inflammation 2013). CT abdomen this admission illustrated thickening of the transverse colon and hartman's pouch consistent with colitis. Labs were significant for a new iron deficiency anemia.  - Differential includes infectious vs inflammatory (micorscopic colitis, ulcerative, Crohn's) vs ischemic vs diversion colitis  - Follow stool studies  - Continue Ceftriaxone and Metronidazole (Day #3) - Pain management with morphine and Norco. May need to restart bowel regimen once ostomy output slows. - GI recommends supportive care but may  need proctoscopy with partial colonoscopy +/- endoscopy   Iron Deficiency Anemia  - Ferritin 6 - Evaluation as per above   Chronic Low Back Pain (on chronic opioids) - Continue home morphine 30mg  q12h - Continue home Norco 7.5-325 q6h PRN - Holding bowl regimen in the setting of increased ostomy out put, resume as needed  Rib pain Osteoporosis: On Alendronate (on fridays) and Vit D supplementation for osteoporosis. Reports rib pain with point tenderness on exam. Rib film with no evidence of acute fracture. - Holding Alendronate given patients GI symptoms - Continue home Vit D - Pain control as above  GERD: On esomeprazole 40mg  BID at home - Protonix 40mg  Daily  Hyperlipidemia - Continue home pravastatin  COPD - Allbuterol neb q6h PRN  Dispo: Anticipated discharge in approximately >1 day(s).   Ina Homes, MD 06/05/2017, 5:53 AM My Pager: [phone]

## 2017-06-05 NOTE — Evaluation (Signed)
Physical Therapy Evaluation Patient Details Name: [name] Bell MRN: [id number] DOB: 1945-03-19 Today's Date: 06/05/2017   History of Present Illness  [name] Bell is a 72 y.o. female with multiple medical problems including colostomy 2013 for perforated stercoral ulcer.  Has had few prior admissions for pain and colitis, most recently about one year ago; exam 2009, pre-colostomy, for similar reasons, showed ischemic colitis.  Began having progressive generalized abdominal pain, increase colostomy output, and increase mucous per rectum and nausea/vomiting, all about one week ago.    Clinical Impression  Pt admitted with above diagnosis. Pt currently with functional limitations due to the deficits listed below (see PT Problem List). Pt was able to move fairly well to chair and back to bed.  Does get SOB with DOE 3/4 with rest breaks needed.  Pt states she is close to baseline status.  Feel that once medical issues are resolved, pt will be able to return home with HHPT,HHOT and HHAide as she had prior to admit.   Pt will benefit from skilled PT to increase their independence and safety with mobility to allow discharge to the venue listed below.      Follow Up Recommendations Home health PT;Supervision - Intermittent    Equipment Recommendations  Wheelchair (measurements PT);Wheelchair cushion (measurements PT)    Recommendations for Other Services       Precautions / Restrictions Precautions Precautions: Fall Restrictions Weight Bearing Restrictions: No      Mobility  Bed Mobility Overal bed mobility: Needs Assistance Bed Mobility: Supine to Sit     Supine to sit: Supervision;Min guard     General bed mobility comments: no assist needed but incr time needed and DOE 3/4 needing rest breaks.   Transfers Overall transfer level: Needs assistance Equipment used: 1 person hand held assist Transfers: Sit to/from [facility] Sit to Stand: Min assist Stand pivot  transfers: Min assist       General transfer comment: Needs steadying assist to step around to chair.  Once pt to chair, DOE 3/4 and pt needed to rest and could not stay up in chair due to pressure on hernia/colostomy.  Assisted her back to bed and she asked to call nurse for breathing treatment.  Nurse states she will bring one shortly.  O2 sats 94%. Flexed posture in standing.   Ambulation/Gait             General Gait Details: unable  Stairs            Wheelchair Mobility    Modified Rankin (Stroke Patients Only)       Balance Overall balance assessment: Needs assistance Sitting-balance support: No upper extremity supported;Feet supported Sitting balance-Leahy Scale: Fair     Standing balance support: Single extremity supported;During functional activity Standing balance-Leahy Scale: Poor Standing balance comment: relies on bil UE support for standing and does not stand fully upright. Flexed posture                             Pertinent Vitals/Pain Pain Assessment: No/denies pain    Home Living Family/patient expects to be discharged to:: Private residence Living Arrangements: Alone Available Help at Discharge: Personal care attendant(7 days week, 3 hours day) Type of Home: Independent living facility Home Access: Ramped entrance     Home Layout: One level Home Equipment: [place] - 4 wheels;Bedside commode;Shower seat Additional Comments: visually impaired    Prior Function Level of Independence: Needs assistance  Gait / Transfers Assistance Needed: walks outdoors around block with PCA daily, uses rollator in home per pt  ADL's / Homemaking Assistance Needed: has aide        Hand Dominance        Extremity/Trunk Assessment   Upper Extremity Assessment Upper Extremity Assessment: Defer to OT evaluation    Lower Extremity Assessment Lower Extremity Assessment: Generalized weakness    Cervical / Trunk Assessment Cervical /  Trunk Assessment: Kyphotic  Communication   Communication: No difficulties  Cognition Arousal/Alertness: Awake/alert Behavior During Therapy: Flat affect Overall Cognitive Status: Within Functional Limits for tasks assessed                                        General Comments      Exercises General Exercises - Lower Extremity Ankle Circles/Pumps: AROM;Both;5 reps;Supine Long Arc Quad: AROM;Both;5 reps;Seated   Assessment/Plan    PT Assessment Patient needs continued PT services  PT Problem List Decreased activity tolerance;Decreased balance;Decreased mobility;Decreased knowledge of use of DME;Decreased safety awareness;Cardiopulmonary status limiting activity;Decreased knowledge of precautions       PT Treatment Interventions DME instruction;Gait training;Functional mobility training;Therapeutic activities;Therapeutic exercise;Balance training;Patient/family education    PT Goals (Current goals can be found in the Care Plan section)  Acute Rehab PT Goals Patient Stated Goal: to go home PT Goal Formulation: With patient Time For Goal Achievement: 06/19/17 Potential to Achieve Goals: Good    Frequency Min 3X/week   Barriers to discharge Decreased caregiver support      Co-evaluation               AM-PAC PT "6 Clicks" Daily Activity  Outcome Measure Difficulty turning over in bed (including adjusting bedclothes, sheets and blankets)?: None Difficulty moving from lying on back to sitting on the side of the bed? : None Difficulty sitting down on and standing up from a chair with arms (e.g., wheelchair, bedside commode, etc,.)?: A Little Help needed moving to and from a bed to chair (including a wheelchair)?: A Little Help needed walking in hospital room?: Total Help needed climbing 3-5 steps with a railing? : Total 6 Click Score: 16    End of Session Equipment Utilized During Treatment: Gait belt Activity Tolerance: Patient limited by  fatigue Patient left: in bed;with call bell/phone within reach;with bed alarm set Nurse Communication: Mobility status(breathng treatment requested) PT Visit Diagnosis: Unsteadiness on feet (R26.81);Muscle weakness (generalized) (M62.81)    Time: 2671-2458 PT Time Calculation (min) (ACUTE ONLY): 16 min   Charges:   PT Evaluation $PT Eval Moderate Complexity: 1 Mod     PT G Codes:        Serapio Edelson,PT Acute Rehabilitation [phone] (pager)   Denice Paradise 06/05/2017, 4:08 PM

## 2017-06-06 LAB — BASIC METABOLIC PANEL
Anion gap: 9 (ref 5–15)
CHLORIDE: 106 mmol/L (ref 101–111)
CO2: 29 mmol/L (ref 22–32)
CREATININE: 0.54 mg/dL (ref 0.44–1.00)
Calcium: 8.3 mg/dL — ABNORMAL LOW (ref 8.9–10.3)
Glucose, Bld: 87 mg/dL (ref 65–99)
POTASSIUM: 3.6 mmol/L (ref 3.5–5.1)
SODIUM: 144 mmol/L (ref 135–145)

## 2017-06-06 MED ORDER — POTASSIUM CHLORIDE 20 MEQ/15ML (10%) PO SOLN
40.0000 meq | Freq: Once | ORAL | Status: AC
  Administered 2017-06-06: 40 meq via ORAL
  Filled 2017-06-06: qty 30

## 2017-06-06 MED ORDER — ALBUTEROL SULFATE (2.5 MG/3ML) 0.083% IN NEBU
2.5000 mg | INHALATION_SOLUTION | RESPIRATORY_TRACT | Status: DC | PRN
  Administered 2017-06-07 – 2017-06-08 (×2): 2.5 mg via RESPIRATORY_TRACT
  Filled 2017-06-06 (×2): qty 3

## 2017-06-06 MED ORDER — ENOXAPARIN SODIUM 40 MG/0.4ML ~~LOC~~ SOLN
40.0000 mg | SUBCUTANEOUS | Status: DC
  Administered 2017-06-06 – 2017-06-08 (×3): 40 mg via SUBCUTANEOUS
  Filled 2017-06-06 (×3): qty 0.4

## 2017-06-06 MED ORDER — POTASSIUM CHLORIDE CRYS ER 20 MEQ PO TBCR: 40.0000 meq | EXTENDED_RELEASE_TABLET | Freq: Once | ORAL | Status: DC

## 2017-06-06 MED ORDER — ALBUTEROL SULFATE (2.5 MG/3ML) 0.083% IN NEBU
2.5000 mg | INHALATION_SOLUTION | Freq: Two times a day (BID) | RESPIRATORY_TRACT | Status: DC
  Administered 2017-06-06: 2.5 mg via RESPIRATORY_TRACT
  Filled 2017-06-06: qty 3

## 2017-06-06 NOTE — Progress Notes (Signed)
  Date: 06/06/2017  Patient name: [name] Bell  Medical record number: [id number]  Date of birth: [birth year]   I have seen and evaluated this patient and I have discussed the plan of care with the house staff. Please see their note for complete details. I concur with their findings.  Bartholomew Crews, MD 06/06/2017, 2:15 PM

## 2017-06-06 NOTE — Progress Notes (Signed)
   Subjective: Patient doing better this AM. Pain is not as severe. She is up for advancing her diet a little as GI recommended. We discussed that her GI pathogen panel came back negative but this does not completely exclude infectious causes. We discussed continued coordination of care with GI. All questions and concerns addressed.   Objective: Vital signs in last 24 hours: Vitals:   06/05/17 1305 06/05/17 1555 06/05/17 2155 06/06/17 0534  BP: (!) 113/47  (!) 110/46 (!) 111/45  Pulse: 86  94 96  Resp: 16 18 18 20   Temp: 99.3 F (37.4 C)  99.4 F (37.4 C) 98.8 F (37.1 C)  TempSrc: Oral  Oral Oral  SpO2: 92% 99% 92% 90%  Weight:      Height:       General: Well nourished female in no acute distress Pulm: Good air movement with no wheezing or crackles  CV: RRR, no murmurs, no rubs  Abdomen: Active bowel sounds, soft, ostomy bag in place, tenderness to palpation of the lower abdomen   Assessment/Plan:  Diffuse Colitis.Patient is a 72 y.o female with a diverting colostomy secondary to Stercoral type ulcer perforation in 2013. Since that time she has had recurrent episodes of colitis. Review of medical records indicates that she has had ischemic colitis (2009), infectious colitis, and inflammatory (transmural inflammation 2013). CT abdomen this admission illustrated thickening of the transverse colon and hartman's pouch consistent with colitis. Labs were significant for a new iron deficiency anemia. GI pathogen PCR has returned negative making infectious less likely.  - Differential includes inflammatory (micorscopic colitis, ulcerative, Crohn's) vs ischemic vs diversion colitis  - Continue Ceftriaxone and Metronidazole (Day #4) today, will discontinue tomorrow if continues to improve  - Pain management with morphine and Norco. May need to restart bowel regimen once ostomy output slows. - GI recommends supportive care but may need proctoscopy with partial colonoscopy +/- endoscopy -  Advanced diet to full liquid   Iron Deficiency Anemia  - Ferritin 6 - Evaluation as per above  Chronic Low Back Pain (on chronic opioids) - Continue home morphine 30mg  q12h - Continue home Norco 7.5-325 q6h PRN - Holding bowl regimen in the setting of increased ostomy out put, resume as needed  Rib pain Osteoporosis: On Alendronate (on fridays) and Vit D supplementation for osteoporosis. Reports rib pain with point tenderness on exam. Rib film with no evidence of acute fracture. - Holding Alendronate given patients GI symptoms - Continue home Vit D - Pain control as above  GERD: On esomeprazole 40mg  BID at home - Protonix 40mg  Daily  Hyperlipidemia - Continue home pravastatin  COPD - Allbuterol neb q6h PRN  Dispo: Anticipated discharge in approximately >1 day(s).   Ina Homes, MD 06/06/2017, 11:21 AM My Pager: [phone]

## 2017-06-07 DIAGNOSIS — I959 Hypotension, unspecified: Secondary | ICD-10-CM

## 2017-06-07 LAB — BASIC METABOLIC PANEL
Anion gap: 5 (ref 5–15)
CO2: 29 mmol/L (ref 22–32)
CREATININE: 0.57 mg/dL (ref 0.44–1.00)
Calcium: 8.1 mg/dL — ABNORMAL LOW (ref 8.9–10.3)
Chloride: 109 mmol/L (ref 101–111)
GFR calc Af Amer: >60 mL/min (ref >60)
Glucose, Bld: 84 mg/dL (ref 65–99)
Potassium: 3.8 mmol/L (ref 3.5–5.1)
SODIUM: 143 mmol/L (ref 135–145)

## 2017-06-07 MED ORDER — SODIUM CHLORIDE 0.9 % IV BOLUS
1000.0000 mL | Freq: Once | INTRAVENOUS | Status: AC
  Administered 2017-06-07: 1000 mL via INTRAVENOUS

## 2017-06-07 MED ORDER — SODIUM CHLORIDE 0.9 % IV SOLN
510.0000 mg | INTRAVENOUS | Status: DC
  Administered 2017-06-07: 510 mg via INTRAVENOUS
  Filled 2017-06-07: qty 17

## 2017-06-07 MED ORDER — PEG 3350-KCL-NA BICARB-NACL 420 G PO SOLR
4000.0000 mL | Freq: Once | ORAL | Status: AC
  Administered 2017-06-07: 4000 mL via ORAL
  Filled 2017-06-07: qty 4000

## 2017-06-07 MED ORDER — METOCLOPRAMIDE HCL 5 MG/ML IJ SOLN
5.0000 mg | Freq: Once | INTRAMUSCULAR | Status: AC
  Administered 2017-06-07: 5 mg via INTRAVENOUS
  Filled 2017-06-07: qty 2

## 2017-06-07 NOTE — Anesthesia Preprocedure Evaluation (Addendum)
Anesthesia Evaluation  Patient identified by MRN, date of birth, ID band Patient awake    Reviewed: Allergy & Precautions, H&P , NPO status , Patient's Chart, lab work & pertinent test results  Airway Mallampati: III  TM Distance: >3 FB Neck ROM: Full    Dental no notable dental hx. (+) Edentulous Upper, Edentulous Lower, Dental Advisory Given   Pulmonary COPD,  COPD inhaler, former smoker,    Pulmonary exam normal breath sounds clear to auscultation       Cardiovascular Exercise Tolerance: Good + Peripheral Vascular Disease   Rhythm:Regular Rate:Normal     Neuro/Psych  Headaches, Anxiety Depression    GI/Hepatic Neg liver ROS, PUD,   Endo/Other  negative endocrine ROS  Renal/GU negative Renal ROS  negative genitourinary   Musculoskeletal  (+) Arthritis , Osteoarthritis,    Abdominal   Peds  Hematology negative hematology ROS (+) anemia ,   Anesthesia Other Findings   Reproductive/Obstetrics negative OB ROS                            Anesthesia Physical Anesthesia Plan  ASA: II  Anesthesia Plan: MAC   Post-op Pain Management:    Induction: Intravenous  PONV Risk Score and Plan: 2 and Propofol infusion and Treatment may vary due to age or medical condition  Airway Management Planned: Nasal Cannula  Additional Equipment:   Intra-op Plan:   Post-operative Plan:   Informed Consent: I have reviewed the patients History and Physical, chart, labs and discussed the procedure including the risks, benefits and alternatives for the proposed anesthesia with the patient or authorized representative who has indicated his/her understanding and acceptance.   Dental advisory given  Plan Discussed with: CRNA  Anesthesia Plan Comments:         Anesthesia Quick Evaluation

## 2017-06-07 NOTE — Progress Notes (Signed)
Internal Medicine Attending  Date: 06/07/2017  Patient name: [name] Bell Medical record number: [id number] Date of birth: October 30, 1945 Age: 72 y.o. Gender: female  I saw and evaluated the patient. I reviewed the resident's note by Dr. Philipp Ovens and I agree with the resident's findings and plans as documented in her progress note.  When seen on rounds this morning [name] Bell noted some fullness and discomfort around her colostomy site. On examination she was mildly tender but otherwise without new abnormality. We are providing her with Feraheme for her iron deficiency and stopping her antibiotics now that she has completed her course. We are awaiting GI's assessment as to the need for endoscopy of the Hartman's pouch and her remaining colon through the colostomy.

## 2017-06-07 NOTE — Progress Notes (Signed)
Patient having nausea and vomiting with prep.Not time for prn dose zofran.Dr.Harden notified new order received and carried out.

## 2017-06-07 NOTE — Progress Notes (Signed)
   Subjective: Continues to improve, tolerating liquid diet well.   Objective:  Vital signs in last 24 hours: Vitals:   06/06/17 1413 06/06/17 1947 06/06/17 2039 06/07/17 0455  BP: (!) 103/39  (!) 122/43 (!) 96/41  Pulse: 97  93 90  Resp: 20  18 18   Temp: 99.5 F (37.5 C)  98.3 F (36.8 C) 98.2 F (36.8 C)  TempSrc: Oral     SpO2: 92% 98% 92% 91%  Weight:      Height:       Physical Exam Constitutional: NAD, appears comfortable Cardiovascular: RRR, no murmurs, rubs, or gallops.  Pulmonary/Chest: CTAB, no wheezes, rales, or rhonchi.  Abdominal: Soft, mildly tender around her ostomy site, distended due to chronic hernia, +BS  Extremities: Warm and well perfused. No edema.    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 Since that time she has had recurrent episodes of colitis. Review of medical records indicates that she has had ischemic colitis (2009), infectious colitis, and inflammatory (transmural inflammation 2013). CT abdomen this admission illustrated thickening of the transverse colon and hartman's pouch consistent with colitis. Labs were significant for a new iron deficiency anemia. GI pathogen PCR has returned negative making infectious less likely.  -- Differential includes inflammatory (micorscopic colitis, ulcerative, Crohn's) vs ischemic vs diversion colitis  -- Patient has completed 4 days of IV Ceftriaxone and Metronidazole which is within the recommended abx course time frame for infectious colitis (3-5 days). Discontinue abx today.  -- Pain management with morphineand Norco. May need to restart bowel regimen once ostomy output slows. -- GI recommends supportive care but may need proctoscopy with partial colonoscopy +/- endoscopy, will follow up recommendations  -- Continue full liquid diet  Iron Deficiency Anemia  --Ferritin 6 -- Evaluation as per above;  possible endoscopy pending GI recs   Hypotension: Mildly symptomatic with dizziness. Likely due to poor PO intake.  -- 1L NS bolus -- Monitor   Chronic Low Back Pain (on chronic opioids) -- Continue home morphine 30mg  q12h -- Continue home Norco 7.5-325 q6h PRN -- Holding bowl regimen in the setting of increased ostomy out put, resume as needed  Rib pain Osteoporosis: On Alendronate (on fridays) and Vit D supplementation for osteoporosis. Reports rib pain with point tenderness on exam.Rib film with no evidence of acute fracture. -- Holding Alendronate given patients GI symptoms -- Continue home Vit D -- Pain control as above  GERD: On esomeprazole 40mg  BID at home -- Protonix 40mg  Daily  Hyperlipidemia -- Continue home pravastatin  COPD -- Allbuterol neb q6h PRN  Dispo: Anticipated discharge pending advancement in diet and GI recommendations for endoscopy.   Velna Ochs, MD 06/07/2017, 10:57 AM Pager: [phone]

## 2017-06-07 NOTE — H&P (View-Only) (Signed)
Pt feels somewhat better today.    States she has periistomal abd pain "all the time" and dysphagia sx (food comes back up) but hasn't sought medical attention for it.    At present, she has excellent bowel sounds and abd is nontender.  There is no evident incarceration of her peristomal hernia.  Ostomy has lots of gas and liquid dark stool.  IMPR:    1. Not clear if she has resolving colitis 2. Chronic abd pain 3. H/o esophageal and duodenal stenosis, dilated a year ago.  Current sx c/w esoph dysphagia  RECOMM:  (procedures scheduled for tomorrow morning):  1.   EGD w/ possible esoph dilatation, to eval abd pain, n/v, and dysphagia 2.  Colonoscopy through stoma to eval for colitis (would consider random bx's if endoscopically normal), watching to prevent impaction of scope in peristomal hernia 3.  Proctoscopy of Hartman's pouch (reports periodic mucoid dischg, and CT showed evid of peri-rectal inflamm).  I have discussed the nature, purpose, and risks of these procedures w/ pt, w/ particular reference to the risk of perforation w/ dilatations, and she is very agreeable and desirous of proceeding.  Cleotis Nipper, M.D. Pager [phone] If no answer or after 5 PM call [phone]  Addendum: time at bedside approximately 30-35'

## 2017-06-07 NOTE — Progress Notes (Addendum)
Pt feels somewhat better today.    States she has periistomal abd pain "all the time" and dysphagia sx (food comes back up) but hasn't sought medical attention for it.    At present, she has excellent bowel sounds and abd is nontender.  There is no evident incarceration of her peristomal hernia.  Ostomy has lots of gas and liquid dark stool.  IMPR:    1. Not clear if she has resolving colitis 2. Chronic abd pain 3. H/o esophageal and duodenal stenosis, dilated a year ago.  Current sx c/w esoph dysphagia  RECOMM:  (procedures scheduled for tomorrow morning):  1.   EGD w/ possible esoph dilatation, to eval abd pain, n/v, and dysphagia 2.  Colonoscopy through stoma to eval for colitis (would consider random bx's if endoscopically normal), watching to prevent impaction of scope in peristomal hernia 3.  Proctoscopy of Hartman's pouch (reports periodic mucoid dischg, and CT showed evid of peri-rectal inflamm).  I have discussed the nature, purpose, and risks of these procedures w/ pt, w/ particular reference to the risk of perforation w/ dilatations, and she is very agreeable and desirous of proceeding.  Cleotis Nipper, M.D. Pager [phone] If no answer or after 5 PM call [phone]  Addendum: time at bedside approximately 30-35'

## 2017-06-08 ENCOUNTER — Inpatient Hospital Stay (HOSPITAL_COMMUNITY): Payer: Medicare Other | Admitting: Anesthesiology

## 2017-06-08 ENCOUNTER — Encounter (HOSPITAL_COMMUNITY): Payer: Self-pay | Admitting: *Deleted

## 2017-06-08 ENCOUNTER — Encounter (HOSPITAL_COMMUNITY)
Admission: EM | Disposition: A | Discharge status: Home Health | Payer: Self-pay | Source: Home / Self Care | Attending: Internal Medicine

## 2017-06-08 DIAGNOSIS — K311 Adult hypertrophic pyloric stenosis: Secondary | ICD-10-CM

## 2017-06-08 DIAGNOSIS — K469 Unspecified abdominal hernia without obstruction or gangrene: Secondary | ICD-10-CM

## 2017-06-08 DIAGNOSIS — R1310 Dysphagia, unspecified: Secondary | ICD-10-CM

## 2017-06-08 DIAGNOSIS — K56699 Other intestinal obstruction unspecified as to partial versus complete obstruction: Secondary | ICD-10-CM

## 2017-06-08 DIAGNOSIS — K222 Esophageal obstruction: Secondary | ICD-10-CM

## 2017-06-08 HISTORY — PX: BALLOON DILATION: SHX5330

## 2017-06-08 HISTORY — PX: ESOPHAGOGASTRODUODENOSCOPY (EGD) WITH PROPOFOL: SHX5813

## 2017-06-08 HISTORY — PX: BIOPSY: SHX5522

## 2017-06-08 HISTORY — PX: COLONOSCOPY WITH PROPOFOL: SHX5780

## 2017-06-08 LAB — CBC
HEMATOCRIT: 33.7 % — AB (ref 36.0–46.0)
Hemoglobin: 9.9 g/dL — ABNORMAL LOW (ref 12.0–15.0)
MCH: 23.1 pg — AB (ref 26.0–34.0)
MCHC: 29.4 g/dL — ABNORMAL LOW (ref 30.0–36.0)
MCV: 78.7 fL (ref 78.0–100.0)
PLATELETS: 258 10*3/uL (ref 150–400)
RBC: 4.28 MIL/uL (ref 3.87–5.11)
RDW: 21.5 % — AB (ref 11.5–15.5)
WBC: 3.8 10*3/uL — ABNORMAL LOW (ref 4.0–10.5)

## 2017-06-08 LAB — BASIC METABOLIC PANEL
Anion gap: 8 (ref 5–15)
CHLORIDE: 104 mmol/L (ref 101–111)
CO2: 32 mmol/L (ref 22–32)
CREATININE: 0.57 mg/dL (ref 0.44–1.00)
Calcium: 8.4 mg/dL — ABNORMAL LOW (ref 8.9–10.3)
GFR calc Af Amer: >60 mL/min (ref >60)
GFR calc non Af Amer: >60 mL/min (ref >60)
Glucose, Bld: 86 mg/dL (ref 65–99)
POTASSIUM: 3.6 mmol/L (ref 3.5–5.1)
Sodium: 144 mmol/L (ref 135–145)

## 2017-06-08 SURGERY — ESOPHAGOGASTRODUODENOSCOPY (EGD) WITH PROPOFOL
Anesthesia: Monitor Anesthesia Care

## 2017-06-08 SURGERY — COLONOSCOPY WITH PROPOFOL
Anesthesia: Monitor Anesthesia Care

## 2017-06-08 MED ORDER — PROPOFOL 10 MG/ML IV BOLUS
INTRAVENOUS | Status: DC | PRN
  Administered 2017-06-08: 30 mg via INTRAVENOUS
  Administered 2017-06-08 (×5): 20 mg via INTRAVENOUS

## 2017-06-08 MED ORDER — METOPROLOL TARTRATE 12.5 MG HALF TABLET
12.5000 mg | ORAL_TABLET | Freq: Two times a day (BID) | ORAL | Status: DC
  Administered 2017-06-08: 12.5 mg via ORAL
  Filled 2017-06-08: qty 1

## 2017-06-08 MED ORDER — PROPOFOL 500 MG/50ML IV EMUL
INTRAVENOUS | Status: DC | PRN
  Administered 2017-06-08: 100 ug/kg/min via INTRAVENOUS

## 2017-06-08 MED ORDER — DIPHENHYDRAMINE HCL 25 MG PO TABS
25.0000 mg | ORAL_TABLET | Freq: Four times a day (QID) | ORAL | Status: DC | PRN
  Filled 2017-06-08: qty 1

## 2017-06-08 MED ORDER — SODIUM CHLORIDE 0.9 % IV SOLN: INTRAVENOUS | Status: DC

## 2017-06-08 MED ORDER — BUTAMBEN-TETRACAINE-BENZOCAINE 2-2-14 % EX AERO
INHALATION_SPRAY | CUTANEOUS | Status: DC | PRN
  Administered 2017-06-08: 1 via TOPICAL

## 2017-06-08 MED ORDER — ALBUTEROL SULFATE HFA 108 (90 BASE) MCG/ACT IN AERS: 2.0000 | INHALATION_SPRAY | Freq: Four times a day (QID) | RESPIRATORY_TRACT | 3 refills | Status: DC | PRN

## 2017-06-08 MED ORDER — FLUTICASONE PROPIONATE 50 MCG/ACT NA SUSP
2.0000 | Freq: Every day | NASAL | Status: DC
  Administered 2017-06-08: 2 via NASAL
  Filled 2017-06-08: qty 16

## 2017-06-08 MED ORDER — METOCLOPRAMIDE HCL 10 MG PO TABS: 10.0000 mg | ORAL_TABLET | Freq: Three times a day (TID) | ORAL | 1 refills | Status: DC | PRN

## 2017-06-08 MED ORDER — ALENDRONATE SODIUM 70 MG PO TABS: 70.0000 mg | ORAL_TABLET | ORAL | Status: DC

## 2017-06-08 MED ORDER — ALBUTEROL SULFATE (2.5 MG/3ML) 0.083% IN NEBU
INHALATION_SOLUTION | RESPIRATORY_TRACT | Status: AC
  Filled 2017-06-08: qty 3

## 2017-06-08 MED ORDER — ONDANSETRON HCL 4 MG PO TABS: 4.0000 mg | ORAL_TABLET | Freq: Three times a day (TID) | ORAL | 0 refills | Status: DC | PRN

## 2017-06-08 MED ORDER — DM-GUAIFENESIN ER 30-600 MG PO TB12
1.0000 | ORAL_TABLET | Freq: Two times a day (BID) | ORAL | Status: DC
  Administered 2017-06-08: 1 via ORAL
  Filled 2017-06-08: qty 1

## 2017-06-08 MED ORDER — ALBUTEROL SULFATE HFA 108 (90 BASE) MCG/ACT IN AERS: 2.0000 | INHALATION_SPRAY | Freq: Four times a day (QID) | RESPIRATORY_TRACT | Status: DC | PRN

## 2017-06-08 MED ORDER — LACTATED RINGERS IV SOLN
INTRAVENOUS | Status: DC | PRN
  Administered 2017-06-08: 1000 mL via INTRAVENOUS

## 2017-06-08 SURGICAL SUPPLY — 25 items

## 2017-06-08 NOTE — Op Note (Signed)
EGD was performed for dysphagia. Findings: Benign-appearing stenosis at GE junction, balloon dilatation with 18 mm balloon, complete resolution of stenosis and able to pass an adult scope into the gastric cavity thereafter. Benign-appearing stenosis at the pyloric channel, balloon dilatation with 16.5 mm balloon,complete resolution of stenosis and able to pass an adult gastroscope and the duodenal bulb and rest of the duodenum thereafter. Normal-appearing cardia and fundus on retroflexion.   Colonoscopy findings: Adult gastroscope was advanced from the anal canal up to the rectal stump.  Mucosa of the entire anal canal and rectum and rectosigmoid appeared unremarkable up to 12 cm from insertion.  Pediatric colonoscope advanced through the stoma up to the cecum identified by ileocecal valve and appendiceal orifice. Mild thickening of ascending colon noted, biopsies taken. Mucosa rest of the colon appeared unremarkable.  Recommendations: Regular diet. PPI daily, avoid NSAIDs. Outpatient endoscopy in 2 months for repeat therapy of esophageal stenosis and pyloric stenosis. Pathology may be followed as an outpatient. Okay to discharge from GI standpoint.  Ronnette Juniper, M.D.

## 2017-06-08 NOTE — Op Note (Signed)
[facility] Patient Name: [name] Bell Procedure Date : 06/08/2017 MRN: [id number] Attending MD: Ronnette Juniper , MD Date of Birth: 1945-10-24 CSN: [id number] Age: 72 Admit Type: Inpatient Procedure:                Upper GI endoscopy Indications:              Dysphagia, prior esophageal and pyloric channel                            stenosis requiring dilation Providers:                Ronnette Juniper, MD, Burtis Junes, RN, Alan Mulder,                            Technician, Lance Coon, CRNA Referring MD:              Medicines:                Monitored Anesthesia Care Complications:            No immediate complications. Estimated Blood Loss:     Estimated blood loss was minimal. Procedure:                Pre-Anesthesia Assessment:                           - Prior to the procedure, a History and Physical                            was performed, and patient medications and                            allergies were reviewed. The patient's tolerance of                            previous anesthesia was also reviewed. The risks                            and benefits of the procedure and the sedation                            options and risks were discussed with the patient.                            All questions were answered, and informed consent                            was obtained. Prior Anticoagulants: The patient has                            taken no previous anticoagulant or antiplatelet                            agents. ASA Grade Assessment: III - A patient with  severe systemic disease. After reviewing the risks                            and benefits, the patient was deemed in                            satisfactory condition to undergo the procedure.                           After obtaining informed consent, the endoscope was                            passed under direct vision. Throughout the   procedure, the patient's blood pressure, pulse, and                            oxygen saturations were monitored continuously. The                            EG-[id number] ([id number]) scope was introduced through the                            mouth, and advanced to the second part of duodenum.                            The upper GI endoscopy was accomplished without                            difficulty. The patient tolerated the procedure                            well. Scope In: Scope Out: Findings:      One benign-appearing, intrinsic severe (stenosis; an endoscope cannot       pass) stenosis was found at the GE junction. The stenosis was traversed       after dilation. A TTS dilator was passed through the scope. Dilation       with a 15-16.5-18 mm x 5.5 cm CRE balloon dilator was performed to 18       mm. The dilation site was examined following endoscope reinsertion and       showed complete resolution of luminal narrowing.      A benign-appearing, intrinsic severe stenosis was found at the pylorus.       A TTS dilator was passed through the scope. Dilation with a 16.5 mm       pyloric balloon dilator was performed. The dilation site was examined       following endoscope reinsertion and showed complete resolution of       luminal narrowing.      The cardia and gastric fundus were normal on retroflexion.      The examined duodenum was normal. Impression:               - Benign-appearing esophageal stenosis. Dilated.                           - Gastric stenosis was found at the pylorus.  Dilated.                           - Normal examined duodenum.                           - No specimens collected. Moderate Sedation:      Patient did not receive moderate sedation for this procedure, but       instead received monitored anesthesia care. Recommendation:           - Resume regular diet.                           - Continue present medications.                            - Repeat upper endoscopy in 2 months for                            retreatment. Procedure Code(s):        --- Professional ---                           [phone], Esophagogastroduodenoscopy, flexible,                            transoral; with dilation of gastric/duodenal                            stricture(s) (eg, balloon, bougie)                           43249, Esophagogastroduodenoscopy, flexible,                            transoral; with transendoscopic balloon dilation of                            esophagus (less than 30 mm diameter) Diagnosis Code(s):        --- Professional ---                           K22.2, Esophageal obstruction                           K31.1, Adult hypertrophic pyloric stenosis                           R13.10, Dysphagia, unspecified CPT copyright 2017 [employer] Association. All rights reserved. The codes documented in this report are preliminary and upon coder review may  be revised to meet current compliance requirements. Ronnette Juniper, MD 06/08/2017 9:00:50 AM This report has been signed electronically. Number of Addenda: 0

## 2017-06-08 NOTE — Interval H&P Note (Signed)
History and Physical Interval Note: 71/female with dysphagia, colitis, esophageal and duodenal stenosis for EGD with possible dilation and colonoscopy through stoma and proctoscopy of Hartman's pouch.  06/08/2017 7:56 AM  [name] Bell  has presented today for EGD and colonoscopy, with the diagnosis of nausea and vomiting and epigastric pain, abnormal CT scan  The various methods of treatment have been discussed with the patient and family. After consideration of risks, benefits and other options for treatment, the patient has consented to  Procedure(s) with comments: ESOPHAGOGASTRODUODENOSCOPY (EGD) WITH PROPOFOL (N/A) - with possible through-the-scope balloon dilatation of the esophagus and duodenum COLONOSCOPY WITH PROPOFOL (N/A) - through colostomy, also examination of Hartman's pouch as a surgical intervention .  The patient's history has been reviewed, patient examined, no change in status, stable for surgery.  I have reviewed the patient's chart and labs.  Questions were answered to the patient's satisfaction.     Ronnette Juniper

## 2017-06-08 NOTE — Anesthesia Procedure Notes (Signed)
Procedure Name: MAC Date/Time: 06/08/2017 8:04 AM Performed by: Lance Coon, CRNA Pre-anesthesia Checklist: Patient identified, Emergency Drugs available, Suction available, Patient being monitored and Timeout performed Patient Re-evaluated:Patient Re-evaluated prior to induction Oxygen Delivery Method: Nasal cannula

## 2017-06-08 NOTE — Brief Op Note (Signed)
06/03/2017 - 06/08/2017  9:05 AM  PATIENT:  Carron Brazen  72 y.o. female  PRE-OPERATIVE DIAGNOSIS:  nausea and vomiting and epigastric pain, abnormal CT scan  POST-OPERATIVE DIAGNOSIS:  EGD: pyloric stenosis, esophageal stricture, s/p balloon dilation Rectum: normal Ostomy to cecum: ascending colon polyp   PROCEDURE:  Procedure(s) with comments: ESOPHAGOGASTRODUODENOSCOPY (EGD) WITH PROPOFOL (N/A) - with possible through-the-scope balloon dilatation of the esophagus and duodenum COLONOSCOPY WITH PROPOFOL (N/A) - through colostomy, also examination of Hartman's pouch ESOPHAGEAL AND PYLORIC  BALLOON DILATION (N/A) BIOPSY RECTAL EXAM UNDER ANESTHESIA WITH SCOPE  SURGEON:  Surgeon(s) and Role:    Ronnette Juniper, MD - Primary  PHYSICIAN ASSISTANT:   ASSISTANTS: Burtis Junes, RN< Aneta Mins, Tech ANESTHESIA:   MAC  EBL:  5 mL   BLOOD ADMINISTERED:none  DRAINS: none   LOCAL MEDICATIONS USED:  NONE  SPECIMEN:  Biopsy / Limited Resection  DISPOSITION OF SPECIMEN:  PATHOLOGY  COUNTS:  YES  TOURNIQUET:  * No tourniquets in log *  DICTATION: .Dragon Dictation  PLAN OF CARE: Admit to inpatient   PATIENT DISPOSITION:  PACU - hemodynamically stable.   Delay start of Pharmacological VTE agent (>24hrs) due to surgical blood loss or risk of bleeding: no

## 2017-06-08 NOTE — Progress Notes (Signed)
Internal Medicine Attending  Date: 06/08/2017  Patient name: [name] Bell Medical record number: [id number] Date of birth: 1945/11/20 Age: 72 y.o. Gender: female  I saw and evaluated the patient. I reviewed the resident's note by Dr. Philipp Ovens and I agree with the resident's findings and plans as documented in her progress note.  When seen on rounds this morning [name] had already completed her endoscopies. It demonstrated a benign-appearing stenosis at the GE junction as well as a benign-appearing stenosis at the pyloric channel. Both of these were dilated via balloons. Her colonoscopy was notable for thickening of the mucosal folds of the ascending colon. Several biopsies were taken and are pending at the time of this dictation. Symptomatically she felt much improved since admission and was ready for discharge home. Follow-up will be in the [facility] with her primary care provider Dr. Johny Chess.

## 2017-06-08 NOTE — Progress Notes (Signed)
Patient was discharged home with home health by MD order; discharged instructions review and give to patient with care notes; IV DIC; patient will be transported at home via [facility].

## 2017-06-08 NOTE — Transfer of Care (Signed)
Immediate Anesthesia Transfer of Care Note  Patient: Amenda G Maclachlan  Procedure(s) Performed: ESOPHAGOGASTRODUODENOSCOPY (EGD) WITH PROPOFOL (N/A ) COLONOSCOPY WITH PROPOFOL (N/A )  Patient Location: Endoscopy Unit  Anesthesia Type:MAC  Level of Consciousness: awake and patient cooperative  Airway & Oxygen Therapy: Patient Spontanous Breathing and Patient connected to nasal cannula oxygen  Post-op Assessment: Report given to RN and Post -op Vital signs reviewed and stable  Post vital signs: Reviewed and stable  Last Vitals:  Vitals Value Taken Time  BP 129/62 06/08/2017  8:53 AM  Temp 37.1 C 06/08/2017  8:53 AM  Pulse 97 06/08/2017  8:54 AM  Resp 17 06/08/2017  8:54 AM  SpO2 100 % 06/08/2017  8:54 AM  Vitals shown include unvalidated device data.  Last Pain:  Vitals:   06/08/17 0853  TempSrc: Oral  PainSc: 0-No pain      Patients Stated Pain Goal: 2 (02/72/53 6644)  Complications: No apparent anesthesia complications

## 2017-06-08 NOTE — Progress Notes (Signed)
O2 sats 90% on room air on arrival to endoscopy preprocedure, patient reports need for albuterol nebulizer, this was given per MAR. Sats 100% on room air after treatment, patient reports improved breathing.

## 2017-06-08 NOTE — Anesthesia Postprocedure Evaluation (Signed)
Anesthesia Post Note  Patient: Claretha G Sedgwick  Procedure(s) Performed: ESOPHAGOGASTRODUODENOSCOPY (EGD) WITH PROPOFOL (N/A ) COLONOSCOPY WITH PROPOFOL (N/A ) ESOPHAGEAL AND PYLORIC  BALLOON DILATION (N/A ) BIOPSY RECTAL EXAM UNDER ANESTHESIA WITH SCOPE     Patient location during evaluation: PACU Anesthesia Type: MAC Level of consciousness: awake and alert Pain management: pain level controlled Vital Signs Assessment: post-procedure vital signs reviewed and stable Respiratory status: spontaneous breathing, nonlabored ventilation, respiratory function stable and patient connected to nasal cannula oxygen Cardiovascular status: stable and blood pressure returned to baseline Postop Assessment: no apparent nausea or vomiting Anesthetic complications: no    Last Vitals:  Vitals:   06/08/17 0900 06/08/17 0910  BP: (!) 136/45 (!) 139/42  Pulse: 99 93  Resp: (!) 23 (!) 21  Temp:    SpO2: 100% 100%    Last Pain:  Vitals:   06/08/17 0910  TempSrc:   PainSc: 0-No pain                 Lucious Zou,W. EDMOND

## 2017-06-08 NOTE — Progress Notes (Addendum)
4pm-Pt changed her mind and requested [facility]. Voucher shredded.   3:45pm-CSW received call stating PT[facility] was cancelled and patient requested taxi voucher. CSW provided voucher.     CSW received call requesting [facility] home.   Patient will DC to: Home Anticipated DC date: 06/08/17 Family notified: Family alerting family Transport by: [facility]   DC packet on chart. Ambulance transport requested for patient.   CSW signing off.  Cedric Fishman, LCSW Clinical Social Worker [phone]

## 2017-06-08 NOTE — Op Note (Signed)
[facility] Patient Name: [name] Bell Procedure Date : 06/08/2017 MRN: [id number] Attending MD: Ronnette Juniper , MD Date of Birth: 06-26-1945 CSN: [id number] Age: 72 Admit Type: Inpatient Procedure:                Colonoscopy Indications:              Last colonoscopy: 2009, Abnormal CT of the GI tract Providers:                Ronnette Juniper, MD, Burtis Junes, RN, Alan Mulder,                            Technician, Lance Coon, CRNA Referring MD:              Medicines:                 Complications:            No immediate complications. Estimated Blood Loss:     Estimated blood loss: none. Procedure:                Pre-Anesthesia Assessment:                           - Prior to the procedure, a History and Physical                            was performed, and patient medications and                            allergies were reviewed. The patient's tolerance of                            previous anesthesia was also reviewed. The risks                            and benefits of the procedure and the sedation                            options and risks were discussed with the patient.                            All questions were answered, and informed consent                            was obtained. Prior Anticoagulants: The patient has                            taken no previous anticoagulant or antiplatelet                            agents. ASA Grade Assessment: III - A patient with                            severe systemic disease. After reviewing the risks  and benefits, the patient was deemed in                            satisfactory condition to undergo the procedure.                           After obtaining informed consent, the colonoscope                            was passed under direct vision. Throughout the                            procedure, the patient's blood pressure, pulse, and                            oxygen  saturations were monitored continuously. The                            EC-[id number] ([id number]) scope was introduced through                            the anus up to the rectal stump(12 cm) and then                            through the stoma up to the the cecum, identified                            by appendiceal orifice and ileocecal valve. The                            colonoscopy was performed without difficulty. The                            patient tolerated the procedure well. The quality                            of the bowel preparation was good. Scope In: 8:33:36 AM Scope Out: 8:48:52 AM Scope Withdrawal Time: 0 hours 10 minutes 17 seconds  Total Procedure Duration: 0 hours 15 minutes 16 seconds  Findings:      The perianal and digital rectal examinations were normal.      The anus, rectum and recto-sigmoid colon appeared normal up to 12 cm       from insertion.      The transverse colon, hepatic flexure, cecum, appendiceal orifice and       ileocecal valve appeared normal.      A localized area of mildly congested and thickened folds of the mucosa       was found in the ascending colon. Biopsies were taken with a cold       forceps for histology. Impression:               - The anus, rectum and recto-sigmoid colon are                            normal(on  proctoscopy).                           - The transverse colon, hepatic flexure, cecum,                            appendiceal orifice and ileocecal valve are                            normal(scope advanced via the stoma).                           - Congested and thickened folds of the mucosa in                            the ascending colon. Biopsied. Recommendation:           - Resume regular diet.                           - Continue present medications.                           - Await pathology results.                           - Repeat colonoscopy in 3 - 5 years for                            surveillance based  on pathology results. Procedure Code(s):        --- Professional ---                           [id number], Colonoscopy, flexible; with biopsy, single                            or multiple Diagnosis Code(s):        --- Professional ---                           K63.89, Other specified diseases of intestine                           R93.3, Abnormal findings on diagnostic imaging of                            other parts of digestive tract CPT copyright 2017 American Medical Association. All rights reserved. The codes documented in this report are preliminary and upon coder review may  be revised to meet current compliance requirements. Ronnette Juniper, MD 06/08/2017 9:04:46 AM This report has been signed electronically. Number of Addenda: 0

## 2017-06-08 NOTE — Progress Notes (Signed)
   Subjective: Patient was seen after endoscopy this morning. Reports her pain is significantly improved after balloon dilation of her pyloric stenosis and esophageal stricture.   Objective:  Vital signs in last 24 hours: Vitals:   06/08/17 0728 06/08/17 0853 06/08/17 0900 06/08/17 0910  BP: (!) 131/43 129/62 (!) 136/45 (!) 139/42  Pulse:  95 99 93  Resp: 17 19 (!) 23 (!) 21  Temp: 98.8 F (37.1 C) 98.7 F (37.1 C)    TempSrc: Oral Oral    SpO2: 90% 100% 100% 100%  Weight: 138 lb (62.6 kg)     Height: 5\' 4"  (1.626 m)      Physical Exam Constitutional: NAD, appears comfortable Cardiovascular: RRR, no murmurs, rubs, or gallops.  Pulmonary/Chest: CTAB, no wheezes, rales, or rhonchi.  Abdominal: Soft, mildly tender around her ostomy site, distended due to chronic hernia, +BS  Extremities: Warm and well perfused. No edema.   Assessment/Plan:  Diffuse Colitis Dysphagia Hx of Colostomy secondary to perforated stercoral ulcer in 2013 Patient has now completed 4 days of IV abx for possible infectious colitis given CT findings on admission. GI panel was negative. Symptoms improved with supportive care. GI was consulted and patient underwent EGD, colonoscopy through her colostomy, as well as evaluation of Hartman's pouch today 5/14. Findings were significant for a benign appearing stenosis at the GE junction & pyloric channel, s/p balloon dilation for both. Colonoscopy through the colostomy appeared mildly thickened and multiple biopsies were taken. Evaluation of hartman's pouch appeared normal.  -- Advance diet as tolerated -- Continue daily PPI -- GI plans for outpatient endoscopy in 2 months for repeat dilation of esophogeal & pyloric stenosis  -- GI will follow up biopsies as an outpatient -- Plan to discharge if tolerating PO  Iron Deficiency Anemia: Possible malabsorption with GI history --Ferritin 6 -- S/p IV ferraheme yesterday, follow up outpatient   Hypotension: Resolved  with NS bolus yesterday   Chronic Low Back Pain (on chronic opioids) -- Continue home morphine 30mg  q12h -- Continue home Norco 7.5-325 q6h PRN -- Holding bowl regimen in the setting of increased ostomy out put, resume as needed  Rib pain Osteoporosis: On Alendronate (on fridays) and Vit D supplementation for osteoporosis. Reports rib pain with point tenderness on exam.Rib film with no evidence of acute fracture. -- Holding Alendronate given patients GI symptoms -- Continue home Vit D -- Pain control as above  GERD: On esomeprazole 40mg  BID at home -- Protonix 40mg  Daily  Hyperlipidemia -- Continue home pravastatin  COPD -- Allbuterol neb q6h PRN  Dispo: Anticipated discharge today pending patient is tolerating PO.  Velna Ochs, MD 06/08/2017, 12:56 PM Pager: [phone]

## 2017-06-08 NOTE — Progress Notes (Signed)
[facility] faxed resumption of Home Health PT orders and additional home health RN orders with face-to-face, face sheet to [facility].  Successful confirmation received.

## 2017-06-08 NOTE — Consult Note (Signed)
[facility] CM Primary Care Navigator  06/08/2017  [name] Bell [birth year] [id number]   Met with patientat the bedsideto identify possible discharge needs. Patient reportsthat she had "nausea, vomiting, diarrhea and abdominal pain" that had led to this admission. (colitis)  Patient endorsesDr. Tawny Asal with [place] asherprimary care provider.   [facility] [address] [facility] with issues of affordability with her medications (Fosamax and Inhaler for COPD- unable to recall name of inhaler).   Patient states that neighbor/ friend (Cookie J.) has been managing her medications at [employer] use of "pill box" filled every week.  Patient reports thatshe has been using taxi cab astransportation to herdoctors' appointments if needed. She also mentioned using [employer] transportation previously. Explained to patient regarding [employer]) transportation benefits.  She reports living alone at home but has an aide- Clearnce Hasten (through [place]) who provides assistance with her care needs everyday, 3 hours/ day.  Anticipated plan for discharge is home with home health services per patient, as recommended by therapist.  Patient voiced understanding to call primary care provider's office onceshe returns home, for a post discharge follow-up appointment within1- 2weeksor sooner if needed.Patient letter (with PCP's contact number) was provided as a reminder. She mentioned having an appointment previously scheduled for next week and will keep that appointment.  Explained to patient about [facility] CM services available for healthmanagement and resources. Patient verbalized interest to check options for medication assistance for Fosamax and inhaler for COPD which she specifically was having trouble affording. Patient states that Fosamax is supplied every other month instead of monthly and she had stopped  using the inhaler since February because she was unable to afford it.    Patient has history of ischemic colitis, stercoral ulcer with perforation status post sigmoid colectomy with colostomy in 2013, duodenal stricture, chronic low back pain on opiates, osteoporosis, GERD, hyperlipidemia, COPD and legal blindness.  Patient verbally agreedfor [facility] pharmacy to be assisted with medication issues of affordability and adherence.  Referralto [facility] pharmacywasmade formedication review, medication assistance (affordability) and explore other options to help afford medications after discharge.  Patientexpressed understandingto seekreferralfrom primary care provider to [facility] care management services ifdeemed necessaryand appropriatefor further assistance inthefuture.  [facility] care management contact information provided for future needs that may arise.   For additional questions please contact:  Edwena Felty A. Tevis Dunavan, BSN, RN-BC [facility] PRIMARY CARE Navigator Cell: [phone]

## 2017-06-09 ENCOUNTER — Other Ambulatory Visit: Payer: Self-pay | Admitting: Pharmacist

## 2017-06-09 NOTE — Patient Outreach (Addendum)
[place] [facility]) Care Management  06/09/2017  [name] Bell 1945/06/06 [id number]   Patient was called regarding medication assistance and transitional care. Unfortunately, she did not answer the phone and a HIPAA compliant message could not be left for her because the phone rang >30 times without a message system picking up.  Referral message:  Referralto [facility] pharmacy formedication review, medication assistance (affordability) for medication adherence and explore other options to help afford medications after discharge.   Diagnoses of       COPD/ Pneumonia   Expected contact 1-3 days (reserved for hospital discharges)   Referral Notes   Patient has history of ischemic colitis, stercoral ulcer with perforation, status post sigmoid colectomy with colostomy in 2013, duodenal stricture, chronic low back pain on opiates, osteoporosis, GERD, hyperlipidemia, COPD and legal blindness.    Patient verbalized interest to check options for medication assistance for Fosamax and inhaler for COPD (unable to recall name of inhaler) which she specifically was having trouble affording. Patient states that Fosamax is supplied every other month instead of monthly and she had stopped using the inhaler since February because she was unable to afford it.     Plan: Send patient an unsuccessful contact letter. Call patient back in 3 business days.  [name], PharmD, [place] Clinical Pharmacist [phone]

## 2017-06-10 ENCOUNTER — Encounter (HOSPITAL_COMMUNITY): Payer: Self-pay | Admitting: Gastroenterology

## 2017-06-11 ENCOUNTER — Other Ambulatory Visit: Payer: Self-pay | Admitting: Pharmacist

## 2017-06-11 ENCOUNTER — Ambulatory Visit: Payer: Self-pay | Admitting: Pharmacist

## 2017-06-11 NOTE — Patient Outreach (Signed)
[place] [facility]) Care Management  06/11/2017  [name] Bell [birth year] [id number]    Patient was called regarding medication assistance and transitional care. Unfortunately, she did not answer the phone and a HIPAA compliant message could not be left for her because the phone rang >30 times without a message system picking up.  Referral message:  Referralto [facility] pharmacy formedication review, medication assistance (affordability) for medication adherence and explore other options to help afford medications after discharge.   Diagnoses of       COPD/ Pneumonia   Expected contact 1-3 days (reserved for hospital discharges)   Referral Notes   Patient has history of ischemic colitis, stercoral ulcer with perforation, status post sigmoid colectomy with colostomy in 2013, duodenal stricture, chronic low back pain on opiates, osteoporosis, GERD, hyperlipidemia, COPD and legal blindness.    Patient verbalized interest to check options for medication assistance for Fosamax and inhaler for COPD (unable to recall name of inhaler) which she specifically was having trouble affording. Patient states that Fosamax is supplied every other month instead of monthly and she had stopped using the inhaler since February because she was unable to afford it.     Plan: Call patient back in 3-5 business days.  [name], PharmD, [place] Clinical Pharmacist [phone]

## 2017-06-16 ENCOUNTER — Ambulatory Visit: Payer: Medicare Other

## 2017-06-16 ENCOUNTER — Ambulatory Visit (INDEPENDENT_AMBULATORY_CARE_PROVIDER_SITE_OTHER): Payer: Medicare Other | Admitting: Internal Medicine

## 2017-06-16 ENCOUNTER — Other Ambulatory Visit: Payer: Self-pay

## 2017-06-16 VITALS — BP 126/50 | HR 97 | Temp 98.0°F | Ht 64.0 in | Wt 139.4 lb

## 2017-06-16 DIAGNOSIS — D509 Iron deficiency anemia, unspecified: Secondary | ICD-10-CM

## 2017-06-16 DIAGNOSIS — K9409 Other complications of colostomy: Secondary | ICD-10-CM

## 2017-06-16 DIAGNOSIS — Z79899 Other long term (current) drug therapy: Secondary | ICD-10-CM

## 2017-06-16 DIAGNOSIS — Z8719 Personal history of other diseases of the digestive system: Secondary | ICD-10-CM | POA: Diagnosis not present

## 2017-06-16 DIAGNOSIS — K529 Noninfective gastroenteritis and colitis, unspecified: Secondary | ICD-10-CM

## 2017-06-16 DIAGNOSIS — D508 Other iron deficiency anemias: Secondary | ICD-10-CM

## 2017-06-16 DIAGNOSIS — Z87891 Personal history of nicotine dependence: Secondary | ICD-10-CM | POA: Diagnosis not present

## 2017-06-16 MED ORDER — FERROUS GLUCONATE 324 (38 FE) MG PO TABS: 324.0000 mg | ORAL_TABLET | Freq: Every day | ORAL | 0 refills | Status: DC

## 2017-06-16 NOTE — Progress Notes (Signed)
CC: hospital follow up, Iron deficiency anemia, Colitis  HPI:  [name] Bell is a 72 y.o. female with PMH below.   She is here to follow up on how she has been doing with her colitis and to address her newly diagnosed Iron deficiency anemia.    Please see A&P for status of the patient's chronic medical conditions  Past Medical History:  Diagnosis Date  . Acute sinusitis 06/30/2011  . Anxiety   . Basal cell carcinoma    "left cheek"  . Bleeding ulcer   . Blind in both eyes   . Chronic back pain   . Degenerative disk disease    This is interscapular, mild compression frx of T12 superior endplate with Schmorl's node. Seen on CT in 2/08  . Depression   . Duodenal ulcer 11/08   With hemorrhage and obstruction  . History of blood transfusion    "related to bleeding from intestines and vomiting blood"  . Ischemic colitis ([place]) 07/30/2011   2009 by endoscopy   . Macular degeneration, wet ([place])   . Osteomyelitis, jaw acute 11/08   Started while in the hospital abcess showed GNR . SP debridement by Dr. Lilli Few,  Priscella Mann S Bovis sp 4  weeks of Pen V started on 05/19/07  with additional 2 weeks in 07/04/07  . Perforated bowel [facility])    surgery july 2013  . Superior mesenteric artery syndrome ([place]) 11/08   sp dilation by Dr. Watt Climes, Pt may require bowel resetion if sx recur   Review of Systems:  ROS: Pulmonary: pt denies increased work of breathing, shortness of breath,  Cardiac: pt denies palpitations, chest pain,  Abdominal: pt endorses continued abdominal pain, no nausea, vomiting, some increased on colostomy output.  Physical Exam:  Vitals:   06/16/17 1527 06/16/17 1612  BP: (!) 89/40 (!) 126/50  Pulse: 99 97  Temp: 98 F (36.7 C)   TempSrc: Oral   SpO2: 96%   Weight: 139 lb 6.4 oz (63.2 kg)   Height: 5\' 4"  (1.626 m)    Physical Exam  Constitutional: She is oriented to person, place, and time. No distress.  Cardiovascular: Normal rate, regular rhythm and normal heart  sounds. Exam reveals no gallop and no friction rub.  No murmur heard. Pulmonary/Chest: Effort normal and breath sounds normal. No respiratory distress. She has no wheezes. She has no rales. She exhibits no tenderness.  Abdominal: Soft. Bowel sounds are normal. She exhibits no distension and no mass. There is tenderness. There is no rebound and no guarding.  There is an ostomy bag present that is empty, a visible stoma with herniation is present as seen previously, it is patent, there are no signs of strangulation present.    Neurological: She is alert and oriented to person, place, and time.  Skin: She is not diaphoretic.    Social History   Socioeconomic History  . Marital status: Divorced    Spouse name: Not on file  . Number of children: Not on file  . Years of education: Not on file  . Highest education level: Not on file  Occupational History  . Not on file  Social Needs  . Financial resource strain: Not on file  . Food insecurity:    Worry: Not on file    Inability: Not on file  . Transportation needs:    Medical: Not on file    Non-medical: Not on file  Tobacco Use  . Smoking status: Former Smoker  Packs/day: 1.50    Years: 59.00    Pack years: 88.50    Types: Cigarettes    Last attempt to quit: 05/30/2014    Years since quitting: 3.0  . Smokeless tobacco: Never Used  Substance and Sexual Activity  . Alcohol use: No    Alcohol/week: 0.0 oz  . Drug use: No  . Sexual activity: Never  Lifestyle  . Physical activity:    Days per week: Not on file    Minutes per session: Not on file  . Stress: Not on file  Relationships  . Social connections:    Talks on phone: Not on file    Gets together: Not on file    Attends religious service: Not on file    Active member of club or organization: Not on file    Attends meetings of clubs or organizations: Not on file    Relationship status: Not on file  . Intimate partner violence:    Fear of current or ex partner: Not on  file    Emotionally abused: Not on file    Physically abused: Not on file    Forced sexual activity: Not on file  Other Topics Concern  . Not on file  Social History Narrative   Pt now [facility] by herself in an [facility] where she can come and go as she pleases.  A good friend named Truman Hayward still looks out for her daily.     Family History  Problem Relation Age of Onset  . Cancer Mother        Lung  . Heart disease Father   . Diabetes Father   . Diabetes Sister     Assessment & Plan:   See Encounters Tab for problem based charting.  Patient discussed with Dr. Dareen Piano

## 2017-06-16 NOTE — Patient Instructions (Addendum)
[name] Bell, I have written you a prescription for iron.  Please begin taking this daily.  Your initial blood pressure was low today.  If possible try and take less of your pain medications as this can lower your blood pressure.

## 2017-06-17 ENCOUNTER — Other Ambulatory Visit: Payer: Self-pay | Admitting: Pharmacist

## 2017-06-17 ENCOUNTER — Ambulatory Visit: Payer: Self-pay | Admitting: Pharmacist

## 2017-06-17 NOTE — Patient Outreach (Signed)
[place] [facility]) Care Management  06/17/2017  [name] Bell [birth year] [id number]   Called patient for medication review and assistance. Unfortunately, as soon as I called the patient, she asked that I call her back later because her nurse was at her home.  Plan: Call patient back in 1 business day.   [name], PharmD, [place] Clinical Pharmacist [phone]

## 2017-06-17 NOTE — Assessment & Plan Note (Signed)
She is here to follow up on her colitis.  She is up to 5 or 6 colostomy emptying per day as opposed to 3 times per day prior.  Still having abdominal pain and pressure with mild improvement since she was hospitalized.  Has chronically low bp.  June 20 has GI appointment [facility].  No vomiting since discharge and no trouble swallowing.  Pt able to keep down plenty of fluids and does not appear dehydrated on exam, vitals stable around her baseline.    -Pt does not wish to pursue even low dose antidiarrheal therapy due to history of constipation.   -Told pt that iron may decrease frequency somewhat

## 2017-06-17 NOTE — Assessment & Plan Note (Signed)
Pt diagnosed with iron deficiency anemia during hospital stay.  Endoscopy and colonoscopy performed while inpatient no active bleeding requiring cauterization.  Could be lack of absorption has had disease in ileum in the past.  Was given one dose of IV iron while inpatient.    -will start pt on ferrous gluconate 324 daily -recheck in 2 months if levels normalize continue one additional month then discontinue and monitor -if little to no improvement may require IV iron due to absorption deficit

## 2017-06-18 ENCOUNTER — Other Ambulatory Visit: Payer: Self-pay | Admitting: Pharmacist

## 2017-06-18 NOTE — Patient Outreach (Signed)
[place] [facility]) Care Management  06/18/2017  [name] Bell [birth year] [id number]   Patient was called again for medication review and assistance. Unfortuantely, as soon as I was able to confirm her HIPAA identifiers, she said she could not talk because she was on her way to the grocery store and asked me to call her back.  Plan: Call patient back in 5-7 business days.   [name], PharmD, [place] Clinical Pharmacist [phone]

## 2017-06-18 NOTE — Progress Notes (Signed)
Internal Medicine Clinic Attending  Case discussed with Dr. Winfrey  at the time of the visit.  We reviewed the resident's history and exam and pertinent patient test results.  I agree with the assessment, diagnosis, and plan of care documented in the resident's note.  

## 2017-06-23 DIAGNOSIS — J4520 Mild intermittent asthma, uncomplicated: Secondary | ICD-10-CM | POA: Diagnosis not present

## 2017-06-23 DIAGNOSIS — H548 Legal blindness, as defined in USA: Secondary | ICD-10-CM | POA: Diagnosis not present

## 2017-06-23 DIAGNOSIS — I10 Essential (primary) hypertension: Secondary | ICD-10-CM | POA: Diagnosis not present

## 2017-06-23 DIAGNOSIS — K529 Noninfective gastroenteritis and colitis, unspecified: Secondary | ICD-10-CM | POA: Diagnosis not present

## 2017-06-24 DIAGNOSIS — J4520 Mild intermittent asthma, uncomplicated: Secondary | ICD-10-CM | POA: Diagnosis not present

## 2017-06-24 DIAGNOSIS — H548 Legal blindness, as defined in USA: Secondary | ICD-10-CM | POA: Diagnosis not present

## 2017-06-24 DIAGNOSIS — I10 Essential (primary) hypertension: Secondary | ICD-10-CM | POA: Diagnosis not present

## 2017-06-24 DIAGNOSIS — K529 Noninfective gastroenteritis and colitis, unspecified: Secondary | ICD-10-CM | POA: Diagnosis not present

## 2017-06-29 ENCOUNTER — Other Ambulatory Visit: Payer: Self-pay | Admitting: Pharmacist

## 2017-06-29 ENCOUNTER — Ambulatory Visit: Payer: Self-pay | Admitting: Pharmacist

## 2017-06-29 DIAGNOSIS — J4520 Mild intermittent asthma, uncomplicated: Secondary | ICD-10-CM | POA: Diagnosis not present

## 2017-06-29 DIAGNOSIS — H548 Legal blindness, as defined in USA: Secondary | ICD-10-CM | POA: Diagnosis not present

## 2017-06-29 DIAGNOSIS — I10 Essential (primary) hypertension: Secondary | ICD-10-CM | POA: Diagnosis not present

## 2017-06-29 DIAGNOSIS — K529 Noninfective gastroenteritis and colitis, unspecified: Secondary | ICD-10-CM | POA: Diagnosis not present

## 2017-06-29 NOTE — Patient Outreach (Signed)
[place] [facility]) Care Management  06/29/2017  [name] Bell Oct 22, 1945 [id number]   Patient's case is being closed due to inability to establish and maintain contact.  Plan: Send letter to the patient and her PCP.   [name], PharmD, [place] Clinical Pharmacist [phone]

## 2017-07-01 ENCOUNTER — Ambulatory Visit (INDEPENDENT_AMBULATORY_CARE_PROVIDER_SITE_OTHER): Payer: Medicare Other | Admitting: Internal Medicine

## 2017-07-01 ENCOUNTER — Other Ambulatory Visit: Payer: Self-pay

## 2017-07-01 DIAGNOSIS — Z79899 Other long term (current) drug therapy: Secondary | ICD-10-CM

## 2017-07-01 DIAGNOSIS — K529 Noninfective gastroenteritis and colitis, unspecified: Secondary | ICD-10-CM

## 2017-07-01 DIAGNOSIS — K469 Unspecified abdominal hernia without obstruction or gangrene: Secondary | ICD-10-CM | POA: Diagnosis not present

## 2017-07-01 DIAGNOSIS — Z79891 Long term (current) use of opiate analgesic: Secondary | ICD-10-CM | POA: Diagnosis not present

## 2017-07-01 DIAGNOSIS — H548 Legal blindness, as defined in USA: Secondary | ICD-10-CM

## 2017-07-01 DIAGNOSIS — J441 Chronic obstructive pulmonary disease with (acute) exacerbation: Secondary | ICD-10-CM | POA: Diagnosis not present

## 2017-07-01 DIAGNOSIS — Z933 Colostomy status: Secondary | ICD-10-CM | POA: Diagnosis not present

## 2017-07-01 MED ORDER — AZITHROMYCIN 250 MG PO TABS: ORAL_TABLET | ORAL | 0 refills | Status: DC

## 2017-07-01 MED ORDER — ALBUTEROL SULFATE (2.5 MG/3ML) 0.083% IN NEBU: 2.5000 mg | INHALATION_SOLUTION | Freq: Four times a day (QID) | RESPIRATORY_TRACT | 12 refills | Status: DC | PRN

## 2017-07-01 MED ORDER — PREDNISONE 20 MG PO TABS: 40.0000 mg | ORAL_TABLET | Freq: Every day | ORAL | 0 refills | Status: AC

## 2017-07-01 NOTE — Progress Notes (Signed)
   CC: cough, abdominal pain and diarrhea  HPI:  [name] is a 72 y.o. female with past medical history as documented below presenting with chief complaint of cough and abdominal pain and diarrhea. Please see encounter based charting for a more detailed description of the patient's acute and chronic medical problems.   Past Medical History:  Diagnosis Date  . Acute sinusitis 06/30/2011  . Anxiety   . Basal cell carcinoma    "left cheek"  . Bleeding ulcer   . Blind in both eyes   . Chronic back pain   . Degenerative disk disease    This is interscapular, mild compression frx of T12 superior endplate with Schmorl's node. Seen on CT in 2/08  . Depression   . Duodenal ulcer 11/08   With hemorrhage and obstruction  . History of blood transfusion    "related to bleeding from intestines and vomiting blood"  . Ischemic colitis ([place]) 07/30/2011   2009 by endoscopy   . Macular degeneration, wet ([place])   . Osteomyelitis, jaw acute 11/08   Started while in the hospital abcess showed GNR . SP debridement by [name] Bell,  [name] Bell sp 4  weeks of Pen V started on 05/19/07  with additional 2 weeks in 07/04/07  . Perforated bowel [facility])    surgery july 2013  . Superior mesenteric artery syndrome ([place]) 11/08   sp dilation by Dr. Watt Climes, Pt may require bowel resetion if sx recur   Review of Systems:   Review of Systems  Constitutional: Positive for weight loss. Negative for chills and fever.  Respiratory: Positive for cough, sputum production and shortness of breath. Negative for hemoptysis and wheezing.   Cardiovascular: Negative for chest pain.  Gastrointestinal: Positive for abdominal pain and diarrhea. Negative for blood in stool, nausea and vomiting.  Genitourinary: Negative.     Physical Exam:  Vitals:   07/01/17 1519  BP: (!) 110/51  Pulse: (!) 118  Temp: 98.2 F (36.8 C)  TempSrc: Oral  SpO2: 97%  Weight: 131 lb 11.2 oz (59.7 kg)  Height: 5\' 4"  (1.626 m)   Physical  Exam  Constitutional: She is oriented to person, place, and time.  Thin, chronically ill appearing female  HENT:  Head: Normocephalic and atraumatic.  Eyes: Conjunctivae are normal. No scleral icterus.  Cardiovascular: Normal rate and regular rhythm.  Pulmonary/Chest: Effort normal. No accessory muscle usage. No respiratory distress.  Lungs CTAB, prolonged expiratory phase. No wheezing, rhonci, or rales.   Abdominal: Soft. Bowel sounds are normal. There is tenderness. A hernia is present.  Colostomy bag and ostomy in LLQ, no surrounding erythema or drainage. Large abdominal hernia (chronic). Soft, Diffusely TTP, normoactive bowel sounds.   Musculoskeletal: Normal range of motion. She exhibits no edema.  Neurological: She is alert and oriented to person, place, and time. No cranial nerve deficit.  Skin: Skin is warm and dry.  Psychiatric: She has a normal mood and affect.    Assessment & Plan:   See Encounters Tab for problem based charting.  Patient discussed with Dr. Rebeca Alert

## 2017-07-01 NOTE — Patient Instructions (Signed)
[name] Bell,   It was nice seeing you today! I am sorry you are not feeling well.   I have prescribed you Azithromycin and Prednisone to take for a COPD exacerbation. Continue to use your albuterol inhaler as needed.  Stop taking the Miralax and Colace for 2 days and see how you feel. If you think you are getting constipated start taking them again.   You have a follow up appointment with Dr. Johny Chess on June 11.

## 2017-07-01 NOTE — Assessment & Plan Note (Signed)
Patient presenting with SOB, cough, and chest tightness for 1 day duration. She states her cough is productive of green sputum. Patient denies dyspnea at rest, but is experiencing dyspnea on exertion. Symptoms are somewhat relieved by her albuterol inhaler. She is also complaining of runny nose and post nasal drip. Saturating high 90s on RA, tachycardic to 118. In no respiratory distress, lung exam CTAB, with prolonged expiratory phase, no wheezes or rhonci appreciated.   Patient's presentation concerning for early COPD exacerbation in setting upper respiratory tract infection. Will treat for COPD exacerbation.  Plan: -Prednisone 40 mg x 5 days -Azithromycin  -Refilled albuterol nebs - instructed patient to use q6 hours for the next several days -f/u with PCP on 6/11 to evaluate symptoms

## 2017-07-01 NOTE — Progress Notes (Signed)
Internal Medicine Clinic Attending  Case discussed with Dr. LaCroce  at the time of the visit.  We reviewed the resident's history and exam and pertinent patient test results.  I agree with the assessment, diagnosis, and plan of care documented in the resident's note.  Alexander N Raines, MD   

## 2017-07-01 NOTE — Assessment & Plan Note (Addendum)
Patient endorsing abdominal pain, cramping, and increased output from her colostomy bag. She states for the past 2 months she has been experiencing severe diffuse abdominal pain and diarrhea. Patient is legally blind, but states her health aids have not seen blood in her stool. She has to change her colostomy bag 6-7 times daily. Denies nausea or vomiting. Has been able to drink plenty of fluids. She has had a 7 pound weight loss over several weeks duration. She continues to take her colace and Miralax. She has a follow up appointment with GI,  Dr. Watt Climes on 6/20. Patient also requesting pain medications. She has run out of her morphine because she was taking additional doses for her abdominal pain and they cannot be refilled until 6/9.   Unclear etiology of patient's colitis. Biopsy of colon was unremarkable. Duration of symptoms seem more consistent with inflammatory process rather than infectious process, but biopsy was negative. She continues to take her stool softeners, this may contributing. Patient may benefit from further work up if symptoms continue when stool softeners have been held/scaled back.   Plan: -Hold colace and Miralax for several days and see if helps -Prednisone 40 mg x 5 days may help if inflammatory process -GI follow up -Stool studies (ova, parasites, etc) if continues

## 2017-07-02 ENCOUNTER — Other Ambulatory Visit: Payer: Self-pay

## 2017-07-02 ENCOUNTER — Observation Stay (HOSPITAL_COMMUNITY)
Admission: EM | Admit: 2017-07-02 | Discharge: 2017-07-06 | Disposition: A | Discharge status: Home Health | Payer: Medicare Other | Attending: Oncology | Admitting: Oncology

## 2017-07-02 ENCOUNTER — Emergency Department (HOSPITAL_COMMUNITY): Payer: Medicare Other

## 2017-07-02 ENCOUNTER — Encounter (HOSPITAL_COMMUNITY): Payer: Self-pay | Admitting: Emergency Medicine

## 2017-07-02 DIAGNOSIS — K760 Fatty (change of) liver, not elsewhere classified: Secondary | ICD-10-CM | POA: Insufficient documentation

## 2017-07-02 DIAGNOSIS — G894 Chronic pain syndrome: Secondary | ICD-10-CM | POA: Insufficient documentation

## 2017-07-02 DIAGNOSIS — F419 Anxiety disorder, unspecified: Secondary | ICD-10-CM | POA: Insufficient documentation

## 2017-07-02 DIAGNOSIS — K6289 Other specified diseases of anus and rectum: Secondary | ICD-10-CM | POA: Diagnosis not present

## 2017-07-02 DIAGNOSIS — H548 Legal blindness, as defined in USA: Secondary | ICD-10-CM | POA: Insufficient documentation

## 2017-07-02 DIAGNOSIS — Z85828 Personal history of other malignant neoplasm of skin: Secondary | ICD-10-CM | POA: Diagnosis not present

## 2017-07-02 DIAGNOSIS — R1111 Vomiting without nausea: Secondary | ICD-10-CM | POA: Diagnosis not present

## 2017-07-02 DIAGNOSIS — R05 Cough: Secondary | ICD-10-CM | POA: Diagnosis not present

## 2017-07-02 DIAGNOSIS — Z9049 Acquired absence of other specified parts of digestive tract: Secondary | ICD-10-CM | POA: Insufficient documentation

## 2017-07-02 DIAGNOSIS — F329 Major depressive disorder, single episode, unspecified: Secondary | ICD-10-CM | POA: Diagnosis not present

## 2017-07-02 DIAGNOSIS — Z933 Colostomy status: Secondary | ICD-10-CM | POA: Insufficient documentation

## 2017-07-02 DIAGNOSIS — M810 Age-related osteoporosis without current pathological fracture: Secondary | ICD-10-CM | POA: Diagnosis not present

## 2017-07-02 DIAGNOSIS — Z7952 Long term (current) use of systemic steroids: Secondary | ICD-10-CM | POA: Insufficient documentation

## 2017-07-02 DIAGNOSIS — Z9841 Cataract extraction status, right eye: Secondary | ICD-10-CM | POA: Insufficient documentation

## 2017-07-02 DIAGNOSIS — Z961 Presence of intraocular lens: Secondary | ICD-10-CM | POA: Insufficient documentation

## 2017-07-02 DIAGNOSIS — Z91040 Latex allergy status: Secondary | ICD-10-CM | POA: Insufficient documentation

## 2017-07-02 DIAGNOSIS — Z9842 Cataract extraction status, left eye: Secondary | ICD-10-CM | POA: Diagnosis not present

## 2017-07-02 DIAGNOSIS — Z87891 Personal history of nicotine dependence: Secondary | ICD-10-CM | POA: Insufficient documentation

## 2017-07-02 DIAGNOSIS — R000 Tachycardia, unspecified: Secondary | ICD-10-CM | POA: Diagnosis not present

## 2017-07-02 DIAGNOSIS — D509 Iron deficiency anemia, unspecified: Secondary | ICD-10-CM | POA: Diagnosis not present

## 2017-07-02 DIAGNOSIS — Z90710 Acquired absence of both cervix and uterus: Secondary | ICD-10-CM | POA: Insufficient documentation

## 2017-07-02 DIAGNOSIS — Z79891 Long term (current) use of opiate analgesic: Secondary | ICD-10-CM | POA: Insufficient documentation

## 2017-07-02 DIAGNOSIS — M4854XA Collapsed vertebra, not elsewhere classified, thoracic region, initial encounter for fracture: Secondary | ICD-10-CM | POA: Diagnosis not present

## 2017-07-02 DIAGNOSIS — R110 Nausea: Secondary | ICD-10-CM | POA: Diagnosis not present

## 2017-07-02 DIAGNOSIS — I700 Atherosclerosis of aorta: Secondary | ICD-10-CM | POA: Insufficient documentation

## 2017-07-02 DIAGNOSIS — Z886 Allergy status to analgesic agent status: Secondary | ICD-10-CM | POA: Insufficient documentation

## 2017-07-02 DIAGNOSIS — Z79899 Other long term (current) drug therapy: Secondary | ICD-10-CM | POA: Insufficient documentation

## 2017-07-02 DIAGNOSIS — Z7951 Long term (current) use of inhaled steroids: Secondary | ICD-10-CM | POA: Diagnosis not present

## 2017-07-02 DIAGNOSIS — E785 Hyperlipidemia, unspecified: Secondary | ICD-10-CM | POA: Diagnosis not present

## 2017-07-02 DIAGNOSIS — A0472 Enterocolitis due to Clostridium difficile, not specified as recurrent: Principal | ICD-10-CM | POA: Insufficient documentation

## 2017-07-02 DIAGNOSIS — R197 Diarrhea, unspecified: Secondary | ICD-10-CM | POA: Diagnosis not present

## 2017-07-02 DIAGNOSIS — I959 Hypotension, unspecified: Secondary | ICD-10-CM | POA: Diagnosis not present

## 2017-07-02 DIAGNOSIS — R112 Nausea with vomiting, unspecified: Secondary | ICD-10-CM | POA: Diagnosis not present

## 2017-07-02 DIAGNOSIS — K529 Noninfective gastroenteritis and colitis, unspecified: Secondary | ICD-10-CM | POA: Diagnosis present

## 2017-07-02 DIAGNOSIS — J449 Chronic obstructive pulmonary disease, unspecified: Secondary | ICD-10-CM | POA: Diagnosis not present

## 2017-07-02 DIAGNOSIS — R109 Unspecified abdominal pain: Secondary | ICD-10-CM | POA: Diagnosis not present

## 2017-07-02 LAB — CBC WITH DIFFERENTIAL/PLATELET
Abs Immature Granulocytes: 0 10*3/uL (ref 0.0–0.1)
Basophils Absolute: 0 10*3/uL (ref 0.0–0.1)
Basophils Relative: 0 %
EOS ABS: 0 10*3/uL (ref 0.0–0.7)
Eosinophils Relative: 0 %
HEMATOCRIT: 42 % (ref 36.0–46.0)
HEMOGLOBIN: 12.7 g/dL (ref 12.0–15.0)
Immature Granulocytes: 1 %
LYMPHS ABS: 0.6 10*3/uL — AB (ref 0.7–4.0)
LYMPHS PCT: 9 %
MCH: 26.3 pg (ref 26.0–34.0)
MCHC: 30.2 g/dL (ref 30.0–36.0)
MCV: 87 fL (ref 78.0–100.0)
MONOS PCT: 1 %
Monocytes Absolute: 0 10*3/uL — ABNORMAL LOW (ref 0.1–1.0)
Neutro Abs: 5.9 10*3/uL (ref 1.7–7.7)
Neutrophils Relative %: 89 %
Platelets: 272 10*3/uL (ref 150–400)
RBC: 4.83 MIL/uL (ref 3.87–5.11)
RDW: 26.3 % — AB (ref 11.5–15.5)
WBC: 6.6 10*3/uL (ref 4.0–10.5)

## 2017-07-02 LAB — COMPREHENSIVE METABOLIC PANEL
ALBUMIN: 2.4 g/dL — AB (ref 3.5–5.0)
ALT: 17 U/L (ref 14–54)
AST: 63 U/L — AB (ref 15–41)
Alkaline Phosphatase: 140 U/L — ABNORMAL HIGH (ref 38–126)
Anion gap: 16 — ABNORMAL HIGH (ref 5–15)
CHLORIDE: 107 mmol/L (ref 101–111)
CO2: 21 mmol/L — AB (ref 22–32)
CREATININE: 0.91 mg/dL (ref 0.44–1.00)
Calcium: 9 mg/dL (ref 8.9–10.3)
GFR calc Af Amer: >60 mL/min (ref >60)
GFR calc non Af Amer: >60 mL/min (ref >60)
Glucose, Bld: 130 mg/dL — ABNORMAL HIGH (ref 65–99)
POTASSIUM: 3.7 mmol/L (ref 3.5–5.1)
SODIUM: 144 mmol/L (ref 135–145)
Total Bilirubin: 0.5 mg/dL (ref 0.3–1.2)
Total Protein: 5.4 g/dL — ABNORMAL LOW (ref 6.5–8.1)

## 2017-07-02 LAB — URINALYSIS, ROUTINE W REFLEX MICROSCOPIC
BILIRUBIN URINE: NEGATIVE
Glucose, UA: NEGATIVE mg/dL
KETONES UR: NEGATIVE mg/dL
NITRITE: NEGATIVE
PH: 5 (ref 5.0–8.0)
Protein, ur: NEGATIVE mg/dL
Specific Gravity, Urine: 1.003 — ABNORMAL LOW (ref 1.005–1.030)

## 2017-07-02 LAB — I-STAT CG4 LACTIC ACID, ED
LACTIC ACID, VENOUS: 7.12 mmol/L — AB (ref 0.5–1.9)
Lactic Acid, Venous: 4.28 mmol/L (ref 0.5–1.9)
Lactic Acid, Venous: 1.49 mmol/L (ref 0.5–1.9)

## 2017-07-02 LAB — PROTIME-INR
INR: 0.97
PROTHROMBIN TIME: 12.8 s (ref 11.4–15.2)

## 2017-07-02 MED ORDER — PRAVASTATIN SODIUM 40 MG PO TABS
40.0000 mg | ORAL_TABLET | Freq: Every day | ORAL | Status: DC
  Administered 2017-07-03 – 2017-07-06 (×4): 40 mg via ORAL
  Filled 2017-07-02 (×4): qty 1

## 2017-07-02 MED ORDER — PIPERACILLIN-TAZOBACTAM 3.375 G IVPB 30 MIN
3.3750 g | Freq: Once | INTRAVENOUS | Status: AC
Start: 2017-07-02 — End: 2017-07-02
  Administered 2017-07-02: 3.375 g via INTRAVENOUS
  Filled 2017-07-02: qty 50

## 2017-07-02 MED ORDER — ONDANSETRON HCL 4 MG PO TABS
4.0000 mg | ORAL_TABLET | Freq: Three times a day (TID) | ORAL | Status: DC | PRN
  Administered 2017-07-02: 4 mg via ORAL
  Filled 2017-07-02 (×2): qty 1

## 2017-07-02 MED ORDER — VANCOMYCIN HCL IN DEXTROSE 1-5 GM/200ML-% IV SOLN: 1000.0000 mg | Freq: Once | INTRAVENOUS | Status: DC

## 2017-07-02 MED ORDER — IOPAMIDOL (ISOVUE-370) INJECTION 76%
INTRAVENOUS | Status: AC
  Filled 2017-07-02: qty 100

## 2017-07-02 MED ORDER — PIPERACILLIN-TAZOBACTAM 3.375 G IVPB: 3.3750 g | Freq: Three times a day (TID) | INTRAVENOUS | Status: DC

## 2017-07-02 MED ORDER — FLUTICASONE PROPIONATE 50 MCG/ACT NA SUSP
2.0000 | Freq: Every day | NASAL | Status: DC
  Administered 2017-07-03 – 2017-07-06 (×4): 2 via NASAL
  Filled 2017-07-02: qty 16

## 2017-07-02 MED ORDER — VANCOMYCIN HCL 500 MG IV SOLR
500.0000 mg | Freq: Two times a day (BID) | INTRAVENOUS | Status: DC
  Filled 2017-07-02: qty 500

## 2017-07-02 MED ORDER — SODIUM CHLORIDE 0.9 % IV BOLUS (SEPSIS)
1000.0000 mL | Freq: Once | INTRAVENOUS | Status: AC
  Administered 2017-07-02: 1000 mL via INTRAVENOUS

## 2017-07-02 MED ORDER — VANCOMYCIN HCL 10 G IV SOLR
1250.0000 mg | Freq: Once | INTRAVENOUS | Status: AC
  Administered 2017-07-02: 1250 mg via INTRAVENOUS
  Filled 2017-07-02: qty 1250

## 2017-07-02 MED ORDER — SODIUM CHLORIDE 0.9 % IV BOLUS (SEPSIS): 1000.0000 mL | Freq: Once | INTRAVENOUS | Status: DC

## 2017-07-02 MED ORDER — SODIUM CHLORIDE 0.9 % IV BOLUS
1000.0000 mL | Freq: Once | INTRAVENOUS | Status: DC
  Administered 2017-07-02: 1000 mL via INTRAVENOUS

## 2017-07-02 MED ORDER — PANTOPRAZOLE SODIUM 40 MG PO TBEC
40.0000 mg | DELAYED_RELEASE_TABLET | Freq: Every day | ORAL | Status: DC
  Administered 2017-07-03 – 2017-07-06 (×4): 40 mg via ORAL
  Filled 2017-07-02 (×4): qty 1

## 2017-07-02 MED ORDER — ACETAMINOPHEN 650 MG RE SUPP: 650.0000 mg | Freq: Four times a day (QID) | RECTAL | Status: DC | PRN

## 2017-07-02 MED ORDER — MORPHINE SULFATE (PF) 4 MG/ML IV SOLN
2.0000 mg | Freq: Once | INTRAVENOUS | Status: AC
  Administered 2017-07-02: 2 mg via INTRAVENOUS
  Filled 2017-07-02: qty 1

## 2017-07-02 MED ORDER — ONDANSETRON HCL 4 MG/2ML IJ SOLN
4.0000 mg | Freq: Once | INTRAMUSCULAR | Status: AC
  Administered 2017-07-02: 4 mg via INTRAVENOUS
  Filled 2017-07-02: qty 2

## 2017-07-02 MED ORDER — ACETAMINOPHEN 325 MG PO TABS: 650.0000 mg | ORAL_TABLET | Freq: Four times a day (QID) | ORAL | Status: DC | PRN

## 2017-07-02 MED ORDER — ALBUTEROL SULFATE (2.5 MG/3ML) 0.083% IN NEBU: 2.5000 mg | INHALATION_SOLUTION | RESPIRATORY_TRACT | Status: DC | PRN

## 2017-07-02 MED ORDER — PREDNISONE 20 MG PO TABS
40.0000 mg | ORAL_TABLET | Freq: Every day | ORAL | Status: DC
  Administered 2017-07-02: 40 mg via ORAL
  Filled 2017-07-02 (×2): qty 2

## 2017-07-02 MED ORDER — ENOXAPARIN SODIUM 40 MG/0.4ML ~~LOC~~ SOLN
40.0000 mg | SUBCUTANEOUS | Status: DC
  Administered 2017-07-03 – 2017-07-06 (×4): 40 mg via SUBCUTANEOUS
  Filled 2017-07-02 (×4): qty 0.4

## 2017-07-02 MED ORDER — METOPROLOL TARTRATE 25 MG PO TABS
12.5000 mg | ORAL_TABLET | Freq: Two times a day (BID) | ORAL | Status: DC
  Administered 2017-07-02 – 2017-07-06 (×7): 12.5 mg via ORAL
  Filled 2017-07-02 (×7): qty 1

## 2017-07-02 MED ORDER — IOPAMIDOL (ISOVUE-370) INJECTION 76%: 100.0000 mL | Freq: Once | INTRAVENOUS | Status: DC | PRN

## 2017-07-02 MED ORDER — IOPAMIDOL (ISOVUE-370) INJECTION 76%
100.0000 mL | Freq: Once | INTRAVENOUS | Status: AC | PRN
  Administered 2017-07-02: 100 mL via INTRAVENOUS

## 2017-07-02 MED ORDER — HYDROCODONE-ACETAMINOPHEN 7.5-325 MG PO TABS
1.0000 | ORAL_TABLET | Freq: Three times a day (TID) | ORAL | Status: DC | PRN
  Administered 2017-07-03 – 2017-07-06 (×11): 1 via ORAL
  Filled 2017-07-02 (×11): qty 1

## 2017-07-02 MED ORDER — SODIUM CHLORIDE 0.9% FLUSH
3.0000 mL | Freq: Two times a day (BID) | INTRAVENOUS | Status: DC
  Administered 2017-07-02 – 2017-07-06 (×7): 3 mL via INTRAVENOUS

## 2017-07-02 MED ORDER — VITAMIN D 1000 UNITS PO TABS
2000.0000 [IU] | ORAL_TABLET | Freq: Every day | ORAL | Status: DC
  Administered 2017-07-03 – 2017-07-06 (×4): 2000 [IU] via ORAL
  Filled 2017-07-02 (×4): qty 2

## 2017-07-02 MED ORDER — MORPHINE SULFATE ER 15 MG PO TBCR
30.0000 mg | EXTENDED_RELEASE_TABLET | Freq: Two times a day (BID) | ORAL | Status: DC
  Administered 2017-07-02 – 2017-07-06 (×8): 30 mg via ORAL
  Filled 2017-07-02 (×8): qty 2

## 2017-07-02 MED ORDER — FERROUS GLUCONATE 324 (38 FE) MG PO TABS
324.0000 mg | ORAL_TABLET | Freq: Every day | ORAL | Status: DC
  Administered 2017-07-03 – 2017-07-06 (×4): 324 mg via ORAL
  Filled 2017-07-02 (×4): qty 1

## 2017-07-02 NOTE — H&P (Signed)
Date: 07/02/2017               Patient Name:  [name] Bell MRN: [id number]  DOB: [birth year] Age / Sex: 72 y.o., female   PCP: Tawny Asal, MD         Medical Service: Internal Medicine Teaching Service         Attending Physician: Dr. Sid Falcon, MD    First Contact: Dr. Kathi Ludwig Pager: [phone]  Second Contact: Dr. Ledell Noss Pager: [phone]       After Hours (After 5p/  First Contact Pager: [phone]  weekends / holidays): Second Contact Pager: [phone]   Chief Complaint: Diarrhea, nausea, vomiting  History of Present Illness:  [name] Bell is a 72 yo with PMH of COPD, HLD, ischemic colitis s/p end colostomy with Hartmann's pouch, and iron deficiency anemia who is presenting for evaluation of 1 month long history of lower quadrant abdominal pain, diarrhea, and nausea, vomiting x1 day. History obtained directly from the patient. Per chart review patient was recently hospitalized from 5/9 - 06/08/2017 for this constellation of symptoms. During that hospitalization patient was noted to have inflammatory colitis in her transverse colon and Hartman's pouch on initial imaging. Workup of this colitis included a GI pathogen panel and stool culture which were negative for infection, endoscopy with balloon dilation of esophageal stricture and pyloric stenosis, proctoscopy and partial colonoscopy with biopsies that were negative for dysplasia and showed benign colonic mucosa. Patient treated with supportive care and further workup as an outpatient (with GI follow up scheduled on 07/15/2017 as outpatient). Since discharge from hospital, patient states that her symptoms of abdominal pain and diarrhea have been ongoing. Patient reports that the abdominal pain is unbearable, constant, and relieved with home pain medication which she has taken too much of recently because of the abdominal pain and run out of in the days leading up to admission. Patient notes having to empty her colostomy bag  7-8 times per day since discharge from the hospital. She notes that throughout the entire month (including hospitalization) her bag contents have smelt "infected". She is legally blind and cannot tell if there has been a change in color of output, but she thinks output is intermittently more liquid in nature (with some days were it remains formed). Since symptoms began 1 month ago patient has noted ongoing mucus discharge from her rectum, a symptom which has worsened in the days leading up to admission. Patient reports that she has never been incontinent in the past but rectal mucus has been so severe that she has to wear a diaper to prevent accidents. 24 hours leading up to admission patient noticed sudden onset of vomiting around 11:00pm. Patient states she has vomited 7-8 times since her emesis started. Denies tasting blood in emesis. States that she has been able to tolerate a clear liquid diet since leaving the hospital, but was unable to keep down fluids when the vomiting began. Because of ongoing symptoms and new onset nausea/vomiting, patient presented to ED for further evaluation.   Per chart review patient has been seen in [facility] clinic on two occasions since discharge on 06/08/2017. During the first visit patient noted increased ostomy output and ongoing abdominal pain, not much improved since hospitalization. No nausea, vomiting or difficulty tolerating PO intake. On her second visit, one day prior to presentation, patient noted diffuse abdominal pain and ongoing diarrhea. Patient continuing to tolerate PO intake at that time, so was instructed to stop  taking bowel regimen for chronic opioid therapy and to keep follow up with GI as outpatient. Also during this visit patient was noted to have 11 lb weight loss since 04/27/2017, about 1 month prior to onset of symptoms. Lastly patient was thought to have a COPD exacerbation during this visit and started on steroids, azithromycin for treatment   Upon arrival  to the ED the patient was afebrile (Tmax 37.2), intermittently tachycardic to 110s, normotensive, and saturating 100% on room air. Patient's CBC unremarkable. CMP with bicarb 21, AST 63, ALT 17, and alk phosphatase of 140. Urinalysis with moderate leukocytes, negative nitrites. Initial iSTAT lactate = 7.12, with repeat values of 4.28 and 1.49 after 2L NS boluses. Given the elevated lactate and history of ischemic colitis, ED provider was concerned about mesenteric ischemia so ordered CT angiography of abdomen and pelvis. This study showed minimal and nonocclusive narrowing of celiac and renal arteries and normal IMA/SMA. Patient has persistent wall thickening of rectum with mild adjacent stranding of perirectal fat, indicative of persistent proctitis. Previous colitis observed on 5/9 has improved/resolved. Given signs of proctitis on admission and elevated lactate (with intermittent tachycardia) EDP was worried about sepsis, so vancomycin/zosyn was administered, blood cultures were obtained, and IMTS was called for admission/further workup.  Meds:  Current Meds  Medication Sig  . albuterol (PROVENTIL HFA;VENTOLIN HFA) 108 (90 Base) MCG/ACT inhaler Inhale 2 puffs into the lungs every 6 (six) hours as needed for wheezing or shortness of breath.  Marland Kitchen albuterol (PROVENTIL) (2.5 MG/3ML) 0.083% nebulizer solution Take 3 mLs (2.5 mg total) by nebulization every 6 (six) hours as needed for wheezing or shortness of breath.  Marland Kitchen alendronate (FOSAMAX) 70 MG tablet Take 1 tablet (70 mg total) by mouth once a week. Take with a full glass of water on an empty stomach.  . dextromethorphan-guaiFENesin (MUCINEX DM) 30-600 MG 12hr tablet Take 1 tablet by mouth 2 (two) times daily. (Patient taking differently: Take 1 tablet by mouth 2 (two) times daily as needed for cough. )  . diphenhydrAMINE (BENADRYL) 25 MG tablet Take 25 mg by mouth every 6 (six) hours as needed for allergies.  Marland Kitchen docusate sodium (COLACE) 100 MG capsule Take  2 capsules (200 mg total) by mouth daily. (Patient taking differently: Take 200 mg by mouth daily as needed for mild constipation. )  . Ergocalciferol (VITAMIN D2) 2000 units TABS Take 2,000 Units by mouth daily.  Marland Kitchen esomeprazole (NEXIUM) 40 MG capsule Take 1 capsule (40 mg total) by mouth 2 (two) times daily before a meal.  . ferrous gluconate (FERGON) 324 MG tablet Take 1 tablet (324 mg total) by mouth daily with breakfast.  . fluticasone (FLONASE) 50 MCG/ACT nasal spray Place 2 sprays into both nostrils daily.  Marland Kitchen HYDROcodone-acetaminophen (NORCO) 7.5-325 MG tablet Take 1 tablet by mouth every 8 (eight) hours as needed for moderate pain.  Marland Kitchen metoCLOPramide (REGLAN) 10 MG tablet Take 1 tablet (10 mg total) by mouth every 8 (eight) hours as needed for refractory nausea / vomiting.  . metoprolol tartrate (LOPRESSOR) 25 MG tablet Take 0.5 tablets (12.5 mg total) by mouth 2 (two) times daily.  Marland Kitchen morphine (MS CONTIN) 15 MG 12 hr tablet Take 2 tablets (30 mg total) by mouth every 12 (twelve) hours.  . ondansetron (ZOFRAN) 4 MG tablet Take 1 tablet (4 mg total) by mouth every 8 (eight) hours as needed for nausea or vomiting.  . polyethylene glycol (MIRALAX / GLYCOLAX) packet Take 17 g by mouth daily as needed. (  Patient taking differently: Take 17 g by mouth daily as needed for moderate constipation. )  . pravastatin (PRAVACHOL) 40 MG tablet Take 1 tablet (40 mg total) by mouth daily.  . predniSONE (DELTASONE) 20 MG tablet Take 2 tablets (40 mg total) by mouth daily for 5 days.  . [DISCONTINUED] azithromycin (ZITHROMAX Z-PAK) 250 MG tablet Take 2 tablets (500 mg) on  Day 1,  followed by 1 tablet (250 mg) once daily on Days 2 through 5.   Allergies: Allergies as of 07/02/2017 - Review Complete 07/02/2017  Allergen Reaction Noted  . Ibuprofen Other (See Comments)   . Nsaids Other (See Comments)   . Latex Itching 07/29/2011   Past Medical History: Past Medical History:  Diagnosis Date  . Acute sinusitis  06/30/2011  . Anxiety   . Basal cell carcinoma    "left cheek"  . Bleeding ulcer   . Blind in both eyes   . Chronic back pain   . Degenerative disk disease    This is interscapular, mild compression frx of T12 superior endplate with Schmorl's node. Seen on CT in 2/08  . Depression   . Duodenal ulcer 11/08   With hemorrhage and obstruction  . History of blood transfusion    "related to bleeding from intestines and vomiting blood"  . Ischemic colitis ([place]) 07/30/2011   2009 by endoscopy   . Macular degeneration, wet ([place])   . Osteomyelitis, jaw acute 11/08   Started while in the hospital abcess showed GNR . SP debridement by Dr. Lilli Few,  Priscella Mann S Bovis sp 4  weeks of Pen V started on 05/19/07  with additional 2 weeks in 07/04/07  . Perforated bowel [facility])    surgery july 2013  . Superior mesenteric artery syndrome ([facility]) 11/08   sp dilation by Dr. Watt Climes, Pt may require bowel resetion if sx recur   Family History:  Family History  Problem Relation Age of Onset  . Cancer Mother        Lung  . Heart disease Father   . Diabetes Father   . Diabetes Sister    Social History:  Social History   Tobacco Use  . Smoking status: Former Smoker    Packs/day: 1.50    Years: 59.00    Pack years: 88.50    Types: Cigarettes    Last attempt to quit: 05/30/2014    Years since quitting: 3.0  . Smokeless tobacco: Never Used  Substance Use Topics  . Alcohol use: No    Alcohol/week: 0.0 oz  . Drug use: No   Review of Systems: A complete ROS was negative except as per HPI.  Physical Exam: Blood pressure (!) 119/56, pulse (!) 105, temperature 98.3 F (36.8 C), temperature source Oral, resp. rate 17, height '5\' 4"'$  (1.626 m), weight 132 lb 6.4 oz (60.1 kg), last menstrual period 08/25/1980, SpO2 99 %.  Physical Exam  Constitutional: She is oriented to person, place, and time.  Thin elderly woman laying comfortably in bed in no acute distress  HENT:  Mouth/Throat: Oropharynx is clear and moist. No  oropharyngeal exudate.  Lips moist  Eyes: Conjunctivae are normal. No scleral icterus.  PERRL. Patient spontaneously moves eyes in all four directions.   Cardiovascular: Normal rate, regular rhythm and intact distal pulses. Exam reveals no friction rub.  No murmur heard. Respiratory: Effort normal and breath sounds normal. No respiratory distress. She has no wheezes. She has no rales.  GI: Soft. Bowel sounds are normal. She exhibits no  distension. There is tenderness (mild tenderness with palpation of LLQ). There is no rebound.  Colostomy bag intact with dark brown/green semi-solid output and gas.  Musculoskeletal: She exhibits no edema (of bilateral lower extremities) or tenderness (of bilateral lower extremities).  Neurological: She is alert and oriented to person, place, and time.  Face strength and sensation intact bilaterally. Tongue midline. Gross motor and sensation to light touch of upper and lower extremities intact bilaterally.   Skin: Skin is warm and dry. No rash noted. No erythema.   EKG: personally reviewed my interpretation is sinus tachycardia without ST elevation or TWI to suggest ischemia.   CXR: personally reviewed my interpretation is mild hyperinflation without pleural effusions, vascular congestion, and increased interstitial infiltrate/opacity to suggest acute cardiopulmonary process.   Assessment & Plan by Problem: Principal Problem:   Diarrhea Active Problems:   Colitis  [name] Bell is a 72 yo with PMH of COPD, HLD, ischemic colitis s/p end colostomy with Hartmann's pouch, and iron deficiency anemia who is presented for evaluation of ongoing abdominal pain, diarrhea, nausea, and vomiting. Patient admitted to the internal medicine teaching service for management. The specific problems addressed during admission are as follows:  Proctitis, diarrhea: Patient with ongoing abdominal pain, diarrhea, increased rectal mucus, and new onset nausea/vomiting. These symptoms  have been ongoing for >1 month and patient has seen multiple providers as both an inpatient and outpatient for further workup. During last hospitalization proctitis and colitis were noted on admission and continued proctitis identified today. Workup for infectious etiology negative during last admission. At this time, given the persistence of symptoms, absence of fever/leukocytosis/sick contacts, and negative GI pathogen panel, wonder if patient's symptoms are related to underlying inflammatory bowel disease rather than infectious colitis. The patient's biopsies from last hospitalization returned normal, suggesting no IBD, but I wonder if there could be a sampling bias obscuring the interpretation of these results given patient's ongoing symptoms of increased mucus production and abdominal pain (with evidence of continued inflammation on imaging). Patient has lost about 11 lbs since symptoms began, which is consistent with IBD. Per chart review, patient's proctitis was noted on CT scan from 10/2016, at which time patient was thought to have infectious colitis as well and was treated as outpatient with antibiotics. In retrospect this inflammation may be related to underlying inflammatory bowel disease not previously treated or well captured on biopsy. Given this suspicion, will try prednisone for patient's symptoms and see if there is clinical improvement since not much else has helped. Will also order hemoccult and WBC testing of the stool, if positive this would further support inflammatory etiology of patient's symptoms. At that time could consider repeat GI pathogen testing, stool/ova testing, or referral to GI for management/evaluation of possible IBD. If suspicion for IBD continues in AM, could consider switching to rectal prednisone for management rather than oral prednisone given patient's history of osteoporosis.  -Admit to med-surg -Zofran PRN for nausea -Trial of prednisone 40 mg daily -Hemoccult  testing, stool lactoferrin -Pain control with home pain regimen  Iron deficiency anemia: Chronic, stable. Patient's Hgb within normal limits on admission (improved from previous hospitalization).  -Continue home ferrous gluconate 324 mg daily  COPD: Patient without current signs, symptoms of COPD exacerbation. Patient's history inconsistent with what she told provider in [facility] yesterday, stating that she doesn't have a productive cough or any difficulty breathing. No wheezing, congestion, or worsening of hyperinflation on CXR. Will discontinue azithromycin for treatment of COPD exacerbation, as this medication may  exacerbate diarrhea and continue to follow.  -Albuterol nebulizer PRN  Elevated AST, hepatic steatosis: Patient with hepatic steatosis noted on imaging from 10/2016. Previous AST/ALT within normal limits, slightly elevated today. No real evidence of cirrhosis on labs today or in the past. Patient would benefit from continued monitoring/workup as outpatient.  -CMP in AM  Chronic back pain: Chronic, stable. Stable T12 compression fracture noted on admission. -Holding home bowel regimen in the setting of reported diarrhea -Home MS Contin 30 mg BID and Norco 7.5-325 mg, 1 tablet TID PRN  HLD: Chronic, stable.  -Continue home pravastatin 40 mg daily  Sinus tachycardia: Chronic, stable. -Continue home metoprolol 12.5 mg BID  Osteoporosis: Chronic, stable. -Continue home vitamin D supplemenation daily  FEN/GI: -Regular -No IVF (s/p 2L in ED), electrolytes within normal limits -Pantoprazole 40 mg daily  VTE Prophylaxis: Lovenox daily Code Status: Full  Dispo: Admit patient to Observation with expected length of stay less than 2 midnights.  SignedThomasene Ripple, MD 07/02/2017, 11:58 PM  Pager: [phone]

## 2017-07-02 NOTE — ED Notes (Signed)
Patient transported to CT 

## 2017-07-02 NOTE — ED Notes (Signed)
Patient transported to X-ray 
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
## 2017-07-02 NOTE — Progress Notes (Signed)
Pharmacy Antibiotic Note  [name] Bell is a 72 y.o. female admitted on 07/02/2017 with sepsis.  Pharmacy has been consulted for Vancomycin and Zosyn dosing.  Patient came to ED with N/V/D. Endorses diarrhea onset 1 week ago. Has a colostomy bag. N/V started last night. Recent dx of colitis actively being treated with PTA azithromycin.   Lactic acid 7.12, WBC 6.6, afebrile. ~CrCl ~45-50 ml/min.  Plan: Vancomycin 1250 mg IV x 1 Vancomycin 500 mg IV Q12h Zosyn 3.375 g Q8h Follow-up on clinical progression, BCx, LOT, and de-escalation of therapy Obtain VTs as appropriate  Height: 5\' 4"  (162.6 cm) Weight: 131 lb (59.4 kg) IBW/kg (Calculated) : 54.7  Temp (24hrs), Avg:99.1 F (37.3 C), Min:99.1 F (37.3 C), Max:99.1 F (37.3 C)  Recent Labs  Lab 07/02/17 1653  LATICACIDVEN 7.12*    CrCl cannot be calculated (Patient's most recent lab result is older than the maximum 21 days allowed.).    Allergies  Allergen Reactions  . Ibuprofen Other (See Comments)    REACTION: Bleeding ulcers  . Nsaids Other (See Comments)    REACTION: Bleeding Ulcer  . Latex Itching    Antimicrobials this admission: 6/7 Vanc >>  6/7 Zosyn >>   Dose adjustments this admission:   Microbiology results: 6/7 BCx:    Leroy Libman, PharmD Pharmacy Resident

## 2017-07-02 NOTE — ED Notes (Signed)
Attempted to call report

## 2017-07-02 NOTE — ED Triage Notes (Signed)
Pt BIB EMS from home for n/v/d. Pt endorse diarrhea over 1 week and has colostomy and states "its been coming out of my rectum and vagina, and it's not supposed to do that." Pt states n/v started last night. Recently dx with colitis and has been taking "arithromycin." A&Ox4; resp e/u. NAD.

## 2017-07-03 DIAGNOSIS — J449 Chronic obstructive pulmonary disease, unspecified: Secondary | ICD-10-CM

## 2017-07-03 DIAGNOSIS — K760 Fatty (change of) liver, not elsewhere classified: Secondary | ICD-10-CM | POA: Diagnosis not present

## 2017-07-03 DIAGNOSIS — D509 Iron deficiency anemia, unspecified: Secondary | ICD-10-CM | POA: Diagnosis not present

## 2017-07-03 DIAGNOSIS — M810 Age-related osteoporosis without current pathological fracture: Secondary | ICD-10-CM

## 2017-07-03 DIAGNOSIS — G8929 Other chronic pain: Secondary | ICD-10-CM

## 2017-07-03 DIAGNOSIS — Z933 Colostomy status: Secondary | ICD-10-CM | POA: Diagnosis not present

## 2017-07-03 DIAGNOSIS — Z91040 Latex allergy status: Secondary | ICD-10-CM

## 2017-07-03 DIAGNOSIS — Z87891 Personal history of nicotine dependence: Secondary | ICD-10-CM | POA: Diagnosis not present

## 2017-07-03 DIAGNOSIS — K559 Vascular disorder of intestine, unspecified: Secondary | ICD-10-CM | POA: Diagnosis not present

## 2017-07-03 DIAGNOSIS — E785 Hyperlipidemia, unspecified: Secondary | ICD-10-CM | POA: Diagnosis not present

## 2017-07-03 DIAGNOSIS — A0472 Enterocolitis due to Clostridium difficile, not specified as recurrent: Secondary | ICD-10-CM | POA: Diagnosis not present

## 2017-07-03 DIAGNOSIS — R000 Tachycardia, unspecified: Secondary | ICD-10-CM | POA: Diagnosis not present

## 2017-07-03 DIAGNOSIS — M549 Dorsalgia, unspecified: Secondary | ICD-10-CM

## 2017-07-03 DIAGNOSIS — Z886 Allergy status to analgesic agent status: Secondary | ICD-10-CM

## 2017-07-03 DIAGNOSIS — K6289 Other specified diseases of anus and rectum: Secondary | ICD-10-CM | POA: Diagnosis not present

## 2017-07-03 LAB — CLOSTRIDIUM DIFFICILE BY PCR, REFLEXED: Toxigenic C. Difficile by PCR: POSITIVE — AB

## 2017-07-03 LAB — COMPREHENSIVE METABOLIC PANEL
ALBUMIN: 2.3 g/dL — AB (ref 3.5–5.0)
ALT: 19 U/L (ref 14–54)
ANION GAP: 9 (ref 5–15)
AST: 62 U/L — ABNORMAL HIGH (ref 15–41)
Alkaline Phosphatase: 135 U/L — ABNORMAL HIGH (ref 38–126)
BILIRUBIN TOTAL: 0.9 mg/dL (ref 0.3–1.2)
BUN: <5 mg/dL — ABNORMAL LOW (ref 6–20)
CO2: 22 mmol/L (ref 22–32)
Calcium: 8.6 mg/dL — ABNORMAL LOW (ref 8.9–10.3)
Chloride: 111 mmol/L (ref 101–111)
Creatinine, Ser: 0.79 mg/dL (ref 0.44–1.00)
GFR calc Af Amer: >60 mL/min (ref >60)
GFR calc non Af Amer: >60 mL/min (ref >60)
Glucose, Bld: 117 mg/dL — ABNORMAL HIGH (ref 65–99)
POTASSIUM: 4.3 mmol/L (ref 3.5–5.1)
Sodium: 142 mmol/L (ref 135–145)
TOTAL PROTEIN: 5.1 g/dL — AB (ref 6.5–8.1)

## 2017-07-03 LAB — LACTOFERRIN, FECAL, QUALITATIVE: LACTOFERRIN, FECAL, QUAL: POSITIVE — AB

## 2017-07-03 LAB — OCCULT BLOOD X 1 CARD TO LAB, STOOL: Fecal Occult Bld: NEGATIVE

## 2017-07-03 LAB — C DIFFICILE QUICK SCREEN W PCR REFLEX
C Diff antigen: POSITIVE — AB
C Diff toxin: NEGATIVE

## 2017-07-03 MED ORDER — VANCOMYCIN 50 MG/ML ORAL SOLUTION
125.0000 mg | Freq: Four times a day (QID) | ORAL | Status: DC
  Administered 2017-07-03 – 2017-07-06 (×11): 125 mg via ORAL
  Filled 2017-07-03 (×14): qty 2.5

## 2017-07-03 MED ORDER — ONDANSETRON 4 MG PO TBDP
4.0000 mg | ORAL_TABLET | Freq: Three times a day (TID) | ORAL | Status: DC | PRN
  Administered 2017-07-03 – 2017-07-05 (×3): 4 mg via ORAL
  Filled 2017-07-03 (×3): qty 1

## 2017-07-03 MED ORDER — ACETAMINOPHEN 160 MG/5ML PO SOLN
500.0000 mg | Freq: Four times a day (QID) | ORAL | Status: DC | PRN
  Administered 2017-07-03 – 2017-07-04 (×2): 500 mg via ORAL
  Filled 2017-07-03 (×2): qty 20.3

## 2017-07-03 MED ORDER — ACETAMINOPHEN 160 MG/5ML PO SOLN: 650.0000 mg | Freq: Four times a day (QID) | ORAL | Status: DC | PRN

## 2017-07-03 NOTE — Plan of Care (Signed)
  Problem: Pain Managment: Goal: General experience of comfort will improve Outcome: Progressing   Problem: Safety: Goal: Ability to remain free from injury will improve Outcome: Progressing   

## 2017-07-03 NOTE — Progress Notes (Signed)
Subjective: The patient was sitting in her bed today upon entering the room. She stated that overall she still feels terrible, has horrible unpredictable and uncontrollable flatulence, abdominal pain, frequent normally loose watery stool, they can be mucus discharge from her rectum and possibly her vagina which is been ongoing for 6 months now.  She denies fever, chills, nausea, chest pain, myalgias, joint aches, visual changes, or headache.  In addition to endorses a history of abdominal ball-like mass, resolves with manual manipulation is mildly painful on occasion.  Objective:  Vital signs in last 24 hours: Vitals:   07/02/17 2215 07/02/17 2230 07/02/17 2324 07/03/17 0604  BP: 127/63  (!) 119/56 (!) 111/44  Pulse:   (!) 105 86  Resp:   17 16  Temp:   98.3 F (36.8 C) 98 F (36.7 C)  TempSrc:   Oral Oral  SpO2:  97% 99% 94%  Weight:   132 lb 6.4 oz (60.1 kg)   Height:   5\' 4"  (1.626 m)    Physical Exam  Constitutional: She is oriented to person, place, and time. She appears well-developed and well-nourished. No distress.  HENT:  Head: Normocephalic and atraumatic.  Eyes: Conjunctivae and EOM are normal.  Cardiovascular: Normal rate and regular rhythm.  No murmur heard. Pulmonary/Chest: Effort normal and breath sounds normal. No stridor. No respiratory distress.  Abdominal: Soft. Bowel sounds are normal. She exhibits distension (Marked protrusion of the abdominal wall surrounding the ostium). There is tenderness.  Genitourinary: Vagina normal. No vaginal discharge found.  Musculoskeletal: She exhibits no edema or tenderness.  Neurological: She is alert and oriented to person, place, and time.  Skin: Skin is warm. Capillary refill takes less than 2 seconds. She is not diaphoretic.  Psychiatric: She has a normal mood and affect.  Vitals reviewed.  Assessment/Plan:  Principal Problem:   Diarrhea Active Problems:   Colitis   Hepatic steatosis  Assessment: [name] Bell at  72 year old female with past medical history notable for COPD, HLD, status post end colostomy with Hartman's pouch, and iron deficiency anemia who presented for evaluation of abdominal pain, diarrhea, nausea, vomiting. Initial evaluation was positive for clostridium difficile infection of the ostium contents.  Gastrology was called but stated indignantly that the rectum was viewed during the prior colonoscopy and that they were not sure what we wanted them to do about the mucus based discharge as all appeared normal at the time and no biopsies were apparently indicated despite the abnormal discharge. As such, we will treat her C. Diff and monitor for potential improvement although I not convinced this has anything to do with her abnormal discharge.   Plan: Abdominal pain, diarrhea w/ positive C. Diff: As above.  The patient's constellation of symptoms appear inconsistent with a diagnosis of Clostridium difficile given her lack of a leukocytosis and other classic findings. Aside from loose watery stool I would not be as convinced that this is a standard C. Diff infection as Dr. Paulita Fujita appears to be.  I am not convinced this is related to the excessive mucus production from the Hartman's pouch but do not have a clear explanation for this. --Oral Vancomycin 125mg  QID as per order set -Continue enteric precautions --Continue Zofran as needed for nausea --Discontinue prednisone --Stool lactoferrin positive --Continue pain control home pain regimen  Iron Deficiency anemia: Chronic, stable.  His hemoglobin is within normal limits. -Continue home ferrous gluconate 324 mg daily  COPD: Previously seen outpatient clinic thought to be experiencing a COPD  exacerbation at that time given her increased shortness of breath and mildly increased productive cough.  She was started on azithromycin and prednisone for 5 days.  These were both discontinued due to her current presentation.  We will monitor her respiratory  status moving forward. -Albuterol nebs as needed  HLD: No acute findings.  Continue home pravastatin 40 mg daily  Sinus tachycardia: No acute findings.  Continue home Toprol to 12.5 mg twice daily  Osteoporosis: No acute findings.  Continue home vitamin dislocation daily  Diet: Regular Code: Full Fluids: n/a GI PPX: protonix 40mg  dialy VTE PPX: Lovenox  Dispo: Anticipated discharge in approximately 2 day(s).   Kathi Ludwig, MD 07/03/2017, 10:04 AM Pager: Pager# [phone]

## 2017-07-03 NOTE — Progress Notes (Signed)
2325 received pt from ED. Very nice lady, blind but can see movement and light. Pain 5 out of 10, has been having for several months along with loose stool.

## 2017-07-04 DIAGNOSIS — A0472 Enterocolitis due to Clostridium difficile, not specified as recurrent: Secondary | ICD-10-CM | POA: Diagnosis not present

## 2017-07-04 MED ORDER — WITCH HAZEL-GLYCERIN EX PADS
MEDICATED_PAD | CUTANEOUS | Status: DC | PRN
  Administered 2017-07-04: 1 via TOPICAL
  Filled 2017-07-04: qty 100

## 2017-07-04 NOTE — Care Management Note (Signed)
Case Management Note  Patient Details  Name: [name] Bell MRN: [id number] Date of Birth: [birth year]  Subjective/Objective:          Patient discharged last w [facility], [place] services through [facility], has PCS w [name] gets colostomy supplies through [facility]. Was dismissed from [facility] case management 6/4 for inability to maintain contact w patient. Case management will continue to follow.           Action/Plan:   Expected Discharge Date:                  Expected Discharge Plan:  Home/Self Care  In-House Referral:     Discharge planning Services  CM Consult  Post Acute Care Choice:    Choice offered to:     DME Arranged:    DME Agency:     HH Arranged:    HH Agency:     Status of Service:  In process, will continue to follow  If discussed at Long Length of Stay Meetings, dates discussed:    Additional Comments:  Carles Collet, RN 07/04/2017, 3:42 PM

## 2017-07-04 NOTE — Evaluation (Signed)
Occupational Therapy Evaluation Patient Details Name: [name] Bell MRN: [id number] DOB: [birth year] Today's Date: 07/04/2017    History of Present Illness 72 year old female with past medical history notable for COPD, MD (wet) and legally blind, HLD, status post end colostomy with Hartman's pouch, and iron deficiency anemia who presented for evaluation of abdominal pain, diarrhea, nausea, vomiting. Pt reports after colostomy she has had mucus based discharge which has significantly increased recently.    Clinical Impression   PTA, pt was living alone and had a PCA who come each day (for three hours) to assist with bathing, dressing, cooking, and cleaning. Pt currently performing UB ADLs with supervision, grooming at sink with supervision, and LB ADLs with Mod-Max A due to pain when bending forward. Pt near baseline function and requiring cues throughout session due to blindness. Pt's main concern is stopping mucus discharge. Recommend dc home once medically stable per physician and continue HHPT and PCA. All acute OT needs met and will sign off.     Follow Up Recommendations  Supervision/Assistance - 24 hour;Other (comment)(Continue HHPT)    Equipment Recommendations  None recommended by OT    Recommendations for Other Services PT consult     Precautions / Restrictions Precautions Precautions: Fall;Other (comment)(Blind) Restrictions Weight Bearing Restrictions: No      Mobility Bed Mobility Overal bed mobility: Needs Assistance Bed Mobility: Supine to Sit     Supine to sit: Supervision     General bed mobility comments: supervision for safety and cues for visual deficits  Transfers Overall transfer level: Needs assistance Equipment used: None Transfers: Sit to/from Stand Sit to Stand: Min guard         General transfer comment: Min GUard A for safety and then single hand assist for visual deficits to walk    Balance Overall balance assessment: No apparent balance  deficits (not formally assessed)                                         ADL either performed or assessed with clinical judgement   ADL Overall ADL's : Needs assistance/impaired Eating/Feeding: Set up;Sitting(Cues for set up and visual deficits)   Grooming: Oral care;Set up;Supervision/safety;Cueing for sequencing;Standing;Wash/dry hands   Upper Body Bathing: Set up;Sitting   Lower Body Bathing: Moderate assistance;Sit to/from stand   Upper Body Dressing : Set up;Supervision/safety;Sitting   Lower Body Dressing: Maximal assistance;Sit to/from stand Lower Body Dressing Details (indicate cue type and reason): Pt unable to bend forward for donning socks due to pain at colostomy bag with a inflammed hernia. Max A for LB dressing. Pt reports that PCA assists with LB dressing PTA Toilet Transfer: Min guard;Ambulation;BSC   Toileting- Clothing Manipulation and Hygiene: Set up;Supervision/safety;Sit to/from stand       Functional mobility during ADLs: Minimal assistance(Single hand held for visual deficits) General ADL Comments: Pt with poor performance of LB ADLs and requires cues throughout session for visual deficits.      Vision Baseline Vision/History: Legally blind Patient Visual Report: No change from baseline       Perception     Praxis      Pertinent Vitals/Pain Pain Assessment: 0-10 Pain Score: 7  Pain Location: Hernia at left abdomen Pain Descriptors / Indicators: Cramping;Constant Pain Intervention(s): Monitored during session;Limited activity within patient's tolerance;Repositioned     Hand Dominance Right   Extremity/Trunk Assessment Upper Extremity Assessment Upper Extremity Assessment: Generalized  weakness   Lower Extremity Assessment Lower Extremity Assessment: Generalized weakness   Cervical / Trunk Assessment Cervical / Trunk Assessment: Normal;Other exceptions Cervical / Trunk Exceptions: Colostomy bag   Communication  Communication Communication: No difficulties   Cognition Arousal/Alertness: Awake/alert Behavior During Therapy: WFL for tasks assessed/performed Overall Cognitive Status: Within Functional Limits for tasks assessed                                     General Comments  NT emptying air from colostomy bag per pt request    Exercises     Shoulder Instructions      Home Living Family/patient expects to be discharged to:: Private residence Living Arrangements: Alone([facility] ILF) Available Help at Discharge: Personal care attendant Type of Home: Independent living facility Home Access: Level entry     Home Layout: One level     Bathroom Shower/Tub: Teacher, early years/pre: Handicapped height Bathroom Accessibility: Yes   Home Equipment: Environmental consultant - 4 wheels;Bedside commode;Shower seat;Grab bars - tub/shower;Hand held Adult nurse)   Additional Comments: HHPT Tommi Rumps) comes twice a week      Prior Functioning/Environment Level of Independence: Needs assistance  Gait / Transfers Assistance Needed: walks outdoors around block with PCA daily, uses rollator in home per pt ADL's / Homemaking Assistance Needed: Aide for dressing/bathing, simple cleaning, and simple cooking. Requiring assistance for visual deficit            OT Problem List: Decreased strength;Decreased range of motion;Decreased activity tolerance;Impaired balance (sitting and/or standing);Pain;Impaired vision/perception      OT Treatment/Interventions:      OT Goals(Current goals can be found in the care plan section) Acute Rehab OT Goals Patient Stated Goal: Stop the mucus OT Goal Formulation: With patient Time For Goal Achievement: 07/04/17 Potential to Achieve Goals: Good  OT Frequency:     Barriers to D/C:            Co-evaluation              AM-PAC PT "6 Clicks" Daily Activity     Outcome Measure Help from another person eating meals?: A Little Help  from another person taking care of personal grooming?: A Little Help from another person toileting, which includes using toliet, bedpan, or urinal?: A Little Help from another person bathing (including washing, rinsing, drying)?: A Little Help from another person to put on and taking off regular upper body clothing?: A Little Help from another person to put on and taking off regular lower body clothing?: A Little 6 Click Score: 18   End of Session Nurse Communication: Mobility status  Activity Tolerance: Patient tolerated treatment well Patient left: in chair;with call bell/phone within reach  OT Visit Diagnosis: Unsteadiness on feet (R26.81);Other abnormalities of gait and mobility (R26.89);Muscle weakness (generalized) (M62.81);Pain;Low vision, both eyes (H54.2) Pain - Right/Left: Left Pain - part of body: (Abdomen)                Time: 1610-9604 OT Time Calculation (min): 38 min Charges:  OT General Charges $OT Visit: 1 Visit OT Evaluation $OT Eval Moderate Complexity: 1 Mod OT Treatments $Self Care/Home Management : 23-37 mins G-Codes:     [place], OTR/L Acute Rehab Pager: [phone] Office: [place] 07/04/2017, 9:02 AM

## 2017-07-04 NOTE — Progress Notes (Signed)
  Date: 07/04/2017  Patient name: [name] Bell  Medical record number: [id number]  Date of birth: 1945/06/04   This patient's plan of care was discussed with the house staff. Please see their note for complete details. I concur with their findings.   Sid Falcon, MD 07/04/2017, 8:09 PM

## 2017-07-04 NOTE — Progress Notes (Signed)
Pt c/o rectal pain, looked at area, no areas of pressure injury noted.  Applied sacral foam to site and ordered pressure redistribution chair cushion that pt can take home with her.

## 2017-07-04 NOTE — Evaluation (Signed)
Physical Therapy Evaluation Patient Details Name: [name] Bell MRN: [id number] DOB: 30-Jan-1945 Today's Date: 07/04/2017   History of Present Illness  72 year old female with past medical history notable for COPD, MD (wet) and legally blind, HLD, status post end colostomy with Hartman's pouch, and iron deficiency anemia who presented for evaluation of abdominal pain, diarrhea, nausea, vomiting. Pt reports after colostomy she has had mucus based discharge which has significantly increased recently.     Clinical Impression  Pt admitted with above diagnosis. Pt currently with functional limitations due to the deficits listed below (see PT Problem List). On eval, pt required supervision bed mobility, min guard assist transfers and min guard HHA of 1 ambulation 75 feet. Pt will benefit from skilled PT to increase their independence and safety with mobility to allow discharge to the venue listed below.       Follow Up Recommendations Home health PT;Supervision - Intermittent    Equipment Recommendations  None recommended by PT    Recommendations for Other Services       Precautions / Restrictions Precautions Precautions: Fall;Other (comment) Precaution Comments: blind (can see shadows/outlines), colostomy      Mobility  Bed Mobility Overal bed mobility: Needs Assistance Bed Mobility: Supine to Sit;Sit to Supine     Supine to sit: Supervision;HOB elevated Sit to supine: HOB elevated;Supervision   General bed mobility comments: supervision for safety and cues for visual deficits, +rail  Transfers Overall transfer level: Needs assistance Equipment used: Ambulation equipment used   Sit to Stand: Min guard         General transfer comment: min guard for safety  Ambulation/Gait Ambulation/Gait assistance: Min guard Ambulation Distance (Feet): 75 Feet Assistive device: 1 person hand held assist Gait Pattern/deviations: Step-through pattern Gait velocity: mildly  decreased Gait velocity interpretation: 1.31 - 2.62 ft/sec, indicative of limited community ambulator General Gait Details: steady gait. Distance limited by pain.  Stairs            Wheelchair Mobility    Modified Rankin (Stroke Patients Only)       Balance Overall balance assessment: No apparent balance deficits (not formally assessed)                                           Pertinent Vitals/Pain Pain Assessment: 0-10 Pain Score: 6  Pain Location: Hernia at left abdomen Pain Descriptors / Indicators: Cramping;Constant;Grimacing Pain Intervention(s): Monitored during session;Limited activity within patient's tolerance;RN gave pain meds during session    [place] expects to be discharged to:: Private residence Living Arrangements: Alone Available Help at Discharge: Personal care attendant Type of Home: Independent living facility Home Access: Level entry     Home Layout: One level Home Equipment: Environmental consultant - 4 wheels;Bedside commode;Shower seat;Grab bars - tub/shower;Hand held shower head Additional Comments: HHPT Tommi Rumps) comes twice a week    Prior Function Level of Independence: Needs assistance   Gait / Transfers Assistance Needed: walks outdoors around block with PCA daily, uses rollator in home per pt  ADL's / Homemaking Assistance Needed: Aide for dressing/bathing, simple cleaning, and simple cooking. Requiring assistance for visual deficit. Aide drives pt to the grocery store.  Comments: PCA 3 hrs/day     Hand Dominance   Dominant Hand: Right    Extremity/Trunk Assessment   Upper Extremity Assessment Upper Extremity Assessment: Generalized weakness    Lower Extremity Assessment Lower Extremity  Assessment: Generalized weakness    Cervical / Trunk Assessment Cervical / Trunk Assessment: Normal;Other exceptions Cervical / Trunk Exceptions: Colostomy bag  Communication   Communication: No difficulties  Cognition  Arousal/Alertness: Awake/alert Behavior During Therapy: WFL for tasks assessed/performed Overall Cognitive Status: Within Functional Limits for tasks assessed                                        General Comments      Exercises     Assessment/Plan    PT Assessment Patient needs continued PT services  PT Problem List Decreased strength;Decreased mobility;Decreased activity tolerance;Pain       PT Treatment Interventions Therapeutic activities;Gait training;Therapeutic exercise;Patient/family education;Functional mobility training    PT Goals (Current goals can be found in the Care Plan section)  Acute Rehab PT Goals Patient Stated Goal: Stop the mucus PT Goal Formulation: With patient Time For Goal Achievement: 07/18/17 Potential to Achieve Goals: Good    Frequency Min 3X/week   Barriers to discharge        Co-evaluation               AM-PAC PT "6 Clicks" Daily Activity  Outcome Measure Difficulty turning over in bed (including adjusting bedclothes, sheets and blankets)?: None Difficulty moving from lying on back to sitting on the side of the bed? : A Little Difficulty sitting down on and standing up from a chair with arms (e.g., wheelchair, bedside commode, etc,.)?: A Little Help needed moving to and from a bed to chair (including a wheelchair)?: A Little Help needed walking in hospital room?: A Little Help needed climbing 3-5 steps with a railing? : A Little 6 Click Score: 19    End of Session Equipment Utilized During Treatment: Gait belt Activity Tolerance: Patient tolerated treatment well Patient left: in bed;with call bell/phone within reach;with nursing/sitter in room Nurse Communication: Mobility status PT Visit Diagnosis: Difficulty in walking, not elsewhere classified (R26.2)    Time: 6168-3729 PT Time Calculation (min) (ACUTE ONLY): 13 min   Charges:   PT Evaluation $PT Eval Moderate Complexity: 1 Mod     PT G Codes:         Lorrin Goodell, PT  Office # [phone] Pager [phone]   Lorriane Shire 07/04/2017, 12:04 PM

## 2017-07-04 NOTE — Progress Notes (Signed)
   Subjective: Feeling much better since starting the po vancomycin. Says that her symptoms of abdominal discomfort and rectal discharge have resolved. She has continued to experience rectal discomfort and a foreign body sensation like there are pebbles in her rectum. Exam yesterday revealed large hemorrhoids   Objective:  Vital signs in last 24 hours: Vitals:   07/03/17 1235 07/03/17 2114 07/04/17 0437 07/04/17 0440  BP: (!) 114/42 (!) 101/47 (!) 97/40 (!) 103/43  Pulse: 90 92 84 85  Resp: 17 16 16    Temp: 98 F (36.7 C) 98.1 F (36.7 C) 97.7 F (36.5 C)   TempSrc: Oral Oral Oral   SpO2: 97% 94% 96%   Weight:   140 lb 14 oz (63.9 kg)   Height:       General: no acute distress  Cardiac: regular rate and rhythm, no murmur appreciated, no peripheral edema  Pulm: normal work of breathing, lungs clear to auscultation  GI: soft, non distended, tenderness to palpation in the right lower quadrant   Assessment/Plan:  Principal Problem:   Diarrhea Active Problems:   Colitis   Hepatic steatosis   Assessment: [name] Bell at 72 year old female with past medical history notable for COPD, HLD, status post end colostomy with Hartman's pouch, and iron deficiency anemia who presented for evaluation of abdominal pain, diarrhea, nausea, vomiting. Initial evaluation was positive for clostridium difficile infection of the ostium contents.  Gastrology was called but stated indignantly that the rectum was viewed during the prior colonoscopy and no additional  biopsies were indicated. As such, we will treat her C. Diff and monitor for potential improvement.  Plan: Abdominal pain, diarrhea w/ positive C. Diff: As above.  The patient's constellation of symptoms appear inconsistent with a diagnosis of Clostridium difficile given her lack of a leukocytosis and other classic findings. Fortunately her symptoms have shown significant improvement with PO vanc.  --Oral Vancomycin 125mg  QID as per order  set -Continue enteric precautions --Continue Zofran as needed for nausea --Stool lactoferrin positive --Continue pain control home pain regimen -Physical therapy recommends home health PT with intermittent supervision  Iron Deficiency anemia: Chronic, stable.  His hemoglobin is within normal limits. -Continue home ferrous gluconate 324 mg daily  COPD: Appears to be stable at this time.  -Albuterol nebs as needed  HLD: No acute findings.  Continue home pravastatin 40 mg daily  Sinus tachycardia: No acute findings.  Continue home Toprol to 12.5 mg twice daily  Osteoporosis: No acute findings.  Continue home vitamin D  Dispo: Anticipated discharge in approximately 1-2 day(s).   Ledell Noss, MD 07/04/2017, 12:42 PM Pager: [phone]

## 2017-07-05 DIAGNOSIS — Z91040 Latex allergy status: Secondary | ICD-10-CM | POA: Diagnosis not present

## 2017-07-05 DIAGNOSIS — M810 Age-related osteoporosis without current pathological fracture: Secondary | ICD-10-CM | POA: Diagnosis not present

## 2017-07-05 DIAGNOSIS — Z933 Colostomy status: Secondary | ICD-10-CM | POA: Diagnosis not present

## 2017-07-05 DIAGNOSIS — Z79899 Other long term (current) drug therapy: Secondary | ICD-10-CM | POA: Diagnosis not present

## 2017-07-05 DIAGNOSIS — J449 Chronic obstructive pulmonary disease, unspecified: Secondary | ICD-10-CM | POA: Diagnosis not present

## 2017-07-05 DIAGNOSIS — G894 Chronic pain syndrome: Secondary | ICD-10-CM

## 2017-07-05 DIAGNOSIS — A0472 Enterocolitis due to Clostridium difficile, not specified as recurrent: Principal | ICD-10-CM

## 2017-07-05 DIAGNOSIS — R000 Tachycardia, unspecified: Secondary | ICD-10-CM | POA: Diagnosis not present

## 2017-07-05 DIAGNOSIS — D509 Iron deficiency anemia, unspecified: Secondary | ICD-10-CM | POA: Diagnosis not present

## 2017-07-05 DIAGNOSIS — E785 Hyperlipidemia, unspecified: Secondary | ICD-10-CM | POA: Diagnosis not present

## 2017-07-05 DIAGNOSIS — Z886 Allergy status to analgesic agent status: Secondary | ICD-10-CM | POA: Diagnosis not present

## 2017-07-05 LAB — BASIC METABOLIC PANEL
Anion gap: 8 (ref 5–15)
CALCIUM: 7.8 mg/dL — AB (ref 8.9–10.3)
CO2: 30 mmol/L (ref 22–32)
Chloride: 106 mmol/L (ref 101–111)
Creatinine, Ser: 0.68 mg/dL (ref 0.44–1.00)
GFR calc Af Amer: >60 mL/min (ref >60)
GLUCOSE: 90 mg/dL (ref 65–99)
Potassium: 3.2 mmol/L — ABNORMAL LOW (ref 3.5–5.1)
Sodium: 144 mmol/L (ref 135–145)

## 2017-07-05 MED ORDER — POTASSIUM CHLORIDE 20 MEQ/15ML (10%) PO SOLN
40.0000 meq | Freq: Once | ORAL | Status: AC
  Administered 2017-07-05: 40 meq via ORAL
  Filled 2017-07-05: qty 30

## 2017-07-05 MED ORDER — POTASSIUM CHLORIDE CRYS ER 20 MEQ PO TBCR: 20.0000 meq | EXTENDED_RELEASE_TABLET | ORAL | Status: DC

## 2017-07-05 NOTE — Progress Notes (Signed)
Medicine attending: I examined this patient today together with resident physician Dr. Kathi Ludwig and I concur with his evaluation and management plan which we discussed together. 72 year old woman admitted on June 7 for evaluation of abdominal pain, diarrhea, nausea, and vomiting.  She has a permanent end colostomy with a Hartmann's pouch placed following a perforated diverticulum which occurred in July 2013.  She has had a chronic pain syndrome since that time related to adhesion formation.  She just had upper and lower endoscopy done on May 14 of this year.  She required esophageal and gastric dilatation procedures.  There were nonspecific changes in the ascending colon with thickened mucosa.  Random biopsies negative for malignancy. Stool checked on admission positive for C. difficile.  She is already improving on oral vancomycin. She has noted increase in mucus production from her mucous fistula which concerns her.  Current abdominal exam: The abdomen is soft, nontender, decreased bowel sounds.  Some semi-formed stool in her left lower quadrant colostomy bag.  She appears to be improving on treatment for C. difficile colitis.  She has been afebrile since admission and has no elevation of her white count so this appears to be a mild infection.  Anticipate discharge tomorrow if she continues to make progress.

## 2017-07-05 NOTE — Plan of Care (Signed)
  Problem: Health Behavior/Discharge Planning: Goal: Ability to manage health-related needs will improve Outcome: Progressing   Problem: Clinical Measurements: Goal: Will remain free from infection Outcome: Progressing   Problem: Activity: Goal: Risk for activity intolerance will decrease Outcome: Progressing   Problem: Pain Managment: Goal: General experience of comfort will improve Outcome: Progressing   

## 2017-07-05 NOTE — Care Management Note (Addendum)
Case Management Note  Patient Details  Name: [name] Bell MRN: [id number] Date of Birth: 15-Jul-1945  Subjective/Objective:                    Action/Plan:  Sent benefits check for Oral vancomycin 125mg  QID  X 8 days. Patient from home with [facility] HHRN and HHPT , will need resumption of care orders and face to face  PT recommending HHPT  Spoke to Judson Roch at [facility] , she is aware patient discharging tomorrow 07/06/17 on PO Vancomycin for C diff . Will need orders and face to face and DC summary fax to [phone] Expected Discharge Date:                  Expected Discharge Plan:  [place]  In-House Referral:  NA  Discharge planning Services  CM Consult, Medication Assistance  Post Acute Care Choice:  Home Health Choice offered to:     DME Arranged:    DME Agency:     HH Arranged:    [place] Agency:     Status of Service:  In process, will continue to follow  If discussed at Long Length of Stay Meetings, dates discussed:    Additional Comments:  Marilu Favre, RN 07/05/2017, 3:58 PM

## 2017-07-05 NOTE — Progress Notes (Signed)
Subjective: The patient was resting in her bed today upon entering the room. She denied acute complaints today and stated that overall she feels improved although her symptoms persist at a lesser degree. She is in agreement with being discharged home tomorrow pending further improvement.   Objective:  Vital signs in last 24 hours: Vitals:   07/04/17 0440 07/04/17 2032 07/05/17 0520 07/05/17 1334  BP: (!) 103/43 (!) 121/48 (!) 113/38 (!) 127/56  Pulse: 85 75 84 79  Resp:    16  Temp:  97.9 F (36.6 C) 97.6 F (36.4 C) 97.9 F (36.6 C)  TempSrc:  Oral Oral Oral  SpO2:  95% 96% 98%  Weight:      Height:       Physical Exam  Constitutional: She is oriented to person, place, and time. She appears well-developed and well-nourished. No distress.  HENT:  Head: Normocephalic and atraumatic.  Cardiovascular: Normal rate and regular rhythm.  No murmur heard. Pulmonary/Chest: Effort normal and breath sounds normal. No respiratory distress.  Abdominal: Soft. Bowel sounds are normal. She exhibits no distension. There is tenderness (Mild, surrounding the ostomy ).  Musculoskeletal: She exhibits no edema or tenderness.  Neurological: She is alert and oriented to person, place, and time.  Skin: Skin is warm. Capillary refill takes less than 2 seconds. She is not diaphoretic.  Psychiatric: She has a normal mood and affect.  Vitals reviewed.  Assessment/Plan:  Principal Problem:   Diarrhea Active Problems:   Colitis   Hepatic steatosis   Assessment: [name] Bell at 72 year old female with past medical history notable for COPD, HLD, status post end colostomy with Hartman's pouch, and iron deficiency anemia who presented for evaluation of abdominal pain, diarrhea, nausea, vomiting. Initial evaluation was positive for clostridium difficile infection of the ostium contents.  Gastrology was called but stated indignantly that the rectum was viewed during the prior colonoscopy and that they  were not sure what we wanted them to do about the rectal discharge as all appeared normal at the time and no biopsies were apparently indicated despite the abnormal discharge. As such, we will treat her C. Diff and monitor for potential improvement although I not convinced this has anything to do with her abnormal rectal discharge.   Plan: Abdominal pain, diarrhea w/ positive C. Diff: As above.  The patient's constellation of symptoms appear inconsistent with a diagnosis of Clostridium difficile given her lack of a leukocytosis and other classic findings. Aside from loose watery stool I would not be as convinced that this is a standard C. Diff infection as Dr. Paulita Fujita appears to be.  I am not convinced this is related to the excessive mucus production from the Hartman's pouch but do not have a clear explanation for this. --Oral Vancomycin 125mg  QID as per order set x 10 days (40 doses) -Continue enteric precautions --Continue Zofran as needed for nausea --Stool lactoferrin positive --Continue pain control home pain regimen  Iron Deficiency anemia: Chronic, stable.  Her hemoglobin is within normal limits. -Continue home ferrous gluconate 324 mg daily  COPD: No acute respiratory distress.  -Albuterol nebs as needed --Fluticasone 2 spray each nare daily  HLD: No acute findings.  Continue home pravastatin 40 mg daily  Sinus tachycardia: No acute findings.  Continue home Lopressor to 12.5 mg twice daily  Osteoporosis: No acute findings.  Continue home vitamin dislocation daily  Diet: Regular Code: Full Fluids: n/a GI PPX: protonix 40mg  dialy VTE PPX: Lovenox  Dispo: Anticipated discharge in  approximately 1 day(s).   Kathi Ludwig, MD 07/05/2017, 4:09 PM Pager: Pager# [phone]

## 2017-07-06 ENCOUNTER — Encounter (HOSPITAL_COMMUNITY): Payer: Self-pay | Admitting: Surgery

## 2017-07-06 ENCOUNTER — Encounter: Payer: Medicare Other | Admitting: Internal Medicine

## 2017-07-06 ENCOUNTER — Telehealth: Payer: Self-pay

## 2017-07-06 DIAGNOSIS — Z933 Colostomy status: Secondary | ICD-10-CM | POA: Diagnosis not present

## 2017-07-06 DIAGNOSIS — A0472 Enterocolitis due to Clostridium difficile, not specified as recurrent: Secondary | ICD-10-CM | POA: Diagnosis not present

## 2017-07-06 DIAGNOSIS — J449 Chronic obstructive pulmonary disease, unspecified: Secondary | ICD-10-CM | POA: Diagnosis not present

## 2017-07-06 DIAGNOSIS — D509 Iron deficiency anemia, unspecified: Secondary | ICD-10-CM | POA: Diagnosis not present

## 2017-07-06 DIAGNOSIS — G894 Chronic pain syndrome: Secondary | ICD-10-CM | POA: Diagnosis not present

## 2017-07-06 MED ORDER — WITCH HAZEL-GLYCERIN EX PADS: MEDICATED_PAD | CUTANEOUS | 12 refills | Status: DC | PRN

## 2017-07-06 MED ORDER — VANCOMYCIN 50 MG/ML ORAL SOLUTION: 125.0000 mg | Freq: Four times a day (QID) | ORAL | 0 refills | Status: DC

## 2017-07-06 NOTE — Discharge Summary (Signed)
Name: [name] Bell MRN: [id number] DOB: 11-14-1945 72 y.o. PCP: [name] Asal, MD  Date of Admission: 07/02/2017  4:20 PM Date of Discharge: 07/06/2017 Attending Physician: [name] Belt, MD  Discharge Diagnosis: 1. Clostridiodes  2. Hartman's pouch mucus rectal discharge  Discharge Medications: Allergies as of 07/06/2017      Reactions   Ibuprofen Other (See Comments)   REACTION: Bleeding ulcers   Nsaids Other (See Comments)   REACTION: Bleeding Ulcer   Latex Itching      Medication List    TAKE these medications   albuterol 108 (90 Base) MCG/ACT inhaler Commonly known as:  PROVENTIL HFA;VENTOLIN HFA Inhale 2 puffs into the lungs every 6 (six) hours as needed for wheezing or shortness of breath.   albuterol (2.5 MG/3ML) 0.083% nebulizer solution Commonly known as:  PROVENTIL Take 3 mLs (2.5 mg total) by nebulization every 6 (six) hours as needed for wheezing or shortness of breath.   alendronate 70 MG tablet Commonly known as:  FOSAMAX Take 1 tablet (70 mg total) by mouth once a week. Take with a full glass of water on an empty stomach.   dextromethorphan-guaiFENesin 30-600 MG 12hr tablet Commonly known as:  MUCINEX DM Take 1 tablet by mouth 2 (two) times daily. What changed:    when to take this  reasons to take this   diphenhydrAMINE 25 MG tablet Commonly known as:  BENADRYL Take 25 mg by mouth every 6 (six) hours as needed for allergies.   docusate sodium 100 MG capsule Commonly known as:  COLACE Take 2 capsules (200 mg total) by mouth daily. What changed:    when to take this  reasons to take this   esomeprazole 40 MG capsule Commonly known as:  NEXIUM Take 1 capsule (40 mg total) by mouth 2 (two) times daily before a meal.   ferrous gluconate 324 MG tablet Commonly known as:  FERGON Take 1 tablet (324 mg total) by mouth daily with breakfast.   fluticasone 50 MCG/ACT nasal spray Commonly known as:  FLONASE Place 2 sprays into both  nostrils daily.   HYDROcodone-acetaminophen 7.5-325 MG tablet Commonly known as:  NORCO Take 1 tablet by mouth every 8 (eight) hours as needed for moderate pain.   metoCLOPramide 10 MG tablet Commonly known as:  REGLAN Take 1 tablet (10 mg total) by mouth every 8 (eight) hours as needed for refractory nausea / vomiting.   metoprolol tartrate 25 MG tablet Commonly known as:  LOPRESSOR Take 0.5 tablets (12.5 mg total) by mouth 2 (two) times daily.   morphine 15 MG 12 hr tablet Commonly known as:  MS CONTIN Take 2 tablets (30 mg total) by mouth every 12 (twelve) hours.   ondansetron 4 MG tablet Commonly known as:  ZOFRAN Take 1 tablet (4 mg total) by mouth every 8 (eight) hours as needed for nausea or vomiting.   polyethylene glycol packet Commonly known as:  MIRALAX / GLYCOLAX Take 17 g by mouth daily as needed. What changed:  reasons to take this   pravastatin 40 MG tablet Commonly known as:  PRAVACHOL Take 1 tablet (40 mg total) by mouth daily.   predniSONE 20 MG tablet Commonly known as:  DELTASONE Take 2 tablets (40 mg total) by mouth daily for 5 days.   vancomycin 50 mg/mL oral solution Commonly known as:  VANCOCIN Take 2.5 mLs (125 mg total) by mouth 4 (four) times daily.   Vitamin D2 2000 units Tabs Take 2,000 Units by mouth  daily.   witch hazel-glycerin pad Commonly known as:  TUCKS Apply topically as needed for itching.      Disposition and follow-up:   [name] was discharged from [facility] in Stable condition.  At the hospital follow up visit please address:  1.  Clostridiodes: C. Diff positive without signs of fulminant disease or clear systemic signs. Aside from nausea, vomiting and diarrhea she had no other clear symptoms. Discharged on Oral vancomycin 125mg  QID for 8 additional days.      Rectal mucus discharge: GI unimpressed and unwilling to scope again. Please assess for resolution and discuss seeing an alternative  specialty if this persists.   2.  Labs / imaging needed at time of follow-up: CBC for WBC count  3.  Pending labs/ test needing follow-up: n/a  Follow-up Appointments: Follow-up Information    [facility], [facility] Follow up.   Specialty:  Home Health Services Contact information: [address] [address] [phone]          Hospital Course by problem list: Clostridiodes: [name] Bucknerat 72 year old female with past medical history notable for COPD, HLD, status post end colostomy with Hartman's pouch,andiron deficiency anemia who presented for evaluation of abdominal pain, diarrhea, nausea, vomiting as well as mucus discharge from the rectum that was thought to be excessive. Initial evaluation was positive forclostridium difficileinfection of the ostium contents. Gastrology was called but stated indignantly that the rectum was viewed during the prior colonoscopy and that they were not sure what we wanted them to do about the rectal discharge as all appeared normal at the time and no biopsies were apparently indicated despite the abnormal discharge. As such, we will treated her C. Diff and she improved symptomatically over time. She was discharged home on oral vancomycin to complete her 10 day course. She will need GI or surgical follow-up if the rectal discharge persists.   Discharge Vitals:   BP (!) 116/58 (BP Location: Left Arm)   Pulse 99   Temp 98 F (36.7 C) (Oral)   Resp 16   Ht 5\' 4"  (1.626 m)   Wt 139 lb 8.8 oz (63.3 kg)   LMP 08/25/1980   SpO2 95%   BMI 23.95 kg/m   Pertinent Labs, Studies, and Procedures:  CBC Latest Ref Rng & Units 07/02/2017 06/08/2017 06/05/2017  WBC 4.0 - 10.5 K/uL 6.6 3.8(L) 5.9  Hemoglobin 12.0 - 15.0 g/dL 12.7 9.9(L) 8.9(L)  Hematocrit 36.0 - 46.0 % 42.0 33.7(L) 30.5(L)  Platelets 150 - 400 K/uL 272 258 259   CMP Latest Ref Rng & Units 07/05/2017 07/03/2017 07/02/2017  Glucose 65 - 99 mg/dL 90 117(H) 130(H)  BUN 6 -  20 mg/dL <5(L) <5(L) <5(L)  Creatinine 0.44 - 1.00 mg/dL 0.68 0.79 0.91  Sodium 135 - 145 mmol/L 144 142 144  Potassium 3.5 - 5.1 mmol/L 3.2(L) 4.3 3.7  Chloride 101 - 111 mmol/L 106 111 107  CO2 22 - 32 mmol/L 30 22 21(L)  Calcium 8.9 - 10.3 mg/dL 7.8(L) 8.6(L) 9.0  Total Protein 6.5 - 8.1 g/dL - 5.1(L) 5.4(L)  Total Bilirubin 0.3 - 1.2 mg/dL - 0.9 0.5  Alkaline Phos 38 - 126 U/L - 135(H) 140(H)  AST 15 - 41 U/L - 62(H) 63(H)  ALT 14 - 54 U/L - 19 17   Discharge Instructions: Discharge Instructions    Call MD for:  difficulty breathing, headache or visual disturbances   Complete by:  As directed  Call MD for:  persistant dizziness or light-headedness   Complete by:  As directed    Call MD for:  redness, tenderness, or signs of infection (pain, swelling, redness, odor or green/yellow discharge around incision site)   Complete by:  As directed    Call MD for:  temperature >100.4   Complete by:  As directed    Diet - low sodium heart healthy   Complete by:  As directed    Increase activity slowly   Complete by:  As directed      Signed: Kathi Ludwig, MD 07/06/2017, 2:25 PM   Pager: Pager# [phone]

## 2017-07-06 NOTE — Progress Notes (Signed)
Subjective: The patient was resting in her bed today upon entering the room. She was not comfortable but much improved and asking to be discharged home. She denied acute complaints and is in agreement with the completing the outpatient medication regimen with Vancomycin.  Objective:  Vital signs in last 24 hours: Vitals:   07/05/17 0520 07/05/17 1334 07/05/17 2100 07/06/17 0652  BP: (!) 113/38 (!) 127/56 (!) 123/43 (!) 116/58  Pulse: 84 79 87 99  Resp:  16 17 16   Temp: 97.6 F (36.4 C) 97.9 F (36.6 C) 98.2 F (36.8 C) 98 F (36.7 C)  TempSrc: Oral Oral Oral Oral  SpO2: 96% 98% 97% 95%  Weight:      Height:       Physical Exam  Constitutional: She is oriented to person, place, and time. She appears well-developed and well-nourished. No distress.  Cardiovascular: Normal rate and regular rhythm.  No murmur heard. Pulmonary/Chest: Effort normal and breath sounds normal. No stridor. No respiratory distress.  Abdominal: Soft. Bowel sounds are normal. There is tenderness.  Musculoskeletal: She exhibits no edema or tenderness.  Neurological: She is alert and oriented to person, place, and time.  Skin: Skin is warm. Capillary refill takes less than 2 seconds. She is not diaphoretic.  Psychiatric: She has a normal mood and affect.  Vitals reviewed.  Assessment/Plan:  Principal Problem:   Diarrhea Active Problems:   Colostomy present (HCC)   Colitis   Hepatic steatosis   C. difficile colitis  Assessment: [name] Bucknerat 72 year old female with past medical history notable for COPD, HLD, status post end colostomy with Hartman's pouch,andiron deficiency anemia who presented for evaluation of abdominal pain, diarrhea, nausea, vomiting. Initial evaluation was positive forclostridium difficileinfection of the ostium contents. Gastrology was called but stated indignantly that the rectum was viewed during the prior colonoscopy and that they were not sure what we wanted them to do  about the rectal discharge as all appeared normal at the time and no biopsies were apparently indicated despite the abnormal discharge. As such, we will treat her C. Diff and monitor for potential improvement although I not convinced this has anything to do with her abnormal rectal discharge. She was discharged home on oral vancomycin to complete her 10 day course.   Plan: Abdominal pain, diarrhea w/ positive C. Diff: As above.The patient's constellation ofsymptoms appearinconsistent with a diagnosis ofClostridium difficile given her lack of a leukocytosis and other classic findings. Aside from loose watery stool I would not be as convinced that this is a standard C. Diff infection as Dr. Paulita Fujita appears to be. I am also not convinced this is related to the excessive mucus production from the Hartman's pouch but do not have a clear explanation for this. However, given the lack of other defining source I will treat her C diff and follow her in the clinic.  --Oral Vancomycin 125mg  QID as per order set x 10 days (40 doses) -Continue enteric precautions --Continue Zofran as needed for nausea --Stool lactoferrin positive --Continuepain control home pain regimen  Iron Deficiency anemia: Chronic, stable. Her hemoglobin is within normal limits. -Continue home ferrous gluconate 324 mg daily  COPD: No acute respiratory distress.  -Albuterol nebs as needed --Fluticasone 2 spray each nare daily  HLD: No acute findings. Continue home pravastatin 40 mg daily  Sinus tachycardia: No acute findings. Continue home Lopressor to 12.5 mg twice daily  Osteoporosis: No acute findings. Continue home vitamin dislocation daily  Diet: Regular Code: Full Fluids: n/a  GI PPX: protonix 40mg  dialy VTE PPX: Lovenox Dispo: Anticipated discharge in approximately 0 day(s).   Kathi Ludwig, MD 07/06/2017, 6:58 AM Pager: Pager# [phone]

## 2017-07-06 NOTE — Progress Notes (Signed)
Patient discharged to home with instructions and prescription. 

## 2017-07-06 NOTE — Care Management Note (Addendum)
Case Management Note  Patient Details  Name: [name] Bell MRN: [id number] Date of Birth: Sep 27, 1945  Subjective/Objective:                    Action/Plan:  Faxed home health orders and face to face to Maudie Mercury at [facility] ,fax to [phone]. Patient 's friend can provide transportation home at discharge.   Expected Discharge Date:  07/06/17               Expected Discharge Plan:  [place]  In-House Referral:  NA  Discharge planning Services  CM Consult, Medication Assistance  Post Acute Care Choice:  Home Health Choice offered to:  Patient  DME Arranged:  N/A DME Agency:  NA  HH Arranged:  RN, PT [place] Agency:  Other - See comment  Status of Service:  Completed, signed off  If discussed at [place] of Stay Meetings, dates discussed:    Additional Comments:  Marilu Favre, RN 07/06/2017, 8:30 AM

## 2017-07-06 NOTE — Care Management (Signed)
ORAL VANCOMYCIN   125 MG QID FOR 8 DAYS  COVER- YES  CO-PAY- $ 1.25  TIER- NO  PRIOR APPROVAL- NO   PREFERRED PHARMACY : YES  [facility]   PATIENT ALSO HAS : MEDICAID OF [place]                    EFF- DATE 10-26-2016                    CO-PAY- $ 3.80 FOR EACH RX      Magdalen Spatz RN BSN [phone]

## 2017-07-06 NOTE — Telephone Encounter (Signed)
Hospital TOC per dr Berline Lopes, discharge 07/06/2017, appt 07/12/2017.

## 2017-07-07 ENCOUNTER — Telehealth: Payer: Self-pay | Admitting: *Deleted

## 2017-07-07 DIAGNOSIS — I10 Essential (primary) hypertension: Secondary | ICD-10-CM | POA: Diagnosis not present

## 2017-07-07 DIAGNOSIS — H548 Legal blindness, as defined in USA: Secondary | ICD-10-CM | POA: Diagnosis not present

## 2017-07-07 DIAGNOSIS — K529 Noninfective gastroenteritis and colitis, unspecified: Secondary | ICD-10-CM | POA: Diagnosis not present

## 2017-07-07 DIAGNOSIS — J4520 Mild intermittent asthma, uncomplicated: Secondary | ICD-10-CM | POA: Diagnosis not present

## 2017-07-07 LAB — CULTURE, BLOOD (ROUTINE X 2)
CULTURE: NO GROWTH
CULTURE: NO GROWTH
Special Requests: ADEQUATE

## 2017-07-07 MED ORDER — VANCOMYCIN HCL 125 MG PO CAPS: 125.0000 mg | ORAL_CAPSULE | Freq: Four times a day (QID) | ORAL | 0 refills | Status: AC

## 2017-07-07 NOTE — Telephone Encounter (Signed)
Spoke to pt, she stated she is out doing errands and getting medicine filled, please call back later

## 2017-07-07 NOTE — Telephone Encounter (Signed)
Pt calls and states she could not afford vanc liquid, needs [facility]

## 2017-07-08 NOTE — Telephone Encounter (Signed)
Attempted to call, ph rang a number of times and then abruptly cut off

## 2017-07-09 DIAGNOSIS — Z933 Colostomy status: Secondary | ICD-10-CM | POA: Diagnosis not present

## 2017-07-12 ENCOUNTER — Ambulatory Visit (INDEPENDENT_AMBULATORY_CARE_PROVIDER_SITE_OTHER): Payer: Medicare Other | Admitting: Internal Medicine

## 2017-07-12 ENCOUNTER — Other Ambulatory Visit: Payer: Self-pay

## 2017-07-12 VITALS — BP 110/47 | HR 80 | Temp 98.2°F | Ht 64.0 in | Wt 130.5 lb

## 2017-07-12 DIAGNOSIS — Z933 Colostomy status: Secondary | ICD-10-CM | POA: Diagnosis not present

## 2017-07-12 DIAGNOSIS — G8929 Other chronic pain: Secondary | ICD-10-CM | POA: Diagnosis not present

## 2017-07-12 DIAGNOSIS — K469 Unspecified abdominal hernia without obstruction or gangrene: Secondary | ICD-10-CM

## 2017-07-12 DIAGNOSIS — D509 Iron deficiency anemia, unspecified: Secondary | ICD-10-CM | POA: Diagnosis not present

## 2017-07-12 DIAGNOSIS — Z8719 Personal history of other diseases of the digestive system: Secondary | ICD-10-CM

## 2017-07-12 DIAGNOSIS — Z79891 Long term (current) use of opiate analgesic: Secondary | ICD-10-CM | POA: Diagnosis not present

## 2017-07-12 DIAGNOSIS — K529 Noninfective gastroenteritis and colitis, unspecified: Secondary | ICD-10-CM | POA: Diagnosis not present

## 2017-07-12 DIAGNOSIS — M810 Age-related osteoporosis without current pathological fracture: Secondary | ICD-10-CM | POA: Diagnosis not present

## 2017-07-12 DIAGNOSIS — Z79899 Other long term (current) drug therapy: Secondary | ICD-10-CM | POA: Diagnosis not present

## 2017-07-12 DIAGNOSIS — J449 Chronic obstructive pulmonary disease, unspecified: Secondary | ICD-10-CM | POA: Diagnosis not present

## 2017-07-12 NOTE — Patient Instructions (Addendum)
Nice to see you again [name] Bell.   Continue with the vancomycin capsules and hopefully you continue to improve. It will also be important to keep your appointment with the GI doctor as well. I have put in an order for the ostomy supplies and adult diaper to help decrease the cost of these supplies.

## 2017-07-12 NOTE — Assessment & Plan Note (Addendum)
She has been admitted twice over the past 2 months for abdominal pain and increased ostomy output.  Most recently, she had equivocal testing for C. difficile and was started on treatment with oral vancomycin which she has been adherent to.  Still with increased ostomy output and crampy abdominal pain, but improved and she is feeling better.  She is still taking stool softeners due to fear of constipation.  She was counseled that holding her Colace and MiraLAX may hasten improvement in her ostomy output.  She was agreeable and will hold these medications for the next several days until her output has normalized.  On review of discharge labs, slightly hypokalemic and we will repeat BMP to ensure this is normalized. Orders also placed for ostomy supplies and adult diaper. She has follow up with GI scheduled for 6/20, encouraged to keep appt.   --Cont Vancomycin course (10 day total prescribed at dc) --Hold miralax, docusate --BMP --Keep GI f/u

## 2017-07-12 NOTE — Progress Notes (Signed)
CC: Hospital follow up colitis   HPI:  [name] is a 72 y.o. F with a past medical history of chronic pain, permanent colostomy d/t prior ulcer perforation, COPD, osteoporosis who presents to the clinic for hospital follow up.   Colitis: Patient was recently admitted 6/7-6/11 with abdominal pain and increased colostomy output, rectal discharge.  Imaging showed postsurgical changes and interval improvement in colonic wall thickening.  She had equivocal C. difficile testing and was treated empirically with oral vancomycin for a 10-day course.  She had a similar presentation with admission 5/9- 5/14 with GI evaluation with EGD and colonoscopy at that time. EGD noted esophageal and pyloric stenoses which were successfully dilated. Biopsy taken with colonoscopy which was benign. Since her most recent discharge, she reports initial difficulty obtaining vancomycin capsules but this was resolved and she has been taking appropriately.  She reports her intermittent cramping abdominal pain is improved.  Still having watery ostomy output containing bag 8-9 times per day.  However, she feels the odor of the output has improved.  She is also still taking her stool softeners which she takes along with chronic opioids to avoid constipation.  She also notes improved but continued rectal discharge.  Inquires about orders for adult diapers and ostomy supplies per advice of a home health nurse.  Iron Deficiency Anemia: Labs during May hospitalization noted a new iron deficiency anemia for which she was started on oral Fe supplementation, reports adherence.   Past Medical History:  Diagnosis Date  . Acute sinusitis 06/30/2011  . Anxiety   . Basal cell carcinoma    "left cheek"  . Bleeding ulcer   . Blind in both eyes   . Chronic back pain   . Degenerative disk disease    This is interscapular, mild compression frx of T12 superior endplate with Schmorl's node. Seen on CT in 2/08  . Depression   . Duodenal  ulcer 11/08   With hemorrhage and obstruction  . History of blood transfusion    "related to bleeding from intestines and vomiting blood"  . Ischemic colitis ([place]) 07/30/2011   2009 by endoscopy   . Macular degeneration, wet ([place])   . Osteomyelitis, jaw acute 11/08   Started while in the hospital abcess showed GNR . SP debridement by Dr. Lilli Few,  Priscella Mann S Bovis sp 4  weeks of Pen V started on 05/19/07  with additional 2 weeks in 07/04/07  . Perforated bowel [facility])    surgery july 2013  . Superior mesenteric artery syndrome ([place]) 11/08   sp dilation by Dr. Watt Climes, Pt may require bowel resetion if sx recur   Review of Systems:  Review of Systems  Constitutional: Negative for fever and malaise/fatigue.  Respiratory: Negative for shortness of breath.   Gastrointestinal: Positive for abdominal pain and diarrhea. Negative for constipation and vomiting.     Physical Exam:  Vitals:   07/12/17 0947  BP: (!) 110/47  Pulse: 80  Temp: 98.2 F (36.8 C)  TempSrc: Oral  SpO2: 95%  Weight: 130 lb 8 oz (59.2 kg)  Height: 5\' 4"  (1.626 m)   General: Chronically ill appearing elderly female, no acute distress HEENT: Moist mucus membranes, decreased vision  CV: RRR, no murmur appreciated  Resp: Clear breath sounds bilaterally without wheezing, normal work of breathing, no distress  Abd: Soft, +BS, mild diffuse TTP, chronic and stable hernia beneath site of ostomy. Pink ostomy, bag in place with dark brown watery output, no blood visualized  Extr: No LE edema  Neuro: Alert and oriented x3, normal gait with walker  Skin: Warm, dry      Assessment & Plan:   See Encounters Tab for problem based charting.  Patient discussed with Dr. Daryll Drown

## 2017-07-13 LAB — BMP8+ANION GAP
Anion Gap: 16 mmol/L (ref 10.0–18.0)
BUN / CREAT RATIO: 9 — AB (ref 12–28)
BUN: 5 mg/dL — AB (ref 8–27)
CO2: 25 mmol/L (ref 20–29)
Calcium: 9 mg/dL (ref 8.7–10.3)
Chloride: 104 mmol/L (ref 96–106)
Creatinine, Ser: 0.56 mg/dL — ABNORMAL LOW (ref 0.57–1.00)
GFR, EST AFRICAN AMERICAN: 107 mL/min/{1.73_m2} (ref >59)
GFR, EST NON AFRICAN AMERICAN: 93 mL/min/{1.73_m2} (ref >59)
Glucose: 92 mg/dL (ref 65–99)
Potassium: 3.8 mmol/L (ref 3.5–5.2)
Sodium: 145 mmol/L — ABNORMAL HIGH (ref 134–144)

## 2017-07-13 NOTE — Progress Notes (Signed)
Internal Medicine Clinic Attending  Case discussed with Dr. Harden at the time of the visit.  We reviewed the resident's history and exam and pertinent patient test results.  I agree with the assessment, diagnosis, and plan of care documented in the resident's note.  

## 2017-07-13 NOTE — Addendum Note (Signed)
Addended by: Gilles Chiquito B on: 07/13/2017 04:48 PM   Modules accepted: Level of Service

## 2017-07-13 NOTE — Telephone Encounter (Signed)
Called again, rang and rang, then cutoff once again

## 2017-07-14 DIAGNOSIS — J4520 Mild intermittent asthma, uncomplicated: Secondary | ICD-10-CM | POA: Diagnosis not present

## 2017-07-14 DIAGNOSIS — I10 Essential (primary) hypertension: Secondary | ICD-10-CM | POA: Diagnosis not present

## 2017-07-14 DIAGNOSIS — K529 Noninfective gastroenteritis and colitis, unspecified: Secondary | ICD-10-CM | POA: Diagnosis not present

## 2017-07-14 DIAGNOSIS — H548 Legal blindness, as defined in USA: Secondary | ICD-10-CM | POA: Diagnosis not present

## 2017-07-19 DIAGNOSIS — A09 Infectious gastroenteritis and colitis, unspecified: Secondary | ICD-10-CM | POA: Diagnosis not present

## 2017-07-21 DIAGNOSIS — K529 Noninfective gastroenteritis and colitis, unspecified: Secondary | ICD-10-CM | POA: Diagnosis not present

## 2017-07-21 DIAGNOSIS — J4520 Mild intermittent asthma, uncomplicated: Secondary | ICD-10-CM | POA: Diagnosis not present

## 2017-07-21 DIAGNOSIS — H548 Legal blindness, as defined in USA: Secondary | ICD-10-CM | POA: Diagnosis not present

## 2017-07-21 DIAGNOSIS — I10 Essential (primary) hypertension: Secondary | ICD-10-CM | POA: Diagnosis not present

## 2017-07-27 ENCOUNTER — Other Ambulatory Visit: Payer: Self-pay

## 2017-07-27 ENCOUNTER — Ambulatory Visit (INDEPENDENT_AMBULATORY_CARE_PROVIDER_SITE_OTHER): Payer: Medicare Other | Admitting: Internal Medicine

## 2017-07-27 VITALS — BP 118/55 | HR 101 | Temp 97.6°F | Ht 64.0 in | Wt 126.4 lb

## 2017-07-27 DIAGNOSIS — K469 Unspecified abdominal hernia without obstruction or gangrene: Secondary | ICD-10-CM | POA: Diagnosis not present

## 2017-07-27 DIAGNOSIS — G8929 Other chronic pain: Secondary | ICD-10-CM

## 2017-07-27 DIAGNOSIS — R112 Nausea with vomiting, unspecified: Secondary | ICD-10-CM | POA: Diagnosis not present

## 2017-07-27 DIAGNOSIS — K269 Duodenal ulcer, unspecified as acute or chronic, without hemorrhage or perforation: Secondary | ICD-10-CM | POA: Diagnosis not present

## 2017-07-27 DIAGNOSIS — J449 Chronic obstructive pulmonary disease, unspecified: Secondary | ICD-10-CM | POA: Diagnosis not present

## 2017-07-27 DIAGNOSIS — M810 Age-related osteoporosis without current pathological fracture: Secondary | ICD-10-CM

## 2017-07-27 DIAGNOSIS — Z933 Colostomy status: Secondary | ICD-10-CM | POA: Diagnosis not present

## 2017-07-27 DIAGNOSIS — A0472 Enterocolitis due to Clostridium difficile, not specified as recurrent: Secondary | ICD-10-CM | POA: Diagnosis not present

## 2017-07-27 MED ORDER — ESOMEPRAZOLE MAGNESIUM 40 MG PO CPDR: 40.0000 mg | DELAYED_RELEASE_CAPSULE | Freq: Every day | ORAL | 1 refills | Status: DC

## 2017-07-27 NOTE — Patient Instructions (Addendum)
Dear [name] Bell,  You came to [place] with complaints of diarrhea and nausea. We recommend you come down on your dose of Nexium from twice a day to once a day and to take over-the-counter anti-nausea medication. You also mentioned that you require refills of your colostomy equipment so we have ordered replacements for those as well. Please let us know if you experience any bloody stool or fever or chills  Thank you for choosing [facility]

## 2017-07-27 NOTE — Assessment & Plan Note (Addendum)
-   Most recent hospital admission showed C.diff + on PCR and Antigen - Was discharged with 10 day course of vanc - GI follow up visit, was told to continue additional course of vanc - Complain of increased colostomy output and mentions anxiety concerning fear of leakage - Complains of severe nausea and being out of prescription anti-nausea medication - Recommend calling GI to confirm vanc dosing   - Recommend trial of OTC anti-nausea medication - Will try to obtain records from GI to coordinate care  - Put in orders for replacement Colostomy supplies and adult diapers

## 2017-07-27 NOTE — Progress Notes (Signed)
CC: Follow up visit after hospital admission for C.Difficile colitis  HPI: [name] is a 72 y.o. F w/ PMH of Colitis, permanent colostomy bag, duodenal ulcer, chronic pain, COPD and osteoporosis who presents to [facility] for diarrhea and nausea as well as med refill. She had a recent admission for colitis with abdominal pain and diarrhea which was diagnosed as C.diffle infection and was put on 10 day course of vancomycin. She had a post-discharge appointment at [facility] where she was taken off miralax, docusate and had her ostomy supplies and adult diaper ordered, but those orders did not go through according to her home nurse. She had a GI appointment on 07/15/17 where she was told she will continue the vanc for a second round. She was unable to clarify for how long duration. She denies any fevers, chills, hematochezia but does complain of continued diarrhea and nausea.   Past Medical History:  Diagnosis Date  . Acute sinusitis 06/30/2011  . Anxiety   . Basal cell carcinoma    "left cheek"  . Bleeding ulcer   . Blind in both eyes   . Chronic back pain   . Degenerative disk disease    This is interscapular, mild compression frx of T12 superior endplate with Schmorl's node. Seen on CT in 2/08  . Depression   . Duodenal ulcer 11/08   With hemorrhage and obstruction  . History of blood transfusion    "related to bleeding from intestines and vomiting blood"  . Ischemic colitis ([place]) 07/30/2011   2009 by endoscopy   . Macular degeneration, wet ([place])   . Osteomyelitis, jaw acute 11/08   Started while in the hospital abcess showed GNR . SP debridement by Dr. Lilli Few,  Priscella Mann S Bovis sp 4  weeks of Pen V started on 05/19/07  with additional 2 weeks in 07/04/07  . Perforated bowel [facility])    surgery july 2013  . Superior mesenteric artery syndrome ([place]) 11/08   sp dilation by Dr. Watt Climes, Pt may require bowel resetion if sx recur    Review of Systems: Review of Systems  Constitutional: Negative for  chills, fever, malaise/fatigue and weight loss.  Respiratory: Negative for sputum production, shortness of breath and wheezing.   Cardiovascular: Negative for chest pain, palpitations and orthopnea.  Gastrointestinal: Positive for abdominal pain (intermittent cramping), diarrhea, nausea and vomiting. Negative for blood in stool, constipation and heartburn.  Genitourinary: Negative for dysuria, frequency and urgency.       Continuous mucus production from vagina and rectum   Musculoskeletal: Positive for back pain. Negative for falls and myalgias.  Skin: Negative for itching and rash.     Physical Exam: Vitals:   07/27/17 1506  BP: (!) 118/55  Pulse: (!) 101  Temp: 97.6 F (36.4 C)  TempSrc: Oral  SpO2: 96%  Weight: 126 lb 6.4 oz (57.3 kg)  Height: 5\' 4"  (1.626 m)    Physical Exam  Constitutional: She is oriented to person, place, and time. She appears well-developed and well-nourished. No distress.  Cardiovascular: Normal rate, regular rhythm, normal heart sounds and intact distal pulses.  Respiratory: Effort normal and breath sounds normal. No respiratory distress. She has no wheezes.  GI: Soft. Bowel sounds are normal. She exhibits mass (Chronic, stable hernia on LLQ). There is tenderness (mild tenderness to palpation). There is no guarding.  Colostomy bag in place w/o leakage filled with dark brown watery stool, no frank blood visualized  Musculoskeletal: Normal range of motion. She exhibits no  edema or tenderness.  Neurological: She is alert and oriented to person, place, and time. She has normal reflexes.  Skin: Skin is warm and dry. No rash noted. No erythema.     Assessment & Plan:   See Encounters Tab for problem based charting.  Patient seen with Dr. Angelia Mould   -Gilberto Better, PGY1

## 2017-07-28 DIAGNOSIS — I10 Essential (primary) hypertension: Secondary | ICD-10-CM | POA: Diagnosis not present

## 2017-07-28 DIAGNOSIS — K529 Noninfective gastroenteritis and colitis, unspecified: Secondary | ICD-10-CM | POA: Diagnosis not present

## 2017-07-28 DIAGNOSIS — H548 Legal blindness, as defined in USA: Secondary | ICD-10-CM | POA: Diagnosis not present

## 2017-07-28 DIAGNOSIS — J4520 Mild intermittent asthma, uncomplicated: Secondary | ICD-10-CM | POA: Diagnosis not present

## 2017-07-28 NOTE — Progress Notes (Signed)
Internal Medicine Clinic Attending  I saw and evaluated the patient.  I personally confirmed the key portions of the history and exam documented by Dr. Truman Hayward and I reviewed pertinent patient test results.  The assessment, diagnosis, and plan were formulated together and I agree with the documentation in the resident's note. In review of her history she seems to have been started on BID PPI for duodenal ulcer, her recent EGD in may shows no signs of ulceration.  Given her issues with C diff colitis as well as hypokalemia and hypomagnesemia in the past I we will reduce her PPI to once daily.  We are also working on obtaining last GI note to ensure there is no other reason for BID PPI that we are unaware of.

## 2017-08-02 ENCOUNTER — Other Ambulatory Visit: Payer: Self-pay | Admitting: Internal Medicine

## 2017-08-02 DIAGNOSIS — Z79891 Long term (current) use of opiate analgesic: Secondary | ICD-10-CM

## 2017-08-02 MED ORDER — MORPHINE SULFATE ER 15 MG PO TBCR: 30.0000 mg | EXTENDED_RELEASE_TABLET | Freq: Two times a day (BID) | ORAL | 0 refills | Status: DC

## 2017-08-02 MED ORDER — HYDROCODONE-ACETAMINOPHEN 7.5-325 MG PO TABS: 1.0000 | ORAL_TABLET | Freq: Three times a day (TID) | ORAL | 0 refills | Status: DC | PRN

## 2017-08-02 NOTE — Telephone Encounter (Signed)
Needs refills morphine 15 mg and hydrocodone at [place]- asked pharmacy no more refills. Pt contact # [phone]

## 2017-08-02 NOTE — Telephone Encounter (Signed)
Refill X 1 sent.  Her new PCP should be trained for refills at time of next refill.

## 2017-08-04 DIAGNOSIS — K529 Noninfective gastroenteritis and colitis, unspecified: Secondary | ICD-10-CM | POA: Diagnosis not present

## 2017-08-04 DIAGNOSIS — J4520 Mild intermittent asthma, uncomplicated: Secondary | ICD-10-CM | POA: Diagnosis not present

## 2017-08-04 DIAGNOSIS — H548 Legal blindness, as defined in USA: Secondary | ICD-10-CM | POA: Diagnosis not present

## 2017-08-04 DIAGNOSIS — I10 Essential (primary) hypertension: Secondary | ICD-10-CM | POA: Diagnosis not present

## 2017-08-16 ENCOUNTER — Encounter: Payer: Self-pay | Admitting: *Deleted

## 2017-08-26 ENCOUNTER — Other Ambulatory Visit: Payer: Self-pay

## 2017-08-30 ENCOUNTER — Other Ambulatory Visit: Payer: Self-pay | Admitting: *Deleted

## 2017-08-30 DIAGNOSIS — A09 Infectious gastroenteritis and colitis, unspecified: Secondary | ICD-10-CM | POA: Diagnosis not present

## 2017-08-30 DIAGNOSIS — R1084 Generalized abdominal pain: Secondary | ICD-10-CM | POA: Diagnosis not present

## 2017-08-30 NOTE — Patient Outreach (Signed)
[facility]) Care Management  08/30/2017  [name] Bell 02-Mar-1945 [id number]   Telephone Screen  Referral Date: 08/26/17 Referral Source: EMMI prevent call  Referral Reason: Previously outreached by [place] but closed due to not answering calls, Legally blind and lives alone, Agreed to informed her aide of [facility] services ands plans to be available for calls Insurance: united health care medicare Outreach attempt #1 an the home/mobile number listed in Epic  No answer. [facility] RN CM left HIPAA compliant voicemail message along with CM's contact info.    Plan: [facility] RN CM sent an unsuccessful outreach letter and scheduled this patient for another call attempt within 4 business days   Kimberly L. Lavina Hamman, RN, BSN, CCM [facility] Telephonic Care Management Care Coordinator Direct number [phone]  Main [facility] number [phone] Fax number [phone]

## 2017-08-31 ENCOUNTER — Other Ambulatory Visit: Payer: Self-pay | Admitting: *Deleted

## 2017-08-31 NOTE — Addendum Note (Signed)
Addended by: Barbaraann Faster on: 08/31/2017 03:48 PM   Modules accepted: Orders

## 2017-08-31 NOTE — Patient Outreach (Signed)
[facility]) Care Management  08/31/2017  [name] Bell 10/29/1945 [id number]   Telephone Screen  Referral Date: 08/26/17 Referral Source: EMMI prevent call  Referral Reason: Previously outreached by [place] but closed due to not answering calls, Legally blind and lives alone, Agreed to informed her aide of [facility] services ands plans to be available for calls Insurance: united health care medicare  [name] Bell returned a call to [facility] RN CM after a call attempt was unsuccessful today to her number CM spoke with [place] who provide a message to [name] Bell once she arrived to see [name] Bell after 1:30 pm  Patient is able to verify HIPAA Reviewed and addressed referral to [facility] with patient She denies need for assist for medications but interested in advance directives, transportation and "house keeping" CM discussed with [name] Bell that [facility] staff does not assist with house keeping and possible assist from personal care aides may be out of pocket expense She voiced understanding    Social: [name] Bell is Legally blind and lives in [place] apartments. She reports her neighbors frequently check on her and Clearnce Hasten assists also She need assist with all of her ADLs and iADLs She reports using a walker to get around.  She reports purchasing this walker about 8-9 years ago. Cm discussed Medicare DME guidelines She is interested in getting a new walker  Conditions: Colitis, permanent colostomy bag, duodenal ulcer, chronic pain, COPD and osteoporosis   Medications: She denies concerns with taking medications as prescribed, affording medications, side effects of medications and questions about medications  Appointments: seen primary MD last on 07/27/17  Advance Directives: She states she does not have advance directives and is interested in getting assistance   Consent: [place] reviewed [facility] services with patient. Patient gave verbal consent for services [facility] for advance  directives, transportation, "house keeping".  Plan: [facility] RN CM referred [name] Bell to [facility] for assist with advance directives, DME, possible personal care services for housekeeping and transportation     Rosman. Lavina Hamman, RN, BSN, CCM [facility] Telephonic Care Management Care Coordinator Direct number [phone]  Main [facility] number [phone] Fax number [phone]

## 2017-08-31 NOTE — Patient Outreach (Signed)
[facility]) Care Management  08/31/2017  [name] Bell 08-26-1945 [id number]   Telephone Screen  Referral Date: 08/26/17 Referral Source: EMMI prevent call  Referral Reason: Previously outreached by [place] but closed due to not answering calls, Legally blind and lives alone, Agreed to informed her aide of [facility] services ands plans to be available for calls Insurance: united health care medicare   Outreach attempt #1 at the home/mobile number listed in Epic  No answer at [phone] [facility] RN CM left HIPAA compliant voicemail message along with CM's contact info at [phone] for [place] returned a call and reports she will attempt to have the pt return a call to CM today  Plan: [facility] RN CM scheduled this patient for another call attempt within 4 business days   Leodan Bolyard L. Lavina Hamman, RN, BSN, CCM [facility] Telephonic Care Management Care Coordinator Direct number [phone]  Main [facility] number [phone] Fax number [phone]

## 2017-09-03 ENCOUNTER — Other Ambulatory Visit: Payer: Self-pay

## 2017-09-03 DIAGNOSIS — Z79891 Long term (current) use of opiate analgesic: Secondary | ICD-10-CM

## 2017-09-03 MED ORDER — HYDROCODONE-ACETAMINOPHEN 7.5-325 MG PO TABS: 1.0000 | ORAL_TABLET | Freq: Three times a day (TID) | ORAL | 0 refills | Status: DC | PRN

## 2017-09-03 MED ORDER — MORPHINE SULFATE ER 15 MG PO TBCR: 30.0000 mg | EXTENDED_RELEASE_TABLET | Freq: Two times a day (BID) | ORAL | 0 refills | Status: DC

## 2017-09-03 NOTE — Telephone Encounter (Signed)
morphine (MS CONTIN) 15 MG 12 hr tablet   HYDROcodone-acetaminophen (NORCO) 7.5-325 MG tablet, [facility] @  [id number] [place] [place] [facility] [place] [phone] (Phone) [phone] (Fax)

## 2017-09-03 NOTE — Telephone Encounter (Signed)
I can only sign paper scripts of opioid medications. She would need another appointment to see me in person. Could we make an appointment for her?

## 2017-09-03 NOTE — Addendum Note (Signed)
Addended by: Velora Heckler on: 09/03/2017 01:40 PM   Modules accepted: Orders

## 2017-09-03 NOTE — Patient Outreach (Addendum)
[facility]) Care Management  09/03/2017  [name] Bell 07/21/1945 [id number]   Initial outreach to [name] Bell regarding social work referral for transportation and housekeeping resources, and the need for DME.  [name] Bell reported that she is eligible for SCAT but has had a couple of bad experiences with the service and prefers not to utilize it. BSW informed her that she likely has transportation benefits through [employer] as well as Medicaid. BSW talked with her about Armed forces technical officer and [employer].  BSW submitted referral to [employer] and informed her that there is currently a wait list.  She will be contacted by them when they have availability.  BSW mailed the application for [employer] to her as she must sign it. She said that her aide can assist her with completing and mailing in the application.   BSW informed [name] Bell that private pay is the only option for housekeeping services and provided her with contact information for a local agency. BSW will follow up next week to ensure receipt of [employer] documentation.  Ronn Melena, BSW Social Worker [phone]

## 2017-09-10 ENCOUNTER — Ambulatory Visit: Payer: Self-pay

## 2017-09-10 ENCOUNTER — Other Ambulatory Visit: Payer: Self-pay

## 2017-09-10 NOTE — Patient Outreach (Signed)
[facility]) Care Management  09/10/2017  [name] Bell 20-Jul-1945 [id number]   Follow up call to [name] to ensure receipt of Programmer, applications information that was mailed last week.  She reported that, as of Wednesday, she had not received it but has not checked her mail since then.  She will have her aide check in today.  BSW will follow up again next week and resend it if she has not received it.   Ronn Melena, BSW Social Worker [phone]

## 2017-09-13 ENCOUNTER — Other Ambulatory Visit: Payer: Self-pay

## 2017-09-13 NOTE — Patient Outreach (Signed)
[facility]) Care Management  09/13/2017  LIZZETE GOUGH 10-22-1945 [id number]   Follow up call to Ms. Mckendree to ensure receipt of transportation resources mailed.  Ms.  Nason reported that her aide has not yet checked the mail so she is ensure if she received resources.  BSW will follow up again by the end of the week.    Ronn Melena, BSW Social Worker [phone]

## 2017-09-16 ENCOUNTER — Other Ambulatory Visit: Payer: Self-pay

## 2017-09-16 ENCOUNTER — Ambulatory Visit: Payer: Self-pay

## 2017-09-16 NOTE — Patient Outreach (Signed)
[facility]) Care Management  09/16/2017  [name] Bell 11/07/1945 [id number]   Attempted to outreach to ensure receipt of transportation resources.  No answer and no option to leave voicemail message.  Will attempt again within four business days.  Ronn Melena, BSW Social Worker [phone]

## 2017-09-20 ENCOUNTER — Other Ambulatory Visit: Payer: Self-pay

## 2017-09-20 ENCOUNTER — Ambulatory Visit: Payer: Self-pay

## 2017-09-20 NOTE — Patient Outreach (Signed)
[facility]) Care Management  09/20/2017  SEHAJ KOLDEN 11-05-1945 [id number]   Second outreach attempt to ensure receipt of transportation resources.  No answer and no option to leave voicemail message.  Will make final follow up attempt within four business days.  Ronn Melena, BSW Social Worker [phone]

## 2017-09-23 ENCOUNTER — Ambulatory Visit: Payer: Self-pay

## 2017-09-23 ENCOUNTER — Other Ambulatory Visit: Payer: Self-pay

## 2017-09-23 NOTE — Patient Outreach (Signed)
[facility]) Care Management  09/23/2017  LANE KJOS 07/28/1945 [id number]   BSW made final attempt to follow up with Ms. Raine to ensure receipt of transportation resources mailed. BSW mailed contact information/instructions  for Primary school teacher.  BSW left voicemail message.  BSW is closing case at this time.    Ronn Melena, BSW Social Worker [phone]

## 2017-09-29 ENCOUNTER — Other Ambulatory Visit: Payer: Self-pay | Admitting: Internal Medicine

## 2017-09-29 DIAGNOSIS — D508 Other iron deficiency anemias: Secondary | ICD-10-CM

## 2017-10-04 ENCOUNTER — Other Ambulatory Visit: Payer: Self-pay | Admitting: Internal Medicine

## 2017-10-04 DIAGNOSIS — Z79891 Long term (current) use of opiate analgesic: Secondary | ICD-10-CM

## 2017-10-04 NOTE — Telephone Encounter (Signed)
Needs refill on HYDROcodone-acetaminophen (NORCO) 7.5-325 MG tablet, morphine (MS CONTIN) 15 MG 12 hr [facility] [place]; pt contact   [phone]

## 2017-10-06 NOTE — Telephone Encounter (Signed)
She needs to be evaluated again with a repeat UDS and resigning of pain contract. Whoever sees her on 9/13 should refill if deemed appropriate or she can make an appointment with me if the provider at [facility] does not feel comfortable doing it.

## 2017-10-06 NOTE — Telephone Encounter (Signed)
Last rx written  09/03/17. Last OV 07/27/17. UDS  10/27/16. Next OV  9/13 in [place].

## 2017-10-07 NOTE — Telephone Encounter (Signed)
Please refill or deny refill. Thanks

## 2017-10-08 ENCOUNTER — Encounter: Payer: Self-pay | Admitting: Internal Medicine

## 2017-10-08 ENCOUNTER — Ambulatory Visit (INDEPENDENT_AMBULATORY_CARE_PROVIDER_SITE_OTHER): Payer: Medicare Other | Admitting: Internal Medicine

## 2017-10-08 ENCOUNTER — Other Ambulatory Visit: Payer: Self-pay

## 2017-10-08 DIAGNOSIS — R110 Nausea: Secondary | ICD-10-CM

## 2017-10-08 DIAGNOSIS — G8929 Other chronic pain: Secondary | ICD-10-CM | POA: Diagnosis not present

## 2017-10-08 DIAGNOSIS — Z8619 Personal history of other infectious and parasitic diseases: Secondary | ICD-10-CM

## 2017-10-08 DIAGNOSIS — Z79891 Long term (current) use of opiate analgesic: Secondary | ICD-10-CM

## 2017-10-08 DIAGNOSIS — Z23 Encounter for immunization: Secondary | ICD-10-CM

## 2017-10-08 DIAGNOSIS — R000 Tachycardia, unspecified: Secondary | ICD-10-CM

## 2017-10-08 DIAGNOSIS — M549 Dorsalgia, unspecified: Secondary | ICD-10-CM | POA: Diagnosis not present

## 2017-10-08 DIAGNOSIS — R197 Diarrhea, unspecified: Secondary | ICD-10-CM

## 2017-10-08 DIAGNOSIS — H439 Unspecified disorder of vitreous body: Secondary | ICD-10-CM

## 2017-10-08 DIAGNOSIS — Z933 Colostomy status: Secondary | ICD-10-CM

## 2017-10-08 DIAGNOSIS — Z79899 Other long term (current) drug therapy: Secondary | ICD-10-CM

## 2017-10-08 MED ORDER — HYDROCODONE-ACETAMINOPHEN 7.5-325 MG PO TABS: 1.0000 | ORAL_TABLET | Freq: Three times a day (TID) | ORAL | 0 refills | Status: DC | PRN

## 2017-10-08 MED ORDER — ONDANSETRON HCL 4 MG PO TABS: 4.0000 mg | ORAL_TABLET | Freq: Every day | ORAL | 1 refills | Status: DC | PRN

## 2017-10-08 MED ORDER — MORPHINE SULFATE ER 15 MG PO TBCR: 30.0000 mg | EXTENDED_RELEASE_TABLET | Freq: Two times a day (BID) | ORAL | 0 refills | Status: DC

## 2017-10-08 MED ORDER — ONDANSETRON HCL 4 MG PO TABS: 4.0000 mg | ORAL_TABLET | Freq: Three times a day (TID) | ORAL | 2 refills | Status: DC | PRN

## 2017-10-08 NOTE — Assessment & Plan Note (Addendum)
Patient is on long-term use of 2 tablets of MS Contin 15 mg twice daily and 1 tablet of Norco 2-3 times daily.  Unable to check [employer] because of some signing issues.  All her previous checks documented were appropriate.  It is currently having diarrhea for the past few months, and does not required any stool softeners or MiraLAX.  We had a long discussion regarding safety of opioid use and try to wean that off.  Discussed that patient will try taking 1 tablet of MS Contin instead of 2 and see if that will help her pain.  -3 prescriptions of MS Contin and Norco each was provided. -She will need reevaluation during next follow-up visit to check whether she can tolerate a low-dose so we can adjust her prescription accordingly.

## 2017-10-08 NOTE — Patient Instructions (Signed)
Thank you for visiting clinic today. As we discussed stop taking your metoprolol because of your low blood pressure, if you experience racing of your heart or palpitations you can take a dose and call our clinic. Continue taking Imodium as told by your gastroenterologist. Please keep yourself well hydrated. Try taking 1 of your morphine tablet instead of 2 and see if that will help with your pain, as we discussed it is important to try weaning you off. I am giving you the same amount of tablets to use to take it before, you can take 2 if you continue to experience back pain. Please follow-up in 2 weeks.

## 2017-10-08 NOTE — Assessment & Plan Note (Addendum)
Patient has an history of sinus tachycardia for which she used to take initially Coreg at 3.125 mg twice daily and then she started taking metoprolol 12.5 mg twice daily.  She is having softer blood pressure, denies any dizziness. Is having softer blood pressure for a long time, remained asymptomatic.  On previous notes her metoprolol was discontinued multiple times, patient was not aware of that and continue to take it.  I will stop metoprolol at this time and will follow-up in 2 weeks for any tachycardia. Told the patient that if you experience any palpitations or racing of heart she can restart her metoprolol and inform our clinic. Keeping metoprolol in her med list until the next follow-up visit. Low-dose beta-blocker can be restarted if needed.

## 2017-10-08 NOTE — Progress Notes (Signed)
   CC: To refill her pain meds.  HPI:  [name] Bell is a 72 y.o. with past medical history as listed below came to the clinic to refill her pain meds.  According to patient she is using 2 of MS Contin 15 mg twice daily and Norco 2-3 times daily.  She ran out of her meds today and experiencing a lot of pain in her back and abdomen around her colostomy bag.  She continued to experience nausea intermittently and was also asking for a refill of Zofran to be taken as needed.  She continued to have 5-6 loose bowel movements and changing her colostomy bag approximately 6 times daily.  She follow-up with gastroenterology and now taking Imodium twice daily which has improved her frequency to 5-6 loose bowel movements, before she used to have 8-10.  She has finished her second round of C. difficile treatment with p.o. Vancomycin.  Her next GI appointment is in October 2019.  According to patient her appetite is okay, denies any dysphagia.  She tries to keep herself well-hydrated.  She denies any dizziness, palpitations, chest pain or exertional dyspnea.  Patient is legally blind.  Past Medical History:  Diagnosis Date  . Acute sinusitis 06/30/2011  . Anxiety   . Basal cell carcinoma    "left cheek"  . Bleeding ulcer   . Blind in both eyes   . Chronic back pain   . Degenerative disk disease    This is interscapular, mild compression frx of T12 superior endplate with Schmorl's node. Seen on CT in 2/08  . Depression   . Duodenal ulcer 11/08   With hemorrhage and obstruction  . History of blood transfusion    "related to bleeding from intestines and vomiting blood"  . Ischemic colitis ([place]) 07/30/2011   2009 by endoscopy   . Macular degeneration, wet ([place])   . Osteomyelitis, jaw acute 11/08   Started while in the hospital abcess showed GNR . SP debridement by Dr. Lilli Few,  Priscella Mann S Bovis sp 4  weeks of Pen V started on 05/19/07  with additional 2 weeks in 07/04/07  . Perforated bowel [facility])    surgery july 2013  . Superior mesenteric artery syndrome ([place]) 11/08   sp dilation by Dr. Watt Climes, Pt may require bowel resetion if sx recur   Review of Systems: Negative except mentioned in HPI  Physical Exam:  Vitals:   10/08/17 1512  BP: (!) 104/36  Pulse: 82  Temp: 98.2 F (36.8 C)  TempSrc: Oral  SpO2: 98%  Weight: 123 lb (55.8 kg)   General: Vital signs reviewed.  Patient is well-developed and well-nourished, in no acute distress and cooperative with exam.  Head: Normocephalic and atraumatic. Cardiovascular: RRR, S1 normal, S2 normal, no murmurs, gallops, or rubs. Pulmonary/Chest: Clear to auscultation bilaterally, no wheezes, rales, or rhonchi. Abdominal: Soft, left-sided colostomy bag with a small amount of bowel movement, tenderness around the colostomy area with abdominal wall hernia, bowel sounds positive.  Extremities: No lower extremity edema bilaterally,  pulses symmetric and intact bilaterally. No cyanosis or clubbing. Neurological: A&O x3, Strength is normal and symmetric bilaterally, cranial nerve II-XII are grossly intact, no focal motor deficit, sensory intact to light touch bilaterally.  Skin: Warm, dry and intact. No rashes or erythema. Psychiatric: Normal mood and affect. speech and behavior is normal. Cognition and memory are normal.  Assessment & Plan:   See Encounters Tab for problem based charting.  Patient discussed with Dr. Rebeca Alert.

## 2017-10-08 NOTE — Assessment & Plan Note (Signed)
Patient continued to have diarrhea although the frequency of her lose bowel movements decreased with the use of Imodium twice daily.  She is not using any stool softener or MiraLAX for more than a month now.  She follow-up with gastroenterology, according to patient she has completed 2 courses for C. Difficile.  Advised the patient to keep herself well-hydrated and continue follow-up with gastroenterology.

## 2017-10-11 NOTE — Progress Notes (Signed)
Internal Medicine Clinic Attending  Case discussed with Dr. Amin at the time of the visit.  We reviewed the resident's history and exam and pertinent patient test results.  I agree with the assessment, diagnosis, and plan of care documented in the resident's note.  Alexander Raines, M.D., Ph.D.  

## 2017-10-26 DIAGNOSIS — H353231 Exudative age-related macular degeneration, bilateral, with active choroidal neovascularization: Secondary | ICD-10-CM | POA: Diagnosis not present

## 2017-10-26 DIAGNOSIS — Z961 Presence of intraocular lens: Secondary | ICD-10-CM | POA: Diagnosis not present

## 2017-10-31 ENCOUNTER — Other Ambulatory Visit: Payer: Self-pay | Admitting: Internal Medicine

## 2017-10-31 DIAGNOSIS — K219 Gastro-esophageal reflux disease without esophagitis: Secondary | ICD-10-CM

## 2017-11-01 ENCOUNTER — Other Ambulatory Visit: Payer: Self-pay | Admitting: Internal Medicine

## 2017-11-10 ENCOUNTER — Encounter (INDEPENDENT_AMBULATORY_CARE_PROVIDER_SITE_OTHER): Payer: Self-pay | Admitting: Ophthalmology

## 2017-12-27 ENCOUNTER — Other Ambulatory Visit: Payer: Self-pay | Admitting: Internal Medicine

## 2017-12-27 DIAGNOSIS — D508 Other iron deficiency anemias: Secondary | ICD-10-CM

## 2018-01-03 ENCOUNTER — Telehealth: Payer: Self-pay | Admitting: *Deleted

## 2018-01-03 ENCOUNTER — Other Ambulatory Visit: Payer: Self-pay

## 2018-01-03 DIAGNOSIS — Z79891 Long term (current) use of opiate analgesic: Secondary | ICD-10-CM

## 2018-01-03 NOTE — Telephone Encounter (Addendum)
HYDROcodone-acetaminophen (NORCO) 7.5-325 MG tablet,    morphine (MS CONTIN) 15 MG 12 hr tablet   [facility]  [id number] [place] [place] - [place] [phone] (Phone) [phone] (Fax)

## 2018-01-03 NOTE — Telephone Encounter (Signed)
Could pt be seen in [facility], stopping narcotics suddenly may cause some discomfort. Maybe enough to get her to an appt, you have nothing open til 1/21 unless you were to add her on. She has been taking this for a while.

## 2018-01-04 ENCOUNTER — Other Ambulatory Visit: Payer: Self-pay

## 2018-01-04 ENCOUNTER — Encounter: Payer: Self-pay | Admitting: Internal Medicine

## 2018-01-04 ENCOUNTER — Ambulatory Visit (INDEPENDENT_AMBULATORY_CARE_PROVIDER_SITE_OTHER): Payer: Medicare Other | Admitting: Internal Medicine

## 2018-01-04 DIAGNOSIS — Z79891 Long term (current) use of opiate analgesic: Secondary | ICD-10-CM | POA: Diagnosis not present

## 2018-01-04 DIAGNOSIS — M549 Dorsalgia, unspecified: Secondary | ICD-10-CM | POA: Diagnosis not present

## 2018-01-04 DIAGNOSIS — G8929 Other chronic pain: Secondary | ICD-10-CM | POA: Diagnosis not present

## 2018-01-04 MED ORDER — MORPHINE SULFATE ER 15 MG PO TBCR: 30.0000 mg | EXTENDED_RELEASE_TABLET | Freq: Two times a day (BID) | ORAL | 0 refills | Status: DC

## 2018-01-04 MED ORDER — HYDROCODONE-ACETAMINOPHEN 7.5-325 MG PO TABS: 1.0000 | ORAL_TABLET | Freq: Three times a day (TID) | ORAL | 0 refills | Status: DC | PRN

## 2018-01-04 NOTE — Telephone Encounter (Signed)
Yes she should be seen at [facility]. Sorry I thought I had an earlier opening

## 2018-01-04 NOTE — Patient Instructions (Addendum)
Ms. Rochette,  It was a pleasure meeting you today. Please follow up with Dr. Truman Hayward in January.

## 2018-01-04 NOTE — Assessment & Plan Note (Signed)
Assessment: Long-term use of opiate medication Patient currently takes MS Contin 15 mg #120 monthly And Norco 7.5-325mg  #81 pills monthly. Narcotic database was reviewed and appropriate. At this time will provide 2 month supply to get her to her next appointment with her PCP on Jan 14.  Plan -MS contin and norco refill for 2 months, good through 03/07/18

## 2018-01-04 NOTE — Progress Notes (Signed)
Called pharm, scripts were rec'd

## 2018-01-04 NOTE — Progress Notes (Signed)
   CC: follow-up on long-term use of opioid medication for chronic back pain  HPI:  [name] is a 72 y.o. female with history noted below that presents to the acute care clinic for refill of opioid medication. Please see problem based charting for the status of patient's chronic medical conditions.  Past Medical History:  Diagnosis Date  . Acute sinusitis 06/30/2011  . Anxiety   . Basal cell carcinoma    "left cheek"  . Bleeding ulcer   . Blind in both eyes   . Chronic back pain   . Degenerative disk disease    This is interscapular, mild compression frx of T12 superior endplate with Schmorl's node. Seen on CT in 2/08  . Depression   . Duodenal ulcer 11/08   With hemorrhage and obstruction  . History of blood transfusion    "related to bleeding from intestines and vomiting blood"  . Ischemic colitis ([name] Bell) 07/30/2011   2009 by endoscopy   . Macular degeneration, wet ([name] Bell)   . Osteomyelitis, jaw acute 11/08   Started while in the hospital abcess showed GNR . SP debridement by Dr. Lilli Few,  Priscella Mann S Bovis sp 4  weeks of Pen V started on 05/19/07  with additional 2 weeks in 07/04/07  . Perforated bowel [name] Bell)    surgery july 2013  . Superior mesenteric artery syndrome ([name] Bell) 11/08   sp dilation by Dr. Watt Climes, Pt may require bowel resetion if sx recur    Review of Systems:  Review of Systems  Respiratory: Negative for shortness of breath.   Cardiovascular: Negative for chest pain.  Musculoskeletal: Positive for back pain. Negative for falls.     Physical Exam:  Vitals:   01/04/18 1515  BP: 101/64  Pulse: 88  Temp: 98.7 F (37.1 C)  TempSrc: Oral  SpO2: 96%  Weight: 129 lb 9.6 oz (58.8 kg)  Height: 5\' 4"  (1.626 m)   Physical Exam  Constitutional: She is well-developed, well-nourished, and in no distress.  Cardiovascular: Normal rate, regular rhythm and normal heart sounds. Exam reveals no gallop and no friction rub.  No murmur heard. Pulmonary/Chest: Effort normal  and breath sounds normal. No respiratory distress. She has no wheezes. She has no rales.     Assessment & Plan:   See encounters tab for problem based medical decision making.   Patient discussed with Dr. Rebeca Alert

## 2018-01-06 NOTE — Progress Notes (Signed)
Internal Medicine Clinic Attending  Case discussed with Dr. Hoffman at the time of the visit.  We reviewed the resident's history and exam and pertinent patient test results.  I agree with the assessment, diagnosis, and plan of care documented in the resident's note.  Alexander Raines, M.D., Ph.D.  

## 2018-01-17 ENCOUNTER — Other Ambulatory Visit: Payer: Self-pay | Admitting: Internal Medicine

## 2018-02-07 ENCOUNTER — Other Ambulatory Visit: Payer: Self-pay | Admitting: Internal Medicine

## 2018-02-08 ENCOUNTER — Ambulatory Visit (INDEPENDENT_AMBULATORY_CARE_PROVIDER_SITE_OTHER): Payer: Medicare Other | Admitting: Internal Medicine

## 2018-02-08 ENCOUNTER — Encounter (INDEPENDENT_AMBULATORY_CARE_PROVIDER_SITE_OTHER): Payer: Self-pay

## 2018-02-08 ENCOUNTER — Encounter: Payer: Self-pay | Admitting: Internal Medicine

## 2018-02-08 ENCOUNTER — Other Ambulatory Visit: Payer: Self-pay

## 2018-02-08 VITALS — BP 118/51 | HR 99 | Temp 98.0°F | Ht 64.0 in | Wt 123.2 lb

## 2018-02-08 DIAGNOSIS — Z79891 Long term (current) use of opiate analgesic: Secondary | ICD-10-CM | POA: Diagnosis not present

## 2018-02-08 DIAGNOSIS — M8000XD Age-related osteoporosis with current pathological fracture, unspecified site, subsequent encounter for fracture with routine healing: Secondary | ICD-10-CM

## 2018-02-08 DIAGNOSIS — G8929 Other chronic pain: Secondary | ICD-10-CM

## 2018-02-08 DIAGNOSIS — D508 Other iron deficiency anemias: Secondary | ICD-10-CM

## 2018-02-08 DIAGNOSIS — K559 Vascular disorder of intestine, unspecified: Secondary | ICD-10-CM

## 2018-02-08 DIAGNOSIS — J441 Chronic obstructive pulmonary disease with (acute) exacerbation: Secondary | ICD-10-CM

## 2018-02-08 DIAGNOSIS — D509 Iron deficiency anemia, unspecified: Secondary | ICD-10-CM | POA: Diagnosis not present

## 2018-02-08 DIAGNOSIS — M4854XD Collapsed vertebra, not elsewhere classified, thoracic region, subsequent encounter for fracture with routine healing: Secondary | ICD-10-CM

## 2018-02-08 DIAGNOSIS — E785 Hyperlipidemia, unspecified: Secondary | ICD-10-CM

## 2018-02-08 DIAGNOSIS — J309 Allergic rhinitis, unspecified: Secondary | ICD-10-CM | POA: Diagnosis not present

## 2018-02-08 DIAGNOSIS — H3530 Unspecified macular degeneration: Secondary | ICD-10-CM

## 2018-02-08 DIAGNOSIS — J449 Chronic obstructive pulmonary disease, unspecified: Secondary | ICD-10-CM

## 2018-02-08 DIAGNOSIS — Z933 Colostomy status: Secondary | ICD-10-CM

## 2018-02-08 MED ORDER — ALENDRONATE SODIUM 70 MG PO TABS: 70.0000 mg | ORAL_TABLET | ORAL | 0 refills | Status: DC

## 2018-02-08 MED ORDER — MORPHINE SULFATE ER 15 MG PO TBCR: 30.0000 mg | EXTENDED_RELEASE_TABLET | Freq: Two times a day (BID) | ORAL | 0 refills | Status: DC | PRN

## 2018-02-08 MED ORDER — HYDROCODONE-ACETAMINOPHEN 7.5-325 MG PO TABS: 1.0000 | ORAL_TABLET | Freq: Three times a day (TID) | ORAL | 0 refills | Status: DC | PRN

## 2018-02-08 MED ORDER — FERROUS GLUCONATE 324 (38 FE) MG PO TABS: 324.0000 mg | ORAL_TABLET | Freq: Every day | ORAL | 3 refills | Status: DC

## 2018-02-08 MED ORDER — METOPROLOL TARTRATE 25 MG PO TABS: 12.5000 mg | ORAL_TABLET | Freq: Two times a day (BID) | ORAL | 3 refills | Status: DC

## 2018-02-08 MED ORDER — VITAMIN D2 50 MCG (2000 UT) PO TABS: 2000.0000 [IU] | ORAL_TABLET | Freq: Every day | ORAL | 3 refills | Status: DC

## 2018-02-08 MED ORDER — PRAVASTATIN SODIUM 40 MG PO TABS: 40.0000 mg | ORAL_TABLET | Freq: Every day | ORAL | 3 refills | Status: DC

## 2018-02-08 MED ORDER — PREDNISONE 20 MG PO TABS: 20.0000 mg | ORAL_TABLET | Freq: Every day | ORAL | 0 refills | Status: DC

## 2018-02-08 NOTE — Assessment & Plan Note (Signed)
Pt requires refills on medications with associated diagnosis above.  Reviewed disease process and find this medication to be necessary, will not change dose or alter current therapy. 

## 2018-02-08 NOTE — Progress Notes (Signed)
CC: Productive cough  HPI: [name] is a 73 y.o. F w/ PMH of ischemic colitis s/p perm. Colostomy, chronic pain 2/2 chronic thoracic compression fractures, iron deficiency anemia and COPD presenting with complaints of 2 week duration productive cough. She noted that many of the residents around her facility appear to be sick especially those who did not get influenza vaccines and noted that she also has been having significant chest congestion with productive cough. She mentions that she has significant vision loss due to macular degeneration but her aid told her that her sputum is green without any blood. She states she had to use her nebulizer once a day for the first week and began to increase to twice daily after 1 week. She denies any fevers, chills, chest pain, palpitations, dyspnea, sore throat, nausea, vomiting or diarrhea. She does endorse some rhinorrhea but states she has chronic allergic rhinitis. She states the frequent coughs have been exacerbating her chronic back pain and states her attempts to decrease her opioid usage has been unsuccessful.   Past Medical History:  Diagnosis Date  . Acute sinusitis 06/30/2011  . Anxiety   . Basal cell carcinoma    "left cheek"  . Bleeding ulcer   . Blind in both eyes   . Chronic back pain   . Degenerative disk disease    This is interscapular, mild compression frx of T12 superior endplate with Schmorl's node. Seen on CT in 2/08  . Depression   . Duodenal ulcer 11/08   With hemorrhage and obstruction  . History of blood transfusion    "related to bleeding from intestines and vomiting blood"  . Ischemic colitis ([place]) 07/30/2011   2009 by endoscopy   . Macular degeneration, wet ([place])   . Osteomyelitis, jaw acute 11/08   Started while in the hospital abcess showed GNR . SP debridement by Dr. Lilli Few,  Priscella Mann S Bovis sp 4  weeks of Pen V started on 05/19/07  with additional 2 weeks in 07/04/07  . Perforated bowel [facility])    surgery july  2013  . Superior mesenteric artery syndrome ([place]) 11/08   sp dilation by Dr. Watt Climes, Pt may require bowel resetion if sx recur   Review of Systems: Review of Systems  Constitutional: Negative for chills, fever and malaise/fatigue.  HENT: Positive for congestion. Negative for ear pain, sinus pain and sore throat.   Respiratory: Positive for cough and sputum production. Negative for hemoptysis and shortness of breath.   Cardiovascular: Negative for chest pain and palpitations.  Gastrointestinal: Negative for diarrhea, nausea and vomiting.  Musculoskeletal: Positive for back pain and joint pain. Negative for neck pain.  Neurological: Negative for tingling, sensory change, weakness and headaches.    Physical Exam: Vitals:   02/08/18 1523  BP: (!) 118/51  Pulse: 99  Temp: 98 F (36.7 C)  TempSrc: Oral  SpO2: 98%  Weight: 123 lb 3.2 oz (55.9 kg)  Height: 5\' 4"  (1.626 m)   Physical Exam  Constitutional: She is oriented to person, place, and time.  Chronically ill-appearing female  HENT:  Mouth/Throat: Oropharynx is clear and moist.  Non-erythematous posterior pharynx.  Eyes: Conjunctivae are normal. No scleral icterus.  Neck: Normal range of motion. Neck supple. No JVD present.  Cardiovascular: Normal rate, regular rhythm, normal heart sounds and intact distal pulses.  No murmur heard. Respiratory: Effort normal and breath sounds normal. No respiratory distress. She has no wheezes. She has no rales.  GI: Soft. Bowel sounds are  normal. She exhibits distension (vertebral hernia surroudning colostomy site). There is abdominal tenderness (diffuse tenderness to palpation). There is no rebound and no guarding.  Colostomy bag in place and functioning without surrounding leakage or erythema  Musculoskeletal: Normal range of motion.        General: Tenderness (vertebral tenderness to palpation) present. No edema.  Lymphadenopathy:    She has no cervical adenopathy.  Neurological: She is  alert and oriented to person, place, and time.  Skin: Skin is warm and dry.  Bilateral lower extremities with skin hyperpigmentation and dry flaky skin w/o any ulcerations or exudates      Assessment & Plan:   Long term (current) use of opiate analgesic [name] Bell is on chronic opioid therapy for chronic pain from thoracic compression fractures and history of ischemic colitis. As part of the treatment plan, the [place] controlled substance database is checked at least twice yearly and the database results was checked 02/08/18 and is appropriate.   The last UDS was collected 02/08/18 and results are pending. Patient needs a yearly UDS.  Pain contract was updated and signed on 02/08/2018. Through the pain contract, [name] Bell describes commitment to avoid excessive opioid use and only relying on pain medications to pursue goal of continuing to ambulate with aid of walker and continue to attend social activity of walking around with her friends.  The patient is on Norco 7.5-325mg  q8hr PRN #81 per 30 days & Ms Contin 15mg x2 q12hr PRN #120 per 30 days. Adjunctive treatment includes heat pads and rest. This regimen allows [name] Bell to function without excessive sedation or side effects. The benefits of continuing opioid therapy outweigh the risks and chronic opioids will be continued. Ongoing education about safe opioid treatment is provided.    - C/w Norco 7.5-325mg  q8hr PRN and MS Contin 15mg  BID PRN - F/u in 3 months  Osteoporosis Pt requires refills on medications with associated diagnosis above.  Reviewed disease process and find this medication to be necessary, will not change dose or alter current therapy.   Iron deficiency anemia Started on ferrous gluconate 324mg  daily on 06/17/2017. After found to have iron deficiency anemia during hospital stay. Had endoscopy and colonoscopy w/o evidence of bleeding.  - CBC today - Will d/c ferrous gluconate if levels normalized  COPD (chronic  obstructive pulmonary disease) (HCC) Presents w/ productive cough with sputum causing increase albuterol nebulizer use to BID daily. States significant sick contact around her home. Denies any DOE or respiratory distress but having productive green sputum. States symptoms appear relieved with nebulizer use. Satting 98 on Room air in clinic today. Respiratory exam benign w/o wheeze or rales. Most likely having mild-to-moderate acute on chronic COPD exacerbation possibly incited by viral infection. Will treat w/ short course steroids.  - Prednisone 20mg  for 5 days - Return precautions in worsening productive cough, fevers, chills, nausea  Hyperlipidemia Pt requires refills on medications with associated diagnosis above.  Reviewed disease process and find this medication to be necessary, will not change dose or alter current therapy.    Patient discussed with Dr. Evette Doffing   -Gilberto Better, PGY1

## 2018-02-08 NOTE — Assessment & Plan Note (Signed)
Started on ferrous gluconate 324mg  daily on 06/17/2017. After found to have iron deficiency anemia during hospital stay. Had endoscopy and colonoscopy w/o evidence of bleeding.  - CBC today - Will d/c ferrous gluconate if levels normalized

## 2018-02-08 NOTE — Assessment & Plan Note (Signed)
[name] Bell is on chronic opioid therapy for chronic pain from thoracic compression fractures and history of ischemic colitis. As part of the treatment plan, the [place] controlled substance database is checked at least twice yearly and the database results was checked 02/08/18 and is appropriate.   The last UDS was collected 02/08/18 and results are pending. Patient needs a yearly UDS.  Pain contract was updated and signed on 02/08/2018. Through the pain contract, [name] Bell describes commitment to avoid excessive opioid use and only relying on pain medications to pursue goal of continuing to ambulate with aid of walker and continue to attend social activity of walking around with her friends.  The patient is on Norco 7.5-325mg  q8hr PRN #81 per 30 days & Ms Contin 15mg x2 q12hr PRN #120 per 30 days. Adjunctive treatment includes heat pads and rest. This regimen allows [name] to function without excessive sedation or side effects. The benefits of continuing opioid therapy outweigh the risks and chronic opioids will be continued. Ongoing education about safe opioid treatment is provided.    - C/w Norco 7.5-325mg  q8hr PRN and MS Contin 15mg  BID PRN - F/u in 3 months

## 2018-02-08 NOTE — Patient Instructions (Signed)
[name] Bell  Thank you for coming in to the clinic. Here are our recommendations for you:  - Please take prednisone 20mg  for 5 day period for your cough - Please let us know if your symptoms worsen  It was a pleasure seeing you in the clinic.   Chronic Obstructive Pulmonary Disease Exacerbation Chronic obstructive pulmonary disease (COPD) is a long-term (chronic) lung problem. In COPD, the flow of air from the lungs is limited. COPD exacerbations are times that breathing gets worse and you need more than your normal treatment. Without treatment, they can be life threatening. If they happen often, your lungs can become more damaged. If your COPD gets worse, your doctor may treat you with:  Medicines.  Oxygen.  Different ways to clear your airway, such as using a mask. Follow these instructions at home: Medicines  Take over-the-counter and prescription medicines only as told by your doctor.  If you take an antibiotic or steroid medicine, do not stop taking the medicine even if you start to feel better.  Keep up with shots (vaccinations) as told by your doctor. Be sure to get a yearly (annual) flu shot. Lifestyle  Do not smoke. If you need help quitting, ask your doctor.  Eat healthy foods.  Exercise regularly.  Get plenty of sleep.  Avoid tobacco smoke and other things that can bother your lungs.  Wash your hands often with soap and water. This will help keep you from getting an infection. If you cannot use soap and water, use hand sanitizer.  During flu season, avoid areas that are crowded with people. General instructions  Drink enough fluid to keep your pee (urine) clear or pale yellow. Do not do this if your doctor has told you not to.  Use a cool mist machine (vaporizer).  If you use oxygen or a machine that turns medicine into a mist (nebulizer), continue to use it as told.  Follow all instructions for rehabilitation. These are steps you can take to make your body  work better.  Keep all follow-up visits as told by your doctor. This is important. Contact a doctor if:  Your COPD symptoms get worse than normal. Get help right away if:  You are short of breath and it gets worse.  You have trouble talking.  You have chest pain.  You cough up blood.  You have a fever.  You keep throwing up (vomiting).  You feel weak or you pass out (faint).  You feel confused.  You are not able to sleep because of your symptoms.  You are not able to do daily activities. Summary  COPD exacerbations are times that breathing gets worse and you need more treatment than normal.  COPD exacerbations can be very serious and may cause your lungs to become more damaged.  Do not smoke. If you need help quitting, ask your doctor.  Stay up-to-date on your shots. Get a flu shot every year. This information is not intended to replace advice given to you by your health care provider. Make sure you discuss any questions you have with your health care provider. Document Released: 01/01/2011 Document Revised: 02/17/2016 Document Reviewed: 02/17/2016 Elsevier Interactive Patient Education  2019 [employer].

## 2018-02-08 NOTE — Assessment & Plan Note (Signed)
Presents w/ productive cough with sputum causing increase albuterol nebulizer use to BID daily. States significant sick contact around her home. Denies any DOE or respiratory distress but having productive green sputum. States symptoms appear relieved with nebulizer use. Satting 98 on Room air in clinic today. Respiratory exam benign w/o wheeze or rales. Most likely having mild-to-moderate acute on chronic COPD exacerbation possibly incited by viral infection. Will treat w/ short course steroids.  - Prednisone 20mg  for 5 days - Return precautions in worsening productive cough, fevers, chills, nausea

## 2018-02-09 LAB — CBC
HEMATOCRIT: 42.5 % (ref 34.0–46.6)
Hemoglobin: 14.3 g/dL (ref 11.1–15.9)
MCH: 30.8 pg (ref 26.6–33.0)
MCHC: 33.6 g/dL (ref 31.5–35.7)
MCV: 92 fL (ref 79–97)
Platelets: 342 10*3/uL (ref 150–450)
RBC: 4.64 x10E6/uL (ref 3.77–5.28)
RDW: 12.9 % (ref 11.7–15.4)
WBC: 8.3 10*3/uL (ref 3.4–10.8)

## 2018-02-09 NOTE — Progress Notes (Signed)
Internal Medicine Clinic Attending ° °Case discussed with Dr. Lee at the time of the visit.  We reviewed the resident’s history and exam and pertinent patient test results.  I agree with the assessment, diagnosis, and plan of care documented in the resident’s note.  °

## 2018-02-10 ENCOUNTER — Other Ambulatory Visit: Payer: Self-pay | Admitting: Internal Medicine

## 2018-02-14 LAB — TOXASSURE SELECT,+ANTIDEPR,UR

## 2018-03-24 ENCOUNTER — Inpatient Hospital Stay (HOSPITAL_COMMUNITY): Payer: Medicare Other

## 2018-03-24 ENCOUNTER — Inpatient Hospital Stay (HOSPITAL_COMMUNITY): Payer: Medicare Other | Admitting: Anesthesiology

## 2018-03-24 ENCOUNTER — Other Ambulatory Visit: Payer: Self-pay

## 2018-03-24 ENCOUNTER — Emergency Department (HOSPITAL_COMMUNITY): Payer: Medicare Other

## 2018-03-24 ENCOUNTER — Inpatient Hospital Stay (HOSPITAL_COMMUNITY)
Admission: EM | Admit: 2018-03-24 | Discharge: 2018-03-28 | DRG: 481 | Disposition: A | Discharge status: Skilled Nursing Facility | Payer: Medicare Other | Source: Skilled Nursing Facility | Attending: Student in an Organized Health Care Education/Training Program | Admitting: Student in an Organized Health Care Education/Training Program

## 2018-03-24 ENCOUNTER — Encounter (HOSPITAL_COMMUNITY): Payer: Self-pay | Admitting: Emergency Medicine

## 2018-03-24 ENCOUNTER — Encounter (HOSPITAL_COMMUNITY)
Admission: EM | Disposition: A | Discharge status: Skilled Nursing Facility | Payer: Self-pay | Source: Home / Self Care | Attending: Internal Medicine

## 2018-03-24 DIAGNOSIS — G8929 Other chronic pain: Secondary | ICD-10-CM | POA: Diagnosis present

## 2018-03-24 DIAGNOSIS — W01198A Fall on same level from slipping, tripping and stumbling with subsequent striking against other object, initial encounter: Secondary | ICD-10-CM

## 2018-03-24 DIAGNOSIS — M25532 Pain in left wrist: Secondary | ICD-10-CM | POA: Diagnosis not present

## 2018-03-24 DIAGNOSIS — M4854XA Collapsed vertebra, not elsewhere classified, thoracic region, initial encounter for fracture: Secondary | ICD-10-CM | POA: Diagnosis not present

## 2018-03-24 DIAGNOSIS — S72402A Unspecified fracture of lower end of left femur, initial encounter for closed fracture: Principal | ICD-10-CM | POA: Diagnosis present

## 2018-03-24 DIAGNOSIS — Z7952 Long term (current) use of systemic steroids: Secondary | ICD-10-CM

## 2018-03-24 DIAGNOSIS — S6992XA Unspecified injury of left wrist, hand and finger(s), initial encounter: Secondary | ICD-10-CM | POA: Diagnosis not present

## 2018-03-24 DIAGNOSIS — S79929A Unspecified injury of unspecified thigh, initial encounter: Secondary | ICD-10-CM | POA: Diagnosis not present

## 2018-03-24 DIAGNOSIS — J441 Chronic obstructive pulmonary disease with (acute) exacerbation: Secondary | ICD-10-CM | POA: Diagnosis not present

## 2018-03-24 DIAGNOSIS — Z433 Encounter for attention to colostomy: Secondary | ICD-10-CM | POA: Diagnosis not present

## 2018-03-24 DIAGNOSIS — Z9841 Cataract extraction status, right eye: Secondary | ICD-10-CM | POA: Diagnosis not present

## 2018-03-24 DIAGNOSIS — Z886 Allergy status to analgesic agent status: Secondary | ICD-10-CM | POA: Diagnosis not present

## 2018-03-24 DIAGNOSIS — Z91040 Latex allergy status: Secondary | ICD-10-CM | POA: Diagnosis not present

## 2018-03-24 DIAGNOSIS — M4854XD Collapsed vertebra, not elsewhere classified, thoracic region, subsequent encounter for fracture with routine healing: Secondary | ICD-10-CM

## 2018-03-24 DIAGNOSIS — S7292XA Unspecified fracture of left femur, initial encounter for closed fracture: Secondary | ICD-10-CM

## 2018-03-24 DIAGNOSIS — W19XXXA Unspecified fall, initial encounter: Secondary | ICD-10-CM | POA: Diagnosis not present

## 2018-03-24 DIAGNOSIS — W010XXA Fall on same level from slipping, tripping and stumbling without subsequent striking against object, initial encounter: Secondary | ICD-10-CM | POA: Diagnosis present

## 2018-03-24 DIAGNOSIS — S72465D Nondisplaced supracondylar fracture with intracondylar extension of lower end of left femur, subsequent encounter for closed fracture with routine healing: Secondary | ICD-10-CM | POA: Diagnosis not present

## 2018-03-24 DIAGNOSIS — S72492A Other fracture of lower end of left femur, initial encounter for closed fracture: Secondary | ICD-10-CM | POA: Diagnosis not present

## 2018-03-24 DIAGNOSIS — Z79899 Other long term (current) drug therapy: Secondary | ICD-10-CM | POA: Diagnosis not present

## 2018-03-24 DIAGNOSIS — S299XXA Unspecified injury of thorax, initial encounter: Secondary | ICD-10-CM | POA: Diagnosis not present

## 2018-03-24 DIAGNOSIS — Z7983 Long term (current) use of bisphosphonates: Secondary | ICD-10-CM

## 2018-03-24 DIAGNOSIS — R52 Pain, unspecified: Secondary | ICD-10-CM

## 2018-03-24 DIAGNOSIS — Z8711 Personal history of peptic ulcer disease: Secondary | ICD-10-CM

## 2018-03-24 DIAGNOSIS — Z6821 Body mass index (BMI) 21.0-21.9, adult: Secondary | ICD-10-CM | POA: Diagnosis not present

## 2018-03-24 DIAGNOSIS — Z85828 Personal history of other malignant neoplasm of skin: Secondary | ICD-10-CM

## 2018-03-24 DIAGNOSIS — K6289 Other specified diseases of anus and rectum: Secondary | ICD-10-CM | POA: Diagnosis not present

## 2018-03-24 DIAGNOSIS — R079 Chest pain, unspecified: Secondary | ICD-10-CM | POA: Diagnosis not present

## 2018-03-24 DIAGNOSIS — S0081XA Abrasion of other part of head, initial encounter: Secondary | ICD-10-CM | POA: Diagnosis not present

## 2018-03-24 DIAGNOSIS — R609 Edema, unspecified: Secondary | ICD-10-CM | POA: Diagnosis not present

## 2018-03-24 DIAGNOSIS — R1312 Dysphagia, oropharyngeal phase: Secondary | ICD-10-CM | POA: Diagnosis not present

## 2018-03-24 DIAGNOSIS — Z833 Family history of diabetes mellitus: Secondary | ICD-10-CM | POA: Diagnosis not present

## 2018-03-24 DIAGNOSIS — M8448XD Pathological fracture, other site, subsequent encounter for fracture with routine healing: Secondary | ICD-10-CM | POA: Diagnosis not present

## 2018-03-24 DIAGNOSIS — Z9842 Cataract extraction status, left eye: Secondary | ICD-10-CM | POA: Diagnosis not present

## 2018-03-24 DIAGNOSIS — N898 Other specified noninflammatory disorders of vagina: Secondary | ICD-10-CM | POA: Diagnosis not present

## 2018-03-24 DIAGNOSIS — E46 Unspecified protein-calorie malnutrition: Secondary | ICD-10-CM | POA: Diagnosis not present

## 2018-03-24 DIAGNOSIS — Z8781 Personal history of (healed) traumatic fracture: Secondary | ICD-10-CM

## 2018-03-24 DIAGNOSIS — S72455A Nondisplaced supracondylar fracture without intracondylar extension of lower end of left femur, initial encounter for closed fracture: Secondary | ICD-10-CM | POA: Diagnosis not present

## 2018-03-24 DIAGNOSIS — M810 Age-related osteoporosis without current pathological fracture: Secondary | ICD-10-CM | POA: Diagnosis not present

## 2018-03-24 DIAGNOSIS — K559 Vascular disorder of intestine, unspecified: Secondary | ICD-10-CM | POA: Diagnosis not present

## 2018-03-24 DIAGNOSIS — Z79891 Long term (current) use of opiate analgesic: Secondary | ICD-10-CM | POA: Diagnosis not present

## 2018-03-24 DIAGNOSIS — S72409A Unspecified fracture of lower end of unspecified femur, initial encounter for closed fracture: Secondary | ICD-10-CM

## 2018-03-24 DIAGNOSIS — R262 Difficulty in walking, not elsewhere classified: Secondary | ICD-10-CM | POA: Diagnosis not present

## 2018-03-24 DIAGNOSIS — S0083XA Contusion of other part of head, initial encounter: Secondary | ICD-10-CM | POA: Diagnosis not present

## 2018-03-24 DIAGNOSIS — M80052A Age-related osteoporosis with current pathological fracture, left femur, initial encounter for fracture: Secondary | ICD-10-CM | POA: Diagnosis not present

## 2018-03-24 DIAGNOSIS — H548 Legal blindness, as defined in USA: Secondary | ICD-10-CM | POA: Diagnosis not present

## 2018-03-24 DIAGNOSIS — Z90710 Acquired absence of both cervix and uterus: Secondary | ICD-10-CM

## 2018-03-24 DIAGNOSIS — Z7951 Long term (current) use of inhaled steroids: Secondary | ICD-10-CM | POA: Diagnosis not present

## 2018-03-24 DIAGNOSIS — Z9181 History of falling: Secondary | ICD-10-CM | POA: Diagnosis not present

## 2018-03-24 DIAGNOSIS — R000 Tachycardia, unspecified: Secondary | ICD-10-CM | POA: Diagnosis not present

## 2018-03-24 DIAGNOSIS — M5136 Other intervertebral disc degeneration, lumbar region: Secondary | ICD-10-CM | POA: Diagnosis not present

## 2018-03-24 DIAGNOSIS — M6281 Muscle weakness (generalized): Secondary | ICD-10-CM | POA: Diagnosis not present

## 2018-03-24 DIAGNOSIS — R0602 Shortness of breath: Secondary | ICD-10-CM

## 2018-03-24 DIAGNOSIS — Y92009 Unspecified place in unspecified non-institutional (private) residence as the place of occurrence of the external cause: Secondary | ICD-10-CM

## 2018-03-24 DIAGNOSIS — D509 Iron deficiency anemia, unspecified: Secondary | ICD-10-CM | POA: Diagnosis present

## 2018-03-24 DIAGNOSIS — J449 Chronic obstructive pulmonary disease, unspecified: Secondary | ICD-10-CM

## 2018-03-24 DIAGNOSIS — S199XXA Unspecified injury of neck, initial encounter: Secondary | ICD-10-CM | POA: Diagnosis not present

## 2018-03-24 DIAGNOSIS — S80919A Unspecified superficial injury of unspecified knee, initial encounter: Secondary | ICD-10-CM | POA: Diagnosis not present

## 2018-03-24 DIAGNOSIS — Z961 Presence of intraocular lens: Secondary | ICD-10-CM | POA: Diagnosis present

## 2018-03-24 DIAGNOSIS — Z8249 Family history of ischemic heart disease and other diseases of the circulatory system: Secondary | ICD-10-CM | POA: Diagnosis not present

## 2018-03-24 DIAGNOSIS — S72492D Other fracture of lower end of left femur, subsequent encounter for closed fracture with routine healing: Secondary | ICD-10-CM | POA: Diagnosis not present

## 2018-03-24 DIAGNOSIS — G894 Chronic pain syndrome: Secondary | ICD-10-CM

## 2018-03-24 DIAGNOSIS — Z9889 Other specified postprocedural states: Secondary | ICD-10-CM

## 2018-03-24 DIAGNOSIS — I959 Hypotension, unspecified: Secondary | ICD-10-CM | POA: Diagnosis not present

## 2018-03-24 DIAGNOSIS — E785 Hyperlipidemia, unspecified: Secondary | ICD-10-CM | POA: Diagnosis not present

## 2018-03-24 DIAGNOSIS — E440 Moderate protein-calorie malnutrition: Secondary | ICD-10-CM | POA: Diagnosis not present

## 2018-03-24 DIAGNOSIS — K8020 Calculus of gallbladder without cholecystitis without obstruction: Secondary | ICD-10-CM | POA: Diagnosis not present

## 2018-03-24 DIAGNOSIS — H353290 Exudative age-related macular degeneration, unspecified eye, stage unspecified: Secondary | ICD-10-CM | POA: Diagnosis not present

## 2018-03-24 DIAGNOSIS — Z933 Colostomy status: Secondary | ICD-10-CM | POA: Diagnosis not present

## 2018-03-24 DIAGNOSIS — S0990XA Unspecified injury of head, initial encounter: Secondary | ICD-10-CM | POA: Diagnosis not present

## 2018-03-24 DIAGNOSIS — Z87891 Personal history of nicotine dependence: Secondary | ICD-10-CM | POA: Diagnosis not present

## 2018-03-24 HISTORY — PX: PERCUTANEOUS PINNING: SHX2209

## 2018-03-24 LAB — WET PREP, GENITAL
CLUE CELLS WET PREP: NONE SEEN
Sperm: NONE SEEN
Trich, Wet Prep: NONE SEEN
Yeast Wet Prep HPF POC: NONE SEEN

## 2018-03-24 LAB — MRSA PCR SCREENING: MRSA by PCR: POSITIVE — AB

## 2018-03-24 LAB — COMPREHENSIVE METABOLIC PANEL
ALT: 15 U/L (ref 0–44)
AST: 33 U/L (ref 15–41)
Albumin: 1.6 g/dL — ABNORMAL LOW (ref 3.5–5.0)
Alkaline Phosphatase: 159 U/L — ABNORMAL HIGH (ref 38–126)
Anion gap: 10 (ref 5–15)
BILIRUBIN TOTAL: 0.6 mg/dL (ref 0.3–1.2)
BUN: 6 mg/dL — ABNORMAL LOW (ref 8–23)
CO2: 25 mmol/L (ref 22–32)
Calcium: 8.1 mg/dL — ABNORMAL LOW (ref 8.9–10.3)
Chloride: 100 mmol/L (ref 98–111)
Creatinine, Ser: 0.94 mg/dL (ref 0.44–1.00)
GFR calc Af Amer: >60 mL/min (ref >60)
GFR calc non Af Amer: 60 mL/min — ABNORMAL LOW (ref >60)
Glucose, Bld: 105 mg/dL — ABNORMAL HIGH (ref 70–99)
POTASSIUM: 3.6 mmol/L (ref 3.5–5.1)
Sodium: 135 mmol/L (ref 135–145)
TOTAL PROTEIN: 4.3 g/dL — AB (ref 6.5–8.1)

## 2018-03-24 LAB — CBC WITH DIFFERENTIAL/PLATELET
ABS IMMATURE GRANULOCYTES: 0.06 10*3/uL (ref 0.00–0.07)
BASOS ABS: 0 10*3/uL (ref 0.0–0.1)
Basophils Relative: 0 %
Eosinophils Absolute: 0 10*3/uL (ref 0.0–0.5)
Eosinophils Relative: 0 %
HCT: 41.8 % (ref 36.0–46.0)
Hemoglobin: 13.2 g/dL (ref 12.0–15.0)
Immature Granulocytes: 1 %
Lymphocytes Relative: 12 %
Lymphs Abs: 1.3 10*3/uL (ref 0.7–4.0)
MCH: 29.8 pg (ref 26.0–34.0)
MCHC: 31.6 g/dL (ref 30.0–36.0)
MCV: 94.4 fL (ref 80.0–100.0)
Monocytes Absolute: 0.8 10*3/uL (ref 0.1–1.0)
Monocytes Relative: 8 %
NEUTROS ABS: 8.3 10*3/uL — AB (ref 1.7–7.7)
NEUTROS PCT: 79 %
NRBC: 0 % (ref 0.0–0.2)
Platelets: 211 10*3/uL (ref 150–400)
RBC: 4.43 MIL/uL (ref 3.87–5.11)
RDW: 14.6 % (ref 11.5–15.5)
WBC: 10.5 10*3/uL (ref 4.0–10.5)

## 2018-03-24 LAB — URINALYSIS, ROUTINE W REFLEX MICROSCOPIC
Bacteria, UA: NONE SEEN
Bilirubin Urine: NEGATIVE
Glucose, UA: NEGATIVE mg/dL
Hgb urine dipstick: NEGATIVE
KETONES UR: NEGATIVE mg/dL
Nitrite: NEGATIVE
Protein, ur: NEGATIVE mg/dL
Specific Gravity, Urine: 1.016 (ref 1.005–1.030)
pH: 5 (ref 5.0–8.0)

## 2018-03-24 SURGERY — PINNING, EXTREMITY, PERCUTANEOUS
Anesthesia: General | Site: Leg Upper | Laterality: Left

## 2018-03-24 MED ORDER — ACETAMINOPHEN 325 MG PO TABS: 650.0000 mg | ORAL_TABLET | Freq: Four times a day (QID) | ORAL | Status: DC | PRN

## 2018-03-24 MED ORDER — ACETAMINOPHEN 650 MG RE SUPP: 650.0000 mg | Freq: Four times a day (QID) | RECTAL | Status: DC | PRN

## 2018-03-24 MED ORDER — ONDANSETRON HCL 4 MG/2ML IJ SOLN
INTRAMUSCULAR | Status: AC
  Filled 2018-03-24: qty 2

## 2018-03-24 MED ORDER — HYDROMORPHONE HCL 1 MG/ML IJ SOLN
INTRAMUSCULAR | Status: AC
  Administered 2018-03-25: 0.5 mg via INTRAVENOUS
  Filled 2018-03-24: qty 1

## 2018-03-24 MED ORDER — PROPOFOL 10 MG/ML IV BOLUS
INTRAVENOUS | Status: DC | PRN
  Administered 2018-03-24: 100 mg via INTRAVENOUS

## 2018-03-24 MED ORDER — HYDROCODONE-ACETAMINOPHEN 5-325 MG PO TABS: 1.0000 | ORAL_TABLET | Freq: Four times a day (QID) | ORAL | 0 refills | Status: DC | PRN

## 2018-03-24 MED ORDER — HYDROMORPHONE HCL 1 MG/ML IJ SOLN
0.5000 mg | INTRAMUSCULAR | Status: DC | PRN
  Administered 2018-03-25: 0.5 mg via INTRAVENOUS
  Administered 2018-03-25 – 2018-03-28 (×7): 1 mg via INTRAVENOUS
  Filled 2018-03-24 (×8): qty 1

## 2018-03-24 MED ORDER — FENTANYL CITRATE (PF) 250 MCG/5ML IJ SOLN
INTRAMUSCULAR | Status: DC | PRN
  Administered 2018-03-24: 150 ug via INTRAVENOUS
  Administered 2018-03-24: 100 ug via INTRAVENOUS

## 2018-03-24 MED ORDER — HYDROMORPHONE HCL 1 MG/ML IJ SOLN
1.0000 mg | INTRAMUSCULAR | Status: DC | PRN
  Administered 2018-03-24: 1 mg via INTRAVENOUS
  Filled 2018-03-24: qty 1

## 2018-03-24 MED ORDER — METOCLOPRAMIDE HCL 5 MG/ML IJ SOLN
5.0000 mg | Freq: Three times a day (TID) | INTRAMUSCULAR | Status: DC | PRN
  Administered 2018-03-25: 10 mg via INTRAVENOUS
  Filled 2018-03-24: qty 2

## 2018-03-24 MED ORDER — ACETAMINOPHEN 325 MG PO TABS
325.0000 mg | ORAL_TABLET | Freq: Four times a day (QID) | ORAL | Status: DC | PRN
  Administered 2018-03-27: 650 mg via ORAL
  Administered 2018-03-27: 325 mg via ORAL
  Filled 2018-03-24 (×2): qty 2

## 2018-03-24 MED ORDER — DOCUSATE SODIUM 100 MG PO CAPS: 100.0000 mg | ORAL_CAPSULE | Freq: Two times a day (BID) | ORAL | 0 refills | Status: DC

## 2018-03-24 MED ORDER — METOCLOPRAMIDE HCL 5 MG PO TABS: 5.0000 mg | ORAL_TABLET | Freq: Three times a day (TID) | ORAL | Status: DC | PRN

## 2018-03-24 MED ORDER — METHOCARBAMOL 500 MG PO TABS
500.0000 mg | ORAL_TABLET | Freq: Four times a day (QID) | ORAL | Status: DC | PRN
  Administered 2018-03-25 – 2018-03-28 (×4): 500 mg via ORAL
  Filled 2018-03-24 (×5): qty 1

## 2018-03-24 MED ORDER — POVIDONE-IODINE 10 % EX SWAB
2.0000 "application " | Freq: Once | CUTANEOUS | Status: AC
  Administered 2018-03-24: 2 via TOPICAL

## 2018-03-24 MED ORDER — DIPHENHYDRAMINE HCL 12.5 MG/5ML PO ELIX: 12.5000 mg | ORAL_SOLUTION | ORAL | Status: DC | PRN

## 2018-03-24 MED ORDER — CHLORHEXIDINE GLUCONATE CLOTH 2 % EX PADS
6.0000 | MEDICATED_PAD | Freq: Every day | CUTANEOUS | Status: DC
  Administered 2018-03-25 – 2018-03-28 (×4): 6 via TOPICAL

## 2018-03-24 MED ORDER — OXYCODONE HCL 5 MG PO TABS
5.0000 mg | ORAL_TABLET | Freq: Once | ORAL | Status: AC | PRN
  Administered 2018-03-24: 5 mg via ORAL

## 2018-03-24 MED ORDER — ONDANSETRON HCL 4 MG/2ML IJ SOLN
INTRAMUSCULAR | Status: DC | PRN
  Administered 2018-03-24: 4 mg via INTRAVENOUS

## 2018-03-24 MED ORDER — ONDANSETRON HCL 4 MG/2ML IJ SOLN: 4.0000 mg | Freq: Four times a day (QID) | INTRAMUSCULAR | Status: DC | PRN

## 2018-03-24 MED ORDER — FENTANYL CITRATE (PF) 250 MCG/5ML IJ SOLN
INTRAMUSCULAR | Status: AC
  Filled 2018-03-24: qty 5

## 2018-03-24 MED ORDER — ROCURONIUM BROMIDE 50 MG/5ML IV SOSY
PREFILLED_SYRINGE | INTRAVENOUS | Status: AC
  Filled 2018-03-24: qty 10

## 2018-03-24 MED ORDER — GABAPENTIN 300 MG PO CAPS
300.0000 mg | ORAL_CAPSULE | Freq: Once | ORAL | Status: AC
  Administered 2018-03-24: 300 mg via ORAL
  Filled 2018-03-24: qty 1

## 2018-03-24 MED ORDER — PROMETHAZINE HCL 25 MG/ML IJ SOLN: 6.2500 mg | INTRAMUSCULAR | Status: DC | PRN

## 2018-03-24 MED ORDER — LACTATED RINGERS IV SOLN: INTRAVENOUS | Status: DC

## 2018-03-24 MED ORDER — CEFAZOLIN SODIUM-DEXTROSE 2-4 GM/100ML-% IV SOLN
2.0000 g | INTRAVENOUS | Status: AC
  Administered 2018-03-24: 2 g via INTRAVENOUS
  Filled 2018-03-24: qty 100

## 2018-03-24 MED ORDER — ENOXAPARIN SODIUM 40 MG/0.4ML ~~LOC~~ SOLN
40.0000 mg | SUBCUTANEOUS | Status: DC
  Administered 2018-03-24 – 2018-03-27 (×4): 40 mg via SUBCUTANEOUS
  Filled 2018-03-24 (×4): qty 0.4

## 2018-03-24 MED ORDER — DEXAMETHASONE SODIUM PHOSPHATE 10 MG/ML IJ SOLN
INTRAMUSCULAR | Status: DC | PRN
  Administered 2018-03-24: 10 mg via INTRAVENOUS

## 2018-03-24 MED ORDER — ONDANSETRON HCL 4 MG/2ML IJ SOLN
4.0000 mg | Freq: Once | INTRAMUSCULAR | Status: AC
  Administered 2018-03-24: 4 mg via INTRAVENOUS
  Filled 2018-03-24: qty 2

## 2018-03-24 MED ORDER — METHOCARBAMOL 1000 MG/10ML IJ SOLN
500.0000 mg | Freq: Four times a day (QID) | INTRAVENOUS | Status: DC | PRN
  Filled 2018-03-24: qty 5

## 2018-03-24 MED ORDER — OXYCODONE HCL 5 MG PO TABS: 5.0000 mg | ORAL_TABLET | ORAL | Status: DC | PRN

## 2018-03-24 MED ORDER — 0.9 % SODIUM CHLORIDE (POUR BTL) OPTIME
TOPICAL | Status: DC | PRN
  Administered 2018-03-24: 1000 mL

## 2018-03-24 MED ORDER — ALBUTEROL SULFATE HFA 108 (90 BASE) MCG/ACT IN AERS
INHALATION_SPRAY | RESPIRATORY_TRACT | Status: DC | PRN
  Administered 2018-03-24: 3 via RESPIRATORY_TRACT

## 2018-03-24 MED ORDER — BISACODYL 10 MG RE SUPP: 10.0000 mg | Freq: Every day | RECTAL | Status: DC | PRN

## 2018-03-24 MED ORDER — PHENYLEPHRINE 40 MCG/ML (10ML) SYRINGE FOR IV PUSH (FOR BLOOD PRESSURE SUPPORT)
PREFILLED_SYRINGE | INTRAVENOUS | Status: AC
  Filled 2018-03-24: qty 20

## 2018-03-24 MED ORDER — HYDROMORPHONE HCL 1 MG/ML IJ SOLN
0.2500 mg | INTRAMUSCULAR | Status: DC | PRN
  Administered 2018-03-24 (×4): 0.5 mg via INTRAVENOUS

## 2018-03-24 MED ORDER — HYDROMORPHONE HCL 1 MG/ML IJ SOLN
INTRAMUSCULAR | Status: AC
  Administered 2018-03-25: 1 mg via INTRAVENOUS
  Filled 2018-03-24: qty 1

## 2018-03-24 MED ORDER — OXYCODONE HCL 5 MG PO TABS
5.0000 mg | ORAL_TABLET | ORAL | Status: DC | PRN
  Administered 2018-03-25 – 2018-03-26 (×5): 10 mg via ORAL
  Filled 2018-03-24 (×5): qty 2

## 2018-03-24 MED ORDER — ALBUTEROL SULFATE (2.5 MG/3ML) 0.083% IN NEBU
2.5000 mg | INHALATION_SOLUTION | Freq: Four times a day (QID) | RESPIRATORY_TRACT | Status: DC | PRN
  Administered 2018-03-25 – 2018-03-27 (×3): 2.5 mg via RESPIRATORY_TRACT
  Filled 2018-03-24 (×3): qty 3

## 2018-03-24 MED ORDER — PROPOFOL 10 MG/ML IV BOLUS
INTRAVENOUS | Status: AC
  Filled 2018-03-24: qty 20

## 2018-03-24 MED ORDER — ACETAMINOPHEN 500 MG PO TABS
1000.0000 mg | ORAL_TABLET | Freq: Three times a day (TID) | ORAL | Status: AC
  Administered 2018-03-24 – 2018-03-27 (×7): 1000 mg via ORAL
  Filled 2018-03-24 (×7): qty 2

## 2018-03-24 MED ORDER — FENTANYL CITRATE (PF) 100 MCG/2ML IJ SOLN: 50.0000 ug | INTRAMUSCULAR | Status: DC | PRN

## 2018-03-24 MED ORDER — ONDANSETRON HCL 4 MG PO TABS
4.0000 mg | ORAL_TABLET | Freq: Four times a day (QID) | ORAL | Status: DC | PRN
  Administered 2018-03-25 – 2018-03-27 (×2): 4 mg via ORAL
  Filled 2018-03-24 (×2): qty 1

## 2018-03-24 MED ORDER — ROCURONIUM BROMIDE 10 MG/ML (PF) SYRINGE
PREFILLED_SYRINGE | INTRAVENOUS | Status: DC | PRN
  Administered 2018-03-24: 20 mg via INTRAVENOUS
  Administered 2018-03-24: 30 mg via INTRAVENOUS

## 2018-03-24 MED ORDER — MUPIROCIN 2 % EX OINT
1.0000 "application " | TOPICAL_OINTMENT | Freq: Two times a day (BID) | CUTANEOUS | Status: DC
  Administered 2018-03-24 – 2018-03-28 (×8): 1 via NASAL
  Filled 2018-03-24 (×2): qty 22

## 2018-03-24 MED ORDER — ACETAMINOPHEN 500 MG PO TABS
1000.0000 mg | ORAL_TABLET | Freq: Once | ORAL | Status: DC
  Filled 2018-03-24: qty 2

## 2018-03-24 MED ORDER — LACTATED RINGERS IV SOLN
INTRAVENOUS | Status: DC
  Administered 2018-03-24: 11:00:00 via INTRAVENOUS

## 2018-03-24 MED ORDER — OXYCODONE HCL 5 MG PO TABS
ORAL_TABLET | ORAL | Status: AC
  Administered 2018-03-25: 10 mg via ORAL
  Filled 2018-03-24: qty 1

## 2018-03-24 MED ORDER — FENTANYL CITRATE (PF) 100 MCG/2ML IJ SOLN
50.0000 ug | INTRAMUSCULAR | Status: DC | PRN
  Administered 2018-03-24: 50 ug via INTRAVENOUS
  Filled 2018-03-24: qty 2

## 2018-03-24 MED ORDER — ENOXAPARIN SODIUM 40 MG/0.4ML ~~LOC~~ SOLN: 40.0000 mg | SUBCUTANEOUS | 0 refills | Status: DC

## 2018-03-24 MED ORDER — LIDOCAINE 2% (20 MG/ML) 5 ML SYRINGE
INTRAMUSCULAR | Status: DC | PRN
  Administered 2018-03-24: 60 mg via INTRAVENOUS

## 2018-03-24 MED ORDER — POLYETHYLENE GLYCOL 3350 17 G PO PACK: 17.0000 g | PACK | Freq: Every day | ORAL | Status: DC | PRN

## 2018-03-24 MED ORDER — CEFAZOLIN SODIUM-DEXTROSE 2-4 GM/100ML-% IV SOLN
2.0000 g | Freq: Four times a day (QID) | INTRAVENOUS | Status: AC
  Administered 2018-03-24 – 2018-03-25 (×3): 2 g via INTRAVENOUS
  Filled 2018-03-24 (×3): qty 100

## 2018-03-24 MED ORDER — TRANEXAMIC ACID-NACL 1000-0.7 MG/100ML-% IV SOLN
1000.0000 mg | INTRAVENOUS | Status: AC
  Administered 2018-03-24: 1000 mg via INTRAVENOUS
  Filled 2018-03-24: qty 100

## 2018-03-24 MED ORDER — GABAPENTIN 300 MG PO CAPS
300.0000 mg | ORAL_CAPSULE | Freq: Two times a day (BID) | ORAL | Status: DC
  Administered 2018-03-24 – 2018-03-28 (×8): 300 mg via ORAL
  Filled 2018-03-24 (×9): qty 1

## 2018-03-24 MED ORDER — SENNOSIDES-DOCUSATE SODIUM 8.6-50 MG PO TABS: 1.0000 | ORAL_TABLET | Freq: Every evening | ORAL | Status: DC | PRN

## 2018-03-24 MED ORDER — SUGAMMADEX SODIUM 200 MG/2ML IV SOLN
INTRAVENOUS | Status: DC | PRN
  Administered 2018-03-24: 113.4 mg via INTRAVENOUS

## 2018-03-24 MED ORDER — PHENYLEPHRINE 40 MCG/ML (10ML) SYRINGE FOR IV PUSH (FOR BLOOD PRESSURE SUPPORT)
PREFILLED_SYRINGE | INTRAVENOUS | Status: DC | PRN
  Administered 2018-03-24 (×2): 80 ug via INTRAVENOUS

## 2018-03-24 MED ORDER — CHLORHEXIDINE GLUCONATE 4 % EX LIQD: 60.0000 mL | Freq: Once | CUTANEOUS | Status: DC

## 2018-03-24 MED ORDER — DOCUSATE SODIUM 100 MG PO CAPS
100.0000 mg | ORAL_CAPSULE | Freq: Two times a day (BID) | ORAL | Status: DC
  Administered 2018-03-24 – 2018-03-28 (×8): 100 mg via ORAL
  Filled 2018-03-24 (×8): qty 1

## 2018-03-24 MED ORDER — OXYCODONE HCL 5 MG/5ML PO SOLN: 5.0000 mg | Freq: Once | ORAL | Status: AC | PRN

## 2018-03-24 MED ORDER — FENTANYL CITRATE (PF) 100 MCG/2ML IJ SOLN
50.0000 ug | Freq: Once | INTRAMUSCULAR | Status: AC
  Administered 2018-03-24: 50 ug via INTRAVENOUS
  Filled 2018-03-24: qty 2

## 2018-03-24 MED ORDER — PHENYLEPHRINE 40 MCG/ML (10ML) SYRINGE FOR IV PUSH (FOR BLOOD PRESSURE SUPPORT)
PREFILLED_SYRINGE | INTRAVENOUS | Status: AC
  Filled 2018-03-24: qty 10

## 2018-03-24 MED ORDER — SODIUM CHLORIDE 0.9 % IV BOLUS
500.0000 mL | Freq: Once | INTRAVENOUS | Status: AC
  Administered 2018-03-24: 500 mL via INTRAVENOUS

## 2018-03-24 SURGICAL SUPPLY — 57 items
BANDAGE ACE 4X5 VEL STRL LF (GAUZE/BANDAGES/DRESSINGS) ×4 IMPLANT
BANDAGE ACE 6X5 VEL STRL LF (GAUZE/BANDAGES/DRESSINGS) ×4 IMPLANT
BIT DRILL CANN 3.5MM (DRILL) ×2 IMPLANT
BLADE CLIPPER SURG (BLADE) IMPLANT
BNDG COHESIVE 6X5 TAN STRL LF (GAUZE/BANDAGES/DRESSINGS) ×4 IMPLANT
BNDG ELASTIC 6X10 VLCR STRL LF (GAUZE/BANDAGES/DRESSINGS) ×4 IMPLANT
BNDG GAUZE ELAST 4 BULKY (GAUZE/BANDAGES/DRESSINGS) ×4 IMPLANT
COVER SURGICAL LIGHT HANDLE (MISCELLANEOUS) ×8 IMPLANT
COVER WAND RF STERILE (DRAPES) ×4 IMPLANT
CUFF TOURNIQUET SINGLE 34IN LL (TOURNIQUET CUFF) IMPLANT
DRAPE C-ARM 42X72 X-RAY (DRAPES) ×4 IMPLANT
DRAPE C-ARMOR (DRAPES) ×4 IMPLANT
DRAPE HALF SHEET 40X57 (DRAPES) ×8 IMPLANT
DRAPE IMP U-DRAPE 54X76 (DRAPES) ×4 IMPLANT
DRAPE ORTHO SPLIT 77X108 STRL (DRAPES) ×4
DRAPE SURG ORHT 6 SPLT 77X108 (DRAPES) ×4 IMPLANT
DRAPE U-SHAPE 47X51 STRL (DRAPES) ×4 IMPLANT
DRILL CANN 3.5MM (DRILL) ×4
DRSG ADAPTIC 3X8 NADH LF (GAUZE/BANDAGES/DRESSINGS) ×4 IMPLANT
DRSG MEPILEX BORDER 4X4 (GAUZE/BANDAGES/DRESSINGS) ×4 IMPLANT
DRSG MEPILEX BORDER 4X8 (GAUZE/BANDAGES/DRESSINGS) ×4 IMPLANT
DRSG PAD ABDOMINAL 8X10 ST (GAUZE/BANDAGES/DRESSINGS) ×4 IMPLANT
ELECT REM PT RETURN 9FT ADLT (ELECTROSURGICAL) ×4
ELECTRODE REM PT RTRN 9FT ADLT (ELECTROSURGICAL) ×2 IMPLANT
GAUZE SPONGE 4X4 12PLY STRL LF (GAUZE/BANDAGES/DRESSINGS) ×4 IMPLANT
GLOVE BIOGEL PI IND STRL 8 (GLOVE) ×4 IMPLANT
GLOVE BIOGEL PI INDICATOR 8 (GLOVE) ×4
GLOVE SURG SS PI 7.5 STRL IVOR (GLOVE) ×8 IMPLANT
GOWN STRL REUS W/ TWL LRG LVL3 (GOWN DISPOSABLE) ×4 IMPLANT
GOWN STRL REUS W/ TWL XL LVL3 (GOWN DISPOSABLE) ×2 IMPLANT
GOWN STRL REUS W/TWL LRG LVL3 (GOWN DISPOSABLE) ×4
GOWN STRL REUS W/TWL XL LVL3 (GOWN DISPOSABLE) ×2
K-WIRE 1.8 (WIRE) ×4
K-WIRE FX200X1.8XTROC TIP (WIRE) ×4
KIT BASIN OR (CUSTOM PROCEDURE TRAY) ×4 IMPLANT
KIT TURNOVER KIT B (KITS) ×4 IMPLANT
KWIRE FX200X1.8XTROC TIP (WIRE) ×4 IMPLANT
NEEDLE 22X1 1/2 (OR ONLY) (NEEDLE) ×4 IMPLANT
NS IRRIG 1000ML POUR BTL (IV SOLUTION) ×4 IMPLANT
PACK GENERAL/GYN (CUSTOM PROCEDURE TRAY) ×4 IMPLANT
PACK UNIVERSAL I (CUSTOM PROCEDURE TRAY) ×4 IMPLANT
PAD ARMBOARD 7.5X6 YLW CONV (MISCELLANEOUS) ×4 IMPLANT
PADDING CAST COTTON 6X4 STRL (CAST SUPPLIES) ×4 IMPLANT
SCREW CANN 5.0X40MM (Screw) ×4 IMPLANT
SCREW CANNULATED 5.0X44MM (Screw) ×4 IMPLANT
STOCKINETTE IMPERVIOUS LG (DRAPES) ×4 IMPLANT
SUT ETHILON 3 0 PS 1 (SUTURE) ×4 IMPLANT
SUT MNCRL AB 4-0 PS2 18 (SUTURE) ×4 IMPLANT
SUT MON AB 2-0 CT1 27 (SUTURE) ×4 IMPLANT
SUT VIC AB 0 CT1 27 (SUTURE) ×2
SUT VIC AB 0 CT1 27XBRD ANBCTR (SUTURE) ×2 IMPLANT
SUT VIC AB 2-0 CT1 27 (SUTURE) ×2
SUT VIC AB 2-0 CT1 TAPERPNT 27 (SUTURE) ×2 IMPLANT
SYR CONTROL 10ML LL (SYRINGE) ×4 IMPLANT
TOWEL OR 17X24 6PK STRL BLUE (TOWEL DISPOSABLE) ×4 IMPLANT
TOWEL OR 17X26 10 PK STRL BLUE (TOWEL DISPOSABLE) ×4 IMPLANT
WASHER FLAG 5.0 STRL (Washer) ×8 IMPLANT
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
## 2018-03-24 NOTE — H&P (Signed)
Date: 03/24/2018               Patient Name:  [name] Bell MRN: [id number]  DOB: 11/09/1945 Age / Sex: 73 y.o., female   PCP: Mosetta Anis, MD         Medical Service: Internal Medicine Teaching Service         Attending Physician: Dr. Aldine Contes, MD    First Contact: Dr. Sherry Ruffing Pager: [phone]  Second Contact: Dr. Shan Levans Pager: [phone]       After Hours (After 5p/  First Contact Pager: [phone]  weekends / holidays): Second Contact Pager: [phone]   Chief Complaint: Fall  History of Present Illness: [name] is a 73 year old legally blind female with ischemic colitis s/p perm. Colostomy, osteoporosis, chronic pain 2/2 chronic thoracic compression fractures, iron deficiency anemia, and COPD presenting after a fall. She states she was using her walker, which she normally only uses outside, when she tripped over the wheels of her walker falling face first and on her left wrist and left knee. She states she got carpet burn on her forehead and wrist. She denies losing consciousness or feeling dizzy, lightheaded or weak before the fall. She was unable to bear weight on her left leg but managed to get to her bed and call EMS. She took two or her morphine tablets and one hydrocodone before EMS arrived. States her pain is a 9/10. Denies any headaches, nausea, vomiting, SOB or changes in her vision. Denies any blood thinner use. She has chronic abdominal pain from ischemic colitis but also states that she has been having a one day history of lower abdominal cramping that is worse than her baseline with some vaginal and rectal discharge. She also describes a burning sensation. States in the past this is how she presented when she had abdominal infections.    ED course: On arrival, she was tachycardic. Her oxygen saturation was 91% so she was started on 0.5 L supplemental oxygen. Hand and wrist xrays showed no acute fractures or  dislocation. Left knee, femur and pelvis xray showed nondisplaced intra-articular fracture of the distal femur. CT head showed no acute intracranial abnormalities and CT cervical spine normal alignment of the cervical spine with no acute displaced fractures. She was given fentanyl and an ortho tech placed a knee immobilizer.  Meds:  No current facility-administered medications on file prior to encounter.          Current Outpatient Medications on File Prior to Encounter  Medication Sig Dispense Refill  . albuterol (PROVENTIL) (2.5 MG/3ML) 0.083% nebulizer solution Take 3 mLs (2.5 mg total) by nebulization every 6 (six) hours as needed for wheezing or shortness of breath. 75 mL 12  . alendronate (FOSAMAX) 70 MG tablet Take 1 tablet (70 mg total) by mouth once a week. Take with a full glass of water on an empty stomach. 52 tablet 0  . Ergocalciferol (VITAMIN D2) 50 MCG (2000 UT) TABS Take 2,000 Units by mouth daily. 90 tablet 3  . esomeprazole (NEXIUM) 40 MG capsule TAKE 1 CAPSULE (40 MG TOTAL) BY MOUTH 2 (TWO) TIMES DAILY BEFORE A MEAL. 180 capsule 1  . ferrous gluconate (FERGON) 324 MG tablet Take 1 tablet (324 mg total) by mouth daily with breakfast. 90 tablet 3  . fluticasone (FLONASE) 50 MCG/ACT nasal spray Place 2 sprays into both nostrils daily. 16 g 5  . HYDROcodone-acetaminophen (NORCO) 7.5-325 MG  tablet Take 1 tablet by mouth every 8 (eight) hours as needed for moderate pain. 81 tablet 0  . morphine (MS CONTIN) 15 MG 12 hr tablet Take 2 tablets (30 mg total) by mouth every 12 (twelve) hours as needed for pain. 120 tablet 0  . ondansetron (ZOFRAN) 4 MG tablet TAKE 1 TABLET BY MOUTH EVERY 8 HOURS AS NEEDED FOR NAUSEA AND VOMITING (Patient taking differently: Take 4 mg by mouth every 8 (eight) hours as needed for nausea or vomiting. ) 30 tablet 2  . pravastatin (PRAVACHOL) 40 MG tablet Take 1 tablet (40 mg total) by mouth daily. 90 tablet 3  . predniSONE (DELTASONE) 20 MG tablet Take 1  tablet (20 mg total) by mouth daily with breakfast. 5 tablet 0  . PROAIR HFA 108 (90 Base) MCG/ACT inhaler TAKE 2 PUFFS BY MOUTH EVERY 6 HOURS AS NEEDED FOR WHEEZE OR SHORTNESS OF BREATH (Patient taking differently: Inhale 2 puffs into the lungs every 6 (six) hours as needed for wheezing or shortness of breath. ) 8.5 Inhaler 3  . witch hazel-glycerin (TUCKS) pad Apply topically as needed for itching. 40 each 12   ActiveMedications  No outpatient medications have been marked as taking for the 03/24/18 encounter [facility] Encounter).       Allergies:      Allergies as of 03/24/2018 - Review Complete 03/24/2018  Allergen Reaction Noted  . Ibuprofen Other (See Comments)   . Nsaids Other (See Comments)   . Latex Itching 07/29/2011   Past Medical History:  Diagnosis Date  . Acute sinusitis 06/30/2011  . Anxiety   . Basal cell carcinoma    "left cheek"  . Bleeding ulcer   . Blind in both eyes   . Chronic back pain   . Degenerative disk disease    This is interscapular, mild compression frx of T12 superior endplate with Schmorl's node. Seen on CT in 2/08  . Depression   . Duodenal ulcer 11/08   With hemorrhage and obstruction  . History of blood transfusion    "related to bleeding from intestines and vomiting blood"  . Ischemic colitis ([place]) 07/30/2011   2009 by endoscopy   . Macular degeneration, wet ([place])   . Osteomyelitis, jaw acute 11/08   Started while in the hospital abcess showed GNR . SP debridement by Dr. Lilli Few,  Priscella Mann S Bovis sp 4  weeks of Pen V started on 05/19/07  with additional 2 weeks in 07/04/07  . Perforated bowel [facility])    surgery july 2013  . Superior mesenteric artery syndrome (HCC) 11/08   sp dilation by Dr. Watt Climes, Pt may require bowel resetion if sx recur    Family History:       Family History  Problem Relation Age of Onset  . Cancer Mother        Lung  . Heart disease Father   . Diabetes Father   . Diabetes Sister      Social History:  Patient is legally blind, lives alone at home and is able to perform her ADL's. She normally only uses a walker outside of her home. She smoked 2 packs a day for about 59 years. Quit 5 years ago. Denies any alcohol or illicit drug use.  Social History        Socioeconomic History  . Marital status: Divorced    Spouse name: Not on file  . Number of children: Not on file  . Years of education: Not on file  . Highest  education level: Not on file  Occupational History  . Not on file  Social Needs  . Financial resource strain: Not on file  . Food insecurity:    Worry: Not on file    Inability: Not on file  . Transportation needs:    Medical: Not on file    Non-medical: Not on file  Tobacco Use  . Smoking status: Former Smoker    Packs/day: 1.50    Years: 59.00    Pack years: 88.50    Types: Cigarettes    Last attempt to quit: 05/30/2014    Years since quitting: 3.8  . Smokeless tobacco: Never Used  Substance and Sexual Activity  . Alcohol use: No    Alcohol/week: 0.0 standard drinks  . Drug use: No  . Sexual activity: Never  Lifestyle  . Physical activity:    Days per week: Not on file    Minutes per session: Not on file  . Stress: Not on file  Relationships  . Social connections:    Talks on phone: Not on file    Gets together: Not on file    Attends religious service: Not on file    Active member of club or organization: Not on file    Attends meetings of clubs or organizations: Not on file    Relationship status: Not on file  . Intimate partner violence:    Fear of current or ex partner: Not on file    Emotionally abused: Not on file    Physically abused: Not on file    Forced sexual activity: Not on file  Other Topics Concern  . Not on file  Social History Narrative   Pt now lives by herself in an ALF [facility]) where she can come and go as she pleases.  A good friend named Truman Hayward still  looks out for her daily.     Review of Systems: A complete ROS was negative except as per HPI.   Physical Exam: Blood pressure (!) 117/57, pulse (!) 113, resp. rate 17, last menstrual period 08/25/1980, SpO2 91 %.  Physical Exam  Constitutional: She is oriented to person, place, and time and well-developed, well-nourished, and in no distress.  Cardiovascular: Regular rhythm and normal heart sounds. Tachycardia present.  No murmur heard. Pulmonary/Chest: Effort normal. No respiratory distress. She has wheezes.  Mild wheezing on bilateral upper lateral lung fields  Abdominal: Soft. Bowel sounds are normal. She exhibits no distension. There is abdominal tenderness.  Musculoskeletal:        General: No edema.     Comments: Left leg in a knee immobilizer   Neurological: She is alert and oriented to person, place, and time.  Patient was able to dorsiflex and plantarflex bilateral feet and ankles, sensation in tact in bilateral LE, UE strength intact   Skin: Skin is warm and dry.  Erythematous skin lesion on forehead and left wrist   Psychiatric: Mood, memory, affect and judgment normal.    EKG: personally reviewed my interpretation is sinus tachycardia  CXR: personally reviewed my interpretation is emphysematous changes and fibrosis in the lungs. No evidence of active pulmonary disease.  Assessment & Plan by Problem: Active Problems:   Femur fracture, left ([place])  Ms. Kerstyn Huge is a 73 year old legally blind female with ischemic colitis s/p perm. Colostomy, chronic pain 2/2 chronic thoracic compression fractures, iron deficiency anemia, and COPD presenting after a fall. Imaging revealed a distal left femur fracture. Dr. Percell Miller plans for a fixation  later today.  Left femur fracture Imaging illustrated a nondisplaced intra-articular fracture of the left distal femur. Ortho placed in her knee immobilizer and is planning a fixation later today. - Follow up with ortho  recommendations - Pain control: fentanyl 50 mcg IV q2h, acetaminophen 650 mg q6h prn  Vaginal burning Patient reports a one day history of vaginal and rectal burning with increased discharge. She is also having lower abdominal cramping. She states in the past this is due to abdominal infections for which she has required IV abx treatment.  - Follow up UA - Follow up wet prep  Chronic pain with long term opiate use  Ischemic colitis s/p perm colostomy bag Patient normally takes norco 7.5-325 mg q8h prn and MS contin 15 mg bid prn  Iron deficiency anemia Patient takes ferrous gluconate 324 mg daily. Most recent Hgb 14.3 and plan was to d/c ferrous gluconate if levels normalized.   COPD Recently took prednisone 20 mg for 5 days due to a productive cough. Uses albuterol nebulizer treatment. She was saturating at 100% on 0.5 L supplemental oxygen. Never required supplemental oxygen at home. She continued to saturate well when the oxygen was turned off. - continue albuterol nebulizer treatment prn  Osteoporosis -home medication includes alendronate 70 mg weekly  Diet: NPO VTE prophylaxis: Lovenox Full code   Dispo: Admit patient to Inpatient with expected length of stay greater than 2 midnights.  SignedMike Craze, DO 03/24/2018, 5:49 AM  Pager: [phone]     This note was originally entered erroneously as a progress note.  I have copied it as an H and P for the original provider.    Vickki Muff MD PGY-2 Internal Medicine Pager # [phone]

## 2018-03-24 NOTE — Progress Notes (Signed)
   Subjective: [name] Bell reports that she is doing okay today, no acute events overnight.  She states that she is having a lot of left leg pain, reports that it is a spasm-like motion.  She denies any new complaints.  She reports that she had a pretty bad fall while she was using her walker.  She only had one bone fracture before this.  We discussed the plan for today and she is in agreement.  Objective:  Vital signs in last 24 hours: Vitals:   03/24/18 0530 03/24/18 0600 03/24/18 0634 03/24/18 1047  BP:   (!) 101/52   Pulse: (!) 110 (!) 110 (!) 107   Resp: 15 18    Temp:   98.4 F (36.9 C)   SpO2: 100% 100% 98%   Weight:    56.7 kg  Height:    5\' 4"  (1.626 m)    General: Elderly female, no acute distress, lying flat Cardiac: RRR, no murmurs rubs or gallops Pulmonary: CTA BL, no wheezing or rhonchi Abdomen: Soft, minimal TTP, no guarding or rebound Extremity: Left leg in knee immobilizer, warm extremities, sensation intact in BL LE, 2+ bilateral pulses    Assessment/Plan:  Principal Problem:   Femur fracture, left (HCC) Active Problems:   Closed fracture of distal end of femur, unspecified fracture morphology, initial encounter [facility])  This is a 73 year old female with a history of legally blind, ischemic colitis status post permanent colostomy, chronic pain secondary to chronic thoracic compression fraction, IDA, and COPD who presented after a mechanical fall at home.  She had imaging that showed a distal left femur fracture.  Orthopedics evaluated and they plan for fixation today.  Left femur fracture: -Left femur x-ray and CT left knee showed nondisplaced intra-articular fracture of the left distal femur. Orthopedics had been consulted and placed her in a knee immobilizer and plan for fixation today.  -Continue pain control with fentanyl 50 mcg IV every 2 hours and Tylenol 650 mg every 6. -Orthopedics following, plan for fixation today -Received 2 g Ancef for surgical  prophylaxis  Vaginal burning: -She reported a 1 day history of vaginal and rectal burning with increased discharge.  As well as some lower abdominal cramping.  She has had abdominal infections in the past.  Today she is still having some abdominal tenderness, she remains afebrile and has no leukocytosis.  -UA showed no signs of infection -Follow-up wet prep -Daily CBC  Chronic pain with long-term opiate use -She takes Norco 7.5-325 mg q8h prn and MS contin 15 mg bid prn at home -Continue fentanyl 50 mcg be every 2 hours -Dilaudid 1 mg every 3 hours as needed for severe pain -OxyIR every 4 for pain control  IDA: -She takes ferrous gluconate 324 mg daily.  Hemoglobin today was 13. -Continue to monitor  COPD: -Continue albuterol as needed  Osteoporosis: -She is already on alendronate 70 mg weekly, will continue this on discharge  FEN: LR at 75cc/hr, replete lytes prn, NPO, restart regular diet after VTE ppx: Lovenox  Code Status: FULL    Dispo: Anticipated discharge is pending clinical improvement.   Asencion Noble, MD 03/24/2018, 11:05 AM Pager: [phone]

## 2018-03-24 NOTE — Transfer of Care (Signed)
Immediate Anesthesia Transfer of Care Note  Patient: [name] Bell  Procedure(s) Performed: Percutaneous Pinning of Distal Femur (Left Leg Upper)  Patient Location: PACU  Anesthesia Type:General  Level of Consciousness: awake, alert  and oriented  Airway & Oxygen Therapy: Patient Spontanous Breathing and Patient connected to face mask oxygen  Post-op Assessment: Report given to RN, Post -op Vital signs reviewed and stable and Patient moving all extremities X 4  Post vital signs: Reviewed and stable  Last Vitals:  Vitals Value Taken Time  BP 105/59 03/24/2018  1:27 PM  Temp    Pulse 117 03/24/2018  1:26 PM  Resp 19 03/24/2018  1:29 PM  SpO2 97 % 03/24/2018  1:26 PM  Vitals shown include unvalidated device data.  Last Pain:  Vitals:   03/24/18 1320  PainSc: 7          Complications: No apparent anesthesia complications

## 2018-03-24 NOTE — Progress Notes (Addendum)
Date: 03/24/2018               Patient Name:  [name] Bell MRN: [id number]  DOB: October 01, 1945 Age / Sex: 73 y.o., female   PCP: Mosetta Anis, MD         Medical Service: Internal Medicine Teaching Service         Attending Physician: Dr. Aldine Contes, MD    First Contact: Dr. Sherry Ruffing Pager: [phone]  Second Contact: Dr. Shan Levans Pager: [phone]       After Hours (After 5p/  First Contact Pager: [phone]  weekends / holidays): Second Contact Pager: [phone]   Chief Complaint: Fall  History of Present Illness: [name] is a 73 year old legally blind female with ischemic colitis s/p perm. Colostomy, osteoporosis, chronic pain 2/2 chronic thoracic compression fractures, iron deficiency anemia, and COPD presenting after a fall. She states she was using her walker, which she normally only uses outside, when she tripped over the wheels of her walker falling face first and on her left wrist and left knee. She states she got carpet burn on her forehead and wrist. She denies losing consciousness or feeling dizzy, lightheaded or weak before the fall. She was unable to bear weight on her left leg but managed to get to her bed and call EMS. She took two or her morphine tablets and one hydrocodone before EMS arrived. States her pain is a 9/10. Denies any headaches, nausea, vomiting, SOB or changes in her vision. Denies any blood thinner use. She has chronic abdominal pain from ischemic colitis but also states that she has been having a one day history of lower abdominal cramping that is worse than her baseline with some vaginal and rectal discharge. She also describes a burning sensation. States in the past this is how she presented when she had abdominal infections.    ED course: On arrival, she was tachycardic. Her oxygen saturation was 91% so she was started on 0.5 L supplemental oxygen. Hand and wrist xrays showed no acute fractures or dislocation. Left knee, femur and pelvis xray showed  nondisplaced intra-articular fracture of the distal femur. CT head showed no acute intracranial abnormalities and CT cervical spine normal alignment of the cervical spine with no acute displaced fractures. She was given fentanyl and an ortho tech placed a knee immobilizer.  Meds:  No current facility-administered medications on file prior to encounter.    Current Outpatient Medications on File Prior to Encounter  Medication Sig Dispense Refill  . albuterol (PROVENTIL) (2.5 MG/3ML) 0.083% nebulizer solution Take 3 mLs (2.5 mg total) by nebulization every 6 (six) hours as needed for wheezing or shortness of breath. 75 mL 12  . alendronate (FOSAMAX) 70 MG tablet Take 1 tablet (70 mg total) by mouth once a week. Take with a full glass of water on an empty stomach. 52 tablet 0  . Ergocalciferol (VITAMIN D2) 50 MCG (2000 UT) TABS Take 2,000 Units by mouth daily. 90 tablet 3  . esomeprazole (NEXIUM) 40 MG capsule TAKE 1 CAPSULE (40 MG TOTAL) BY MOUTH 2 (TWO) TIMES DAILY BEFORE A MEAL. 180 capsule 1  . ferrous gluconate (FERGON) 324 MG tablet Take 1 tablet (324 mg total) by mouth daily with breakfast. 90 tablet 3  . fluticasone (FLONASE) 50 MCG/ACT nasal spray Place 2 sprays into both nostrils daily. 16 g 5  . HYDROcodone-acetaminophen (NORCO) 7.5-325 MG tablet Take 1 tablet by mouth every 8 (eight) hours as needed for  moderate pain. 81 tablet 0  . morphine (MS CONTIN) 15 MG 12 hr tablet Take 2 tablets (30 mg total) by mouth every 12 (twelve) hours as needed for pain. 120 tablet 0  . ondansetron (ZOFRAN) 4 MG tablet TAKE 1 TABLET BY MOUTH EVERY 8 HOURS AS NEEDED FOR NAUSEA AND VOMITING (Patient taking differently: Take 4 mg by mouth every 8 (eight) hours as needed for nausea or vomiting. ) 30 tablet 2  . pravastatin (PRAVACHOL) 40 MG tablet Take 1 tablet (40 mg total) by mouth daily. 90 tablet 3  . predniSONE (DELTASONE) 20 MG tablet Take 1 tablet (20 mg total) by mouth daily with breakfast. 5 tablet 0  .  PROAIR HFA 108 (90 Base) MCG/ACT inhaler TAKE 2 PUFFS BY MOUTH EVERY 6 HOURS AS NEEDED FOR WHEEZE OR SHORTNESS OF BREATH (Patient taking differently: Inhale 2 puffs into the lungs every 6 (six) hours as needed for wheezing or shortness of breath. ) 8.5 Inhaler 3  . witch hazel-glycerin (TUCKS) pad Apply topically as needed for itching. 40 each 12   No outpatient medications have been marked as taking for the 03/24/18 encounter [facility] Encounter).     Allergies: Allergies as of 03/24/2018 - Review Complete 03/24/2018  Allergen Reaction Noted  . Ibuprofen Other (See Comments)   . Nsaids Other (See Comments)   . Latex Itching 07/29/2011   Past Medical History:  Diagnosis Date  . Acute sinusitis 06/30/2011  . Anxiety   . Basal cell carcinoma    "left cheek"  . Bleeding ulcer   . Blind in both eyes   . Chronic back pain   . Degenerative disk disease    This is interscapular, mild compression frx of T12 superior endplate with Schmorl's node. Seen on CT in 2/08  . Depression   . Duodenal ulcer 11/08   With hemorrhage and obstruction  . History of blood transfusion    "related to bleeding from intestines and vomiting blood"  . Ischemic colitis ([place]) 07/30/2011   2009 by endoscopy   . Macular degeneration, wet ([place])   . Osteomyelitis, jaw acute 11/08   Started while in the hospital abcess showed GNR . SP debridement by Dr. Lilli Few,  Priscella Mann S Bovis sp 4  weeks of Pen V started on 05/19/07  with additional 2 weeks in 07/04/07  . Perforated bowel [facility])    surgery july 2013  . Superior mesenteric artery syndrome ([facility]) 11/08   sp dilation by Dr. Watt Climes, Pt may require bowel resetion if sx recur    Family History:  Family History  Problem Relation Age of Onset  . Cancer Mother        Lung  . Heart disease Father   . Diabetes Father   . Diabetes Sister     Social History:  Patient is legally blind, lives alone at home and is able to perform her ADL's. She normally only uses a walker  outside of her home. She smoked 2 packs a day for about 59 years. Quit 5 years ago. Denies any alcohol or illicit drug use.  Social History   Socioeconomic History  . Marital status: Divorced    Spouse name: Not on file  . Number of children: Not on file  . Years of education: Not on file  . Highest education level: Not on file  Occupational History  . Not on file  Social Needs  . Financial resource strain: Not on file  . Food insecurity:    Worry:  Not on file    Inability: Not on file  . Transportation needs:    Medical: Not on file    Non-medical: Not on file  Tobacco Use  . Smoking status: Former Smoker    Packs/day: 1.50    Years: 59.00    Pack years: 88.50    Types: Cigarettes    Last attempt to quit: 05/30/2014    Years since quitting: 3.8  . Smokeless tobacco: Never Used  Substance and Sexual Activity  . Alcohol use: No    Alcohol/week: 0.0 standard drinks  . Drug use: No  . Sexual activity: Never  Lifestyle  . Physical activity:    Days per week: Not on file    Minutes per session: Not on file  . Stress: Not on file  Relationships  . Social connections:    Talks on phone: Not on file    Gets together: Not on file    Attends religious service: Not on file    Active member of club or organization: Not on file    Attends meetings of clubs or organizations: Not on file    Relationship status: Not on file  . Intimate partner violence:    Fear of current or ex partner: Not on file    Emotionally abused: Not on file    Physically abused: Not on file    Forced sexual activity: Not on file  Other Topics Concern  . Not on file  Social History Narrative   Pt now lives by herself in an ALF [facility]) where she can come and go as she pleases.  A good friend named Truman Hayward still looks out for her daily.     Review of Systems: A complete ROS was negative except as per HPI.   Physical Exam: Blood pressure (!) 117/57, pulse (!) 113, resp. rate 17, last menstrual  period 08/25/1980, SpO2 91 %.  Physical Exam  Constitutional: She is oriented to person, place, and time and well-developed, well-nourished, and in no distress.  Cardiovascular: Regular rhythm and normal heart sounds. Tachycardia present.  No murmur heard. Pulmonary/Chest: Effort normal. No respiratory distress. She has wheezes.  Mild wheezing on bilateral upper lateral lung fields  Abdominal: Soft. Bowel sounds are normal. She exhibits no distension. There is abdominal tenderness.  Musculoskeletal:        General: No edema.     Comments: Left leg in a knee immobilizer   Neurological: She is alert and oriented to person, place, and time.  Patient was able to dorsiflex and plantarflex bilateral feet and ankles, sensation in tact in bilateral LE, UE strength intact   Skin: Skin is warm and dry.  Erythematous skin lesion on forehead and left wrist   Psychiatric: Mood, memory, affect and judgment normal.    EKG: personally reviewed my interpretation is sinus tachycardia  CXR: personally reviewed my interpretation is emphysematous changes and fibrosis in the lungs. No evidence of active pulmonary disease.  Assessment & Plan by Problem: Active Problems:   Femur fracture, left ([place])  Ms. Lamica Hilario is a 73 year old legally blind female with ischemic colitis s/p perm. Colostomy, chronic pain 2/2 chronic thoracic compression fractures, iron deficiency anemia, and COPD presenting after a fall. Imaging revealed a distal left femur fracture. Dr. Percell Miller plans for a fixation later today.  Left femur fracture Imaging illustrated a nondisplaced intra-articular fracture of the left distal femur. Ortho placed in her knee immobilizer and is planning a fixation later today. - Follow  up with ortho recommendations - Pain control: fentanyl 50 mcg IV q2h, acetaminophen 650 mg q6h prn  Vaginal burning Patient reports a one day history of vaginal and rectal burning with increased discharge. She is also  having lower abdominal cramping. She states in the past this is due to abdominal infections for which she has required IV abx treatment.  - Follow up UA - Follow up wet prep  Chronic pain with long term opiate use  Ischemic colitis s/p perm colostomy bag Patient normally takes norco 7.5-325 mg q8h prn and MS contin 15 mg bid prn  Iron deficiency anemia Patient takes ferrous gluconate 324 mg daily. Most recent Hgb 14.3 and plan was to d/c ferrous gluconate if levels normalized.   COPD Recently took prednisone 20 mg for 5 days due to a productive cough. Uses albuterol nebulizer treatment. She was saturating at 100% on 0.5 L supplemental oxygen. Never required supplemental oxygen at home. She continued to saturate well when the oxygen was turned off. - continue albuterol nebulizer treatment prn  Osteoporosis -home medication includes alendronate 70 mg weekly  Diet: NPO VTE prophylaxis: Lovenox Full code   Dispo: Admit patient to Inpatient with expected length of stay greater than 2 midnights.  SignedMike Craze, DO 03/24/2018, 5:49 AM  Pager: [phone]

## 2018-03-24 NOTE — ED Notes (Signed)
Patient placed on 3L O2 via n.c. for O2 sat consistently 90-91% on room air. Sat improved to 100% with supplemental O2

## 2018-03-24 NOTE — ED Notes (Signed)
Patient transported to X-ray 

## 2018-03-24 NOTE — ED Triage Notes (Signed)
Pt arrives to ED from home with complaints of mechanical fall since 2 hours ago (0001). EMS reports pt stated she tripped over the wheels of her walker and fell on her face and her left knee. Pt complains of left knee pain, left wrist pain, and a small skin tear scuff on her forehead. Pt states before EMS came she took at home two of her morphine tablets and one hydrocodone tablet. She states she think this helped her pain some because at time of ingestion her pain was "unbearable" Pt placed in position of comfort with bed locked and lowered, call bell in reach.

## 2018-03-24 NOTE — Interval H&P Note (Signed)
I participated in the care of this patient and agree with the above history, physical and evaluation. I performed a review of the history and a physical exam as detailed   Charday Capetillo Daniel Cameo Shewell MD  

## 2018-03-24 NOTE — Anesthesia Postprocedure Evaluation (Signed)
Anesthesia Post Note  Patient: [name] Bell  Procedure(s) Performed: Percutaneous Pinning of Distal Femur (Left Leg Upper)     Patient location during evaluation: PACU Anesthesia Type: General Level of consciousness: awake and alert Pain management: pain level controlled Vital Signs Assessment: post-procedure vital signs reviewed and stable Respiratory status: spontaneous breathing, nonlabored ventilation, respiratory function stable and patient connected to nasal cannula oxygen Cardiovascular status: blood pressure returned to baseline and stable Postop Assessment: no apparent nausea or vomiting Anesthetic complications: no    Last Vitals:  Vitals:   03/24/18 1409 03/24/18 1429  BP: (!) 108/94 95/60  Pulse: (!) 108 (!) 111  Resp: 16 17  Temp:    SpO2: 99% 93%    Last Pain:  Vitals:   03/24/18 1356  PainSc: 6                  Lonia Roane S

## 2018-03-24 NOTE — Anesthesia Preprocedure Evaluation (Signed)
Anesthesia Evaluation  Patient identified by MRN, date of birth, ID band Patient awake    Reviewed: Allergy & Precautions, NPO status , Patient's Chart, lab work & pertinent test results  Airway Mallampati: II  TM Distance: >3 FB Neck ROM: Full    Dental no notable dental hx.    Pulmonary COPD,  COPD inhaler, former smoker,     + decreased breath sounds+ wheezing      Cardiovascular negative cardio ROS Normal cardiovascular exam Rhythm:Regular Rate:Normal     Neuro/Psych negative neurological ROS  negative psych ROS   GI/Hepatic Neg liver ROS, PUD,   Endo/Other  negative endocrine ROS  Renal/GU negative Renal ROS  negative genitourinary   Musculoskeletal negative musculoskeletal ROS (+)   Abdominal   Peds negative pediatric ROS (+)  Hematology  (+) anemia ,   Anesthesia Other Findings   Reproductive/Obstetrics negative OB ROS                             Anesthesia Physical Anesthesia Plan  ASA: III  Anesthesia Plan: General   Post-op Pain Management:    Induction: Intravenous  PONV Risk Score and Plan: 3 and Ondansetron, Dexamethasone and Treatment may vary due to age or medical condition  Airway Management Planned: Oral ETT  Additional Equipment:   Intra-op Plan:   Post-operative Plan: Extubation in OR  Informed Consent: I have reviewed the patients History and Physical, chart, labs and discussed the procedure including the risks, benefits and alternatives for the proposed anesthesia with the patient or authorized representative who has indicated his/her understanding and acceptance.     Dental advisory given  Plan Discussed with: CRNA and Surgeon  Anesthesia Plan Comments:         Anesthesia Quick Evaluation

## 2018-03-24 NOTE — Anesthesia Procedure Notes (Signed)
Procedure Name: Intubation Date/Time: 03/24/2018 11:39 AM Performed by: Imagene Riches, CRNA Pre-anesthesia Checklist: Patient identified, Emergency Drugs available, Suction available and Patient being monitored Patient Re-evaluated:Patient Re-evaluated prior to induction Oxygen Delivery Method: Circle System Utilized Preoxygenation: Pre-oxygenation with 100% oxygen Induction Type: IV induction Ventilation: Mask ventilation without difficulty Grade View: Grade I Tube type: Oral Tube size: 7.0 mm Number of attempts: 1 Airway Equipment and Method: Stylet and Oral airway Placement Confirmation: ETT inserted through vocal cords under direct vision,  positive ETCO2 and breath sounds checked- equal and bilateral Tube secured with: Tape Dental Injury: Teeth and Oropharynx as per pre-operative assessment

## 2018-03-24 NOTE — ED Notes (Signed)
Ortho tech at bedside to place knee immobilizer

## 2018-03-24 NOTE — Progress Notes (Addendum)
1020 Pt is A&O x4, legally blind. Signed consent for surgery. NPO maint. CHG completed. To short stay, no family present. 1425 Received pt from PACU, A & O x4. LLE with ace wrap dry and intact. Left knee immobilizer on. Denies pain at this time.
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
## 2018-03-25 ENCOUNTER — Encounter (HOSPITAL_COMMUNITY): Payer: Self-pay | Admitting: Orthopedic Surgery

## 2018-03-25 DIAGNOSIS — W19XXXA Unspecified fall, initial encounter: Secondary | ICD-10-CM

## 2018-03-25 LAB — BASIC METABOLIC PANEL
ANION GAP: 10 (ref 5–15)
BUN: 5 mg/dL — ABNORMAL LOW (ref 8–23)
CALCIUM: 7.7 mg/dL — AB (ref 8.9–10.3)
CO2: 23 mmol/L (ref 22–32)
Chloride: 102 mmol/L (ref 98–111)
Creatinine, Ser: 0.72 mg/dL (ref 0.44–1.00)
GFR calc non Af Amer: >60 mL/min (ref >60)
Glucose, Bld: 117 mg/dL — ABNORMAL HIGH (ref 70–99)
Potassium: 4.2 mmol/L (ref 3.5–5.1)
Sodium: 135 mmol/L (ref 135–145)

## 2018-03-25 LAB — CBC
HCT: 39.1 % (ref 36.0–46.0)
Hemoglobin: 13.1 g/dL (ref 12.0–15.0)
MCH: 31 pg (ref 26.0–34.0)
MCHC: 33.5 g/dL (ref 30.0–36.0)
MCV: 92.4 fL (ref 80.0–100.0)
Platelets: 210 10*3/uL (ref 150–400)
RBC: 4.23 MIL/uL (ref 3.87–5.11)
RDW: 14.6 % (ref 11.5–15.5)
WBC: 11 10*3/uL — AB (ref 4.0–10.5)
nRBC: 0 % (ref 0.0–0.2)

## 2018-03-25 NOTE — Plan of Care (Signed)
  Problem: Education: Goal: Knowledge of General Education information will improve Description Including pain rating scale, medication(s)/side effects and non-pharmacologic comfort measures Outcome: Progressing   Problem: Clinical Measurements: Goal: Cardiovascular complication will be avoided Outcome: Progressing   Problem: Activity: Goal: Risk for activity intolerance will decrease Outcome: Progressing   Problem: Nutrition: Goal: Adequate nutrition will be maintained Outcome: Progressing   Problem: Coping: Goal: Level of anxiety will decrease Outcome: Progressing   Problem: Elimination: Goal: Will not experience complications related to bowel motility Outcome: Progressing Goal: Will not experience complications related to urinary retention Outcome: Progressing

## 2018-03-25 NOTE — Progress Notes (Signed)
    Subjective: Patient reports pain as moderate to severe.  Tolerating diet.    No CP, SOB.  Not yet OOB.  Objective:   VITALS:   Vitals:   03/24/18 1643 03/24/18 2126 03/25/18 0419 03/25/18 0747  BP: 113/65 (!) 128/57 (!) 100/49 (!) 100/53  Pulse: (!) 116 (!) 101 96 100  Resp: 16 18 (!) 22 16  Temp: 98.2 F (36.8 C) 97.6 F (36.4 C) 98.2 F (36.8 C) 97.7 F (36.5 C)  TempSrc: Oral Oral Oral Oral  SpO2: 100% 97% 98% 98%  Weight:      Height:       CBC Latest Ref Rng & Units 03/25/2018 03/24/2018 02/08/2018  WBC 4.0 - 10.5 K/uL 11.0(H) 10.5 8.3  Hemoglobin 12.0 - 15.0 g/dL 13.1 13.2 14.3  Hematocrit 36.0 - 46.0 % 39.1 41.8 42.5  Platelets 150 - 400 K/uL 210 211 342   BMP Latest Ref Rng & Units 03/25/2018 03/24/2018 07/12/2017  Glucose 70 - 99 mg/dL 117(H) 105(H) 92  BUN 8 - 23 mg/dL 5(L) 6(L) 5(L)  Creatinine 0.44 - 1.00 mg/dL 0.72 0.94 0.56(L)  BUN/Creat Ratio 12 - 28 - - 9(L)  Sodium 135 - 145 mmol/L 135 135 145(H)  Potassium 3.5 - 5.1 mmol/L 4.2 3.6 3.8  Chloride 98 - 111 mmol/L 102 100 104  CO2 22 - 32 mmol/L 23 25 25   Calcium 8.9 - 10.3 mg/dL 7.7(L) 8.1(L) 9.0   Intake/Output      02/27 0701 - 02/28 0700 02/28 0701 - 02/29 0700   P.O. 480    I.V. (mL/kg) 900 (15.9)    IV Piggyback     Total Intake(mL/kg) 1380 (24.3)    Urine (mL/kg/hr) 750 (0.6)    Blood 20    Total Output 770    Net +610            Physical Exam: General: NAD.  Supine in bed.  Calm, conversant.  No increased work of breathing.  MSK LLE: Neurovascularly intact Sensation intact distally Intact pulses distally Dorsiflexion/Plantar flexion intact Incision: dressing C/D/I  Assessment: 1 Day Post-Op  S/P Procedure(s) (LRB): Percutaneous Pinning of Distal Femur (Left) by Dr. Ernesta Amble. Percell Miller on 03/24/2018  Principal Problem:   Femur fracture, left ([place]) Active Problems:   Closed fracture of distal end of femur, unspecified fracture morphology, initial encounter  ([place])   Nondisplaced left distal femur fracture, status post ORIF Stable postop day 1 Not yet mobilized Difficulty with pain control - discussed reporting pain and requesting medicine when needed.  Plan: Up with therapy Incentive Spirometry Elevate and Apply ice  Weightbearing: TDWB LLE Insicional and dressing care: Dressings left intact until follow-up Orthopedic device(s): Knee immobilizer Showering: Keep dressing dry VTE prophylaxis: Lovenox 40mg  qd, SCDs, ambulation Pain control: Tylenol, gabapentin scheduled.  Oxycodone/Dilaudid for breakthrough pain. Follow - up plan: 2 weeks Contact information:  Edmonia Lynch MD, Roxan Hockey PA-C  Dispo: TBD.  Likely SNF.  Therapy evaluations pending.  Previously ambulatory, but has little support and is blind.   Charna Elizabeth Martensen III, PA-C 03/25/2018, 8:12 AM

## 2018-03-25 NOTE — Progress Notes (Signed)
   Subjective: Ms. Pompa was feeling unwell this morning, no acute events overnight. She reports that she is having some left knee pain, she had gotten some pain medications this morning which she reports helped a little but that she still is having pain.She denies any other issues today.        Objective:  Vital signs in last 24 hours: Vitals:   03/24/18 1429 03/24/18 1643 03/24/18 2126 03/25/18 0419  BP: 95/60 113/65 (!) 128/57 (!) 100/49  Pulse: (!) 111 (!) 116 (!) 101 96  Resp: 17 16 18  (!) 22  Temp: 97.7 F (36.5 C) 98.2 F (36.8 C) 97.6 F (36.4 C) 98.2 F (36.8 C)  TempSrc: Oral Oral Oral Oral  SpO2: 93% 100% 97% 98%  Weight:      Height:        General: Frail appearing female, NAD, laying in bed Cardiac: RRR, no m/r/g Pulmonary: CTABL, no wheezing or rhonchi Abdomen: Soft, nontender, nondistended Extremity:Left leg bandaged up, sensation intact, pulses intact, warm extremities  Assessment/Plan:  Principal Problem:   Femur fracture, left (HCC) Active Problems:   Closed fracture of distal end of femur, unspecified fracture morphology, initial encounter [facility])   This is a 73 year old female with a history of legally blind, ischemic colitis status post permanent colostomy, chronic pain secondary to chronic thoracic compression fraction, IDA, and COPD who presented after a mechanical fall at home.  She had imaging that showed a distal left femur fracture.  Orthopedics evaluated and they plan for fixation today.  Left femur fracture: -Left femur x-ray and CT left knee showed nondisplaced intra-articular fracture of the left distal femur. Orthopedics had been consulted and placed her in a knee immobilizer, she is s/p percutaneous pinning of distal femur on 2/27.  She tolerated the procedure well and is still having some intermittent leg pain, has not been asking for pain medications, advised patient to ask for pain medications when she needs it. She has good pulses,  sensation intact and warm extremities. -Orthopedics following, appreciate recommendations -S/p 2 g Ancef for surgical prophylaxis -Continue pain control tylenol and gabapentin scheduled.  -Oxycodone/Dilaudid for break through pain -PT and OT -She will likely require SNF placement on discharge, consult social work -Chiropodist  Vaginal burning: -She reported a 1 day history of vaginal and rectal burning with increased discharge.  As well as some lower abdominal cramping.  She has had abdominal infections in the past.  Today she is still having some abdominal tenderness, she remains afebrile and has no leukocytosis.  -UA showed no signs of infection -Follow-up wet prep -Daily CBC  Chronic pain with long-term opiate use -She takes Norco 7.5-325 mg q8h prn and MS contin 15 mg bid prn at home -Continue fentanyl 50 mcg be every 2 hours -Dilaudid 1 mg every 3 hours as needed for severe pain -OxyIR every 4 for pain control  IDA: -She takes ferrous gluconate 324 mg daily.  Hemoglobin today was 13. -Continue to monitor  COPD: -Continue albuterol as needed  Osteoporosis: -She is already on alendronate 70 mg weekly, will continue this on discharge  FEN: No fluid, replete lytes prn,  regular diet after VTE ppx: Lovenox  Code Status: FULL    Dispo: Anticipated discharge is 1-2 day(s).   Asencion Noble, MD 03/25/2018, 6:31 AM Pager: [phone]

## 2018-03-25 NOTE — Op Note (Signed)
03/24/2018  7:59 AM  PATIENT:  [name] Bell    PRE-OPERATIVE DIAGNOSIS:  left distal femur fracture  POST-OPERATIVE DIAGNOSIS:  Same  PROCEDURE:  Percutaneous Pinning of Distal Femur  SURGEON:  Renette Butters, MD  ASSISTANT: Roxan Hockey, PA-C, he was present and scrubbed throughout the case, critical for completion in a timely fashion, and for retraction, instrumentation, and closure.   ANESTHESIA:   gen  PREOPERATIVE INDICATIONS:  [name] Bell is a  73 y.o. female with a diagnosis of left distal femur fracture who failed conservative measures and elected for surgical management.    The risks benefits and alternatives were discussed with the patient preoperatively including but not limited to the risks of infection, bleeding, nerve injury, cardiopulmonary complications, the need for revision surgery, among others, and the patient was willing to proceed.  OPERATIVE IMPLANTS: biomet cannulated screws  OPERATIVE FINDINGS: stout fixation  BLOOD LOSS: min  COMPLICATIONS: none  TOURNIQUET TIME: none  OPERATIVE PROCEDURE:  Patient was identified in the preoperative holding area and site was marked by me She was transported to the operating theater and placed on the table in supine position taking care to pad all bony prominences. After a preincinduction time out anesthesia was induced. The left lower extremity was prepped and draped in normal sterile fashion and a pre-incision timeout was performed. She received ancef for preoperative antibiotics.   I made a lateral incision over her lateral condylar fracture with intercondylar split.  I incised her joint capsule and evacuated a large hematoma from the knee.  I was then able to palpate her fracture and confirm an open reduction had occurred.  She maintained good alignment.  I then placed 2 cannulated screws over K wires fixing the lateral portion of the supracondylar intercondylar fracture in the place.  I took multiple  x-rays I was very happy with the alignment of the K wires.  K wires prior to screw placement there was no articular penetration.  I then thoroughly irrigated her knee and incision.  I closed her incision in layers sterile dressing was applied she was placed placed back in a knee immobilizer awoken and taken to the PACU in stable condition  POST OPERATIVE PLAN: TDWB, mobilize and chemical dvt px

## 2018-03-25 NOTE — Evaluation (Addendum)
Physical Therapy Evaluation Patient Details Name: [name] Bell MRN: [id number] DOB: 1945-08-21 Today's Date: 03/25/2018   History of Present Illness   73 year old legally blind female with ischemic colitis s/p perm. Colostomy, osteoporosis, chronic pain 2/2 chronic thoracic compression fractures, iron deficiency anemia, and COPD presenting after a fall Left knee, femur and pelvis xray showed nondisplaced intra-articular fracture of the distal femur. s/p Percutaneous pinning of L distal femur.  Clinical Impression  PTA pt living alone with Aide 3 hr/day for help with cooking, cleaning and shopping. Pt ambulated with Rollator at home. Pt currently limited in safe mobility by increased pain, oxygen desaturation ( see General Comments), and decreased strength and endurance. Pt requires modA for bed mobility, and maxA for transfers and ambulation with RW. PT recommending SNF level rehab at d/c to improve strength and mobility. PT will continue to follow acutely.     Follow Up Recommendations SNF    Equipment Recommendations  Other (comment)(TBD at next venue)       Precautions / Restrictions Precautions Precautions: Fall Precaution Comments: fall caused femur fx Required Braces or Orthoses: Knee Immobilizer - Left Restrictions Weight Bearing Restrictions: Yes LLE Weight Bearing: Touchdown weight bearing      Mobility  Bed Mobility Overal bed mobility: Needs Assistance Bed Mobility: Supine to Sit;Sit to Supine     Supine to sit: Mod assist Sit to supine: Mod assist;+2 for physical assistance   General bed mobility comments: mod A for management of LE to floor vc for trunk to upright, and use of bedrails. modAx2 sit to supine for management of LE into bed and square shoulders  Transfers Overall transfer level: Needs assistance Equipment used: 1 person hand held assist;Rolling walker (2 wheeled) Transfers: Sit to/from [employer] Sit to Stand: Mod assist   Squat  pivot transfers: Max assist     General transfer comment: maxA for squat pivot transfer to [facility], and modA for sit>stand from [facility]  Ambulation/Gait Ambulation/Gait assistance: Max assist Gait Distance (Feet): 2 Feet Assistive device: Rolling walker (2 wheeled) Gait Pattern/deviations: Trunk flexed;Antalgic(hop to pattern) Gait velocity: slowed Gait velocity interpretation: <1.31 ft/sec, indicative of household ambulator General Gait Details: pt with difficulty maintaining TDWB, with stepping back to bed from [facility], vc for coming up to ball of R foot to pivot and for UE use       Balance Overall balance assessment: Needs assistance Sitting-balance support: Feet supported;Bilateral upper extremity supported Sitting balance-Leahy Scale: Poor Sitting balance - Comments: requires UE support to maintain balance   Standing balance support: Bilateral upper extremity supported;During functional activity Standing balance-Leahy Scale: Zero Standing balance comment: requires assist to maintain balance                             Pertinent Vitals/Pain Pain Assessment: 0-10 Pain Score: 8  Pain Location: L knee Pain Descriptors / Indicators: Operative site guarding;Grimacing;Guarding;Sore;Aching;Throbbing Pain Intervention(s): Limited activity within patient's tolerance;Monitored during session;Repositioned;Patient requesting pain meds-RN notified    Home Living Family/patient expects to be discharged to:: Skilled nursing facility                      Prior Function Level of Independence: Needs assistance   Gait / Transfers Assistance Needed: uses rollator in home  ADL's / Homemaking Assistance Needed: Aide for dressing/bathing, simple cleaning, and simple cooking. Requiring assistance for visual deficit. Aide drives pt to the grocery store.  Extremity/Trunk Assessment   Upper Extremity Assessment Upper Extremity Assessment: Generalized weakness    Lower  Extremity Assessment Lower Extremity Assessment: Generalized weakness;LLE deficits/detail LLE Deficits / Details: L LE in KI,  LLE: Unable to fully assess due to immobilization;Unable to fully assess due to pain       Communication   Communication: No difficulties  Cognition Arousal/Alertness: Awake/alert Behavior During Therapy: WFL for tasks assessed/performed                                          General Comments General comments (skin integrity, edema, etc.): Pt legally blind. Pt on 2L O2 via [place] 96%O2, on RA 91%O2, BP in supine 74/56, in sitting 138/68, HR 138 bpm SaO2 dropped to 83%O2, 2L O2 returned via [place], able to maintain SaO2>90%O2 throughout remainder of session back in supine 127/79, HR 87 bpm     Assessment/Plan    PT Assessment Patient needs continued PT services  PT Problem List Decreased strength;Decreased range of motion;Decreased activity tolerance;Decreased balance;Decreased mobility;Pain       PT Treatment Interventions DME instruction;Gait training;Functional mobility training;Therapeutic activities;Therapeutic exercise;Balance training;Cognitive remediation;Patient/family education    PT Goals (Current goals can be found in the Care Plan section)  Acute Rehab PT Goals Patient Stated Goal: have less pain  PT Goal Formulation: With patient Time For Goal Achievement: 04/08/18 Potential to Achieve Goals: Fair    Frequency Min 3X/week    AM-PAC PT "6 Clicks" Mobility  Outcome Measure Help needed turning from your back to your side while in a flat bed without using bedrails?: A Little Help needed moving from lying on your back to sitting on the side of a flat bed without using bedrails?: A Lot Help needed moving to and from a bed to a chair (including a wheelchair)?: A Lot Help needed standing up from a chair using your arms (e.g., wheelchair or bedside chair)?: A Lot Help needed to walk in hospital room?: Total Help needed climbing 3-5  steps with a railing? : Total 6 Click Score: 11    End of Session Equipment Utilized During Treatment: Gait belt;Oxygen;Left knee immobilizer Activity Tolerance: Patient limited by pain Patient left: in bed;with call bell/phone within reach;with bed alarm set Nurse Communication: Mobility status;Weight bearing status;Precautions;Patient requests pain meds PT Visit Diagnosis: Unsteadiness on feet (R26.81);Other abnormalities of gait and mobility (R26.89);Muscle weakness (generalized) (M62.81);History of falling (Z91.81);Difficulty in walking, not elsewhere classified (R26.2);Pain Pain - Right/Left: Left Pain - part of body: Knee    Time: 5277-8242 PT Time Calculation (min) (ACUTE ONLY): 28 min   Charges:   PT Evaluation $PT Eval Moderate Complexity: 1 Mod PT Treatments $Gait Training: 8-22 mins        Anahis Furgeson B. Migdalia Dk PT, DPT Acute Rehabilitation Services Pager [phone] Office [phone]   [place] 03/25/2018, 3:21 PM

## 2018-03-25 NOTE — Plan of Care (Signed)

## 2018-03-25 NOTE — Progress Notes (Signed)
CSW acknowledges consult, will follow for PT recommendations.   [place], Mildred

## 2018-03-25 NOTE — NC FL2 (Signed)
[place] MEDICAID FL2 LEVEL OF CARE SCREENING TOOL     IDENTIFICATION  Patient Name: [name] Bell Birthdate: August 13, 1945 Sex: female Admission Date (Current Location): 03/24/2018  [facility] and [place] Number:  Herbalist and Address:  [facility] [place], [place] [address], [place], [place] [address]      Provider Number: [id number]  Attending Physician Name and Address:  Aldine Contes, MD  Relative Name and Phone Number:       Current Level of Care: Hospital Recommended Level of Care: [place] Prior Approval Number:    Date Approved/Denied:   PASRR Number: [id number]  Discharge Plan: SNF    Current Diagnoses: Patient Active Problem List   Diagnosis Date Noted  . Femur fracture, left ([place]) 03/24/2018  . Closed fracture of distal end of femur, unspecified fracture morphology, initial encounter ([place])   . Duodenal ulcer disease 07/27/2017  . Hepatic steatosis 07/03/2017  . Aortic atherosclerosis ([place]) 06/04/2017  . Iron deficiency anemia 06/04/2017  . History of ischemic colitis 06/03/2017  . S/P sigmoid colectomy 06/03/2017  . Back pain, chronic 08/14/2016  . Hair loss 06/24/2016  . Sinus tachycardia 09/11/2015  . Macular degeneration 01/14/2012  . Preventative health care 01/14/2012  . COPD (chronic obstructive pulmonary disease) ([place]) 11/25/2011  . Colostomy present ([place]) 10/27/2011  . Hyperlipidemia 01/24/2009  . Long term (current) use of opiate analgesic 01/03/2009  . Depression 03/08/2008  . Osteoporosis 03/03/2007    Orientation RESPIRATION BLADDER Height & Weight     Self, Time, Situation, Place  O2(Nasal Canula 1.5.) Continent, External catheter Weight: 125 lb (56.7 kg) Height:  5\' 4"  (162.6 cm)  BEHAVIORAL SYMPTOMS/MOOD NEUROLOGICAL BOWEL NUTRITION STATUS  (None) (None) Continent Diet(Regular)  AMBULATORY STATUS COMMUNICATION OF NEEDS Skin   Extensive Assist Verbally Bruising, Surgical wounds                       Personal Care Assistance Level of Assistance  Bathing, Feeding, Dressing Bathing Assistance: Limited assistance Feeding assistance: Limited assistance Dressing Assistance: Limited assistance     Functional Limitations Info  Sight, Hearing, Speech Sight Info: Impaired(Legally blind) Hearing Info: Adequate Speech Info: Adequate    SPECIAL CARE FACTORS FREQUENCY  PT (By licensed PT), OT (By licensed OT)     PT Frequency: 5 x week OT Frequency: 5 x week            Contractures Contractures Info: Not present    Additional Factors Info  Code Status, Allergies, Isolation Precautions Code Status Info: Full Allergies Info: [place] (Diagnostic), Ibuprofen, Nsaids, Latex.     Isolation Precautions Info: Contact: MRSA     Current Medications (03/25/2018):  This is the current hospital active medication list Current Facility-Administered Medications  Medication Dose Route Frequency Provider Last Rate Last Dose  . acetaminophen (TYLENOL) tablet 1,000 mg  1,000 mg Oral Q8H Martensen, Charna Elizabeth III, PA-C   1,000 mg at 03/25/18 1504  . acetaminophen (TYLENOL) tablet 325-650 mg  325-650 mg Oral Q6H PRN Prudencio Burly III, PA-C      . albuterol (PROVENTIL) (2.5 MG/3ML) 0.083% nebulizer solution 2.5 mg  2.5 mg Nebulization Q6H PRN Prudencio Burly III, PA-C   2.5 mg at 03/25/18 1442  . bisacodyl (DULCOLAX) suppository 10 mg  10 mg Rectal Daily PRN Prudencio Burly III, PA-C      . Chlorhexidine Gluconate Cloth 2 % PADS 6 each  6 each Topical Q0600 Prudencio Burly  III, PA-C   6 each at 03/25/18 0552  . diphenhydrAMINE (BENADRYL) 12.5 MG/5ML elixir 12.5-25 mg  12.5-25 mg Oral Q4H PRN Prudencio Burly III, PA-C      . docusate sodium (COLACE) capsule 100 mg  100 mg Oral BID Prudencio Burly III, PA-C   100 mg at 03/25/18 1004  . enoxaparin (LOVENOX) injection 40 mg  40 mg Subcutaneous Q24H Prudencio Burly III, PA-C    40 mg at 03/24/18 1656  . gabapentin (NEURONTIN) capsule 300 mg  300 mg Oral BID Prudencio Burly III, PA-C   300 mg at 03/25/18 1005  . HYDROmorphone (DILAUDID) injection 0.5-1 mg  0.5-1 mg Intravenous Q4H PRN Prudencio Burly III, PA-C   1 mg at 03/25/18 1006  . lactated ringers infusion   Intravenous Continuous Prudencio Burly III, PA-C 100 mL/hr at 03/24/18 1500    . methocarbamol (ROBAXIN) tablet 500 mg  500 mg Oral Q6H PRN Prudencio Burly III, PA-C   500 mg at 03/25/18 1005   Or  . methocarbamol (ROBAXIN) 500 mg in dextrose 5 % 50 mL IVPB  500 mg Intravenous Q6H PRN Prudencio Burly III, PA-C      . metoCLOPramide (REGLAN) tablet 5-10 mg  5-10 mg Oral Q8H PRN Prudencio Burly III, PA-C       Or  . metoCLOPramide (REGLAN) injection 5-10 mg  5-10 mg Intravenous Q8H PRN Prudencio Burly III, PA-C      . mupirocin ointment (BACTROBAN) 2 % 1 application  1 application Nasal BID Prudencio Burly III, PA-C   1 application at 03/09/15 1005  . ondansetron (ZOFRAN) tablet 4 mg  4 mg Oral Q6H PRN Prudencio Burly III, PA-C   4 mg at 03/25/18 0007   Or  . ondansetron [facility]) injection 4 mg  4 mg Intravenous Q6H PRN Prudencio Burly III, PA-C      . oxyCODONE (Oxy IR/ROXICODONE) immediate release tablet 5-10 mg  5-10 mg Oral Q4H PRN Prudencio Burly III, PA-C   10 mg at 03/25/18 1335  . polyethylene glycol (MIRALAX / GLYCOLAX) packet 17 g  17 g Oral Daily PRN Prudencio Burly III, PA-C         Discharge Medications: Please see discharge summary for a list of discharge medications.  Relevant Imaging Results:  Relevant Lab Results:   Additional Information SS#: [id number]  Candie Chroman, LCSW

## 2018-03-25 NOTE — Clinical Social Work Note (Signed)
Clinical Social Work Assessment  Patient Details  Name: [name] Bell MRN: [id number] Date of Birth: 1945-05-14  Date of referral:  03/25/18               Reason for consult:  Discharge Planning                Permission sought to share information with:  Case Manager Permission granted to share information::  Yes, Verbal Permission Granted  Name::     Pt denies any family at this time  Agency::  All SNF's  Relationship::     Contact Information:     Housing/Transportation Living arrangements for the past 2 months:  Apartment Source of Information:  Patient Patient Interpreter Needed:  None Criminal Activity/Legal Involvement Pertinent to Current Situation/Hospitalization:  No - Comment as needed Significant Relationships:  Friend Lives with:  Self Do you feel safe going back to the place where you live?  Yes Need for family participation in patient care:  No (Coment)  Care giving concerns:    Patient is legally blind. Patient has been referred to SNF by PT.    Social Worker assessment / plan:   CSW entered the room. The patient was lying in the bed but was alert and oriented. CSW introduced herself and explained her role. The patient is familiar with skilled nursing and has stayed at [facility] before. She had no issues or concerns with returning back to [place]. CSW went over other facilities in the area due to the patient being blind and unable to read the CMS SNF list. Patient reported that she does not have any living family in the area. Patient reported that she does have friends but they currently do not have any transportation. Patient gave CSW permission to fax her out to other facilities in the area. Patient has a preference of [name].   Employment status:  Retired Nurse, adult PT Recommendations:  [place] / Referral to community resources:  [place]  Patient/Family's Response to care:   Patient is understanding of her current medical condition.   Patient/Family's Understanding of and Emotional Response to Diagnosis, Current Treatment, and Prognosis: Patient is appreciative of the CSW assisting her with finding a placement. No other concerns at this time.   Emotional Assessment Appearance:  Appears stated age Attitude/Demeanor/Rapport:  Unable to Assess Affect (typically observed):  Unable to Assess Orientation:  Oriented to Self, Oriented to Place, Oriented to  Time, Oriented to Situation Alcohol / Substance use:  Not Applicable Psych involvement (Current and /or in the community):  No (Comment)  Discharge Needs  Concerns to be addressed:  Discharge Planning Concerns Readmission within the last 30 days:  No Current discharge risk:  Dependent with Mobility, Other(Pt is legally blind) Barriers to Discharge:  [place], [place] 03/25/2018, 3:49 PM

## 2018-03-25 NOTE — Progress Notes (Signed)
Internal Medicine Attending:   I saw and examined the patient. I reviewed the resident's note and I agree with the resident's findings and plan as documented in the resident's note.  Patient states that she has persistent pain in her left knee and that the pain medications have not relieved her pain much.  Patient was initially made to the hospital with a distal left femur fracture and is now status post percutaneous pinning of distal fracture postop day #1.  Ortho follow-up and recommendations appreciated.  We will continue with pain control for now.  PT follow-up and recommendations appreciated.  Patient to be DC'd to SNF when bed is available.  Continue with incentive spirometer.  No further work-up for now.  Patient stable for DC to SNF once bed is available.

## 2018-03-26 ENCOUNTER — Inpatient Hospital Stay (HOSPITAL_COMMUNITY): Payer: Medicare Other

## 2018-03-26 LAB — CBC
HCT: 39.1 % (ref 36.0–46.0)
Hemoglobin: 12.7 g/dL (ref 12.0–15.0)
MCH: 30.4 pg (ref 26.0–34.0)
MCHC: 32.5 g/dL (ref 30.0–36.0)
MCV: 93.5 fL (ref 80.0–100.0)
PLATELETS: 233 10*3/uL (ref 150–400)
RBC: 4.18 MIL/uL (ref 3.87–5.11)
RDW: 14.6 % (ref 11.5–15.5)
WBC: 10.4 10*3/uL (ref 4.0–10.5)
nRBC: 0 % (ref 0.0–0.2)

## 2018-03-26 LAB — BASIC METABOLIC PANEL
Anion gap: 8 (ref 5–15)
BUN: 6 mg/dL — ABNORMAL LOW (ref 8–23)
CALCIUM: 7.7 mg/dL — AB (ref 8.9–10.3)
CO2: 29 mmol/L (ref 22–32)
Chloride: 102 mmol/L (ref 98–111)
Creatinine, Ser: 0.68 mg/dL (ref 0.44–1.00)
GFR calc Af Amer: >60 mL/min (ref >60)
GFR calc non Af Amer: >60 mL/min (ref >60)
Glucose, Bld: 89 mg/dL (ref 70–99)
Potassium: 4.1 mmol/L (ref 3.5–5.1)
SODIUM: 139 mmol/L (ref 135–145)

## 2018-03-26 LAB — C DIFFICILE QUICK SCREEN W PCR REFLEX
C DIFFICLE (CDIFF) ANTIGEN: NEGATIVE
C Diff interpretation: NOT DETECTED
C Diff toxin: NEGATIVE

## 2018-03-26 MED ORDER — IOHEXOL 300 MG/ML  SOLN
100.0000 mL | Freq: Once | INTRAMUSCULAR | Status: AC | PRN
  Administered 2018-03-26: 100 mL via INTRAVENOUS

## 2018-03-26 MED ORDER — HYDROCODONE-ACETAMINOPHEN 7.5-325 MG PO TABS
1.0000 | ORAL_TABLET | Freq: Three times a day (TID) | ORAL | Status: DC | PRN
  Administered 2018-03-26 – 2018-03-28 (×6): 1 via ORAL
  Filled 2018-03-26 (×6): qty 1

## 2018-03-26 MED ORDER — MORPHINE SULFATE ER 15 MG PO TBCR
15.0000 mg | EXTENDED_RELEASE_TABLET | Freq: Two times a day (BID) | ORAL | Status: DC
  Administered 2018-03-26 – 2018-03-28 (×5): 15 mg via ORAL
  Filled 2018-03-26 (×5): qty 1

## 2018-03-26 NOTE — Progress Notes (Signed)
  Date: 03/26/2018  Patient name: [name] Bell  Medical record number: [id number]  Date of birth: 05-21-1945   This patient's plan of care was discussed with the house staff. Please see Dr. Dorothyann Peng note for complete details. I concur with her findings and plan.   Sid Falcon, MD 03/26/2018, 8:40 PM

## 2018-03-26 NOTE — Evaluation (Signed)
Occupational Therapy Evaluation Patient Details Name: [name] Bell MRN: [id number] DOB: 1945/10/09 Today's Date: 03/26/2018    History of Present Illness  73 year old legally blind female with ischemic colitis s/p perm. Colostomy, osteoporosis, chronic pain 2/2 chronic thoracic compression fractures, iron deficiency anemia, and COPD presenting after a fall Left knee, femur and pelvis xray showed nondisplaced intra-articular fracture of the distal femur. s/p Percutaneous pinning of L distal femur.   Clinical Impression   PTA: pt living home alone with aids for 3 hours daily for ADL/IADL needs. Legally blind at baseline. Currently,  Pt performing ADLs with increased assist as LLE is in [place] and TDWB. Pt nauseous and unable to perform functional tasks without dry heaving. Pt rolling in bed with maxA and scooting to [facility] with maxA and use of RLE. Pt requires continued OT skilled services for ADL, mobility and safety in SNF setting as pt unable to care for self. OT to follow acutely.    Follow Up Recommendations  SNF;Supervision/Assistance - 24 hour    Equipment Recommendations  Other (comment)(TBD)    Recommendations for Other Services       Precautions / Restrictions Precautions Precautions: Fall Precaution Comments: fall caused femur fx Required Braces or Orthoses: Knee Immobilizer - Left Restrictions Weight Bearing Restrictions: Yes LLE Weight Bearing: Touchdown weight bearing      Mobility Bed Mobility Overal bed mobility: Needs Assistance Bed Mobility: Rolling Rolling: Max assist         General bed mobility comments: maxA for bed mobility- pt scooting towards HOB with RLE pushing down and OTR assisting  Transfers                      Balance               Standing balance comment: pt too nauseous to perform task                           ADL either performed or assessed with clinical judgement   ADL Overall ADL's : Needs  assistance/impaired Eating/Feeding: Set up;Bed level   Grooming: Set up;Bed level   Upper Body Bathing: Minimal assistance;Bed level   Lower Body Bathing: Maximal assistance;Bed level   Upper Body Dressing : Minimal assistance;Bed level   Lower Body Dressing: Maximal assistance;Bed level               Functional mobility during ADLs: (unable to test as pt too nauseous to perform ) General ADL Comments: requires increased assist as LLE is in [place].     Vision Baseline Vision/History: Legally blind Patient Visual Report: No change from baseline       Perception     Praxis      Pertinent Vitals/Pain Pain Assessment: 0-10 Pain Score: 8  Pain Location: L knee Pain Descriptors / Indicators: Operative site guarding;Grimacing;Guarding;Sore;Aching;Throbbing Pain Intervention(s): Limited activity within patient's tolerance     Hand Dominance Right   Extremity/Trunk Assessment Upper Extremity Assessment Upper Extremity Assessment: Generalized weakness   Lower Extremity Assessment Lower Extremity Assessment: Defer to PT evaluation;Overall WFL for tasks assessed LLE Deficits / Details: L LE in [place],  LLE: Unable to fully assess due to immobilization;Unable to fully assess due to pain       Communication Communication Communication: No difficulties   Cognition Arousal/Alertness: Lethargic Behavior During Therapy: Anxious Overall Cognitive Status: Within Functional Limits for tasks assessed  General Comments: not feeling well- nauseous so pt was distracted.   General Comments  legally blind- can see some things, just not well.    Exercises     Shoulder Instructions      Home Living Family/patient expects to be discharged to:: Skilled nursing facility Living Arrangements: Alone                                      Prior Functioning/Environment Level of Independence: Needs assistance  Gait / Transfers  Assistance Needed: uses rollator in home ADL's / Homemaking Assistance Needed: Aide for dressing/bathing, simple cleaning, and simple cooking. Requiring assistance for visual deficit. Aide drives pt to the grocery store.            OT Problem List: Decreased strength;Decreased activity tolerance;Impaired balance (sitting and/or standing);Decreased safety awareness;Decreased coordination;Pain      OT Treatment/Interventions: Self-care/ADL training;Therapeutic exercise;Neuromuscular education;Energy conservation;DME and/or AE instruction;Therapeutic activities;Patient/family education;Balance training    OT Goals(Current goals can be found in the care plan section) Acute Rehab OT Goals Patient Stated Goal: have less pain  OT Goal Formulation: With patient Time For Goal Achievement: 04/09/18 Potential to Achieve Goals: Good ADL Goals Pt Will Perform Grooming: with set-up Pt Will Perform Lower Body Dressing: with set-up Pt Will Transfer to Toilet: with set-up Pt Will Perform Toileting - Clothing Manipulation and hygiene: with set-up;sit to/from stand Additional ADL Goal #1: Pt will perform ADL functional transfers with modified independence.  OT Frequency: Min 2X/week   Barriers to D/C: Decreased caregiver support  no 24/7 care       Co-evaluation              AM-PAC OT "6 Clicks" Daily Activity     Outcome Measure Help from another person eating meals?: None Help from another person taking care of personal grooming?: A Little Help from another person toileting, which includes using toliet, bedpan, or urinal?: Total Help from another person bathing (including washing, rinsing, drying)?: A Lot Help from another person to put on and taking off regular upper body clothing?: A Lot Help from another person to put on and taking off regular lower body clothing?: A Lot 6 Click Score: 14   End of Session Nurse Communication: Mobility status  Activity Tolerance: Patient limited by  pain;Treatment limited secondary to medical complications (Comment) Patient left: in bed;with call bell/phone within reach;with bed alarm set  OT Visit Diagnosis: Unsteadiness on feet (R26.81);Muscle weakness (generalized) (M62.81)                Time: 8381-8403 OT Time Calculation (min): 19 min Charges:  OT General Charges $OT Visit: 1 Visit OT Evaluation $OT Eval Moderate Complexity: 1 Mod  Darryl Nestle) Marsa Aris OTR/L Acute Rehabilitation Services Pager: [phone] Office: [phone]   Fredda Hammed 03/26/2018, 2:58 PM

## 2018-03-26 NOTE — Plan of Care (Signed)
  Problem: Education: Goal: Knowledge of General Education information will improve Description Including pain rating scale, medication(s)/side effects and non-pharmacologic comfort measures Outcome: Progressing   Problem: Clinical Measurements: Goal: Ability to maintain clinical measurements within normal limits will improve Outcome: Progressing   Problem: Nutrition: Goal: Adequate nutrition will be maintained Outcome: Progressing   Problem: Coping: Goal: Level of anxiety will decrease Outcome: Not Progressing

## 2018-03-26 NOTE — Progress Notes (Addendum)
Subjective: [name] Bell was feeling okay this morning, no acute events overnight. She reports that she is still having some leg pain, as well as neck and shoulder pain that is chronic. She also reports that she has been having an increased output into her colostomy bag and some leaking of stool from her rectum, she reports that she will often get this when she has an abdominal infection. She denies any other issues. We discussed the plan for today and she is in agreement.       Objective:  Vital signs in last 24 hours: Vitals:   03/25/18 1513 03/25/18 1706 03/25/18 2027 03/26/18 0436  BP: 127/79 (!) 103/43 123/65 (!) 110/51  Pulse: (!) 122 (!) 115 (!) 115 98  Resp: 20 20 18 18   Temp:   (!) 97.4 F (36.3 C) 98.2 F (36.8 C)  TempSrc:   Oral Oral  SpO2: 96% 97% 100% 100%  Weight:      Height:        General: Frail appearing female, NAD, laying in bed Cardiac: RRR, no m/r/g Pulmonary: CTABL, normal work of breathing Abdomen:Soft, mild TTP, nondistended, colostomy bag in place Extremity: Left leg in binder, warm extremities, good pulses, sensation intact  Assessment/Plan:  Principal Problem:   Femur fracture, left (HCC) Active Problems:   Closed fracture of distal end of femur, unspecified fracture morphology, initial encounter [facility])   This is a 73 year old female with a history of legally blind, ischemic colitis status post permanent colostomy, chronic pain secondary to chronic thoracic compression fraction, IDA, and COPD who presented after a mechanical fall at home.  She had imaging that showed a distal left femur fracture.  Orthopedics evaluated and they plan for fixation today.  Left femur fracture: -Left femur x-ray and CT left knee showed nondisplaced intra-articular fracture of the left distal femur. S/p percutaneous pinning of distal femur on 2/27. She tolerated the procedure well, she is still having leg pain however that the pain medications help. She has good pulses,  sensation intact, and warm extremities.  -Orthopedics following, appreciate recommendations -Continue pain control tylenol and gabapentin scheduled.  -Switch oxycodone to home medications Norco PRN and MS Contin twice daily as needed -Continue dilaudid 0.5-1 mg q4 hours for break through pain -PT recommended SNF, OT eval pending -Social work assisting with SNF placement -Incentive spirometer  Vaginal/rectal, leaking stool: -She reported a 1 day history of vaginal and rectal burning with increased discharge.  As well as some lower abdominal cramping.  She has had colitis in the past and reports that she feels like she has in infection due to the increased output into the colostomy bag and the leaking of stool. She remains afebrile with no leukocytosis, she has mild tenderness to palpation of her abdomen.  -UA showed no signs of infection -Wet prep was unremarkable, few bacteria seen -CT abd/pelvis  -Daily CBC  Chronic pain with long-term opiate use -She takes Norco 7.5-325 mg q8h prn and MS contin 15 mg bid prn at home, will restart these today -Continue Dilaudid 1 mg every 3 hours as needed for severe pain -Discontinue OxyIR every 4 for pain control  IDA: -She takes ferrous gluconate 324 mg daily.  Hemoglobin today was 13. -Continue to monitor  COPD: -Continue albuterol as needed  Osteoporosis: -She is already on alendronate 70 mg weekly, will continue this on discharge  FEN: No fluid, replete lytes prn,  regular diet after VTE ppx: Lovenox  Code Status: FULL  Dispo: Anticipated discharge is pending SNF placement  Asencion Noble, MD 03/26/2018, 6:35 AM Pager: [phone]

## 2018-03-26 NOTE — Progress Notes (Signed)
Orthopaedic Tra[facility] Cross Cover Progress Note  S: Patient still having a lot of pain, takes baseline narcotics which make pain control issue in hospital. Prescribed these from internal medicine primary care physician  O:  Vitals:   03/25/18 2027 03/26/18 0436  BP: 123/65 (!) 110/51  Pulse: (!) 115 98  Resp: 18 18  Temp: (!) 97.4 F (36.3 C) 98.2 F (36.8 C)  SpO2: 100% 100%    Gen: NAD,AAOx3 LLE: Knee immobilizer in place, neurovascularly intact Compartments soft and compressible  Imaging: Stable   Labs:  Results for orders placed or performed during the hospital encounter of 03/24/18 (from the past 24 hour(s))  CBC     Status: None   Collection Time: 03/26/18  3:07 AM  Result Value Ref Range   WBC 10.4 4.0 - 10.5 K/uL   RBC 4.18 3.87 - 5.11 MIL/uL   Hemoglobin 12.7 12.0 - 15.0 g/dL   HCT 39.1 36.0 - 46.0 %   MCV 93.5 80.0 - 100.0 fL   MCH 30.4 26.0 - 34.0 pg   MCHC 32.5 30.0 - 36.0 g/dL   RDW 14.6 11.5 - 15.5 %   Platelets 233 150 - 400 K/uL   nRBC 0.0 0.0 - 0.2 %  Basic metabolic panel     Status: Abnormal   Collection Time: 03/26/18  3:07 AM  Result Value Ref Range   Sodium 139 135 - 145 mmol/L   Potassium 4.1 3.5 - 5.1 mmol/L   Chloride 102 98 - 111 mmol/L   CO2 29 22 - 32 mmol/L   Glucose, Bld 89 70 - 99 mg/dL   BUN 6 (L) 8 - 23 mg/dL   Creatinine, Ser 0.68 0.44 - 1.00 mg/dL   Calcium 7.7 (L) 8.9 - 10.3 mg/dL   GFR calc non Af Amer >60 >60 mL/min   GFR calc Af Amer >60 >60 mL/min   Anion gap 8 5 - 15    Assessment: 73 yo female s/p fall  Injuries: L distal femur fracture treated with ORIF   Weightbearing: TDWB  Insicional and dressing care: To remain in place until follow up  Orthopedic device(s):Knee immobilizer  CV/Blood loss:Hgb 12.7 this AM. Stable  Pain management: 1. Scheduled tylenol 1 gm q 8 hours 2. Gabapentin 300 mg BID 3. Dilaudid 0.5-1mg  q 4 hours PRN 4. Robaxin 500 mg q 6 hours PRN 5.  Oxycodone 5-10 mg q 4 hours PRN  Will restart  MS contin 15 mg BID as is patients home dose. Her MMEs are less now than at home, would consider increasing oxycodone as well if does not improve  VTE prophylaxis: Lovenox 40 mg  ID: None currently  Foley/Lines: 100 ml/hr, KVO when tolerating PO  Medical co-morbidities: COPD-albuterol prn osteoporosis  Dispo: PT/OT, plan for SNF  Follow - up plan: Dr Percell Miller per his recommendations   Shona Needles, MD [facility] [phone] (phone)

## 2018-03-27 DIAGNOSIS — W19XXXA Unspecified fall, initial encounter: Secondary | ICD-10-CM

## 2018-03-27 DIAGNOSIS — G894 Chronic pain syndrome: Secondary | ICD-10-CM

## 2018-03-27 NOTE — Plan of Care (Signed)
  Problem: Pain Managment: Goal: General experience of comfort will improve Outcome: Progressing   Problem: Safety: Goal: Ability to remain free from injury will improve Outcome: Progressing   

## 2018-03-27 NOTE — Progress Notes (Signed)
   Subjective: [name] Bell reported that she was continuing to have abdominal pain today. She feels her bowel movements are sticky in the colostomy bag and continues to notice vaginal mucus and leaking stool from her rectum. She changed her diet to a liquid diet due to pain. She denies n/v. She was informed her CT scan showed no signs of colitis and she has no other signs of infection.   Objective:  Vital signs in last 24 hours: Vitals:   03/26/18 1202 03/26/18 1243 03/26/18 2035 03/27/18 0450  BP:  118/64 112/71 (!) 129/52  Pulse: (!) 130 (!) 112 (!) 110 99  Resp: 18 17 18 18   Temp:  (!) 97.5 F (36.4 C) 98.5 F (36.9 C) 97.8 F (36.6 C)  TempSrc:  Oral Oral Oral  SpO2:  99% 97% 97%  Weight:      Height:       Gen: seen comfortably lying in bed, no distress Skin: warm and dry Genital: no mucus or discharge appreciated on vaginal exam, clean and dry Ext: left leg in binder, good pulses  Psych: normal mood and affect  CT Abdomen pelvis IMPRESSION: 1. Marked hepatic steatosis. 2. Cholelithiasis. 3. Patchy ground-glass opacities within the RIGHT lung base, likely infectious or inflammatory. 4. Large parastomal hernia containing nondilated loops of colon. No bowel obstruction. 5. Status post Hartmann's pouch and descending colostomy. No evidence for rectovaginal fistula. 6. Residual mild edema of the rectum, improved since June. 7. Diffuse body wall edema. 8. Superior endplate fracture of I20, chronic. 9.  Aortic atherosclerosis.  (ICD10-I70.0)  Assessment/Plan:  Principal Problem:   Femur fracture, left (HCC) Active Problems:   Closed fracture of distal end of femur, unspecified fracture morphology, initial encounter [facility])  [name] Bell is a 73 year old female with a history of legally blind, ischemic colitis status post permanent colostomy, chronic pain secondary to chronic thoracic compression fraction, IDA, and COPD who presented after a mechanical fall at home. She  had imaging that showed a distal left femur fracture. Orthopedics treated with an ORIF.  Left femur fracture: - S/p percutaneous pinning of distal femur on 2/27. She tolerated the procedure well, she is still having leg pain however that the pain medications help. She has good pulses, sensation intact, and warm extremities.  -Orthopedics following, appreciate recommendations -Continue home pain medications- Norco PRN and MS Contin twice daily as needed, gabapentin and tylenol  -Continue dilaudid 0.5-1 mg q4 hours for break through pain -Social work assisting with SNF placement  Vaginal/rectal, leaking stool: -She reported a 1 day history of vaginal and rectal burning with increased discharge. As well as some lower abdominal cramping. She has had colitis in the past and reports that she feels like she has in infection due to the increased output into the colostomy bag and the leaking of stool. She remains afebrile with no leukocytosis, she has mild tenderness to palpation of her abdomen.  -UA showed no signs of infection -Wet prep was unremarkable, few bacteria seen -CT abdomen showed no evidence of colitis, diffuse body wall edema -Informed RN to take a look at discharge if patient reports any more episodes to determine if there is bleeding or discoloration   Chronic pain with long-term opiate use -Continue Norco7.5-325 mg q8h prn and MS contin 15 mg bid prnat home -Continue Dilaudid 1 mg every 3 hours as needed for severe pain   Dispo: Anticipated discharge pending SNF placement.  Mike Craze, DO 03/27/2018, 6:44 AM Pager: [phone]

## 2018-03-27 NOTE — Progress Notes (Signed)
  Date: 03/27/2018  Patient name: [name] Bell  Medical record number: [id number]  Date of birth: January 07, 1946   I have seen and evaluated this patient and I have discussed the plan of care with the house staff. Please see Dr. Olevia Perches note for complete details. I concur with her findings and plan.    Patient is blind.  If vaginal discharge is bloody, would consider a work up for AUB.    Sid Falcon, MD 03/27/2018, 7:02 PM

## 2018-03-27 NOTE — Plan of Care (Signed)

## 2018-03-28 ENCOUNTER — Inpatient Hospital Stay (HOSPITAL_COMMUNITY): Payer: Medicare Other

## 2018-03-28 DIAGNOSIS — Z79891 Long term (current) use of opiate analgesic: Secondary | ICD-10-CM

## 2018-03-28 DIAGNOSIS — Z9181 History of falling: Secondary | ICD-10-CM | POA: Diagnosis not present

## 2018-03-28 DIAGNOSIS — G8929 Other chronic pain: Secondary | ICD-10-CM

## 2018-03-28 DIAGNOSIS — M4854XA Collapsed vertebra, not elsewhere classified, thoracic region, initial encounter for fracture: Secondary | ICD-10-CM | POA: Diagnosis not present

## 2018-03-28 DIAGNOSIS — S72465D Nondisplaced supracondylar fracture with intracondylar extension of lower end of left femur, subsequent encounter for closed fracture with routine healing: Secondary | ICD-10-CM | POA: Diagnosis not present

## 2018-03-28 DIAGNOSIS — F329 Major depressive disorder, single episode, unspecified: Secondary | ICD-10-CM | POA: Diagnosis not present

## 2018-03-28 DIAGNOSIS — H548 Legal blindness, as defined in USA: Secondary | ICD-10-CM

## 2018-03-28 DIAGNOSIS — Z6821 Body mass index (BMI) 21.0-21.9, adult: Secondary | ICD-10-CM

## 2018-03-28 DIAGNOSIS — R0602 Shortness of breath: Secondary | ICD-10-CM | POA: Diagnosis not present

## 2018-03-28 DIAGNOSIS — Z91040 Latex allergy status: Secondary | ICD-10-CM

## 2018-03-28 DIAGNOSIS — W19XXXA Unspecified fall, initial encounter: Secondary | ICD-10-CM | POA: Diagnosis not present

## 2018-03-28 DIAGNOSIS — R079 Chest pain, unspecified: Secondary | ICD-10-CM

## 2018-03-28 DIAGNOSIS — M79675 Pain in left toe(s): Secondary | ICD-10-CM | POA: Diagnosis not present

## 2018-03-28 DIAGNOSIS — Z933 Colostomy status: Secondary | ICD-10-CM

## 2018-03-28 DIAGNOSIS — M6281 Muscle weakness (generalized): Secondary | ICD-10-CM | POA: Diagnosis not present

## 2018-03-28 DIAGNOSIS — Z433 Encounter for attention to colostomy: Secondary | ICD-10-CM | POA: Diagnosis not present

## 2018-03-28 DIAGNOSIS — K559 Vascular disorder of intestine, unspecified: Secondary | ICD-10-CM | POA: Diagnosis not present

## 2018-03-28 DIAGNOSIS — S7292XS Unspecified fracture of left femur, sequela: Secondary | ICD-10-CM | POA: Diagnosis not present

## 2018-03-28 DIAGNOSIS — Z9189 Other specified personal risk factors, not elsewhere classified: Secondary | ICD-10-CM | POA: Diagnosis not present

## 2018-03-28 DIAGNOSIS — N898 Other specified noninflammatory disorders of vagina: Secondary | ICD-10-CM

## 2018-03-28 DIAGNOSIS — K6289 Other specified diseases of anus and rectum: Secondary | ICD-10-CM

## 2018-03-28 DIAGNOSIS — R091 Pleurisy: Secondary | ICD-10-CM | POA: Diagnosis not present

## 2018-03-28 DIAGNOSIS — J441 Chronic obstructive pulmonary disease with (acute) exacerbation: Secondary | ICD-10-CM

## 2018-03-28 DIAGNOSIS — B351 Tinea unguium: Secondary | ICD-10-CM | POA: Diagnosis not present

## 2018-03-28 DIAGNOSIS — M80052A Age-related osteoporosis with current pathological fracture, left femur, initial encounter for fracture: Secondary | ICD-10-CM | POA: Diagnosis not present

## 2018-03-28 DIAGNOSIS — S72409A Unspecified fracture of lower end of unspecified femur, initial encounter for closed fracture: Secondary | ICD-10-CM | POA: Diagnosis not present

## 2018-03-28 DIAGNOSIS — E440 Moderate protein-calorie malnutrition: Secondary | ICD-10-CM | POA: Diagnosis not present

## 2018-03-28 DIAGNOSIS — E46 Unspecified protein-calorie malnutrition: Secondary | ICD-10-CM | POA: Diagnosis not present

## 2018-03-28 DIAGNOSIS — D509 Iron deficiency anemia, unspecified: Secondary | ICD-10-CM | POA: Diagnosis not present

## 2018-03-28 DIAGNOSIS — Z91018 Allergy to other foods: Secondary | ICD-10-CM

## 2018-03-28 DIAGNOSIS — M5136 Other intervertebral disc degeneration, lumbar region: Secondary | ICD-10-CM | POA: Diagnosis not present

## 2018-03-28 DIAGNOSIS — M8448XD Pathological fracture, other site, subsequent encounter for fracture with routine healing: Secondary | ICD-10-CM

## 2018-03-28 DIAGNOSIS — Z9981 Dependence on supplemental oxygen: Secondary | ICD-10-CM

## 2018-03-28 DIAGNOSIS — M79674 Pain in right toe(s): Secondary | ICD-10-CM | POA: Diagnosis not present

## 2018-03-28 DIAGNOSIS — Z886 Allergy status to analgesic agent status: Secondary | ICD-10-CM

## 2018-03-28 DIAGNOSIS — I959 Hypotension, unspecified: Secondary | ICD-10-CM | POA: Diagnosis not present

## 2018-03-28 DIAGNOSIS — R1312 Dysphagia, oropharyngeal phase: Secondary | ICD-10-CM | POA: Diagnosis not present

## 2018-03-28 DIAGNOSIS — S79929A Unspecified injury of unspecified thigh, initial encounter: Secondary | ICD-10-CM | POA: Diagnosis not present

## 2018-03-28 DIAGNOSIS — J90 Pleural effusion, not elsewhere classified: Secondary | ICD-10-CM | POA: Diagnosis not present

## 2018-03-28 DIAGNOSIS — M810 Age-related osteoporosis without current pathological fracture: Secondary | ICD-10-CM | POA: Diagnosis not present

## 2018-03-28 DIAGNOSIS — R262 Difficulty in walking, not elsewhere classified: Secondary | ICD-10-CM | POA: Diagnosis not present

## 2018-03-28 DIAGNOSIS — R9389 Abnormal findings on diagnostic imaging of other specified body structures: Secondary | ICD-10-CM | POA: Diagnosis not present

## 2018-03-28 DIAGNOSIS — S72402A Unspecified fracture of lower end of left femur, initial encounter for closed fracture: Secondary | ICD-10-CM | POA: Diagnosis not present

## 2018-03-28 DIAGNOSIS — G894 Chronic pain syndrome: Secondary | ICD-10-CM | POA: Diagnosis not present

## 2018-03-28 MED ORDER — IPRATROPIUM-ALBUTEROL 0.5-2.5 (3) MG/3ML IN SOLN
3.0000 mL | Freq: Four times a day (QID) | RESPIRATORY_TRACT | Status: DC
  Administered 2018-03-28: 3 mL via RESPIRATORY_TRACT
  Filled 2018-03-28: qty 3

## 2018-03-28 MED ORDER — IPRATROPIUM-ALBUTEROL 0.5-2.5 (3) MG/3ML IN SOLN: 3.0000 mL | Freq: Four times a day (QID) | RESPIRATORY_TRACT | Status: DC

## 2018-03-28 MED ORDER — IPRATROPIUM-ALBUTEROL 0.5-2.5 (3) MG/3ML IN SOLN: 3.0000 mL | Freq: Four times a day (QID) | RESPIRATORY_TRACT | 0 refills | Status: DC

## 2018-03-28 MED ORDER — BOOST / RESOURCE BREEZE PO LIQD CUSTOM: 1.0000 | Freq: Three times a day (TID) | ORAL | Status: DC

## 2018-03-28 NOTE — Progress Notes (Signed)
Initial Nutrition Assessment  DOCUMENTATION CODES:   Non-severe (moderate) malnutrition in context of chronic illness  INTERVENTION:   Boost Breeze TID (each provides 250 kcal, 9 g protein)   NUTRITION DIAGNOSIS:   Moderate Malnutrition related to chronic illness as evidenced by energy intake < 75% for > or equal to 3 months, moderate fat depletion, moderate muscle depletion.   GOAL:   Patient will meet greater than or equal to 90% of their needs   MONITOR:   PO intake, Supplement acceptance, Weight trends, Labs, I & O's  REASON FOR ASSESSMENT:   Consult Assessment of nutrition requirement/status  ASSESSMENT:   73 yo female, admitted with L femur fx. PMH significant for basal cell carcinoma, bleeding ulcer, blind in both eyes, degenerative disk disease, duodenal ulcer (2008), ischemic colitis (2013), perforated bowel s/p surgery (2013).  Labs: BUN low, corrected Ca WNL Meds: Colace  Pt alert and pleasant, resting in bed at time of visit. Endorses good appetite, slightly less than usual. UBW 130-135 lbs, unsure of last time she weighed this amount. Pt states she sometimes backs off on regular food when her stomach is upset - has colostomy, h/o GI issues. Pt visually appears thin, clavicle bones prominent. Denies nausea, vomiting, diarrhea, or constipation. Encouraged pt to include protein-rich foods with all meals and snacks, and to eat those foods first if not feeling hungry. Pt avoids milk-based protein supplements and requested Boost Breeze TID in peach.  NUTRITION - FOCUSED PHYSICAL EXAM:   Most Recent Value  Orbital Region  Moderate depletion  Upper Arm Region  Mild depletion  Thoracic and Lumbar Region  Mild depletion  Buccal Region  Moderate depletion  Temple Region  Mild depletion  Clavicle Bone Region  Moderate depletion  Clavicle and Acromion Bone Region  Moderate depletion  Scapular Bone Region  Moderate depletion  Dorsal Hand  Moderate depletion  Patellar  Region  Unable to assess  Anterior Thigh Region  Unable to assess  Posterior Calf Region  Unable to assess  Edema (RD Assessment)  Unable to assess  Hair  Other (Comment) falling out, per pt  Eyes  Reviewed  Mouth  Reviewed  Skin  Other (Comment) dry, per pt  Nails  Other (Comment) thin, brittle, per pt     Diet Order:  No known food allergies Diet Order            Diet regular Room service appropriate? Yes with Assist; Fluid consistency: Thin  Diet effective now            PO Intake: 2/27-3/1: 75-100%, noted 0% x 3 meals on 2/29  EDUCATION NEEDS:  Education needs have been addressed  Skin:  Skin Assessment: Skin Integrity Issues: Skin Integrity Issues:: Other (Comment) Other: ecchymosis L face, hip  Last BM:  2/29  Height:  Ht Readings from Last 1 Encounters:  03/24/18 5\' 4"  (1.626 m)    Weight:  Wt Readings from Last 10 Encounters:  03/24/18 56.7 kg  02/08/18 55.9 kg  01/04/18 58.8 kg  10/08/17 55.8 kg  07/27/17 57.3 kg  07/12/17 59.2 kg  07/06/17 63.3 kg  07/01/17 59.7 kg  06/16/17 63.2 kg  06/08/17 62.6 kg  Noted wt fluctuations, stable x 6 months 6.6 kg wt loss x 9 months = 10%  Per pt, UBW 59.1-61.2 kg  Ideal Body Weight:  54.5 kg  BMI:  Body mass index is 21.46 kg/m. normal  Estimated Nutritional Needs:  Calculated based on 2/27 wt (~57 kg)  Kcal:  1425-1710 (25-30 kcal/kg)  Protein:  68-86 gm (1.2-1.5 g/kg)  Fluid:  1 mL/kcal or per MD  Althea Grimmer, MS, RDN, LDN Pager: [phone] Available Mondays and Fridays, 9am-2pm

## 2018-03-28 NOTE — Discharge Summary (Signed)
Name: [name] Bell 73 y.o. PCP: Mosetta Anis, MD  Date of Admission: 03/24/2018  1:59 AM Date of Discharge: 03/28/2018 Attending Physician: Axel Filler, *  Discharge Diagnosis: 1.  Left femur fracture 2.  Mechanical fall 3. Mild COPD exacerbation 4.  Malnutrition 5.  Vaginal and rectal discharge   Discharge Medications: Allergies as of 03/28/2018      Reactions   Strawberry (diagnostic) Rash   Ibuprofen Other (See Comments)   REACTION: Bleeding ulcers   Nsaids Other (See Comments)   REACTION: Bleeding Ulcer   Latex Itching      Medication List    STOP taking these medications   HYDROcodone-acetaminophen 7.5-325 MG tablet Commonly known as:  NORCO Replaced by:  HYDROcodone-acetaminophen 5-325 MG tablet   predniSONE 20 MG tablet Commonly known as:  DELTASONE     TAKE these medications   albuterol (2.5 MG/3ML) 0.083% nebulizer solution Commonly known as:  PROVENTIL Take 3 mLs (2.5 mg total) by nebulization every 6 (six) hours as needed for wheezing or shortness of breath. What changed:  Another medication with the same name was changed. Make sure you understand how and when to take each.   PROAIR HFA 108 (90 Base) MCG/ACT inhaler Generic drug:  albuterol TAKE 2 PUFFS BY MOUTH EVERY 6 HOURS AS NEEDED FOR WHEEZE OR SHORTNESS OF BREATH What changed:  See the new instructions.   alendronate 70 MG tablet Commonly known as:  FOSAMAX Take 1 tablet (70 mg total) by mouth once a week. Take with a full glass of water on an empty stomach.   diphenhydrAMINE 25 MG tablet Commonly known as:  BENADRYL Take 25 mg by mouth as needed for allergies.   docusate sodium 100 MG capsule Commonly known as:  COLACE Take 1 capsule (100 mg total) by mouth 2 (two) times daily. To prevent constipation while taking pain medication. What changed:    how much to take  when to take this  reasons to take this  additional instructions     enoxaparin 40 MG/0.4ML injection Commonly known as:  LOVENOX Inject 0.4 mLs (40 mg total) into the skin daily for 30 doses. For 30 days post op for DVT prophylaxis   esomeprazole 40 MG capsule Commonly known as:  NEXIUM TAKE 1 CAPSULE (40 MG TOTAL) BY MOUTH 2 (TWO) TIMES DAILY BEFORE A MEAL.   ferrous gluconate 324 MG tablet Commonly known as:  FERGON Take 1 tablet (324 mg total) by mouth daily with breakfast.   fluticasone 50 MCG/ACT nasal spray Commonly known as:  FLONASE Place 2 sprays into both nostrils daily.   HYDROcodone-acetaminophen 5-325 MG tablet Commonly known as:  NORCO Take 1-2 tablets by mouth every 6 (six) hours as needed for moderate pain. Replaces:  HYDROcodone-acetaminophen 7.5-325 MG tablet   ipratropium-albuterol 0.5-2.5 (3) MG/3ML Soln Commonly known as:  DUONEB Take 3 mLs by nebulization every 6 (six) hours for 14 days.   metoprolol tartrate 25 MG tablet Commonly known as:  LOPRESSOR Take 12.5 mg by mouth 2 (two) times daily.   morphine 15 MG 12 hr tablet Commonly known as:  MS CONTIN Take 2 tablets (30 mg total) by mouth every 12 (twelve) hours as needed for pain.   ondansetron 4 MG tablet Commonly known as:  ZOFRAN TAKE 1 TABLET BY MOUTH EVERY 8 HOURS AS NEEDED FOR NAUSEA AND VOMITING What changed:  See the new instructions.   pravastatin 40 MG tablet Commonly known as:  PRAVACHOL Take 1 tablet (40 mg total) by mouth daily.   Vitamin D2 50 MCG (2000 UT) Tabs Take 2,000 Units by mouth daily.   witch hazel-glycerin pad Commonly known as:  TUCKS Apply topically as needed for itching.       Disposition and follow-up:   [name] Bell was discharged from [facility] in Stable condition.  At the hospital follow up visit please address:  1.  COPD- during the hospital course patient had a new oxygen requirement 2 to 3 L.  He was discharged to continue 2 L and continue DuoNeb treatments as needed please reassess supplemental  oxygen requirement and breathing treatments  2.  Labs / imaging needed at time of follow-up: None  3.  Pending labs/ test needing follow-up: None  Follow-up Appointments: Follow-up Information    Renette Butters, MD In 2 weeks.   Specialty:  Orthopedic Surgery Contact information: [address] [place] [address] [phone]           Hospital Course by problem list: 1.  Left femur fracture -patient presented after mechanical fall and was found to have a distal left femur fracture.  Orthopedic surgery performed an ORIF on 2/27.  She tolerated the procedure well and pain was well controlled on pain medications.  She had good lower extremity pulses and sensation was intact.  Orthopedics plans on following up with the patient in approximately 2 weeks.  Discharged to continue her home pain medications. 2.  Mechanical fall- patient uses a walker loosely outside of the home.  She is legally blind and has osteoporosis.  PT recommended SNF level rehab at discharge to improve strength and mobility. 3.  Mild COPD exacerbation-patient reported increased chest pressure that improved somewhat with albuterol and her pain medications.  Repeat chest x-ray showed new right perihilar patchy airspace opacity suspicious for pneumonia versus asymmetric edema.  At this time there are no signs or symptoms of infection.  She remains afebrile.  Although she has a new supplemental oxygen requirement of approximately 2 L.  When attempted to wean patient maintain oxygen saturations around 91%.  However her saturations did drop into the high 80s while sleeping.  This time she was discharged to continue 2 L supplemental oxygen and wean as tolerated. Can consider adding antibiotics if she develops signs or symptoms of infection.  Discharged to continue DuoNeb breathing treatments and her inhalers.  Recommend that she continue DuoNeb breathing treatments until her breathing improves and then she  can resume her albuterol nebulizer solution. 4.  Malnutrition- Moderate Malnutrition related to chronic illness as evidenced by energy intake < 75% for > or equal to 3 months, moderate fat depletion, moderate muscle depletion 5.  Vaginal and rectal discharge-patient reported a 1 day history of vaginal and rectal burning increased discharge.  She also noticed worsening lower abdominal cramping.  She reports in the past these were symptoms preceding an abdominal infection.  However, her CT abdomen showed no evidence of colitis.  UA showed no signs of infection and wet prep was unremarkable.  Discharge Vitals:   BP (!) 113/51 (BP Location: Left Arm)   Pulse (!) 101   Temp 98.4 F (36.9 C) (Oral)   Resp 16   Ht 5\' 4"  (1.626 m)   Wt 56.7 kg   LMP 08/25/1980   SpO2 99%   BMI 21.46 kg/m   Pertinent Labs, Studies, and Procedures:   CBC Latest Ref Rng & Units 03/26/2018 03/25/2018  03/24/2018  WBC 4.0 - 10.5 K/uL 10.4 11.0(H) 10.5  Hemoglobin 12.0 - 15.0 g/dL 12.7 13.1 13.2  Hematocrit 36.0 - 46.0 % 39.1 39.1 41.8  Platelets 150 - 400 K/uL 233 210 211   BMP Latest Ref Rng & Units 03/26/2018 03/25/2018 03/24/2018  Glucose 70 - 99 mg/dL 89 117(H) 105(H)  BUN 8 - 23 mg/dL 6(L) 5(L) 6(L)  Creatinine 0.44 - 1.00 mg/dL 0.68 0.72 0.94  BUN/Creat Ratio 12 - 28 - - -  Sodium 135 - 145 mmol/L 139 135 135  Potassium 3.5 - 5.1 mmol/L 4.1 4.2 3.6  Chloride 98 - 111 mmol/L 102 102 100  CO2 22 - 32 mmol/L 29 23 25   Calcium 8.9 - 10.3 mg/dL 7.7(L) 7.7(L) 8.1(L)   Dg Chest 2 View  Result Date: 03/24/2018 CLINICAL DATA:  Mechanical fall 2 hours ago. Trip and fall injury. EXAM: CHEST - 2 VIEW COMPARISON:  07/02/2017 FINDINGS: Heart size and pulmonary vascularity are normal. Emphysematous changes and scattered fibrosis in the lungs. No blunting of costophrenic angles. No pneumothorax. Mediastinal contours appear intact. Calcification of the aorta. IMPRESSION: Emphysematous changes and fibrosis in the lungs. No  evidence of active pulmonary disease. Electronically Signed   By: Lucienne Capers M.D.   On: 03/24/2018 03:58   Dg Pelvis 1-2 Views  Result Date: 03/24/2018 CLINICAL DATA:  73 year old female with fall and left lower extremity pain. EXAM: LEFT KNEE - COMPLETE 4+ VIEW; PELVIS - 1-2 VIEW; LEFT FEMUR 2 VIEWS COMPARISON:  CT of the abdomen pelvis dated 07/02/2017 FINDINGS: There is a linear longitudinal lucency in the distal femur involving the inter articular surface of the femoral intercondylar notch likely representing a fracture of the femur and less likely fracture involving the patella. There is a small suprapatellar joint effusion or possibly lipohemarthrosis. No other acute fracture identified. There is no dislocation. The bones are osteopenic. The soft tissues are unremarkable. IMPRESSION: Nondisplaced intra-articular fracture of the distal femur. Electronically Signed   By: Anner Crete M.D.   On: 03/24/2018 04:08   Dg Wrist Complete Left  Result Date: 03/24/2018 CLINICAL DATA:  73 year old female with fall and left wrist pain. EXAM: LEFT WRIST - COMPLETE 3+ VIEW; LEFT HAND - COMPLETE 3+ VIEW COMPARISON:  None. FINDINGS: There is no acute fracture or dislocation. The bones are osteopenic. No significant arthritic changes. The soft tissues are unremarkable. IMPRESSION: 1. No acute fracture or dislocation. 2. Osteopenia. Electronically Signed   By: Anner Crete M.D.   On: 03/24/2018 03:55   Ct Head Wo Contrast  Result Date: 03/24/2018 CLINICAL DATA:  Trip and fall injury. Skin tear to the forehead. EXAM: CT HEAD WITHOUT CONTRAST CT CERVICAL SPINE WITHOUT CONTRAST TECHNIQUE: Multidetector CT imaging of the head and cervical spine was performed following the standard protocol without intravenous contrast. Multiplanar CT image reconstructions of the cervical spine were also generated. COMPARISON:  MRI brain 11/21/2014. Cervical spine radiographs 08/14/2016 FINDINGS: CT HEAD FINDINGS Brain: No  evidence of acute infarction, hemorrhage, hydrocephalus, extra-axial collection or mass lesion/mass effect. Diffuse cerebral atrophy. Low-attenuation changes in the deep white matter consistent with small vessel ischemia. Vascular: No hyperdense vessel or unexpected calcification. Skull: Calvarium appears intact. Sinuses/Orbits: Paranasal sinuses and mastoid air cells are clear. Other: None. CT CERVICAL SPINE FINDINGS Alignment: Normal alignment. Skull base and vertebrae: Skull base appears intact. No vertebral compression deformities. No focal bone lesion or bone destruction. Soft tissues and spinal canal: No prevertebral soft tissue swelling. No abnormal paraspinal soft tissue mass  or infiltration. Disc levels: Degenerative changes with mild endplate hypertrophic changes at C4-5. Upper chest: Scarring in the lung apices. Vascular calcifications. Other: None. IMPRESSION: 1. No acute intracranial abnormalities. Chronic atrophy and small vessel ischemic changes. 2. Normal alignment of the cervical spine. No acute displaced fractures identified. Electronically Signed   By: Lucienne Capers M.D.   On: 03/24/2018 03:21   Ct Cervical Spine Wo Contrast  Result Date: 03/24/2018 CLINICAL DATA:  Trip and fall injury. Skin tear to the forehead. EXAM: CT HEAD WITHOUT CONTRAST CT CERVICAL SPINE WITHOUT CONTRAST TECHNIQUE: Multidetector CT imaging of the head and cervical spine was performed following the standard protocol without intravenous contrast. Multiplanar CT image reconstructions of the cervical spine were also generated. COMPARISON:  MRI brain 11/21/2014. Cervical spine radiographs 08/14/2016 FINDINGS: CT HEAD FINDINGS Brain: No evidence of acute infarction, hemorrhage, hydrocephalus, extra-axial collection or mass lesion/mass effect. Diffuse cerebral atrophy. Low-attenuation changes in the deep white matter consistent with small vessel ischemia. Vascular: No hyperdense vessel or unexpected calcification. Skull:  Calvarium appears intact. Sinuses/Orbits: Paranasal sinuses and mastoid air cells are clear. Other: None. CT CERVICAL SPINE FINDINGS Alignment: Normal alignment. Skull base and vertebrae: Skull base appears intact. No vertebral compression deformities. No focal bone lesion or bone destruction. Soft tissues and spinal canal: No prevertebral soft tissue swelling. No abnormal paraspinal soft tissue mass or infiltration. Disc levels: Degenerative changes with mild endplate hypertrophic changes at C4-5. Upper chest: Scarring in the lung apices. Vascular calcifications. Other: None. IMPRESSION: 1. No acute intracranial abnormalities. Chronic atrophy and small vessel ischemic changes. 2. Normal alignment of the cervical spine. No acute displaced fractures identified. Electronically Signed   By: Lucienne Capers M.D.   On: 03/24/2018 03:21   Ct Knee Left Wo Contrast  Result Date: 03/24/2018 CLINICAL DATA:  73 year old female with fall and trauma to the left knee. EXAM: CT OF THE left KNEE WITHOUT CONTRAST TECHNIQUE: Multidetector CT imaging of the left knee was performed according to the standard protocol. Multiplanar CT image reconstructions were also generated. COMPARISON:  Left knee radiograph dated 03/24/2018 FINDINGS: Bones/Joint/Cartilage There is a nondisplaced fracture involving the inter articular surface of the distal femur at the femoral intercondylar notch with apparent extension to the lateral femoral condyle. There is advanced osteopenia. No dislocation. There is a moderate suprapatellar lipohemarthrosis. Ligaments Suboptimally assessed by CT. Muscles and Tendons No acute findings. Soft tissues Mild diffuse subcutaneous edema. IMPRESSION: 1. Nondisplaced intra-articular fracture of the distal femur. No dislocation. 2. Moderate suprapatellar lipohemarthrosis. Electronically Signed   By: Anner Crete M.D.   On: 03/24/2018 06:42   Dg Knee Complete 4 Views Left  Result Date: 03/24/2018 CLINICAL DATA:   74 year old female with fall and left lower extremity pain. EXAM: LEFT KNEE - COMPLETE 4+ VIEW; PELVIS - 1-2 VIEW; LEFT FEMUR 2 VIEWS COMPARISON:  CT of the abdomen pelvis dated 07/02/2017 FINDINGS: There is a linear longitudinal lucency in the distal femur involving the inter articular surface of the femoral intercondylar notch likely representing a fracture of the femur and less likely fracture involving the patella. There is a small suprapatellar joint effusion or possibly lipohemarthrosis. No other acute fracture identified. There is no dislocation. The bones are osteopenic. The soft tissues are unremarkable. IMPRESSION: Nondisplaced intra-articular fracture of the distal femur. Electronically Signed   By: Anner Crete M.D.   On: 03/24/2018 04:08   Dg Knee Left Port  Result Date: 03/24/2018 CLINICAL DATA:  Post Percutaneous Pinning of Left Distal Femur for Left  Distal Femur Fracture. EXAM: PORTABLE LEFT KNEE - 1-2 VIEW COMPARISON:  None. FINDINGS: Two fixation screws traverse the distal LEFT femur fracture site. Screws appear intact and stable in alignment compared to intraoperative fluoroscopic views from earlier same day. Osseous alignment appears stable. Expected postsurgical changes within the overlying soft tissues. IMPRESSION: Status post fixation of the distal LEFT femur fracture. Hardware appears intact and stable in alignment. No evidence of surgical complicating feature. Electronically Signed   By: Franki Cabot M.D.   On: 03/24/2018 14:04   Dg Hand Complete Left  Result Date: 03/24/2018 CLINICAL DATA:  73 year old female with fall and left wrist pain. EXAM: LEFT WRIST - COMPLETE 3+ VIEW; LEFT HAND - COMPLETE 3+ VIEW COMPARISON:  None. FINDINGS: There is no acute fracture or dislocation. The bones are osteopenic. No significant arthritic changes. The soft tissues are unremarkable. IMPRESSION: 1. No acute fracture or dislocation. 2. Osteopenia. Electronically Signed   By: Anner Crete  M.D.   On: 03/24/2018 03:55   Dg C-arm 1-60 Min  Result Date: 03/24/2018 CLINICAL DATA:  Distal femoral fracture EXAM: DG C-ARM 61-120 MIN; LEFT KNEE - 3 VIEW COMPARISON:  None. FLUOROSCOPY TIME:  Fluoroscopy Time:  35 seconds Radiation Exposure Index (if provided by the fluoroscopic device): Not available Number of Acquired Spot Images: 2 FINDINGS: Two spot films were obtained intraoperatively and reveal 2 fixation screws traversing the distal femur in the area of prior fracture. No acute abnormality is noted. IMPRESSION: Status post ORIF of distal left femoral fracture. Electronically Signed   By: Inez Catalina M.D.   On: 03/24/2018 13:54   Dg Femur Min 2 Views Left  Result Date: 03/24/2018 CLINICAL DATA:  73 year old female with fall and left lower extremity pain. EXAM: LEFT KNEE - COMPLETE 4+ VIEW; PELVIS - 1-2 VIEW; LEFT FEMUR 2 VIEWS COMPARISON:  CT of the abdomen pelvis dated 07/02/2017 FINDINGS: There is a linear longitudinal lucency in the distal femur involving the inter articular surface of the femoral intercondylar notch likely representing a fracture of the femur and less likely fracture involving the patella. There is a small suprapatellar joint effusion or possibly lipohemarthrosis. No other acute fracture identified. There is no dislocation. The bones are osteopenic. The soft tissues are unremarkable. IMPRESSION: Nondisplaced intra-articular fracture of the distal femur. Electronically Signed   By: Anner Crete M.D.   On: 03/24/2018 04:08   Dg Knee 2 Views Left  Result Date: 03/24/2018 CLINICAL DATA:  Distal femoral fracture EXAM: DG C-ARM 61-120 MIN; LEFT KNEE - 3 VIEW COMPARISON:  None. FLUOROSCOPY TIME:  Fluoroscopy Time:  35 seconds Radiation Exposure Index (if provided by the fluoroscopic device): Not available Number of Acquired Spot Images: 2 FINDINGS: Two spot films were obtained intraoperatively and reveal 2 fixation screws traversing the distal femur in the area of prior  fracture. No acute abnormality is noted. IMPRESSION: Status post ORIF of distal left femoral fracture. Electronically Signed   By: Inez Catalina M.D.   On: 03/24/2018 13:54   Discharge Instructions: Discharge Instructions    Diet - low sodium heart healthy   Complete by:  As directed    Discharge instructions   Complete by:  As directed    [name] Bell,  You were hospitalized due to your left leg fracture. I want you to continue Duoneb breathing treatments every six hours as needed to help your breathing. I also want you to continue using 2L supplemental oxygen. Continue all your other medications as prescribed.   Thank  you for allowing [place] to be a part of your care!   Increase activity slowly   Complete by:  As directed       Signed: Mike Craze, DO 03/28/2018, 1:47 PM   Pager: [phone]

## 2018-03-28 NOTE — Progress Notes (Signed)
Patient will DC to: [name] Anticipated DC date: 03/28/2018 Family notified:[facility] [id number]  Per MD patient ready for DC to [facility] . RN, patient, patient's family, and facility notified of DC. Discharge Summary sent to facility. RN given number for report [phone]. DC packet on chart. Ambulance transport requested for patient.  CSW signing off.  [place], [place]

## 2018-03-28 NOTE — Progress Notes (Signed)
Patient maintains oxygen saturation around 91% but has to be reminded to take deep breaths due to her pain medication.  The saturation will drop into the high 80"s while sleeping.

## 2018-03-28 NOTE — Progress Notes (Signed)
Respiratory at the bedside for treatment.  99% during the treatment.  Oxygen via [place] has been removed and will be monitoring how the patient doses on RA

## 2018-03-28 NOTE — Progress Notes (Signed)
EKG completed.  Portable chest xray in progress

## 2018-03-28 NOTE — Clinical Social Work Placement (Signed)
   CLINICAL SOCIAL WORK PLACEMENT  NOTE  Date:  03/28/2018  Patient Details  Name: [name] Bell MRN: [id number] Date of Birth: December 28, 1945  Clinical Social Work is seeking post-discharge placement for this patient at the [place] level of care (*CSW will initial, date and re-position this form in  chart as items are completed):  Yes   Patient/family provided with [facility] [facility] Work Department's list of facilities offering this level of care within the geographic area requested by the patient (or if unable, by the patient's family).  Yes   Patient/family informed of their freedom to choose among providers that offer the needed level of care, that participate in Medicare, Medicaid or managed care program needed by the patient, have an available bed and are willing to accept the patient.      Patient/family informed of [facility]'s ownership interest in [facility] and [facility], as well as of the fact that they are under no obligation to receive care at these facilities.  PASRR submitted to EDS on       PASRR number received on 03/25/18     Existing PASRR number confirmed on       FL2 transmitted to all facilities in geographic area requested by pt/family on 03/25/18     FL2 transmitted to all facilities within larger geographic area on       Patient informed that his/her managed care company has contracts with or will negotiate with certain facilities, including the following:        Yes   Patient/family informed of bed offers received.  Patient chooses bed at [facility] recommends and patient chooses bed at      Patient to be transferred to [facility] on 03/28/18.  Patient to be transferred to facility by [facility]     Patient family notified on 03/28/18 of transfer.  Name of family member notified:  [facility]     PHYSICIAN       Additional Comment:     _______________________________________________ Alberteen Sam, LCSW 03/28/2018, 2:33 PM

## 2018-03-28 NOTE — Progress Notes (Signed)
Report called to [place] at [facility].  Written discharge packet will be sent via [facility]

## 2018-03-28 NOTE — Progress Notes (Addendum)
   Subjective: [name] Bell reported feeling some chest pressure. She states this has been going on for the past few weeks. She feels some pain on the right side of her chest that started after her fall. She describes some orthopnea and needs to sleep sitting up at night. She denies any history of heart failure or disease. States the albuterol has helped this chest pressure some as well as the chest pain. All questions and concerns addressed.   Objective:  Vital signs in last 24 hours: Vitals:   03/27/18 2034 03/28/18 0410 03/28/18 0900 03/28/18 1211  BP: 125/67 (!) 116/51 (!) 113/51   Pulse: 86 99 (!) 101   Resp: 18 18 16    Temp: 98.3 F (36.8 C)  98.4 F (36.9 C)   TempSrc: Oral  Oral   SpO2: 99% 98% 99% 99%  Weight:      Height:       Gen: seen comfortably sitting in bed, no distress  Pulm: bilateral rhonchi and mild wheezing, on 3L, normal effort CV: no murmurs, RR Ext: no edema, left leg in binder  Skin: warm and dry  Assessment/Plan:  Principal Problem:   Femur fracture, left (HCC) Active Problems:   COPD (chronic obstructive pulmonary disease) (HCC)   Closed fracture of distal end of femur, unspecified fracture morphology, initial encounter ([place])   Fall   Pain   Malnutrition of moderate degree  [name] is a 73 year old female with a history of legally blind, ischemic colitis status post permanent colostomy, chronic pain secondary to chronic thoracic compression fraction, IDA, and COPD who presented after a mechanical fall at home. She had imaging that showed a distal left femur fracture. Orthopedics treated with an ORIF.  Left femur fracture: - S/p percutaneous pinning of distal femur on 2/27.She tolerated the procedure well, she is still having leg pain howeverthat the pain medicationshelp. She has good pulses, sensation intact, and warm extremities. -Continue home pain medications- Norco PRNand MS Contin twice daily as needed, gabapentin and tylenol    -Continue dilaudid 0.5-1 mg q4 hours for break through pain -Patient is medically stable for discharge to SNF today   Chest pressure  ? Mild COPD Exacerbation - Patient reported a couple week hx of chest pressure that improves some with albuterol and pain medications. She endorses some right sided chest pain that started after her fall, orthopnea and SOB. - Repeat CXR showed new right perihilar patch airspace opacity suspicious for pnueumonia vs asymmetric edema. At this time there is no s/sx of infection but can consider adding antibiotics if she develops fevers or leukocytosis  - Given duoneb breathing treatments and will continue this at discharge   Vaginal/rectal, leaking stool: -She did not report any episodes of leaking stool or discharge today -No s/sx of infection, will continue to monitor   Chronic pain with long-term opiate use -Continue Norco7.5-325 mg q8h prn and MS contin 15 mg bid prnat home -ContinueDilaudid 1 mg every 3 hours as needed for severe pain   Dispo: Anticipated discharge pending SNF placement.  Caera Enwright N, DO 03/28/2018, 1:48 PM Pager: [phone]

## 2018-03-29 ENCOUNTER — Encounter: Payer: Self-pay | Admitting: Internal Medicine

## 2018-03-29 ENCOUNTER — Other Ambulatory Visit: Payer: Self-pay | Admitting: Internal Medicine

## 2018-03-29 ENCOUNTER — Non-Acute Institutional Stay (SKILLED_NURSING_FACILITY): Payer: Medicare Other | Admitting: Internal Medicine

## 2018-03-29 DIAGNOSIS — F329 Major depressive disorder, single episode, unspecified: Secondary | ICD-10-CM | POA: Diagnosis not present

## 2018-03-29 DIAGNOSIS — J441 Chronic obstructive pulmonary disease with (acute) exacerbation: Secondary | ICD-10-CM

## 2018-03-29 DIAGNOSIS — R9389 Abnormal findings on diagnostic imaging of other specified body structures: Secondary | ICD-10-CM

## 2018-03-29 DIAGNOSIS — S72409A Unspecified fracture of lower end of unspecified femur, initial encounter for closed fracture: Secondary | ICD-10-CM | POA: Diagnosis not present

## 2018-03-29 DIAGNOSIS — Z9189 Other specified personal risk factors, not elsewhere classified: Secondary | ICD-10-CM

## 2018-03-29 DIAGNOSIS — Z79891 Long term (current) use of opiate analgesic: Secondary | ICD-10-CM

## 2018-03-29 HISTORY — DX: Abnormal findings on diagnostic imaging of other specified body structures: R93.89

## 2018-03-29 MED ORDER — HYDROCODONE-ACETAMINOPHEN 5-325 MG PO TABS: 1.0000 | ORAL_TABLET | Freq: Four times a day (QID) | ORAL | 0 refills | Status: DC | PRN

## 2018-03-29 MED ORDER — MORPHINE SULFATE ER 15 MG PO TBCR: 30.0000 mg | EXTENDED_RELEASE_TABLET | Freq: Two times a day (BID) | ORAL | 0 refills | Status: DC | PRN

## 2018-03-29 NOTE — Assessment & Plan Note (Addendum)
Duloxetine 30 mg initiated; this may benefit her chronic pain syndrome as we PCP can titrate dose as clinically indicated

## 2018-03-29 NOTE — Progress Notes (Signed)
Prescriptions for narcotics sent to [facility]

## 2018-03-29 NOTE — Assessment & Plan Note (Signed)
Short course of prednisone and broad-spectrum antibiotic (Augmentin)

## 2018-03-29 NOTE — Assessment & Plan Note (Addendum)
Trial of duloxetine 30 mg daily with narcotic wean as possible

## 2018-03-29 NOTE — Progress Notes (Signed)
NURSING HOME LOCATION:  [name] ROOM NUMBER:  220/A  CODE STATUS:  Full Code  PCP: Gilberto Better MD.   This is a comprehensive admission note to [facility] performed on this date less than 30 days from date of admission. Included are preadmission medical/surgical history; reconciled medication list; family history; social history and comprehensive review of systems.  Corrections and additions to the records were documented. Comprehensive physical exam was also performed. Additionally a clinical summary was entered for each active diagnosis pertinent to this admission in the Problem List to enhance continuity of care.  HPI: The patient was hospitalized 2/27-03/28/2018 for left femur fracture sustained in a mechanical fall.  She states that she tripped over the wheel of her walker and fell on the rug.  She sustained rug burns to her forehead, wrist and left knee.  She went back to bed but upon arising could not mobilize or weight-bear on the left leg because of pain.  Patient does have a history of osteoporosis with chronic pain from thoracic vertebral fractures.  ORIF completed 2/27 by Dr. Fredonia Highland.  The patient is legally blind and lives by herself with the help of aides for 3 hours each day. Also has COPD and is oxygen dependent.  Oxygen need did increase while hospitalized as O2 sats decreased into the 80s while asleep.  X-ray suggested a possible right perihilar airspace infiltrate but the patient had no symptoms of pneumonia or active infection.  White count was normal with minimal left shift.  Monitor for possible infection was recommended. She does state that she was told that her sputum was green while in hospital.  Aggressive pulmonary toilet was continued.  She does have a history of 1.5 packs/day for 10 years and 1/2 pack/day for 49 years for a total pack year history of actually 40 pack years of smoking.  Past medical and surgical history:  history of bleeding ulcer  related to NSAID use, hepatic steatosis, chronic depression, history of colitis, history of perforated bowel, protein/caloric malnutrition and history of dermatologic malignancy for which she has had Mohs surgery.  Other surgeries include colostomy.  Social history: Nondrinker  Family history: Reviewed   Review of systems: She has intermittent vaginal & rectal discharge which has been a prodrome for intra-abdominal infection.  This did occur the day prior to admission but CT revealed no evidence of colitis.  Wet prep was unremarkable and urinalysis did not suggest infection. She states that she was taking hydrocodone every 8 hours @ home " to prevent panic and depression".  She describes nausea today.  She also describes anxiety. She previously had been on Wellbutrin but this was discontinued during a past hospitalization.  She has never been on duloxetine. Abdominal pain is partially related to a "hernia behind the colostomy".   She describes dry eyes.  She is legally blind but states that she can see some colors, especially blue.  Constitutional: No fever, significant weight change Eyes: No redness, discharge, pain ENT/mouth: No nasal congestion, purulent discharge, earache, change in hearing, sore throat  Cardiovascular: No chest pain, palpitations, paroxysmal nocturnal dyspnea, claudication, edema  Respiratory: No reported hemoptysis, DOE, significant snoring, apnea  Gastrointestinal: No heartburn, dysphagia, nausea /vomiting, rectal bleeding, melena Genitourinary: No dysuria, hematuria, pyuria, incontinence, nocturia Musculoskeletal: No joint stiffness, joint swelling Dermatologic: No reported rash, pruritus, change in appearance of skin Neurologic: No dizziness, headache, syncope, seizures, numbness, tingling Endocrine: No change in hair/skin/nails, excessive thirst, excessive hunger, excessive urination  Hematologic/lymphatic:  No significant bruising, lymphadenopathy, abnormal  bleeding Allergy/immunology: No itchy/watery eyes, significant sneezing, urticaria, angioedema  Pertinent or positive findings: O2 sats were 86-88% on room air.  She was visibly dyspneic at rest while conversing. She appears chronically ill.  Weathering of the facies present. Her voice is weak.  There is marked pallor to the skin.  She has resolving ecchymosis and abrasions over the left forehead.  She cannot count fingers with either eye.  She is edentulous but only wearing the upper plate.  First and second heart sounds are accentuated.  She has low-grade rhonchi and expiratory wheezing bilaterally, more so on the right than the left.Slight tenderness to pa;lpation LLQ.  Dorsalis pedis pulses are decreased.  She has sclerotic, irregular, hyperpigmented skin over the right shin with some exfoliation.  She has similar changes of the dorsum of the left foot.  The left leg is in a brace.  Toenails are markedly elongated and thickened with some deviations.  General appearance:  no acute distress Lymphatic: No lymphadenopathy about the head, neck, axilla. Eyes: No conjunctival inflammation or lid edema is present. There is no scleral icterus. Ears:  External ear exam shows no significant lesions or deformities.   Nose:  External nasal examination shows no deformity or inflammation. Nasal mucosa are pink and moist without lesions, exudates Oral exam: Lips and gums are healthy appearing.There is no oropharyngeal erythema or exudate. Neck:  No thyromegaly, masses, tenderness noted.    Heart:  No gallop, murmur, click, rub.  Abdomen: Bowel sounds are normal.  Abdomen is soft  with no organomegaly, hernias, masses. GU: Deferred  Extremities:  No cyanosis, clubbing, edema. Neurologic exam: Balance, Rhomberg, finger to nose testing could not be completed due to clinical state Skin: Warm & dry w/o tenting.  See clinical summary under each active problem in the Problem List with associated updated therapeutic  plan

## 2018-03-29 NOTE — Assessment & Plan Note (Addendum)
Nebulizer treatments and incentive spirometry will be continued at the SNF Supplemental oxygen for O2 sats less than 89%

## 2018-03-29 NOTE — Patient Instructions (Signed)
See assessment and plan under each diagnosis in the problem list and acutely for this visit 

## 2018-03-29 NOTE — Assessment & Plan Note (Signed)
Orthopedic  follow-up as scheduled PT/OT at [facility]

## 2018-04-01 ENCOUNTER — Ambulatory Visit (INDEPENDENT_AMBULATORY_CARE_PROVIDER_SITE_OTHER): Payer: Medicare Other | Admitting: Podiatry

## 2018-04-01 ENCOUNTER — Encounter: Payer: Self-pay | Admitting: Podiatry

## 2018-04-01 VITALS — BP 117/63 | HR 84

## 2018-04-01 DIAGNOSIS — B351 Tinea unguium: Secondary | ICD-10-CM

## 2018-04-01 DIAGNOSIS — M79675 Pain in left toe(s): Secondary | ICD-10-CM

## 2018-04-01 DIAGNOSIS — M79674 Pain in right toe(s): Secondary | ICD-10-CM | POA: Diagnosis not present

## 2018-04-01 NOTE — Progress Notes (Signed)
Complaint:  Visit Type: Patient presents  to my office for  preventative foot care services. Complaint: Patient states" my nails have grown long and thick and become painful to walk and wear shoes"  The patient presents for preventative foot care services. Patient is brought to the office in a wheelchair due to fracture left femur.  She is accompanied by a caregiver.  Podiatric Exam: Vascular: dorsalis pedis and posterior tibial pulses are weakly  palpable bilateral. Capillary return is immediate. Cold feet noted.. Skin turgor WNL  Sensorium: Normal Semmes Weinstein monofilament test. Normal tactile sensation bilaterally. Nail Exam: Pt has thick disfigured discolored nails with subungual debris noted bilateral entire nail hallux through fifth toenails Ulcer Exam: There is no evidence of ulcer or pre-ulcerative changes or infection. Orthopedic Exam: Muscle tone and strength are WNL. No limitations in general ROM. No crepitus or effusions noted. Foot type and digits show no abnormalities. Bony prominences are unremarkable. Skin: No Porokeratosis. No infection or ulcers.  Dry scaly skin dorsum of both feet.  Diagnosis:  Onychomycosis, , Pain in right toe, pain in left toes  Treatment & Plan Procedures and Treatment: Consent by patient was obtained for treatment procedures.   Debridement of mycotic and hypertrophic toenails, 1 through 5 bilateral and clearing of subungual debris. No ulceration, no infection noted.  Return Visit-Office Procedure: Patient instructed to return to the office for a follow up visit 3 months for continued evaluation and treatment.    Gardiner Barefoot DPM

## 2018-04-11 NOTE — Progress Notes (Signed)
This encounter was created in error - please disregard.

## 2018-04-12 ENCOUNTER — Non-Acute Institutional Stay (SKILLED_NURSING_FACILITY): Payer: Medicare Other | Admitting: Internal Medicine

## 2018-04-12 ENCOUNTER — Encounter: Payer: Medicare Other | Admitting: Internal Medicine

## 2018-04-12 ENCOUNTER — Encounter: Payer: Self-pay | Admitting: Internal Medicine

## 2018-04-12 DIAGNOSIS — G894 Chronic pain syndrome: Secondary | ICD-10-CM

## 2018-04-12 DIAGNOSIS — R091 Pleurisy: Secondary | ICD-10-CM

## 2018-04-12 DIAGNOSIS — F329 Major depressive disorder, single episode, unspecified: Secondary | ICD-10-CM

## 2018-04-12 DIAGNOSIS — R9389 Abnormal findings on diagnostic imaging of other specified body structures: Secondary | ICD-10-CM

## 2018-04-12 NOTE — Progress Notes (Signed)
NURSING HOME LOCATION:  [name] ROOM NUMBER:  220-A  CODE STATUS:  Full Code  PCP:  Mosetta Anis, MD  [address] [place] [address]  This is a nursing facility follow up for specific acute issue of  abnormal CXray.  Interim medical record and care since last [place] visit was updated with review of diagnostic studies and change in clinical status since last visit were documented.  HPI: The patient received a course of Augmentin, steroids, and pulmonary toilet for a right upper lobe infiltrate suggested on portable chest x-ray 3/2 @ [facility]. SNF mobile imaging  3/13 chest x-ray showed that the right upper lobe infiltrate had essentially resolved, but there was suggestion of an oval tissue density with possible air lucency inclusion in the right lower lobe in the 7th-eighth intercostal space.  There was also costophrenic angle blunting greater on the left than the right.  There was somewhat of a scalloping pattern to the left hemidiaphragm and  effusion could not be ruled out.   She was also describing purulent sputum.  She is visually impaired but stated that it was "green-purple".  Additionally she was describing inspiratory pain in the right lateral chest.  Levaquin was initiated and she was seen in follow-up today. She was actually to have seen her PCP Dr. Truman Hayward today at 1:45 but this was cancelled because of the corona virus threat.  She states that her breathing is better despite the sputum as described.  She cannot define any hemoptysis; but there was dried reddish-brown secretions noted on a tissue paper.  She has been compliant with her incentive spirometer which was initiated with the Levaquin.  Review of systems: States that she has profound weakness and nausea.  She has pain as noted in the right lateral chest with inspiration as well pain @ the site of the colostomy and alsothe left lower extremity.  She also describes depression present for several years.   She states she was previously on Wellbutrin.  She also describes occasional tachycardia.  Constitutional: No fever, significant weight change Eyes: No redness, discharge, pain, vision change ENT/mouth: No nasal congestion,  purulent discharge, earache, change in hearing, sore throat  Cardiovascular: No chest pain, paroxysmal nocturnal dyspnea, claudication, edema  Gastrointestinal: No heartburn, dysphagia, abdominal pain, vomiting, rectal bleeding, melena, change in bowels Genitourinary: No dysuria, hematuria, pyuria, incontinence, nocturia Dermatologic: No rash, pruritus, change in appearance of skin Neurologic: No dizziness, headache, syncope, seizures, numbness, tingling Endocrine: No change in hair/skin/nails, excessive thirst, excessive hunger, excessive urination  Hematologic/lymphatic: No significant bruising, lymphadenopathy, abnormal bleeding Allergy/immunology: No itchy/watery eyes, significant sneezing, urticaria, angioedema  Physical exam:  Pertinent or positive findings: She is thin and appears chronically ill.  She exhibits a blank stare of someone who is profoundly visually impaired.  She is only wearing the upper plate.  An inspiratory rub is noted in the right axillary line,otherwise breath sounds are markedly decreased.  Bradycardia is present.  Pedal pulses are decreased.  There is thickened hyperpigmented skin over the right shin.  The left shin is wrapped and she is also wearing a brace on that extremity.    General appearance: no acute distress,no increased work of breathing is present.   Lymphatic: No lymphadenopathy about the head, neck, axilla. Eyes: No conjunctival inflammation or lid edema is present. There is no scleral icterus. Ears:  External ear exam shows no significant lesions or deformities.   Nose:  External nasal examination shows no deformity or inflammation.  Nasal mucosa are pink and moist without lesions, exudates Oral exam:  Lips and gums are healthy  appearing. There is no oropharyngeal erythema or exudate. Neck:  No thyromegaly, masses, tenderness noted.    Heart:  No gallop, murmur, click, rub .  Lungs: without wheezes, rhonchi, rales. Abdomen: Bowel sounds are normal. Abdomen is soft and nontender with no organomegaly, hernias, masses. GU: Deferred  Extremities:  No cyanosis, clubbing, edema  Neurologic exam : Skin: Warm & dry w/o tenting. No significant rash.  See summary under each active problem in the Problem List with associated updated therapeutic plan

## 2018-04-12 NOTE — Patient Instructions (Signed)
See assessment and plan under each diagnosis in the problem list and acutely for this visit 

## 2018-04-12 NOTE — Assessment & Plan Note (Signed)
Titrate duloxetine to 60 mg daily

## 2018-04-12 NOTE — Assessment & Plan Note (Signed)
Titrate duloxetine Orthopedic follow-up of left lower extremity as per Dr. Percell Miller Respiratory depression risk of opiates discussed

## 2018-04-13 ENCOUNTER — Encounter: Payer: Self-pay | Admitting: Internal Medicine

## 2018-04-14 ENCOUNTER — Encounter: Payer: Self-pay | Admitting: Internal Medicine

## 2018-04-14 ENCOUNTER — Non-Acute Institutional Stay (SKILLED_NURSING_FACILITY): Payer: Medicare Other | Admitting: Internal Medicine

## 2018-04-14 DIAGNOSIS — R9389 Abnormal findings on diagnostic imaging of other specified body structures: Secondary | ICD-10-CM

## 2018-04-14 DIAGNOSIS — Z9189 Other specified personal risk factors, not elsewhere classified: Secondary | ICD-10-CM | POA: Diagnosis not present

## 2018-04-14 DIAGNOSIS — J90 Pleural effusion, not elsewhere classified: Secondary | ICD-10-CM | POA: Diagnosis not present

## 2018-04-14 NOTE — Progress Notes (Signed)
NURSING HOME LOCATION:  [name] ROOM NUMBER:220 CODE STATUS: Full Code   PCP: Gilberto Better MD   This is a nursing facility follow up for specific acute issue of possible hemoptysis in the context of elevated d-dimer.  Interim medical record and care since last [place] visit was updated with review of diagnostic studies and change in clinical status since last visit were documented.  HPI: She completed a course of Augmentin and a burst of steroids along with scheduled nebulizer treatments for purulent sputum &  RUL infiltrate on [facility] portable film.  Clinically she seemed to be improved; but on portable imaging @ the SNF there was a new right lower lobe oval infiltrate as well as a suggestion of possible infiltrate in the left lower lobe.  There was also increased atelectasis in the right lower lobe.  CBC and differential revealed no anemia and normal white count without left shift.  She showed [place] tissue with expectorated sputum which appeared to suggest dried blood.  On exam she was found to have a rub in the right axillary area.   She has been on Lovenox as DVT prophylaxis following the femur fracture.   AHT was reviewed: She has been getting the Lovenox once a day except for a missed dose 3/16. D-dimer was performed because of a Wells criteria score of at least 5.5.  This increased score was based on suggestive symptoms of both chest pain and left lower extremity pain; possible hemoptysis; pleural rub; and immobilization. D-dimer was 2320 with normals of 190-500.  This was performed through [facility] in [place].  Review of systems: She told the NP student that she been having frontal headaches as well as pain in the neck.  The latter is described as a "crick".  She states she continues to cough up "red-black sputum".  She continues to have diffuse pain in the left leg without localization to the calf or thigh.  Constitutional: No fever, significant  weight change  Eyes: No redness, discharge, pain, vision change ENT/mouth: No nasal congestion,  purulent discharge, earache, change in hearing, sore throat  Cardiovascular: No palpitations, paroxysmal nocturnal dyspnea, claudication, edema  Gastrointestinal: No heartburn, dysphagia, abdominal pain, nausea /vomiting, rectal bleeding, melena, change in bowels Genitourinary: No dysuria, hematuria, pyuria, incontinence, nocturia Dermatologic: No rash, pruritus, change in appearance of skin Neurologic: No dizziness,  syncope, seizures, numbness, tingling Psychiatric: No significant anxiety, depression, insomnia, anorexia Endocrine: No change in hair/skin/nails, excessive thirst, excessive hunger, excessive urination  Hematologic/lymphatic: No significant bruising, lymphadenopathy Allergy/immunology: No itchy/watery eyes, significant sneezing, urticaria, angioedema  Physical exam:  Pertinent or positive findings: She appears somewhat chronically ill and suboptimally nourished.  She has a blank stare of the visually impaired.  She is wearing only the upper plate.  Heart sounds are slow and regular.  Breath sounds are decreased except for a distinct, harsh rub in the right axillary line area.  Left lower extremity is in a brace and is also wrapped.  Pedal pulses are decreased.  Slight clubbing of the nailbeds is suggested.  She has a small eschar @ the left anterior hairline.  General appearance:  no acute distress, increased work of breathing is present.   Lymphatic: No lymphadenopathy about the head, neck, axilla. Eyes: No conjunctival inflammation or lid edema is present. There is no scleral icterus. Ears:  External ear exam shows no significant lesions or deformities.   Nose:  External nasal examination shows no deformity or inflammation. Nasal mucosa  are pink and moist without lesions, exudates Oral exam:  Lips and gums are healthy appearing. There is no oropharyngeal erythema or exudate. Neck:   No thyromegaly, masses, tenderness noted.    Heart:  No gallop, murmur, click, rub .  Abdomen: Bowel sounds are normal. Abdomen is soft and nontender with no organomegaly, hernias, masses. GU: Deferred  Extremities:  No cyanosis, clubbing, edema  Skin: Warm & dry w/o tenting. No significant lesions or rash.  See summary under each active problem in the Problem List with associated updated therapeutic plan

## 2018-04-14 NOTE — Assessment & Plan Note (Addendum)
Because of pleuritic chest pain, pleural rub, hemoptysis, and Wells Criteria score of 5.5;Lovenox once daily changed to Eliquis to cover possible PTE

## 2018-04-16 ENCOUNTER — Encounter: Payer: Self-pay | Admitting: Internal Medicine

## 2018-04-16 DIAGNOSIS — J90 Pleural effusion, not elsewhere classified: Secondary | ICD-10-CM

## 2018-04-16 HISTORY — DX: Pleural effusion, not elsewhere classified: J90

## 2018-04-16 NOTE — Assessment & Plan Note (Signed)
Because of Corona Virus restrictions, she could not keep F/U appt with Dr Truman Hayward. Present narcotic dosages will be continued but not increased because of high risk of respiratory suppression. This has been discussed with her on @ least 3 occasions.

## 2018-04-16 NOTE — Assessment & Plan Note (Signed)
Lovenox changed to Eliquis

## 2018-04-16 NOTE — Patient Instructions (Signed)
See assessment and plan under each diagnosis in the problem list and acutely for this visit 

## 2018-04-21 ENCOUNTER — Other Ambulatory Visit: Payer: Self-pay | Admitting: Internal Medicine

## 2018-04-21 MED ORDER — HYDROCODONE-ACETAMINOPHEN 5-325 MG PO TABS: 1.0000 | ORAL_TABLET | Freq: Four times a day (QID) | ORAL | 0 refills | Status: DC | PRN

## 2018-04-21 MED ORDER — HYDROCODONE-ACETAMINOPHEN 5-325 MG PO TABS: 1.0000 | ORAL_TABLET | Freq: Four times a day (QID) | ORAL | 0 refills | Status: AC | PRN

## 2018-04-28 ENCOUNTER — Other Ambulatory Visit: Payer: Self-pay | Admitting: Internal Medicine

## 2018-04-28 DIAGNOSIS — K219 Gastro-esophageal reflux disease without esophagitis: Secondary | ICD-10-CM

## 2018-05-04 ENCOUNTER — Other Ambulatory Visit: Payer: Self-pay | Admitting: Adult Health

## 2018-05-04 ENCOUNTER — Non-Acute Institutional Stay (SKILLED_NURSING_FACILITY): Payer: Medicare Other | Admitting: Adult Health

## 2018-05-04 ENCOUNTER — Encounter: Payer: Self-pay | Admitting: Adult Health

## 2018-05-04 DIAGNOSIS — S7292XS Unspecified fracture of left femur, sequela: Secondary | ICD-10-CM

## 2018-05-04 MED ORDER — MORPHINE SULFATE ER 30 MG PO TBCR: 30.0000 mg | EXTENDED_RELEASE_TABLET | Freq: Two times a day (BID) | ORAL | 0 refills | Status: DC | PRN

## 2018-05-04 MED ORDER — HYDROCODONE-ACETAMINOPHEN 5-325 MG PO TABS: 1.0000 | ORAL_TABLET | Freq: Four times a day (QID) | ORAL | 0 refills | Status: AC | PRN
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
## 2018-05-10 ENCOUNTER — Encounter: Payer: Self-pay | Admitting: Internal Medicine

## 2018-05-10 ENCOUNTER — Non-Acute Institutional Stay (SKILLED_NURSING_FACILITY): Payer: Medicare Other | Admitting: Internal Medicine

## 2018-05-10 DIAGNOSIS — S72409A Unspecified fracture of lower end of unspecified femur, initial encounter for closed fracture: Secondary | ICD-10-CM | POA: Diagnosis not present

## 2018-05-10 DIAGNOSIS — J90 Pleural effusion, not elsewhere classified: Secondary | ICD-10-CM

## 2018-05-10 NOTE — Patient Instructions (Signed)
See assessment and plan under each diagnosis in the problem list and acutely for this visit 

## 2018-05-10 NOTE — Assessment & Plan Note (Addendum)
Wound care to continue monitor of operative site.  Dr. Percell Miller will be contacted through Epic for instructions as a suture removal and weightbearing.

## 2018-05-10 NOTE — Assessment & Plan Note (Addendum)
Rub has resolved.  Rales do persist at the bases greater on the right than the left.  Incentive spirometry is to be continued as prophylaxis against secretion retention with risk of superimposed pneumonia. Once ambulatory repeat d-dimer to guide duration of Eliquis therapy.

## 2018-05-10 NOTE — Progress Notes (Signed)
NURSING HOME LOCATION:  [name] ROOM NUMBER:  220/A  CODE STATUS:  Full Code  PCP:  Gilberto Better MD  This is a nursing facility follow up of status of distal femur fracture and pleurisy.  Interim medical record and care since last [facility] visit was updated with review of diagnostic studies and change in clinical status since last visit were documented.  HPI: Because of the coronavirus related quarantine at the SNF, the patient has not been able to return to Dr. Debroah Loop office to have the sutures removed post ORIF 2/27.  [name] staff report they have been unable to receive instructions as to suture removal by phone. Also  PT/OT is seeking guidance concerning weightbearing status.  According to the patient assisted by PT/OT she has had minimal weightbearing on the left lower extremity.  Wound care nurse does monitor the operative site. She has completed her antibiotics and aggressive pulmonary toilet for the pleurisy with associated effusion.  Eliquis was prescribed in place of Lovenox because of pleuritic chest pain and possible hemoptysis in the context of persistent left lower extremity pain and extremely high d-dimer.  She continues to use incentive spirometry on a regular basis.  She validates that her respiratory status is significantly improved and now is manifest as minimal cough and sputum production.  Review of systems:  Constitutional: No fever, significant weight change, fatigue  ENT/mouth: No nasal congestion,  purulent discharge, earache, change in hearing, sore throat  Cardiovascular: No chest pain, palpitations, paroxysmal nocturnal dyspnea, edema  Gastrointestinal: No heartburn, dysphagia, abdominal pain, nausea /vomiting, rectal bleeding, melena, change in bowels Genitourinary: No dysuria, hematuria, pyuria, incontinence, nocturia Dermatologic: No rash, pruritus, change in appearance of skin Neurologic: No dizziness, headache, syncope, seizures,  numbness, tingling Psychiatric: No significant anxiety, depression, insomnia, anorexia Endocrine: No change in hair/skin/nails, excessive thirst, excessive hunger, excessive urination  Hematologic/lymphatic: No significant bruising, lymphadenopathy, abnormal bleeding (Note: on Eliquis) Allergy/immunology: No itchy/watery eyes, significant sneezing, urticaria, angioedema  Physical exam:  Pertinent or positive findings: She is markedly pale.  She is legally blind and stares blankly ahead. Temporal wasting is noted.  She has sticky rales in the right lower lobe with minor rales on the left as well.  The rub previously auscultated has resolved.  Ostomy is present.  Pedal pulses are decreased.  There is trace edema at the sock line.  She has sclerotic changes over the shins.  Left lower extremity is in a soft supportive base.  General appearance: no acute distress, increased work of breathing is present.   Lymphatic: No lymphadenopathy about the head, neck, axilla. Eyes: No conjunctival inflammation or lid edema is present. There is no scleral icterus. Ears:  External ear exam shows no significant lesions or deformities.   Nose:  External nasal examination shows no deformity or inflammation. Nasal mucosa are pink and moist without lesions, exudates Oral exam:  Lips and gums are healthy appearing. There is no oropharyngeal erythema or exudate. Neck:  No thyromegaly, masses, tenderness noted.    Heart:  Normal rate and regular rhythm. S1 and S2 normal without gallop, murmur, click, rub .  Lungs: without wheezes, rhonchi. Abdomen: Bowel sounds are normal. Abdomen is soft and nontender with no organomegaly, hernias, masses. GU: Deferred  Extremities:  No cyanosis, clubbing  Neurologic exam : Balance, Rhomberg, finger to nose testing could not be completed due to clinical state Skin: Warm & dry w/o tenting. No significant lesions or rash.  See summary under each active problem  in the Problem List  with associated updated therapeutic plan

## 2018-05-13 ENCOUNTER — Other Ambulatory Visit: Payer: Self-pay

## 2018-05-13 MED ORDER — HYDROCODONE-ACETAMINOPHEN 5-325 MG PO TABS: 1.0000 | ORAL_TABLET | Freq: Four times a day (QID) | ORAL | 0 refills | Status: DC | PRN

## 2018-05-13 MED ORDER — MORPHINE SULFATE ER 30 MG PO TBCR: 30.0000 mg | EXTENDED_RELEASE_TABLET | Freq: Two times a day (BID) | ORAL | 0 refills | Status: DC

## 2018-05-20 ENCOUNTER — Non-Acute Institutional Stay (SKILLED_NURSING_FACILITY): Payer: Medicare Other | Admitting: Internal Medicine

## 2018-05-20 ENCOUNTER — Encounter: Payer: Self-pay | Admitting: Internal Medicine

## 2018-05-20 DIAGNOSIS — Z933 Colostomy status: Secondary | ICD-10-CM

## 2018-05-20 DIAGNOSIS — G894 Chronic pain syndrome: Secondary | ICD-10-CM

## 2018-05-20 DIAGNOSIS — F329 Major depressive disorder, single episode, unspecified: Secondary | ICD-10-CM

## 2018-05-20 DIAGNOSIS — S7292XS Unspecified fracture of left femur, sequela: Secondary | ICD-10-CM

## 2018-05-20 DIAGNOSIS — J90 Pleural effusion, not elsewhere classified: Secondary | ICD-10-CM

## 2018-05-20 DIAGNOSIS — D508 Other iron deficiency anemias: Secondary | ICD-10-CM

## 2018-05-21 ENCOUNTER — Encounter: Payer: Self-pay | Admitting: Internal Medicine
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
## 2018-05-25 DIAGNOSIS — Z5181 Encounter for therapeutic drug level monitoring: Secondary | ICD-10-CM | POA: Diagnosis not present

## 2018-05-25 DIAGNOSIS — M5136 Other intervertebral disc degeneration, lumbar region: Secondary | ICD-10-CM | POA: Diagnosis not present

## 2018-05-25 DIAGNOSIS — Z8719 Personal history of other diseases of the digestive system: Secondary | ICD-10-CM | POA: Diagnosis not present

## 2018-05-25 DIAGNOSIS — J441 Chronic obstructive pulmonary disease with (acute) exacerbation: Secondary | ICD-10-CM | POA: Diagnosis not present

## 2018-05-25 DIAGNOSIS — Z933 Colostomy status: Secondary | ICD-10-CM | POA: Diagnosis not present

## 2018-05-25 DIAGNOSIS — R2689 Other abnormalities of gait and mobility: Secondary | ICD-10-CM | POA: Diagnosis not present

## 2018-05-25 DIAGNOSIS — S72465D Nondisplaced supracondylar fracture with intracondylar extension of lower end of left femur, subsequent encounter for closed fracture with routine healing: Secondary | ICD-10-CM | POA: Diagnosis not present

## 2018-05-25 DIAGNOSIS — Z86711 Personal history of pulmonary embolism: Secondary | ICD-10-CM | POA: Diagnosis not present

## 2018-05-25 DIAGNOSIS — W19XXXD Unspecified fall, subsequent encounter: Secondary | ICD-10-CM | POA: Diagnosis not present

## 2018-05-25 DIAGNOSIS — H548 Legal blindness, as defined in USA: Secondary | ICD-10-CM | POA: Diagnosis not present

## 2018-05-27 ENCOUNTER — Ambulatory Visit (INDEPENDENT_AMBULATORY_CARE_PROVIDER_SITE_OTHER): Payer: Medicare Other | Admitting: Internal Medicine

## 2018-05-27 ENCOUNTER — Other Ambulatory Visit: Payer: Self-pay

## 2018-05-27 DIAGNOSIS — J441 Chronic obstructive pulmonary disease with (acute) exacerbation: Secondary | ICD-10-CM

## 2018-05-27 DIAGNOSIS — W19XXXD Unspecified fall, subsequent encounter: Secondary | ICD-10-CM | POA: Diagnosis not present

## 2018-05-27 DIAGNOSIS — Z86711 Personal history of pulmonary embolism: Secondary | ICD-10-CM | POA: Diagnosis not present

## 2018-05-27 DIAGNOSIS — J449 Chronic obstructive pulmonary disease, unspecified: Secondary | ICD-10-CM | POA: Diagnosis not present

## 2018-05-27 DIAGNOSIS — G8929 Other chronic pain: Secondary | ICD-10-CM | POA: Diagnosis not present

## 2018-05-27 DIAGNOSIS — Z5181 Encounter for therapeutic drug level monitoring: Secondary | ICD-10-CM | POA: Diagnosis not present

## 2018-05-27 DIAGNOSIS — H548 Legal blindness, as defined in USA: Secondary | ICD-10-CM | POA: Diagnosis not present

## 2018-05-27 DIAGNOSIS — M4854XD Collapsed vertebra, not elsewhere classified, thoracic region, subsequent encounter for fracture with routine healing: Secondary | ICD-10-CM

## 2018-05-27 DIAGNOSIS — S7292XD Unspecified fracture of left femur, subsequent encounter for closed fracture with routine healing: Secondary | ICD-10-CM

## 2018-05-27 DIAGNOSIS — S72465D Nondisplaced supracondylar fracture with intracondylar extension of lower end of left femur, subsequent encounter for closed fracture with routine healing: Secondary | ICD-10-CM | POA: Diagnosis not present

## 2018-05-27 DIAGNOSIS — S7292XS Unspecified fracture of left femur, sequela: Secondary | ICD-10-CM

## 2018-05-27 DIAGNOSIS — Z8719 Personal history of other diseases of the digestive system: Secondary | ICD-10-CM | POA: Diagnosis not present

## 2018-05-27 DIAGNOSIS — Z933 Colostomy status: Secondary | ICD-10-CM | POA: Diagnosis not present

## 2018-05-27 DIAGNOSIS — M5136 Other intervertebral disc degeneration, lumbar region: Secondary | ICD-10-CM | POA: Diagnosis not present

## 2018-05-27 DIAGNOSIS — R2689 Other abnormalities of gait and mobility: Secondary | ICD-10-CM | POA: Diagnosis not present

## 2018-05-27 DIAGNOSIS — Z79891 Long term (current) use of opiate analgesic: Secondary | ICD-10-CM

## 2018-05-27 MED ORDER — MORPHINE SULFATE ER 30 MG PO TBCR: 30.0000 mg | EXTENDED_RELEASE_TABLET | Freq: Two times a day (BID) | ORAL | 0 refills | Status: DC

## 2018-05-27 MED ORDER — HYDROCODONE-ACETAMINOPHEN 7.5-325 MG PO TABS: 1.0000 | ORAL_TABLET | Freq: Four times a day (QID) | ORAL | 0 refills | Status: DC | PRN

## 2018-05-27 NOTE — Assessment & Plan Note (Signed)
Denies any symptoms of upper respiratory infection or COPD exacerbation.

## 2018-05-27 NOTE — Progress Notes (Signed)
Internal Medicine Clinic Attending  Case discussed with Dr. Amin at the time of the visit.  We reviewed the resident's history and exam and pertinent patient test results.  I agree with the assessment, diagnosis, and plan of care documented in the resident's note.    

## 2018-05-27 NOTE — Assessment & Plan Note (Signed)
Patient was recently discharged on 05/24/18 from a rehab facility where she was admitted after having a fall resulted in femur fracture.  On discharge she was provided with 2 of her morphine pills and 8 of Norco pills and was told that she will get more in the mail.  Patient has an history of chronic back pain due to compression fractures and now hip pain due to femur fracture and uses MS Contin 30 mg twice daily with Norco every 6 hourly for breakthrough pain.  Patient is experiencing a lot of pain as she ran out of her morphine, still having few Norco pills in hand but according to patient MS Contin works better for her and that way she does not use whole lot of Norco. She was not sure whether she will get her box of pain medication from facility or not. Database was checked and her last refill was done in February 2020, patient was in hospital and rehab facility for the past 52-month. Patient denies any excessive drowsiness or constipation. Her functional status significantly improves with pain control. Per patient if she received a box from her facility she will bring it to clinic and will not use it.  -She was provided with 1 month supply of MS Contin 30 mg twice daily with 60 pills and Norco 7.5-325 mg of 90 pills to be used every 6 hourly for breakthrough pain.  The benefits of continuing opioid therapy outweigh the risks and chronic opioids will be continued. Ongoing education about safe opioid treatment is provided.   Follow-up in 1 month for reassessment.

## 2018-05-27 NOTE — Progress Notes (Signed)
   This is a telephone encounter between [employer] and Lorella Nimrod on 05/27/2018 for [facility] follow-up for pain. The visit was conducted with the patient located at home and [place] at [facility]. The patient's identity was confirmed using their DOB and current address. The patient has consented to being evaluated through a telephone encounter and understands the associated risks (an examination cannot be done and the patient may need to come in for an appointment) / benefits (allows the patient to remain at home, decreasing exposure to coronavirus). I personally spent 10 minutes on medical discussion.   CC: Worsening back pain as she ran out of her pain meds.  HPI:  Ms.Shauntay Darnell Level Reininger is a 73 y.o. with past medical history as listed below was given a call for follow-up of her chronic pain.  She is having a lot of back pain and unable to function properly as she does not have any of her MS Contin.  She denies any other complaints.  She was sent home from her facility with rest of her medications and does not want any other refill at this time.  Please see assessment and plan for her chronic conditions.  Past Medical History:  Diagnosis Date  . Acute sinusitis 06/30/2011  . Anxiety   . Basal cell carcinoma    "left cheek"  . Bleeding ulcer   . Blind in both eyes   . Chronic back pain   . Degenerative disk disease    This is interscapular, mild compression frx of T12 superior endplate with Schmorl's node. Seen on CT in 2/08  . Depression   . Duodenal ulcer 11/08   With hemorrhage and obstruction  . History of blood transfusion    "related to bleeding from intestines and vomiting blood"  . Ischemic colitis ([place]) 07/30/2011   2009 by endoscopy   . Macular degeneration, wet ([place])   . Osteomyelitis, jaw acute 11/08   Started while in the hospital abcess showed GNR . SP debridement by Dr. Lilli Few,  Priscella Mann S Bovis sp 4  weeks of Pen V started on 05/19/07  with additional 2 weeks in 07/04/07  .  Perforated bowel [facility])    surgery july 2013  . Superior mesenteric artery syndrome ([place]) 11/08   sp dilation by Dr. Watt Climes, Pt may require bowel resetion if sx recur   Review of Systems: Positive for back pain and hip pain.  Physical Exam:  There were no vitals filed for this visit. Physical exam was not done as it was a tele-visit.  Assessment & Plan:   See Encounters Tab for problem based charting.  Patient discussed with Dr. Dareen Piano.

## 2018-05-27 NOTE — Assessment & Plan Note (Signed)
Patient is fully mobile now.  To experience intermittent pain in left hip, currently more than usual as she ran out of her pain meds.

## 2018-05-30 DIAGNOSIS — Z8719 Personal history of other diseases of the digestive system: Secondary | ICD-10-CM | POA: Diagnosis not present

## 2018-05-30 DIAGNOSIS — Z933 Colostomy status: Secondary | ICD-10-CM | POA: Diagnosis not present

## 2018-05-30 DIAGNOSIS — J441 Chronic obstructive pulmonary disease with (acute) exacerbation: Secondary | ICD-10-CM | POA: Diagnosis not present

## 2018-05-30 DIAGNOSIS — R2689 Other abnormalities of gait and mobility: Secondary | ICD-10-CM | POA: Diagnosis not present

## 2018-05-30 DIAGNOSIS — Z5181 Encounter for therapeutic drug level monitoring: Secondary | ICD-10-CM | POA: Diagnosis not present

## 2018-05-30 DIAGNOSIS — Z86711 Personal history of pulmonary embolism: Secondary | ICD-10-CM | POA: Diagnosis not present

## 2018-05-30 DIAGNOSIS — S72465D Nondisplaced supracondylar fracture with intracondylar extension of lower end of left femur, subsequent encounter for closed fracture with routine healing: Secondary | ICD-10-CM | POA: Diagnosis not present

## 2018-05-30 DIAGNOSIS — W19XXXD Unspecified fall, subsequent encounter: Secondary | ICD-10-CM | POA: Diagnosis not present

## 2018-05-30 DIAGNOSIS — M5136 Other intervertebral disc degeneration, lumbar region: Secondary | ICD-10-CM | POA: Diagnosis not present

## 2018-05-30 DIAGNOSIS — H548 Legal blindness, as defined in USA: Secondary | ICD-10-CM | POA: Diagnosis not present

## 2018-05-31 DIAGNOSIS — J441 Chronic obstructive pulmonary disease with (acute) exacerbation: Secondary | ICD-10-CM | POA: Diagnosis not present

## 2018-05-31 DIAGNOSIS — Z5181 Encounter for therapeutic drug level monitoring: Secondary | ICD-10-CM | POA: Diagnosis not present

## 2018-05-31 DIAGNOSIS — Z86711 Personal history of pulmonary embolism: Secondary | ICD-10-CM | POA: Diagnosis not present

## 2018-05-31 DIAGNOSIS — H548 Legal blindness, as defined in USA: Secondary | ICD-10-CM | POA: Diagnosis not present

## 2018-05-31 DIAGNOSIS — R2689 Other abnormalities of gait and mobility: Secondary | ICD-10-CM | POA: Diagnosis not present

## 2018-05-31 DIAGNOSIS — Z933 Colostomy status: Secondary | ICD-10-CM | POA: Diagnosis not present

## 2018-05-31 DIAGNOSIS — S72465D Nondisplaced supracondylar fracture with intracondylar extension of lower end of left femur, subsequent encounter for closed fracture with routine healing: Secondary | ICD-10-CM | POA: Diagnosis not present

## 2018-05-31 DIAGNOSIS — M5136 Other intervertebral disc degeneration, lumbar region: Secondary | ICD-10-CM | POA: Diagnosis not present

## 2018-05-31 DIAGNOSIS — W19XXXD Unspecified fall, subsequent encounter: Secondary | ICD-10-CM | POA: Diagnosis not present

## 2018-05-31 DIAGNOSIS — Z8719 Personal history of other diseases of the digestive system: Secondary | ICD-10-CM | POA: Diagnosis not present

## 2018-06-01 DIAGNOSIS — Z8719 Personal history of other diseases of the digestive system: Secondary | ICD-10-CM | POA: Diagnosis not present

## 2018-06-01 DIAGNOSIS — Z5181 Encounter for therapeutic drug level monitoring: Secondary | ICD-10-CM | POA: Diagnosis not present

## 2018-06-01 DIAGNOSIS — W19XXXD Unspecified fall, subsequent encounter: Secondary | ICD-10-CM | POA: Diagnosis not present

## 2018-06-01 DIAGNOSIS — H548 Legal blindness, as defined in USA: Secondary | ICD-10-CM | POA: Diagnosis not present

## 2018-06-01 DIAGNOSIS — Z86711 Personal history of pulmonary embolism: Secondary | ICD-10-CM | POA: Diagnosis not present

## 2018-06-01 DIAGNOSIS — J441 Chronic obstructive pulmonary disease with (acute) exacerbation: Secondary | ICD-10-CM | POA: Diagnosis not present

## 2018-06-01 DIAGNOSIS — S72465D Nondisplaced supracondylar fracture with intracondylar extension of lower end of left femur, subsequent encounter for closed fracture with routine healing: Secondary | ICD-10-CM | POA: Diagnosis not present

## 2018-06-01 DIAGNOSIS — R2689 Other abnormalities of gait and mobility: Secondary | ICD-10-CM | POA: Diagnosis not present

## 2018-06-01 DIAGNOSIS — M5136 Other intervertebral disc degeneration, lumbar region: Secondary | ICD-10-CM | POA: Diagnosis not present

## 2018-06-01 DIAGNOSIS — Z933 Colostomy status: Secondary | ICD-10-CM | POA: Diagnosis not present

## 2018-06-02 DIAGNOSIS — J441 Chronic obstructive pulmonary disease with (acute) exacerbation: Secondary | ICD-10-CM | POA: Diagnosis not present

## 2018-06-02 DIAGNOSIS — S72465D Nondisplaced supracondylar fracture with intracondylar extension of lower end of left femur, subsequent encounter for closed fracture with routine healing: Secondary | ICD-10-CM | POA: Diagnosis not present

## 2018-06-02 DIAGNOSIS — Z933 Colostomy status: Secondary | ICD-10-CM | POA: Diagnosis not present

## 2018-06-02 DIAGNOSIS — R2689 Other abnormalities of gait and mobility: Secondary | ICD-10-CM | POA: Diagnosis not present

## 2018-06-02 DIAGNOSIS — Z5181 Encounter for therapeutic drug level monitoring: Secondary | ICD-10-CM | POA: Diagnosis not present

## 2018-06-02 DIAGNOSIS — Z8719 Personal history of other diseases of the digestive system: Secondary | ICD-10-CM | POA: Diagnosis not present

## 2018-06-02 DIAGNOSIS — Z86711 Personal history of pulmonary embolism: Secondary | ICD-10-CM | POA: Diagnosis not present

## 2018-06-02 DIAGNOSIS — M5136 Other intervertebral disc degeneration, lumbar region: Secondary | ICD-10-CM | POA: Diagnosis not present

## 2018-06-02 DIAGNOSIS — W19XXXD Unspecified fall, subsequent encounter: Secondary | ICD-10-CM | POA: Diagnosis not present

## 2018-06-02 DIAGNOSIS — H548 Legal blindness, as defined in USA: Secondary | ICD-10-CM | POA: Diagnosis not present

## 2018-06-06 DIAGNOSIS — W19XXXD Unspecified fall, subsequent encounter: Secondary | ICD-10-CM | POA: Diagnosis not present

## 2018-06-06 DIAGNOSIS — Z86711 Personal history of pulmonary embolism: Secondary | ICD-10-CM | POA: Diagnosis not present

## 2018-06-06 DIAGNOSIS — H548 Legal blindness, as defined in USA: Secondary | ICD-10-CM | POA: Diagnosis not present

## 2018-06-06 DIAGNOSIS — S72465D Nondisplaced supracondylar fracture with intracondylar extension of lower end of left femur, subsequent encounter for closed fracture with routine healing: Secondary | ICD-10-CM | POA: Diagnosis not present

## 2018-06-06 DIAGNOSIS — M5136 Other intervertebral disc degeneration, lumbar region: Secondary | ICD-10-CM | POA: Diagnosis not present

## 2018-06-06 DIAGNOSIS — R2689 Other abnormalities of gait and mobility: Secondary | ICD-10-CM | POA: Diagnosis not present

## 2018-06-06 DIAGNOSIS — Z8719 Personal history of other diseases of the digestive system: Secondary | ICD-10-CM | POA: Diagnosis not present

## 2018-06-06 DIAGNOSIS — Z5181 Encounter for therapeutic drug level monitoring: Secondary | ICD-10-CM | POA: Diagnosis not present

## 2018-06-06 DIAGNOSIS — Z933 Colostomy status: Secondary | ICD-10-CM | POA: Diagnosis not present

## 2018-06-06 DIAGNOSIS — J441 Chronic obstructive pulmonary disease with (acute) exacerbation: Secondary | ICD-10-CM | POA: Diagnosis not present

## 2018-06-07 DIAGNOSIS — M5136 Other intervertebral disc degeneration, lumbar region: Secondary | ICD-10-CM | POA: Diagnosis not present

## 2018-06-07 DIAGNOSIS — Z5181 Encounter for therapeutic drug level monitoring: Secondary | ICD-10-CM | POA: Diagnosis not present

## 2018-06-07 DIAGNOSIS — W19XXXD Unspecified fall, subsequent encounter: Secondary | ICD-10-CM | POA: Diagnosis not present

## 2018-06-07 DIAGNOSIS — Z8719 Personal history of other diseases of the digestive system: Secondary | ICD-10-CM | POA: Diagnosis not present

## 2018-06-07 DIAGNOSIS — Z86711 Personal history of pulmonary embolism: Secondary | ICD-10-CM | POA: Diagnosis not present

## 2018-06-07 DIAGNOSIS — R2689 Other abnormalities of gait and mobility: Secondary | ICD-10-CM | POA: Diagnosis not present

## 2018-06-07 DIAGNOSIS — J441 Chronic obstructive pulmonary disease with (acute) exacerbation: Secondary | ICD-10-CM | POA: Diagnosis not present

## 2018-06-07 DIAGNOSIS — H548 Legal blindness, as defined in USA: Secondary | ICD-10-CM | POA: Diagnosis not present

## 2018-06-07 DIAGNOSIS — S72465D Nondisplaced supracondylar fracture with intracondylar extension of lower end of left femur, subsequent encounter for closed fracture with routine healing: Secondary | ICD-10-CM | POA: Diagnosis not present

## 2018-06-07 DIAGNOSIS — Z933 Colostomy status: Secondary | ICD-10-CM | POA: Diagnosis not present

## 2018-06-08 DIAGNOSIS — S72455D Nondisplaced supracondylar fracture without intracondylar extension of lower end of left femur, subsequent encounter for closed fracture with routine healing: Secondary | ICD-10-CM | POA: Diagnosis not present

## 2018-06-10 DIAGNOSIS — H548 Legal blindness, as defined in USA: Secondary | ICD-10-CM | POA: Diagnosis not present

## 2018-06-10 DIAGNOSIS — Z933 Colostomy status: Secondary | ICD-10-CM | POA: Diagnosis not present

## 2018-06-10 DIAGNOSIS — M5136 Other intervertebral disc degeneration, lumbar region: Secondary | ICD-10-CM | POA: Diagnosis not present

## 2018-06-10 DIAGNOSIS — J441 Chronic obstructive pulmonary disease with (acute) exacerbation: Secondary | ICD-10-CM | POA: Diagnosis not present

## 2018-06-10 DIAGNOSIS — Z8719 Personal history of other diseases of the digestive system: Secondary | ICD-10-CM | POA: Diagnosis not present

## 2018-06-10 DIAGNOSIS — W19XXXD Unspecified fall, subsequent encounter: Secondary | ICD-10-CM | POA: Diagnosis not present

## 2018-06-10 DIAGNOSIS — S72465D Nondisplaced supracondylar fracture with intracondylar extension of lower end of left femur, subsequent encounter for closed fracture with routine healing: Secondary | ICD-10-CM | POA: Diagnosis not present

## 2018-06-10 DIAGNOSIS — Z86711 Personal history of pulmonary embolism: Secondary | ICD-10-CM | POA: Diagnosis not present

## 2018-06-10 DIAGNOSIS — R2689 Other abnormalities of gait and mobility: Secondary | ICD-10-CM | POA: Diagnosis not present

## 2018-06-10 DIAGNOSIS — Z5181 Encounter for therapeutic drug level monitoring: Secondary | ICD-10-CM | POA: Diagnosis not present

## 2018-06-14 ENCOUNTER — Other Ambulatory Visit: Payer: Self-pay

## 2018-06-14 ENCOUNTER — Ambulatory Visit (INDEPENDENT_AMBULATORY_CARE_PROVIDER_SITE_OTHER): Payer: Medicare Other | Admitting: Internal Medicine

## 2018-06-14 ENCOUNTER — Encounter: Payer: Self-pay | Admitting: Internal Medicine

## 2018-06-14 VITALS — BP 103/60 | HR 108 | Temp 98.3°F | Ht 64.0 in

## 2018-06-14 DIAGNOSIS — K559 Vascular disorder of intestine, unspecified: Secondary | ICD-10-CM | POA: Diagnosis not present

## 2018-06-14 DIAGNOSIS — M4854XD Collapsed vertebra, not elsewhere classified, thoracic region, subsequent encounter for fracture with routine healing: Secondary | ICD-10-CM

## 2018-06-14 DIAGNOSIS — G8929 Other chronic pain: Secondary | ICD-10-CM | POA: Diagnosis not present

## 2018-06-14 DIAGNOSIS — Z933 Colostomy status: Secondary | ICD-10-CM

## 2018-06-14 DIAGNOSIS — J449 Chronic obstructive pulmonary disease, unspecified: Secondary | ICD-10-CM

## 2018-06-14 DIAGNOSIS — X58XXXD Exposure to other specified factors, subsequent encounter: Secondary | ICD-10-CM | POA: Diagnosis not present

## 2018-06-14 DIAGNOSIS — S7292XD Unspecified fracture of left femur, subsequent encounter for closed fracture with routine healing: Secondary | ICD-10-CM

## 2018-06-14 DIAGNOSIS — D509 Iron deficiency anemia, unspecified: Secondary | ICD-10-CM

## 2018-06-14 DIAGNOSIS — Z79891 Long term (current) use of opiate analgesic: Secondary | ICD-10-CM

## 2018-06-14 NOTE — Progress Notes (Signed)
CC: Back pain  HPI: [name] is a 73 y.o. F w/ PMH of ischemic colitis w/ permanent colostomy, chronic pain 2/2 thoracic compression fractures, iron deficiency anemia and COPD presenting for management of her chronic conditions. She states she was scared to come in to the clinic due to COVID-19 but was concerned about her pain med supply. She had an admission for femur fracture earlier this year and has been in rehab facility until recently. She states at discharge from her SNF, she was told her pain medications will be delivered to her home but the package never came. She had an appointment with Dr[facility]Amin at [facility] earlier this month where she received a 30 day supply and her chronic pain has been well managed. She mentions she had lot of anxiety about loss of her pain medications asks what would be the most appropriate to deal with the extra pain meds if they arrive.  Past Medical History:  Diagnosis Date  . Acute sinusitis 06/30/2011  . Anxiety   . Basal cell carcinoma    "left cheek"  . Bleeding ulcer   . Blind in both eyes   . Chronic back pain   . Degenerative disk disease    This is interscapular, mild compression frx of T12 superior endplate with Schmorl's node. Seen on CT in 2/08  . Depression   . Duodenal ulcer 11/08   With hemorrhage and obstruction  . History of blood transfusion    "related to bleeding from intestines and vomiting blood"  . Ischemic colitis ([place]) 07/30/2011   2009 by endoscopy   . Macular degeneration, wet ([place])   . Osteomyelitis, jaw acute 11/08   Started while in the hospital abcess showed GNR . SP debridement by Dr. Lilli Few,  Priscella Mann S Bovis sp 4  weeks of Pen V started on 05/19/07  with additional 2 weeks in 07/04/07  . Perforated bowel [facility])    surgery july 2013  . Superior mesenteric artery syndrome ([place]) 11/08   sp dilation by Dr. Watt Climes, Pt may require bowel resetion if sx recur   Review of Systems: Review of Systems  Constitutional: Negative for  chills, fever and malaise/fatigue.  Respiratory: Negative for cough and shortness of breath.   Gastrointestinal: Positive for abdominal pain. Negative for nausea and vomiting.  Musculoskeletal: Positive for back pain.  Neurological: Negative for dizziness and headaches.    Physical Exam: Vitals:   06/14/18 1534  BP: 103/60  Pulse: (!) 108  Temp: 98.3 F (36.8 C)  TempSrc: Oral  SpO2: 100%  Height: 5\' 4"  (1.626 m)   Physical Exam  Constitutional: She is oriented to person, place, and time. No distress.  Chronically ill-appearing  HENT:  Mouth/Throat: Oropharynx is clear and moist.  Cardiovascular: Normal rate, regular rhythm, normal heart sounds and intact distal pulses.  No murmur heard. Respiratory: Effort normal and breath sounds normal. She has no wheezes. She has no rales.  GI: Soft. Bowel sounds are normal. There is no abdominal tenderness.  Ostomy bag in place w/o significant output  Musculoskeletal:     Comments: Left leg in cast  Neurological: She is alert and oriented to person, place, and time.    Assessment & Plan:   Long term (current) use of opiate analgesic [name] Bell is on chronic opioid therapy for chronic pain from known compression fracture, now compounded with femur fracture plain. As part of the treatment plan, the [place] controlled substance database is checked at least twice yearly  and the database results was checked 06/14/18 and is appropriate.   The last UDS was on 02/08/18 and results were as expected. Patient needs a yearly UDS.  Pain contract was updated and signed on 02/08/18. Through the pain contract, [name] Bell describes commitment to avoid excessive opioid use and only relying on pain medications to pursue goal of ambulation and physical therapy  The patient is on MS contin 30mg  BID, #60 per 30 days & Norco 7.5-325mg  q6hr PRN #90. Adjunctive treatment includes physical therapy. This regimen allows [name] Bell to function without excessive  sedation or side effects. The benefits of continuing opioid therapy outweigh the risks and chronic opioids will be continued. Ongoing education about safe opioid treatment is provided.    - Still has supply for last visit w/ Dr.Amin. No refills at this time - Discussed and reassured [name] Bell that she can bring her extra pain medications to the clinic for appropriate disposal and such action will not negatively affect her chronic pain management. - F/u in 3 months    Patient discussed with Dr. Rebeca Alert   -Gilberto Better, PGY1

## 2018-06-14 NOTE — Patient Instructions (Addendum)
Thank you for allowing [place] to provide your care today. Today we discussed your chronic pain   I have ordered no labs for you.  Today we made no changes to your medications.    Please follow-up in 3 months.  If you have difficulty with transportation or you are concerned about COVID-19, you can always set up a telephone visit with [place].  Should you have any questions or concerns please call the internal medicine clinic at [phone].

## 2018-06-15 DIAGNOSIS — R2689 Other abnormalities of gait and mobility: Secondary | ICD-10-CM | POA: Diagnosis not present

## 2018-06-15 DIAGNOSIS — Z5181 Encounter for therapeutic drug level monitoring: Secondary | ICD-10-CM | POA: Diagnosis not present

## 2018-06-15 DIAGNOSIS — Z8719 Personal history of other diseases of the digestive system: Secondary | ICD-10-CM | POA: Diagnosis not present

## 2018-06-15 DIAGNOSIS — M5136 Other intervertebral disc degeneration, lumbar region: Secondary | ICD-10-CM | POA: Diagnosis not present

## 2018-06-15 DIAGNOSIS — Z86711 Personal history of pulmonary embolism: Secondary | ICD-10-CM | POA: Diagnosis not present

## 2018-06-15 DIAGNOSIS — S72455D Nondisplaced supracondylar fracture without intracondylar extension of lower end of left femur, subsequent encounter for closed fracture with routine healing: Secondary | ICD-10-CM | POA: Diagnosis not present

## 2018-06-15 DIAGNOSIS — S72465D Nondisplaced supracondylar fracture with intracondylar extension of lower end of left femur, subsequent encounter for closed fracture with routine healing: Secondary | ICD-10-CM | POA: Diagnosis not present

## 2018-06-15 DIAGNOSIS — J441 Chronic obstructive pulmonary disease with (acute) exacerbation: Secondary | ICD-10-CM | POA: Diagnosis not present

## 2018-06-15 DIAGNOSIS — W19XXXD Unspecified fall, subsequent encounter: Secondary | ICD-10-CM | POA: Diagnosis not present

## 2018-06-15 DIAGNOSIS — Z933 Colostomy status: Secondary | ICD-10-CM | POA: Diagnosis not present

## 2018-06-15 DIAGNOSIS — H548 Legal blindness, as defined in USA: Secondary | ICD-10-CM | POA: Diagnosis not present

## 2018-06-16 DIAGNOSIS — Z933 Colostomy status: Secondary | ICD-10-CM | POA: Diagnosis not present

## 2018-06-16 DIAGNOSIS — W19XXXD Unspecified fall, subsequent encounter: Secondary | ICD-10-CM | POA: Diagnosis not present

## 2018-06-16 DIAGNOSIS — H548 Legal blindness, as defined in USA: Secondary | ICD-10-CM | POA: Diagnosis not present

## 2018-06-16 DIAGNOSIS — M5136 Other intervertebral disc degeneration, lumbar region: Secondary | ICD-10-CM | POA: Diagnosis not present

## 2018-06-16 DIAGNOSIS — Z5181 Encounter for therapeutic drug level monitoring: Secondary | ICD-10-CM | POA: Diagnosis not present

## 2018-06-16 DIAGNOSIS — R2689 Other abnormalities of gait and mobility: Secondary | ICD-10-CM | POA: Diagnosis not present

## 2018-06-16 DIAGNOSIS — S72465D Nondisplaced supracondylar fracture with intracondylar extension of lower end of left femur, subsequent encounter for closed fracture with routine healing: Secondary | ICD-10-CM | POA: Diagnosis not present

## 2018-06-16 DIAGNOSIS — Z8719 Personal history of other diseases of the digestive system: Secondary | ICD-10-CM | POA: Diagnosis not present

## 2018-06-16 DIAGNOSIS — Z86711 Personal history of pulmonary embolism: Secondary | ICD-10-CM | POA: Diagnosis not present

## 2018-06-16 DIAGNOSIS — J441 Chronic obstructive pulmonary disease with (acute) exacerbation: Secondary | ICD-10-CM | POA: Diagnosis not present

## 2018-06-17 ENCOUNTER — Encounter: Payer: Self-pay | Admitting: Internal Medicine

## 2018-06-17 NOTE — Progress Notes (Signed)
Internal Medicine Clinic Attending  Case discussed with Dr. Lee at the time of the visit.  We reviewed the resident's history and exam and pertinent patient test results.  I agree with the assessment, diagnosis, and plan of care documented in the resident's note.  Alexander Raines, M.D., Ph.D.  

## 2018-06-17 NOTE — Assessment & Plan Note (Signed)
[name] Bell is on chronic opioid therapy for chronic pain from known compression fracture, now compounded with femur fracture plain. As part of the treatment plan, the [place] controlled substance database is checked at least twice yearly and the database results was checked 06/14/18 and is appropriate.   The last UDS was on 02/08/18 and results were as expected. Patient needs a yearly UDS.  Pain contract was updated and signed on 02/08/18. Through the pain contract, [name] Bell describes commitment to avoid excessive opioid use and only relying on pain medications to pursue goal of ambulation and physical therapy  The patient is on MS contin 30mg  BID, #60 per 30 days & Norco 7.5-325mg  q6hr PRN #90. Adjunctive treatment includes physical therapy. This regimen allows [name] Bell to function without excessive sedation or side effects. The benefits of continuing opioid therapy outweigh the risks and chronic opioids will be continued. Ongoing education about safe opioid treatment is provided.    - Still has supply for last visit w/ Dr.Amin. No refills at this time - Discussed and reassured [name] Bell that she can bring her extra pain medications to the clinic for appropriate disposal and such action will not negatively affect her chronic pain management. - F/u in 3 months

## 2018-06-21 DIAGNOSIS — M5136 Other intervertebral disc degeneration, lumbar region: Secondary | ICD-10-CM | POA: Diagnosis not present

## 2018-06-21 DIAGNOSIS — Z933 Colostomy status: Secondary | ICD-10-CM | POA: Diagnosis not present

## 2018-06-21 DIAGNOSIS — J441 Chronic obstructive pulmonary disease with (acute) exacerbation: Secondary | ICD-10-CM | POA: Diagnosis not present

## 2018-06-21 DIAGNOSIS — Z5181 Encounter for therapeutic drug level monitoring: Secondary | ICD-10-CM | POA: Diagnosis not present

## 2018-06-21 DIAGNOSIS — R2689 Other abnormalities of gait and mobility: Secondary | ICD-10-CM | POA: Diagnosis not present

## 2018-06-21 DIAGNOSIS — Z8719 Personal history of other diseases of the digestive system: Secondary | ICD-10-CM | POA: Diagnosis not present

## 2018-06-21 DIAGNOSIS — W19XXXD Unspecified fall, subsequent encounter: Secondary | ICD-10-CM | POA: Diagnosis not present

## 2018-06-21 DIAGNOSIS — H548 Legal blindness, as defined in USA: Secondary | ICD-10-CM | POA: Diagnosis not present

## 2018-06-21 DIAGNOSIS — Z86711 Personal history of pulmonary embolism: Secondary | ICD-10-CM | POA: Diagnosis not present

## 2018-06-21 DIAGNOSIS — S72465D Nondisplaced supracondylar fracture with intracondylar extension of lower end of left femur, subsequent encounter for closed fracture with routine healing: Secondary | ICD-10-CM | POA: Diagnosis not present

## 2018-06-22 DIAGNOSIS — J441 Chronic obstructive pulmonary disease with (acute) exacerbation: Secondary | ICD-10-CM | POA: Diagnosis not present

## 2018-06-22 DIAGNOSIS — Z933 Colostomy status: Secondary | ICD-10-CM | POA: Diagnosis not present

## 2018-06-22 DIAGNOSIS — H548 Legal blindness, as defined in USA: Secondary | ICD-10-CM | POA: Diagnosis not present

## 2018-06-22 DIAGNOSIS — Z8719 Personal history of other diseases of the digestive system: Secondary | ICD-10-CM | POA: Diagnosis not present

## 2018-06-22 DIAGNOSIS — R2689 Other abnormalities of gait and mobility: Secondary | ICD-10-CM | POA: Diagnosis not present

## 2018-06-22 DIAGNOSIS — W19XXXD Unspecified fall, subsequent encounter: Secondary | ICD-10-CM | POA: Diagnosis not present

## 2018-06-22 DIAGNOSIS — S72465D Nondisplaced supracondylar fracture with intracondylar extension of lower end of left femur, subsequent encounter for closed fracture with routine healing: Secondary | ICD-10-CM | POA: Diagnosis not present

## 2018-06-22 DIAGNOSIS — Z5181 Encounter for therapeutic drug level monitoring: Secondary | ICD-10-CM | POA: Diagnosis not present

## 2018-06-22 DIAGNOSIS — M5136 Other intervertebral disc degeneration, lumbar region: Secondary | ICD-10-CM | POA: Diagnosis not present

## 2018-06-22 DIAGNOSIS — Z86711 Personal history of pulmonary embolism: Secondary | ICD-10-CM | POA: Diagnosis not present

## 2018-06-23 ENCOUNTER — Other Ambulatory Visit: Payer: Self-pay | Admitting: Internal Medicine

## 2018-06-23 DIAGNOSIS — F329 Major depressive disorder, single episode, unspecified: Secondary | ICD-10-CM

## 2018-06-23 DIAGNOSIS — Z79891 Long term (current) use of opiate analgesic: Secondary | ICD-10-CM

## 2018-06-23 DIAGNOSIS — Z933 Colostomy status: Secondary | ICD-10-CM

## 2018-06-23 MED ORDER — HYDROCODONE-ACETAMINOPHEN 7.5-325 MG PO TABS: 1.0000 | ORAL_TABLET | Freq: Four times a day (QID) | ORAL | 0 refills | Status: DC | PRN

## 2018-06-23 MED ORDER — DULOXETINE HCL 60 MG PO CPEP: 60.0000 mg | ORAL_CAPSULE | Freq: Every day | ORAL | 3 refills | Status: DC

## 2018-06-23 MED ORDER — MORPHINE SULFATE ER 30 MG PO TBCR: 30.0000 mg | EXTENDED_RELEASE_TABLET | Freq: Two times a day (BID) | ORAL | 0 refills | Status: DC

## 2018-06-23 NOTE — Telephone Encounter (Signed)
morphine (MS CONTIN) 30 MG 12 hr tablet   HYDROcodone-acetaminophen (NORCO) 7.5-325 MG tablet   And anxiety med to be filled @  [id number] - [place], [place] [phone] (Phone) [phone] (Fax)

## 2018-06-23 NOTE — Telephone Encounter (Signed)
PDMP reviewed. Refills sent as appropriate.

## 2018-06-24 DIAGNOSIS — J441 Chronic obstructive pulmonary disease with (acute) exacerbation: Secondary | ICD-10-CM | POA: Diagnosis not present

## 2018-06-24 DIAGNOSIS — M5136 Other intervertebral disc degeneration, lumbar region: Secondary | ICD-10-CM | POA: Diagnosis not present

## 2018-06-24 DIAGNOSIS — Z5181 Encounter for therapeutic drug level monitoring: Secondary | ICD-10-CM | POA: Diagnosis not present

## 2018-06-24 DIAGNOSIS — Z933 Colostomy status: Secondary | ICD-10-CM | POA: Diagnosis not present

## 2018-06-24 DIAGNOSIS — Z86711 Personal history of pulmonary embolism: Secondary | ICD-10-CM | POA: Diagnosis not present

## 2018-06-24 DIAGNOSIS — R2689 Other abnormalities of gait and mobility: Secondary | ICD-10-CM | POA: Diagnosis not present

## 2018-06-24 DIAGNOSIS — H548 Legal blindness, as defined in USA: Secondary | ICD-10-CM | POA: Diagnosis not present

## 2018-06-24 DIAGNOSIS — W19XXXD Unspecified fall, subsequent encounter: Secondary | ICD-10-CM | POA: Diagnosis not present

## 2018-06-24 DIAGNOSIS — Z8719 Personal history of other diseases of the digestive system: Secondary | ICD-10-CM | POA: Diagnosis not present

## 2018-06-24 DIAGNOSIS — S72465D Nondisplaced supracondylar fracture with intracondylar extension of lower end of left femur, subsequent encounter for closed fracture with routine healing: Secondary | ICD-10-CM | POA: Diagnosis not present

## 2018-06-28 DIAGNOSIS — R2689 Other abnormalities of gait and mobility: Secondary | ICD-10-CM | POA: Diagnosis not present

## 2018-06-28 DIAGNOSIS — S72465D Nondisplaced supracondylar fracture with intracondylar extension of lower end of left femur, subsequent encounter for closed fracture with routine healing: Secondary | ICD-10-CM | POA: Diagnosis not present

## 2018-06-28 DIAGNOSIS — W19XXXD Unspecified fall, subsequent encounter: Secondary | ICD-10-CM | POA: Diagnosis not present

## 2018-06-28 DIAGNOSIS — M5136 Other intervertebral disc degeneration, lumbar region: Secondary | ICD-10-CM | POA: Diagnosis not present

## 2018-06-28 DIAGNOSIS — Z933 Colostomy status: Secondary | ICD-10-CM | POA: Diagnosis not present

## 2018-06-28 DIAGNOSIS — J441 Chronic obstructive pulmonary disease with (acute) exacerbation: Secondary | ICD-10-CM | POA: Diagnosis not present

## 2018-06-28 DIAGNOSIS — Z5181 Encounter for therapeutic drug level monitoring: Secondary | ICD-10-CM | POA: Diagnosis not present

## 2018-06-28 DIAGNOSIS — H548 Legal blindness, as defined in USA: Secondary | ICD-10-CM | POA: Diagnosis not present

## 2018-06-28 DIAGNOSIS — Z86711 Personal history of pulmonary embolism: Secondary | ICD-10-CM | POA: Diagnosis not present

## 2018-06-28 DIAGNOSIS — Z8719 Personal history of other diseases of the digestive system: Secondary | ICD-10-CM | POA: Diagnosis not present

## 2018-06-30 DIAGNOSIS — R2689 Other abnormalities of gait and mobility: Secondary | ICD-10-CM | POA: Diagnosis not present

## 2018-06-30 DIAGNOSIS — Z86711 Personal history of pulmonary embolism: Secondary | ICD-10-CM | POA: Diagnosis not present

## 2018-06-30 DIAGNOSIS — Z8719 Personal history of other diseases of the digestive system: Secondary | ICD-10-CM | POA: Diagnosis not present

## 2018-06-30 DIAGNOSIS — H548 Legal blindness, as defined in USA: Secondary | ICD-10-CM | POA: Diagnosis not present

## 2018-06-30 DIAGNOSIS — W19XXXD Unspecified fall, subsequent encounter: Secondary | ICD-10-CM | POA: Diagnosis not present

## 2018-06-30 DIAGNOSIS — J449 Chronic obstructive pulmonary disease, unspecified: Secondary | ICD-10-CM | POA: Diagnosis not present

## 2018-06-30 DIAGNOSIS — Z933 Colostomy status: Secondary | ICD-10-CM | POA: Diagnosis not present

## 2018-06-30 DIAGNOSIS — Z5181 Encounter for therapeutic drug level monitoring: Secondary | ICD-10-CM | POA: Diagnosis not present

## 2018-06-30 DIAGNOSIS — M5136 Other intervertebral disc degeneration, lumbar region: Secondary | ICD-10-CM | POA: Diagnosis not present

## 2018-06-30 DIAGNOSIS — J441 Chronic obstructive pulmonary disease with (acute) exacerbation: Secondary | ICD-10-CM | POA: Diagnosis not present

## 2018-06-30 DIAGNOSIS — R269 Unspecified abnormalities of gait and mobility: Secondary | ICD-10-CM | POA: Diagnosis not present

## 2018-06-30 DIAGNOSIS — S72465D Nondisplaced supracondylar fracture with intracondylar extension of lower end of left femur, subsequent encounter for closed fracture with routine healing: Secondary | ICD-10-CM | POA: Diagnosis not present

## 2018-07-05 DIAGNOSIS — Z86711 Personal history of pulmonary embolism: Secondary | ICD-10-CM | POA: Diagnosis not present

## 2018-07-05 DIAGNOSIS — R2689 Other abnormalities of gait and mobility: Secondary | ICD-10-CM | POA: Diagnosis not present

## 2018-07-05 DIAGNOSIS — H548 Legal blindness, as defined in USA: Secondary | ICD-10-CM | POA: Diagnosis not present

## 2018-07-05 DIAGNOSIS — W19XXXD Unspecified fall, subsequent encounter: Secondary | ICD-10-CM | POA: Diagnosis not present

## 2018-07-05 DIAGNOSIS — Z933 Colostomy status: Secondary | ICD-10-CM | POA: Diagnosis not present

## 2018-07-05 DIAGNOSIS — S72465D Nondisplaced supracondylar fracture with intracondylar extension of lower end of left femur, subsequent encounter for closed fracture with routine healing: Secondary | ICD-10-CM | POA: Diagnosis not present

## 2018-07-05 DIAGNOSIS — J441 Chronic obstructive pulmonary disease with (acute) exacerbation: Secondary | ICD-10-CM | POA: Diagnosis not present

## 2018-07-05 DIAGNOSIS — M5136 Other intervertebral disc degeneration, lumbar region: Secondary | ICD-10-CM | POA: Diagnosis not present

## 2018-07-05 DIAGNOSIS — Z8719 Personal history of other diseases of the digestive system: Secondary | ICD-10-CM | POA: Diagnosis not present

## 2018-07-05 DIAGNOSIS — Z5181 Encounter for therapeutic drug level monitoring: Secondary | ICD-10-CM | POA: Diagnosis not present

## 2018-07-07 DIAGNOSIS — R2689 Other abnormalities of gait and mobility: Secondary | ICD-10-CM | POA: Diagnosis not present

## 2018-07-07 DIAGNOSIS — Z933 Colostomy status: Secondary | ICD-10-CM | POA: Diagnosis not present

## 2018-07-07 DIAGNOSIS — H548 Legal blindness, as defined in USA: Secondary | ICD-10-CM | POA: Diagnosis not present

## 2018-07-07 DIAGNOSIS — W19XXXD Unspecified fall, subsequent encounter: Secondary | ICD-10-CM | POA: Diagnosis not present

## 2018-07-07 DIAGNOSIS — S72465D Nondisplaced supracondylar fracture with intracondylar extension of lower end of left femur, subsequent encounter for closed fracture with routine healing: Secondary | ICD-10-CM | POA: Diagnosis not present

## 2018-07-07 DIAGNOSIS — Z5181 Encounter for therapeutic drug level monitoring: Secondary | ICD-10-CM | POA: Diagnosis not present

## 2018-07-07 DIAGNOSIS — Z86711 Personal history of pulmonary embolism: Secondary | ICD-10-CM | POA: Diagnosis not present

## 2018-07-07 DIAGNOSIS — J441 Chronic obstructive pulmonary disease with (acute) exacerbation: Secondary | ICD-10-CM | POA: Diagnosis not present

## 2018-07-07 DIAGNOSIS — M5136 Other intervertebral disc degeneration, lumbar region: Secondary | ICD-10-CM | POA: Diagnosis not present

## 2018-07-07 DIAGNOSIS — Z8719 Personal history of other diseases of the digestive system: Secondary | ICD-10-CM | POA: Diagnosis not present

## 2018-07-11 DIAGNOSIS — Z5181 Encounter for therapeutic drug level monitoring: Secondary | ICD-10-CM | POA: Diagnosis not present

## 2018-07-11 DIAGNOSIS — W19XXXD Unspecified fall, subsequent encounter: Secondary | ICD-10-CM | POA: Diagnosis not present

## 2018-07-11 DIAGNOSIS — Z933 Colostomy status: Secondary | ICD-10-CM | POA: Diagnosis not present

## 2018-07-11 DIAGNOSIS — Z86711 Personal history of pulmonary embolism: Secondary | ICD-10-CM | POA: Diagnosis not present

## 2018-07-11 DIAGNOSIS — Z8719 Personal history of other diseases of the digestive system: Secondary | ICD-10-CM | POA: Diagnosis not present

## 2018-07-11 DIAGNOSIS — H548 Legal blindness, as defined in USA: Secondary | ICD-10-CM | POA: Diagnosis not present

## 2018-07-11 DIAGNOSIS — R2689 Other abnormalities of gait and mobility: Secondary | ICD-10-CM | POA: Diagnosis not present

## 2018-07-11 DIAGNOSIS — J441 Chronic obstructive pulmonary disease with (acute) exacerbation: Secondary | ICD-10-CM | POA: Diagnosis not present

## 2018-07-11 DIAGNOSIS — S72465D Nondisplaced supracondylar fracture with intracondylar extension of lower end of left femur, subsequent encounter for closed fracture with routine healing: Secondary | ICD-10-CM | POA: Diagnosis not present

## 2018-07-11 DIAGNOSIS — M5136 Other intervertebral disc degeneration, lumbar region: Secondary | ICD-10-CM | POA: Diagnosis not present

## 2018-07-14 DIAGNOSIS — Z933 Colostomy status: Secondary | ICD-10-CM | POA: Diagnosis not present

## 2018-07-14 DIAGNOSIS — Z5181 Encounter for therapeutic drug level monitoring: Secondary | ICD-10-CM | POA: Diagnosis not present

## 2018-07-14 DIAGNOSIS — S72465D Nondisplaced supracondylar fracture with intracondylar extension of lower end of left femur, subsequent encounter for closed fracture with routine healing: Secondary | ICD-10-CM | POA: Diagnosis not present

## 2018-07-14 DIAGNOSIS — J441 Chronic obstructive pulmonary disease with (acute) exacerbation: Secondary | ICD-10-CM | POA: Diagnosis not present

## 2018-07-14 DIAGNOSIS — W19XXXD Unspecified fall, subsequent encounter: Secondary | ICD-10-CM | POA: Diagnosis not present

## 2018-07-14 DIAGNOSIS — R2689 Other abnormalities of gait and mobility: Secondary | ICD-10-CM | POA: Diagnosis not present

## 2018-07-14 DIAGNOSIS — Z8719 Personal history of other diseases of the digestive system: Secondary | ICD-10-CM | POA: Diagnosis not present

## 2018-07-14 DIAGNOSIS — H548 Legal blindness, as defined in USA: Secondary | ICD-10-CM | POA: Diagnosis not present

## 2018-07-14 DIAGNOSIS — M5136 Other intervertebral disc degeneration, lumbar region: Secondary | ICD-10-CM | POA: Diagnosis not present

## 2018-07-14 DIAGNOSIS — Z86711 Personal history of pulmonary embolism: Secondary | ICD-10-CM | POA: Diagnosis not present

## 2018-07-22 DIAGNOSIS — M5136 Other intervertebral disc degeneration, lumbar region: Secondary | ICD-10-CM | POA: Diagnosis not present

## 2018-07-22 DIAGNOSIS — Z933 Colostomy status: Secondary | ICD-10-CM | POA: Diagnosis not present

## 2018-07-22 DIAGNOSIS — Z86711 Personal history of pulmonary embolism: Secondary | ICD-10-CM | POA: Diagnosis not present

## 2018-07-22 DIAGNOSIS — R2689 Other abnormalities of gait and mobility: Secondary | ICD-10-CM | POA: Diagnosis not present

## 2018-07-22 DIAGNOSIS — S72465D Nondisplaced supracondylar fracture with intracondylar extension of lower end of left femur, subsequent encounter for closed fracture with routine healing: Secondary | ICD-10-CM | POA: Diagnosis not present

## 2018-07-22 DIAGNOSIS — Z5181 Encounter for therapeutic drug level monitoring: Secondary | ICD-10-CM | POA: Diagnosis not present

## 2018-07-22 DIAGNOSIS — W19XXXD Unspecified fall, subsequent encounter: Secondary | ICD-10-CM | POA: Diagnosis not present

## 2018-07-22 DIAGNOSIS — Z8719 Personal history of other diseases of the digestive system: Secondary | ICD-10-CM | POA: Diagnosis not present

## 2018-07-22 DIAGNOSIS — J441 Chronic obstructive pulmonary disease with (acute) exacerbation: Secondary | ICD-10-CM | POA: Diagnosis not present

## 2018-07-22 DIAGNOSIS — H548 Legal blindness, as defined in USA: Secondary | ICD-10-CM | POA: Diagnosis not present

## 2018-07-23 DIAGNOSIS — Z86711 Personal history of pulmonary embolism: Secondary | ICD-10-CM | POA: Diagnosis not present

## 2018-07-23 DIAGNOSIS — Z8719 Personal history of other diseases of the digestive system: Secondary | ICD-10-CM | POA: Diagnosis not present

## 2018-07-23 DIAGNOSIS — R2689 Other abnormalities of gait and mobility: Secondary | ICD-10-CM | POA: Diagnosis not present

## 2018-07-23 DIAGNOSIS — H548 Legal blindness, as defined in USA: Secondary | ICD-10-CM | POA: Diagnosis not present

## 2018-07-23 DIAGNOSIS — S72465D Nondisplaced supracondylar fracture with intracondylar extension of lower end of left femur, subsequent encounter for closed fracture with routine healing: Secondary | ICD-10-CM | POA: Diagnosis not present

## 2018-07-23 DIAGNOSIS — Z5181 Encounter for therapeutic drug level monitoring: Secondary | ICD-10-CM | POA: Diagnosis not present

## 2018-07-23 DIAGNOSIS — M5136 Other intervertebral disc degeneration, lumbar region: Secondary | ICD-10-CM | POA: Diagnosis not present

## 2018-07-23 DIAGNOSIS — J441 Chronic obstructive pulmonary disease with (acute) exacerbation: Secondary | ICD-10-CM | POA: Diagnosis not present

## 2018-07-23 DIAGNOSIS — W19XXXD Unspecified fall, subsequent encounter: Secondary | ICD-10-CM | POA: Diagnosis not present

## 2018-07-23 DIAGNOSIS — Z933 Colostomy status: Secondary | ICD-10-CM | POA: Diagnosis not present

## 2018-07-27 ENCOUNTER — Other Ambulatory Visit: Payer: Self-pay

## 2018-07-27 DIAGNOSIS — S72455D Nondisplaced supracondylar fracture without intracondylar extension of lower end of left femur, subsequent encounter for closed fracture with routine healing: Secondary | ICD-10-CM | POA: Diagnosis not present

## 2018-07-27 DIAGNOSIS — Z79891 Long term (current) use of opiate analgesic: Secondary | ICD-10-CM

## 2018-07-27 MED ORDER — MORPHINE SULFATE ER 30 MG PO TBCR: 30.0000 mg | EXTENDED_RELEASE_TABLET | Freq: Two times a day (BID) | ORAL | 0 refills | Status: DC

## 2018-07-27 MED ORDER — HYDROCODONE-ACETAMINOPHEN 7.5-325 MG PO TABS: 1.0000 | ORAL_TABLET | Freq: Four times a day (QID) | ORAL | 0 refills | Status: DC | PRN

## 2018-07-27 NOTE — Telephone Encounter (Signed)
HYDROcodone-acetaminophen (NORCO) 7.5-325 MG tablet    morphine (MS CONTIN) 30 MG 12 hr tablet, REFILL REQUEST @  [id number] - [place], [place] - [place] [phone] (Phone) [phone] (Fax)

## 2018-08-02 DIAGNOSIS — Z933 Colostomy status: Secondary | ICD-10-CM | POA: Diagnosis not present

## 2018-08-02 DIAGNOSIS — Z5181 Encounter for therapeutic drug level monitoring: Secondary | ICD-10-CM | POA: Diagnosis not present

## 2018-08-02 DIAGNOSIS — R2689 Other abnormalities of gait and mobility: Secondary | ICD-10-CM | POA: Diagnosis not present

## 2018-08-02 DIAGNOSIS — S72465D Nondisplaced supracondylar fracture with intracondylar extension of lower end of left femur, subsequent encounter for closed fracture with routine healing: Secondary | ICD-10-CM | POA: Diagnosis not present

## 2018-08-02 DIAGNOSIS — Z86711 Personal history of pulmonary embolism: Secondary | ICD-10-CM | POA: Diagnosis not present

## 2018-08-02 DIAGNOSIS — Z8719 Personal history of other diseases of the digestive system: Secondary | ICD-10-CM | POA: Diagnosis not present

## 2018-08-02 DIAGNOSIS — H548 Legal blindness, as defined in USA: Secondary | ICD-10-CM | POA: Diagnosis not present

## 2018-08-02 DIAGNOSIS — W19XXXD Unspecified fall, subsequent encounter: Secondary | ICD-10-CM | POA: Diagnosis not present

## 2018-08-02 DIAGNOSIS — J441 Chronic obstructive pulmonary disease with (acute) exacerbation: Secondary | ICD-10-CM | POA: Diagnosis not present

## 2018-08-02 DIAGNOSIS — M5136 Other intervertebral disc degeneration, lumbar region: Secondary | ICD-10-CM | POA: Diagnosis not present

## 2018-08-03 ENCOUNTER — Encounter: Payer: Self-pay | Admitting: Podiatry

## 2018-08-03 ENCOUNTER — Other Ambulatory Visit: Payer: Self-pay

## 2018-08-03 ENCOUNTER — Ambulatory Visit (INDEPENDENT_AMBULATORY_CARE_PROVIDER_SITE_OTHER): Payer: Medicare Other | Admitting: Podiatry

## 2018-08-03 DIAGNOSIS — B351 Tinea unguium: Secondary | ICD-10-CM | POA: Diagnosis not present

## 2018-08-03 DIAGNOSIS — M79675 Pain in left toe(s): Secondary | ICD-10-CM | POA: Diagnosis not present

## 2018-08-03 DIAGNOSIS — M79674 Pain in right toe(s): Secondary | ICD-10-CM

## 2018-08-03 NOTE — Progress Notes (Signed)
Complaint:  Visit Type: Patient presents  to my office for  preventative foot care services. Complaint: Patient states" my nails have grown long and thick and become painful to walk and wear shoes"  The patient presents for preventative foot care services. Patient is brought to the office in a wheelchair due to fracture left femur.  She presents for mpreventative foot care services.  Podiatric Exam: Vascular: dorsalis pedis and posterior tibial pulses are weakly  palpable bilateral. Capillary return is immediate. Cold feet noted.. Skin turgor WNL  Sensorium: Normal Semmes Weinstein monofilament test. Normal tactile sensation bilaterally. Nail Exam: Pt has thick disfigured discolored nails with subungual debris noted bilateral entire nail hallux through fifth toenails Ulcer Exam: There is no evidence of ulcer or pre-ulcerative changes or infection. Orthopedic Exam: Muscle tone and strength are WNL. No limitations in general ROM. No crepitus or effusions noted. Foot type and digits show no abnormalities. Bony prominences are unremarkable. Skin: No Porokeratosis. No infection or ulcers.    Diagnosis:  Onychomycosis, , Pain in right toe, pain in left toes  Treatment & Plan Procedures and Treatment: Consent by patient was obtained for treatment procedures.   Debridement of mycotic and hypertrophic toenails, 1 through 5 bilateral and clearing of subungual debris. No ulceration, no infection noted.  Return Visit-Office Procedure: Patient instructed to return to the office for a follow up visit 3 months for continued evaluation and treatment.    Gardiner Barefoot DPM

## 2018-08-04 DIAGNOSIS — W19XXXD Unspecified fall, subsequent encounter: Secondary | ICD-10-CM | POA: Diagnosis not present

## 2018-08-04 DIAGNOSIS — Z86711 Personal history of pulmonary embolism: Secondary | ICD-10-CM | POA: Diagnosis not present

## 2018-08-04 DIAGNOSIS — H548 Legal blindness, as defined in USA: Secondary | ICD-10-CM | POA: Diagnosis not present

## 2018-08-04 DIAGNOSIS — M5136 Other intervertebral disc degeneration, lumbar region: Secondary | ICD-10-CM | POA: Diagnosis not present

## 2018-08-04 DIAGNOSIS — Z933 Colostomy status: Secondary | ICD-10-CM | POA: Diagnosis not present

## 2018-08-04 DIAGNOSIS — Z5181 Encounter for therapeutic drug level monitoring: Secondary | ICD-10-CM | POA: Diagnosis not present

## 2018-08-04 DIAGNOSIS — Z8719 Personal history of other diseases of the digestive system: Secondary | ICD-10-CM | POA: Diagnosis not present

## 2018-08-04 DIAGNOSIS — R2689 Other abnormalities of gait and mobility: Secondary | ICD-10-CM | POA: Diagnosis not present

## 2018-08-04 DIAGNOSIS — S72465D Nondisplaced supracondylar fracture with intracondylar extension of lower end of left femur, subsequent encounter for closed fracture with routine healing: Secondary | ICD-10-CM | POA: Diagnosis not present

## 2018-08-04 DIAGNOSIS — J441 Chronic obstructive pulmonary disease with (acute) exacerbation: Secondary | ICD-10-CM | POA: Diagnosis not present

## 2018-08-05 ENCOUNTER — Other Ambulatory Visit: Payer: Self-pay | Admitting: Internal Medicine

## 2018-08-09 ENCOUNTER — Encounter: Payer: Self-pay | Admitting: Internal Medicine

## 2018-08-09 ENCOUNTER — Ambulatory Visit (INDEPENDENT_AMBULATORY_CARE_PROVIDER_SITE_OTHER): Payer: Medicare Other | Admitting: Internal Medicine

## 2018-08-09 ENCOUNTER — Other Ambulatory Visit: Payer: Self-pay

## 2018-08-09 VITALS — BP 112/65 | HR 114 | Temp 98.4°F | Wt 108.8 lb

## 2018-08-09 DIAGNOSIS — G8929 Other chronic pain: Secondary | ICD-10-CM

## 2018-08-09 DIAGNOSIS — F339 Major depressive disorder, recurrent, unspecified: Secondary | ICD-10-CM

## 2018-08-09 DIAGNOSIS — S7292XD Unspecified fracture of left femur, subsequent encounter for closed fracture with routine healing: Secondary | ICD-10-CM

## 2018-08-09 DIAGNOSIS — S7292XS Unspecified fracture of left femur, sequela: Secondary | ICD-10-CM

## 2018-08-09 DIAGNOSIS — Z79891 Long term (current) use of opiate analgesic: Secondary | ICD-10-CM

## 2018-08-09 DIAGNOSIS — M7989 Other specified soft tissue disorders: Secondary | ICD-10-CM

## 2018-08-09 DIAGNOSIS — Z79899 Other long term (current) drug therapy: Secondary | ICD-10-CM

## 2018-08-09 DIAGNOSIS — Z933 Colostomy status: Secondary | ICD-10-CM

## 2018-08-09 DIAGNOSIS — X58XXXD Exposure to other specified factors, subsequent encounter: Secondary | ICD-10-CM | POA: Diagnosis not present

## 2018-08-09 DIAGNOSIS — F329 Major depressive disorder, single episode, unspecified: Secondary | ICD-10-CM

## 2018-08-09 DIAGNOSIS — J449 Chronic obstructive pulmonary disease, unspecified: Secondary | ICD-10-CM

## 2018-08-09 DIAGNOSIS — D509 Iron deficiency anemia, unspecified: Secondary | ICD-10-CM

## 2018-08-09 DIAGNOSIS — F419 Anxiety disorder, unspecified: Secondary | ICD-10-CM

## 2018-08-09 DIAGNOSIS — M4854XS Collapsed vertebra, not elsewhere classified, thoracic region, sequela of fracture: Secondary | ICD-10-CM | POA: Diagnosis not present

## 2018-08-09 MED ORDER — FUROSEMIDE 40 MG PO TABS: 40.0000 mg | ORAL_TABLET | Freq: Every day | ORAL | 0 refills | Status: DC

## 2018-08-09 MED ORDER — TRAZODONE HCL 50 MG PO TABS: 25.0000 mg | ORAL_TABLET | Freq: Two times a day (BID) | ORAL | 0 refills | Status: DC

## 2018-08-09 MED ORDER — WHITE PETROLATUM GEL: 1.0000 "application " | 0 refills | Status: DC | PRN

## 2018-08-09 MED ORDER — APIXABAN 5 MG PO TABS: 5.0000 mg | ORAL_TABLET | Freq: Two times a day (BID) | ORAL | 0 refills | Status: DC

## 2018-08-09 NOTE — Patient Instructions (Addendum)
Thank you for allowing [place] to provide your care today. Today we discussed your leg swelling.    I have ordered bmp labs for you. I will call if any are abnormal.    Today we made the following changes to your medications:  - Start Eliquis 5mg  BID - Start furosemide 40mg  daily - Please get your legs scanned for blood clot  Please follow-up in 2 weeks.   Should you have any questions or concerns please call the internal medicine clinic at [phone].

## 2018-08-09 NOTE — Progress Notes (Addendum)
CC: Lower extremity swelling  HPI: [name] Bell is a 73 y.o.F w/ PMH of ischemic colitis w/ permanent colostomy, chronic pain 2/2 thoracic compression fractures, iron deficiency anemia and COPD presenting to the clinic for lower extremity swelling.  She mentions that when she was at [facility] for her rehab after her hip fracture she was on Eliquis for prevention of DVT and stroke.  She mentions that at discharge she was prescribed Eliquis but she had lost the pill bottle and for the last month she has not taken any of her Eliquis. She also mentions that she was also discharged with 20 mg of furosemide daily which she has also ran out of and is now complaining of 1 week onset progressive lower extremity swelling with itchy skin with 'leakage of fluids' from her legs. She mentions that she wakes up with wet bedsheets often. She denies any shortness of breath cough orthopnea, chest pain palpitations. When asked about the asymmetry in her leg size, she mentions that after her left hip fracture, she has not been using her left leg as much as her right leg believes it is due to 'not using it as much as her right leg.'  Past Medical History:  Diagnosis Date  . Abnormal chest x-ray 03/29/2018   [facility] PCXR 2/27& 03/28/2018 R perihilar/upper lobe ill-defined infiltrate suggested on the second film. WBC normal with minimal left shift.  Suggestion of R CPA blunting & possible effusion LLL. Rhonchi & wheezing ; O2 sats < 90%.  Purulent sputum was described to her by hospital staff.Rx for Augmentin,steroids, nebs 3/13 mobile imaging PCXR: RUL infiltrate essentially resolved; new RLL oval infiltra  . Acute sinusitis 06/30/2011  . Anxiety   . Basal cell carcinoma    "left cheek"  . Bleeding ulcer   . Blind in both eyes   . Chronic back pain   . Degenerative disk disease    This is interscapular, mild compression frx of T12 superior endplate with Schmorl's node. Seen on CT in 2/08  . Depression   . Duodenal  ulcer 11/08   With hemorrhage and obstruction  . Hair loss 06/24/2016  . History of blood transfusion    "related to bleeding from intestines and vomiting blood"  . Ischemic colitis ([place]) 07/30/2011   2009 by endoscopy   . Macular degeneration, wet ([place])   . Osteomyelitis, jaw acute 11/08   Started while in the hospital abcess showed GNR . SP debridement by Dr. Lilli Few,  Priscella Mann S Bovis sp 4  weeks of Pen V started on 05/19/07  with additional 2 weeks in 07/04/07  . Perforated bowel [facility])    surgery july 2013  . Pleurisy with pleural effusion 04/16/2018   See 04/14/2018 SNF notes Lovenox changed to Eliquis because of pleuritic pain, persistent left lower extremity pain, and possible hemoptysis. There was a dramatic elevation of d-dimer with a value of 2320  . Sinus tachycardia 09/11/2015  . Superior mesenteric artery syndrome ([place]) 11/08   sp dilation by Dr. Watt Climes, Pt may require bowel resetion if sx recur   Review of Systems: Review of Systems  Constitutional: Positive for malaise/fatigue. Negative for chills and fever.  Respiratory: Negative for cough, hemoptysis and shortness of breath.   Cardiovascular: Positive for leg swelling. Negative for chest pain and palpitations.  Gastrointestinal: Negative for nausea and vomiting.  Genitourinary: Negative for dysuria and urgency.       Denies urinary incontinence  Skin: Positive for itching.    Physical  Exam: Vitals:   08/09/18 1437  BP: 112/65  Pulse: (!) 114  Temp: 98.4 F (36.9 C)  TempSrc: Oral  SpO2: 98%  Weight: 108 lb 12.8 oz (49.4 kg)   Physical Exam  Constitutional: She is oriented to person, place, and time. She appears well-developed and well-nourished. No distress.  HENT:  Mouth/Throat: Oropharynx is clear and moist.  Eyes: Conjunctivae are normal. No scleral icterus.  Neck: Normal range of motion. Neck supple. No JVD present.  Respiratory: Effort normal and breath sounds normal. She has no rales.  GI: Soft. Bowel sounds  are normal. There is no abdominal tenderness.  Colostomy bag in place without leakage or surrounding erythema  Musculoskeletal: Normal range of motion.        General: Edema (2+ pitting edema on bilateral lower extremities. R leg > in diameter than left) present.  Neurological: She is alert and oriented to person, place, and time.    Assessment & Plan:   Major depression, chronic Presents w/ History of depression and anxiety initiated on duloxetine while at [facility]. She has been unable to tolerate her duloxetine at home due to nausea and state she has not taken it for a 2 months. She mentions that her anxiety appears to be worsening and requests alternative therapy. Per chart review, was on trazodone in the past with good tolerance.   - Start trazodone 25mg  BID  Femur fracture, left ([place]) Per chart review, was started on Eliquis at [facility] for DVT/Stroke prevention after hip fracture repair. At discharge was recommended to continue therapy due to 'abnormally high D-dimer' and continue to re-assess to determine proper duration but Ms.Wann states she lost her medicine bag and has not been taking it for the last month. On exam, increased diameter of right calf w/o tenderness. Wells score of 3 points  - Restart Eliquis 5mg  Bid - [place] lower extremities to r/o DVT  Swelling of lower extremity Presents w/ complaints of lower extremity swelling with fluid drainage. On exam show bilateral lower extremity swelling but with asymmetry as described under ' hip fracture' Also w/ pink skin changes consistent with venous stasis dermatitis. Ms.Seat states she was on furosemide 20mg  but has run out since couple weeks ago and has been having worsening lower extremity swelling. No hx of CHF. Hx of chronic hypoalbuminemia without proteinuria presumed to be due to poor nutrition.  - DVT US / restart eliquis as stated above - Will need echocardiogram to r/o new diagnoses of CHF if not DVT - BMP - Start  furosemide 40mg  daily - F/u in 1 week   Patient discussed with Dr. Rebeca Alert   -Gilberto Better, PGY2 Pager: [phone]

## 2018-08-10 ENCOUNTER — Encounter: Payer: Self-pay | Admitting: Internal Medicine

## 2018-08-10 DIAGNOSIS — Z13820 Encounter for screening for osteoporosis: Secondary | ICD-10-CM | POA: Diagnosis not present

## 2018-08-10 DIAGNOSIS — M7989 Other specified soft tissue disorders: Secondary | ICD-10-CM | POA: Insufficient documentation

## 2018-08-10 LAB — BMP8+ANION GAP
Anion Gap: 20 mmol/L — ABNORMAL HIGH (ref 10.0–18.0)
BUN/Creatinine Ratio: 12 (ref 12–28)
BUN: 9 mg/dL (ref 8–27)
CO2: 24 mmol/L (ref 20–29)
Calcium: 8.5 mg/dL — ABNORMAL LOW (ref 8.7–10.3)
Chloride: 95 mmol/L — ABNORMAL LOW (ref 96–106)
Creatinine, Ser: 0.77 mg/dL (ref 0.57–1.00)
GFR calc Af Amer: 88 mL/min/{1.73_m2} (ref >59)
GFR calc non Af Amer: 76 mL/min/{1.73_m2} (ref >59)
Glucose: 125 mg/dL — ABNORMAL HIGH (ref 65–99)
Potassium: 4 mmol/L (ref 3.5–5.2)
Sodium: 139 mmol/L (ref 134–144)

## 2018-08-10 NOTE — Assessment & Plan Note (Signed)
Per chart review, was started on Eliquis at [facility] for DVT/Stroke prevention after hip fracture repair. At discharge was recommended to continue therapy due to 'abnormally high D-dimer' and continue to re-assess to determine proper duration but [name] Bell states she lost her medicine bag and has not been taking it for the last month. On exam, increased diameter of right calf w/o tenderness. Wells score of 3 points  - Restart Eliquis 5mg  Bid - [place] lower extremities to r/o DVT

## 2018-08-10 NOTE — Assessment & Plan Note (Addendum)
Presents w/ complaints of lower extremity swelling with fluid drainage. On exam show bilateral lower extremity swelling but with asymmetry as described under ' hip fracture' Also w/ pink skin changes consistent with venous stasis dermatitis. [name] Bell states she was on furosemide 20mg  but has run out since couple weeks ago and has been having worsening lower extremity swelling. No hx of CHF. Hx of chronic hypoalbuminemia without proteinuria presumed to be due to poor nutrition.  - DVT US / restart eliquis as stated above - Will need echocardiogram to r/o new diagnoses of CHF if not DVT - BMP - Start furosemide 40mg  daily - F/u in 1 week

## 2018-08-10 NOTE — Progress Notes (Signed)
Internal Medicine Clinic Attending  Case discussed with Dr. Lee at the time of the visit.  We reviewed the resident's history and exam and pertinent patient test results.  I agree with the assessment, diagnosis, and plan of care documented in the resident's note.  Alexander Raines, M.D., Ph.D.  

## 2018-08-10 NOTE — Assessment & Plan Note (Signed)
Presents w/ History of depression and anxiety initiated on duloxetine while at [facility]. She has been unable to tolerate her duloxetine at home due to nausea and state she has not taken it for a 2 months. She mentions that her anxiety appears to be worsening and requests alternative therapy. Per chart review, was on trazodone in the past with good tolerance.   - Start trazodone 25mg  BID

## 2018-08-12 ENCOUNTER — Ambulatory Visit (HOSPITAL_COMMUNITY): Payer: Medicare Other

## 2018-08-12 DIAGNOSIS — S72465D Nondisplaced supracondylar fracture with intracondylar extension of lower end of left femur, subsequent encounter for closed fracture with routine healing: Secondary | ICD-10-CM | POA: Diagnosis not present

## 2018-08-12 DIAGNOSIS — M5136 Other intervertebral disc degeneration, lumbar region: Secondary | ICD-10-CM | POA: Diagnosis not present

## 2018-08-12 DIAGNOSIS — W19XXXD Unspecified fall, subsequent encounter: Secondary | ICD-10-CM | POA: Diagnosis not present

## 2018-08-12 DIAGNOSIS — Z8719 Personal history of other diseases of the digestive system: Secondary | ICD-10-CM | POA: Diagnosis not present

## 2018-08-12 DIAGNOSIS — Z5181 Encounter for therapeutic drug level monitoring: Secondary | ICD-10-CM | POA: Diagnosis not present

## 2018-08-12 DIAGNOSIS — Z933 Colostomy status: Secondary | ICD-10-CM | POA: Diagnosis not present

## 2018-08-12 DIAGNOSIS — R2689 Other abnormalities of gait and mobility: Secondary | ICD-10-CM | POA: Diagnosis not present

## 2018-08-12 DIAGNOSIS — H548 Legal blindness, as defined in USA: Secondary | ICD-10-CM | POA: Diagnosis not present

## 2018-08-12 DIAGNOSIS — Z86711 Personal history of pulmonary embolism: Secondary | ICD-10-CM | POA: Diagnosis not present

## 2018-08-12 DIAGNOSIS — J441 Chronic obstructive pulmonary disease with (acute) exacerbation: Secondary | ICD-10-CM | POA: Diagnosis not present

## 2018-08-18 DIAGNOSIS — W19XXXD Unspecified fall, subsequent encounter: Secondary | ICD-10-CM | POA: Diagnosis not present

## 2018-08-18 DIAGNOSIS — S72465D Nondisplaced supracondylar fracture with intracondylar extension of lower end of left femur, subsequent encounter for closed fracture with routine healing: Secondary | ICD-10-CM | POA: Diagnosis not present

## 2018-08-18 DIAGNOSIS — R2689 Other abnormalities of gait and mobility: Secondary | ICD-10-CM | POA: Diagnosis not present

## 2018-08-18 DIAGNOSIS — Z5181 Encounter for therapeutic drug level monitoring: Secondary | ICD-10-CM | POA: Diagnosis not present

## 2018-08-18 DIAGNOSIS — J441 Chronic obstructive pulmonary disease with (acute) exacerbation: Secondary | ICD-10-CM | POA: Diagnosis not present

## 2018-08-18 DIAGNOSIS — Z933 Colostomy status: Secondary | ICD-10-CM | POA: Diagnosis not present

## 2018-08-18 DIAGNOSIS — Z86711 Personal history of pulmonary embolism: Secondary | ICD-10-CM | POA: Diagnosis not present

## 2018-08-18 DIAGNOSIS — M5136 Other intervertebral disc degeneration, lumbar region: Secondary | ICD-10-CM | POA: Diagnosis not present

## 2018-08-18 DIAGNOSIS — H548 Legal blindness, as defined in USA: Secondary | ICD-10-CM | POA: Diagnosis not present

## 2018-08-18 DIAGNOSIS — Z8719 Personal history of other diseases of the digestive system: Secondary | ICD-10-CM | POA: Diagnosis not present

## 2018-08-22 DIAGNOSIS — W19XXXD Unspecified fall, subsequent encounter: Secondary | ICD-10-CM | POA: Diagnosis not present

## 2018-08-22 DIAGNOSIS — S72465D Nondisplaced supracondylar fracture with intracondylar extension of lower end of left femur, subsequent encounter for closed fracture with routine healing: Secondary | ICD-10-CM | POA: Diagnosis not present

## 2018-08-22 DIAGNOSIS — Z933 Colostomy status: Secondary | ICD-10-CM | POA: Diagnosis not present

## 2018-08-22 DIAGNOSIS — M5136 Other intervertebral disc degeneration, lumbar region: Secondary | ICD-10-CM | POA: Diagnosis not present

## 2018-08-22 DIAGNOSIS — R2689 Other abnormalities of gait and mobility: Secondary | ICD-10-CM | POA: Diagnosis not present

## 2018-08-22 DIAGNOSIS — Z86711 Personal history of pulmonary embolism: Secondary | ICD-10-CM | POA: Diagnosis not present

## 2018-08-22 DIAGNOSIS — Z5181 Encounter for therapeutic drug level monitoring: Secondary | ICD-10-CM | POA: Diagnosis not present

## 2018-08-22 DIAGNOSIS — S72455D Nondisplaced supracondylar fracture without intracondylar extension of lower end of left femur, subsequent encounter for closed fracture with routine healing: Secondary | ICD-10-CM | POA: Diagnosis not present

## 2018-08-22 DIAGNOSIS — H548 Legal blindness, as defined in USA: Secondary | ICD-10-CM | POA: Diagnosis not present

## 2018-08-22 DIAGNOSIS — Z8719 Personal history of other diseases of the digestive system: Secondary | ICD-10-CM | POA: Diagnosis not present

## 2018-08-22 DIAGNOSIS — J441 Chronic obstructive pulmonary disease with (acute) exacerbation: Secondary | ICD-10-CM | POA: Diagnosis not present

## 2018-08-24 ENCOUNTER — Other Ambulatory Visit: Payer: Self-pay | Admitting: Internal Medicine

## 2018-08-24 DIAGNOSIS — Z79891 Long term (current) use of opiate analgesic: Secondary | ICD-10-CM

## 2018-08-24 MED ORDER — MORPHINE SULFATE ER 30 MG PO TBCR: 30.0000 mg | EXTENDED_RELEASE_TABLET | Freq: Two times a day (BID) | ORAL | 0 refills | Status: DC

## 2018-08-24 MED ORDER — HYDROCODONE-ACETAMINOPHEN 7.5-325 MG PO TABS: 1.0000 | ORAL_TABLET | Freq: Four times a day (QID) | ORAL | 0 refills | Status: DC | PRN

## 2018-08-24 NOTE — Telephone Encounter (Signed)
Refill Request  HYDROcodone-acetaminophen (NORCO) 7.5-325 MG tablet  morphine (MS CONTIN) 30 MG 12 hr tablet  [id number] - [place], [place] - [address] AT [place]

## 2018-08-25 DIAGNOSIS — W19XXXD Unspecified fall, subsequent encounter: Secondary | ICD-10-CM | POA: Diagnosis not present

## 2018-08-25 DIAGNOSIS — Z8719 Personal history of other diseases of the digestive system: Secondary | ICD-10-CM | POA: Diagnosis not present

## 2018-08-25 DIAGNOSIS — H548 Legal blindness, as defined in USA: Secondary | ICD-10-CM | POA: Diagnosis not present

## 2018-08-25 DIAGNOSIS — Z933 Colostomy status: Secondary | ICD-10-CM | POA: Diagnosis not present

## 2018-08-25 DIAGNOSIS — M5136 Other intervertebral disc degeneration, lumbar region: Secondary | ICD-10-CM | POA: Diagnosis not present

## 2018-08-25 DIAGNOSIS — Z86711 Personal history of pulmonary embolism: Secondary | ICD-10-CM | POA: Diagnosis not present

## 2018-08-25 DIAGNOSIS — S72465D Nondisplaced supracondylar fracture with intracondylar extension of lower end of left femur, subsequent encounter for closed fracture with routine healing: Secondary | ICD-10-CM | POA: Diagnosis not present

## 2018-08-25 DIAGNOSIS — R2689 Other abnormalities of gait and mobility: Secondary | ICD-10-CM | POA: Diagnosis not present

## 2018-08-25 DIAGNOSIS — Z5181 Encounter for therapeutic drug level monitoring: Secondary | ICD-10-CM | POA: Diagnosis not present

## 2018-08-25 DIAGNOSIS — J441 Chronic obstructive pulmonary disease with (acute) exacerbation: Secondary | ICD-10-CM | POA: Diagnosis not present

## 2018-08-30 ENCOUNTER — Ambulatory Visit (INDEPENDENT_AMBULATORY_CARE_PROVIDER_SITE_OTHER): Payer: Medicare Other | Admitting: Internal Medicine

## 2018-08-30 ENCOUNTER — Encounter: Payer: Self-pay | Admitting: Internal Medicine

## 2018-08-30 ENCOUNTER — Other Ambulatory Visit: Payer: Self-pay

## 2018-08-30 VITALS — BP 116/54 | HR 113 | Temp 98.8°F | Wt 112.4 lb

## 2018-08-30 DIAGNOSIS — M7989 Other specified soft tissue disorders: Secondary | ICD-10-CM

## 2018-08-30 DIAGNOSIS — J449 Chronic obstructive pulmonary disease, unspecified: Secondary | ICD-10-CM

## 2018-08-30 DIAGNOSIS — G8929 Other chronic pain: Secondary | ICD-10-CM | POA: Diagnosis not present

## 2018-08-30 DIAGNOSIS — Z933 Colostomy status: Secondary | ICD-10-CM

## 2018-08-30 DIAGNOSIS — Z7901 Long term (current) use of anticoagulants: Secondary | ICD-10-CM

## 2018-08-30 DIAGNOSIS — Z79899 Other long term (current) drug therapy: Secondary | ICD-10-CM

## 2018-08-30 DIAGNOSIS — K644 Residual hemorrhoidal skin tags: Secondary | ICD-10-CM

## 2018-08-30 DIAGNOSIS — D509 Iron deficiency anemia, unspecified: Secondary | ICD-10-CM

## 2018-08-30 DIAGNOSIS — M4854XD Collapsed vertebra, not elsewhere classified, thoracic region, subsequent encounter for fracture with routine healing: Secondary | ICD-10-CM | POA: Diagnosis not present

## 2018-08-30 DIAGNOSIS — F329 Major depressive disorder, single episode, unspecified: Secondary | ICD-10-CM

## 2018-08-30 DIAGNOSIS — E440 Moderate protein-calorie malnutrition: Secondary | ICD-10-CM

## 2018-08-30 DIAGNOSIS — F339 Major depressive disorder, recurrent, unspecified: Secondary | ICD-10-CM

## 2018-08-30 DIAGNOSIS — Z681 Body mass index (BMI) 19 or less, adult: Secondary | ICD-10-CM

## 2018-08-30 DIAGNOSIS — M546 Pain in thoracic spine: Secondary | ICD-10-CM

## 2018-08-30 MED ORDER — CYCLOBENZAPRINE HCL 5 MG PO TABS: 5.0000 mg | ORAL_TABLET | Freq: Three times a day (TID) | ORAL | 0 refills | Status: DC | PRN

## 2018-08-30 MED ORDER — BOOST BREEZE PO LIQD: 1.0000 | Freq: Three times a day (TID) | ORAL | 5 refills | Status: DC | PRN

## 2018-08-30 MED ORDER — LIDOCAINE-HYDROCORTISONE ACE 2.8-0.55 % RE GEL: 1.0000 | Freq: Two times a day (BID) | RECTAL | 0 refills | Status: AC

## 2018-08-30 NOTE — Progress Notes (Signed)
CC: Leg swelling  HPI: [name] is a 73 y.o. F w/ PMh of PMH of ischemic colitis w/ permanent colostomy, chronic pain 2/2 thoracic compression fractures, iron deficiency anemia and COPD presenting to the clinic for follow up for lower extremity swelling.  [name] Bell states that she has noticed significant reduction in her right leg size after restarting her furosemide.  She also was able to restart her Eliquis without significant complication she denies any obvious bleeding events, hemoptysis, hematuria, melena.  [name] Bell states that she needs to pay $15 every time she gets transportation and she had difficulty affording transportation to the hospital for lower extremities scans that were ordered during her last visit.  She denies any chest pain, shortness of breath, or palpitations.  She mentions that although her leg size is reducing she does continue to have irritation the frontal surface of her shins.   She has also other multiple complaints this visit including rectal pain, neck pain, feeling sad and depressed.  She states that she would be willing to speak to someone about her depressed mood.  She mentions that initiation of trazodone had helped her really sleep at night.  She denies any homicidal or suicidal ideations.   Past Medical History:  Diagnosis Date  . Abnormal chest x-ray 03/29/2018   [facility] PCXR 2/27& 03/28/2018 R perihilar/upper lobe ill-defined infiltrate suggested on the second film. WBC normal with minimal left shift.  Suggestion of R CPA blunting & possible effusion LLL. Rhonchi & wheezing ; O2 sats < 90%.  Purulent sputum was described to her by hospital staff.Rx for Augmentin,steroids, nebs 3/13 mobile imaging PCXR: RUL infiltrate essentially resolved; new RLL oval infiltra  . Acute sinusitis 06/30/2011  . Anxiety   . Basal cell carcinoma    "left cheek"  . Bleeding ulcer   . Blind in both eyes   . Chronic back pain   . Degenerative disk disease    This is interscapular,  mild compression frx of T12 superior endplate with Schmorl's node. Seen on CT in 2/08  . Depression   . Duodenal ulcer 11/08   With hemorrhage and obstruction  . Hair loss 06/24/2016  . History of blood transfusion    "related to bleeding from intestines and vomiting blood"  . Ischemic colitis ([place]) 07/30/2011   2009 by endoscopy   . Macular degeneration, wet ([place])   . Osteomyelitis, jaw acute 11/08   Started while in the hospital abcess showed GNR . SP debridement by Dr. Lilli Few,  Priscella Mann S Bovis sp 4  weeks of Pen V started on 05/19/07  with additional 2 weeks in 07/04/07  . Pain syndrome, chronic   . Perforated bowel [facility])    surgery july 2013  . Pleurisy with pleural effusion 04/16/2018   See 04/14/2018 SNF notes Lovenox changed to Eliquis because of pleuritic pain, persistent left lower extremity pain, and possible hemoptysis. There was a dramatic elevation of d-dimer with a value of 2320  . Sinus tachycardia 09/11/2015  . Superior mesenteric artery syndrome ([place]) 11/08   sp dilation by Dr. Watt Climes, Pt may require bowel resetion if sx recur    Review of Systems: Review of Systems  Constitutional: Negative for chills, fever and malaise/fatigue.  Eyes: Negative for blurred vision and double vision.  Respiratory: Negative for hemoptysis and shortness of breath.   Cardiovascular: Positive for leg swelling. Negative for chest pain, palpitations and orthopnea.  Gastrointestinal: Negative for abdominal pain, constipation, diarrhea, nausea and vomiting.  Genitourinary:  Negative for hematuria.  Psychiatric/Behavioral: Positive for depression. Negative for hallucinations and suicidal ideas. The patient is nervous/anxious. The patient does not have insomnia.     Physical Exam: Vitals:   08/30/18 1535  BP: (!) 116/54  Pulse: (!) 113  Temp: 98.8 F (37.1 C)  TempSrc: Oral  SpO2: 94%  Weight: 112 lb 6.4 oz (51 kg)    Physical Exam  Constitutional: She is oriented to person, place, and time.   Chronically ill-appearing  Neck: Normal range of motion. Neck supple.  Tight trapezius muscle on R side  Cardiovascular: Normal rate, regular rhythm, normal heart sounds and intact distal pulses.  No murmur heard. Respiratory: Effort normal and breath sounds normal. She has no wheezes. She has no rales.  GI: Soft.  Ostomy bag in place and functioning without evidence of bleed  Genitourinary:    Genitourinary Comments: External hemorrhoids noted on rectal exam   Musculoskeletal: Normal range of motion.        General: Edema (2+ pitting edema up to mid shin (Right leg slightly larger than left)) present.  Neurological: She is alert and oriented to person, place, and time.     Assessment & Plan:   Back pain, chronic Continuing to endorse chronic back pain associated with known compression fracture. Currently not due for refill but states she has been having muscular pain around her compression fracture site recently. Discussed importance of not violating her pain contract and noting that exacerbation appearing to be due to worsening muscular pain. Discussed plan for short course of muscle relaxant but importance of avoiding over-use due to higher fall risk. [name] Bell expressed understanding.  - Flexeril 5mg  PRN #15  Major depression, chronic Presents for continued management of chronic major depression. PHQ score this visit at 14. Continues to endorse depressed mood with low appetite and states feeling burned out from her chronic medical conditions. States trazodone has been helping, especially with sleep but feels not back to baseline. Also mentions feeling exacerbated by the restrictions in place due to pandemic. Agreeable to seeing [name] Bell.   - Referral to [facility] - C/w trazodone 25mg  BID  Malnutrition of moderate degree Requesting paper script for supplemental nutrition pack for lowered cost. Paper script provided  Swelling of lower extremity Presents  with continued complaints of lower extremity swelling. On exam significantly improved compared to prior visit after starting furosemide 40mg  daily. Mentions significant urinary output without light-headedness or dizziness. Current differential for lower extremity edema include DVT, venous insufficiency, New CHF (Echo in 2018 w/o significant findings) or low albuminemia due to poor nutrition. Due to history of recent hip surgery, high risk for DVT and restarted her Eliquis last visit without significant complications. Still awaiting Lower extremity US. Discussed importance of following up with her DVT scan to confirm or rule out possible blood clot and deciding on anticoagulation duration of therapy. [name] Bell expressed understanding.  - F/u Lower extremity US - BMP, Cbc today - C/w Eliquis (if no clot on DVT scan, can discuss stopping anticoagulation) - C/w Furosemide 40mg  daily  External hemorrhoids without complication Presents with complaints of rectal pain. She states she believes she may have a bed sore because she is sedentary at home. She does not defecate food due to her colostomy but does mention massing mucus through her rectum and states it is painful when defecating. She states she does not look at her toilet paper when cleaning up and is unsure if she saw blood. On physical exam, noted  to have external hemorrhoids and no pressure ulcers.  - Lidocaine-hydrocortisone gel BID for 7 days    Patient discussed with Dr. Evette Doffing   -Gilberto Better, PGY2 [place] [facility] Pager: [phone]

## 2018-08-30 NOTE — Patient Instructions (Addendum)
Thank you for allowing [place] to provide your care today. Today we discussed your neck and rectal pain as well as your leg swelling.  I have ordered cbc and bmp labs for you. I will call if any are abnormal.    Please make sure to get your leg scan done to look for blood clot.  Today we made the following changes to your medications:  You can use hemorrhoid cream twice a day and make sure to wash your hands thoroughly after administration. Take your other home medications as prescribed. Make sure to avoid taking your sedating medications during the day and take it at night prior to bedtime.    Please follow-up in 4 weeks.    Should you have any questions or concerns please call the internal medicine clinic at [phone].     Hemorrhoids Hemorrhoids are swollen veins that may develop:  In the butt (rectum). These are called internal hemorrhoids.  Around the opening of the butt (anus). These are called external hemorrhoids. Hemorrhoids can cause pain, itching, or bleeding. Most of the time, they do not cause serious problems. They usually get better with diet changes, lifestyle changes, and other home treatments. What are the causes? This condition may be caused by:  Having trouble pooping (constipation).  Pushing hard (straining) to poop.  Watery poop (diarrhea).  Pregnancy.  Being very overweight (obese).  Sitting for long periods of time.  Heavy lifting or other activity that causes you to strain.  Anal sex.  Riding a bike for a long period of time. What are the signs or symptoms? Symptoms of this condition include:  Pain.  Itching or soreness in the butt.  Bleeding from the butt.  Leaking poop.  Swelling in the area.  One or more lumps around the opening of your butt. How is this diagnosed? A doctor can often diagnose this condition by looking at the affected area. The doctor may also:  Do an exam that involves feeling the area with a gloved hand (digital  rectal exam).  Examine the area inside your butt using a small tube (anoscope).  Order blood tests. This may be done if you have lost a lot of blood.  Have you get a test that involves looking inside the colon using a flexible tube with a camera on the end (sigmoidoscopy or colonoscopy). How is this treated? This condition can usually be treated at home. Your doctor may tell you to change what you eat, make lifestyle changes, or try home treatments. If these do not help, procedures can be done to remove the hemorrhoids or make them smaller. These may involve:  Placing rubber bands at the base of the hemorrhoids to cut off their blood supply.  Injecting medicine into the hemorrhoids to shrink them.  Shining a type of light energy onto the hemorrhoids to cause them to fall off.  Doing surgery to remove the hemorrhoids or cut off their blood supply. Follow these instructions at home: Eating and drinking   Eat foods that have a lot of fiber in them. These include whole grains, beans, nuts, fruits, and vegetables.  Ask your doctor about taking products that have added fiber (fibersupplements).  Reduce the amount of fat in your diet. You can do this by: ? Eating low-fat dairy products. ? Eating less red meat. ? Avoiding processed foods.  Drink enough fluid to keep your pee (urine) pale yellow. Managing pain and swelling   Take a warm-water bath (sitz bath) for 20 minutes  to ease pain. Do this 3-4 times a day. You may do this in a bathtub or using a portable sitz bath that fits over the toilet.  If told, put ice on the painful area. It may be helpful to use ice between your warm baths. ? Put ice in a plastic bag. ? Place a towel between your skin and the bag. ? Leave the ice on for 20 minutes, 2-3 times a day. General instructions  Take over-the-counter and prescription medicines only as told by your doctor. ? Medicated creams and medicines may be used as told.  Exercise often.  Ask your doctor how much and what kind of exercise is best for you.  Go to the bathroom when you have the urge to poop. Do not wait.  Avoid pushing too hard when you poop.  Keep your butt dry and clean. Use wet toilet paper or moist towelettes after pooping.  Do not sit on the toilet for a long time.  Keep all follow-up visits as told by your doctor. This is important. Contact a doctor if you:  Have pain and swelling that do not get better with treatment or medicine.  Have trouble pooping.  Cannot poop.  Have pain or swelling outside the area of the hemorrhoids. Get help right away if you have:  Bleeding that will not stop. Summary  Hemorrhoids are swollen veins in the butt or around the opening of the butt.  They can cause pain, itching, or bleeding.  Eat foods that have a lot of fiber in them. These include whole grains, beans, nuts, fruits, and vegetables.  Take a warm-water bath (sitz bath) for 20 minutes to ease pain. Do this 3-4 times a day. This information is not intended to replace advice given to you by your health care provider. Make sure you discuss any questions you have with your health care provider. Document Released: 10/22/2007 Document Revised: 01/20/2018 Document Reviewed: 06/03/2017 Elsevier Patient Education  2020 [employer].

## 2018-08-31 ENCOUNTER — Encounter: Payer: Self-pay | Admitting: Internal Medicine

## 2018-08-31 DIAGNOSIS — H548 Legal blindness, as defined in USA: Secondary | ICD-10-CM | POA: Diagnosis not present

## 2018-08-31 DIAGNOSIS — Z8719 Personal history of other diseases of the digestive system: Secondary | ICD-10-CM | POA: Diagnosis not present

## 2018-08-31 DIAGNOSIS — Z86711 Personal history of pulmonary embolism: Secondary | ICD-10-CM | POA: Diagnosis not present

## 2018-08-31 DIAGNOSIS — S72465D Nondisplaced supracondylar fracture with intracondylar extension of lower end of left femur, subsequent encounter for closed fracture with routine healing: Secondary | ICD-10-CM | POA: Diagnosis not present

## 2018-08-31 DIAGNOSIS — Z5181 Encounter for therapeutic drug level monitoring: Secondary | ICD-10-CM | POA: Diagnosis not present

## 2018-08-31 DIAGNOSIS — J441 Chronic obstructive pulmonary disease with (acute) exacerbation: Secondary | ICD-10-CM | POA: Diagnosis not present

## 2018-08-31 DIAGNOSIS — Z933 Colostomy status: Secondary | ICD-10-CM | POA: Diagnosis not present

## 2018-08-31 DIAGNOSIS — W19XXXD Unspecified fall, subsequent encounter: Secondary | ICD-10-CM | POA: Diagnosis not present

## 2018-08-31 DIAGNOSIS — R2689 Other abnormalities of gait and mobility: Secondary | ICD-10-CM | POA: Diagnosis not present

## 2018-08-31 DIAGNOSIS — K644 Residual hemorrhoidal skin tags: Secondary | ICD-10-CM | POA: Insufficient documentation

## 2018-08-31 DIAGNOSIS — M5136 Other intervertebral disc degeneration, lumbar region: Secondary | ICD-10-CM | POA: Diagnosis not present

## 2018-08-31 LAB — CBC
Hematocrit: 39.3 % (ref 34.0–46.6)
Hemoglobin: 12.1 g/dL (ref 11.1–15.9)
MCH: 30 pg (ref 26.6–33.0)
MCHC: 30.8 g/dL — ABNORMAL LOW (ref 31.5–35.7)
MCV: 97 fL (ref 79–97)
Platelets: 351 10*3/uL (ref 150–450)
RBC: 4.04 x10E6/uL (ref 3.77–5.28)
RDW: 14.5 % (ref 11.7–15.4)
WBC: 8.1 10*3/uL (ref 3.4–10.8)

## 2018-08-31 LAB — BMP8+ANION GAP
Anion Gap: 21 mmol/L — ABNORMAL HIGH (ref 10.0–18.0)
BUN/Creatinine Ratio: 10 — ABNORMAL LOW (ref 12–28)
BUN: 5 mg/dL — ABNORMAL LOW (ref 8–27)
CO2: 22 mmol/L (ref 20–29)
Calcium: 9.1 mg/dL (ref 8.7–10.3)
Chloride: 97 mmol/L (ref 96–106)
Creatinine, Ser: 0.52 mg/dL — ABNORMAL LOW (ref 0.57–1.00)
GFR calc Af Amer: 109 mL/min/{1.73_m2} (ref >59)
GFR calc non Af Amer: 94 mL/min/{1.73_m2} (ref >59)
Glucose: 103 mg/dL — ABNORMAL HIGH (ref 65–99)
Potassium: 3.7 mmol/L (ref 3.5–5.2)
Sodium: 140 mmol/L (ref 134–144)

## 2018-08-31 NOTE — Assessment & Plan Note (Deleted)
Continuing to endorse chronic back pain associated with known compression fracture. Currently not due for refill but states she has been having muscular pain around her compression fracture site recently. Discussed importance of not violating her pain contract and noting that exacerbation appearing to be due to worsening muscular pain. Discussed plan for short course of muscle relaxant but importance of avoiding over-use due to higher fall risk. [name] Bell expressed understanding.  - Flexeril 5mg  PRN #[id number]

## 2018-08-31 NOTE — Assessment & Plan Note (Signed)
Presents for continued management of chronic major depression. PHQ score this visit at 14. Continues to endorse depressed mood with low appetite and states feeling burned out from her chronic medical conditions. States trazodone has been helping, especially with sleep but feels not back to baseline. Also mentions feeling exacerbated by the restrictions in place due to pandemic. Agreeable to seeing Ms.Dessie Coma.   - Referral to [facility] - C/w trazodone 25mg  BID

## 2018-08-31 NOTE — Assessment & Plan Note (Signed)
Requesting paper script for supplemental nutrition pack for lowered cost. Paper script provided

## 2018-08-31 NOTE — Assessment & Plan Note (Addendum)
Presents with continued complaints of lower extremity swelling. On exam significantly improved compared to prior visit after starting furosemide 40mg  daily. Mentions significant urinary output without light-headedness or dizziness. Current differential for lower extremity edema include DVT, venous insufficiency, New CHF (Echo in 2018 w/o significant findings) or low albuminemia due to poor nutrition. Due to history of recent hip surgery, high risk for DVT and restarted her Eliquis last visit without significant complications. Still awaiting Lower extremity US. Discussed importance of following up with her DVT scan to confirm or rule out possible blood clot and deciding on anticoagulation duration of therapy. Ms.Valadez expressed understanding.  - F/u Lower extremity US - BMP, Cbc today - C/w Eliquis (if no clot on DVT scan, can discuss stopping anticoagulation) - C/w Furosemide 40mg  daily

## 2018-08-31 NOTE — Assessment & Plan Note (Signed)
Continuing to endorse chronic back pain associated with known compression fracture. Currently not due for refill but states she has been having muscular pain around her compression fracture site recently. Discussed importance of not violating her pain contract and noting that exacerbation appearing to be due to worsening muscular pain. Discussed plan for short course of muscle relaxant but importance of avoiding over-use due to higher fall risk. [name] Bell expressed understanding.  - Flexeril 5mg  PRN #[id number]

## 2018-08-31 NOTE — Assessment & Plan Note (Signed)
Presents with complaints of rectal pain. She states she believes she may have a bed sore because she is sedentary at home. She does not defecate food due to her colostomy but does mention massing mucus through her rectum and states it is painful when defecating. She states she does not look at her toilet paper when cleaning up and is unsure if she saw blood. On physical exam, noted to have external hemorrhoids and no pressure ulcers.  - Lidocaine-hydrocortisone gel BID for 7 days

## 2018-09-01 ENCOUNTER — Other Ambulatory Visit: Payer: Self-pay | Admitting: Internal Medicine

## 2018-09-01 ENCOUNTER — Telehealth: Payer: Self-pay | Admitting: Licensed Clinical Social Worker

## 2018-09-01 DIAGNOSIS — M7989 Other specified soft tissue disorders: Secondary | ICD-10-CM

## 2018-09-01 DIAGNOSIS — F329 Major depressive disorder, single episode, unspecified: Secondary | ICD-10-CM

## 2018-09-01 NOTE — Telephone Encounter (Signed)
Patient was contacted to establish care due to a referral from her doctor. Pt agreed to services, and will be added to my schedule for 8/19 @ 11:00 phone session.

## 2018-09-01 NOTE — Progress Notes (Signed)
Internal Medicine Clinic Attending ° °Case discussed with Dr. Lee at the time of the visit.  We reviewed the resident’s history and exam and pertinent patient test results.  I agree with the assessment, diagnosis, and plan of care documented in the resident’s note.  °

## 2018-09-02 ENCOUNTER — Other Ambulatory Visit: Payer: Self-pay | Admitting: Internal Medicine

## 2018-09-02 DIAGNOSIS — S7292XS Unspecified fracture of left femur, sequela: Secondary | ICD-10-CM

## 2018-09-05 ENCOUNTER — Telehealth: Payer: Self-pay | Admitting: *Deleted

## 2018-09-05 DIAGNOSIS — Z8719 Personal history of other diseases of the digestive system: Secondary | ICD-10-CM | POA: Diagnosis not present

## 2018-09-05 DIAGNOSIS — H548 Legal blindness, as defined in USA: Secondary | ICD-10-CM | POA: Diagnosis not present

## 2018-09-05 DIAGNOSIS — Z5181 Encounter for therapeutic drug level monitoring: Secondary | ICD-10-CM | POA: Diagnosis not present

## 2018-09-05 DIAGNOSIS — M5136 Other intervertebral disc degeneration, lumbar region: Secondary | ICD-10-CM | POA: Diagnosis not present

## 2018-09-05 DIAGNOSIS — Z933 Colostomy status: Secondary | ICD-10-CM | POA: Diagnosis not present

## 2018-09-05 DIAGNOSIS — K644 Residual hemorrhoidal skin tags: Secondary | ICD-10-CM

## 2018-09-05 DIAGNOSIS — Z86711 Personal history of pulmonary embolism: Secondary | ICD-10-CM | POA: Diagnosis not present

## 2018-09-05 DIAGNOSIS — W19XXXD Unspecified fall, subsequent encounter: Secondary | ICD-10-CM | POA: Diagnosis not present

## 2018-09-05 DIAGNOSIS — R2689 Other abnormalities of gait and mobility: Secondary | ICD-10-CM | POA: Diagnosis not present

## 2018-09-05 DIAGNOSIS — J441 Chronic obstructive pulmonary disease with (acute) exacerbation: Secondary | ICD-10-CM | POA: Diagnosis not present

## 2018-09-05 DIAGNOSIS — S72465D Nondisplaced supracondylar fracture with intracondylar extension of lower end of left femur, subsequent encounter for closed fracture with routine healing: Secondary | ICD-10-CM | POA: Diagnosis not present

## 2018-09-05 NOTE — Telephone Encounter (Signed)
[facility] - stated  [facility]ufact[facility] is out of Lidocaine-Hydrocortisone Ace 2.8-0.55 % GEL I. Please change and send new rx. Thanks

## 2018-09-06 ENCOUNTER — Other Ambulatory Visit: Payer: Self-pay | Admitting: Internal Medicine

## 2018-09-06 DIAGNOSIS — G8929 Other chronic pain: Secondary | ICD-10-CM

## 2018-09-06 DIAGNOSIS — M546 Pain in thoracic spine: Secondary | ICD-10-CM

## 2018-09-06 MED ORDER — HYDROCORTISONE 1 % EX CREA: TOPICAL_CREAM | CUTANEOUS | 0 refills | Status: DC

## 2018-09-06 NOTE — Telephone Encounter (Signed)
[facility]

## 2018-09-14 ENCOUNTER — Ambulatory Visit: Payer: Medicare Other | Admitting: Licensed Clinical Social Worker

## 2018-09-14 DIAGNOSIS — Z8719 Personal history of other diseases of the digestive system: Secondary | ICD-10-CM | POA: Diagnosis not present

## 2018-09-14 DIAGNOSIS — M5136 Other intervertebral disc degeneration, lumbar region: Secondary | ICD-10-CM | POA: Diagnosis not present

## 2018-09-14 DIAGNOSIS — S72465D Nondisplaced supracondylar fracture with intracondylar extension of lower end of left femur, subsequent encounter for closed fracture with routine healing: Secondary | ICD-10-CM | POA: Diagnosis not present

## 2018-09-14 DIAGNOSIS — Z933 Colostomy status: Secondary | ICD-10-CM | POA: Diagnosis not present

## 2018-09-14 DIAGNOSIS — W19XXXD Unspecified fall, subsequent encounter: Secondary | ICD-10-CM | POA: Diagnosis not present

## 2018-09-14 DIAGNOSIS — Z5181 Encounter for therapeutic drug level monitoring: Secondary | ICD-10-CM | POA: Diagnosis not present

## 2018-09-14 DIAGNOSIS — R2689 Other abnormalities of gait and mobility: Secondary | ICD-10-CM | POA: Diagnosis not present

## 2018-09-14 DIAGNOSIS — J441 Chronic obstructive pulmonary disease with (acute) exacerbation: Secondary | ICD-10-CM | POA: Diagnosis not present

## 2018-09-14 DIAGNOSIS — H548 Legal blindness, as defined in USA: Secondary | ICD-10-CM | POA: Diagnosis not present

## 2018-09-14 DIAGNOSIS — Z86711 Personal history of pulmonary embolism: Secondary | ICD-10-CM | POA: Diagnosis not present

## 2018-09-15 ENCOUNTER — Encounter: Payer: Self-pay | Admitting: Licensed Clinical Social Worker

## 2018-09-15 ENCOUNTER — Ambulatory Visit (INDEPENDENT_AMBULATORY_CARE_PROVIDER_SITE_OTHER): Payer: Medicare Other | Admitting: Licensed Clinical Social Worker

## 2018-09-15 ENCOUNTER — Other Ambulatory Visit: Payer: Self-pay

## 2018-09-15 DIAGNOSIS — F330 Major depressive disorder, recurrent, mild: Secondary | ICD-10-CM

## 2018-09-15 DIAGNOSIS — F4322 Adjustment disorder with anxiety: Secondary | ICD-10-CM
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
## 2018-09-16 ENCOUNTER — Other Ambulatory Visit: Payer: Self-pay

## 2018-09-16 DIAGNOSIS — G8929 Other chronic pain: Secondary | ICD-10-CM

## 2018-09-16 DIAGNOSIS — M546 Pain in thoracic spine: Secondary | ICD-10-CM

## 2018-09-16 NOTE — Telephone Encounter (Signed)
cyclobenzaprine (FLEXERIL) 5 MG tablet   REFILL REQUEST @  [id number] [place], [place] [id number] (Phone) [phone] (Fax)

## 2018-09-17 ENCOUNTER — Other Ambulatory Visit: Payer: Self-pay | Admitting: Internal Medicine

## 2018-09-17 DIAGNOSIS — G8929 Other chronic pain: Secondary | ICD-10-CM

## 2018-09-17 DIAGNOSIS — M546 Pain in thoracic spine: Secondary | ICD-10-CM

## 2018-09-17 DIAGNOSIS — Z933 Colostomy status: Secondary | ICD-10-CM

## 2018-09-19 MED ORDER — CYCLOBENZAPRINE HCL 5 MG PO TABS: 5.0000 mg | ORAL_TABLET | Freq: Every evening | ORAL | 0 refills | Status: DC | PRN

## 2018-09-20 ENCOUNTER — Other Ambulatory Visit: Payer: Self-pay | Admitting: Internal Medicine

## 2018-09-20 DIAGNOSIS — W19XXXD Unspecified fall, subsequent encounter: Secondary | ICD-10-CM | POA: Diagnosis not present

## 2018-09-20 DIAGNOSIS — Z8719 Personal history of other diseases of the digestive system: Secondary | ICD-10-CM | POA: Diagnosis not present

## 2018-09-20 DIAGNOSIS — H548 Legal blindness, as defined in USA: Secondary | ICD-10-CM | POA: Diagnosis not present

## 2018-09-20 DIAGNOSIS — Z5181 Encounter for therapeutic drug level monitoring: Secondary | ICD-10-CM | POA: Diagnosis not present

## 2018-09-20 DIAGNOSIS — M5136 Other intervertebral disc degeneration, lumbar region: Secondary | ICD-10-CM | POA: Diagnosis not present

## 2018-09-20 DIAGNOSIS — R2689 Other abnormalities of gait and mobility: Secondary | ICD-10-CM | POA: Diagnosis not present

## 2018-09-20 DIAGNOSIS — J441 Chronic obstructive pulmonary disease with (acute) exacerbation: Secondary | ICD-10-CM | POA: Diagnosis not present

## 2018-09-20 DIAGNOSIS — Z933 Colostomy status: Secondary | ICD-10-CM | POA: Diagnosis not present

## 2018-09-20 DIAGNOSIS — S72465D Nondisplaced supracondylar fracture with intracondylar extension of lower end of left femur, subsequent encounter for closed fracture with routine healing: Secondary | ICD-10-CM | POA: Diagnosis not present

## 2018-09-20 DIAGNOSIS — Z86711 Personal history of pulmonary embolism: Secondary | ICD-10-CM | POA: Diagnosis not present

## 2018-09-20 DIAGNOSIS — M7989 Other specified soft tissue disorders: Secondary | ICD-10-CM

## 2018-09-20 MED ORDER — ONDANSETRON HCL 4 MG PO TABS: ORAL_TABLET | ORAL | 1 refills | Status: DC

## 2018-09-20 MED ORDER — WHITE PETROLATUM GEL: 1.0000 "application " | 1 refills | Status: DC | PRN

## 2018-09-26 ENCOUNTER — Other Ambulatory Visit: Payer: Self-pay | Admitting: Internal Medicine

## 2018-09-26 DIAGNOSIS — M7989 Other specified soft tissue disorders: Secondary | ICD-10-CM

## 2018-09-26 DIAGNOSIS — Z79891 Long term (current) use of opiate analgesic: Secondary | ICD-10-CM

## 2018-09-26 NOTE — Telephone Encounter (Signed)
Needs refill on morphine (MS CONTIN) 30 MG 12 hr tablet HYDROcodone-acetaminophen (NORCO) 7.5-325 MG tablet  ;pt contact [phone]   Line was disconnect, pls connect pt on which pharmacy

## 2018-09-26 NOTE — Telephone Encounter (Signed)
Last rx written 07/2918. Last OV 08/30/18. Next OV has not been scheduled. UDS 02/08/18.

## 2018-09-27 DIAGNOSIS — S72455D Nondisplaced supracondylar fracture without intracondylar extension of lower end of left femur, subsequent encounter for closed fracture with routine healing: Secondary | ICD-10-CM | POA: Diagnosis not present

## 2018-09-27 MED ORDER — MORPHINE SULFATE ER 30 MG PO TBCR: 30.0000 mg | EXTENDED_RELEASE_TABLET | Freq: Two times a day (BID) | ORAL | 0 refills | Status: DC

## 2018-09-27 MED ORDER — HYDROCODONE-ACETAMINOPHEN 7.5-325 MG PO TABS: 1.0000 | ORAL_TABLET | Freq: Four times a day (QID) | ORAL | 0 refills | Status: DC | PRN

## 2018-10-10 ENCOUNTER — Other Ambulatory Visit: Payer: Self-pay | Admitting: Internal Medicine

## 2018-10-10 DIAGNOSIS — S7292XS Unspecified fracture of left femur, sequela: Secondary | ICD-10-CM

## 2018-10-11 ENCOUNTER — Other Ambulatory Visit: Payer: Self-pay | Admitting: Internal Medicine

## 2018-10-11 DIAGNOSIS — K219 Gastro-esophageal reflux disease without esophagitis: Secondary | ICD-10-CM

## 2018-10-11 DIAGNOSIS — G8929 Other chronic pain: Secondary | ICD-10-CM

## 2018-10-26 ENCOUNTER — Other Ambulatory Visit: Payer: Self-pay | Admitting: Internal Medicine

## 2018-10-26 DIAGNOSIS — Z79891 Long term (current) use of opiate analgesic: Secondary | ICD-10-CM

## 2018-10-26 NOTE — Telephone Encounter (Signed)
Refill Request ° °HYDROcodone-acetaminophen (NORCO) 7.5-325 MG tablet ° °morphine (MS CONTIN) 30 MG 12 hr tablet ° °[id number] [place] [place] - [address] °

## 2018-10-27 ENCOUNTER — Other Ambulatory Visit: Payer: Self-pay | Admitting: Internal Medicine

## 2018-10-27 DIAGNOSIS — G8929 Other chronic pain: Secondary | ICD-10-CM

## 2018-10-27 MED ORDER — HYDROCODONE-ACETAMINOPHEN 7.5-325 MG PO TABS: 1.0000 | ORAL_TABLET | Freq: Four times a day (QID) | ORAL | 0 refills | Status: DC | PRN

## 2018-10-27 MED ORDER — MORPHINE SULFATE ER 30 MG PO TBCR: 30.0000 mg | EXTENDED_RELEASE_TABLET | Freq: Two times a day (BID) | ORAL | 0 refills | Status: DC

## 2018-11-08 ENCOUNTER — Other Ambulatory Visit: Payer: Self-pay

## 2018-11-08 ENCOUNTER — Encounter: Payer: Self-pay | Admitting: Podiatry

## 2018-11-08 ENCOUNTER — Ambulatory Visit (INDEPENDENT_AMBULATORY_CARE_PROVIDER_SITE_OTHER): Payer: Medicare Other | Admitting: Podiatry

## 2018-11-08 DIAGNOSIS — B351 Tinea unguium: Secondary | ICD-10-CM

## 2018-11-08 DIAGNOSIS — M79675 Pain in left toe(s): Secondary | ICD-10-CM

## 2018-11-08 DIAGNOSIS — M79674 Pain in right toe(s): Secondary | ICD-10-CM

## 2018-11-08 NOTE — Progress Notes (Signed)
Complaint:  Visit Type: Patient presents  to my office for  preventative foot care services. Complaint: Patient states" my nails have grown long and thick and become painful to walk and wear shoes"  The patient presents for preventative foot care services.   She presents for mpreventative foot care services.  Podiatric Exam: Vascular: dorsalis pedis and posterior tibial pulses are weakly  palpable bilateral. Capillary return is immediate. Cold feet noted.. Skin turgor WNL  Sensorium: Normal Semmes Weinstein monofilament test. Normal tactile sensation bilaterally. Nail Exam: Pt has thick disfigured discolored nails with subungual debris noted bilateral entire nail hallux through fifth toenails Ulcer Exam: There is no evidence of ulcer or pre-ulcerative changes or infection. Orthopedic Exam: Muscle tone and strength are WNL. No limitations in general ROM. No crepitus or effusions noted. Foot type and digits show no abnormalities. Bony prominences are unremarkable. Skin: No Porokeratosis. No infection or ulcers.    Diagnosis:  Onychomycosis, , Pain in right toe, pain in left toes  Treatment & Plan Procedures and Treatment: Consent by patient was obtained for treatment procedures.   Debridement of mycotic and hypertrophic toenails, 1 through 5 bilateral and clearing of subungual debris. No ulceration, no infection noted.  Return Visit-Office Procedure: Patient instructed to return to the office for a follow up visit 3 months for continued evaluation and treatment.    Gardiner Barefoot DPM

## 2018-11-23 ENCOUNTER — Other Ambulatory Visit: Payer: Self-pay

## 2018-11-23 DIAGNOSIS — Z79891 Long term (current) use of opiate analgesic: Secondary | ICD-10-CM

## 2018-11-23 MED ORDER — HYDROCODONE-ACETAMINOPHEN 7.5-325 MG PO TABS: 1.0000 | ORAL_TABLET | Freq: Four times a day (QID) | ORAL | 0 refills | Status: DC | PRN

## 2018-11-23 MED ORDER — MORPHINE SULFATE ER 30 MG PO TBCR: 30.0000 mg | EXTENDED_RELEASE_TABLET | Freq: Two times a day (BID) | ORAL | 0 refills | Status: DC

## 2018-11-23 NOTE — Telephone Encounter (Signed)
HYDROcodone-acetaminophen (NORCO) 7.5-325 MG tablet,   morphine (MS CONTIN) 30 MG 12 hr tablet   REFILL REQUEST @  [id number] [place] - [place] [id number] (Phone) [phone] (Fax)

## 2018-12-02 ENCOUNTER — Other Ambulatory Visit: Payer: Self-pay | Admitting: Internal Medicine

## 2018-12-13 ENCOUNTER — Ambulatory Visit (INDEPENDENT_AMBULATORY_CARE_PROVIDER_SITE_OTHER): Payer: Medicare Other | Admitting: Internal Medicine

## 2018-12-13 ENCOUNTER — Other Ambulatory Visit: Payer: Self-pay

## 2018-12-13 ENCOUNTER — Encounter: Payer: Self-pay | Admitting: Internal Medicine

## 2018-12-13 VITALS — BP 91/61 | HR 83 | Temp 98.1°F | Ht 63.0 in | Wt 110.2 lb

## 2018-12-13 DIAGNOSIS — M7989 Other specified soft tissue disorders: Secondary | ICD-10-CM

## 2018-12-13 DIAGNOSIS — K559 Vascular disorder of intestine, unspecified: Secondary | ICD-10-CM | POA: Diagnosis not present

## 2018-12-13 DIAGNOSIS — J449 Chronic obstructive pulmonary disease, unspecified: Secondary | ICD-10-CM

## 2018-12-13 DIAGNOSIS — Z79899 Other long term (current) drug therapy: Secondary | ICD-10-CM

## 2018-12-13 DIAGNOSIS — I878 Other specified disorders of veins: Secondary | ICD-10-CM

## 2018-12-13 DIAGNOSIS — G8929 Other chronic pain: Secondary | ICD-10-CM | POA: Diagnosis not present

## 2018-12-13 DIAGNOSIS — Z681 Body mass index (BMI) 19 or less, adult: Secondary | ICD-10-CM

## 2018-12-13 DIAGNOSIS — Z7901 Long term (current) use of anticoagulants: Secondary | ICD-10-CM

## 2018-12-13 DIAGNOSIS — Z79891 Long term (current) use of opiate analgesic: Secondary | ICD-10-CM

## 2018-12-13 DIAGNOSIS — M4854XD Collapsed vertebra, not elsewhere classified, thoracic region, subsequent encounter for fracture with routine healing: Secondary | ICD-10-CM | POA: Diagnosis not present

## 2018-12-13 DIAGNOSIS — Z933 Colostomy status: Secondary | ICD-10-CM

## 2018-12-13 DIAGNOSIS — E440 Moderate protein-calorie malnutrition: Secondary | ICD-10-CM

## 2018-12-13 DIAGNOSIS — D509 Iron deficiency anemia, unspecified: Secondary | ICD-10-CM

## 2018-12-13 DIAGNOSIS — K644 Residual hemorrhoidal skin tags: Secondary | ICD-10-CM

## 2018-12-13 DIAGNOSIS — Z23 Encounter for immunization: Secondary | ICD-10-CM | POA: Diagnosis not present

## 2018-12-13 MED ORDER — CYCLOBENZAPRINE HCL 5 MG PO TABS: 5.0000 mg | ORAL_TABLET | Freq: Three times a day (TID) | ORAL | 0 refills | Status: DC | PRN

## 2018-12-13 MED ORDER — HYDROCORTISONE 1 % EX CREA: TOPICAL_CREAM | CUTANEOUS | 0 refills | Status: DC

## 2018-12-13 NOTE — Patient Instructions (Addendum)
Dear [name],  Thank you for allowing [place] to provide your care today. Today we discussed your legs and chest pain    I have ordered no labs for you. I will call if any are abnormal.    Today we made the following changes to your medications:    Please stop Eliquis  Please follow-up in 3 months.    Should you have any questions or concerns please call the internal medicine clinic at [phone].    Thank you for choosing [facility].   Edema  Edema is when you have too much fluid in your body or under your skin. Edema may make your legs, feet, and ankles swell up. Swelling is also common in looser tissues, like around your eyes. This is a common condition. It gets more common as you get older. There are many possible causes of edema. Eating too much salt (sodium) and being on your feet or sitting for a long time can cause edema in your legs, feet, and ankles. Hot weather may make edema worse. Edema is usually painless. Your skin may look swollen or shiny. Follow these instructions at home:  Keep the swollen body part raised (elevated) above the level of your heart when you are sitting or lying down.  Do not sit still or stand for a long time.  Do not wear tight clothes. Do not wear garters on your upper legs.  Exercise your legs. This can help the swelling go down.  Wear elastic bandages or support stockings as told by your doctor.  Eat a low-salt (low-sodium) diet to reduce fluid as told by your doctor.  Depending on the cause of your swelling, you may need to limit how much fluid you drink (fluid restriction).  Take over-the-counter and prescription medicines only as told by your doctor. Contact a doctor if:  Treatment is not working.  You have heart, liver, or kidney disease and have symptoms of edema.  You have sudden and unexplained weight gain. Get help right away if:  You have shortness of breath or chest pain.  You cannot breathe when you lie  down.  You have pain, redness, or warmth in the swollen areas.  You have heart, liver, or kidney disease and get edema all of a sudden.  You have a fever and your symptoms get worse all of a sudden. Summary  Edema is when you have too much fluid in your body or under your skin.  Edema may make your legs, feet, and ankles swell up. Swelling is also common in looser tissues, like around your eyes.  Raise (elevate) the swollen body part above the level of your heart when you are sitting or lying down.  Follow your doctor's instructions about diet and how much fluid you can drink (fluid restriction). This information is not intended to replace advice given to you by your health care provider. Make sure you discuss any questions you have with your health care provider. Document Released: 07/01/2007 Document Revised: 01/15/2017 Document Reviewed: 01/31/2016 Elsevier Patient Education  2020 [employer].

## 2018-12-14 LAB — BMP8+ANION GAP
Anion Gap: 13 mmol/L (ref 10.0–18.0)
BUN/Creatinine Ratio: 18 (ref 12–28)
BUN: 12 mg/dL (ref 8–27)
CO2: 26 mmol/L (ref 20–29)
Calcium: 8.6 mg/dL — ABNORMAL LOW (ref 8.7–10.3)
Chloride: 99 mmol/L (ref 96–106)
Creatinine, Ser: 0.67 mg/dL (ref 0.57–1.00)
GFR calc Af Amer: 99 mL/min/{1.73_m2} (ref >59)
GFR calc non Af Amer: 86 mL/min/{1.73_m2} (ref >59)
Glucose: 85 mg/dL (ref 65–99)
Potassium: 3.3 mmol/L — ABNORMAL LOW (ref 3.5–5.2)
Sodium: 138 mmol/L (ref 134–144)

## 2018-12-15 ENCOUNTER — Encounter: Payer: Self-pay | Admitting: Internal Medicine

## 2018-12-15 MED ORDER — POTASSIUM CHLORIDE 25 MEQ PO PACK: 1.0000 | PACK | Freq: Two times a day (BID) | ORAL | 0 refills | Status: DC

## 2018-12-15 NOTE — Progress Notes (Signed)
CC: Leg swelling  HPI: [name] is a 73 y.o. F w/ PMH of ischemic colitis s/p colostomy, chronic pain 2/2 thoracic compression fractures, iron deficiency anemia and COPD presenting to clinic for f/u. At her last visit, she was endorsing significant lower extremity swelling with unilateral asymmetry and she was recommended to get lower extremity ultrasound. She did not get it due to difficulty with transportation but she states her swelling has significantly improved and she is not endorsing any dyspnea, orthopnea or weakness. States she continues to take her Eliquis as prescribed. She mentions 1 recent episode of trauma to her chest while opening an apple juice bottle and hitting her chest with her hand which caused a small bruise to appear. No other bleeding episodes. Denies any melena, hemoptysis, hematuria.  Past Medical History:  Diagnosis Date  . Abnormal chest x-ray 03/29/2018   [facility] PCXR 2/27& 03/28/2018 R perihilar/upper lobe ill-defined infiltrate suggested on the second film. WBC normal with minimal left shift.  Suggestion of R CPA blunting & possible effusion LLL. Rhonchi & wheezing ; O2 sats < 90%.  Purulent sputum was described to her by hospital staff.Rx for Augmentin,steroids, nebs 3/13 mobile imaging PCXR: RUL infiltrate essentially resolved; new RLL oval infiltra  . Acute sinusitis 06/30/2011  . Anxiety   . Basal cell carcinoma    "left cheek"  . Bleeding ulcer   . Blind in both eyes   . Chronic back pain   . Degenerative disk disease    This is interscapular, mild compression frx of T12 superior endplate with Schmorl's node. Seen on CT in 2/08  . Depression   . Duodenal ulcer 11/08   With hemorrhage and obstruction  . Hair loss 06/24/2016  . History of blood transfusion    "related to bleeding from intestines and vomiting blood"  . Ischemic colitis ([place]) 07/30/2011   2009 by endoscopy   . Macular degeneration, wet ([place])   . Osteomyelitis, jaw acute 11/08   Started  while in the hospital abcess showed GNR . SP debridement by Dr. Lilli Few,  Priscella Mann S Bovis sp 4  weeks of Pen V started on 05/19/07  with additional 2 weeks in 07/04/07  . Pain syndrome, chronic   . Perforated bowel [facility])    surgery july 2013  . Pleurisy with pleural effusion 04/16/2018   See 04/14/2018 SNF notes Lovenox changed to Eliquis because of pleuritic pain, persistent left lower extremity pain, and possible hemoptysis. There was a dramatic elevation of d-dimer with a value of 2320  . Sinus tachycardia 09/11/2015  . Superior mesenteric artery syndrome ([place]) 11/08   sp dilation by Dr. Watt Climes, Pt may require bowel resetion if sx recur    Review of Systems: Review of Systems  Constitutional: Negative for chills, fever and malaise/fatigue.  Respiratory: Negative for cough, sputum production and shortness of breath.   Cardiovascular: Negative for chest pain and palpitations.  Gastrointestinal: Negative for blood in stool and melena.  Musculoskeletal: Positive for back pain, joint pain and neck pain.  All other systems reviewed and are negative.    Physical Exam: Vitals:   12/13/18 1518  BP: 91/61  Pulse: 83  Temp: 98.1 F (36.7 C)  TempSrc: Oral  Weight: 110 lb 3.2 oz (50 kg)  Height: 5\' 3"  (1.6 m)    Physical Exam  Constitutional: She is oriented to person, place, and time. No distress.  Chronically ill-appearing  HENT:  Mouth/Throat: Oropharynx is clear and moist.  Neck: Normal range  of motion. Neck supple. No JVD present.  Cardiovascular: Normal rate, regular rhythm, normal heart sounds and intact distal pulses.  No murmur heard. Respiratory: Effort normal and breath sounds normal. She has no wheezes. She has no rales.  GI: Soft. Bowel sounds are normal. She exhibits no distension.  Colostomy in place and functioning without leakage  Musculoskeletal: Normal range of motion.        General: No edema (Lower extremity bilaterally with no edema. ).  Neurological: She is alert  and oriented to person, place, and time.  Skin: Skin is warm and dry. Rash ( Bilateral lower extremities w/ resolving chronic venous stasis rashes. Much more improved compared to previous visit) noted.  Small area of ecchymosis on left upper sternum.    Assessment & Plan:   Swelling of lower extremity Presents for f/u visit evaluation for lower extremity welling. Continuing to have significant urinary output. Now with resolved swelling. Due to asymmetry recommended to get Ultrasound which was not followed up. Now resolved. On exam appear euvolemic, symmetrical lower extremity diameter. Lungs sound clear. No JVD. Likely due to venous insufficiency + low oncotic pressure due to hx of malnutrition. Eliquis was started in the past due to DVT prophylaxis but duration past due to stop. With resolution with diuresis, lower extremity swelling likely not due to DVT. Will d/c eliquis at this time.   - BMP today - C/w furosemide daily - D/C eliquis  K low at 3.3 recommended to start potassium chloride supplement   Malnutrition of moderate degree Supplemental nutrition pack script sent in last visit. Apparently co-pay is still too high. Currently buying ensure through online store. Discussed importance of continuing nutritional supplements  Long term (current) use of opiate analgesic [name] Bell is on chronic opioid therapy for chronic pain from compression fracture. As part of the treatment plan, the [place] controlled substance database is checked at least twice yearly and the database results was checked at least twice yearly including 12/13/18 and is appropriate.   The last UDS was on 02/08/18 and results were as expected. Patient needs a yearly UDS.  Pain contract was updated and signed on 02/08/18. Through the pain contract, [name] Bell describes commitment to avoid excessive opioid use and only relying on pain medications to pursue goal of ambulation and physical therapy  The patient is on MS  contin 30mg  BID, #60 & Norco 7.5-325mg  q6hr PRN #90 per 30 days. Adjunctive treatment includes physical therapy, cyclobenzaprine. This regimen allows [name] Bell to function without excessive sedation or side effects. The benefits of continuing opioid therapy outweigh the risks and chronic opioids will be continued. Ongoing education about safe opioid treatment is provided.    - Last filled 11/27/18. Will refill as appropriate when needed - F/u in 3 months   Back pain, chronic Continuing to endorse chronic back pain. Well controlled with flexeril + morphine/norco combination. Participating in physical therapy actively. Requesting refills for exacerbation episodes. Discussed risk of falls and to ambulate with supervision. She lives in [place] and states she can receive support as needed.  - Flexeril refill sent  External hemorrhoids without complication Pt requires refills on medications with associated diagnosis above.  Reviewed disease process and find this medication to be necessary, will not change dose or alter current therapy.    Patient discussed with Dr. Evette Doffing   -Gilberto Better, PGY2 [facility] Pager: [phone]

## 2018-12-15 NOTE — Assessment & Plan Note (Addendum)
Continuing to endorse chronic back pain. Well controlled with flexeril + morphine/norco combination. Participating in physical therapy actively. Requesting refills for exacerbation episodes. Discussed risk of falls and to ambulate with supervision. She lives in [place] and states she can receive support as needed.  - Flexeril refill sent

## 2018-12-15 NOTE — Assessment & Plan Note (Signed)
Pt requires refills on medications with associated diagnosis above.  Reviewed disease process and find this medication to be necessary, will not change dose or alter current therapy. 

## 2018-12-15 NOTE — Assessment & Plan Note (Addendum)
[name] Bell is on chronic opioid therapy for chronic pain from compression fracture. As part of the treatment plan, the [place] controlled substance database is checked at least twice yearly and the database results was checked at least twice yearly including 12/13/18 and is appropriate.   The last UDS was on 02/08/18 and results were as expected. Patient needs a yearly UDS.  Pain contract was updated and signed on 02/08/18. Through the pain contract, [name] Bell describes commitment to avoid excessive opioid use and only relying on pain medications to pursue goal of ambulation and physical therapy  The patient is on MS contin 30mg  BID, #60 & Norco 7.5-325mg  q6hr PRN #90 per 30 days. Adjunctive treatment includes physical therapy, cyclobenzaprine. This regimen allows [name] Bell to function without excessive sedation or side effects. The benefits of continuing opioid therapy outweigh the risks and chronic opioids will be continued. Ongoing education about safe opioid treatment is provided.    - Last filled 11/27/18. Will refill as appropriate when needed - F/u in 3 months

## 2018-12-15 NOTE — Assessment & Plan Note (Addendum)
Presents for f/u visit evaluation for lower extremity welling. Continuing to have significant urinary output. Now with resolved swelling. Due to asymmetry recommended to get Ultrasound which was not followed up. Now resolved. On exam appear euvolemic, symmetrical lower extremity diameter. Lungs sound clear. No JVD. Likely due to venous insufficiency + low oncotic pressure due to hx of malnutrition. Eliquis was started in the past due to DVT prophylaxis but duration past due to stop. With resolution with diuresis, lower extremity swelling likely not due to DVT. Will d/c eliquis at this time.   - BMP today - C/w furosemide daily - D/C eliquis  K low at 3.3 recommended to start potassium chloride supplement

## 2018-12-15 NOTE — Assessment & Plan Note (Signed)
Supplemental nutrition pack script sent in last visit. Apparently co-pay is still too high. Currently buying ensure through online store. Discussed importance of continuing nutritional supplements

## 2018-12-16 NOTE — Progress Notes (Signed)
Internal Medicine Clinic Attending ° °Case discussed with Dr. Lee at the time of the visit.  We reviewed the resident’s history and exam and pertinent patient test results.  I agree with the assessment, diagnosis, and plan of care documented in the resident’s note.  °

## 2018-12-20 ENCOUNTER — Telehealth: Payer: Self-pay | Admitting: *Deleted

## 2018-12-20 ENCOUNTER — Other Ambulatory Visit: Payer: Self-pay | Admitting: Internal Medicine

## 2018-12-20 DIAGNOSIS — Z79891 Long term (current) use of opiate analgesic: Secondary | ICD-10-CM

## 2018-12-20 DIAGNOSIS — M7989 Other specified soft tissue disorders: Secondary | ICD-10-CM

## 2018-12-20 MED ORDER — MORPHINE SULFATE ER 30 MG PO TBCR: 30.0000 mg | EXTENDED_RELEASE_TABLET | Freq: Two times a day (BID) | ORAL | 0 refills | Status: DC

## 2018-12-20 MED ORDER — HYDROCODONE-ACETAMINOPHEN 7.5-325 MG PO TABS: 1.0000 | ORAL_TABLET | Freq: Four times a day (QID) | ORAL | 0 refills | Status: DC | PRN

## 2018-12-20 MED ORDER — POTASSIUM CHLORIDE ER 10 MEQ PO CPCR: 10.0000 meq | ORAL_CAPSULE | Freq: Two times a day (BID) | ORAL | 2 refills | Status: DC

## 2018-12-20 NOTE — Telephone Encounter (Signed)
Fax received from [facility] stating Potassium Chloride 25 mEq pack is no longer manufactured. Please send alternative. Hubbard Hartshorn, BSN, RN-BC

## 2018-12-20 NOTE — Telephone Encounter (Signed)
Alternative have been sent in via refill encounter. Thank you.

## 2018-12-20 NOTE — Telephone Encounter (Signed)
Refill Request  HYDROcodone-acetaminophen (NORCO) 7.5-325 MG tablet  morphine (MS CONTIN) 30 MG 12 hr tablet   [id number] [place], [place] - [address] AT [place]

## 2019-01-03 ENCOUNTER — Other Ambulatory Visit: Payer: Self-pay

## 2019-01-03 NOTE — Patient Outreach (Signed)
[facility]) Care Management  01/03/2019  BRIGET RODRIGUE 10-Oct-1945 [id number]   Medication Adherence call to Mrs. Amanada Peoples [employer] Ver[employer] spoke with patients sister,she explain patient is taking Pravastatin 40 mg once daily,she explain patient has been at the hospital for a month and had medication but, she explain [facility] to [facility] the [facility] and will pick up today. Mrs. Knoblauch is showing past due under [place].    [place] Management Direct Dial [phone]  Fax [phone] [email or web address]

## 2019-01-13 ENCOUNTER — Other Ambulatory Visit: Payer: Self-pay | Admitting: Internal Medicine

## 2019-01-13 DIAGNOSIS — E785 Hyperlipidemia, unspecified: Secondary | ICD-10-CM

## 2019-01-17 ENCOUNTER — Other Ambulatory Visit: Payer: Self-pay | Admitting: Internal Medicine

## 2019-01-17 DIAGNOSIS — Z79891 Long term (current) use of opiate analgesic: Secondary | ICD-10-CM

## 2019-01-17 MED ORDER — MORPHINE SULFATE ER 30 MG PO TBCR: 30.0000 mg | EXTENDED_RELEASE_TABLET | Freq: Two times a day (BID) | ORAL | 0 refills | Status: DC

## 2019-01-17 MED ORDER — HYDROCODONE-ACETAMINOPHEN 7.5-325 MG PO TABS: 1.0000 | ORAL_TABLET | Freq: Four times a day (QID) | ORAL | 0 refills | Status: DC | PRN

## 2019-01-17 NOTE — Telephone Encounter (Signed)
Refill Request  HYDROcodone-acetaminophen (NORCO) 7.5-325 MG tablet  morphine (MS CONTIN) 30 MG 12 hr tablet  [id number] [place], [place] - [address] AT [place]

## 2019-01-25 ENCOUNTER — Other Ambulatory Visit: Payer: Self-pay | Admitting: Internal Medicine

## 2019-01-25 DIAGNOSIS — M546 Pain in thoracic spine: Secondary | ICD-10-CM

## 2019-01-25 DIAGNOSIS — G8929 Other chronic pain: Secondary | ICD-10-CM

## 2019-02-01 ENCOUNTER — Ambulatory Visit (INDEPENDENT_AMBULATORY_CARE_PROVIDER_SITE_OTHER): Payer: Medicare Other | Admitting: Podiatry

## 2019-02-01 ENCOUNTER — Encounter: Payer: Self-pay | Admitting: Podiatry

## 2019-02-01 ENCOUNTER — Other Ambulatory Visit: Payer: Self-pay

## 2019-02-01 DIAGNOSIS — M79675 Pain in left toe(s): Secondary | ICD-10-CM | POA: Diagnosis not present

## 2019-02-01 DIAGNOSIS — M79674 Pain in right toe(s): Secondary | ICD-10-CM | POA: Diagnosis not present

## 2019-02-01 DIAGNOSIS — B351 Tinea unguium: Secondary | ICD-10-CM | POA: Diagnosis not present

## 2019-02-01 NOTE — Progress Notes (Signed)
Complaint:  Visit Type: Patient presents  to my office for  preventative foot care services. Complaint: Patient states" my nails have grown long and thick and become painful to walk and wear shoes"  The patient presents for preventative foot care services.   She presents for mpreventative foot care services.  Podiatric Exam: Vascular: dorsalis pedis and posterior tibial pulses are weakly  palpable bilateral. Capillary return is immediate. Cold feet noted.. Skin turgor WNL  Sensorium: Normal Semmes Weinstein monofilament test. Normal tactile sensation bilaterally. Nail Exam: Pt has thick disfigured discolored nails with subungual debris noted bilateral entire nail hallux through fifth toenails Ulcer Exam: There is no evidence of ulcer or pre-ulcerative changes or infection. Orthopedic Exam: Muscle tone and strength are WNL. No limitations in general ROM. No crepitus or effusions noted. Foot type and digits show no abnormalities. Bony prominences are unremarkable. Skin: No Porokeratosis. No infection or ulcers.  Peeling skin both feet / legs.  Diagnosis:  Onychomycosis, , Pain in right toe, pain in left toes  Treatment & Plan Procedures and Treatment: Consent by patient was obtained for treatment procedures.   Debridement of mycotic and hypertrophic toenails, 1 through 5 bilateral and clearing of subungual debris. No ulceration, no infection noted.  Return Visit-Office Procedure: Patient instructed to return to the office for a follow up visit 3 months for continued evaluation and treatment.    Gardiner Barefoot DPM

## 2019-02-23 ENCOUNTER — Other Ambulatory Visit: Payer: Self-pay | Admitting: Internal Medicine

## 2019-02-23 DIAGNOSIS — Z79891 Long term (current) use of opiate analgesic: Secondary | ICD-10-CM

## 2019-02-23 DIAGNOSIS — F329 Major depressive disorder, single episode, unspecified: Secondary | ICD-10-CM

## 2019-02-23 MED ORDER — HYDROCODONE-ACETAMINOPHEN 7.5-325 MG PO TABS: 1.0000 | ORAL_TABLET | Freq: Four times a day (QID) | ORAL | 0 refills | Status: DC | PRN

## 2019-02-23 NOTE — Telephone Encounter (Signed)
Hydrocodone Last rx written 01/17/19. Last OV 12/13/18. Next OV has not been scheduled. UDS  02/08/18.

## 2019-02-23 NOTE — Telephone Encounter (Signed)
Need refill on HYDROcodone-acetaminophen (NORCO) 7.5-325 MG tablet morphine (MS CONTIN) 30 MG 12 hr tablet  ;pt contact [phone]   [id number] [place], [place] - [address] AT [place]

## 2019-02-27 ENCOUNTER — Other Ambulatory Visit: Payer: Self-pay | Admitting: Internal Medicine

## 2019-02-27 DIAGNOSIS — Z79891 Long term (current) use of opiate analgesic: Secondary | ICD-10-CM

## 2019-02-27 NOTE — Telephone Encounter (Signed)
Pt called in last week for medicine refill;  Morphine was missed [phone]   morphine (MS CONTIN) 30 MG 12 hr tablet  [id number] [place], [place] - [address] AT [place]

## 2019-02-28 MED ORDER — MORPHINE SULFATE ER 30 MG PO TBCR: 30.0000 mg | EXTENDED_RELEASE_TABLET | Freq: Two times a day (BID) | ORAL | 0 refills | Status: DC

## 2019-02-28 NOTE — Telephone Encounter (Signed)
Patient calling again for refill on morphine. Hubbard Hartshorn, BSN, RN-BC

## 2019-02-28 NOTE — Telephone Encounter (Signed)
Patient notified that refill on morphine has been sent and she is very [employer] attorney. Hubbard Hartshorn, BSN, RN-BC

## 2019-02-28 NOTE — Telephone Encounter (Signed)
Pt is still calling regarding medicine, she is been waiting since last week; pls contact [phone]

## 2019-03-03 ENCOUNTER — Other Ambulatory Visit: Payer: Self-pay | Admitting: Internal Medicine

## 2019-03-03 DIAGNOSIS — M546 Pain in thoracic spine: Secondary | ICD-10-CM

## 2019-03-03 DIAGNOSIS — G8929 Other chronic pain: Secondary | ICD-10-CM

## 2019-03-04 ENCOUNTER — Other Ambulatory Visit: Payer: Self-pay | Admitting: Internal Medicine

## 2019-03-04 DIAGNOSIS — Z933 Colostomy status: Secondary | ICD-10-CM

## 2019-03-12 ENCOUNTER — Other Ambulatory Visit: Payer: Self-pay | Admitting: Internal Medicine

## 2019-03-12 DIAGNOSIS — M7989 Other specified soft tissue disorders: Secondary | ICD-10-CM

## 2019-03-15 NOTE — Telephone Encounter (Signed)
March - May PCP appt

## 2019-03-17 ENCOUNTER — Other Ambulatory Visit: Payer: Self-pay | Admitting: Internal Medicine

## 2019-03-17 DIAGNOSIS — M7989 Other specified soft tissue disorders: Secondary | ICD-10-CM

## 2019-03-19 ENCOUNTER — Other Ambulatory Visit: Payer: Self-pay | Admitting: Internal Medicine

## 2019-03-19 DIAGNOSIS — D508 Other iron deficiency anemias: Secondary | ICD-10-CM

## 2019-03-24 ENCOUNTER — Other Ambulatory Visit: Payer: Self-pay | Admitting: Internal Medicine

## 2019-03-24 DIAGNOSIS — Z79891 Long term (current) use of opiate analgesic: Secondary | ICD-10-CM

## 2019-03-24 NOTE — Telephone Encounter (Signed)
Need refill on HYDROcodone-acetaminophen (NORCO) 7.5-325 MG tablet  ;pt contact [phone]    [id number] [place], [place] - [address] AT [place]

## 2019-03-27 NOTE — Telephone Encounter (Signed)
Needs refill on morphine (MS CONTIN) 30 MG 12 hr tablet HYDROcodone-acetaminophen (NORCO) 7.5-325 MG tablet  ;pt contact [phone]   [id number] [place], [place] - [address] AT [place]

## 2019-03-27 NOTE — Telephone Encounter (Signed)
Last rx written -Hydrocodone1/28;Morphine 2/2. Last OV 12/13/18. Next OV 05/30/19. UDS 02/08/18.

## 2019-03-28 ENCOUNTER — Other Ambulatory Visit: Payer: Self-pay | Admitting: Internal Medicine

## 2019-03-28 MED ORDER — HYDROCODONE-ACETAMINOPHEN 7.5-325 MG PO TABS: 1.0000 | ORAL_TABLET | Freq: Four times a day (QID) | ORAL | 0 refills | Status: DC | PRN

## 2019-03-28 MED ORDER — MORPHINE SULFATE ER 30 MG PO TBCR: 30.0000 mg | EXTENDED_RELEASE_TABLET | Freq: Two times a day (BID) | ORAL | 0 refills | Status: DC

## 2019-03-28 NOTE — Telephone Encounter (Signed)
Called pt, informed her the hydrocodone was sent to pharm today, she ask if I was sure 3 times, ask her to call pharmacy to be able to pick up

## 2019-03-28 NOTE — Telephone Encounter (Signed)
Refill Request  HYDROcodone-acetaminophen (NORCO) 7.5-325 MG tablet  [id number] [place], [place] - [address] 

## 2019-04-03 ENCOUNTER — Other Ambulatory Visit: Payer: Self-pay | Admitting: Internal Medicine

## 2019-04-08 ENCOUNTER — Other Ambulatory Visit: Payer: Self-pay | Admitting: Internal Medicine

## 2019-04-08 DIAGNOSIS — G8929 Other chronic pain: Secondary | ICD-10-CM

## 2019-04-08 DIAGNOSIS — M546 Pain in thoracic spine: Secondary | ICD-10-CM

## 2019-04-18 ENCOUNTER — Inpatient Hospital Stay (HOSPITAL_COMMUNITY)
Admission: EM | Admit: 2019-04-18 | Discharge: 2019-04-25 | DRG: 377 | Disposition: A | Discharge status: Home Health | Payer: Medicare Other | Attending: Student in an Organized Health Care Education/Training Program | Admitting: Student in an Organized Health Care Education/Training Program

## 2019-04-18 DIAGNOSIS — Z743 Need for continuous supervision: Secondary | ICD-10-CM | POA: Diagnosis not present

## 2019-04-18 DIAGNOSIS — K2901 Acute gastritis with bleeding: Principal | ICD-10-CM | POA: Diagnosis present

## 2019-04-18 DIAGNOSIS — K644 Residual hemorrhoidal skin tags: Secondary | ICD-10-CM | POA: Diagnosis present

## 2019-04-18 DIAGNOSIS — D508 Other iron deficiency anemias: Secondary | ICD-10-CM | POA: Diagnosis not present

## 2019-04-18 DIAGNOSIS — Z90710 Acquired absence of both cervix and uterus: Secondary | ICD-10-CM

## 2019-04-18 DIAGNOSIS — K921 Melena: Secondary | ICD-10-CM | POA: Diagnosis not present

## 2019-04-18 DIAGNOSIS — D619 Aplastic anemia, unspecified: Secondary | ICD-10-CM | POA: Diagnosis present

## 2019-04-18 DIAGNOSIS — Z79899 Other long term (current) drug therapy: Secondary | ICD-10-CM | POA: Diagnosis not present

## 2019-04-18 DIAGNOSIS — F419 Anxiety disorder, unspecified: Secondary | ICD-10-CM | POA: Diagnosis not present

## 2019-04-18 DIAGNOSIS — K760 Fatty (change of) liver, not elsewhere classified: Secondary | ICD-10-CM | POA: Diagnosis not present

## 2019-04-18 DIAGNOSIS — Z7901 Long term (current) use of anticoagulants: Secondary | ICD-10-CM | POA: Diagnosis not present

## 2019-04-18 DIAGNOSIS — J441 Chronic obstructive pulmonary disease with (acute) exacerbation: Secondary | ICD-10-CM | POA: Diagnosis not present

## 2019-04-18 DIAGNOSIS — M940 Chondrocostal junction syndrome [Tietze]: Secondary | ICD-10-CM | POA: Diagnosis present

## 2019-04-18 DIAGNOSIS — H547 Unspecified visual loss: Secondary | ICD-10-CM | POA: Diagnosis not present

## 2019-04-18 DIAGNOSIS — D62 Acute posthemorrhagic anemia: Secondary | ICD-10-CM | POA: Diagnosis not present

## 2019-04-18 DIAGNOSIS — Z933 Colostomy status: Secondary | ICD-10-CM

## 2019-04-18 DIAGNOSIS — E43 Unspecified severe protein-calorie malnutrition: Secondary | ICD-10-CM | POA: Diagnosis not present

## 2019-04-18 DIAGNOSIS — Z8711 Personal history of peptic ulcer disease: Secondary | ICD-10-CM

## 2019-04-18 DIAGNOSIS — H548 Legal blindness, as defined in USA: Secondary | ICD-10-CM | POA: Diagnosis present

## 2019-04-18 DIAGNOSIS — E860 Dehydration: Secondary | ICD-10-CM

## 2019-04-18 DIAGNOSIS — I959 Hypotension, unspecified: Secondary | ICD-10-CM | POA: Diagnosis not present

## 2019-04-18 DIAGNOSIS — G8929 Other chronic pain: Secondary | ICD-10-CM | POA: Diagnosis not present

## 2019-04-18 DIAGNOSIS — K269 Duodenal ulcer, unspecified as acute or chronic, without hemorrhage or perforation: Secondary | ICD-10-CM | POA: Diagnosis not present

## 2019-04-18 DIAGNOSIS — J90 Pleural effusion, not elsewhere classified: Secondary | ICD-10-CM | POA: Diagnosis not present

## 2019-04-18 DIAGNOSIS — R0902 Hypoxemia: Secondary | ICD-10-CM

## 2019-04-18 DIAGNOSIS — J45909 Unspecified asthma, uncomplicated: Secondary | ICD-10-CM | POA: Diagnosis not present

## 2019-04-18 DIAGNOSIS — K222 Esophageal obstruction: Secondary | ICD-10-CM | POA: Diagnosis not present

## 2019-04-18 DIAGNOSIS — J449 Chronic obstructive pulmonary disease, unspecified: Secondary | ICD-10-CM | POA: Diagnosis not present

## 2019-04-18 DIAGNOSIS — Z87891 Personal history of nicotine dependence: Secondary | ICD-10-CM

## 2019-04-18 DIAGNOSIS — F329 Major depressive disorder, single episode, unspecified: Secondary | ICD-10-CM | POA: Diagnosis not present

## 2019-04-18 DIAGNOSIS — Z85828 Personal history of other malignant neoplasm of skin: Secondary | ICD-10-CM

## 2019-04-18 DIAGNOSIS — Z20822 Contact with and (suspected) exposure to covid-19: Secondary | ICD-10-CM | POA: Diagnosis not present

## 2019-04-18 DIAGNOSIS — E876 Hypokalemia: Secondary | ICD-10-CM | POA: Diagnosis not present

## 2019-04-18 DIAGNOSIS — K559 Vascular disorder of intestine, unspecified: Secondary | ICD-10-CM | POA: Diagnosis not present

## 2019-04-18 DIAGNOSIS — R000 Tachycardia, unspecified: Secondary | ICD-10-CM | POA: Diagnosis not present

## 2019-04-18 DIAGNOSIS — R197 Diarrhea, unspecified: Secondary | ICD-10-CM | POA: Diagnosis present

## 2019-04-18 DIAGNOSIS — D649 Anemia, unspecified: Secondary | ICD-10-CM | POA: Diagnosis present

## 2019-04-18 DIAGNOSIS — R54 Age-related physical debility: Secondary | ICD-10-CM | POA: Diagnosis present

## 2019-04-18 DIAGNOSIS — D509 Iron deficiency anemia, unspecified: Secondary | ICD-10-CM | POA: Diagnosis present

## 2019-04-18 DIAGNOSIS — R109 Unspecified abdominal pain: Secondary | ICD-10-CM | POA: Diagnosis not present

## 2019-04-18 DIAGNOSIS — K551 Chronic vascular disorders of intestine: Secondary | ICD-10-CM | POA: Diagnosis not present

## 2019-04-18 DIAGNOSIS — Z9889 Other specified postprocedural states: Secondary | ICD-10-CM | POA: Diagnosis not present

## 2019-04-18 DIAGNOSIS — R079 Chest pain, unspecified: Secondary | ICD-10-CM | POA: Diagnosis not present

## 2019-04-18 DIAGNOSIS — Z6823 Body mass index (BMI) 23.0-23.9, adult: Secondary | ICD-10-CM

## 2019-04-18 DIAGNOSIS — R0789 Other chest pain: Secondary | ICD-10-CM | POA: Diagnosis not present

## 2019-04-18 DIAGNOSIS — R0600 Dyspnea, unspecified: Secondary | ICD-10-CM

## 2019-04-18 DIAGNOSIS — R42 Dizziness and giddiness: Secondary | ICD-10-CM | POA: Diagnosis not present

## 2019-04-18 HISTORY — DX: Anemia, unspecified: D64.9

## 2019-04-18 NOTE — ED Triage Notes (Signed)
Pt arrives via [facility] from home.   Pt colostomy bag burst at home. Pt endorses dizziness and nausea x1 week with acute episodes today. No syncopal episodes.   Pt states "I think I am dehydrated"   A&ox4

## 2019-04-19 ENCOUNTER — Other Ambulatory Visit: Payer: Self-pay | Admitting: Internal Medicine

## 2019-04-19 ENCOUNTER — Other Ambulatory Visit: Payer: Self-pay

## 2019-04-19 ENCOUNTER — Encounter (HOSPITAL_COMMUNITY): Payer: Self-pay | Admitting: Student in an Organized Health Care Education/Training Program

## 2019-04-19 ENCOUNTER — Emergency Department (HOSPITAL_COMMUNITY): Payer: Medicare Other

## 2019-04-19 DIAGNOSIS — J449 Chronic obstructive pulmonary disease, unspecified: Secondary | ICD-10-CM | POA: Diagnosis not present

## 2019-04-19 DIAGNOSIS — R0602 Shortness of breath: Secondary | ICD-10-CM | POA: Diagnosis not present

## 2019-04-19 DIAGNOSIS — D509 Iron deficiency anemia, unspecified: Secondary | ICD-10-CM | POA: Diagnosis present

## 2019-04-19 DIAGNOSIS — H548 Legal blindness, as defined in USA: Secondary | ICD-10-CM

## 2019-04-19 DIAGNOSIS — J441 Chronic obstructive pulmonary disease with (acute) exacerbation: Secondary | ICD-10-CM | POA: Diagnosis not present

## 2019-04-19 DIAGNOSIS — K222 Esophageal obstruction: Secondary | ICD-10-CM | POA: Diagnosis not present

## 2019-04-19 DIAGNOSIS — K2970 Gastritis, unspecified, without bleeding: Secondary | ICD-10-CM | POA: Diagnosis not present

## 2019-04-19 DIAGNOSIS — Z7901 Long term (current) use of anticoagulants: Secondary | ICD-10-CM | POA: Diagnosis not present

## 2019-04-19 DIAGNOSIS — K219 Gastro-esophageal reflux disease without esophagitis: Secondary | ICD-10-CM

## 2019-04-19 DIAGNOSIS — D649 Anemia, unspecified: Secondary | ICD-10-CM | POA: Diagnosis not present

## 2019-04-19 DIAGNOSIS — Z933 Colostomy status: Secondary | ICD-10-CM

## 2019-04-19 DIAGNOSIS — K269 Duodenal ulcer, unspecified as acute or chronic, without hemorrhage or perforation: Secondary | ICD-10-CM

## 2019-04-19 DIAGNOSIS — K551 Chronic vascular disorders of intestine: Secondary | ICD-10-CM

## 2019-04-19 DIAGNOSIS — R269 Unspecified abnormalities of gait and mobility: Secondary | ICD-10-CM | POA: Diagnosis not present

## 2019-04-19 DIAGNOSIS — E43 Unspecified severe protein-calorie malnutrition: Secondary | ICD-10-CM | POA: Diagnosis not present

## 2019-04-19 DIAGNOSIS — G8929 Other chronic pain: Secondary | ICD-10-CM | POA: Diagnosis present

## 2019-04-19 DIAGNOSIS — K921 Melena: Secondary | ICD-10-CM | POA: Diagnosis present

## 2019-04-19 DIAGNOSIS — Z9889 Other specified postprocedural states: Secondary | ICD-10-CM

## 2019-04-19 DIAGNOSIS — E876 Hypokalemia: Secondary | ICD-10-CM | POA: Diagnosis not present

## 2019-04-19 DIAGNOSIS — Z79899 Other long term (current) drug therapy: Secondary | ICD-10-CM | POA: Diagnosis not present

## 2019-04-19 DIAGNOSIS — Z85828 Personal history of other malignant neoplasm of skin: Secondary | ICD-10-CM | POA: Diagnosis not present

## 2019-04-19 DIAGNOSIS — Z8711 Personal history of peptic ulcer disease: Secondary | ICD-10-CM | POA: Diagnosis not present

## 2019-04-19 DIAGNOSIS — F329 Major depressive disorder, single episode, unspecified: Secondary | ICD-10-CM

## 2019-04-19 DIAGNOSIS — J45909 Unspecified asthma, uncomplicated: Secondary | ICD-10-CM | POA: Diagnosis not present

## 2019-04-19 DIAGNOSIS — K760 Fatty (change of) liver, not elsewhere classified: Secondary | ICD-10-CM | POA: Diagnosis present

## 2019-04-19 DIAGNOSIS — K279 Peptic ulcer, site unspecified, unspecified as acute or chronic, without hemorrhage or perforation: Secondary | ICD-10-CM | POA: Diagnosis not present

## 2019-04-19 DIAGNOSIS — R05 Cough: Secondary | ICD-10-CM | POA: Diagnosis not present

## 2019-04-19 DIAGNOSIS — R54 Age-related physical debility: Secondary | ICD-10-CM | POA: Diagnosis present

## 2019-04-19 DIAGNOSIS — Z90710 Acquired absence of both cervix and uterus: Secondary | ICD-10-CM | POA: Diagnosis not present

## 2019-04-19 DIAGNOSIS — K2900 Acute gastritis without bleeding: Secondary | ICD-10-CM | POA: Diagnosis not present

## 2019-04-19 DIAGNOSIS — K2901 Acute gastritis with bleeding: Secondary | ICD-10-CM | POA: Diagnosis not present

## 2019-04-19 DIAGNOSIS — K644 Residual hemorrhoidal skin tags: Secondary | ICD-10-CM

## 2019-04-19 DIAGNOSIS — K209 Esophagitis, unspecified without bleeding: Secondary | ICD-10-CM | POA: Diagnosis not present

## 2019-04-19 DIAGNOSIS — E860 Dehydration: Secondary | ICD-10-CM | POA: Diagnosis present

## 2019-04-19 DIAGNOSIS — D508 Other iron deficiency anemias: Secondary | ICD-10-CM | POA: Diagnosis not present

## 2019-04-19 DIAGNOSIS — Z886 Allergy status to analgesic agent status: Secondary | ICD-10-CM

## 2019-04-19 DIAGNOSIS — Z888 Allergy status to other drugs, medicaments and biological substances status: Secondary | ICD-10-CM

## 2019-04-19 DIAGNOSIS — D62 Acute posthemorrhagic anemia: Secondary | ICD-10-CM | POA: Diagnosis not present

## 2019-04-19 DIAGNOSIS — H547 Unspecified visual loss: Secondary | ICD-10-CM | POA: Diagnosis not present

## 2019-04-19 DIAGNOSIS — M940 Chondrocostal junction syndrome [Tietze]: Secondary | ICD-10-CM | POA: Diagnosis not present

## 2019-04-19 DIAGNOSIS — F419 Anxiety disorder, unspecified: Secondary | ICD-10-CM

## 2019-04-19 DIAGNOSIS — Z20822 Contact with and (suspected) exposure to covid-19: Secondary | ICD-10-CM | POA: Diagnosis not present

## 2019-04-19 DIAGNOSIS — K228 Other specified diseases of esophagus: Secondary | ICD-10-CM | POA: Diagnosis not present

## 2019-04-19 DIAGNOSIS — R197 Diarrhea, unspecified: Secondary | ICD-10-CM | POA: Diagnosis present

## 2019-04-19 DIAGNOSIS — K259 Gastric ulcer, unspecified as acute or chronic, without hemorrhage or perforation: Secondary | ICD-10-CM | POA: Diagnosis not present

## 2019-04-19 DIAGNOSIS — D619 Aplastic anemia, unspecified: Secondary | ICD-10-CM | POA: Diagnosis present

## 2019-04-19 DIAGNOSIS — Z91040 Latex allergy status: Secondary | ICD-10-CM

## 2019-04-19 DIAGNOSIS — I959 Hypotension, unspecified: Secondary | ICD-10-CM | POA: Diagnosis not present

## 2019-04-19 DIAGNOSIS — R109 Unspecified abdominal pain: Secondary | ICD-10-CM | POA: Diagnosis not present

## 2019-04-19 DIAGNOSIS — Z87891 Personal history of nicotine dependence: Secondary | ICD-10-CM | POA: Diagnosis not present

## 2019-04-19 DIAGNOSIS — J90 Pleural effusion, not elsewhere classified: Secondary | ICD-10-CM | POA: Diagnosis not present

## 2019-04-19 DIAGNOSIS — Z91018 Allergy to other foods: Secondary | ICD-10-CM

## 2019-04-19 LAB — POCT I-STAT EG7
Acid-base deficit: 1 mmol/L (ref 0.0–2.0)
Bicarbonate: 24.1 mmol/L (ref 20.0–28.0)
Calcium, Ion: 1 mmol/L — ABNORMAL LOW (ref 1.15–1.40)
HCT: 19 % — ABNORMAL LOW (ref 36.0–46.0)
Hemoglobin: 6.5 g/dL — CL (ref 12.0–15.0)
O2 Saturation: 96 %
Potassium: 2.3 mmol/L — CL (ref 3.5–5.1)
Sodium: 139 mmol/L (ref 135–145)
TCO2: 25 mmol/L (ref 22–32)
pCO2, Ven: 41.9 mmHg — ABNORMAL LOW (ref 44.0–60.0)
pH, Ven: 7.368 (ref 7.250–7.430)
pO2, Ven: 85 mmHg — ABNORMAL HIGH (ref 32.0–45.0)

## 2019-04-19 LAB — CBC WITH DIFFERENTIAL/PLATELET
Abs Immature Granulocytes: 0.03 10*3/uL (ref 0.00–0.07)
Basophils Absolute: 0 10*3/uL (ref 0.0–0.1)
Basophils Relative: 0 %
Eosinophils Absolute: 0 10*3/uL (ref 0.0–0.5)
Eosinophils Relative: 0 %
HCT: 15.5 % — ABNORMAL LOW (ref 36.0–46.0)
Hemoglobin: 3.8 g/dL — CL (ref 12.0–15.0)
Immature Granulocytes: 0 %
Lymphocytes Relative: 16 %
Lymphs Abs: 1.2 10*3/uL (ref 0.7–4.0)
MCH: 17.8 pg — ABNORMAL LOW (ref 26.0–34.0)
MCHC: 24.5 g/dL — ABNORMAL LOW (ref 30.0–36.0)
MCV: 72.4 fL — ABNORMAL LOW (ref 80.0–100.0)
Monocytes Absolute: 0.5 10*3/uL (ref 0.1–1.0)
Monocytes Relative: 7 %
Neutro Abs: 5.4 10*3/uL (ref 1.7–7.7)
Neutrophils Relative %: 77 %
Platelets: 365 10*3/uL (ref 150–400)
RBC: 2.14 MIL/uL — ABNORMAL LOW (ref 3.87–5.11)
RDW: 21 % — ABNORMAL HIGH (ref 11.5–15.5)
WBC: 7.1 10*3/uL (ref 4.0–10.5)
nRBC: 0.3 % — ABNORMAL HIGH (ref 0.0–0.2)

## 2019-04-19 LAB — URINALYSIS, ROUTINE W REFLEX MICROSCOPIC
Bilirubin Urine: NEGATIVE
Glucose, UA: NEGATIVE mg/dL
Hgb urine dipstick: NEGATIVE
Ketones, ur: NEGATIVE mg/dL
Leukocytes,Ua: NEGATIVE
Nitrite: NEGATIVE
Protein, ur: NEGATIVE mg/dL
Specific Gravity, Urine: 1.011 (ref 1.005–1.030)
pH: 5 (ref 5.0–8.0)

## 2019-04-19 LAB — CBC
HCT: 20.7 % — ABNORMAL LOW (ref 36.0–46.0)
HCT: 30.7 % — ABNORMAL LOW (ref 36.0–46.0)
Hemoglobin: 5.7 g/dL — CL (ref 12.0–15.0)
Hemoglobin: 9.4 g/dL — ABNORMAL LOW (ref 12.0–15.0)
MCH: 20.8 pg — ABNORMAL LOW (ref 26.0–34.0)
MCH: 24.2 pg — ABNORMAL LOW (ref 26.0–34.0)
MCHC: 27.5 g/dL — ABNORMAL LOW (ref 30.0–36.0)
MCHC: 30.6 g/dL (ref 30.0–36.0)
MCV: 75.5 fL — ABNORMAL LOW (ref 80.0–100.0)
MCV: 78.9 fL — ABNORMAL LOW (ref 80.0–100.0)
Platelets: 311 10*3/uL (ref 150–400)
Platelets: 241 10*3/uL (ref 150–400)
RBC: 2.74 MIL/uL — ABNORMAL LOW (ref 3.87–5.11)
RBC: 3.89 MIL/uL (ref 3.87–5.11)
RDW: 19.2 % — ABNORMAL HIGH (ref 11.5–15.5)
RDW: 18.6 % — ABNORMAL HIGH (ref 11.5–15.5)
WBC: 5.2 10*3/uL (ref 4.0–10.5)
WBC: 6.9 10*3/uL (ref 4.0–10.5)
nRBC: 0.4 % — ABNORMAL HIGH (ref 0.0–0.2)
nRBC: 0.3 % — ABNORMAL HIGH (ref 0.0–0.2)

## 2019-04-19 LAB — APTT: aPTT: 36 seconds (ref 24–36)

## 2019-04-19 LAB — COMPREHENSIVE METABOLIC PANEL
ALT: 12 U/L (ref 0–44)
AST: 21 U/L (ref 15–41)
Albumin: 2.2 g/dL — ABNORMAL LOW (ref 3.5–5.0)
Alkaline Phosphatase: 96 U/L (ref 38–126)
Anion gap: 15 (ref 5–15)
BUN: 15 mg/dL (ref 8–23)
CO2: 25 mmol/L (ref 22–32)
Calcium: 8.1 mg/dL — ABNORMAL LOW (ref 8.9–10.3)
Chloride: 93 mmol/L — ABNORMAL LOW (ref 98–111)
Creatinine, Ser: 1.01 mg/dL — ABNORMAL HIGH (ref 0.44–1.00)
GFR calc Af Amer: >60 mL/min (ref >60)
GFR calc non Af Amer: 54 mL/min — ABNORMAL LOW (ref >60)
Glucose, Bld: 105 mg/dL — ABNORMAL HIGH (ref 70–99)
Potassium: 2.7 mmol/L — CL (ref 3.5–5.1)
Sodium: 133 mmol/L — ABNORMAL LOW (ref 135–145)
Total Bilirubin: 0.7 mg/dL (ref 0.3–1.2)
Total Protein: 4.7 g/dL — ABNORMAL LOW (ref 6.5–8.1)

## 2019-04-19 LAB — TSH: TSH: 3.067 u[IU]/mL (ref 0.350–4.500)

## 2019-04-19 LAB — FERRITIN: Ferritin: 5 ng/mL — ABNORMAL LOW (ref 11–307)

## 2019-04-19 LAB — BASIC METABOLIC PANEL
Anion gap: 11 (ref 5–15)
BUN: <5 mg/dL — ABNORMAL LOW (ref 8–23)
CO2: 27 mmol/L (ref 22–32)
Calcium: 8.3 mg/dL — ABNORMAL LOW (ref 8.9–10.3)
Chloride: 99 mmol/L (ref 98–111)
Creatinine, Ser: 0.54 mg/dL (ref 0.44–1.00)
GFR calc Af Amer: >60 mL/min (ref >60)
GFR calc non Af Amer: >60 mL/min (ref >60)
Glucose, Bld: 97 mg/dL (ref 70–99)
Potassium: 3.2 mmol/L — ABNORMAL LOW (ref 3.5–5.1)
Sodium: 137 mmol/L (ref 135–145)

## 2019-04-19 LAB — PREPARE RBC (CROSSMATCH)

## 2019-04-19 LAB — RETICULOCYTES
Immature Retic Fract: 28.9 % — ABNORMAL HIGH (ref 2.3–15.9)
RBC.: 2.95 MIL/uL — ABNORMAL LOW (ref 3.87–5.11)
Retic Count, Absolute: 72.6 10*3/uL (ref 19.0–186.0)
Retic Ct Pct: 2.5 % (ref 0.4–3.1)

## 2019-04-19 LAB — LACTIC ACID, PLASMA
Lactic Acid, Venous: 3.8 mmol/L (ref 0.5–1.9)
Lactic Acid, Venous: 1.1 mmol/L (ref 0.5–1.9)

## 2019-04-19 LAB — IRON AND TIBC
Iron: 40 ug/dL (ref 28–170)
Saturation Ratios: 10 % — ABNORMAL LOW (ref 10.4–31.8)
TIBC: 419 ug/dL (ref 250–450)
UIBC: 379 ug/dL

## 2019-04-19 LAB — FOLATE: Folate: 16.7 ng/mL (ref >5.9)

## 2019-04-19 LAB — PROTIME-INR
INR: 1.3 — ABNORMAL HIGH (ref 0.8–1.2)
Prothrombin Time: 15.7 seconds — ABNORMAL HIGH (ref 11.4–15.2)

## 2019-04-19 LAB — TROPONIN I (HIGH SENSITIVITY)
Troponin I (High Sensitivity): 9 ng/L (ref <18)
Troponin I (High Sensitivity): 9 ng/L (ref <18)

## 2019-04-19 LAB — SARS CORONAVIRUS 2 (TAT 6-24 HRS): SARS Coronavirus 2: NEGATIVE

## 2019-04-19 MED ORDER — SODIUM CHLORIDE 0.9 % IV SOLN
10.0000 mL/h | Freq: Once | INTRAVENOUS | Status: AC
  Administered 2019-04-19: 10 mL/h via INTRAVENOUS

## 2019-04-19 MED ORDER — ACETAMINOPHEN 650 MG RE SUPP: 650.0000 mg | Freq: Four times a day (QID) | RECTAL | Status: DC | PRN

## 2019-04-19 MED ORDER — SODIUM CHLORIDE 0.9 % IV SOLN
INTRAVENOUS | Status: DC | PRN
  Administered 2019-04-19: 250 mL via INTRAVENOUS

## 2019-04-19 MED ORDER — LACTATED RINGERS IV BOLUS (SEPSIS)
1000.0000 mL | Freq: Once | INTRAVENOUS | Status: AC
  Administered 2019-04-19: 1000 mL via INTRAVENOUS

## 2019-04-19 MED ORDER — IOHEXOL 300 MG/ML  SOLN
100.0000 mL | Freq: Once | INTRAMUSCULAR | Status: AC | PRN
  Administered 2019-04-19: 100 mL via INTRAVENOUS

## 2019-04-19 MED ORDER — TRAZODONE HCL 50 MG PO TABS
25.0000 mg | ORAL_TABLET | Freq: Two times a day (BID) | ORAL | Status: DC
  Administered 2019-04-19 – 2019-04-25 (×12): 25 mg via ORAL
  Filled 2019-04-19 (×12): qty 1

## 2019-04-19 MED ORDER — LACTATED RINGERS IV SOLN: INTRAVENOUS | Status: AC

## 2019-04-19 MED ORDER — ALBUTEROL SULFATE (2.5 MG/3ML) 0.083% IN NEBU
3.0000 mL | INHALATION_SOLUTION | RESPIRATORY_TRACT | Status: DC | PRN
  Administered 2019-04-20 – 2019-04-23 (×7): 3 mL via RESPIRATORY_TRACT
  Filled 2019-04-19 (×7): qty 3

## 2019-04-19 MED ORDER — METRONIDAZOLE IN NACL 5-0.79 MG/ML-% IV SOLN
500.0000 mg | Freq: Once | INTRAVENOUS | Status: AC
  Administered 2019-04-19: 500 mg via INTRAVENOUS
  Filled 2019-04-19: qty 100

## 2019-04-19 MED ORDER — HYDROCODONE-ACETAMINOPHEN 7.5-325 MG PO TABS
1.0000 | ORAL_TABLET | Freq: Four times a day (QID) | ORAL | Status: DC | PRN
  Administered 2019-04-19 – 2019-04-22 (×10): 1 via ORAL
  Filled 2019-04-19 (×10): qty 1

## 2019-04-19 MED ORDER — FERROUS GLUCONATE 324 (38 FE) MG PO TABS
324.0000 mg | ORAL_TABLET | Freq: Every day | ORAL | Status: DC
  Filled 2019-04-19: qty 1

## 2019-04-19 MED ORDER — FLUTICASONE PROPIONATE 50 MCG/ACT NA SUSP
2.0000 | Freq: Every day | NASAL | Status: DC
  Administered 2019-04-21 – 2019-04-25 (×5): 2 via NASAL
  Filled 2019-04-19: qty 16

## 2019-04-19 MED ORDER — SODIUM CHLORIDE 0.9 % IV SOLN: INTRAVENOUS | Status: DC

## 2019-04-19 MED ORDER — SODIUM CHLORIDE 0.9 % IV SOLN
2.0000 g | Freq: Once | INTRAVENOUS | Status: AC
  Administered 2019-04-19: 2 g via INTRAVENOUS
  Filled 2019-04-19: qty 20

## 2019-04-19 MED ORDER — PRAVASTATIN SODIUM 40 MG PO TABS
40.0000 mg | ORAL_TABLET | Freq: Every day | ORAL | Status: DC
  Administered 2019-04-20 – 2019-04-25 (×6): 40 mg via ORAL
  Filled 2019-04-19 (×6): qty 1

## 2019-04-19 MED ORDER — PANTOPRAZOLE SODIUM 40 MG IV SOLR
40.0000 mg | Freq: Two times a day (BID) | INTRAVENOUS | Status: DC
  Administered 2019-04-19 – 2019-04-24 (×12): 40 mg via INTRAVENOUS
  Filled 2019-04-19 (×12): qty 40

## 2019-04-19 MED ORDER — PROMETHAZINE HCL 25 MG PO TABS
12.5000 mg | ORAL_TABLET | Freq: Four times a day (QID) | ORAL | Status: DC | PRN
  Administered 2019-04-19 – 2019-04-22 (×4): 12.5 mg via ORAL
  Filled 2019-04-19 (×4): qty 1

## 2019-04-19 MED ORDER — METOPROLOL TARTRATE 12.5 MG HALF TABLET
12.5000 mg | ORAL_TABLET | Freq: Two times a day (BID) | ORAL | Status: DC
  Administered 2019-04-19 – 2019-04-20 (×2): 12.5 mg via ORAL
  Filled 2019-04-19 (×3): qty 1

## 2019-04-19 MED ORDER — POTASSIUM CHLORIDE 10 MEQ/100ML IV SOLN
10.0000 meq | INTRAVENOUS | Status: AC
  Administered 2019-04-19 – 2019-04-20 (×6): 10 meq via INTRAVENOUS
  Filled 2019-04-19 (×6): qty 100

## 2019-04-19 MED ORDER — SENNOSIDES-DOCUSATE SODIUM 8.6-50 MG PO TABS: 1.0000 | ORAL_TABLET | Freq: Every evening | ORAL | Status: DC | PRN

## 2019-04-19 MED ORDER — POTASSIUM CHLORIDE 10 MEQ/100ML IV SOLN
10.0000 meq | Freq: Once | INTRAVENOUS | Status: DC
  Administered 2019-04-19: 10 meq via INTRAVENOUS
  Filled 2019-04-19: qty 100

## 2019-04-19 MED ORDER — ACETAMINOPHEN 325 MG PO TABS
650.0000 mg | ORAL_TABLET | Freq: Four times a day (QID) | ORAL | Status: DC | PRN
  Administered 2019-04-19 – 2019-04-23 (×3): 650 mg via ORAL
  Filled 2019-04-19 (×3): qty 2

## 2019-04-19 MED ORDER — POTASSIUM CHLORIDE 20 MEQ/15ML (10%) PO SOLN: 40.0000 meq | Freq: Once | ORAL | Status: DC

## 2019-04-19 MED ORDER — BOOST PLUS PO LIQD
1.0000 | Freq: Three times a day (TID) | ORAL | Status: DC | PRN
  Filled 2019-04-19: qty 237

## 2019-04-19 MED ORDER — LACTATED RINGERS IV BOLUS
1000.0000 mL | Freq: Once | INTRAVENOUS | Status: AC
  Administered 2019-04-19: 1000 mL via INTRAVENOUS

## 2019-04-19 NOTE — Progress Notes (Signed)
Notified bedside nurse of need to draw lactic acid.  

## 2019-04-19 NOTE — ED Notes (Signed)
Notified MD of critical hgb 3.8
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
## 2019-04-19 NOTE — Progress Notes (Signed)
Notified bedside nurse of need to administer antibiotics.  

## 2019-04-19 NOTE — ED Notes (Signed)
Code Sepsis initiated at [place]. Labs collected and boluses administered per order. No order for abx received yet. MD aware.

## 2019-04-19 NOTE — H&P (Addendum)
Date: 04/19/2019               Patient Name:  [name] Bell MRN: [id number]  DOB: [birth year] Age / Sex: 74 y.o., female   PCP: Mosetta Anis, MD         Medical Service: Internal Medicine Teaching Service         Attending Physician: Dr. Evette Doffing, Mallie Mussel, *    First Contact: Marianna Payment DO, Marland Kitchen Pager: [facility] [phone])  Second Contact: Eileen Stanford, MD, Obed Pager: [phone])       After Hours (After 5p/  First Contact Pager: [phone]  weekends / holidays): Second Contact Pager: [phone]   Chief Complaint: Diarrhea  History of Present Illness: [name] Bell is a 74 y/o female with a PMH of depression, ischemic colitis, duodenal ulcer, and anxiety who presents to the [facility] for diarrhea. Patient has been experiencing these symptoms for a week and a half. Patient states that she is legally blind, but that her aid bedside nurse state that the stool is watery and brown. Patient typically changes ostomy bag 3 times per week, but has had intermittent periods of increase ostomy output that she characterizes as "blow outs" that causes her to change her ostomy bag up to 6x a day. She admits to at least 2 episodes of these blow out within the past week. She has had symptoms like this before, but they were not as severe. In addition to her increased output, [name] Bell has noticed increased nausea and stomach pain after eating food. She has begun to avoid eating food due to hear increased nausea and pain. Patient additionally endorses SHOB, heart palpitations, fatigue, and light headedness upon standing, and has started using her walker to ambulate around her house due to her [facility] and fatigue. Patient states that she came in this morning due to getting tunnel vision at her house when she stood up this morning.   ED Course: During her ED course, it was found that she had a K of 2.8, her hgb was found to be 3.8, and her ostomy burst while in the ED. She received 2 unit of blood in the ED.    Meds:   Current Meds  Medication Sig  . albuterol (PROVENTIL) (2.5 MG/3ML) 0.083% nebulizer solution Take 3 mLs (2.5 mg total) by nebulization every 6 (six) hours as needed for wheezing or shortness of breath.  Marland Kitchen albuterol (VENTOLIN HFA) 108 (90 Base) MCG/ACT inhaler TAKE 2 PUFFS BY MOUTH EVERY 6 HOURS AS NEEDED FOR WHEEZE OR SHORTNESS OF BREATH (Patient taking differently: Inhale 2 puffs into the lungs every 6 (six) hours as needed for wheezing or shortness of breath. )  . cyclobenzaprine (FLEXERIL) 5 MG tablet TAKE 1 TABLET BY MOUTH THREE TIMES A DAY AS NEEDED FOR MUSCLE SPASMS (Patient taking differently: Take 5 mg by mouth 3 (three) times daily as needed for muscle spasms. )  . docusate sodium (COLACE) 100 MG capsule Take 1 capsule (100 mg total) by mouth 2 (two) times daily. To prevent constipation while taking pain medication.  . DULoxetine (CYMBALTA) 60 MG capsule Take 60 mg by mouth daily.  Marland Kitchen esomeprazole (NEXIUM) 40 MG capsule TAKE 1 CAPSULE (40 MG TOTAL) BY MOUTH 2 (TWO) TIMES DAILY BEFORE A MEAL.  . ferrous gluconate (FERGON) 324 MG tablet TAKE 1 TABLET BY MOUTH DAILY WITH BREAKFAST (Patient taking differently: Take 324 mg by mouth daily with breakfast. )  . furosemide (LASIX) 40 MG tablet TAKE 1 TABLET BY  MOUTH EVERY DAY (Patient taking differently: Take 40 mg by mouth daily. )  . HYDROcodone-acetaminophen (NORCO) 7.5-325 MG tablet Take 1 tablet by mouth every 6 (six) hours as needed for moderate pain or severe pain.  . metoprolol tartrate (LOPRESSOR) 25 MG tablet Take 12.5 mg by mouth 2 (two) times daily.   Marland Kitchen morphine (MS CONTIN) 30 MG 12 hr tablet Take 1 tablet (30 mg total) by mouth every 12 (twelve) hours. Give one by mouth every twelve hours as needed for pain  . ondansetron (ZOFRAN) 4 MG tablet TAKE 1 TABLET BY MOUTH EVERY 8 HOURS AS NEEDED FOR NAUSEA AND VOMITING (Patient taking differently: Take 4 mg by mouth every 8 (eight) hours as needed for nausea or vomiting. )  . potassium chloride  (MICRO-K) 10 MEQ CR capsule TAKE 1 CAPSULE (10 MEQ TOTAL) BY MOUTH 2 (TWO) TIMES DAILY.  . traZODone (DESYREL) 50 MG tablet TAKE 0.5 TABLETS (25 MG TOTAL) BY MOUTH 2 (TWO) TIMES DAILY.    Social:  Patient lives in [place] She lives alone, but dose have an aid that comes for 3 hours daily.  Social History   Socioeconomic History  . Marital status: Divorced    Spouse name: Not on file  . Number of children: Not on file  . Years of education: Not on file  . Highest education level: Not on file  Occupational History  . Not on file  Tobacco Use  . Smoking status: Former Smoker    Packs/day: 1.50    Years: 59.00    Pack years: 88.50    Types: Cigarettes    Quit date: 05/30/2014    Years since quitting: 4.8  . Smokeless tobacco: Never Used  Substance and Sexual Activity  . Alcohol use: No    Alcohol/week: 0.0 standard drinks  . Drug use: No  . Sexual activity: Never  Other Topics Concern  . Not on file  Social History Narrative   Pt now lives by herself in an ALF [facility]) where she can come and go as she pleases.  A good friend named Truman Hayward still looks out for her daily.    Social Determinants of Health   Financial Resource Strain:   . Difficulty of Paying Living Expenses:   Food Insecurity:   . Worried About Charity fundraiser in the Last Year:   . Arboriculturist in the Last Year:   Transportation Needs:   . Film/video editor (Medical):   Marland Kitchen Lack of Transportation (Non-Medical):   Physical Activity:   . Days of Exercise per Week:   . Minutes of Exercise per Session:   Stress:   . Feeling of Stress :   Social Connections:   . Frequency of Communication with Friends and Family:   . Frequency of Social Gatherings with Friends and Family:   . Attends Religious Services:   . Active Member of Clubs or Organizations:   . Attends Archivist Meetings:   Marland Kitchen Marital Status:   Intimate Partner Violence:   . Fear of Current or Ex-Partner:   . Emotionally  Abused:   Marland Kitchen Physically Abused:   . Sexually Abused:    Family History:  Family History  Problem Relation Age of Onset  . Cancer Mother        Lung  . Heart disease Father   . Diabetes Father   . Diabetes Sister    Allergies: Allergies as of 04/18/2019 - Review Complete 02/01/2019  Allergen Reaction  Noted  . Strawberry (diagnostic) Rash 03/25/2018  . Duloxetine Nausea And Vomiting 08/09/2018  . Ibuprofen Other (See Comments)   . Nsaids Other (See Comments)   . Latex Itching 07/29/2011   Past Medical History:  Diagnosis Date  . Abnormal chest x-ray 03/29/2018   [facility] PCXR 2/27& 03/28/2018 R perihilar/upper lobe ill-defined infiltrate suggested on the second film. WBC normal with minimal left shift.  Suggestion of R CPA blunting & possible effusion LLL. Rhonchi & wheezing ; O2 sats < 90%.  Purulent sputum was described to her by hospital staff.Rx for Augmentin,steroids, nebs 3/13 mobile imaging PCXR: RUL infiltrate essentially resolved; new RLL oval infiltra  . Acute sinusitis 06/30/2011  . Anxiety   . Basal cell carcinoma    "left cheek"  . Bleeding ulcer   . Blind in both eyes   . Chronic back pain   . Degenerative disk disease    This is interscapular, mild compression frx of T12 superior endplate with Schmorl's node. Seen on CT in 2/08  . Depression   . Duodenal ulcer 11/08   With hemorrhage and obstruction  . Hair loss 06/24/2016  . History of blood transfusion    "related to bleeding from intestines and vomiting blood"  . Ischemic colitis ([place]) 07/30/2011   2009 by endoscopy   . Macular degeneration, wet ([place])   . Osteomyelitis, jaw acute 11/08   Started while in the hospital abcess showed GNR . SP debridement by Dr. Lilli Few,  Priscella Mann S Bovis sp 4  weeks of Pen V started on 05/19/07  with additional 2 weeks in 07/04/07  . Pain syndrome, chronic   . Perforated bowel [facility])    surgery july 2013  . Pleurisy with pleural effusion 04/16/2018   See 04/14/2018 SNF notes Lovenox changed  to Eliquis because of pleuritic pain, persistent left lower extremity pain, and possible hemoptysis. There was a dramatic elevation of d-dimer with a value of 2320  . Sinus tachycardia 09/11/2015  . Superior mesenteric artery syndrome ([place]) 11/08   sp dilation by Dr. Watt Climes, Pt may require bowel resetion if sx recur    Review of Systems: A complete ROS was negative except as per HPI.   Physical Exam: Blood pressure (!) 101/43, pulse 92, temperature 97.7 F (36.5 C), temperature source Oral, resp. rate 13, height 5\' 3"  (1.6 m), weight 45.4 kg, last menstrual period 08/25/1980, SpO2 98 %.  Constitutional:      General: She is in acute distress.     Appearance: She is not ill-appearing, toxic-appearing or diaphoretic.     Comments: Patient lying in bed, occasional moaning. Able to answer questions.   HENT:     Head: Normocephalic and atraumatic.  Eyes:     Conjunctiva/sclera: Conjunctivae normal.  Cardiovascular:     Rate and Rhythm: Normal rate and regular rhythm.     Pulses: Normal pulses.     Heart sounds: Normal heart sounds. No murmur. No friction rub. No gallop.   Pulmonary:     Effort: Pulmonary effort is normal.     Breath sounds: Wheezing present.     Comments: Diffuse wheezes appreciated in both lobes bilaterally.  Abdominal:     General: Bowel sounds are normal.     Tenderness: There is abdominal tenderness. There is no guarding.     Comments: Ostomy bag intact with no signs of erosions, erythema, or purulent drainage.    Tenderness in the RLQ  Genitourinary:    Comments: Rectal: External hemorrhoid appreciated on  inspection, no fissures noted. Sphincter tone intact, glove appeared clean on withdrawal.   Musculoskeletal:        General: No swelling or tenderness.     Right lower leg: No edema.     Left lower leg: No edema.  Skin:    General: Skin is dry.     Findings: No erythema or rash.     Comments: Skin changes appreciated in the lower extremities bilaterally,  Skin appears dry and flaking.   Neurological:     Mental Status: She is alert and oriented to person, place, and time.   Labs: CBC    Component Value Date/Time   WBC 5.2 04/19/2019 0616   RBC 2.95 (L) 04/19/2019 0730   RBC 2.74 (L) 04/19/2019 0616   HGB 6.5 (LL) 04/19/2019 0622   HGB 12.1 08/30/2018 1640   HCT 19.0 (L) 04/19/2019 0622   HCT 39.3 08/30/2018 1640   PLT 311 04/19/2019 0616   PLT 351 08/30/2018 1640   MCV 75.5 (L) 04/19/2019 0616   MCV 97 08/30/2018 1640   MCH 20.8 (L) 04/19/2019 0616   MCHC 27.5 (L) 04/19/2019 0616   RDW 19.2 (H) 04/19/2019 0616   RDW 14.5 08/30/2018 1640   LYMPHSABS 1.2 04/19/2019 0040   LYMPHSABS 2.8 07/20/2016 1625   MONOABS 0.5 04/19/2019 0040   EOSABS 0.0 04/19/2019 0040   EOSABS 0.0 07/20/2016 1625   BASOSABS 0.0 04/19/2019 0040   BASOSABS 0.0 07/20/2016 1625     CMP     Component Value Date/Time   NA 139 04/19/2019 0622   NA 138 12/13/2018 1559   K 2.3 (LL) 04/19/2019 0622   CL 93 (L) 04/19/2019 0040   CO2 25 04/19/2019 0040   GLUCOSE 105 (H) 04/19/2019 0040   BUN 15 04/19/2019 0040   BUN 12 12/13/2018 1559   CREATININE 1.01 (H) 04/19/2019 0040   CREATININE 0.69 10/11/2013 1700   CALCIUM 8.1 (L) 04/19/2019 0040   PROT 4.7 (L) 04/19/2019 0040   ALBUMIN 2.2 (L) 04/19/2019 0040   AST 21 04/19/2019 0040   ALT 12 04/19/2019 0040   ALKPHOS 96 04/19/2019 0040   BILITOT 0.7 04/19/2019 0040   GFRNONAA 54 (L) 04/19/2019 0040   GFRNONAA 88 10/11/2013 1700   GFRAA >60 04/19/2019 0040   GFRAA >89 10/11/2013 1700    Imaging:  Ct Abdomen, Pelvis w contrast: Herniated large and small bowel loops through the left lower quadrant ostomy defect. No evidence of bowel obstruction. Moderate bilateral pleural effusions. Compressive atelectasis in the lower lobes. Cholelithiasis.  XR Chest: Small left pleural effusion with left base atelectasis or infiltrate.  EKG: personally reviewed my interpretation is in Sinus Rhythm   Assessment &  Plan by Problem: Principal Problem:   Symptomatic anemia Active Problems:   Colostomy present ([place])   Hypokalemia   Frailty   [name] Bell is a 74 y.o. with pertinent PMH of depression, ischemic colitis, duodenal ulcer, hepatic steatosis, and anxiety who presented with  Dizziness and fatigue in the setting of increased stool output from her ostomy and admit for symptomatic anemia on hospital day 0  #Symptomatic Anemia: #Microcytic hypoproliferative anemia: Patient presented to [facility] ED with a Hgb of 3.8 and clinical s/sx of anemia. This is a new microcytic anemia for her with her last Hgb level 78 months ago at 12.1. Patient states that she is legally blind and has not been able to inspect her output for blood. Rectal exam on admission was negative for frank blood.  Hemoccult ordered in the ED but patient has not had any stool output yet.  Patient was transfused 2 units PRBCs with posttransfusion CBC pending.  Patient denies NSAID use, ETOH, recent corticosteroids, or history of inflammatory bowel disease. Patient does have a significant GI history including upper GI bleed secondary to duodenal ulcer in the setting of aspirin use in 2008,  ischemic colitis 2/2 to SMA syndrome on 10/2007 and colon perforation secondary to diverticulitis with colectomy in 2013. Her most recent colonoscopy was on 05/2017 that was WNL.  Considering the patient's significant past medical history, etiology of her symptomatic anemia is likely multifactorial.  Appreciate GIs assistance with delineating any reversible GI causes for her anemia. -Continue IV PPI -Patient will need IV iron during this hospitalization. -GI plan for upper endoscopy tomorrow.  We will keep her n.p.o. at midnight. -Appreciate GI recommendations.  #Colostomy with high-output: #Hypokalemia: Patient's hypokalemia likely secondary to high output from her ostomy.  She is not currently having any stool output but we will continue to monitor her  electrolytes and replete as needed.  There does not appear to be any infectious etiology for her increased stool output.  We will discontinue antibiotics at this time. -Discontinue Flagyl and ceftriaxone -Continue IV fluid resuscitation. -Stool osmolality pending -TSH within normal limits  #Asthma: We will continue the patient's home medications for her asthma -Proventil 30 mL every 4 hours as needed   Diet: Heart Healthy VTE: SCDs IVF: LR,75cc/hr Code: Full  Prior to Admission Living Arrangement: Home, living alone with help from an aid Anticipated Discharge Location: pending Barriers to Discharge: Minimal assistance at home with increased functional disabilities.  Dispo: Admit patient to Inpatient with expected length of stay greater than 2 midnights.  Signed: Marianna Payment, MD 04/19/2019, 12:13 PM  Pager: [phone]

## 2019-04-19 NOTE — ED Notes (Signed)
POC occult blood lab ordered. No stool available for specimen.

## 2019-04-19 NOTE — Progress Notes (Signed)
NEW ADMISSION NOTE New Admission Note:   Arrival Method: stretcher from ED Mental Orientation: alert and oriented x4  Telemetry: box: 21 NSR Assessment: Completed Skin: dry and flaky IV: left upper arm and right forearm  Pain: 2: abdomen  Tubes: [place]wick Safety Measures: Safety Fall Prevention Plan has been given, discussed and signed Admission: Completed [facility] [place] Orientation: Patient has been orientated to the room, unit and staff.  Family: NA  Orders have been reviewed and implemented. Will continue to monitor the patient. Call light has been placed within reach and bed alarm has been activated.   Baldo Ash, RN

## 2019-04-19 NOTE — ED Notes (Signed)
POC occult blood lab: Still no stool sample available for lab.
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
## 2019-04-19 NOTE — Progress Notes (Addendum)
   Subjective: HD#0   Overnight:Pateint admitted overnight  Today, [name] Bell reports that he has been feeling week for 3 or 4 weeks. At one point, she had noticed decreased output from her ostomy with episodes of increased output as well. She denies seeing any blood in her ostomy bag.   Objective:  Vital signs in last 24 hours: Vitals:   04/19/19 0515 04/19/19 0530 04/19/19 0545 04/19/19 0551  BP: (!) 102/47 105/82 (!) 97/46 (!) 108/41  Pulse: 100 99 96 95  Resp: 19 19 20 13   Temp:    98.8 F (37.1 C)  TempSrc:    Oral  SpO2: 100% 100% 100%   Weight:      Height:        Const: In no apparent distress, lying comfortably in bed, conversational HEENT: Atraumatic, normocephalic Resp: CTA BL, no wheezes, crackles, rhonchi CV: RRR, no murmurs, gallop, rub Abd: Bowel sounds present, nondistended, minimally tender abdomen to palpation Ext: No lower extremity edema, skin is warm to touch Neuro: Cranial nerves II to XII grossly intact without focal neurological deficits Skin: Warm, no rashes, ostomy is pink, moist, and clear of rashes or erythema.  Patient summary:  [name] Bell is a 74 y.o. with pertinent PMH of depression, ischemic colitis, duodenal ulcer, hepatic steatosis, and anxiety who presented with  Dizziness and fatigue in the setting of increased stool output from her ostomy and admit for symptomatic anemia on hospital day 0  Assessment/Plan:  Principal Problem:   Symptomatic anemia Active Problems:   Colostomy present ([place])   Hypokalemia   Frailty  #Symptomatic Anemia: #Microcytic hypoproliferative anemia: Patient continues to be symptomatic from her low anemia.  Patient has an iron deficit of roughly 900 mg.  She is already received 2 units of PRBCs which means she will need an additional 400 mg of IV iron during this hospitalization.  Considering her history of upper GI bleeds, GI intend to perform an EGD tomorrow. -N.p.o. at midnight -Posttransfusion H&H  pending -Continue PPI -Appreciate GI distance comanaging this patient.   #Colostomy with high-output: #Hypokalemia: Patient has not had any output since being admitted.  She does complain of mild diffuse abdominal pain.  #Decreased functional status: Patient lives alone in [place].  She does admit to having an aide come to her house roughly 3 hours/day to help with meal preparation.  Considering her history of macular degeneration and is legally blind combined with her worsening functional status secondary to her symptomatic anemia and decreased p.o. intake, patient will likely need further social support in the outpatient setting. -PT/OT consulted -Dietitians consulted for recommendations on nutrition supplementation.  VTE ppx: SCDs CODE STATUS: Full  Dispo: Anticipated discharge in approximately 1 to 2 days day(s).    Marianna Payment, D.O. Date 04/19/2019 Time 2:14 PM [facility] Internal Medicine, PGY-1 Pager: [phone]

## 2019-04-19 NOTE — ED Notes (Signed)
Pt c/o of 10/10 back pain, admitting paged for IV meds as pt is NPO.

## 2019-04-19 NOTE — ED Notes (Signed)
Abx ordered but no second IV. Blood going in first IV. Waiting for IV team.

## 2019-04-19 NOTE — ED Notes (Signed)
Notified MD of Critical K+ 2.7 and Lactic 3.8

## 2019-04-19 NOTE — ED Notes (Signed)
Admitting called RN regarding pain medicine, MD ok w/ oral meds, will change diet order.

## 2019-04-19 NOTE — ED Notes (Signed)
RN took pt. To CT

## 2019-04-19 NOTE — Anesthesia Preprocedure Evaluation (Signed)
Anesthesia Evaluation  Patient identified by MRN, date of birth, ID band Patient awake    Reviewed: Allergy & Precautions, H&P , NPO status , Patient's Chart, lab work & pertinent test results  Airway Mallampati: III  TM Distance: >3 FB Neck ROM: Full    Dental no notable dental hx. (+) Edentulous Upper, Edentulous Lower, Dental Advisory Given   Pulmonary COPD,  COPD inhaler, former smoker,    Pulmonary exam normal breath sounds clear to auscultation       Cardiovascular Exercise Tolerance: Good + Peripheral Vascular Disease   Rhythm:Regular Rate:Normal     Neuro/Psych  Headaches, Anxiety Depression    GI/Hepatic Neg liver ROS, PUD,   Endo/Other  negative endocrine ROS  Renal/GU negative Renal ROS  negative genitourinary   Musculoskeletal  (+) Arthritis , Osteoarthritis,    Abdominal   Peds  Hematology negative hematology ROS (+) Blood dyscrasia, anemia ,   Anesthesia Other Findings   Reproductive/Obstetrics negative OB ROS                             Anesthesia Physical  Anesthesia Plan  ASA: II  Anesthesia Plan: MAC   Post-op Pain Management:    Induction: Intravenous  PONV Risk Score and Plan: 2 and Propofol infusion and Treatment may vary due to age or medical condition  Airway Management Planned: Nasal Cannula  Additional Equipment:   Intra-op Plan:   Post-operative Plan:   Informed Consent: I have reviewed the patients History and Physical, chart, labs and discussed the procedure including the risks, benefits and alternatives for the proposed anesthesia with the patient or authorized representative who has indicated his/her understanding and acceptance.     Dental advisory given  Plan Discussed with: CRNA and Anesthesiologist  Anesthesia Plan Comments:         Anesthesia Quick Evaluation

## 2019-04-19 NOTE — ED Notes (Signed)
Pt. Back from CT.

## 2019-04-19 NOTE — ED Notes (Signed)
IV team at bedside for 2nd IV 
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
## 2019-04-20 ENCOUNTER — Inpatient Hospital Stay (HOSPITAL_COMMUNITY): Payer: Medicare Other | Admitting: Anesthesiology

## 2019-04-20 ENCOUNTER — Inpatient Hospital Stay (HOSPITAL_COMMUNITY): Payer: Medicare Other

## 2019-04-20 ENCOUNTER — Encounter (HOSPITAL_COMMUNITY)
Admission: EM | Disposition: A | Discharge status: Home Health | Payer: Self-pay | Source: Home / Self Care | Attending: Student in an Organized Health Care Education/Training Program

## 2019-04-20 ENCOUNTER — Encounter (HOSPITAL_COMMUNITY): Payer: Self-pay | Admitting: Student in an Organized Health Care Education/Training Program

## 2019-04-20 DIAGNOSIS — K559 Vascular disorder of intestine, unspecified: Secondary | ICD-10-CM

## 2019-04-20 HISTORY — PX: ESOPHAGEAL BRUSHING: SHX6842

## 2019-04-20 HISTORY — PX: ESOPHAGOGASTRODUODENOSCOPY (EGD) WITH PROPOFOL: SHX5813

## 2019-04-20 LAB — CBC
HCT: 34 % — ABNORMAL LOW (ref 36.0–46.0)
Hemoglobin: 10.4 g/dL — ABNORMAL LOW (ref 12.0–15.0)
MCH: 24 pg — ABNORMAL LOW (ref 26.0–34.0)
MCHC: 30.6 g/dL (ref 30.0–36.0)
MCV: 78.3 fL — ABNORMAL LOW (ref 80.0–100.0)
Platelets: 252 10*3/uL (ref 150–400)
RBC: 4.34 MIL/uL (ref 3.87–5.11)
RDW: 18.6 % — ABNORMAL HIGH (ref 11.5–15.5)
WBC: 6.2 10*3/uL (ref 4.0–10.5)
nRBC: 0 % (ref 0.0–0.2)

## 2019-04-20 LAB — BPAM RBC
Blood Product Expiration Date: 202104212359
Blood Product Expiration Date: 202104212359
Blood Product Expiration Date: 202104212359
ISSUE DATE / TIME: 202103240243
ISSUE DATE / TIME: 202103240832
ISSUE DATE / TIME: 202103241157
Unit Type and Rh: 6200
Unit Type and Rh: 6200
Unit Type and Rh: 6200

## 2019-04-20 LAB — COMPREHENSIVE METABOLIC PANEL
ALT: 10 U/L (ref 0–44)
AST: 20 U/L (ref 15–41)
Albumin: 2 g/dL — ABNORMAL LOW (ref 3.5–5.0)
Alkaline Phosphatase: 93 U/L (ref 38–126)
Anion gap: 10 (ref 5–15)
BUN: <5 mg/dL — ABNORMAL LOW (ref 8–23)
CO2: 28 mmol/L (ref 22–32)
Calcium: 8.6 mg/dL — ABNORMAL LOW (ref 8.9–10.3)
Chloride: 99 mmol/L (ref 98–111)
Creatinine, Ser: 0.55 mg/dL (ref 0.44–1.00)
GFR calc Af Amer: >60 mL/min (ref >60)
GFR calc non Af Amer: >60 mL/min (ref >60)
Glucose, Bld: 83 mg/dL (ref 70–99)
Potassium: 4 mmol/L (ref 3.5–5.1)
Sodium: 137 mmol/L (ref 135–145)
Total Bilirubin: 0.9 mg/dL (ref 0.3–1.2)
Total Protein: 4.5 g/dL — ABNORMAL LOW (ref 6.5–8.1)

## 2019-04-20 LAB — TYPE AND SCREEN
ABO/RH(D): A POS
Antibody Screen: NEGATIVE
Unit division: 0
Unit division: 0
Unit division: 0

## 2019-04-20 LAB — GLUCOSE, CAPILLARY: Glucose-Capillary: 85 mg/dL (ref 70–99)

## 2019-04-20 LAB — URINE CULTURE: Culture: 20000 — AB

## 2019-04-20 SURGERY — ESOPHAGOGASTRODUODENOSCOPY (EGD) WITH PROPOFOL
Anesthesia: General

## 2019-04-20 MED ORDER — SODIUM CHLORIDE 0.9 % IV SOLN
510.0000 mg | Freq: Once | INTRAVENOUS | Status: AC
  Administered 2019-04-20: 510 mg via INTRAVENOUS
  Filled 2019-04-20: qty 17

## 2019-04-20 MED ORDER — FUROSEMIDE 10 MG/ML IJ SOLN
INTRAMUSCULAR | Status: AC
  Filled 2019-04-20: qty 4

## 2019-04-20 MED ORDER — FUROSEMIDE 10 MG/ML IJ SOLN
10.0000 mg | Freq: Once | INTRAMUSCULAR | Status: AC
  Administered 2019-04-20: 10 mg via INTRAVENOUS

## 2019-04-20 MED ORDER — CETAPHIL MOISTURIZING EX LOTN
TOPICAL_LOTION | CUTANEOUS | Status: DC | PRN
  Filled 2019-04-20: qty 473

## 2019-04-20 MED ORDER — FENTANYL CITRATE (PF) 100 MCG/2ML IJ SOLN
50.0000 ug | Freq: Once | INTRAMUSCULAR | Status: AC
  Administered 2019-04-20: 50 ug via INTRAVENOUS

## 2019-04-20 MED ORDER — LIDOCAINE 5 % EX PTCH
1.0000 | MEDICATED_PATCH | CUTANEOUS | Status: DC
  Administered 2019-04-20 – 2019-04-25 (×6): 1 via TRANSDERMAL
  Filled 2019-04-20 (×6): qty 1

## 2019-04-20 MED ORDER — LIDOCAINE 2% (20 MG/ML) 5 ML SYRINGE
INTRAMUSCULAR | Status: DC | PRN
  Administered 2019-04-20: 50 mg via INTRAVENOUS

## 2019-04-20 MED ORDER — PROPOFOL 500 MG/50ML IV EMUL
INTRAVENOUS | Status: DC | PRN
  Administered 2019-04-20: 120 ug/kg/min via INTRAVENOUS

## 2019-04-20 MED ORDER — DEXAMETHASONE SODIUM PHOSPHATE 10 MG/ML IJ SOLN
INTRAMUSCULAR | Status: DC | PRN
  Administered 2019-04-20: 4 mg via INTRAVENOUS

## 2019-04-20 MED ORDER — SUCCINYLCHOLINE CHLORIDE 200 MG/10ML IV SOSY
PREFILLED_SYRINGE | INTRAVENOUS | Status: DC | PRN
  Administered 2019-04-20: 80 mg via INTRAVENOUS

## 2019-04-20 MED ORDER — PHENYLEPHRINE 40 MCG/ML (10ML) SYRINGE FOR IV PUSH (FOR BLOOD PRESSURE SUPPORT)
PREFILLED_SYRINGE | INTRAVENOUS | Status: DC | PRN
  Administered 2019-04-20: 160 ug via INTRAVENOUS
  Administered 2019-04-20: 120 ug via INTRAVENOUS

## 2019-04-20 MED ORDER — FENTANYL CITRATE (PF) 100 MCG/2ML IJ SOLN
INTRAMUSCULAR | Status: AC
  Filled 2019-04-20: qty 2

## 2019-04-20 MED ORDER — SODIUM CHLORIDE 0.9 % IV BOLUS
500.0000 mL | Freq: Once | INTRAVENOUS | Status: AC
  Administered 2019-04-20: 500 mL via INTRAVENOUS

## 2019-04-20 MED ORDER — PROPOFOL 10 MG/ML IV BOLUS
INTRAVENOUS | Status: DC | PRN
  Administered 2019-04-20: 100 mg via INTRAVENOUS

## 2019-04-20 MED ORDER — ADULT MULTIVITAMIN W/MINERALS CH
1.0000 | ORAL_TABLET | Freq: Every day | ORAL | Status: DC
  Administered 2019-04-21 – 2019-04-25 (×5): 1 via ORAL
  Filled 2019-04-20 (×6): qty 1

## 2019-04-20 MED ORDER — ONDANSETRON HCL 4 MG/2ML IJ SOLN
INTRAMUSCULAR | Status: DC | PRN
  Administered 2019-04-20: 4 mg via INTRAVENOUS

## 2019-04-20 MED ORDER — ENSURE ENLIVE PO LIQD
237.0000 mL | Freq: Two times a day (BID) | ORAL | Status: DC
  Administered 2019-04-21 – 2019-04-22 (×3): 237 mL via ORAL

## 2019-04-20 MED ORDER — LACTATED RINGERS IV SOLN
INTRAVENOUS | Status: AC | PRN
  Administered 2019-04-20: 20 mL/h via INTRAVENOUS

## 2019-04-20 MED ORDER — POTASSIUM CHLORIDE 10 MEQ/100ML IV SOLN: 10.0000 meq | INTRAVENOUS | Status: DC

## 2019-04-20 MED ORDER — LACTATED RINGERS IV SOLN: INTRAVENOUS | Status: AC

## 2019-04-20 MED ORDER — ALBUTEROL SULFATE HFA 108 (90 BASE) MCG/ACT IN AERS
INHALATION_SPRAY | RESPIRATORY_TRACT | Status: DC | PRN
  Administered 2019-04-20: 2 via RESPIRATORY_TRACT

## 2019-04-20 MED ORDER — HYDROMORPHONE HCL 1 MG/ML IJ SOLN
0.5000 mg | Freq: Two times a day (BID) | INTRAMUSCULAR | Status: DC | PRN
  Administered 2019-04-20 – 2019-04-21 (×3): 0.5 mg via INTRAVENOUS
  Filled 2019-04-20 (×5): qty 1

## 2019-04-20 MED ORDER — IPRATROPIUM-ALBUTEROL 0.5-2.5 (3) MG/3ML IN SOLN
3.0000 mL | RESPIRATORY_TRACT | Status: DC | PRN
  Administered 2019-04-20 – 2019-04-22 (×5): 3 mL via RESPIRATORY_TRACT
  Filled 2019-04-20 (×5): qty 3

## 2019-04-20 MED ORDER — ALBUTEROL SULFATE (2.5 MG/3ML) 0.083% IN NEBU
INHALATION_SOLUTION | RESPIRATORY_TRACT | Status: AC
  Filled 2019-04-20: qty 3

## 2019-04-20 SURGICAL SUPPLY — 15 items

## 2019-04-20 NOTE — Anesthesia Procedure Notes (Signed)
Procedure Name: Intubation Performed by: Ander Wamser H, CRNA Pre-anesthesia Checklist: Patient identified, Emergency Drugs available, Suction available and Patient being monitored Patient Re-evaluated:Patient Re-evaluated prior to induction Oxygen Delivery Method: Circle System Utilized Preoxygenation: Pre-oxygenation with 100% oxygen Induction Type: IV induction and Rapid sequence Laryngoscope Size: Mac and 3 Grade View: Grade I Tube type: Oral Tube size: 7.0 mm Number of attempts: 1 Airway Equipment and Method: Stylet and Oral airway Placement Confirmation: ETT inserted through vocal cords under direct vision,  positive ETCO2 and breath sounds checked- equal and bilateral Secured at: 21 cm Tube secured with: Tape Dental Injury: Teeth and Oropharynx as per pre-operative assessment        

## 2019-04-20 NOTE — Progress Notes (Signed)
OT Cancellation Note  Patient Details Name: [name] Bell MRN: [id number] DOB: 1945/04/12   Cancelled Treatment:    Reason Eval/Treat Not Completed: Patient at procedure or test/ unavailable  [name] 04/20/2019, 10:05 AM  [name], OTR/L Acute Rehabilitation Services Pager: [phone] Office: [phone]

## 2019-04-20 NOTE — Op Note (Addendum)
[facility] Patient Name: [name] Bell Procedure Date : 04/20/2019 MRN: [id number] Attending MD: Lear Ng , MD Date of Birth: September 27, 1945 CSN: [id number] Age: 74 Admit Type: Inpatient Procedure:                Upper GI endoscopy Indications:              Suspected upper gastrointestinal bleeding, Iron                            deficiency anemia Providers:                Lear Ng, MD, Grace Isaac, RN, Lazaro Arms, Technician, Lerry Paterson, CRNA Referring MD:             hospital team Medicines:                Propofol per Anesthesia, Monitored Anesthesia Care Complications:            No immediate complications. Estimated Blood Loss:     Estimated blood loss was minimal. Procedure:                Pre-Anesthesia Assessment:                           - Prior to the procedure, a History and Physical                            was performed, and patient medications and                            allergies were reviewed. The patient's tolerance of                            previous anesthesia was also reviewed. The risks                            and benefits of the procedure and the sedation                            options and risks were discussed with the patient.                            All questions were answered, and informed consent                            was obtained. Prior Anticoagulants: The patient has                            taken Eliquis (apixaban), last dose was 3 days                            prior to procedure. ASA Grade Assessment: III - A  patient with severe systemic disease. After                            reviewing the risks and benefits, the patient was                            deemed in satisfactory condition to undergo the                            procedure.                           After obtaining informed consent, the endoscope was      passed under direct vision. Throughout the                            procedure, the patient's blood pressure, pulse, and                            oxygen saturations were monitored continuously. The                            [id number]) Olympus gastroscope was                            introduced through the mouth, and advanced to the                            second part of duodenum. The upper GI endoscopy was                            performed with difficulty due to enhanced patient                            gag reflex causing intolerance of esophageal                            intubation. Successful completion of the procedure                            was aided by withdrawing and reinserting the scope                            and performing the maneuvers documented (below) in                            this report. The patient tolerated the procedure                            fairly well. Scope In: Scope Out: Findings:      One benign-appearing, intrinsic moderate (circumferential scarring or       stenosis; an endoscope may pass) stenosis was found at the       gastroesophageal junction. The stenosis was traversed.      Patchy,  white plaques were found in the distal esophagus. Cells for       cytology were obtained by brushing. Estimated blood loss was minimal.      The Z-line was found 35 cm from the incisors.      Localized mild inflammation characterized by congestion (edema) and       erythema was found in the gastric antrum.      The cardia and gastric fundus were normal on retroflexion.      The examined duodenum was normal.      One non-bleeding superficial gastric ulcer with pigmented material was       found in the prepyloric region of the stomach. The lesion was 3 mm in       largest dimension. Impression:               - Benign-appearing esophageal stenosis.                           - Esophageal plaques were found, suspicious for                             candidiasis. Cells for cytology obtained.                           - Z-line, 35 cm from the incisors.                           - Acute gastritis.                           - Normal examined duodenum.                           - Non-bleeding gastric ulcer with pigmented                            material. Recommendation:           - Clear liquid diet.                           - Await pathology results.                           - Observe patient's clinical course. Procedure Code(s):        --- Professional ---                           [phone], Esophagogastroduodenoscopy, flexible,                            transoral; diagnostic, including collection of                            specimen(s) by brushing or washing, when performed                            (separate procedure) Diagnosis Code(s):        --- Professional ---  K25.9, Gastric ulcer, unspecified as acute or                            chronic, without hemorrhage or perforation                           D50.9, Iron deficiency anemia, unspecified                           K29.00, Acute gastritis without bleeding                           K22.2, Esophageal obstruction                           K22.9, Disease of esophagus, unspecified CPT copyright 2019 [employer]. All rights reserved. The codes documented in this report are preliminary and upon coder review may  be revised to meet current compliance requirements. Lear Ng, MD 04/20/2019 11:29:08 AM This report has been signed electronically. Number of Addenda: 0

## 2019-04-20 NOTE — Evaluation (Signed)
Occupational Therapy Evaluation Patient Details Name: [name] Bell MRN: [id number] DOB: 22-Dec-1945 Today's Date: 04/20/2019    History of Present Illness Patient is a 74 year old female admitted from home with h/o diarrhea, When admitted patient's HGB was 3.8. Patient is legally blind. PMH includes: colostomy, COPD, hypokalemia.   Clinical Impression   Pt lives alone and has been ambulating with either a cane or walker. She has significant vision impairment due to macular degeneration. Pt has an aide that helps with IADL 3 days a week. Pt with poor activity tolerance, pain and generalized weakness. She requires set up to total assist for ADL. Recommending further rehab in SNF prior to return home. Will follow acutely.    Follow Up Recommendations  SNF;Supervision/Assistance - 24 hour    Equipment Recommendations  Other (comment)(defer to next venue)    Recommendations for Other Services       Precautions / Restrictions Precautions Precautions: Fall Precaution Comments: enteric precautions Restrictions Weight Bearing Restrictions: No      Mobility Bed Mobility Overal bed mobility: Needs Assistance             General bed mobility comments: patient is very weak and needing mod/max assist for bed mobility this day  Transfers                 General transfer comment: Patient declined    Balance                                           ADL either performed or assessed with clinical judgement   ADL Overall ADL's : Needs assistance/impaired Eating/Feeding: Set up;Bed level   Grooming: Minimal assistance;Bed level   Upper Body Bathing: Maximal assistance;Bed level   Lower Body Bathing: Total assistance;Bed level   Upper Body Dressing : Moderate assistance;Bed level   Lower Body Dressing: Total assistance;Bed level       Toileting- Clothing Manipulation and Hygiene: Total assistance;Bed level         General ADL Comments: Pt  unable to tolerate mobility, reports hurting "all over" and not having had any sleep     Vision Baseline Vision/History: Macular Degeneration;Legally blind Patient Visual Report: No change from baseline       Perception     Praxis      Pertinent Vitals/Pain Pain Assessment: Faces Faces Pain Scale: Hurts even more Pain Location: "all over" Pain Descriptors / Indicators: Sore Pain Intervention(s): Patient requesting pain meds-RN notified     Hand Dominance Right   Extremity/Trunk Assessment Upper Extremity Assessment Upper Extremity Assessment: Generalized weakness   Lower Extremity Assessment Lower Extremity Assessment: Generalized weakness       Communication Communication Communication: No difficulties   Cognition Arousal/Alertness: Awake/alert Behavior During Therapy: WFL for tasks assessed/performed Overall Cognitive Status: Within Functional Limits for tasks assessed                                     General Comments       Exercises     Shoulder Instructions      Home Living Family/patient expects to be discharged to:: Private residence Living Arrangements: Alone Available Help at Discharge: Family;Available PRN/intermittently;Personal care attendant Type of Home: Apartment Home Access: Level entry     Home Layout: One level  Bathroom Shower/Tub: Teacher, early years/pre: Standard     Home Equipment: Cane - single point;Bedside commode;Tub bench;Hand held shower head;Walker - 2 wheels   Additional Comments: Has PCA that comes 3x per week      Prior Functioning/Environment Level of Independence: Needs assistance  Gait / Transfers Assistance Needed: walking with walker or cane ADL's / Homemaking Assistance Needed: pt was modified independnent in self care and assisted for housekeeping and errands by her aide            OT Problem List: Decreased strength;Decreased activity tolerance;Pain      OT  Treatment/Interventions: Self-care/ADL training;Therapeutic activities;Patient/family education;Balance training;DME and/or AE instruction;Therapeutic exercise    OT Goals(Current goals can be found in the care plan section) Acute Rehab OT Goals Patient Stated Goal: to feel better OT Goal Formulation: With patient Time For Goal Achievement: 05/04/19 Potential to Achieve Goals: Good  OT Frequency: Min 2X/week   Barriers to D/C: Decreased caregiver support          Co-evaluation              AM-PAC OT "6 Clicks" Daily Activity     Outcome Measure Help from another person eating meals?: None Help from another person taking care of personal grooming?: A Little Help from another person toileting, which includes using toliet, bedpan, or urinal?: Total Help from another person bathing (including washing, rinsing, drying)?: A Lot Help from another person to put on and taking off regular upper body clothing?: A Lot Help from another person to put on and taking off regular lower body clothing?: Total 6 Click Score: 13   End of Session Nurse Communication: Patient requests pain meds  Activity Tolerance: Patient limited by fatigue;Patient limited by pain Patient left: in bed;with call bell/phone within reach;with bed alarm set  OT Visit Diagnosis: Pain;Muscle weakness (generalized) (M62.81)                Time: [id number] OT Time Calculation (min): 18 min Charges:  OT General Charges $OT Visit: 1 Visit OT Evaluation $OT Eval Moderate Complexity: 1 Mod  [name], OTR/L Acute Rehabilitation Services Pager: [phone] Office: [phone]  [name] Bell 04/20/2019, 3:58 PM

## 2019-04-20 NOTE — Progress Notes (Addendum)
Paged for patient desatting to low 80s on her usual 3L, requiring 15L NRB to get sats to 88%. At bedside patient denies acute shortness of breath but is at her baseline. Denies increased cough from chronic cough at baseline, denies chest pain, abdominal pain, nausea. She denies difficulty eating or swallowing. She states she does feel more weak today and endorses chronic right leg pain. She was a&o.  Admitted for GI bleed, EGD earlier today w/gastritis & nonbleeding gastric ulcer in addition to explosive diarrhea from her ostomy with little output since admit yesterday.  HR 101, Sats 89% on 15L, BP 98/41. Afebrile. On PE she is diffusely TTP in the abd but states this is chronic, soft, nml BS. +Crackles in right lower lobe, otherwise CTA. No LE edema. CXR yesterday with left pleural effusion & atelectasis vs. Infiltrate. CT abd showed moderate b/l pleural effusions & compressive atelectasis.  Patient had to be rolled to the side for lung auscultation and had large productive cough with increase of saturations to 95% on room air.   - duoneb breathing treatment  - doubt aspiration s/p EGD w/sedation, crackles likely from chronic atelectasis & pleural effusions. Cough is also chronic and present yesterday per both nurse and patient.  She is back to baseline respiratory wise. Will consider chest xr if change in status.  - Consistently hypotensive with repeat bp, MAP 60. BP nml throughout the day. With softer bp, tachycardia, weakness and abd tenderness will obtain abd xr and CBC in setting of earlier EGD, recent GI bleed and low ost output.   - hold metop - of note this is for sinus tachycardia, which may explain her mild tachycardia. She did receive her am dose.  - bolus 500 and recheck blood pressure  Burnard Enis A, DO 04/20/2019, 9:49 PM Pager: [phone]

## 2019-04-20 NOTE — Progress Notes (Signed)
Subjective: HD#1 Events Overnight: No events overnight  Patient was seen this morning on rounds. She reports of a 2 and half week of chest pressure on the right side the 3rd rib at the costochondral junction. The pain is constant in nature. She rates the pain at 8/10, nagging and it makes her feel dizzy. She also reports of a 2 and half week of cough with increased production. She reports of palpable pain located at the left side. She has pleuritic cough  Objective:  Vital signs in last 24 hours: Vitals:   04/19/19 1758 04/19/19 2127 04/20/19 0234 04/20/19 0515  BP: 96/68 (!) 120/57 99/65 (!) 117/57  Pulse: 92 91 83 85  Resp:  14 19 16   Temp:  98.4 F (36.9 C) 98.6 F (37 C) 97.8 F (36.6 C)  TempSrc:  Oral Oral Oral  SpO2:  98% 98% 97%  Weight:      Height:       Supplemental O2: Room air  Physical Exam: Physical Exam  Constitutional: She appears malnourished. She appears cachectic. No distress.  HENT:  Head: Normocephalic and atraumatic.  Eyes: EOM are normal.  Cardiovascular: Normal rate and intact distal pulses.  No murmur heard. Pulmonary/Chest: Tachypnea noted. She has wheezes (upper lung fields). She exhibits tenderness (right 3rd rib, costochondral junction ).  Abdominal: Soft. She exhibits no distension. There is no abdominal tenderness.  Ostomy - is clean, pink, and moist without erythema or rash. No output in ostomy bag. She admits to chronic abdominal pain and perirectal pain that she believes is due to hemorrhoids   Musculoskeletal:        General: No edema. Normal range of motion.     Cervical back: Normal range of motion.     Comments: Dry skin bilateral LE   Neurological: She exhibits normal muscle tone.  Skin: She is not diaphoretic. No erythema.    Filed Weights   04/19/19 0219  Weight: 45.4 kg     Intake/Output Summary (Last 24 hours) at 04/20/2019 0909 Last data filed at 04/20/2019 0700 Gross per 24 hour  Intake 3146.48 ml  Output 700 ml    Net 2446.48 ml    Risk Score:  n/a  Pertinent labs/Imaging: CBC Latest Ref Rng & Units 04/20/2019 04/19/2019 04/19/2019  WBC 4.0 - 10.5 K/uL 6.2 6.9 -  Hemoglobin 12.0 - 15.0 g/dL 10.4(L) 9.4(L) 6.5(LL)  Hematocrit 36.0 - 46.0 % 34.0(L) 30.7(L) 19.0(L)  Platelets 150 - 400 K/uL 252 241 -    CMP Latest Ref Rng & Units 04/20/2019 04/19/2019 04/19/2019  Glucose 70 - 99 mg/dL 83 97 -  BUN 8 - 23 mg/dL <5(L) <5(L) -  Creatinine 0.44 - 1.00 mg/dL 0.55 0.54 -  Sodium 135 - 145 mmol/L 137 137 139  Potassium 3.5 - 5.1 mmol/L 4.0 3.2(L) 2.3(LL)  Chloride 98 - 111 mmol/L 99 99 -  CO2 22 - 32 mmol/L 28 27 -  Calcium 8.9 - 10.3 mg/dL 8.6(L) 8.3(L) -  Total Protein 6.5 - 8.1 g/dL 4.5(L) - -  Total Bilirubin 0.3 - 1.2 mg/dL 0.9 - -  Alkaline Phos 38 - 126 U/L 93 - -  AST 15 - 41 U/L 20 - -  ALT 0 - 44 U/L 10 - -    GI pathogen PCR panel: Pending C. difficile PCR: pending  Assessment/Plan:  Principal Problem:   Symptomatic anemia Active Problems:   Colostomy present (HCC)   Hypokalemia   Frailty   Patient Summary: [name] Bell  Medic is a 74 y.o. with pertinent PMH of depression, ischemic colitis, duodenal ulcer,hepatic steatosis,and anxiety who presented with dizziness and fatigue in setting of increased stool output from her ostomy and admit for symptomatic anemia on hospital day 1  #Symptomatic Anemia: #Microcytic hypoproliferative anemia: Patient's anemia stable overnight.  Her hemoglobin this morning is 10.4. She continues to have abdominal pain. She has no output in her ostomy bag, which is encouraging for improvement of her GI bleed.  -Continue PPI in the setting of GI bleed -Intends to perform EGD today -Appreciate GIs assistance comanaging this patient.  #Colostomy with high-output: #Hypokalemia: Patient has chronic abdominal pain with no output in her ostomy bag making infectious source unlikely.  -GI pathogen panel pending -C. difficile pending  #COPD  exacerbation: #Costochondritis: Patient admit to a 2 week history of increased productive cough, shrotness of breath, and right sided chest pain at the costochondral junction of the 3rd rib. The pain is reproducible with palpation. She has faint lung sounds intermixed with wheezing in her upper lung fields. CXR on admission shows pleural effusion but no opacities concerning for CAP. She admits to improvement of her symptoms with breathing treatments.  - Duo nebs 0.5-2.5mg /26ml nebulized q 4 hours prn - Proventil q 4 hours PRN - We will hold of on corticosteroids in the setting of GI bleed - Added dilaudid 0.5 mg IV for breath-through pain medicine and lidocaine patch.   #Decreased functional status: -PT/OT evaluation -patient has decreased functional status and likely will need further assistance in the outpatient setting. -Nutrition consult-recommendations.  Diet: NPO IVF: LR,100cc/hr VTE: SCDs Code: Full PT/OT recs: Pending TOC recs: Pending   Dispo: Anticipated discharge 1-2 days.    Marianna Payment, D.O. [facility], PGY-1 Date 04/20/2019 Time 9:09 AM

## 2019-04-20 NOTE — Progress Notes (Addendum)
Initial Nutrition Assessment  DOCUMENTATION CODES:   Underweight  INTERVENTION:   Ensure Enlive po BID, each supplement provides 350 kcal and 20 grams of protein  MVI with Minerals   NUTRITION DIAGNOSIS:   Inadequate oral intake related to poor appetite as evidenced by per patient/family report.  GOAL:   Patient will meet greater than or equal to 90% of their needs  MONITOR:   PO intake, Supplement acceptance, Labs, Weight trends  REASON FOR ASSESSMENT:   Consult Assessment of nutrition requirement/status  ASSESSMENT:   74 yo female admitted with symptomatic anemia and diarrhea. PMH includes ischemic colitis, colostomy due to diverticular perforation, duodenal ulcer, hepatic steatosis, COPD. Pt is legally blind.   RD working remotely.   Plan for EGD today, pt out of room today when attempted to call  Pt endorses weight loss but unsure how much. Current weight 45.4 kg; noted weight of 50 kg last year. 10% weight loss  Pt is legally blind; lives alone and has an aid come to her house around 3 hours/day to help with meal preparation  Hypokalemia resolved. Hgb up to 10.4  Suspect pt may very well be malnourished but not enough information currently to make recommendations regarding diagnosis. Plan for nutrition-focused physical exam on follow-up  Labs: reviewed Meds: reviewed  Diet Order:   Diet Order            Diet NPO time specified  Diet effective now              EDUCATION NEEDS:   Not appropriate for education at this time  Skin:  Skin Assessment: Reviewed RN Assessment  Last BM:  + stool via colostomy  Height:   Ht Readings from Last 1 Encounters:  04/19/19 5\' 3"  (1.6 m)    Weight:   Wt Readings from Last 1 Encounters:  04/19/19 45.4 kg    Ideal Body Weight:  52.3 kg  BMI:  Body mass index is 17.71 kg/m.  Estimated Nutritional Needs:   Kcal:  1575-1750 kcals  Protein:  75-85 g  Fluid:  >/= 1.6 L    Kerman Passey MS, RDN,  LDN, CNSC RD Pager Number and Weekend/On-Call After Hours Pager Located in [place]

## 2019-04-20 NOTE — Plan of Care (Signed)
  Problem: Pain Managment: Goal: General experience of comfort will improve Outcome: Progressing   

## 2019-04-20 NOTE — Transfer of Care (Signed)
Immediate Anesthesia Transfer of Care Note  Patient: [name] Bell  Procedure(s) Performed: ESOPHAGOGASTRODUODENOSCOPY (EGD) WITH PROPOFOL (N/A ) ESOPHAGEAL BRUSHING  Patient Location: Endoscopy Unit  Anesthesia Type:General  Level of Consciousness: awake  Airway & Oxygen Therapy: Patient Spontanous Breathing and Patient connected to face mask oxygen  Post-op Assessment: Report given to RN and Post -op Vital signs reviewed and unstable, Anesthesiologist notified  Post vital signs: Reviewed and stable  Last Vitals:  Vitals Value Taken Time  BP 127/51 04/20/19 1139  Temp 36.6 C 04/20/19 1139  Pulse 87 04/20/19 1146  Resp 23 04/20/19 1146  SpO2 97 % 04/20/19 1146  Vitals shown include unvalidated device data.  Last Pain:  Vitals:   04/20/19 1139  TempSrc: Oral  PainSc: 0-No pain      Patients Stated Pain Goal: 0 ([id number])  Complications: No apparent anesthesia complications

## 2019-04-20 NOTE — Interval H&P Note (Signed)
History and Physical Interval Note:  04/20/2019 10:31 AM  [name] Bell  has presented today for surgery, with the diagnosis of anemia.  The various methods of treatment have been discussed with the patient and family. After consideration of risks, benefits and other options for treatment, the patient has consented to  Procedure(s): ESOPHAGOGASTRODUODENOSCOPY (EGD) WITH PROPOFOL (N/A) as a surgical intervention.  The patient's history has been reviewed, patient examined, no change in status, stable for surgery.  I have reviewed the patient's chart and labs.  Questions were answered to the patient's satisfaction.     Lear Ng

## 2019-04-20 NOTE — Plan of Care (Signed)
°  Problem: Coping: °Goal: Level of anxiety will decrease °Outcome: Progressing °  °

## 2019-04-20 NOTE — Evaluation (Signed)
Physical Therapy Evaluation Patient Details Name: [name] Bell MRN: [id number] DOB: April 12, 1945 Today's Date: 04/20/2019   History of Present Illness  Patient is a 74 year old female admitted from home with h/o diarrhea, When admitted patient's HGB was 3.8. Patient is legally blind. PMH includes: colostomy, COPD, hypokalemia.  Clinical Impression  Patient received in bed, lethargic appearing. She was off unit for morning for procedure. She is very weak, requiring mod/max assist for limited bed mobility this visit. She declined further activity due to fatigue and pain " all over". She is requesting pain medicine at this time. She will continue to benefit from skilled PT while here to improve strength and functional independence.       Follow Up Recommendations SNF    Equipment Recommendations  None recommended by PT    Recommendations for Other Services       Precautions / Restrictions Precautions Precautions: Fall Precaution Comments: enteric precautions Restrictions Weight Bearing Restrictions: No      Mobility  Bed Mobility Overal bed mobility: Needs Assistance             General bed mobility comments: patient is very weak and needing mod/max assist for bed mobility this day  Transfers                 General transfer comment: Patient declined  Ambulation/Gait             General Gait Details: patient declined  Stairs            Wheelchair Mobility    Modified Rankin (Stroke Patients Only)       Balance                                             Pertinent Vitals/Pain Pain Assessment: Faces Faces Pain Scale: Hurts even more Pain Location: "all over" Pain Descriptors / Indicators: Sore Pain Intervention(s): Patient requesting pain meds-RN notified    Home Living Family/patient expects to be discharged to:: Private residence Living Arrangements: Alone Available Help at Discharge: Family;Available  PRN/intermittently;Personal care attendant Type of Home: Apartment Home Access: Level entry     Home Layout: One level Home Equipment: Cane - single point;Bedside commode;Tub bench;Hand held shower head;Walker - 2 wheels Additional Comments: Has PCA that comes 3x per week    Prior Function Level of Independence: Needs assistance   Gait / Transfers Assistance Needed: walking with walker or cane  ADL's / Homemaking Assistance Needed: pt was modified independnent in self care and assisted for housekeeping and errands by her aide        Hand Dominance   Dominant Hand: Right    Extremity/Trunk Assessment   Upper Extremity Assessment Upper Extremity Assessment: Generalized weakness    Lower Extremity Assessment Lower Extremity Assessment: Generalized weakness       Communication   Communication: No difficulties  Cognition Arousal/Alertness: Awake/alert Behavior During Therapy: WFL for tasks assessed/performed Overall Cognitive Status: Within Functional Limits for tasks assessed                                        General Comments      Exercises     Assessment/Plan    PT Assessment Patient needs continued PT services  PT Problem List Decreased strength;Decreased  mobility;Decreased safety awareness;Decreased activity tolerance;Pain       PT Treatment Interventions DME instruction;Therapeutic exercise;Gait training;Functional mobility training;Therapeutic activities;Patient/family education    PT Goals (Current goals can be found in the Care Plan section)  Acute Rehab PT Goals Patient Stated Goal: to feel better PT Goal Formulation: With patient Time For Goal Achievement: 05/04/19 Potential to Achieve Goals: Fair    Frequency Min 3X/week   Barriers to discharge Decreased caregiver support      Co-evaluation               AM-PAC PT "6 Clicks" Mobility  Outcome Measure Help needed turning from your back to your side while in a  flat bed without using bedrails?: A Lot Help needed moving from lying on your back to sitting on the side of a flat bed without using bedrails?: A Lot Help needed moving to and from a bed to a chair (including a wheelchair)?: A Lot Help needed standing up from a chair using your arms (e.g., wheelchair or bedside chair)?: A Lot Help needed to walk in hospital room?: A Lot Help needed climbing 3-5 steps with a railing? : A Lot 6 Click Score: 12    End of Session   Activity Tolerance: Patient limited by fatigue;Patient limited by pain Patient left: in bed;with bed alarm set;with call bell/phone within reach Nurse Communication: Mobility status;Patient requests pain meds PT Visit Diagnosis: Muscle weakness (generalized) (M62.81);Difficulty in walking, not elsewhere classified (R26.2);Other abnormalities of gait and mobility (R26.89);Pain Pain - part of body: (all over)    Time: [id number] PT Time Calculation (min) (ACUTE ONLY): 15 min   Charges:   PT Evaluation $PT Eval Moderate Complexity: 1 Mod PT Treatments $Therapeutic Activity: 8-22 mins        Dalylah Ramey, PT, GCS 04/20/19,3:47 PM

## 2019-04-20 NOTE — Progress Notes (Signed)
Patient's O2 saturation on 6L/[place] is 82-83%. Patient's lungs dimished on auscultation,patient denies any SOB,states "no more than usual". BP 114/41 HR 105 T 98.5 On call MD text paged. Will continue to monitor. Lindy Garczynski, Wonda Cheng, Therapist, sports

## 2019-04-21 ENCOUNTER — Inpatient Hospital Stay (HOSPITAL_COMMUNITY): Payer: Medicare Other

## 2019-04-21 DIAGNOSIS — M940 Chondrocostal junction syndrome [Tietze]: Secondary | ICD-10-CM

## 2019-04-21 DIAGNOSIS — J441 Chronic obstructive pulmonary disease with (acute) exacerbation: Secondary | ICD-10-CM

## 2019-04-21 DIAGNOSIS — K760 Fatty (change of) liver, not elsewhere classified: Secondary | ICD-10-CM

## 2019-04-21 LAB — CBC
HCT: 32.7 % — ABNORMAL LOW (ref 36.0–46.0)
HCT: 32.1 % — ABNORMAL LOW (ref 36.0–46.0)
Hemoglobin: 9.8 g/dL — ABNORMAL LOW (ref 12.0–15.0)
Hemoglobin: 9.7 g/dL — ABNORMAL LOW (ref 12.0–15.0)
MCH: 24.3 pg — ABNORMAL LOW (ref 26.0–34.0)
MCH: 24.1 pg — ABNORMAL LOW (ref 26.0–34.0)
MCHC: 30 g/dL (ref 30.0–36.0)
MCHC: 30.2 g/dL (ref 30.0–36.0)
MCV: 80.9 fL (ref 80.0–100.0)
MCV: 79.9 fL — ABNORMAL LOW (ref 80.0–100.0)
Platelets: 220 10*3/uL (ref 150–400)
Platelets: 213 10*3/uL (ref 150–400)
RBC: 4.04 MIL/uL (ref 3.87–5.11)
RBC: 4.02 MIL/uL (ref 3.87–5.11)
RDW: 19.2 % — ABNORMAL HIGH (ref 11.5–15.5)
RDW: 19.7 % — ABNORMAL HIGH (ref 11.5–15.5)
WBC: 6 10*3/uL (ref 4.0–10.5)
WBC: 7.1 10*3/uL (ref 4.0–10.5)
nRBC: 0 % (ref 0.0–0.2)
nRBC: 0 % (ref 0.0–0.2)

## 2019-04-21 LAB — COMPREHENSIVE METABOLIC PANEL
ALT: 10 U/L (ref 0–44)
AST: 19 U/L (ref 15–41)
Albumin: 1.9 g/dL — ABNORMAL LOW (ref 3.5–5.0)
Alkaline Phosphatase: 80 U/L (ref 38–126)
Anion gap: 11 (ref 5–15)
BUN: <5 mg/dL — ABNORMAL LOW (ref 8–23)
CO2: 27 mmol/L (ref 22–32)
Calcium: 8.5 mg/dL — ABNORMAL LOW (ref 8.9–10.3)
Chloride: 101 mmol/L (ref 98–111)
Creatinine, Ser: 0.49 mg/dL (ref 0.44–1.00)
GFR calc Af Amer: >60 mL/min (ref >60)
GFR calc non Af Amer: >60 mL/min (ref >60)
Glucose, Bld: 87 mg/dL (ref 70–99)
Potassium: 3.3 mmol/L — ABNORMAL LOW (ref 3.5–5.1)
Sodium: 139 mmol/L (ref 135–145)
Total Bilirubin: 0.8 mg/dL (ref 0.3–1.2)
Total Protein: 4.1 g/dL — ABNORMAL LOW (ref 6.5–8.1)

## 2019-04-21 LAB — C DIFFICILE QUICK SCREEN W PCR REFLEX
C Diff antigen: NEGATIVE
C Diff interpretation: NOT DETECTED
C Diff toxin: NEGATIVE

## 2019-04-21 LAB — CYTOLOGY - NON PAP

## 2019-04-21 MED ORDER — METOPROLOL TARTRATE 12.5 MG HALF TABLET: 12.5000 mg | ORAL_TABLET | Freq: Two times a day (BID) | ORAL | Status: DC

## 2019-04-21 MED ORDER — METOPROLOL TARTRATE 12.5 MG HALF TABLET
12.5000 mg | ORAL_TABLET | Freq: Two times a day (BID) | ORAL | Status: DC
  Administered 2019-04-21 – 2019-04-25 (×9): 12.5 mg via ORAL
  Filled 2019-04-21 (×9): qty 1

## 2019-04-21 MED ORDER — POTASSIUM CHLORIDE 10 MEQ/100ML IV SOLN
10.0000 meq | INTRAVENOUS | Status: AC
  Administered 2019-04-21 (×3): 10 meq via INTRAVENOUS
  Filled 2019-04-21: qty 100

## 2019-04-21 NOTE — Progress Notes (Signed)
Subjective: HD#2 Events Overnight: Overnight patient had an episode of desaturations in the 80s on 3 L.  She was transitioned to 15 L nonrebreather with improvement of her saturations to 88%.   Patient was seen this morning on rounds. She complains of lower extremity pain as well as rectal pain. Her ostomy bag has not been changed since she was admitted to the hospital. Prior to her admission, she was experiencing explosive diarrhea  Objective:  Vital signs in last 24 hours: Vitals:   04/20/19 2147 04/20/19 2317 04/21/19 0042 04/21/19 0449  BP: (!) 98/40 (!) 98/41 (!) 113/50 (!) 116/51  Pulse: 99 (!) 102 (!) 108 99  Resp: 18 19 20 16   Temp: 98.2 F (36.8 C) 98.3 F (36.8 C) 98.1 F (36.7 C) 99 F (37.2 C)  TempSrc: Oral Oral Oral Oral  SpO2: 98% 95% 94% 95%  Weight:      Height:       Supplemental O2: 2-4 L nasal cannula  Physical Exam: Physical Exam  Constitutional: She is oriented to person, place, and time.  HENT:  Head: Normocephalic and atraumatic.  Eyes: EOM are normal.  Cardiovascular: Normal rate and intact distal pulses.  Pulmonary/Chest: Effort normal. No respiratory distress. She has no wheezes.  Abdominal: Soft. She exhibits no distension.  Patient had minimal output in her ostomy bag.  None of red or dark stools  Musculoskeletal:        General: No tenderness or edema. Normal range of motion.     Cervical back: Normal range of motion.  Neurological: She is alert and oriented to person, place, and time.  Skin: Skin is warm and dry. She is not diaphoretic.    Filed Weights   04/19/19 0219 04/20/19 2030  Weight: 45.4 kg 61.4 kg     Intake/Output Summary (Last 24 hours) at 04/21/2019 0543 Last data filed at 04/21/2019 0100 Gross per 24 hour  Intake 968.23 ml  Output 700 ml  Net 268.23 ml    Risk Score:  N/A  Pertinent labs/Imaging: CBC Latest Ref Rng & Units 04/21/2019 04/20/2019 04/20/2019  WBC 4.0 - 10.5 K/uL 7.1 6.0 6.2  Hemoglobin 12.0 - 15.0  g/dL 9.7(L) 9.8(L) 10.4(L)  Hematocrit 36.0 - 46.0 % 32.1(L) 32.7(L) 34.0(L)  Platelets 150 - 400 K/uL 213 220 252    CMP Latest Ref Rng & Units 04/21/2019 04/20/2019 04/19/2019  Glucose 70 - 99 mg/dL 87 83 97  BUN 8 - 23 mg/dL <5(L) <5(L) <5(L)  Creatinine 0.44 - 1.00 mg/dL 0.49 0.55 0.54  Sodium 135 - 145 mmol/L 139 137 137  Potassium 3.5 - 5.1 mmol/L 3.3(L) 4.0 3.2(L)  Chloride 98 - 111 mmol/L 101 99 99  CO2 22 - 32 mmol/L 27 28 27   Calcium 8.9 - 10.3 mg/dL 8.5(L) 8.6(L) 8.3(L)  Total Protein 6.5 - 8.1 g/dL 4.1(L) 4.5(L) -  Total Bilirubin 0.3 - 1.2 mg/dL 0.8 0.9 -  Alkaline Phos 38 - 126 U/L 80 93 -  AST 15 - 41 U/L 19 20 -  ALT 0 - 44 U/L 10 10 -   GI pathogen panel: pending  C. Diff PCR: pending  XR Abdomen: Nonobstructive bowel gas pattern.  EGD: - One benign-appearing, intrinsic moderate (circumferential scarring or stenosis; an endoscope may pass) stenosis was found at the gastroesophageal junction. The stenosis was traversed. - Patchy, white plaques were found in the distal esophagus. Cells for cytology were obtained by brushing. - Localized mild inflammation characterized by congestion (edema) and erythema  was found in the gastric antrum. - The cardia and gastric fundus were normal on retroflexion. - The examined duodenum was normal. - One non-bleeding superficial gastric ulcer with pigmented material was found in the prepyloric region of the stomach. The lesion was 3 mm in largest dimension.  Assessment/Plan:  Principal Problem:   Symptomatic anemia Active Problems:   Colostomy present ([place])   Hypokalemia   Frailty  Patient Summary: [name] Bell is a 74 y.o. with pertinent PMH of depression, ischemic colitis, duodenal ulcer,hepatic steatosis,and anxiety who presented with  dizziness and fatigue in setting of increased stool output from her ostomy and admit for symptomatic anemia on hospital day 2  #Symptomatic Anemia: #Microcytic hypoproliferative  anemia: Patient is 1 day status post upper endoscopy for GI bleed.  Patient's hemoglobin is stable at 9.7.  She continues to have some abdominal pain with diffuse chronic pain throughout her body. -Continue PPI -Appreciate GIs assistance  #Colostomy with high-output: #Hypokalemia: Unlikely to be infectious source as she has had low ostomy output. - Pending C. Dif and GI panel  #COPD exacerbation: #Costochondritis: Patient's breathing is much improved.  She is on 2 L nasal cannula at this time and saturating well.  Not appear to be in any respiratory distress. -Continue DuoNeb 0.5-2.5 mg / 3 mL nebulized every 4 hours as needed -Continue Proventil every 4 hours as needed  #Decreased functional status: -PT recommends SNF for subacute physical therapy.  Diet: Advance as tolerated IVF: None,None VTE: SCDs Code: Full PT/OT recs: SNF for Subacute PT TOC recs: pending    Dispo: Anticipated discharge pending SNF placement.    [name] Bell, D.O. MCIMTP, PGY-1 Date 04/21/2019 Time 5:43 AM

## 2019-04-21 NOTE — Progress Notes (Signed)
PT Cancellation Note  Patient Details Name: [name] Bell MRN: [id number] DOB: 1945/06/24   Cancelled Treatment:    Reason Eval/Treat Not Completed: Pain limiting ability to participate.  Pt restless in the bed, grunting, reporting pain in bil legs, abdomen, back.  Did not want to work with PT today due to pain.  RN bring pain meds into the room while I was there.  Pt did not want heat or ice, to be repositioned or to move to see if that would help.  She was agreeable for PT to check back at a later date.    Verdene Lennert, PT, DPT  Acute Rehabilitation [phone] pager #[phone] office       Wells Guiles B Torri Michalski 04/21/2019, 4:30 PM

## 2019-04-21 NOTE — Progress Notes (Addendum)
[facility] Gastroenterology Progress Note  KYNSIE GOERKE 74 y.o. Oct 16, 1945 Patient presenting with anemia and increased ostomy output.  Underwent EGD yesterday which revealed the following: -Benign-appearing esophageal stenosis. - Esophageal plaques, suspicious for candidiasis. (cytology pending) - Acute gastritis. - Non-bleeding gastric ulcer with pigmented material.  Subjective: Patient reports continued abdominal discomfort, noting she always has abdominal discomfort at baseline.  She is having leg and back pain which is more bothersome than the abdominal pain.  Per RN, patient has had minimal ostomy output.  Objective: Vital signs: Vitals:   04/21/19 0449 04/21/19 1041  BP: (!) 116/51 122/61  Pulse: 99 (!) 101  Resp: 16 20  Temp: 99 F (37.2 C) 98.3 F (36.8 C)  SpO2: 95% 94%    Physical Exam: Gen: Lethargic, elderly, thin, no acute distress  HEENT: anicteric sclera CV: RRR Chest: Diffuse wheezing noted bilaterally. Abd: Soft with tenderness in LLQ.  No rebound or guarding.  Normoactive bowel sounds. Ext: no edema  Lab Results: Recent Labs    04/20/19 0351 04/21/19 0417  NA 137 139  K 4.0 3.3*  CL 99 101  CO2 28 27  GLUCOSE 83 87  BUN <5* <5*  CREATININE 0.55 0.49  CALCIUM 8.6* 8.5*   Recent Labs    04/20/19 0351 04/21/19 0417  AST 20 19  ALT 10 10  ALKPHOS 93 80  BILITOT 0.9 0.8  PROT 4.5* 4.1*  ALBUMIN 2.0* 1.9*   Recent Labs    04/19/19 0040 04/19/19 0616 04/20/19 2327 04/21/19 0417  WBC 7.1   < > 6.0 7.1  NEUTROABS 5.4  --   --   --   HGB 3.8*   < > 9.8* 9.7*  HCT 15.5*   < > 32.7* 32.1*  MCV 72.4*   < > 80.9 79.9*  PLT 365   < > 220 213   < > = values in this interval not displayed.    Assessment/Plan: Anemia is improving, hemoglobin of 9.7 today.  Decreased ostomy output with no frank bleeding.  GI pathogen panel pending, C. difficile negative.  Continue Protonix 40 mg IV twice daily.     Salley Slaughter 04/21/2019, 12:42  PM  Questions please call [phone]

## 2019-04-21 NOTE — Plan of Care (Signed)
  Problem: Education: Goal: Knowledge of General Education information will improve Description Including pain rating scale, medication(s)/side effects and non-pharmacologic comfort measures Outcome: Progressing   

## 2019-04-21 NOTE — TOC Initial Note (Addendum)
Transition of Care [facility]) - Initial/Assessment Note    Patient Details  Name: [name] Bell MRN: [id number] Date of Birth: March 31, 1945  Transition of [facility]) CM/SW Contact:    Bartholomew Crews, RN Phone Number: [phone] 04/21/2019, 1:52 PM  Clinical Narrative:                 Spoke with patient at the bedside. PTA home alone. Noted from address that she lives at [facility], confirmed with patient. States that she has an Engineer, production for 3 hrs/day 7 days/week from [employer]. Her neighbors provides transportation to medical appointments. Her PCP is at the [facility]. [facility] [address]. DME in the home include a RW, 3N1, and nebulizer that she uses 1-2 times per day (albuterol) and she has an inhaler for when she goes out. Her neighbor assists her with setting up her pill box.   Discussed recommendations for SNF. She is agreeable. States that in the past she has been to [facility]. She has previously had [facility] for RN and PT through [facility] but is not currently active for [facility]. Advised of SNF process and will provide her with bed offers to choose. FL2 faxed out.   UPDATE: Bed offers provided to patient. She is not ready to make a decision at the moment d/t having a lot of pain. NCM to follow up in the morning.   TOC team following for transition needs.   Expected Discharge Plan: Skilled Nursing Facility Barriers to Discharge: Insurance Authorization, Continued Medical Work up   Patient Goals and CMS Choice Patient states their goals for this hospitalization and ongoing recovery are:: agreeable to short term rehab CMS Medicare.gov Compare Post Acute Care list provided to:: Patient Choice offered to / list presented to : Patient  Expected Discharge Plan and Services Expected Discharge Plan: [place] In-house Referral: Clinical Social Work Discharge Planning Services: CM Consult Post Acute Care Choice: [place] arrangements  for the past 2 months: Apartment                 DME Arranged: N/A DME Agency: NA       HH Arranged: NA [place] Agency: NA        Prior Living Arrangements/Services Living arrangements for the past 2 months: Apartment Lives with:: Self Patient language and need for interpreter reviewed:: Yes        Need for Family Participation in Patient Care: Yes (Comment) Care giver support system in place?: Yes (comment) Current home services: Homehealth aide, DME(has a caregiver 3 hours/day 7 days a week from [employer]; DME includes RW, 3N1, nebulizer) Criminal Activity/Legal Involvement Pertinent to Current Situation/Hospitalization: No - Comment as needed  Activities of Daily Living Home Assistive Devices/Equipment: Environmental consultant (specify type) ADL Screening (condition at time of admission) Patient's cognitive ability adequate to safely complete daily activities?: Yes Is the patient deaf or have difficulty hearing?: No Does the patient have difficulty seeing, even when wearing glasses/contacts?: No Does the patient have difficulty concentrating, remembering, or making decisions?: No Patient able to express need for assistance with ADLs?: Yes Does the patient have difficulty dressing or bathing?: No Independently performs ADLs?: Yes (appropriate for developmental age) Does the patient have difficulty walking or climbing stairs?: Yes Weakness of Legs: Both Weakness of Arms/Hands: None  Permission Sought/Granted                  Emotional Assessment Appearance:: Appears stated age Attitude/Demeanor/Rapport:  Engaged Affect (typically observed): Accepting Orientation: : Oriented to Self, Oriented to  Time, Oriented to Place, Oriented to Situation Alcohol / Substance Use: Not Applicable Psych Involvement: No (comment)  Admission diagnosis:  Dehydration [E86.0] Hypokalemia [E87.6] Acute diarrhea [R19.7] Pleural effusion on left [J90] Acute anemia [D64.9] Patient Active Problem List    Diagnosis Date Noted  . Symptomatic anemia 04/19/2019  . Frailty 04/19/2019  . External hemorrhoids without complication 123XX123  . Swelling of lower extremity 08/10/2018  . Pain due to onychomycosis of toenails of both feet 08/03/2018  . At risk for adverse drug reaction 03/29/2018  . Malnutrition of moderate degree 03/28/2018  . Duodenal ulcer disease 07/27/2017  . Hepatic steatosis 07/03/2017  . Aortic atherosclerosis ([place]) 06/04/2017  . Iron deficiency anemia 06/04/2017  . History of ischemic colitis 06/03/2017  . S/P sigmoid colectomy 06/03/2017  . Back pain, chronic 08/14/2016  . Hypokalemia 03/31/2016  . Macular degeneration 01/14/2012  . Preventative health care 01/14/2012  . COPD (chronic obstructive pulmonary disease) ([place]) 11/25/2011  . Colostomy present ([place]) 10/27/2011  . Hyperlipidemia 01/24/2009  . Long term (current) use of opiate analgesic 01/03/2009  . Major depression, chronic 03/08/2008  . Osteoporosis 03/03/2007   PCP:  Mosetta Anis, MD Pharmacy[facility]   [id number] [place], [place] [id number] [place] [id number] Phone: [phone] Fax: [phone]  [place], [place] [address] Phone: [phone] Fax: [place], [place] [address] Phone: [phone] Fax: [phone]     Social Determinants of Health (SDOH) Interventions    Readmission Risk Interventions No flowsheet data found.

## 2019-04-21 NOTE — NC FL2 (Signed)
[place] MEDICAID FL2 LEVEL OF CARE SCREENING TOOL     IDENTIFICATION  Patient Name: [name] Bell Birthdate: April 17, 1945 Sex: female Admission Date (Current Location): 04/18/2019  [place] and [place] Number:  Kathleen Argue [id number] [place] and Address:  The . [facility], [place] [address], [place], [place] [address]      Provider Number: [id number]  Attending Physician Name and Address:  Axel Filler, *  Relative Name and Phone Number:  [name], friend [phone]    Current Level of Care: Hospital Recommended Level of Care: [place] Prior Approval Number:    Date Approved/Denied: 08/04/11 PASRR Number: [id number]  Discharge Plan: SNF    Current Diagnoses: Patient Active Problem List   Diagnosis Date Noted  . Symptomatic anemia 04/19/2019  . Frailty 04/19/2019  . External hemorrhoids without complication 123XX123  . Swelling of lower extremity 08/10/2018  . Pain due to onychomycosis of toenails of both feet 08/03/2018  . At risk for adverse drug reaction 03/29/2018  . Malnutrition of moderate degree 03/28/2018  . Duodenal ulcer disease 07/27/2017  . Hepatic steatosis 07/03/2017  . Aortic atherosclerosis ([place]) 06/04/2017  . Iron deficiency anemia 06/04/2017  . History of ischemic colitis 06/03/2017  . S/P sigmoid colectomy 06/03/2017  . Back pain, chronic 08/14/2016  . Hypokalemia 03/31/2016  . Macular degeneration 01/14/2012  . Preventative health care 01/14/2012  . COPD (chronic obstructive pulmonary disease) ([place]) 11/25/2011  . Colostomy present ([place]) 10/27/2011  . Hyperlipidemia 01/24/2009  . Long term (current) use of opiate analgesic 01/03/2009  . Major depression, chronic 03/08/2008  . Osteoporosis 03/03/2007    Orientation RESPIRATION BLADDER Height & Weight     Self, Time, Situation, Place  O2 Incontinent Weight: 61.4 kg Height:  5\' 3"  (160 cm)  BEHAVIORAL SYMPTOMS/MOOD NEUROLOGICAL BOWEL  NUTRITION STATUS      Continent Diet  AMBULATORY STATUS COMMUNICATION OF NEEDS Skin   Extensive Assist   Normal                       Personal Care Assistance Level of Assistance  Bathing, Dressing, Feeding Bathing Assistance: Maximum assistance Feeding assistance: Limited assistance Dressing Assistance: Maximum assistance     Functional Limitations Info  Sight, Hearing, Speech Sight Info: Impaired Hearing Info: Adequate Speech Info: Adequate    SPECIAL CARE FACTORS FREQUENCY  PT (By licensed PT), OT (By licensed OT)     PT Frequency: PT at SNF to eval and treat a min of 5x/week OT Frequency: OT at SNF to eval and treat a min of 5x/week            Contractures Contractures Info: Not present    Additional Factors Info  Code Status, Allergies Code Status Info: Full Allergies Info: strawberry, duloxetine, ibuprofen, nsaids, latex           Current Medications (04/21/2019):  This is the current hospital active medication list Current Facility-Administered Medications  Medication Dose Route Frequency Provider Last Rate Last Admin  . 0.9 %  sodium chloride infusion   Intravenous PRN Wilford Corner, MD   Stopped at 04/20/19 [phone]  . acetaminophen (TYLENOL) tablet 650 mg  650 mg Oral Q6H PRN Wilford Corner, MD   650 mg at 04/21/19 1315   Or  . acetaminophen (TYLENOL) suppository 650 mg  650 mg Rectal Q6H PRN Wilford Corner, MD      . albuterol (PROVENTIL) (2.5 MG/3ML) 0.083% nebulizer solution 3 mL  3 mL Inhalation Q4H PRN  Wilford Corner, MD   3 mL at 04/21/19 1046  . cetaphil lotion   Topical PRN Wilford Corner, MD   Given at 04/21/19 1029  . feeding supplement (ENSURE ENLIVE) (ENSURE ENLIVE) liquid 237 mL  237 mL Oral BID BM Wilford Corner, MD   237 mL at 04/21/19 1049  . fluticasone (FLONASE) 50 MCG/ACT nasal spray 2 spray  2 spray Each Nare Daily Wilford Corner, MD   2 spray at 04/21/19 1043  . HYDROcodone-acetaminophen (NORCO) 7.5-325 MG per  tablet 1 tablet  1 tablet Oral Q6H PRN Wilford Corner, MD   1 tablet at 04/21/19 1020  . HYDROmorphone (DILAUDID) injection 0.5 mg  0.5 mg Intravenous Q12H PRN Wilford Corner, MD   0.5 mg at 04/21/19 0602  . ipratropium-albuterol (DUONEB) 0.5-2.5 (3) MG/3ML nebulizer solution 3 mL  3 mL Nebulization Q4H PRN Wilford Corner, MD   3 mL at 04/20/19 2125  . lactose free nutrition (BOOST PLUS) liquid 237 mL  1 Container Oral TID BM PRN Wilford Corner, MD      . lidocaine (LIDODERM) 5 % 1 patch  1 patch Transdermal Q24H Wilford Corner, MD   1 patch at 04/21/19 1021  . metoprolol tartrate (LOPRESSOR) tablet 12.5 mg  12.5 mg Oral BID Marianna Payment, MD   12.5 mg at 04/21/19 1020  . multivitamin with minerals tablet 1 tablet  1 tablet Oral Daily Wilford Corner, MD   1 tablet at 04/21/19 1020  . pantoprazole (PROTONIX) injection 40 mg  40 mg Intravenous Q12H Wilford Corner, MD   40 mg at 04/21/19 1025  . pravastatin (PRAVACHOL) tablet 40 mg  40 mg Oral Daily Wilford Corner, MD   40 mg at 04/21/19 1021  . promethazine (PHENERGAN) tablet 12.5 mg  12.5 mg Oral Q6H PRN Wilford Corner, MD   12.5 mg at 04/20/19 1816  . traZODone (DESYREL) tablet 25 mg  25 mg Oral BID Wilford Corner, MD   25 mg at 04/21/19 1020     Discharge Medications: Please see discharge summary for a list of discharge medications.  Relevant Imaging Results:  Relevant Lab Results:   Additional Information SS#: [id number]  Bartholomew Crews, RN

## 2019-04-22 DIAGNOSIS — Z79891 Long term (current) use of opiate analgesic: Secondary | ICD-10-CM

## 2019-04-22 DIAGNOSIS — G8929 Other chronic pain: Secondary | ICD-10-CM

## 2019-04-22 LAB — CBC
HCT: 37.3 % (ref 36.0–46.0)
Hemoglobin: 11 g/dL — ABNORMAL LOW (ref 12.0–15.0)
MCH: 23.9 pg — ABNORMAL LOW (ref 26.0–34.0)
MCHC: 29.5 g/dL — ABNORMAL LOW (ref 30.0–36.0)
MCV: 81.1 fL (ref 80.0–100.0)
Platelets: 235 10*3/uL (ref 150–400)
RBC: 4.6 MIL/uL (ref 3.87–5.11)
RDW: 21.2 % — ABNORMAL HIGH (ref 11.5–15.5)
WBC: 7.4 10*3/uL (ref 4.0–10.5)
nRBC: 0.7 % — ABNORMAL HIGH (ref 0.0–0.2)

## 2019-04-22 LAB — BASIC METABOLIC PANEL
Anion gap: 11 (ref 5–15)
BUN: <5 mg/dL — ABNORMAL LOW (ref 8–23)
CO2: 27 mmol/L (ref 22–32)
Calcium: 8.8 mg/dL — ABNORMAL LOW (ref 8.9–10.3)
Chloride: 102 mmol/L (ref 98–111)
Creatinine, Ser: 0.43 mg/dL — ABNORMAL LOW (ref 0.44–1.00)
GFR calc Af Amer: >60 mL/min (ref >60)
GFR calc non Af Amer: >60 mL/min (ref >60)
Glucose, Bld: 97 mg/dL (ref 70–99)
Potassium: 3.2 mmol/L — ABNORMAL LOW (ref 3.5–5.1)
Sodium: 140 mmol/L (ref 135–145)

## 2019-04-22 MED ORDER — LACTATED RINGERS IV SOLN: INTRAVENOUS | Status: DC

## 2019-04-22 MED ORDER — POTASSIUM CHLORIDE CRYS ER 20 MEQ PO TBCR
20.0000 meq | EXTENDED_RELEASE_TABLET | Freq: Two times a day (BID) | ORAL | Status: DC
  Administered 2019-04-22 (×2): 20 meq via ORAL
  Filled 2019-04-22 (×2): qty 1

## 2019-04-22 MED ORDER — MORPHINE SULFATE ER 15 MG PO TBCR
30.0000 mg | EXTENDED_RELEASE_TABLET | Freq: Two times a day (BID) | ORAL | Status: DC
  Administered 2019-04-22: 30 mg via ORAL
  Filled 2019-04-22: qty 2

## 2019-04-22 NOTE — Progress Notes (Signed)
UPDATE NOTE:  Paged for blood pressure of 86/37. Assessed pt at bedside. Asymptomatic. mentating well. Blood pressure rechecked--90/42. Will continue to monitor.

## 2019-04-22 NOTE — Plan of Care (Signed)
  Problem: Nutrition: Goal: Adequate nutrition will be maintained Outcome: Progressing   Problem: Safety: Goal: Ability to remain free from injury will improve Outcome: Progressing   

## 2019-04-22 NOTE — Progress Notes (Signed)
Subjective: HD#3 Events Overnight: No overnight events  Patient was seen this morning on rounds. Today, [name] Bell states that she is in pain this morning. She was able to tolerate solid food yesterday without any issues. She states that her ostomy "pushes to the at night and keeps pushing with a hump." She reports of mucus coming out of the ostomy.   Objective:  Vital signs in last 24 hours: Vitals:   04/21/19 2330 04/22/19 0504 04/22/19 0826 04/22/19 0949  BP: 123/61 (!) 131/57    Pulse: (!) 101 96    Resp:  17    Temp:  98.5 F (36.9 C)    TempSrc:  Oral    SpO2: 94% 96% 95% 93%  Weight:      Height:       Supplemental O2: 3 L nasal cannula   Physical Exam: Physical Exam  Constitutional: She is oriented to person, place, and time. She appears distressed (pain).  HENT:  Head: Normocephalic and atraumatic.  Eyes: EOM are normal.  Cardiovascular: Normal rate and intact distal pulses.  No murmur heard. Pulmonary/Chest: Effort normal. No respiratory distress. She has no wheezes.  Abdominal: Soft. She exhibits no distension. There is abdominal tenderness (mild diffuse tenderness).  Musculoskeletal:        General: No edema. Normal range of motion.     Cervical back: Normal range of motion.  Neurological: She is alert and oriented to person, place, and time.  Skin: Skin is dry. She is not diaphoretic.    Filed Weights   04/19/19 0219 04/20/19 2030 04/21/19 1901  Weight: 45.4 kg 61.4 kg 57.7 kg     Intake/Output Summary (Last 24 hours) at 04/22/2019 1112 Last data filed at 04/22/2019 0800 Gross per 24 hour  Intake 557 ml  Output 200 ml  Net 357 ml    Risk Score:  n/a  Pertinent labs/Imaging: CBC Latest Ref Rng & Units 04/22/2019 04/21/2019 04/20/2019  WBC 4.0 - 10.5 K/uL 7.4 7.1 6.0  Hemoglobin 12.0 - 15.0 g/dL 11.0(L) 9.7(L) 9.8(L)  Hematocrit 36.0 - 46.0 % 37.3 32.1(L) 32.7(L)  Platelets 150 - 400 K/uL 235 213 220    CMP Latest Ref Rng & Units  04/22/2019 04/21/2019 04/20/2019  Glucose 70 - 99 mg/dL 97 87 83  BUN 8 - 23 mg/dL <5(L) <5(L) <5(L)  Creatinine 0.44 - 1.00 mg/dL 0.43(L) 0.49 0.55  Sodium 135 - 145 mmol/L 140 139 137  Potassium 3.5 - 5.1 mmol/L 3.2(L) 3.3(L) 4.0  Chloride 98 - 111 mmol/L 102 101 99  CO2 22 - 32 mmol/L 27 27 28   Calcium 8.9 - 10.3 mg/dL 8.8(L) 8.5(L) 8.6(L)  Total Protein 6.5 - 8.1 g/dL - 4.1(L) 4.5(L)  Total Bilirubin 0.3 - 1.2 mg/dL - 0.8 0.9  Alkaline Phos 38 - 126 U/L - 80 93  AST 15 - 41 U/L - 19 20  ALT 0 - 44 U/L - 10 10    No results found.   Assessment/Plan:  Principal Problem:   Symptomatic anemia Active Problems:   Colostomy present ([place])   Hypokalemia   Frailty    Patient Summary: [name] Bell is a 74 y.o. with pertinent PMH of depression, ischemic colitis, duodenal ulcer,hepatic steatosis,and anxiety who presented with dizziness and fatigue in setting of increased stool output and admit for symptomatic anemia on hospital day 3   #Symptomatic Anemia: #Microcytic hypoproliferative anemia: Patient's hemoglobin level is stable.  Continues to have some mild diffuse abdominal pain -Continue PPI -  Appreciate GIs assistance  #Colostomy with high-output: #Hypokalemia: To have no to little stool output. -C. difficile PCR and GI panel pending  #COPD exacerbation: #Costochondritis: Patient's respiratory status is stable.  She is saturating well on 3 L nasal cannula and tolerating her nebulizer treatments well.  She states that she feels like she still has some difficulty breathing.  Clinically she appears to be overall improved. -Nebulizer treatments as needed -Pulmonal oxygen as needed  #Decreased functional status:  -Patient will be discharged to SNF for subacute PT  #Chronic Pain: She continues to have pain in her legs and back which is chronic for her.  We will restart her on all home opioid medications for pain. -Restart MS Contin 40 mg today -Continued Norco  7.5-325  Diet: Heart Healthy IVF: None,None VTE: Enoxaparin Code: Full PT/OT recs: SNF for Subacute PT TOC recs: Patient has been accepted at a SNF.   Dispo: Anticipated discharge pending pain management but otherwise stable for discharge to SNF.Marland Kitchen    Marianna Payment, D.O. MCIMTP, PGY-1 Date 04/22/2019 Time 11:12 AM

## 2019-04-22 NOTE — Plan of Care (Signed)
  Problem: Education: Goal: Knowledge of General Education information will improve Description: Including pain rating scale, medication(s)/side effects and non-pharmacologic comfort measures Outcome: Progressing   Problem: Pain Managment: Goal: General experience of comfort will improve Outcome: Progressing   

## 2019-04-22 NOTE — Progress Notes (Addendum)
MEWS Patient BP low 86/37 and 52 HR. Notified MD and made aware. Patient asymptomatic, responding to verbal. O2 Sat 95% . Encourage to increase fluid intake. Service to call back later. Seen by MD at bedside and checked BP 90/42 with patient alert and oriented, responsive to verbal.

## 2019-04-23 LAB — GI PATHOGEN PANEL BY PCR, STOOL

## 2019-04-23 LAB — BASIC METABOLIC PANEL
Anion gap: 7 (ref 5–15)
BUN: <5 mg/dL — ABNORMAL LOW (ref 8–23)
CO2: 29 mmol/L (ref 22–32)
Calcium: 8.1 mg/dL — ABNORMAL LOW (ref 8.9–10.3)
Chloride: 105 mmol/L (ref 98–111)
Creatinine, Ser: 0.4 mg/dL — ABNORMAL LOW (ref 0.44–1.00)
GFR calc Af Amer: >60 mL/min (ref >60)
GFR calc non Af Amer: >60 mL/min (ref >60)
Glucose, Bld: 93 mg/dL (ref 70–99)
Potassium: 3 mmol/L — ABNORMAL LOW (ref 3.5–5.1)
Sodium: 141 mmol/L (ref 135–145)

## 2019-04-23 LAB — CBC
HCT: 35.8 % — ABNORMAL LOW (ref 36.0–46.0)
Hemoglobin: 10.3 g/dL — ABNORMAL LOW (ref 12.0–15.0)
MCH: 24.2 pg — ABNORMAL LOW (ref 26.0–34.0)
MCHC: 28.8 g/dL — ABNORMAL LOW (ref 30.0–36.0)
MCV: 84.2 fL (ref 80.0–100.0)
Platelets: 251 10*3/uL (ref 150–400)
RBC: 4.25 MIL/uL (ref 3.87–5.11)
RDW: 22 % — ABNORMAL HIGH (ref 11.5–15.5)
WBC: 7 10*3/uL (ref 4.0–10.5)
nRBC: 0.3 % — ABNORMAL HIGH (ref 0.0–0.2)

## 2019-04-23 MED ORDER — ALBUTEROL SULFATE (2.5 MG/3ML) 0.083% IN NEBU
2.5000 mg | INHALATION_SOLUTION | Freq: Two times a day (BID) | RESPIRATORY_TRACT | Status: DC
  Administered 2019-04-23 – 2019-04-25 (×4): 2.5 mg via RESPIRATORY_TRACT
  Filled 2019-04-23 (×4): qty 3

## 2019-04-23 MED ORDER — MORPHINE SULFATE ER 15 MG PO TBCR
15.0000 mg | EXTENDED_RELEASE_TABLET | Freq: Two times a day (BID) | ORAL | Status: DC
  Administered 2019-04-23: 15 mg via ORAL
  Filled 2019-04-23: qty 1

## 2019-04-23 MED ORDER — MORPHINE SULFATE ER 15 MG PO TBCR
30.0000 mg | EXTENDED_RELEASE_TABLET | Freq: Two times a day (BID) | ORAL | Status: DC
  Administered 2019-04-23 – 2019-04-25 (×4): 30 mg via ORAL
  Filled 2019-04-23 (×5): qty 2

## 2019-04-23 MED ORDER — POTASSIUM CHLORIDE CRYS ER 20 MEQ PO TBCR
40.0000 meq | EXTENDED_RELEASE_TABLET | Freq: Once | ORAL | Status: AC
  Administered 2019-04-23: 40 meq via ORAL
  Filled 2019-04-23: qty 2

## 2019-04-23 MED ORDER — POTASSIUM CHLORIDE CRYS ER 20 MEQ PO TBCR
40.0000 meq | EXTENDED_RELEASE_TABLET | Freq: Four times a day (QID) | ORAL | Status: AC
  Administered 2019-04-23 – 2019-04-24 (×2): 40 meq via ORAL
  Filled 2019-04-23 (×2): qty 2

## 2019-04-23 NOTE — Plan of Care (Signed)
  Problem: Education: Goal: Knowledge of General Education information will improve Description: Including pain rating scale, medication(s)/side effects and non-pharmacologic comfort measures Outcome: Progressing   Problem: Pain Managment: Goal: General experience of comfort will improve Outcome: Progressing   

## 2019-04-23 NOTE — Anesthesia Postprocedure Evaluation (Signed)
Anesthesia Post Note  Patient: [name] Bell  Procedure(s) Performed: ESOPHAGOGASTRODUODENOSCOPY (EGD) WITH PROPOFOL (N/A ) ESOPHAGEAL BRUSHING     Patient location during evaluation: PACU Anesthesia Type: General Level of consciousness: awake and alert Pain management: pain level controlled Vital Signs Assessment: post-procedure vital signs reviewed and stable Respiratory status: spontaneous breathing, nonlabored ventilation, respiratory function stable and patient connected to nasal cannula oxygen Cardiovascular status: stable and blood pressure returned to baseline Postop Assessment: no apparent nausea or vomiting Anesthetic complications: no    Last Vitals:  Vitals:   04/23/19 1258 04/23/19 1655  BP:  (!) 114/45  Pulse:  (!) 103  Resp:  18  Temp:  36.8 C  SpO2: 97% 96%    Last Pain:  Vitals:   04/23/19 1655  TempSrc: Oral  PainSc:                  [name] Bell

## 2019-04-23 NOTE — Progress Notes (Addendum)
   Subjective: HD#4   Overnight: She had an episode of hypotension to 80s/30s with repeat of 90s/40s.  She remained asymptomatic during these episodes  Today, [name] Bell reports that her pain is much better after we had placed her back on her home morphine 30 mg twice daily.  I did make her aware that I have decreased her morphine to 15 mg twice daily due to her episode of hypotension and she states that she has always had lower blood pressures.  Objective:  Vital signs in last 24 hours: Vitals:   04/22/19 2306 04/22/19 2317 04/23/19 0424 04/23/19 0500  BP: (!) 113/49 (!) 116/50 (!) 113/48   Pulse: 92 96 (!) 103   Resp: 18  17   Temp: 98.4 F (36.9 C) 98.6 F (37 C) 97.8 F (36.6 C)   TempSrc: Oral Oral Oral   SpO2: 98% 99% 96%   Weight:    57.7 kg  Height:       Const: In no apparent distress Abd: Ostomy bag in place with brown stool Ext: Chronic bilateral lower extremity stasis dermatitis  Assessment/Plan:  Principal Problem:   Symptomatic anemia Active Problems:   Colostomy present (HCC)   Hypokalemia   Frailty  [name] Bell is a 74 y.o. with pertinent PMH of depression, ischemic colitis, duodenal ulcer,hepatic steatosis,and anxiety who presented with dizziness and fatigue in setting of increased stool output and admit for symptomatic anemia on hospital day 4  #Symptomatic Anemia: #Microcytic hypoproliferative anemia: Hemoglobin stable at 10.3 this morning -Continue PPI -Appreciate GIs assistance   #Colostomy with high-output: #Hypokalemia: C. Diff negative -GI panel pending   #COPD exacerbation: #Costochondritis: Remain on 3 L [place] -Nebulizer treatments as needed -Pulmonal oxygen as needed   #Decreased functional status:  -Pending SNF placement   #Chronic Pain: Hypotension overnight likely due to hypovolemia -Continue MS Contin 30 mg BID -Continued Norco 7.5-325   FEN: Heart healthy VTE ppx: SCDs CODE STATUS: Full code  Prior to  Admission Living Arrangement: Home Anticipated Discharge Location: SNF Barriers to Discharge: Pending SNF placement Dispo: Anticipated discharge in approximately 1-2 day(s).    Jean Rosenthal, MD 04/23/2019, 6:44 AM Pager: [phone] Internal Medicine Teaching Service

## 2019-04-23 NOTE — Plan of Care (Signed)
  Problem: Clinical Measurements: Goal: Respiratory complications will improve Outcome: Progressing   Problem: Nutrition: Goal: Adequate nutrition will be maintained Outcome: Progressing   

## 2019-04-23 NOTE — TOC Progression Note (Signed)
Transition of Care [facility]) - Progression Note    Patient Details  Name: [name] Bell MRN: [id number] Date of Birth: [birth year]  Transition of Care [facility]) CM/SW [place], [place] Phone Number: 04/23/2019, 5:07 PM  Clinical Narrative:    CSW met bedside with patient to provide bed offers. Patient expressed she would like return home once medically stable for discharge. Patient expressed she has an aid 7 days a week that comes into the home for 3 hours a day, as well as DME. She expressed her neighbor checks on her frequently. CSW reported to patient that she has multiple SNF offers and if the discharge plan changes, CSW can assist. No further questions at this time. CSW will continue to follow and assist with discharge planning needs.   Expected Discharge Plan: Skilled Nursing Facility Barriers to Discharge: Ship broker, Continued Medical Work up  Expected Discharge Plan and Services Expected Discharge Plan: [place] In-house Referral: Clinical Social Work Discharge Planning Services: CM Consult Post Acute Care Choice: [place] Living arrangements for the past 2 months: Apartment                 DME Arranged: N/A DME Agency: NA       HH Arranged: NA HH Agency: NA         Social Determinants of Health (SDOH) Interventions    Readmission Risk Interventions No flowsheet data found.

## 2019-04-24 LAB — CULTURE, BLOOD (ROUTINE X 2)
Culture: NO GROWTH
Culture: NO GROWTH
Special Requests: ADEQUATE

## 2019-04-24 LAB — CBC
HCT: 36.5 % (ref 36.0–46.0)
Hemoglobin: 10.5 g/dL — ABNORMAL LOW (ref 12.0–15.0)
MCH: 24.6 pg — ABNORMAL LOW (ref 26.0–34.0)
MCHC: 28.8 g/dL — ABNORMAL LOW (ref 30.0–36.0)
MCV: 85.5 fL (ref 80.0–100.0)
Platelets: 246 10*3/uL (ref 150–400)
RBC: 4.27 MIL/uL (ref 3.87–5.11)
RDW: 22.9 % — ABNORMAL HIGH (ref 11.5–15.5)
WBC: 6.7 10*3/uL (ref 4.0–10.5)
nRBC: 0.3 % — ABNORMAL HIGH (ref 0.0–0.2)

## 2019-04-24 MED ORDER — OMEPRAZOLE 40 MG PO CPDR: DELAYED_RELEASE_CAPSULE | ORAL | 0 refills | Status: DC

## 2019-04-24 MED FILL — OMEPRAZOLE 40 MG CPDR: 40 | 40 days supply | Qty: 60 | Fill #0

## 2019-04-24 NOTE — Progress Notes (Signed)
SATURATION QUALIFICATIONS: (This note is used to comply with regulatory documentation for home oxygen)  Patient Saturations on Room Air at Rest = 90%  Patient Saturations on Room Air while Ambulating = 74%  Patient Saturations on 3 Liters of oxygen while Ambulating = 93%  Please briefly explain why patient needs home oxygen:  O2 saturation on RA dropped to 74% while getting up from bed to the chair. Applied 3L O2 via [place] and O2 saturation increased to 93%. Pt needs O2 at home.  Paulla Fore, RN, BSN

## 2019-04-24 NOTE — Progress Notes (Signed)
PT on 3 Liters supplimental O2 maintaining 92% saturation at rest. PT desats to 81-82% on room air at rest.

## 2019-04-24 NOTE — TOC Progression Note (Signed)
Transition of Care [facility]) - Progression Note    Patient Details  Name: [name] Bell MRN: [id number] Date of Birth: September 17, 1945  Transition of Care [facility]) CM/SW Contact  Bartholomew Crews, RN Phone Number: [phone] 04/24/2019, 5:07 PM  Clinical Narrative:    Patient declining SNF at this time. Agreeable to [facility] services and has 80 hours/month of home care through [facility]. Requested [place] orders for RN, PT, OT, SW to assist patient with skilled needs. NCM unable to find a home health agency who is able to accept the [facility] referral for this patient. Agencies called include [facility], [facility], [facility], [facility], [facility], [facility], and [facility]. Pending call back for [facility].   Notified by RN that patient desatted to 74% on RA. Patient does not have oxygen at home. MD notified.   TOC team following for transition needs.    Expected Discharge Plan: Skilled Nursing Facility Barriers to Discharge: Ship broker, Continued Medical Work up  Expected Discharge Plan and Services Expected Discharge Plan: [place] In-house Referral: Clinical Social Work Discharge Planning Services: CM Consult Post Acute Care Choice: [place] Living arrangements for the past 2 months: Apartment Expected Discharge Date: 04/24/19               DME Arranged: N/A DME Agency: NA       HH Arranged: NA HH Agency: NA         Social Determinants of Health (SDOH) Interventions    Readmission Risk Interventions No flowsheet data found.

## 2019-04-24 NOTE — Progress Notes (Signed)
Physical Therapy Treatment Patient Details Name: [name] Bell MRN: [id number] DOB: [birth year] Today's Date: 04/24/2019    History of Present Illness Patient is a 74 year old female admitted from home with h/o diarrhea, When admitted patient's HGB was 3.8. Patient is legally blind. PMH includes: colostomy, COPD, hypokalemia.    PT Comments    Pt saturated in urine upon PT arrival to room, and required PT assist for pericare. Pt requires min-mod physical assist for bed mobility, transfer to standing, and small stepping at EOB. Pt is very weak and unsteady, and requires significant increased time to mobilize. PT strongly recommending SNF for pt safety and mobility progression upon d/c, but pt is adamant this session that she is going home with assist of aide 3 hours a day, neighbors, and HHPT. Case management in room at end of PT session where PT states pt at home alone is very unsafe and pt is at very increased risk of falls. Pt remains set on d/c home, plans to d/c today.   Of note, pt on 3LO2 upon arrival to room, pt removed O2 during moving from supine to sit and desatted to 82% with DOE 3/4. 3LO2 replaced with sats recovering to 91% and above.    Follow Up Recommendations  SNF     Equipment Recommendations  None recommended by PT    Recommendations for Other Services       Precautions / Restrictions Precautions Precautions: Fall Restrictions Weight Bearing Restrictions: No    Mobility  Bed Mobility Overal bed mobility: Needs Assistance Bed Mobility: Rolling;Sidelying to Sit;Sit to Supine Rolling: Min assist Sidelying to sit: Mod assist   Sit to supine: Mod assist   General bed mobility comments: min assist for roll bilaterally for completion of trunk translation, mod assist for supine<>sit for LE management, trunk elevation and lowering, scooting to and from EOB with use of bed pad.  Transfers Overall transfer level: Needs assistance Equipment used: Rolling walker (2  wheeled) Transfers: Sit to/from Stand Sit to Stand: Min assist;From elevated surface         General transfer comment: Min assist for power up and steadying upon standing, very increased time and stand x2 for placement of clean bed pads as pt incontinent of urine.  Ambulation/Gait     Assistive device: Rolling walker (2 wheeled) Gait Pattern/deviations: Step-to pattern Gait velocity: decr   General Gait Details: pt took 4 side steps towards head of bed only, min assist to steady and unable to progress to forward ambulation this day.   Stairs             Wheelchair Mobility    Modified Rankin (Stroke Patients Only)       Balance Overall balance assessment: Needs assistance Sitting-balance support: No upper extremity supported;Feet supported Sitting balance-Leahy Scale: Fair     Standing balance support: Bilateral upper extremity supported Standing balance-Leahy Scale: Poor                              Cognition Arousal/Alertness: Awake/alert Behavior During Therapy: WFL for tasks assessed/performed Overall Cognitive Status: Impaired/Different from baseline Area of Impairment: Memory;Following commands;Safety/judgement;Problem solving                     Memory: Decreased short-term memory Following Commands: Follows one step commands with increased time Safety/Judgement: Decreased awareness of safety;Decreased awareness of deficits   Problem Solving: Decreased initiation;Requires tactile cues;Difficulty sequencing;Requires verbal cues  General Comments: Pt reporting "I am going home" upon PT arrival to room, when case management note from today states pt to d/c to [facility] which pt was agreeable to a few hours ago. Pt acts as if she is in shock and disbelief that the plan is for her to go SNF      Exercises      General Comments        Pertinent Vitals/Pain Pain Assessment: Faces Faces Pain Scale: Hurts little more Pain Location:  "all over" Pain Descriptors / Indicators: Sore Pain Intervention(s): Limited activity within patient's tolerance;Monitored during session;Repositioned    Home Living                      Prior Function            PT Goals (current goals can now be found in the care plan section) Acute Rehab PT Goals Patient Stated Goal: to feel better PT Goal Formulation: With patient Time For Goal Achievement: 05/04/19 Potential to Achieve Goals: Fair Progress towards PT goals: Progressing toward goals    Frequency    Min 2X/week      PT Plan Current plan remains appropriate    Co-evaluation              AM-PAC PT "6 Clicks" Mobility   Outcome Measure  Help needed turning from your back to your side while in a flat bed without using bedrails?: A Lot Help needed moving from lying on your back to sitting on the side of a flat bed without using bedrails?: A Lot Help needed moving to and from a bed to a chair (including a wheelchair)?: A Lot Help needed standing up from a chair using your arms (e.g., wheelchair or bedside chair)?: A Lot Help needed to walk in hospital room?: A Lot Help needed climbing 3-5 steps with a railing? : Total 6 Click Score: 11    End of Session Equipment Utilized During Treatment: Gait belt Activity Tolerance: Patient limited by fatigue;Patient limited by pain Patient left: in bed;with bed alarm set;with call bell/phone within reach Nurse Communication: Mobility status PT Visit Diagnosis: Muscle weakness (generalized) (M62.81);Difficulty in walking, not elsewhere classified (R26.2);Other abnormalities of gait and mobility (R26.89);Pain Pain - part of body: (all over)     Time: 1450-1539 PT Time Calculation (min) (ACUTE ONLY): 49 min  Charges:  $Therapeutic Activity: 23-37 mins $Self Care/Home Management: 8-22                     Cacie Gaskins E, PT Acute Rehabilitation Services Pager [phone]  Office [phone]   [name] Bell D Icel Castles 04/24/2019,  4:55 PM

## 2019-04-24 NOTE — Progress Notes (Signed)
Subjective: HD#5 Events Overnight: No events overnight  Patient was seen this morning on rounds.  Patient is resting comfortably in her bed.  She denies new complaints at this time.  She states that her pain is well controlled now she is back on her home regimen.  Objective:  Vital signs in last 24 hours: Vitals:   04/23/19 1655 04/23/19 2109 04/23/19 2205 04/24/19 0526  BP: (!) 114/45 (!) 107/59  124/60  Pulse: (!) 103 (!) 102  (!) 101  Resp: 18 17  18   Temp: 98.2 F (36.8 C) 98.5 F (36.9 C)  98.3 F (36.8 C)  TempSrc: Oral Oral    SpO2: 96% 95% 97% 97%  Weight:  57.7 kg    Height:       Supplemental O2: 3 L nasal cannula  Physical Exam: Physical Exam  Constitutional: She is oriented to person, place, and time. No distress.  HENT:  Head: Normocephalic and atraumatic.  Eyes: EOM are normal.  Cardiovascular: Normal rate and intact distal pulses.  No murmur heard. Pulmonary/Chest: Effort normal. No respiratory distress.  Abdominal: Soft. She exhibits no distension. There is no abdominal tenderness.  Musculoskeletal:        General: No tenderness or edema. Normal range of motion.     Cervical back: Normal range of motion.  Neurological: She is alert and oriented to person, place, and time.  Skin: Skin is warm and dry. She is not diaphoretic.    Filed Weights   04/21/19 1901 04/23/19 0500 04/23/19 2109  Weight: 57.7 kg 57.7 kg 57.7 kg     Intake/Output Summary (Last 24 hours) at 04/24/2019 0606 Last data filed at 04/24/2019 0400 Gross per 24 hour  Intake 3034.72 ml  Output 700 ml  Net 2334.72 ml    Risk Score: n/a  Pertinent labs/Imaging: CBC Latest Ref Rng & Units 04/23/2019 04/22/2019 04/21/2019  WBC 4.0 - 10.5 K/uL 7.0 7.4 7.1  Hemoglobin 12.0 - 15.0 g/dL 10.3(L) 11.0(L) 9.7(L)  Hematocrit 36.0 - 46.0 % 35.8(L) 37.3 32.1(L)  Platelets 150 - 400 K/uL 251 235 213    CMP Latest Ref Rng & Units 04/23/2019 04/22/2019 04/21/2019  Glucose 70 - 99 mg/dL 93 97 87    BUN 8 - 23 mg/dL <5(L) <5(L) <5(L)  Creatinine 0.44 - 1.00 mg/dL 0.40(L) 0.43(L) 0.49  Sodium 135 - 145 mmol/L 141 140 139  Potassium 3.5 - 5.1 mmol/L 3.0(L) 3.2(L) 3.3(L)  Chloride 98 - 111 mmol/L 105 102 101  CO2 22 - 32 mmol/L 29 27 27   Calcium 8.9 - 10.3 mg/dL 8.1(L) 8.8(L) 8.5(L)  Total Protein 6.5 - 8.1 g/dL - - 4.1(L)  Total Bilirubin 0.3 - 1.2 mg/dL - - 0.8  Alkaline Phos 38 - 126 U/L - - 80  AST 15 - 41 U/L - - 19  ALT 0 - 44 U/L - - 10    Assessment/Plan:  Principal Problem:   Symptomatic anemia Active Problems:   Colostomy present (HCC)   Hypokalemia   Frailty    Patient Summary: [name] Bell is a 74 y.o. with pertinent PMH of depression, ischemic colitis, duodenal ulcer,hepatic steatosis,and anxiety who presented with dizziness and fatigue  and admit for increased stool outputand admit for symptomatic anemia on hospital day 5   #Symptomatic Anemia: #Microcytic hypoproliferative anemia: Hemoglobin stable at 10.3 this morning.  She denies any symptoms of anemia. -Continue PPI -Appreciate GIs assistance   #Colostomy with high-output: #Hypokalemia: Patient's C. difficile PCR and GI panel were  negative.  Patient does not appear to be having any signs or symptoms concerning for infectious etiology.  #COPD exacerbation: #Costochondritis: Patient is breathing well on 3 L nasal cannula.  We will ambulate the patient today to see if she desaturates and needs oxygen at home. -She will likely need to be discharged on home oxygen -Continue nebulizers as needed  #Decreased functional status: Patient will benefit from SNF placement for subacute PT. -Patient is stable for discharge pending stent placement  #Chronic Pain: Patient's pain is much better controlled today.  We will continue her home pain medicine.   - Continue MS Contin 30 mg twice daily  - Continue Norco 7.5-325 mg   Diet: Heart Healthy IVF: None,None VTE: Enoxaparin Code: Full PT/OT  recs: SNF for Subacute PT TOC recs: Waiting on SNF placement   Dispo: Anticipated discharge pending SNF placement.    Marianna Payment, D.O. [facility], PGY-1 Date 04/24/2019 Time 6:06 AM

## 2019-04-24 NOTE — TOC Progression Note (Signed)
Transition of Care [facility] - Progression Note    Patient Details  Name: [name] Bell MRN: [id number] Date of Birth: 1945-02-07  Transition of Care [facility]) CM/SW Contact  Bartholomew Crews, RN Phone Number: [phone] 04/24/2019, 2:18 PM  Clinical Narrative:    Spoke with patient at the bedside. Discussed transition to SNF when medically ready. Reviewed choice of bed offers. Patient agreeable to [name] stating that she has been there before. Her goal is to be back in her apartment at [name]. [name] contacted about patient choice, and will f/u with bed availiability. Insurance authorization initiated. TOC team following.    Expected Discharge Plan: Skilled Nursing Facility Barriers to Discharge: Ship broker, Continued Medical Work up  Expected Discharge Plan and Services Expected Discharge Plan: [place] In-house Referral: Clinical Social Work Discharge Planning Services: CM Consult Post Acute Care Choice: [place] Living arrangements for the past 2 months: Apartment                 DME Arranged: N/A DME Agency: NA       HH Arranged: NA HH Agency: NA         Social Determinants of Health (SDOH) Interventions    Readmission Risk Interventions No flowsheet data found.

## 2019-04-25 DIAGNOSIS — J90 Pleural effusion, not elsewhere classified: Secondary | ICD-10-CM

## 2019-04-25 DIAGNOSIS — Z6823 Body mass index (BMI) 23.0-23.9, adult: Secondary | ICD-10-CM

## 2019-04-25 DIAGNOSIS — J449 Chronic obstructive pulmonary disease, unspecified: Secondary | ICD-10-CM

## 2019-04-25 DIAGNOSIS — K2901 Acute gastritis with bleeding: Principal | ICD-10-CM

## 2019-04-25 DIAGNOSIS — H547 Unspecified visual loss: Secondary | ICD-10-CM

## 2019-04-25 DIAGNOSIS — E43 Unspecified severe protein-calorie malnutrition: Secondary | ICD-10-CM

## 2019-04-25 LAB — CBC
HCT: 40.3 % (ref 36.0–46.0)
Hemoglobin: 11.2 g/dL — ABNORMAL LOW (ref 12.0–15.0)
MCH: 24.2 pg — ABNORMAL LOW (ref 26.0–34.0)
MCHC: 27.8 g/dL — ABNORMAL LOW (ref 30.0–36.0)
MCV: 87.2 fL (ref 80.0–100.0)
Platelets: 235 10*3/uL (ref 150–400)
RBC: 4.62 MIL/uL (ref 3.87–5.11)
RDW: 23.2 % — ABNORMAL HIGH (ref 11.5–15.5)
WBC: 7.9 10*3/uL (ref 4.0–10.5)
nRBC: 0 % (ref 0.0–0.2)

## 2019-04-25 MED ORDER — PANTOPRAZOLE SODIUM 40 MG PO TBEC
40.0000 mg | DELAYED_RELEASE_TABLET | Freq: Two times a day (BID) | ORAL | Status: DC
  Administered 2019-04-25: 40 mg via ORAL
  Filled 2019-04-25: qty 1

## 2019-04-25 NOTE — Progress Notes (Signed)
DISCHARGE NOTE HOME [name] Bell to be discharged Home per MD order. Discussed prescriptions and follow up appointments with the patient. Prescriptions given to patient; medication list explained in detail. Patient verbalized understanding.  Skin clean, dry and intact without evidence of skin break down, no evidence of skin tears noted. IV catheter discontinued intact. Site without signs and symptoms of complications. Dressing and pressure applied. Pt denies pain at the site currently. No complaints noted.  Patient free of lines, drains, and wounds.   An After Visit Summary (AVS) was printed and given to the patient. Patient escorted via wheelchair, and discharged home via private auto.  Arlyss Repress, RN

## 2019-04-25 NOTE — Plan of Care (Signed)
°  Problem: Coping: °Goal: Level of anxiety will decrease °Outcome: Progressing °  °

## 2019-04-25 NOTE — Progress Notes (Signed)
Subjective: HD#6 Events Overnight: Patient started yesterday evening but she does not want to go to SNF and would like to go home with home health.  Patient was seen this morning on rounds. Patient is doing well today. She is still requiring 3 L.. Otherwise,   Objective:  Vital signs in last 24 hours: Vitals:   04/24/19 1717 04/24/19 2037 04/24/19 2058 04/25/19 0501  BP: (!) 158/94  134/74 117/76  Pulse: (!) 118  (!) 107 (!) 105  Resp: 18  18 20   Temp: 98.3 F (36.8 C)  98 F (36.7 C) 98.6 F (37 C)  TempSrc: Oral  Oral Oral  SpO2: 94% 97% 95% 95%  Weight:   59.6 kg   Height:       Supplemental O2: 3L [place]   Physical Exam: Physical Exam  Constitutional: She is oriented to person, place, and time and well-developed, well-nourished, and in no distress.  HENT:  Head: Normocephalic and atraumatic.  Eyes: EOM are normal.  Cardiovascular: Normal rate and intact distal pulses.  Pulmonary/Chest: Effort normal. No respiratory distress.  Abdominal: Soft. She exhibits no distension.  Musculoskeletal:        General: No edema. Normal range of motion.     Cervical back: Normal range of motion.  Neurological: She is alert and oriented to person, place, and time.  Skin: Skin is warm and dry.    Filed Weights   04/23/19 0500 04/23/19 2109 04/24/19 2058  Weight: 57.7 kg 57.7 kg 59.6 kg     Intake/Output Summary (Last 24 hours) at 04/25/2019 0613 Last data filed at 04/25/2019 0554 Gross per 24 hour  Intake 1519.04 ml  Output 650 ml  Net 869.04 ml    Risk Score:  n/a  Pertinent labs/Imaging: CBC Latest Ref Rng & Units 04/25/2019 04/24/2019 04/23/2019  WBC 4.0 - 10.5 K/uL 7.9 6.7 7.0  Hemoglobin 12.0 - 15.0 g/dL 11.2(L) 10.5(L) 10.3(L)  Hematocrit 36.0 - 46.0 % 40.3 36.5 35.8(L)  Platelets 150 - 400 K/uL 235 246 251    CMP Latest Ref Rng & Units 04/23/2019 04/22/2019 04/21/2019  Glucose 70 - 99 mg/dL 93 97 87  BUN 8 - 23 mg/dL <5(L) <5(L) <5(L)  Creatinine 0.44 - 1.00 mg/dL  0.40(L) 0.43(L) 0.49  Sodium 135 - 145 mmol/L 141 140 139  Potassium 3.5 - 5.1 mmol/L 3.0(L) 3.2(L) 3.3(L)  Chloride 98 - 111 mmol/L 105 102 101  CO2 22 - 32 mmol/L 29 27 27   Calcium 8.9 - 10.3 mg/dL 8.1(L) 8.8(L) 8.5(L)  Total Protein 6.5 - 8.1 g/dL - - 4.1(L)  Total Bilirubin 0.3 - 1.2 mg/dL - - 0.8  Alkaline Phos 38 - 126 U/L - - 80  AST 15 - 41 U/L - - 19  ALT 0 - 44 U/L - - 10    Assessment/Plan:  Principal Problem:   Symptomatic anemia Active Problems:   Colostomy present (HCC)   Hypokalemia   Frailty    Patient Summary: [name] Bell is a 74 y.o. with pertinent PMH of depression, ischemic colitis, duodenal ulcer,hepatic steatosis,and anxiety who presented with  dizziness and fatigue and admit for  increased stool outputand admit for symptomatic anemia  on hospital day 6   #Symptomatic Anemia: #Microcytic hypoproliferative anemia: Stable. Patient's GI bleed has resolved. -Continue oral PPI.  #COPD: Patient saturating well on 3 L nasal cannula.  She quickly desaturates off of oxygen.  We will continue nebulizer treatments as needed.  #Decreased functional status: PT OT recommends SNF  for subacute physical therapy.  Patient initially said that she would like to go to SNF but has recently declined SNF placement.  We will discharge her today with home health.  #Chronic Pain: Continue all home pain medicine for her chronic pain.  Diet: Heart Healthy IVF: None,None VTE: Enoxaparin Code: Full PT/OT recs: None TOC recs: Home health   Dispo: Anticipated discharge today with home health.    Marianna Payment, D.O. MCIMTP, PGY-1 Date 04/25/2019 Time 6:14 AM

## 2019-04-25 NOTE — TOC Transition Note (Signed)
Transition of Care [facility]) - CM/SW Discharge Note   Patient Details  Name: ILEANNA GIANNI MRN: [id number] Date of Birth: 1945/06/13  Transition of Care [facility]) CM/SW Contact:  Bartholomew Crews, RN Phone Number: [phone] 04/25/2019, 11:15 AM   Clinical Narrative:    Referral placed to [employer] for oxygen. Spoke with MD about disposition home. Spoke with patient with [facility] liaison who accepted patient referral today for RN, PT, OT, SW. HH orders have been placed. [employer] to deliver portable oxygen concentrator to the room, and in a couple days will schedule a home concentrator delivery. Patient has made arrangements with one of her caregivers to provide transportation home and to stay with her overnight. No further TOC needs identified at this this time.      Barriers to Discharge: Ship broker, Continued Medical Work up   Patient Goals and CMS Choice Patient states their goals for this hospitalization and ongoing recovery are:: agreeable to short term rehab CMS Medicare.gov Compare Post Acute Care list provided to:: Patient Choice offered to / list presented to : Patient  Discharge Placement                       Discharge Plan and Services In-house Referral: Clinical Social Work Discharge Planning Services: CM Consult Post Acute Care Choice: [place]          DME Arranged: N/A DME Agency: NA       HH Arranged: NA HH Agency: NA        Social Determinants of Health (SDOH) Interventions     Readmission Risk Interventions No flowsheet data found.

## 2019-04-25 NOTE — H&P (Deleted)
Name: [name] Bell MRN: [id number] DOB: May 28, 1945 74 y.o. PCP: [name] Anis, MD  Date of Admission: 04/18/2019 11:34 PM Date of Discharge: 04/25/2019 Attending Physician: Dr. Evette Doffing  Discharge Diagnosis: 1.  Acute upper GI bleeding due to gastritis 2.  COPD 3.  Moderate size pleural effusions 4.  Deconditioning 5.  Severe protein calorie malnutrition  Discharge Medications: Allergies as of 04/25/2019      Reactions   Strawberry (diagnostic) Rash   Duloxetine Nausea And Vomiting   Ibuprofen Other (See Comments)   REACTION: Bleeding ulcers   Nsaids Other (See Comments)   REACTION: Bleeding Ulcer   Latex Itching      Medication List    STOP taking these medications   Eliquis 5 MG Tabs tablet Generic drug: apixaban   esomeprazole 40 MG capsule Commonly known as: NEXIUM   fluticasone 50 MCG/ACT nasal spray Commonly known as: FLONASE   hydrocortisone cream 1 %   witch hazel-glycerin pad Commonly known as: TUCKS     TAKE these medications   albuterol (2.5 MG/3ML) 0.083% nebulizer solution Commonly known as: PROVENTIL Take 3 mLs (2.5 mg total) by nebulization every 6 (six) hours as needed for wheezing or shortness of breath. What changed: Another medication with the same name was changed. Make sure you understand how and when to take each.   albuterol 108 (90 Base) MCG/ACT inhaler Commonly known as: VENTOLIN HFA TAKE 2 PUFFS BY MOUTH EVERY 6 HOURS AS NEEDED FOR WHEEZE OR SHORTNESS OF BREATH What changed: See the new instructions.   cyclobenzaprine 5 MG tablet Commonly known as: FLEXERIL TAKE 1 TABLET BY MOUTH THREE TIMES A DAY AS NEEDED FOR MUSCLE SPASMS What changed: See the new instructions.   docusate sodium 100 MG capsule Commonly known as: Colace Take 1 capsule (100 mg total) by mouth 2 (two) times daily. To prevent constipation while taking pain medication.   DULoxetine 60 MG capsule Commonly known as: CYMBALTA Take 60 mg by mouth daily.     feeding supplement (BOOST BREEZE) Liqd Take 1 Container by mouth 3 (three) times daily between meals as needed.   ferrous gluconate 324 MG tablet Commonly known as: FERGON TAKE 1 TABLET BY MOUTH DAILY WITH BREAKFAST   furosemide 40 MG tablet Commonly known as: LASIX TAKE 1 TABLET BY MOUTH EVERY DAY   HYDROcodone-acetaminophen 7.5-325 MG tablet Commonly known as: Norco Take 1 tablet by mouth every 6 (six) hours as needed for moderate pain or severe pain.   metoprolol tartrate 25 MG tablet Commonly known as: LOPRESSOR Take 12.5 mg by mouth 2 (two) times daily.   morphine 30 MG 12 hr tablet Commonly known as: MS CONTIN Take 1 tablet (30 mg total) by mouth every 12 (twelve) hours. Give one by mouth every twelve hours as needed for pain   omeprazole 40 MG capsule Commonly known as: PRILOSEC Take 1 capsule (40 mg total) by mouth in the morning and at bedtime for 20 days, THEN 1 capsule (40 mg total) daily for 20 days. Start taking on: April 24, 2019   ondansetron 4 MG tablet Commonly known as: ZOFRAN TAKE 1 TABLET BY MOUTH EVERY 8 HOURS AS NEEDED FOR NAUSEA AND VOMITING What changed: See the new instructions.   potassium chloride 10 MEQ CR capsule Commonly known as: MICRO-K TAKE 1 CAPSULE (10 MEQ TOTAL) BY MOUTH 2 (TWO) TIMES DAILY.   pravastatin 40 MG tablet Commonly known as: PRAVACHOL TAKE 1 TABLET BY MOUTH EVERY DAY   traZODone 50  MG tablet Commonly known as: DESYREL TAKE 0.5 TABLETS (25 MG TOTAL) BY MOUTH 2 (TWO) TIMES DAILY.       Disposition and follow-up:   [name] Bell was discharged from [facility] in [place] condition.  At the hospital follow up visit please address:  1.  Acute upper GI bleed due to gastritis: Please obtain CBC      COPD/moderate size pleural effusions/protein calorie malnutrition: Ensure compliance with new supplemental oxygen at 3 L.  She declined SNF placement  2.  Labs / imaging needed at time of follow-up:  CBC  3.  Pending labs/ test needing follow-up: None  Follow-up Appointments: Follow-up Information    [name] Anis, MD Follow up on 05/01/2019.   Specialty: Internal Medicine Why: The clinic will call you to schedule a time.  Contact information: [address] [address] [place] Follow up.   Why: The office will call to schedule visits Contact information: OFFICE LOCATION [address] [place], [place] [address] PHONE NUMBER [phone]          Hospital Course by problem list: 1.  Acute upper GI bleeding due to gastritis       Severe iron deficiency anemia [name] is a 74 year old person with medical history significant for depression, ischemic colitis, duodenal ulcer, anxiety, prior perforated diverticula status post colostomy and has been living with living with left-sided her left-sided colostomy since 2013.  She was admitted for severe symptomatic anemia and was found to have a hemoglobin of 3.8 on admission.  In total, she received 3 unit PRBC transfusion.  She underwent an EGD which showed benign-appearing esophageal stenosis, acute gastritis and nonbleeding gastric ulcer with pigmented material.  During her stay, her hemoglobin stabilized and at discharge hemoglobin was 11.2.  She was subsequently discharged on omeprazole and to continue her home ferrous gluconate.  In addition, she was found to have severe iron deficiency anemia with ferritin of 5, iron saturation of 10.     2.  COPD: Longstanding and now with new oxygen requirement.  She was discharged on supplemental oxygen at 3 L nasal cannula and will have to continue inhalers   3.  Moderate size pleural effusions:      Severe protein calorie malnutrition She was found to have moderate size bilateral pleural effusions which is likely a complication of her severe protein calorie malnutrition as she was found to have an albumin of 2.2.  It was elected not to  perform bilateral thoracentesis given her frailty and severe comorbidities   4.  Deconditioning: Physical therapy had recommended for skilled nursing facility placement however she declined.    Discharge Vitals:   BP (!) 122/49   Pulse (!) 115   Temp 98.3 F (36.8 C) (Oral)   Resp 20   Ht 5\' 3"  (1.6 m)   Wt 59.6 kg   LMP 08/25/1980   SpO2 95%   BMI 23.28 kg/m   Pertinent Labs, Studies, and Procedures:  CBC Latest Ref Rng & Units 04/25/2019 04/24/2019 04/23/2019  WBC 4.0 - 10.5 K/uL 7.9 6.7 7.0  Hemoglobin 12.0 - 15.0 g/dL 11.2(L) 10.5(L) 10.3(L)  Hematocrit 36.0 - 46.0 % 40.3 36.5 35.8(L)  Platelets 150 - 400 K/uL 235 246 251   CMP Latest Ref Rng & Units 04/23/2019 04/22/2019 04/21/2019  Glucose 70 - 99 mg/dL 93 97 87  BUN 8 - 23 mg/dL <5(L) <5(L) <5(L)  Creatinine 0.44 - 1.00 mg/dL 0.40(L) 0.43(L) 0.49  Sodium 135 - 145 mmol/L 141 140 139  Potassium 3.5 - 5.1 mmol/L 3.0(L) 3.2(L) 3.3(L)  Chloride 98 - 111 mmol/L 105 102 101  CO2 22 - 32 mmol/L 29 27 27   Calcium 8.9 - 10.3 mg/dL 8.1(L) 8.8(L) 8.5(L)  Total Protein 6.5 - 8.1 g/dL - - 4.1(L)  Total Bilirubin 0.3 - 1.2 mg/dL - - 0.8  Alkaline Phos 38 - 126 U/L - - 80  AST 15 - 41 U/L - - 19  ALT 0 - 44 U/L - - 10    Upper endoscopy Benign-appearing esophageal stenosis. - Esophageal plaques were found, suspicious for candidiasis. Cells for cytology obtained. - Z-line, 35 cm from the incisors. - Acute gastritis. - Normal examined duodenum. - Non-bleeding gastric ulcer with pigmented material.  Impression: - Clear liquid diet. - Await pathology results. - Observe patient's clinical course.  Discharge Instructions: Discharge Instructions    Diet - low sodium heart healthy   Complete by: As directed    Diet - low sodium heart healthy   Complete by: As directed    Diet - low sodium heart healthy   Complete by: As directed    Discharge instructions   Complete by: As directed    You were hospitalized for GI bleed.  Thank you for allowing [place] to be part of your care.   We arranged for you to follow up at: [facility] health Internal Medicine will call you to schedule an appointment.   Please note these changes made to your medications:   Please START taking:  1. Omeprazole 40 mg twice daily for 20 days then just take 40 mg once daily.  2. Restart home medications listed on discharge summary.   Please make sure to follow up with primary care physician  Please call our clinic if you have any questions or concerns, we may be able to help and keep you from a long and expensive emergency room wait. Our clinic and after hours phone number is [phone], the best time to call is Monday through Friday 9 am to 4 pm but there is always someone available 24/7 if you have an emergency. If you need medication refills please notify your pharmacy one week in advance and they will send [place] a request.   Discharge instructions   Complete by: As directed    You were hospitalized for GI bleed. Thank you for allowing [place] to be part of your care.   We arranged for you to follow up at: [facility] Internal Medicine on Monday (04/05). They will call you to make an appointment.    Please note these changes made to your medications:   Please START taking:  1. Omeprazole 1 capsule (40 mg total) by mouth in the morning and at bedtime for 20 days, THEN 1 capsule (40 mg total) daily for 20 days.  Please STOP taking:  See discharge paperwork for medications to discontinue.  Please make sure to follow up with your primary care provider.  Please call our clinic if you have any questions or concerns, we may be able to help and keep you from a long and expensive emergency room wait. Our clinic and after hours phone number is [phone], the best time to call is Monday through Friday 9 am to 4 pm but there is always someone available 24/7 if you have an emergency. If you need medication refills please notify your pharmacy one week in  advance and  they will send [place] a request.   Increase activity slowly   Complete by: As directed    Increase activity slowly   Complete by: As directed    Increase activity slowly   Complete by: As directed       Signed: Jean Rosenthal, MD 04/25/2019, 8:09 PM   Pager: [phone] Internal Medicine Teaching Service

## 2019-04-25 NOTE — Telephone Encounter (Signed)
Currently inpt.

## 2019-04-25 NOTE — Plan of Care (Signed)
  Problem: Activity: Goal: Risk for activity intolerance will decrease Outcome: Progressing   

## 2019-04-26 DIAGNOSIS — J449 Chronic obstructive pulmonary disease, unspecified: Secondary | ICD-10-CM | POA: Diagnosis not present

## 2019-04-26 DIAGNOSIS — H548 Legal blindness, as defined in USA: Secondary | ICD-10-CM | POA: Diagnosis not present

## 2019-04-26 DIAGNOSIS — K559 Vascular disorder of intestine, unspecified: Secondary | ICD-10-CM | POA: Diagnosis not present

## 2019-04-26 DIAGNOSIS — M549 Dorsalgia, unspecified: Secondary | ICD-10-CM | POA: Diagnosis not present

## 2019-04-26 DIAGNOSIS — H353290 Exudative age-related macular degeneration, unspecified eye, stage unspecified: Secondary | ICD-10-CM | POA: Diagnosis not present

## 2019-04-26 DIAGNOSIS — Z933 Colostomy status: Secondary | ICD-10-CM | POA: Diagnosis not present

## 2019-04-26 DIAGNOSIS — K259 Gastric ulcer, unspecified as acute or chronic, without hemorrhage or perforation: Secondary | ICD-10-CM | POA: Diagnosis not present

## 2019-04-26 DIAGNOSIS — Z9181 History of falling: Secondary | ICD-10-CM | POA: Diagnosis not present

## 2019-04-26 DIAGNOSIS — G894 Chronic pain syndrome: Secondary | ICD-10-CM | POA: Diagnosis not present

## 2019-04-26 DIAGNOSIS — D509 Iron deficiency anemia, unspecified: Secondary | ICD-10-CM | POA: Diagnosis not present

## 2019-04-26 DIAGNOSIS — D619 Aplastic anemia, unspecified: Secondary | ICD-10-CM | POA: Diagnosis not present

## 2019-04-26 NOTE — Discharge Summary (Signed)
Name: [name] Bell MRN: [id number] DOB: Dec 27, 1945 74 y.o. PCP: Mosetta Anis, MD  Date of Admission: 04/18/2019 11:34 PM Date of Discharge: 04/25/2019 Attending Physician: Lalla Brothers, MD FACP  Discharge Diagnosis: 1.  Acute upper GI bleeding due to gastritis 2.  COPD 3.  Moderate size pleural effusions 4.  Deconditioning 5.  Severe protein calorie malnutrition  Discharge Medications: Allergies as of 04/25/2019      Reactions   Strawberry (diagnostic) Rash   Duloxetine Nausea And Vomiting   Ibuprofen Other (See Comments)   REACTION: Bleeding ulcers   Nsaids Other (See Comments)   REACTION: Bleeding Ulcer   Latex Itching      Medication List    STOP taking these medications   Eliquis 5 MG Tabs tablet Generic drug: apixaban   esomeprazole 40 MG capsule Commonly known as: NEXIUM   fluticasone 50 MCG/ACT nasal spray Commonly known as: FLONASE   hydrocortisone cream 1 %   witch hazel-glycerin pad Commonly known as: TUCKS     TAKE these medications   albuterol (2.5 MG/3ML) 0.083% nebulizer solution Commonly known as: PROVENTIL Take 3 mLs (2.5 mg total) by nebulization every 6 (six) hours as needed for wheezing or shortness of breath. What changed: Another medication with the same name was changed. Make sure you understand how and when to take each.   albuterol 108 (90 Base) MCG/ACT inhaler Commonly known as: VENTOLIN HFA TAKE 2 PUFFS BY MOUTH EVERY 6 HOURS AS NEEDED FOR WHEEZE OR SHORTNESS OF BREATH What changed: See the new instructions.   cyclobenzaprine 5 MG tablet Commonly known as: FLEXERIL TAKE 1 TABLET BY MOUTH THREE TIMES A DAY AS NEEDED FOR MUSCLE SPASMS What changed: See the new instructions.   docusate sodium 100 MG capsule Commonly known as: Colace Take 1 capsule (100 mg total) by mouth 2 (two) times daily. To prevent constipation while taking pain medication.   DULoxetine 60 MG capsule Commonly known as: CYMBALTA Take 60 mg by mouth  daily.   feeding supplement (BOOST BREEZE) Liqd Take 1 Container by mouth 3 (three) times daily between meals as needed.   ferrous gluconate 324 MG tablet Commonly known as: FERGON TAKE 1 TABLET BY MOUTH DAILY WITH BREAKFAST   furosemide 40 MG tablet Commonly known as: LASIX TAKE 1 TABLET BY MOUTH EVERY DAY   HYDROcodone-acetaminophen 7.5-325 MG tablet Commonly known as: Norco Take 1 tablet by mouth every 6 (six) hours as needed for moderate pain or severe pain.   metoprolol tartrate 25 MG tablet Commonly known as: LOPRESSOR Take 12.5 mg by mouth 2 (two) times daily.   morphine 30 MG 12 hr tablet Commonly known as: MS CONTIN Take 1 tablet (30 mg total) by mouth every 12 (twelve) hours. Give one by mouth every twelve hours as needed for pain   omeprazole 40 MG capsule Commonly known as: PRILOSEC Take 1 capsule (40 mg total) by mouth in the morning and at bedtime for 20 days, THEN 1 capsule (40 mg total) daily for 20 days. Start taking on: April 24, 2019   ondansetron 4 MG tablet Commonly known as: ZOFRAN TAKE 1 TABLET BY MOUTH EVERY 8 HOURS AS NEEDED FOR NAUSEA AND VOMITING What changed: See the new instructions.   potassium chloride 10 MEQ CR capsule Commonly known as: MICRO-K TAKE 1 CAPSULE (10 MEQ TOTAL) BY MOUTH 2 (TWO) TIMES DAILY.   pravastatin 40 MG tablet Commonly known as: PRAVACHOL TAKE 1 TABLET BY MOUTH EVERY DAY   traZODone  50 MG tablet Commonly known as: DESYREL TAKE 0.5 TABLETS (25 MG TOTAL) BY MOUTH 2 (TWO) TIMES DAILY.       Disposition and follow-up:   [name] Bell was discharged from [facility] in [place] condition.  At the hospital follow up visit please address:  1.  Acute upper GI bleed due to gastritis: Please obtain CBC      COPD/moderate size pleural effusions/protein calorie malnutrition: Ensure compliance with new supplemental oxygen at 3 L.  She declined SNF placement  2.  Labs / imaging needed at time of follow-up:  cbc  3.  Pending labs/ test needing follow-up: none  Follow-up Appointments: Follow-up Information    Mosetta Anis, MD Follow up on 05/01/2019.   Specialty: Internal Medicine Why: The clinic will call you to schedule a time.  Contact information: [address] [address] [place] Follow up.   Why: The office will call to schedule visits Contact information: OFFICE LOCATION [address] [place], [place] [address] PHONE NUMBER [phone]          Hospital Course by problem list: 1.  Acute upper GI bleeding due to gastritis       Severe iron deficiency anemia [name] Bell is a 74 year old person with medical history significant for depression, ischemic colitis, duodenal ulcer, anxiety, prior perforated diverticula status post colostomy and has been living with living with left-sided her left-sided colostomy since 2013.  She was admitted for severe symptomatic anemia and was found to have a hemoglobin of 3.8 on admission.  In total, she received 3 unit PRBC transfusion.  She underwent an EGD which showed benign-appearing esophageal stenosis, acute gastritis and nonbleeding gastric ulcer with pigmented material.  During her stay, her hemoglobin stabilized and at discharge hemoglobin was 11.2.  She was subsequently discharged on omeprazole and to continue her home ferrous gluconate.  In addition, she was found to have severe iron deficiency anemia with ferritin of 5, iron saturation of 10.     2.  COPD: Longstanding and now with new oxygen requirement.  She was discharged on supplemental oxygen at 3 L nasal cannula and will have to continue inhalers   3.  Moderate size pleural effusions:      Severe protein calorie malnutrition She was found to have moderate size bilateral pleural effusions which is likely a complication of her severe protein calorie malnutrition as she was found to have an albumin of 2.2.  It was elected not  to perform bilateral thoracentesis given her frailty and severe comorbidities   4.  Deconditioning: Physical therapy had recommended for skilled nursing facility placement however she declined.   Discharge Vitals:   BP (!) 122/49   Pulse (!) 115   Temp 98.3 F (36.8 C) (Oral)   Resp 20   Ht 5\' 3"  (1.6 m)   Wt 59.6 kg   LMP 08/25/1980   SpO2 95%   BMI 23.28 kg/m   Pertinent Labs, Studies, and Procedures:  CBC Latest Ref Rng & Units 04/25/2019 04/24/2019 04/23/2019  WBC 4.0 - 10.5 K/uL 7.9 6.7 7.0  Hemoglobin 12.0 - 15.0 g/dL 11.2(L) 10.5(L) 10.3(L)  Hematocrit 36.0 - 46.0 % 40.3 36.5 35.8(L)  Platelets 150 - 400 K/uL 235 246 251   CMP Latest Ref Rng & Units 04/23/2019 04/22/2019 04/21/2019  Glucose 70 - 99 mg/dL 93 97 87  BUN 8 - 23 mg/dL <5(L) <5(L) <5(L)  Creatinine 0.44 - 1.00 mg/dL 0.40(L) 0.43(L) 0.49  Sodium 135 - 145 mmol/L 141 140 139  Potassium 3.5 - 5.1 mmol/L 3.0(L) 3.2(L) 3.3(L)  Chloride 98 - 111 mmol/L 105 102 101  CO2 22 - 32 mmol/L 29 27 27   Calcium 8.9 - 10.3 mg/dL 8.1(L) 8.8(L) 8.5(L)  Total Protein 6.5 - 8.1 g/dL - - 4.1(L)  Total Bilirubin 0.3 - 1.2 mg/dL - - 0.8  Alkaline Phos 38 - 126 U/L - - 80  AST 15 - 41 U/L - - 19  ALT 0 - 44 U/L - - 10   Upper endoscopy Benign-appearing esophageal stenosis. - Esophageal plaques were found, suspicious for candidiasis. Cells for cytology obtained. - Z-line, 35 cm from the incisors. - Acute gastritis. - Normal examined duodenum. - Non-bleeding gastric ulcer with pigmented material.  Impression: - Clear liquid diet. - Await pathology results. - Observe patient's clinical course.  Discharge Instructions: Discharge Instructions    Diet - low sodium heart healthy   Complete by: As directed    Diet - low sodium heart healthy   Complete by: As directed    Diet - low sodium heart healthy   Complete by: As directed    Discharge instructions   Complete by: As directed    You were hospitalized for GI bleed.  Thank you for allowing [place] to be part of your care.   We arranged for you to follow up at: [facility] health Internal Medicine will call you to schedule an appointment.   Please note these changes made to your medications:   Please START taking:  1. Omeprazole 40 mg twice daily for 20 days then just take 40 mg once daily.  2. Restart home medications listed on discharge summary.   Please make sure to follow up with primary care physician  Please call our clinic if you have any questions or concerns, we may be able to help and keep you from a long and expensive emergency room wait. Our clinic and after hours phone number is [phone], the best time to call is Monday through Friday 9 am to 4 pm but there is always someone available 24/7 if you have an emergency. If you need medication refills please notify your pharmacy one week in advance and they will send [place] a request.   Discharge instructions   Complete by: As directed    You were hospitalized for GI bleed. Thank you for allowing [place] to be part of your care.   We arranged for you to follow up at: [facility] Internal Medicine on Monday (04/05). They will call you to make an appointment.    Please note these changes made to your medications:   Please START taking:  1. Omeprazole 1 capsule (40 mg total) by mouth in the morning and at bedtime for 20 days, THEN 1 capsule (40 mg total) daily for 20 days.  Please STOP taking:  See discharge paperwork for medications to discontinue.  Please make sure to follow up with your primary care provider.  Please call our clinic if you have any questions or concerns, we may be able to help and keep you from a long and expensive emergency room wait. Our clinic and after hours phone number is [phone], the best time to call is Monday through Friday 9 am to 4 pm but there is always someone available 24/7 if you have an emergency. If you need medication refills please notify your pharmacy one week in  advance and they  will send [place] a request.   Increase activity slowly   Complete by: As directed    Increase activity slowly   Complete by: As directed    Increase activity slowly   Complete by: As directed       Signed: Marianna Payment, MD 04/26/2019, 11:13 AM   Pager: [phone]

## 2019-04-27 ENCOUNTER — Telehealth: Payer: Self-pay | Admitting: Internal Medicine

## 2019-04-27 NOTE — Telephone Encounter (Signed)
Yes agree with continuing PT services 2 times a week for 4 weeks.

## 2019-04-27 NOTE — Telephone Encounter (Signed)
[facility] PT Requesting VO to Continue PT services for 2 times a week for 4 weeks.  Please call back.

## 2019-04-27 NOTE — Telephone Encounter (Signed)
RTC to [employer] with [facility], Would like to continue PT with patient  2X/week for 4 weeks VO given Will forward to PCP for agreement or denial SChaplin, RN,BSN

## 2019-04-28 DIAGNOSIS — Z933 Colostomy status: Secondary | ICD-10-CM | POA: Diagnosis not present

## 2019-04-28 DIAGNOSIS — K559 Vascular disorder of intestine, unspecified: Secondary | ICD-10-CM | POA: Diagnosis not present

## 2019-04-28 DIAGNOSIS — K259 Gastric ulcer, unspecified as acute or chronic, without hemorrhage or perforation: Secondary | ICD-10-CM | POA: Diagnosis not present

## 2019-04-28 DIAGNOSIS — D509 Iron deficiency anemia, unspecified: Secondary | ICD-10-CM | POA: Diagnosis not present

## 2019-04-28 DIAGNOSIS — M549 Dorsalgia, unspecified: Secondary | ICD-10-CM | POA: Diagnosis not present

## 2019-04-28 DIAGNOSIS — H353290 Exudative age-related macular degeneration, unspecified eye, stage unspecified: Secondary | ICD-10-CM | POA: Diagnosis not present

## 2019-04-28 DIAGNOSIS — H548 Legal blindness, as defined in USA: Secondary | ICD-10-CM | POA: Diagnosis not present

## 2019-04-28 DIAGNOSIS — Z9181 History of falling: Secondary | ICD-10-CM | POA: Diagnosis not present

## 2019-04-28 DIAGNOSIS — J449 Chronic obstructive pulmonary disease, unspecified: Secondary | ICD-10-CM | POA: Diagnosis not present

## 2019-04-28 DIAGNOSIS — G894 Chronic pain syndrome: Secondary | ICD-10-CM | POA: Diagnosis not present

## 2019-04-28 DIAGNOSIS — D619 Aplastic anemia, unspecified: Secondary | ICD-10-CM | POA: Diagnosis not present

## 2019-04-29 ENCOUNTER — Emergency Department (HOSPITAL_COMMUNITY)
Admission: EM | Admit: 2019-04-29 | Discharge: 2019-05-02 | Disposition: A | Discharge status: Other Acute Inpatient Hospital | Payer: Medicare Other | Attending: Emergency Medicine | Admitting: Emergency Medicine

## 2019-04-29 ENCOUNTER — Encounter (HOSPITAL_COMMUNITY): Payer: Self-pay

## 2019-04-29 ENCOUNTER — Other Ambulatory Visit: Payer: Self-pay

## 2019-04-29 DIAGNOSIS — Z79899 Other long term (current) drug therapy: Secondary | ICD-10-CM | POA: Diagnosis not present

## 2019-04-29 DIAGNOSIS — D509 Iron deficiency anemia, unspecified: Secondary | ICD-10-CM | POA: Diagnosis not present

## 2019-04-29 DIAGNOSIS — Z933 Colostomy status: Secondary | ICD-10-CM | POA: Diagnosis not present

## 2019-04-29 DIAGNOSIS — H353290 Exudative age-related macular degeneration, unspecified eye, stage unspecified: Secondary | ICD-10-CM | POA: Diagnosis not present

## 2019-04-29 DIAGNOSIS — G894 Chronic pain syndrome: Secondary | ICD-10-CM | POA: Diagnosis not present

## 2019-04-29 DIAGNOSIS — Z743 Need for continuous supervision: Secondary | ICD-10-CM | POA: Diagnosis not present

## 2019-04-29 DIAGNOSIS — Z9981 Dependence on supplemental oxygen: Secondary | ICD-10-CM | POA: Diagnosis not present

## 2019-04-29 DIAGNOSIS — J449 Chronic obstructive pulmonary disease, unspecified: Secondary | ICD-10-CM | POA: Diagnosis not present

## 2019-04-29 DIAGNOSIS — H548 Legal blindness, as defined in USA: Secondary | ICD-10-CM | POA: Diagnosis not present

## 2019-04-29 DIAGNOSIS — R269 Unspecified abnormalities of gait and mobility: Secondary | ICD-10-CM | POA: Diagnosis not present

## 2019-04-29 DIAGNOSIS — Z20822 Contact with and (suspected) exposure to covid-19: Secondary | ICD-10-CM | POA: Diagnosis not present

## 2019-04-29 DIAGNOSIS — Z9181 History of falling: Secondary | ICD-10-CM | POA: Diagnosis not present

## 2019-04-29 DIAGNOSIS — M549 Dorsalgia, unspecified: Secondary | ICD-10-CM | POA: Diagnosis not present

## 2019-04-29 DIAGNOSIS — K259 Gastric ulcer, unspecified as acute or chronic, without hemorrhage or perforation: Secondary | ICD-10-CM | POA: Diagnosis not present

## 2019-04-29 DIAGNOSIS — K559 Vascular disorder of intestine, unspecified: Secondary | ICD-10-CM | POA: Diagnosis not present

## 2019-04-29 DIAGNOSIS — R0902 Hypoxemia: Secondary | ICD-10-CM | POA: Diagnosis not present

## 2019-04-29 DIAGNOSIS — Z87891 Personal history of nicotine dependence: Secondary | ICD-10-CM | POA: Diagnosis not present

## 2019-04-29 DIAGNOSIS — D619 Aplastic anemia, unspecified: Secondary | ICD-10-CM | POA: Diagnosis not present

## 2019-04-29 MED ORDER — METOPROLOL TARTRATE 25 MG PO TABS
12.5000 mg | ORAL_TABLET | Freq: Two times a day (BID) | ORAL | Status: DC
  Administered 2019-04-30 – 2019-05-02 (×5): 12.5 mg via ORAL
  Filled 2019-04-29 (×5): qty 1

## 2019-04-29 MED ORDER — PANTOPRAZOLE SODIUM 40 MG PO TBEC
40.0000 mg | DELAYED_RELEASE_TABLET | Freq: Every day | ORAL | Status: DC
  Administered 2019-04-30 – 2019-05-02 (×3): 40 mg via ORAL
  Filled 2019-04-29 (×3): qty 1

## 2019-04-29 MED ORDER — FUROSEMIDE 20 MG PO TABS
40.0000 mg | ORAL_TABLET | Freq: Every day | ORAL | Status: DC
  Administered 2019-04-30 – 2019-05-02 (×3): 40 mg via ORAL
  Filled 2019-04-29 (×3): qty 2

## 2019-04-29 MED ORDER — MORPHINE SULFATE ER 30 MG PO TBCR
30.0000 mg | EXTENDED_RELEASE_TABLET | Freq: Two times a day (BID) | ORAL | Status: DC
  Administered 2019-04-30 – 2019-05-02 (×6): 30 mg via ORAL
  Filled 2019-04-29: qty 1
  Filled 2019-04-29 (×2): qty 2
  Filled 2019-04-29: qty 1
  Filled 2019-04-29 (×2): qty 2

## 2019-04-29 MED ORDER — FERROUS GLUCONATE 324 (38 FE) MG PO TABS
324.0000 mg | ORAL_TABLET | Freq: Every day | ORAL | Status: DC
  Administered 2019-04-30 – 2019-05-02 (×3): 324 mg via ORAL
  Filled 2019-04-29 (×3): qty 1

## 2019-04-29 MED ORDER — DULOXETINE HCL 60 MG PO CPEP
60.0000 mg | ORAL_CAPSULE | Freq: Every day | ORAL | Status: DC
  Administered 2019-04-30 – 2019-05-02 (×3): 60 mg via ORAL
  Filled 2019-04-29 (×3): qty 1

## 2019-04-29 MED ORDER — ALBUTEROL SULFATE HFA 108 (90 BASE) MCG/ACT IN AERS: 2.0000 | INHALATION_SPRAY | Freq: Four times a day (QID) | RESPIRATORY_TRACT | Status: DC | PRN

## 2019-04-29 MED ORDER — POTASSIUM CHLORIDE CRYS ER 20 MEQ PO TBCR
20.0000 meq | EXTENDED_RELEASE_TABLET | Freq: Every day | ORAL | Status: DC
  Administered 2019-04-30 – 2019-05-02 (×3): 20 meq via ORAL
  Filled 2019-04-29 (×3): qty 1

## 2019-04-29 MED ORDER — PRAVASTATIN SODIUM 40 MG PO TABS
40.0000 mg | ORAL_TABLET | Freq: Every day | ORAL | Status: DC
  Administered 2019-04-30 – 2019-05-02 (×3): 40 mg via ORAL
  Filled 2019-04-29 (×3): qty 1

## 2019-04-29 MED ORDER — TRAZODONE HCL 50 MG PO TABS
25.0000 mg | ORAL_TABLET | Freq: Two times a day (BID) | ORAL | Status: DC
  Administered 2019-04-30 – 2019-05-02 (×6): 25 mg via ORAL
  Filled 2019-04-29 (×6): qty 1

## 2019-04-29 MED ORDER — HYDROCODONE-ACETAMINOPHEN 7.5-325 MG PO TABS
1.0000 | ORAL_TABLET | Freq: Four times a day (QID) | ORAL | Status: DC | PRN
  Administered 2019-05-01: 1 via ORAL
  Filled 2019-04-29: qty 1

## 2019-04-29 MED ORDER — DOCUSATE SODIUM 100 MG PO CAPS
100.0000 mg | ORAL_CAPSULE | Freq: Two times a day (BID) | ORAL | Status: DC
  Administered 2019-04-30 – 2019-05-01 (×4): 100 mg via ORAL
  Filled 2019-04-29 (×6): qty 1

## 2019-04-29 MED ORDER — ONDANSETRON HCL 4 MG PO TABS
4.0000 mg | ORAL_TABLET | Freq: Three times a day (TID) | ORAL | Status: DC | PRN
  Administered 2019-04-30 (×2): 4 mg via ORAL
  Filled 2019-04-29 (×2): qty 1

## 2019-04-29 NOTE — Progress Notes (Signed)
   04/29/19 1859  TOC ED Mini Assessment  TOC Time spent with patient (minutes): 50  PING Used in TOC Assessment No  Admission or Readmission Diverted No  Interventions which prevented an admission or readmission SNF Placement  What brought you to the Emergency Department?  Oxygen tank not working,  Barriers to Discharge Equipment Delay;Ship broker;No SNF bed  Barrier interventions Current plan refer to SNF, equipment issues resolved with exception of no social support to receive and assist setting up equipment at home  Means of departure Not know  Key Contact 1 Denies  Patient states their goals for this hospitalization and ongoing recovery are: "I want to get strong again to take care of myself at home."  CMS Medicare.gov Compare Post Acute Care list provided to: Patient  Choice offered to / list presented to  Patient

## 2019-04-29 NOTE — ED Notes (Signed)
[name] Bell with [employer] [phone] returned call and stated he was in [place] and that the company does not service [place].

## 2019-04-29 NOTE — ED Triage Notes (Signed)
This nurse called [facility], LM to return my call.
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
## 2019-04-29 NOTE — ED Triage Notes (Signed)
To triage via EMS from home.  Pt has been waiting on oxygen tank company to bring her a new oxygen tank as hers was not working properly since last night.  EMS attempted to call company several times, was told they were unable to say when the delivery would be made other than "it is out for delivery".  Pts oxygen sats dropped to low 80's and pt became nauseous, then EMS put pt back on their oxygen and oxygen sats increased to upper 90's.  On 3L oxygen.    EMS also reports that pt has this constant weeping on legs.  Pt said it has been there for years, she is soaking through paper towels and pt said when she stands up it runs down into her shoe.    [place] [facility] # [phone]

## 2019-04-29 NOTE — ED Notes (Signed)
Return call from on call tech at [facility] medical Svc.  This tech is in [facility] and he states he has been trying to get in touch with his manager because he keeps getting this service call.  The [facility] [place] on call tech needs to handle.  I asked this tech to call manager and find out how and when this matter will be handled and to call me back.  Tech acknowledged.

## 2019-04-30 DIAGNOSIS — Z9981 Dependence on supplemental oxygen: Secondary | ICD-10-CM | POA: Diagnosis not present

## 2019-04-30 LAB — SARS CORONAVIRUS 2 (TAT 6-24 HRS): SARS Coronavirus 2: NEGATIVE

## 2019-04-30 MED ORDER — BOOST / RESOURCE BREEZE PO LIQD CUSTOM
1.0000 | Freq: Three times a day (TID) | ORAL | Status: DC
  Administered 2019-05-01 – 2019-05-02 (×2): 1 via ORAL
  Filled 2019-04-30 (×4): qty 1

## 2019-04-30 NOTE — ED Notes (Signed)
Emptied colostomy bag. Approx 300 ml output

## 2019-04-30 NOTE — NC FL2 (Signed)
[place] MEDICAID FL2 LEVEL OF CARE SCREENING TOOL     IDENTIFICATION  Patient Name: [name] Bell Birthdate: Nov 25, 1945 Sex: female Admission Date (Current Location): 04/29/2019  [place] and [place] Number:  Kathleen Argue [id number] and Address:  [facility] [place], [place] [address] [place], [place] [address]      Provider Number: [id number]  Attending Physician Name and Address:  No att. providers found  Relative Name and Phone Number:  [name], friend [phone]    Current Level of Care: Hospital Recommended Level of Care: [place] Prior Approval Number:    Date Approved/Denied: 08/04/11 PASRR Number: [id number]  Discharge Plan: SNF    Current Diagnoses: Patient Active Problem List   Diagnosis Date Noted  . Symptomatic anemia 04/19/2019  . Frailty 04/19/2019  . External hemorrhoids without complication [id number]  . Swelling of lower extremity 08/10/2018  . Pain due to onychomycosis of toenails of both feet 08/03/2018  . At risk for adverse drug reaction 03/29/2018  . Malnutrition of moderate degree 03/28/2018  . Duodenal ulcer disease 07/27/2017  . Hepatic steatosis 07/03/2017  . Aortic atherosclerosis ([place]) 06/04/2017  . Iron deficiency anemia 06/04/2017  . History of ischemic colitis 06/03/2017  . S/P sigmoid colectomy 06/03/2017  . Back pain, chronic 08/14/2016  . Hypokalemia 03/31/2016  . Macular degeneration 01/14/2012  . Preventative health care 01/14/2012  . COPD (chronic obstructive pulmonary disease) ([place]) 11/25/2011  . Colostomy present ([place]) 10/27/2011  . Hyperlipidemia 01/24/2009  . Long term (current) use of opiate analgesic 01/03/2009  . Major depression, chronic 03/08/2008  . Osteoporosis 03/03/2007    Orientation RESPIRATION BLADDER Height & Weight     Self, Time, Situation, Place  O2 Incontinent Weight:   Height:     BEHAVIORAL SYMPTOMS/MOOD NEUROLOGICAL BOWEL NUTRITION STATUS   Continent Diet  AMBULATORY STATUS COMMUNICATION OF NEEDS Skin   Extensive Assist Verbally Normal                       Personal Care Assistance Level of Assistance  Bathing, Dressing, Feeding Bathing Assistance: Maximum assistance Feeding assistance: Limited assistance Dressing Assistance: Maximum assistance     Functional Limitations Info  Sight, Hearing, Speech Sight Info: Impaired Hearing Info: Adequate Speech Info: Adequate    SPECIAL CARE FACTORS FREQUENCY  PT (By licensed PT), OT (By licensed OT)     PT Frequency: 5x weekly OT Frequency: 5x weekly            Contractures Contractures Info: Not present    Additional Factors Info  Code Status, Allergies Code Status Info: Full Allergies Info: Strawberry, duloxetine, ibuprofen, NSAIDs, latex           Current Medications (04/30/2019):  This is the current hospital active medication list Current Facility-Administered Medications  Medication Dose Route Frequency Provider Last Rate Last Admin  . albuterol (VENTOLIN HFA) 108 (90 Base) MCG/ACT inhaler 2 puff  2 puff Inhalation Q6H PRN Virgel Manifold, MD      . docusate sodium (COLACE) capsule 100 mg  100 mg Oral BID Virgel Manifold, MD   100 mg at 04/30/19 [id number]  . DULoxetine (CYMBALTA) DR capsule 60 mg  60 mg Oral Daily Virgel Manifold, MD   60 mg at 04/30/19 0924  . ferrous gluconate (FERGON) tablet 324 mg  324 mg Oral Q breakfast Virgel Manifold, MD   324 mg at 04/30/19 [id number]  . furosemide (LASIX) tablet 40 mg  40 mg Oral Daily Virgel Manifold,  MD   40 mg at 04/30/19 0925  . HYDROcodone-acetaminophen (NORCO) 7.5-325 MG per tablet 1 tablet  1 tablet Oral Q6H PRN Virgel Manifold, MD      . metoprolol tartrate (LOPRESSOR) tablet 12.5 mg  12.5 mg Oral BID Virgel Manifold, MD   12.5 mg at 04/30/19 [id number]  . morphine (MS CONTIN) 12 hr tablet 30 mg  30 mg Oral Q12H Virgel Manifold, MD   30 mg at 04/30/19 0924  . ondansetron (ZOFRAN) tablet 4 mg  4 mg Oral Q8H PRN Virgel Manifold,  MD   4 mg at 04/30/19 0039  . pantoprazole (PROTONIX) EC tablet 40 mg  40 mg Oral Daily Virgel Manifold, MD   40 mg at 04/30/19 0925  . potassium chloride SA (KLOR-CON) CR tablet 20 mEq  20 mEq Oral Daily Virgel Manifold, MD   20 mEq at 04/30/19 0924  . pravastatin (PRAVACHOL) tablet 40 mg  40 mg Oral Daily Virgel Manifold, MD   40 mg at 04/30/19 0925  . traZODone (DESYREL) tablet 25 mg  25 mg Oral BID Virgel Manifold, MD   25 mg at 04/30/19 [id number]   Current Outpatient Medications  Medication Sig Dispense Refill  . albuterol (PROVENTIL) (2.5 MG/3ML) 0.083% nebulizer solution Take 3 mLs (2.5 mg total) by nebulization every 6 (six) hours as needed for wheezing or shortness of breath. 75 mL 12  . albuterol (VENTOLIN HFA) 108 (90 Base) MCG/ACT inhaler TAKE 2 PUFFS BY MOUTH EVERY 6 HOURS AS NEEDED FOR WHEEZE OR SHORTNESS OF BREATH (Patient taking differently: Inhale 2 puffs into the lungs every 6 (six) hours as needed for wheezing or shortness of breath. ) 18 g 1  . cyclobenzaprine (FLEXERIL) 5 MG tablet TAKE 1 TABLET BY MOUTH THREE TIMES A DAY AS NEEDED FOR MUSCLE SPASMS (Patient taking differently: Take 5 mg by mouth 3 (three) times daily as needed for muscle spasms. ) 90 tablet 0  . DULoxetine (CYMBALTA) 60 MG capsule Take 60 mg by mouth daily.    . ferrous gluconate (FERGON) 324 MG tablet TAKE 1 TABLET BY MOUTH DAILY WITH BREAKFAST (Patient taking differently: Take 324 mg by mouth daily with breakfast. ) 90 tablet 3  . furosemide (LASIX) 40 MG tablet TAKE 1 TABLET BY MOUTH EVERY DAY (Patient taking differently: Take 40 mg by mouth daily. ) 90 tablet 1  . HYDROcodone-acetaminophen (NORCO) 7.5-325 MG tablet Take 1 tablet by mouth every 6 (six) hours as needed for moderate pain or severe pain. 81 tablet 0  . metoprolol tartrate (LOPRESSOR) 25 MG tablet Take 12.5 mg by mouth 2 (two) times daily.     Marland Kitchen morphine (MS CONTIN) 30 MG 12 hr tablet Take 1 tablet (30 mg total) by mouth every 12 (twelve) hours. Give one  by mouth every twelve hours as needed for pain 60 tablet 0  . Multiple Vitamin (MULTIVITAMIN WITH MINERALS) TABS tablet Take 1 tablet by mouth daily.    . Nutritional Supplements (FEEDING SUPPLEMENT, BOOST BREEZE,) LIQD Take 1 Container by mouth 3 (three) times daily between meals as needed. 237 mL 5  . omeprazole (PRILOSEC) 40 MG capsule Take 1 capsule (40 mg total) by mouth in the morning and at bedtime for 20 days, THEN 1 capsule (40 mg total) daily for 20 days. 60 capsule 0  . ondansetron (ZOFRAN) 4 MG tablet TAKE 1 TABLET BY MOUTH EVERY 8 HOURS AS NEEDED FOR NAUSEA AND VOMITING (Patient taking differently: Take 4 mg by mouth every 8 (eight) hours as  needed for nausea or vomiting. ) 90 tablet 1  . potassium chloride (MICRO-K) 10 MEQ CR capsule TAKE 1 CAPSULE (10 MEQ TOTAL) BY MOUTH 2 (TWO) TIMES DAILY. 180 capsule 1  . pravastatin (PRAVACHOL) 40 MG tablet TAKE 1 TABLET BY MOUTH EVERY DAY (Patient taking differently: Take 40 mg by mouth daily. ) 90 tablet 3  . traZODone (DESYREL) 50 MG tablet TAKE 0.5 TABLETS (25 MG TOTAL) BY MOUTH 2 (TWO) TIMES DAILY. 90 tablet 1  . docusate sodium (COLACE) 100 MG capsule Take 1 capsule (100 mg total) by mouth 2 (two) times daily. To prevent constipation while taking pain medication. (Patient not taking: Reported on 04/30/2019) 40 capsule 0     Discharge Medications: Please see discharge summary for a list of discharge medications.  Relevant Imaging Results:  Relevant Lab Results:   Additional Information SS#: [id number]  Oretha Milch, LCSW

## 2019-04-30 NOTE — ED Notes (Signed)
Pt arrived to Rm 49 via bed - Pt noted to be alert, oriented - aware of and in agreement w/tx plan - SNF Placement. O2 infusing @ 3L/min and PureWick intact. Pt has her belongings and cane on bed. Coffee given as requested and breakfast ordered.

## 2019-04-30 NOTE — ED Notes (Signed)
PT in w/pt - woke pt.

## 2019-04-30 NOTE — ED Notes (Signed)
Pt voices appreciation about being here in hospital rather than at home alone. Also voicing appreciation of staff.

## 2019-04-30 NOTE — ED Notes (Signed)
Pt bathed - bil legs noted to be weeping - clear fluid - no odor. States this is chronic. Pt eating lunch.

## 2019-04-30 NOTE — Evaluation (Signed)
Physical Therapy Evaluation Patient Details Name: [name] Bell MRN: [id number] DOB: Apr 29, 1945 Today's Date: 04/30/2019   History of Present Illness  74 year old female presenting to the emergency room 04/29/19 because she ran out of her home oxygen and she could not get company supplying her O2 to come out. Recently discharged from hospital to home 04/26/19 as she refused SNF placement. PMH includes: colostomy, COPD on home O2, legally blind, weeping of bil LEs   Clinical Impression   Pt seen in ED. Patient returned to hospital after ~3 days at home due to oxygen supply issue, however now realizes she cannot care for herself at home as she used to and wants to get stronger by working with therapy. (Recent hospitalization therapy had recommended discharge to SNF with pt refusing). She required overall moderate assist for mobility due to weakness, limited ROM bil LEs due to edema, impaired balance, and decreased knowledge of use of DME. Pt currently with functional limitations due to the deficits listed below (see PT Problem List). Pt will benefit from skilled PT to increase their independence and safety with mobility to allow discharge to the venue listed below.       Follow Up Recommendations SNF    Equipment Recommendations  Other (comment)(TBD at next venue)    Recommendations for Other Services       Precautions / Restrictions Precautions Precautions: Fall Precaution Comments: reports she fell once and 1 near fall since return home 3/31      Mobility  Bed Mobility Overal bed mobility: Needs Assistance Bed Mobility: Rolling;Sidelying to Sit Rolling: Min assist Sidelying to sit: Mod assist;HOB elevated       General bed mobility comments: incr time and cues; +rail; once feet over EOB, HOB further raised to assist with coming to sitting due to sore ribs  Transfers Overall transfer level: Needs assistance Equipment used: None Transfers: Sit to/from Stand Sit to Stand: Mod  assist         General transfer comment: assist for anterior wt-shift over feet and then boost to extend hips and knees to stand  Ambulation/Gait             General Gait Details: unable due to no absorbent undergarments available in ED (pt also very fearful of falling)  Stairs            Wheelchair Mobility    Modified Rankin (Stroke Patients Only)       Balance Overall balance assessment: Needs assistance Sitting-balance support: Bilateral upper extremity supported;Feet supported;Feet unsupported Sitting balance-Leahy Scale: Poor Sitting balance - Comments: occasional sudden LOB posteriorly--feels she is being pushed backwards Postural control: Posterior lean Standing balance support: Bilateral upper extremity supported Standing balance-Leahy Scale: Poor                               Pertinent Vitals/Pain Pain Assessment: Faces Faces Pain Scale: Hurts little more Pain Location: my ribs Pain Descriptors / Indicators: Sore Pain Intervention(s): Limited activity within patient's tolerance;Monitored during session;Repositioned    Home Living Family/patient expects to be discharged to:: Skilled nursing facility                      Prior Function Level of Independence: Needs assistance   Gait / Transfers Assistance Needed: walking with walker or cane  ADL's / Homemaking Assistance Needed: since return home was requiring assist of aid for self care  Hand Dominance   Dominant Hand: Right    Extremity/Trunk Assessment   Upper Extremity Assessment Upper Extremity Assessment: Generalized weakness    Lower Extremity Assessment Lower Extremity Assessment: Generalized weakness;RLE deficits/detail;LLE deficits/detail RLE Deficits / Details: rt thigh very tight with edema without weeping; lower legs with dried/old drainage LLE Deficits / Details: lt thigh less tight with edema without weeping; lower legs with dried/old drainage     Cervical / Trunk Assessment Cervical / Trunk Assessment: Kyphotic;Other exceptions Cervical / Trunk Exceptions: colostomy  Communication   Communication: No difficulties  Cognition Arousal/Alertness: Awake/alert Behavior During Therapy: WFL for tasks assessed/performed Overall Cognitive Status: No family/caregiver present to determine baseline cognitive functioning                                 General Comments: Patient now realizes she cannot manage at home and describes poor ability to care for herself with urinary incontinence and weeping legs       General Comments General comments (skin integrity, edema, etc.): On arrival, purewick working to collect urine, however bed pad also wet with urine. NT in to assist with chaning pads and sheets while pt stood ~90 seconds. Pt sitting at EOB working on bathing with NT at end of session    Exercises Other Exercises Other Exercises: seated knee flexion, LAQ x 5 each leg while waiting for NT to gather needed supplies   Assessment/Plan    PT Assessment Patient needs continued PT services  PT Problem List Decreased strength;Decreased mobility;Decreased safety awareness;Decreased activity tolerance;Pain;Decreased range of motion;Decreased balance;Decreased knowledge of use of DME;Decreased skin integrity       PT Treatment Interventions DME instruction;Therapeutic exercise;Gait training;Functional mobility training;Therapeutic activities;Patient/family education    PT Goals (Current goals can be found in the Care Plan section)  Acute Rehab PT Goals Patient Stated Goal: to get stronger and be able to care for herself PT Goal Formulation: With patient Time For Goal Achievement: 05/14/19 Potential to Achieve Goals: Good    Frequency Min 2X/week   Barriers to discharge Decreased caregiver support      Co-evaluation               AM-PAC PT "6 Clicks" Mobility  Outcome Measure Help needed turning from your back  to your side while in a flat bed without using bedrails?: A Lot Help needed moving from lying on your back to sitting on the side of a flat bed without using bedrails?: A Lot Help needed moving to and from a bed to a chair (including a wheelchair)?: A Lot Help needed standing up from a chair using your arms (e.g., wheelchair or bedside chair)?: A Lot Help needed to walk in hospital room?: Total Help needed climbing 3-5 steps with a railing? : Total 6 Click Score: 10    End of Session   Activity Tolerance: Treatment limited secondary to medical complications (Comment)(urinary incontinence and fear of falling) Patient left: in bed;with nursing/sitter in room(sitting EOB bathing with NT) Nurse Communication: Mobility status PT Visit Diagnosis: Muscle weakness (generalized) (M62.81);Difficulty in walking, not elsewhere classified (R26.2);Other abnormalities of gait and mobility (R26.89);Pain Pain - Right/Left: (bil) Pain - part of body: (ribs)    Time: [id number] PT Time Calculation (min) (ACUTE ONLY): 51 min   Charges:   PT Evaluation $PT Eval Moderate Complexity: 1 Mod PT Treatments $Therapeutic Activity: 23-37 mins         Arby Barrette, PT  Pager [phone]   Rexanne Mano 04/30/2019, 1:20 PM

## 2019-04-30 NOTE — ED Provider Notes (Signed)
Pt holding in ED waiting for placement.  Home meds ordered.  Vitals stable.   Dorie Rank, MD 04/30/19 [phone]

## 2019-05-01 ENCOUNTER — Encounter: Payer: Medicare Other | Admitting: Internal Medicine

## 2019-05-01 DIAGNOSIS — Z9981 Dependence on supplemental oxygen: Secondary | ICD-10-CM | POA: Diagnosis not present

## 2019-05-01 NOTE — ED Notes (Signed)
SNF-[facility]/ dinner ordered

## 2019-05-01 NOTE — Progress Notes (Addendum)
2pm: CSW spoke with Janett Billow at [facility] who states the patient's insurance authorization is still under review at this time.  9:45am: CSW spoke with [place] at [place] who offered this patient a bed. CSW will initiate insurance authorization.  8am: CSW attempted to reach Tanya in admissions at [facility] - a voicemail was left requesting a return call. Per TOC handoff, [facility] is the patient's first choice for SNF placement.  Madilyn Fireman, MSW, LCSW-A Transitions of Care  Clinical Social Worker  [facility] Emergency Departments  Medical ICU [phone]

## 2019-05-01 NOTE — Progress Notes (Signed)
Auth # was received after 5 pm [id number] 3 day auth starting 05/01/19 end 05/03/19 Fax # [phone]

## 2019-05-02 ENCOUNTER — Non-Acute Institutional Stay (SKILLED_NURSING_FACILITY): Payer: Medicare Other | Admitting: Adult Health

## 2019-05-02 ENCOUNTER — Encounter: Payer: Self-pay | Admitting: Adult Health

## 2019-05-02 ENCOUNTER — Telehealth: Payer: Self-pay

## 2019-05-02 DIAGNOSIS — J418 Mixed simple and mucopurulent chronic bronchitis: Secondary | ICD-10-CM | POA: Diagnosis not present

## 2019-05-02 DIAGNOSIS — D649 Anemia, unspecified: Secondary | ICD-10-CM | POA: Diagnosis not present

## 2019-05-02 DIAGNOSIS — G894 Chronic pain syndrome: Secondary | ICD-10-CM | POA: Diagnosis not present

## 2019-05-02 DIAGNOSIS — K2900 Acute gastritis without bleeding: Secondary | ICD-10-CM

## 2019-05-02 DIAGNOSIS — J449 Chronic obstructive pulmonary disease, unspecified: Secondary | ICD-10-CM | POA: Diagnosis not present

## 2019-05-02 DIAGNOSIS — Z433 Encounter for attention to colostomy: Secondary | ICD-10-CM | POA: Diagnosis not present

## 2019-05-02 DIAGNOSIS — Z87891 Personal history of nicotine dependence: Secondary | ICD-10-CM | POA: Diagnosis not present

## 2019-05-02 DIAGNOSIS — I9589 Other hypotension: Secondary | ICD-10-CM | POA: Diagnosis not present

## 2019-05-02 DIAGNOSIS — Z9189 Other specified personal risk factors, not elsewhere classified: Secondary | ICD-10-CM | POA: Diagnosis not present

## 2019-05-02 DIAGNOSIS — R627 Adult failure to thrive: Secondary | ICD-10-CM | POA: Diagnosis not present

## 2019-05-02 DIAGNOSIS — Z743 Need for continuous supervision: Secondary | ICD-10-CM | POA: Diagnosis not present

## 2019-05-02 DIAGNOSIS — R1312 Dysphagia, oropharyngeal phase: Secondary | ICD-10-CM | POA: Diagnosis not present

## 2019-05-02 DIAGNOSIS — R279 Unspecified lack of coordination: Secondary | ICD-10-CM | POA: Diagnosis not present

## 2019-05-02 DIAGNOSIS — R000 Tachycardia, unspecified: Secondary | ICD-10-CM | POA: Diagnosis not present

## 2019-05-02 DIAGNOSIS — J90 Pleural effusion, not elsewhere classified: Secondary | ICD-10-CM | POA: Diagnosis not present

## 2019-05-02 DIAGNOSIS — E43 Unspecified severe protein-calorie malnutrition: Secondary | ICD-10-CM | POA: Diagnosis not present

## 2019-05-02 DIAGNOSIS — R630 Anorexia: Secondary | ICD-10-CM | POA: Diagnosis not present

## 2019-05-02 DIAGNOSIS — Z9181 History of falling: Secondary | ICD-10-CM | POA: Diagnosis not present

## 2019-05-02 DIAGNOSIS — Z20822 Contact with and (suspected) exposure to covid-19: Secondary | ICD-10-CM | POA: Diagnosis not present

## 2019-05-02 DIAGNOSIS — D509 Iron deficiency anemia, unspecified: Secondary | ICD-10-CM | POA: Diagnosis not present

## 2019-05-02 DIAGNOSIS — I959 Hypotension, unspecified: Secondary | ICD-10-CM | POA: Diagnosis not present

## 2019-05-02 DIAGNOSIS — G9340 Encephalopathy, unspecified: Secondary | ICD-10-CM | POA: Diagnosis not present

## 2019-05-02 DIAGNOSIS — R600 Localized edema: Secondary | ICD-10-CM | POA: Diagnosis not present

## 2019-05-02 DIAGNOSIS — J189 Pneumonia, unspecified organism: Secondary | ICD-10-CM | POA: Diagnosis not present

## 2019-05-02 DIAGNOSIS — R0902 Hypoxemia: Secondary | ICD-10-CM | POA: Diagnosis not present

## 2019-05-02 DIAGNOSIS — E440 Moderate protein-calorie malnutrition: Secondary | ICD-10-CM

## 2019-05-02 DIAGNOSIS — Z9981 Dependence on supplemental oxygen: Secondary | ICD-10-CM | POA: Diagnosis not present

## 2019-05-02 DIAGNOSIS — B373 Candidiasis of vulva and vagina: Secondary | ICD-10-CM | POA: Diagnosis not present

## 2019-05-02 DIAGNOSIS — F1129 Opioid dependence with unspecified opioid-induced disorder: Secondary | ICD-10-CM | POA: Diagnosis not present

## 2019-05-02 DIAGNOSIS — R05 Cough: Secondary | ICD-10-CM | POA: Diagnosis not present

## 2019-05-02 DIAGNOSIS — D72829 Elevated white blood cell count, unspecified: Secondary | ICD-10-CM | POA: Diagnosis not present

## 2019-05-02 DIAGNOSIS — M6281 Muscle weakness (generalized): Secondary | ICD-10-CM | POA: Diagnosis not present

## 2019-05-02 DIAGNOSIS — Z79899 Other long term (current) drug therapy: Secondary | ICD-10-CM | POA: Diagnosis not present

## 2019-05-02 DIAGNOSIS — F329 Major depressive disorder, single episode, unspecified: Secondary | ICD-10-CM

## 2019-05-02 DIAGNOSIS — E876 Hypokalemia: Secondary | ICD-10-CM | POA: Diagnosis not present

## 2019-05-02 MED ORDER — HYDROCODONE-ACETAMINOPHEN 7.5-325 MG PO TABS: 1.0000 | ORAL_TABLET | Freq: Four times a day (QID) | ORAL | 0 refills | Status: DC | PRN

## 2019-05-02 MED ORDER — MORPHINE SULFATE ER 30 MG PO TBCR: 30.0000 mg | EXTENDED_RELEASE_TABLET | Freq: Two times a day (BID) | ORAL | 0 refills | Status: DC

## 2019-05-02 NOTE — ED Notes (Signed)
O2 infusing via n/c @ 3L/min - Pure Wick intact. Pt aware will be transported to [place] around 1300 today. Pt voices agreement w/tx plan.

## 2019-05-02 NOTE — ED Notes (Signed)
Breakfast Ordered 

## 2019-05-02 NOTE — Telephone Encounter (Signed)
Agree with VO for HHN including 1x week for 5 weeks w/ 2 PRNs and wound care orders

## 2019-05-02 NOTE — Telephone Encounter (Signed)
HHN  Requests VO 1x week for 5 weeks with 2 prn visits Wound on left lower leg, inside, cannot give measurements at this time, she is drivingwill clean area w/ NS, apply ALLEVYN, wrap in kerlex 2x weekly, will teach caregiver to do care 1x weekly Also both lower legs are weepy appt 4/19 at 1445 Do you agree?

## 2019-05-02 NOTE — ED Notes (Addendum)
[facility] has arrived to transport pt to [place] of pt's belongings - purse, bag, red/white colostomy bag box, cane, and all other pt's belongings - [facility] - Pt aware. PureWick removed. Pt emptied air from colostomy bag. D/C instructions discussed w/pt - questions answered to satisfaction. Paperwork given to [employer]. Pt unable to sign - blind.

## 2019-05-02 NOTE — Progress Notes (Addendum)
9:20am: CSW spoke with Lavella Lemons at [place] to determine room assignment information. The patient will go to room 318. The patient will be transported via [place] arranged for [facility] to pick up patient at 12:45pm. The patient can arrive to [facility] at 1pm. The number to call for report is [phone].   8am: CSW met with patient at bedside to update her of the discharge plan. Patient is agreeable and ready for discharge to [facility].  CSW reached out to [place] in admissions to initiate the transfer, a voicemail was left requesting a return call.  Madilyn Fireman, MSW, LCSW-A Transitions of Care  Clinical Social Worker  [facility] Emergency Departments  Medical ICU [phone]
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
## 2019-05-02 NOTE — ED Provider Notes (Signed)
Patient has been accepted to [facility] and will be discharged this afternoon. Stable vitals.   Sherwood Gambler, MD 05/02/19 [phone]

## 2019-05-02 NOTE — Telephone Encounter (Signed)
Donita with [facility] requesting VO for nursing, please call pt back.

## 2019-05-03 ENCOUNTER — Ambulatory Visit: Payer: Medicare Other | Admitting: Podiatry

## 2019-05-04 ENCOUNTER — Non-Acute Institutional Stay (SKILLED_NURSING_FACILITY): Payer: Medicare Other | Admitting: Internal Medicine

## 2019-05-04 ENCOUNTER — Encounter: Payer: Self-pay | Admitting: Internal Medicine

## 2019-05-04 DIAGNOSIS — J418 Mixed simple and mucopurulent chronic bronchitis: Secondary | ICD-10-CM | POA: Diagnosis not present

## 2019-05-04 DIAGNOSIS — R627 Adult failure to thrive: Secondary | ICD-10-CM | POA: Diagnosis not present

## 2019-05-04 DIAGNOSIS — D649 Anemia, unspecified: Secondary | ICD-10-CM | POA: Diagnosis not present

## 2019-05-04 DIAGNOSIS — Z9189 Other specified personal risk factors, not elsewhere classified: Secondary | ICD-10-CM

## 2019-05-04 NOTE — Assessment & Plan Note (Addendum)
She denies resp compromise.O2 sats excellent on supplemental oxygen and present pulmonary toilet.

## 2019-05-04 NOTE — Patient Instructions (Signed)
See assessment and plan under each diagnosis in the problem list and acutely for this visit 

## 2019-05-04 NOTE — Assessment & Plan Note (Signed)
She is opioid dependent and aware of its associated risk.

## 2019-05-04 NOTE — Progress Notes (Signed)
NURSING HOME LOCATION:  [name] ROOM NUMBER:  318-A  CODE STATUS:  Full Code  PCP:  Mosetta Anis, MD  [address] [place] [address]  This is a comprehensive admission note to [facility], [facility], [facility] performed on this date less than 30 days from date of admission. Included are preadmission medical/surgical history; reconciled medication list; family history; social history and comprehensive review of systems.  Corrections and additions to the records were documented. Comprehensive physical exam was also performed. Additionally a clinical summary was entered for each active diagnosis pertinent to this admission in the Problem List to enhance continuity of care.  HPI: The patient was hospitalized 3/23-05/02/2019 with acute upper GI bleeding due to gastritis, admitted with severe symptomatic anemia.  Hemoglobin was 3.8 at admission.  She received a total of 3 units of PRBC.  EGD revealed benign-appearing esophageal stenosis, acute gastritis and nonbleeding gastric ulcer.  Hemoglobin stabilized and was 11.2 at discharge 04/25/2019 to home.  She has longstanding COPD and was determined to be oxygen dependent.  She was discharged on 3 L supplemental oxygen with continuation of aggressive pulmonary toilet.  Moderate size bilateral pleural effusions were documented, attributed to severe protein/caloric malnutrition as albumin was 2.2.  Thoracentesis was not performed because of the multiple comorbidities. She returned to the ED 4/3 as she had run out of the nasal oxygen and was having difficulty arranging delivery of refills.  She was fearful being at home without oxygen but had no other acute complaints.  Past medical and surgical history: Includes history of ischemic colitis, duodenal ulcer, chronic anxiety/depression, history of perforated diverticulum/post colostomy, history of basal cell carcinoma, bilateral blindness, DDD, history of osteomyelitis, dyslipidemia, hepatic steatosis, and  chronic pain syndrome.  Allergy list includes duloxetine yet this is a maintenance medication.No adverse effects with Duloxetine reported.  Social history: Nondrinker.  She has a phenomenal history of smoking of 88.5 pack years.  Family history: Reviewed   Review of systems: She initially gave the date as March 7 or 8, 2021 but corrected this.  She states that she is having some confusion related to the isolation/quarantine here at the SNF.  Although she lives at home she states that she has contact with friends on a daily basis and they "help each other".  Apparently she also has an aide up to 3 hours a day for 7 days a week up to 80 hours total per month. Her major problem is the chronic, constant back pain which radiates anteriorly to the abdomen.  She states that she only gets her narcotic medications from her PCP Dr. Truman Hayward. She asked "is there any hope for me".  I stressed that the pain medication should not be increased because of the risk of respiratory suppression.  Also I emphasized the need for her to continue her oxygen and guarantee its continuity.  Finally focus on nutrition to address the hypoalbuminemia was discussed.  Constitutional: No fever  Eyes: No redness, discharge, pain, vision change ENT/mouth: No nasal congestion, purulent discharge, earache, change in hearing, sore throat  Cardiovascular: No chest pain, palpitations, paroxysmal nocturnal dyspnea, claudication, edema  Respiratory: No hemoptysis, significant snoring, apnea Gastrointestinal: No heartburn, dysphagia, nausea /vomiting, rectal bleeding, melena, change in bowels Genitourinary: No dysuria, hematuria, pyuria, incontinence, nocturia Dermatologic: No rash, pruritus, change in appearance of skin Neurologic: No dizziness, headache, syncope, seizures Endocrine: No change in hair/skin/nails, excessive thirst, excessive hunger, excessive urination  Hematologic/lymphatic: No significant bruising, lymphadenopathy, abnormal  bleeding Allergy/immunology: No  itchy/watery eyes, significant sneezing, urticaria, angioedema  Physical exam:  Pertinent or positive findings: She appears cachectic and chronically malnourished.  She is lethargic and initially had difficulty staying awake to eat.  She stares blankly ahead in the context of her blindness.  She is wearing nasal oxygen.  She has an upper plate only.  She speaks in short sentences but denies any respiratory compromise.  Breath sounds were clear superiorly.  Also heart sounds are easily auscultated ; there is a slight increase in S1.  She did not exhibit the classic silent lungs and auscultation of heart sounds only in the epigastric area as would have been expected.  Bowel sounds are active.  Pedal pulses are palpable.  Generally there is a pallor to her skin.  She has sclerotic and scattered hyperpigmentation changes over the shins, greater on the right than the left.  A dressing is present on the left shin.  General appearance: no acute distress, increased work of breathing is present.   Lymphatic: No lymphadenopathy about the head, neck, axilla. Eyes: No conjunctival inflammation or lid edema is present. There is no scleral icterus. Ears:  External ear exam shows no significant lesions or deformities.   Nose:  External nasal examination shows no deformity or inflammation. Nasal mucosa are pink and moist without lesions, exudates Oral exam: Lips and gums are healthy appearing.There is no oropharyngeal erythema or exudate. Neck:  No thyromegaly, masses, tenderness noted.    Heart:  No gallop, murmur, click, rub.  Lungs: without wheezes, rhonchi, rales, rubs. Abdomen:  Abdomen is soft and nontender with no organomegaly, hernias, masses. GU: Deferred  Extremities:  No cyanosis, edema. Neurologic exam: Balance, Rhomberg, finger to nose testing could not be completed due to clinical state Skin: Warm & dry w/o tenting. No significant  rash.  See clinical summary under  each active problem in the Problem List with associated updated therapeutic plan

## 2019-05-06 ENCOUNTER — Other Ambulatory Visit: Payer: Self-pay | Admitting: Internal Medicine

## 2019-05-06 DIAGNOSIS — R000 Tachycardia, unspecified: Secondary | ICD-10-CM

## 2019-05-06 DIAGNOSIS — R627 Adult failure to thrive: Secondary | ICD-10-CM | POA: Insufficient documentation

## 2019-05-06 NOTE — Assessment & Plan Note (Signed)
GI ROS negative for melena or rectal bleeding, but vision loss impacts validity of report

## 2019-05-06 NOTE — Assessment & Plan Note (Signed)
See 05/04/2019 query : "is there any hope for me?".    The designation "Full Code" subjects the patient to external cardiac compression, endotracheal intubation, and mechanical ventilator support.    In the presence of advanced and serious comorbid conditions as are present; no study has shown any benefit for these interventions. Unfortunately it simply prolongs the process of dying and and painfully so.    In fact external chest compression unfortunately will result in multiple rib and possibly sternum fractures with associated severe pain in the context of her senile osteoporosis.      "Limited Code" can be discussed with her PCP, whom she trusts.  This would entail treating reversible processesvsuch as serious heart irregularities with medications or even cardioversion; medication for pain control; and antibiotics for infections.    The emphasis should be on providing her with comfort and dignity.

## 2019-05-08 ENCOUNTER — Non-Acute Institutional Stay (SKILLED_NURSING_FACILITY): Payer: Medicare Other | Admitting: Adult Health

## 2019-05-08 ENCOUNTER — Encounter: Payer: Self-pay | Admitting: Adult Health

## 2019-05-08 DIAGNOSIS — I9589 Other hypotension: Secondary | ICD-10-CM | POA: Diagnosis not present

## 2019-05-08 DIAGNOSIS — R630 Anorexia: Secondary | ICD-10-CM

## 2019-05-08 DIAGNOSIS — E876 Hypokalemia: Secondary | ICD-10-CM | POA: Diagnosis not present

## 2019-05-08 DIAGNOSIS — E861 Hypovolemia: Secondary | ICD-10-CM

## 2019-05-08 DIAGNOSIS — G9340 Encephalopathy, unspecified: Secondary | ICD-10-CM | POA: Diagnosis not present

## 2019-05-08 DIAGNOSIS — D508 Other iron deficiency anemias: Secondary | ICD-10-CM

## 2019-05-08 DIAGNOSIS — D72829 Elevated white blood cell count, unspecified: Secondary | ICD-10-CM | POA: Diagnosis not present

## 2019-05-08 LAB — BASIC METABOLIC PANEL
BUN: 6 (ref 4–21)
CO2: 43 — AB (ref 13–22)
Chloride: 79 — AB (ref 99–108)
Creatinine: 0.5 (ref 0.5–1.1)
Glucose: 93
Potassium: 3.3 — AB (ref 3.4–5.3)
Sodium: 134 — AB (ref 137–147)

## 2019-05-08 LAB — CBC: RBC: 5.17 — AB (ref 3.87–5.11)

## 2019-05-08 LAB — CBC AND DIFFERENTIAL
HCT: 43 (ref 36–46)
Hemoglobin: 13.2 (ref 12.0–16.0)
Platelets: 312 (ref 150–399)
WBC: 14.1

## 2019-05-08 LAB — COMPREHENSIVE METABOLIC PANEL
Calcium: 8.7 (ref 8.7–10.7)
GFR calc Af Amer: 90
GFR calc non Af Amer: 90

## 2019-05-08 MED ORDER — CYCLOBENZAPRINE HCL 5 MG PO TABS: 5.0000 mg | ORAL_TABLET | Freq: Two times a day (BID) | ORAL | 0 refills | Status: DC | PRN

## 2019-05-08 MED ORDER — TRAZODONE HCL 50 MG PO TABS: ORAL_TABLET | ORAL | 0 refills | Status: DC

## 2019-05-08 MED ORDER — MORPHINE SULFATE ER 15 MG PO TBCR: 15.0000 mg | EXTENDED_RELEASE_TABLET | Freq: Two times a day (BID) | ORAL | 0 refills | Status: AC

## 2019-05-08 MED ORDER — MIRTAZAPINE 15 MG PO TABS: 15.0000 mg | ORAL_TABLET | Freq: Every day | ORAL | 0 refills | Status: DC

## 2019-05-08 MED ORDER — FUROSEMIDE 20 MG PO TABS: 20.0000 mg | ORAL_TABLET | Freq: Every day | ORAL | 0 refills | Status: DC
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
## 2019-05-09 ENCOUNTER — Non-Acute Institutional Stay (SKILLED_NURSING_FACILITY): Payer: Medicare Other | Admitting: Internal Medicine

## 2019-05-09 ENCOUNTER — Encounter: Payer: Self-pay | Admitting: Internal Medicine

## 2019-05-09 DIAGNOSIS — F1129 Opioid dependence with unspecified opioid-induced disorder: Secondary | ICD-10-CM

## 2019-05-09 DIAGNOSIS — J189 Pneumonia, unspecified organism: Secondary | ICD-10-CM | POA: Diagnosis not present

## 2019-05-09 DIAGNOSIS — R627 Adult failure to thrive: Secondary | ICD-10-CM

## 2019-05-09 LAB — BASIC METABOLIC PANEL
BUN: 5 (ref 4–21)
CO2: 39 — AB (ref 13–22)
Chloride: 81 — AB (ref 99–108)
Creatinine: 0.4 — AB (ref 0.5–1.1)
Glucose: 92
Potassium: 2.9 — AB (ref 3.4–5.3)
Sodium: 136 — AB (ref 137–147)

## 2019-05-09 LAB — COMPREHENSIVE METABOLIC PANEL
Calcium: 9.1 (ref 8.7–10.7)
GFR calc Af Amer: 90
GFR calc non Af Amer: 90

## 2019-05-09 NOTE — Progress Notes (Signed)
NURSING HOME LOCATION:  [name] ROOM NUMBER:  318/A  CODE STATUS:  Full Code  PCP:  Gilberto Better K MD.  This is a nursing facility follow up for specific acute issue of abnormal CXR in context of hypoxia.  Interim medical record and care since last [place] visit was updated with review of diagnostic studies and change in clinical status since last visit were documented.   HPI: Hypoxia was documented and portable chest x-ray ordered.  This film was personally reviewed.  The report on the portable film suggested left pleural effusion with "significantly improvement" compared to 04/08/2018 x-ray also performed by [facility]. A right lower lobe infiltrate noted on 3/13 was improved, but there was no change in the left lower lobe atelectasis/congestion.  There is also suggestion of new lobular tissue infiltrates in the left apex and left upper lobe as well as the left midlung field.  There were residual right lower lobe densities suggesting peribronchial thickening.  The left lower lobe effusion/consolidation was stable.  I would be concerned about multifocal pneumonia. She was started on Rocephin. Her chronic pain medications were decreased because of the hypoxia and hypotension. Labs were performed yesterday and revealed sodium 134, potassium 3.3, CO2 of 43, and creatinine of 0.45.  White blood count was 14,100 with left shift.  She had slight hypochromic indices but no anemia was present.   Review of systems: Her major complaint is pain around the colostomy "from hernia".  She also continues to have the chronic low back pain. She has no significant change in her respiratory symptoms and no definite symptoms to suggest Covid.  Constitutional: No fever  Eyes: No redness, discharge, pain, vision change ENT/mouth: No nasal congestion,  purulent discharge, earache, change in hearing, sore throat  Cardiovascular: No chest pain, palpitations, paroxysmal nocturnal dyspnea,  edema  Respiratory: No sputum production, hemoptysis, significant snoring, apnea   Gastrointestinal: No heartburn, dysphagia, nausea /vomiting, rectal bleeding, melena, change in bowels Genitourinary: No dysuria, hematuria, pyuria, nocturia Musculoskeletal: No joint stiffness, joint swelling, weakness, pain Neurologic: No dizziness, headache Endocrine: No change in hair/skin/nails, excessive thirst, excessive hunger, excessive urination  Hematologic/lymphatic: No significant bruising, lymphadenopathy, abnormal bleeding  Physical exam:  Pertinent or positive findings: Despite the decrease in narcotic dosage, she remains very lethargic.  She is grossly cachectic with profound limb atrophy diffusely.  She stares blankly in the context of legal blindness.  The mandible is edentulous. Slight tachycardia is present.  Heart sounds are surprisingly loud for her documented COPD.  Breath sounds are decreased.  Bowel sounds are decreased.  Pedal pulses are decreased.  As noted she is profoundly weak in all extremities.  General appearance: no acute distress, increased work of breathing is present clinically despite hypoxia.   Lymphatic: No lymphadenopathy about the head, neck, axilla. Eyes: No conjunctival inflammation or lid edema is present. There is no scleral icterus. Ears:  External ear exam shows no significant lesions or deformities.   Nose:  External nasal examination shows no deformity or inflammation. Nasal mucosa are dry Neck:  No thyromegaly, masses, tenderness noted.    Heart:  No murmur, click, rub .  Lungs: without wheezes, rhonchi, rales, rubs. Abdomen: Abdomen is soft and nontender with no organomegaly, hernias, masses. GU: Deferred  Extremities:  No cyanosis, clubbing, edema  Neurologic exam :Balance, Rhomberg, finger to nose testing could not be completed due to clinical state Skin: Warm & dry w/o tenting. No significant lesions or rash.  See summary under  each active problem in  the Problem List with associated updated therapeutic plan

## 2019-05-09 NOTE — Assessment & Plan Note (Addendum)
It is most unlikely she would survive CPR in her debilitated state.  I attempted to broach the subject of limited code but she was unreceptive.  I defer this to her PCP  with whom she has a long-term relationship.

## 2019-05-11 DIAGNOSIS — F1129 Opioid dependence with unspecified opioid-induced disorder: Secondary | ICD-10-CM | POA: Insufficient documentation

## 2019-05-11 NOTE — Patient Instructions (Signed)
See assessment and plan under each diagnosis in the problem list and acutely for this visit 

## 2019-05-11 NOTE — Assessment & Plan Note (Signed)
Narcotic dose decreased due to hypotension , hypoxia & somnulence.

## 2019-05-12 ENCOUNTER — Non-Acute Institutional Stay (SKILLED_NURSING_FACILITY): Payer: Medicare Other | Admitting: Adult Health

## 2019-05-12 ENCOUNTER — Encounter: Payer: Self-pay | Admitting: Adult Health

## 2019-05-12 DIAGNOSIS — J418 Mixed simple and mucopurulent chronic bronchitis: Secondary | ICD-10-CM | POA: Diagnosis not present

## 2019-05-12 DIAGNOSIS — E876 Hypokalemia: Secondary | ICD-10-CM

## 2019-05-12 DIAGNOSIS — J189 Pneumonia, unspecified organism: Secondary | ICD-10-CM
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
## 2019-05-15 ENCOUNTER — Encounter: Payer: Medicare Other | Admitting: Internal Medicine

## 2019-05-15 LAB — BASIC METABOLIC PANEL
BUN: 12 (ref 4–21)
CO2: 36 — AB (ref 13–22)
Chloride: 84 — AB (ref 99–108)
Creatinine: 0.4 — AB (ref 0.5–1.1)
Glucose: 96
Potassium: 3.7 (ref 3.4–5.3)
Sodium: 137 (ref 137–147)

## 2019-05-15 LAB — COMPREHENSIVE METABOLIC PANEL
Calcium: 9.5 (ref 8.7–10.7)
GFR calc Af Amer: 90
GFR calc non Af Amer: 90

## 2019-05-18 ENCOUNTER — Encounter: Payer: Self-pay | Admitting: Adult Health

## 2019-05-18 ENCOUNTER — Non-Acute Institutional Stay (SKILLED_NURSING_FACILITY): Payer: Medicare Other | Admitting: Adult Health

## 2019-05-18 DIAGNOSIS — B3731 Acute candidiasis of vulva and vagina: Secondary | ICD-10-CM

## 2019-05-18 DIAGNOSIS — B373 Candidiasis of vulva and vagina: Secondary | ICD-10-CM | POA: Diagnosis not present

## 2019-05-18 DIAGNOSIS — J189 Pneumonia, unspecified organism: Secondary | ICD-10-CM

## 2019-05-18 DIAGNOSIS — R627 Adult failure to thrive: Secondary | ICD-10-CM

## 2019-05-18 DIAGNOSIS — E876 Hypokalemia: Secondary | ICD-10-CM

## 2019-05-18 MED ORDER — FLUCONAZOLE 150 MG PO TABS: 150.0000 mg | ORAL_TABLET | Freq: Every day | ORAL | 0 refills | Status: DC
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
## 2019-05-26 DIAGNOSIS — R269 Unspecified abnormalities of gait and mobility: Secondary | ICD-10-CM | POA: Diagnosis not present

## 2019-05-26 DIAGNOSIS — J449 Chronic obstructive pulmonary disease, unspecified: Secondary | ICD-10-CM | POA: Diagnosis not present

## 2019-05-29 DIAGNOSIS — J449 Chronic obstructive pulmonary disease, unspecified: Secondary | ICD-10-CM | POA: Diagnosis not present

## 2019-05-29 DIAGNOSIS — R269 Unspecified abnormalities of gait and mobility: Secondary | ICD-10-CM | POA: Diagnosis not present

## 2019-05-30 ENCOUNTER — Encounter: Payer: Medicare Other | Admitting: Internal Medicine

## 2019-06-01 ENCOUNTER — Encounter: Payer: Self-pay | Admitting: Adult Health

## 2019-06-01 ENCOUNTER — Non-Acute Institutional Stay (SKILLED_NURSING_FACILITY): Payer: Medicare Other | Admitting: Adult Health

## 2019-06-01 DIAGNOSIS — R627 Adult failure to thrive: Secondary | ICD-10-CM | POA: Diagnosis not present

## 2019-06-01 DIAGNOSIS — G894 Chronic pain syndrome: Secondary | ICD-10-CM

## 2019-06-01 DIAGNOSIS — E785 Hyperlipidemia, unspecified: Secondary | ICD-10-CM

## 2019-06-01 DIAGNOSIS — K2900 Acute gastritis without bleeding: Secondary | ICD-10-CM

## 2019-06-01 DIAGNOSIS — E876 Hypokalemia: Secondary | ICD-10-CM
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
## 2019-06-03 ENCOUNTER — Other Ambulatory Visit: Payer: Self-pay | Admitting: Adult Health

## 2019-06-03 DIAGNOSIS — G894 Chronic pain syndrome: Secondary | ICD-10-CM

## 2019-06-03 MED ORDER — HYDROCODONE-ACETAMINOPHEN 7.5-325 MG PO TABS: 1.0000 | ORAL_TABLET | Freq: Four times a day (QID) | ORAL | 0 refills | Status: DC | PRN

## 2019-06-10 ENCOUNTER — Other Ambulatory Visit: Payer: Self-pay | Admitting: Internal Medicine

## 2019-06-10 DIAGNOSIS — F329 Major depressive disorder, single episode, unspecified: Secondary | ICD-10-CM

## 2019-06-13 ENCOUNTER — Other Ambulatory Visit: Payer: Self-pay | Admitting: Internal Medicine

## 2019-06-13 ENCOUNTER — Encounter: Payer: Medicare Other | Admitting: Internal Medicine

## 2019-06-19 ENCOUNTER — Other Ambulatory Visit: Payer: Self-pay | Admitting: Adult Health

## 2019-06-19 DIAGNOSIS — G894 Chronic pain syndrome: Secondary | ICD-10-CM

## 2019-06-19 MED ORDER — HYDROCODONE-ACETAMINOPHEN 7.5-325 MG PO TABS: 1.0000 | ORAL_TABLET | Freq: Four times a day (QID) | ORAL | 0 refills | Status: DC | PRN

## 2019-06-25 DIAGNOSIS — R269 Unspecified abnormalities of gait and mobility: Secondary | ICD-10-CM | POA: Diagnosis not present

## 2019-06-25 DIAGNOSIS — J449 Chronic obstructive pulmonary disease, unspecified: Secondary | ICD-10-CM | POA: Diagnosis not present

## 2019-06-27 ENCOUNTER — Non-Acute Institutional Stay (SKILLED_NURSING_FACILITY): Payer: Medicare Other | Admitting: Adult Health

## 2019-06-27 ENCOUNTER — Encounter: Payer: Self-pay | Admitting: Adult Health

## 2019-06-27 DIAGNOSIS — I952 Hypotension due to drugs: Secondary | ICD-10-CM | POA: Diagnosis not present

## 2019-06-27 DIAGNOSIS — Z9119 Patient's noncompliance with other medical treatment and regimen: Secondary | ICD-10-CM

## 2019-06-27 DIAGNOSIS — G894 Chronic pain syndrome: Secondary | ICD-10-CM | POA: Diagnosis not present

## 2019-06-27 DIAGNOSIS — Z91199 Patient's noncompliance with other medical treatment and regimen due to unspecified reason: Secondary | ICD-10-CM

## 2019-06-27 DIAGNOSIS — R000 Tachycardia, unspecified: Secondary | ICD-10-CM | POA: Diagnosis not present

## 2019-06-27 DIAGNOSIS — F329 Major depressive disorder, single episode, unspecified: Secondary | ICD-10-CM

## 2019-06-27 DIAGNOSIS — S81802A Unspecified open wound, left lower leg, initial encounter: Secondary | ICD-10-CM
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
## 2019-06-29 DIAGNOSIS — J449 Chronic obstructive pulmonary disease, unspecified: Secondary | ICD-10-CM | POA: Diagnosis not present

## 2019-06-29 DIAGNOSIS — R269 Unspecified abnormalities of gait and mobility: Secondary | ICD-10-CM | POA: Diagnosis not present

## 2019-06-30 ENCOUNTER — Encounter: Payer: Self-pay | Admitting: Adult Health

## 2019-06-30 ENCOUNTER — Non-Acute Institutional Stay (SKILLED_NURSING_FACILITY): Payer: Medicare Other | Admitting: Adult Health

## 2019-06-30 DIAGNOSIS — R000 Tachycardia, unspecified: Secondary | ICD-10-CM | POA: Diagnosis not present

## 2019-06-30 DIAGNOSIS — R1312 Dysphagia, oropharyngeal phase: Secondary | ICD-10-CM | POA: Diagnosis not present

## 2019-06-30 DIAGNOSIS — R627 Adult failure to thrive: Secondary | ICD-10-CM | POA: Diagnosis not present
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
## 2019-07-03 ENCOUNTER — Other Ambulatory Visit: Payer: Self-pay | Admitting: Adult Health

## 2019-07-03 ENCOUNTER — Emergency Department (HOSPITAL_COMMUNITY): Payer: Medicare Other

## 2019-07-03 ENCOUNTER — Inpatient Hospital Stay (HOSPITAL_COMMUNITY)
Admission: EM | Admit: 2019-07-03 | Discharge: 2019-07-07 | DRG: 291 | Disposition: A | Discharge status: Skilled Nursing Facility | Payer: Medicare Other | Attending: Internal Medicine | Admitting: Internal Medicine

## 2019-07-03 ENCOUNTER — Encounter (HOSPITAL_COMMUNITY): Payer: Self-pay | Admitting: *Deleted

## 2019-07-03 ENCOUNTER — Observation Stay (HOSPITAL_COMMUNITY): Payer: Medicare Other

## 2019-07-03 ENCOUNTER — Other Ambulatory Visit: Payer: Self-pay

## 2019-07-03 DIAGNOSIS — Z85828 Personal history of other malignant neoplasm of skin: Secondary | ICD-10-CM | POA: Diagnosis not present

## 2019-07-03 DIAGNOSIS — I110 Hypertensive heart disease with heart failure: Principal | ICD-10-CM | POA: Diagnosis present

## 2019-07-03 DIAGNOSIS — R0603 Acute respiratory distress: Secondary | ICD-10-CM | POA: Diagnosis not present

## 2019-07-03 DIAGNOSIS — R000 Tachycardia, unspecified: Secondary | ICD-10-CM

## 2019-07-03 DIAGNOSIS — Z9049 Acquired absence of other specified parts of digestive tract: Secondary | ICD-10-CM | POA: Diagnosis not present

## 2019-07-03 DIAGNOSIS — J9811 Atelectasis: Secondary | ICD-10-CM | POA: Diagnosis not present

## 2019-07-03 DIAGNOSIS — G894 Chronic pain syndrome: Secondary | ICD-10-CM

## 2019-07-03 DIAGNOSIS — M546 Pain in thoracic spine: Secondary | ICD-10-CM | POA: Diagnosis not present

## 2019-07-03 DIAGNOSIS — H353290 Exudative age-related macular degeneration, unspecified eye, stage unspecified: Secondary | ICD-10-CM | POA: Diagnosis not present

## 2019-07-03 DIAGNOSIS — R7989 Other specified abnormal findings of blood chemistry: Secondary | ICD-10-CM | POA: Diagnosis not present

## 2019-07-03 DIAGNOSIS — Z79891 Long term (current) use of opiate analgesic: Secondary | ICD-10-CM

## 2019-07-03 DIAGNOSIS — M4854XA Collapsed vertebra, not elsewhere classified, thoracic region, initial encounter for fracture: Secondary | ICD-10-CM | POA: Diagnosis present

## 2019-07-03 DIAGNOSIS — Z886 Allergy status to analgesic agent status: Secondary | ICD-10-CM | POA: Diagnosis not present

## 2019-07-03 DIAGNOSIS — Z87891 Personal history of nicotine dependence: Secondary | ICD-10-CM | POA: Diagnosis not present

## 2019-07-03 DIAGNOSIS — I700 Atherosclerosis of aorta: Secondary | ICD-10-CM | POA: Diagnosis present

## 2019-07-03 DIAGNOSIS — J9600 Acute respiratory failure, unspecified whether with hypoxia or hypercapnia: Secondary | ICD-10-CM | POA: Diagnosis not present

## 2019-07-03 DIAGNOSIS — I5033 Acute on chronic diastolic (congestive) heart failure: Secondary | ICD-10-CM | POA: Diagnosis not present

## 2019-07-03 DIAGNOSIS — R1312 Dysphagia, oropharyngeal phase: Secondary | ICD-10-CM | POA: Diagnosis not present

## 2019-07-03 DIAGNOSIS — Z8249 Family history of ischemic heart disease and other diseases of the circulatory system: Secondary | ICD-10-CM | POA: Diagnosis not present

## 2019-07-03 DIAGNOSIS — J9611 Chronic respiratory failure with hypoxia: Secondary | ICD-10-CM | POA: Diagnosis present

## 2019-07-03 DIAGNOSIS — R109 Unspecified abdominal pain: Secondary | ICD-10-CM | POA: Diagnosis not present

## 2019-07-03 DIAGNOSIS — R64 Cachexia: Secondary | ICD-10-CM | POA: Diagnosis present

## 2019-07-03 DIAGNOSIS — E785 Hyperlipidemia, unspecified: Secondary | ICD-10-CM | POA: Diagnosis not present

## 2019-07-03 DIAGNOSIS — J90 Pleural effusion, not elsewhere classified: Secondary | ICD-10-CM | POA: Diagnosis not present

## 2019-07-03 DIAGNOSIS — Z8719 Personal history of other diseases of the digestive system: Secondary | ICD-10-CM

## 2019-07-03 DIAGNOSIS — Z91040 Latex allergy status: Secondary | ICD-10-CM

## 2019-07-03 DIAGNOSIS — J181 Lobar pneumonia, unspecified organism: Secondary | ICD-10-CM | POA: Diagnosis present

## 2019-07-03 DIAGNOSIS — J418 Mixed simple and mucopurulent chronic bronchitis: Secondary | ICD-10-CM | POA: Diagnosis not present

## 2019-07-03 DIAGNOSIS — J9621 Acute and chronic respiratory failure with hypoxia: Secondary | ICD-10-CM | POA: Diagnosis not present

## 2019-07-03 DIAGNOSIS — Z743 Need for continuous supervision: Secondary | ICD-10-CM | POA: Diagnosis not present

## 2019-07-03 DIAGNOSIS — Z9981 Dependence on supplemental oxygen: Secondary | ICD-10-CM | POA: Diagnosis not present

## 2019-07-03 DIAGNOSIS — R0602 Shortness of breath: Secondary | ICD-10-CM | POA: Diagnosis not present

## 2019-07-03 DIAGNOSIS — Z681 Body mass index (BMI) 19 or less, adult: Secondary | ICD-10-CM

## 2019-07-03 DIAGNOSIS — T68XXXA Hypothermia, initial encounter: Secondary | ICD-10-CM | POA: Diagnosis not present

## 2019-07-03 DIAGNOSIS — Z20822 Contact with and (suspected) exposure to covid-19: Secondary | ICD-10-CM | POA: Diagnosis present

## 2019-07-03 DIAGNOSIS — Z833 Family history of diabetes mellitus: Secondary | ICD-10-CM | POA: Diagnosis not present

## 2019-07-03 DIAGNOSIS — D649 Anemia, unspecified: Secondary | ICD-10-CM | POA: Diagnosis not present

## 2019-07-03 DIAGNOSIS — J918 Pleural effusion in other conditions classified elsewhere: Secondary | ICD-10-CM | POA: Diagnosis present

## 2019-07-03 DIAGNOSIS — J449 Chronic obstructive pulmonary disease, unspecified: Secondary | ICD-10-CM | POA: Diagnosis not present

## 2019-07-03 DIAGNOSIS — Z79899 Other long term (current) drug therapy: Secondary | ICD-10-CM

## 2019-07-03 DIAGNOSIS — Z801 Family history of malignant neoplasm of trachea, bronchus and lung: Secondary | ICD-10-CM

## 2019-07-03 DIAGNOSIS — M549 Dorsalgia, unspecified: Secondary | ICD-10-CM | POA: Diagnosis present

## 2019-07-03 DIAGNOSIS — R0902 Hypoxemia: Secondary | ICD-10-CM | POA: Diagnosis not present

## 2019-07-03 DIAGNOSIS — G8929 Other chronic pain: Secondary | ICD-10-CM

## 2019-07-03 DIAGNOSIS — R0600 Dyspnea, unspecified: Secondary | ICD-10-CM | POA: Diagnosis present

## 2019-07-03 DIAGNOSIS — D509 Iron deficiency anemia, unspecified: Secondary | ICD-10-CM | POA: Diagnosis present

## 2019-07-03 DIAGNOSIS — H548 Legal blindness, as defined in USA: Secondary | ICD-10-CM | POA: Diagnosis present

## 2019-07-03 DIAGNOSIS — Z91018 Allergy to other foods: Secondary | ICD-10-CM

## 2019-07-03 DIAGNOSIS — Z933 Colostomy status: Secondary | ICD-10-CM

## 2019-07-03 DIAGNOSIS — J189 Pneumonia, unspecified organism: Secondary | ICD-10-CM | POA: Diagnosis not present

## 2019-07-03 LAB — BASIC METABOLIC PANEL
Anion gap: 13 (ref 5–15)
BUN: 7 mg/dL — ABNORMAL LOW (ref 8–23)
BUN: 8 (ref 4–21)
BUN: 8 (ref 4–21)
CO2: 28 — AB (ref 13–22)
CO2: 28 — AB (ref 13–22)
CO2: 29 mmol/L (ref 22–32)
Calcium: 9.3 mg/dL (ref 8.9–10.3)
Chloride: 93 mmol/L — ABNORMAL LOW (ref 98–111)
Chloride: 95 — AB (ref 99–108)
Chloride: 95 — AB (ref 99–108)
Creatinine, Ser: 0.37 mg/dL — ABNORMAL LOW (ref 0.44–1.00)
Creatinine: 0.3 — AB (ref 0.5–1.1)
Creatinine: 0.3 — AB (ref 0.5–1.1)
GFR calc Af Amer: >60 mL/min (ref >60)
GFR calc non Af Amer: >60 mL/min (ref >60)
Glucose, Bld: 109 mg/dL — ABNORMAL HIGH (ref 70–99)
Glucose: 108
Glucose: 108
Potassium: 3.6 mmol/L (ref 3.5–5.1)
Potassium: 4.1 (ref 3.4–5.3)
Potassium: 4.1 (ref 3.4–5.3)
Sodium: 135 mmol/L (ref 135–145)
Sodium: 138 (ref 137–147)
Sodium: 138 (ref 137–147)

## 2019-07-03 LAB — CBC WITH DIFFERENTIAL/PLATELET
Abs Immature Granulocytes: 0.03 10*3/uL (ref 0.00–0.07)
Basophils Absolute: 0 10*3/uL (ref 0.0–0.1)
Basophils Relative: 1 %
Eosinophils Absolute: 0 10*3/uL (ref 0.0–0.5)
Eosinophils Relative: 0 %
HCT: 40 % (ref 36.0–46.0)
Hemoglobin: 11.8 g/dL — ABNORMAL LOW (ref 12.0–15.0)
Immature Granulocytes: 0 %
Lymphocytes Relative: 15 %
Lymphs Abs: 1.2 10*3/uL (ref 0.7–4.0)
MCH: 27.5 pg (ref 26.0–34.0)
MCHC: 29.5 g/dL — ABNORMAL LOW (ref 30.0–36.0)
MCV: 93.2 fL (ref 80.0–100.0)
Monocytes Absolute: 0.6 10*3/uL (ref 0.1–1.0)
Monocytes Relative: 7 %
Neutro Abs: 6.4 10*3/uL (ref 1.7–7.7)
Neutrophils Relative %: 77 %
Platelets: 387 10*3/uL (ref 150–400)
RBC: 4.29 MIL/uL (ref 3.87–5.11)
RDW: 16.3 % — ABNORMAL HIGH (ref 11.5–15.5)
WBC: 8.3 10*3/uL (ref 4.0–10.5)
nRBC: 0 % (ref 0.0–0.2)

## 2019-07-03 LAB — I-STAT VENOUS BLOOD GAS, ED
Acid-Base Excess: 12 mmol/L — ABNORMAL HIGH (ref 0.0–2.0)
Bicarbonate: 39.5 mmol/L — ABNORMAL HIGH (ref 20.0–28.0)
Calcium, Ion: 1.22 mmol/L (ref 1.15–1.40)
HCT: 35 % — ABNORMAL LOW (ref 36.0–46.0)
Hemoglobin: 11.9 g/dL — ABNORMAL LOW (ref 12.0–15.0)
O2 Saturation: 96 %
Potassium: 3.9 mmol/L (ref 3.5–5.1)
Sodium: 136 mmol/L (ref 135–145)
TCO2: 41 mmol/L — ABNORMAL HIGH (ref 22–32)
pCO2, Ven: 63.2 mmHg — ABNORMAL HIGH (ref 44.0–60.0)
pH, Ven: 7.404 (ref 7.250–7.430)
pO2, Ven: 84 mmHg — ABNORMAL HIGH (ref 32.0–45.0)

## 2019-07-03 LAB — PROTIME-INR
INR: 1 (ref 0.8–1.2)
Prothrombin Time: 12.5 seconds (ref 11.4–15.2)

## 2019-07-03 LAB — URINALYSIS, ROUTINE W REFLEX MICROSCOPIC
Bilirubin Urine: NEGATIVE
Glucose, UA: NEGATIVE mg/dL
Hgb urine dipstick: NEGATIVE
Ketones, ur: NEGATIVE mg/dL
Leukocytes,Ua: NEGATIVE
Nitrite: NEGATIVE
Protein, ur: NEGATIVE mg/dL
Specific Gravity, Urine: 1.01 (ref 1.005–1.030)
pH: 6 (ref 5.0–8.0)

## 2019-07-03 LAB — COMPREHENSIVE METABOLIC PANEL
Calcium: 9.7 (ref 8.7–10.7)
Calcium: 9.7 (ref 8.7–10.7)
GFR calc Af Amer: 90
GFR calc Af Amer: 90
GFR calc non Af Amer: 90
GFR calc non Af Amer: 90

## 2019-07-03 LAB — BRAIN NATRIURETIC PEPTIDE: B Natriuretic Peptide: 168.6 pg/mL — ABNORMAL HIGH (ref 0.0–100.0)

## 2019-07-03 LAB — LACTIC ACID, PLASMA: Lactic Acid, Venous: 1.5 mmol/L (ref 0.5–1.9)

## 2019-07-03 LAB — SARS CORONAVIRUS 2 BY RT PCR (HOSPITAL ORDER, PERFORMED IN ~~LOC~~ HOSPITAL LAB): SARS Coronavirus 2: NEGATIVE

## 2019-07-03 LAB — APTT: aPTT: 34 seconds (ref 24–36)

## 2019-07-03 LAB — TROPONIN I (HIGH SENSITIVITY): Troponin I (High Sensitivity): 18 ng/L — ABNORMAL HIGH (ref <18)

## 2019-07-03 LAB — PROCALCITONIN: Procalcitonin: 0.18 ng/mL

## 2019-07-03 LAB — D-DIMER, QUANTITATIVE: D-Dimer, Quant: 11.62 ug/mL-FEU — ABNORMAL HIGH (ref 0.00–0.50)

## 2019-07-03 MED ORDER — SODIUM CHLORIDE 0.9% FLUSH
3.0000 mL | Freq: Two times a day (BID) | INTRAVENOUS | Status: DC
  Administered 2019-07-03 – 2019-07-07 (×7): 3 mL via INTRAVENOUS

## 2019-07-03 MED ORDER — CYCLOBENZAPRINE HCL 5 MG PO TABS
5.0000 mg | ORAL_TABLET | Freq: Two times a day (BID) | ORAL | Status: DC | PRN
  Administered 2019-07-05 – 2019-07-06 (×2): 5 mg via ORAL
  Filled 2019-07-03 (×4): qty 1

## 2019-07-03 MED ORDER — METOPROLOL TARTRATE 25 MG PO TABS: 25.0000 mg | ORAL_TABLET | Freq: Two times a day (BID) | ORAL | Status: DC

## 2019-07-03 MED ORDER — DOCUSATE SODIUM 100 MG PO CAPS
100.0000 mg | ORAL_CAPSULE | Freq: Two times a day (BID) | ORAL | Status: DC
  Administered 2019-07-04 – 2019-07-07 (×6): 100 mg via ORAL
  Filled 2019-07-03 (×7): qty 1

## 2019-07-03 MED ORDER — ALBUTEROL SULFATE HFA 108 (90 BASE) MCG/ACT IN AERS
6.0000 | INHALATION_SPRAY | RESPIRATORY_TRACT | Status: AC
  Administered 2019-07-03: 6 via RESPIRATORY_TRACT
  Filled 2019-07-03: qty 6.7

## 2019-07-03 MED ORDER — SODIUM CHLORIDE 0.9 % IV SOLN
2.0000 g | Freq: Once | INTRAVENOUS | Status: AC
  Administered 2019-07-03: 2 g via INTRAVENOUS
  Filled 2019-07-03: qty 2

## 2019-07-03 MED ORDER — LACTATED RINGERS IV BOLUS (SEPSIS): 1000.0000 mL | Freq: Once | INTRAVENOUS | Status: DC

## 2019-07-03 MED ORDER — ALBUTEROL SULFATE (2.5 MG/3ML) 0.083% IN NEBU: 2.5000 mg | INHALATION_SOLUTION | Freq: Four times a day (QID) | RESPIRATORY_TRACT | Status: DC | PRN

## 2019-07-03 MED ORDER — ACETAMINOPHEN 650 MG RE SUPP: 650.0000 mg | Freq: Four times a day (QID) | RECTAL | Status: DC | PRN

## 2019-07-03 MED ORDER — SODIUM CHLORIDE 0.9 % IV SOLN
2.0000 g | Freq: Two times a day (BID) | INTRAVENOUS | Status: DC
  Administered 2019-07-04 – 2019-07-07 (×7): 2 g via INTRAVENOUS
  Filled 2019-07-03 (×8): qty 2

## 2019-07-03 MED ORDER — FUROSEMIDE 10 MG/ML IJ SOLN
40.0000 mg | Freq: Once | INTRAMUSCULAR | Status: AC
  Administered 2019-07-03: 40 mg via INTRAVENOUS
  Filled 2019-07-03: qty 4

## 2019-07-03 MED ORDER — METOPROLOL TARTRATE 25 MG PO TABS
25.0000 mg | ORAL_TABLET | Freq: Two times a day (BID) | ORAL | Status: DC
  Administered 2019-07-03 – 2019-07-07 (×7): 25 mg via ORAL
  Filled 2019-07-03 (×9): qty 1

## 2019-07-03 MED ORDER — ENOXAPARIN SODIUM 30 MG/0.3ML ~~LOC~~ SOLN
30.0000 mg | SUBCUTANEOUS | Status: DC
  Administered 2019-07-03 – 2019-07-06 (×4): 30 mg via SUBCUTANEOUS
  Filled 2019-07-03 (×4): qty 0.3

## 2019-07-03 MED ORDER — LACTATED RINGERS IV BOLUS (SEPSIS)
500.0000 mL | Freq: Once | INTRAVENOUS | Status: AC
  Administered 2019-07-03: 500 mL via INTRAVENOUS

## 2019-07-03 MED ORDER — ONDANSETRON HCL 4 MG PO TABS: 4.0000 mg | ORAL_TABLET | Freq: Four times a day (QID) | ORAL | Status: DC | PRN

## 2019-07-03 MED ORDER — HYDROCODONE-ACETAMINOPHEN 7.5-325 MG PO TABS: 1.0000 | ORAL_TABLET | Freq: Four times a day (QID) | ORAL | 0 refills | Status: DC | PRN

## 2019-07-03 MED ORDER — PRAVASTATIN SODIUM 40 MG PO TABS
40.0000 mg | ORAL_TABLET | Freq: Every day | ORAL | Status: DC
  Administered 2019-07-04 – 2019-07-07 (×4): 40 mg via ORAL
  Filled 2019-07-03 (×5): qty 1

## 2019-07-03 MED ORDER — ACETAMINOPHEN 325 MG PO TABS: 650.0000 mg | ORAL_TABLET | Freq: Four times a day (QID) | ORAL | Status: DC | PRN

## 2019-07-03 MED ORDER — SODIUM CHLORIDE 0.9 % IV SOLN
500.0000 mg | Freq: Once | INTRAVENOUS | Status: AC
  Administered 2019-07-03: 500 mg via INTRAVENOUS
  Filled 2019-07-03: qty 500

## 2019-07-03 MED ORDER — MORPHINE SULFATE ER 15 MG PO TBCR
15.0000 mg | EXTENDED_RELEASE_TABLET | Freq: Two times a day (BID) | ORAL | Status: DC | PRN
  Administered 2019-07-03 – 2019-07-07 (×6): 15 mg via ORAL
  Filled 2019-07-03 (×9): qty 1

## 2019-07-03 MED ORDER — DULOXETINE HCL 60 MG PO CPEP
60.0000 mg | ORAL_CAPSULE | Freq: Every day | ORAL | Status: DC
  Administered 2019-07-04 – 2019-07-07 (×4): 60 mg via ORAL
  Filled 2019-07-03 (×6): qty 1

## 2019-07-03 MED ORDER — HYDROCODONE-ACETAMINOPHEN 7.5-325 MG PO TABS
1.0000 | ORAL_TABLET | Freq: Four times a day (QID) | ORAL | Status: DC | PRN
  Administered 2019-07-03 – 2019-07-07 (×8): 1 via ORAL
  Filled 2019-07-03 (×9): qty 1

## 2019-07-03 MED ORDER — SUCRALFATE 1 G PO TABS: 1.0000 g | ORAL_TABLET | Freq: Three times a day (TID) | ORAL | 0 refills | Status: DC

## 2019-07-03 MED ORDER — ONDANSETRON HCL 4 MG/2ML IJ SOLN: 4.0000 mg | Freq: Four times a day (QID) | INTRAMUSCULAR | Status: DC | PRN

## 2019-07-03 MED ORDER — IOHEXOL 350 MG/ML SOLN
80.0000 mL | Freq: Once | INTRAVENOUS | Status: AC | PRN
  Administered 2019-07-03: 80 mL via INTRAVENOUS

## 2019-07-03 MED ORDER — MIRTAZAPINE 15 MG PO TABS
15.0000 mg | ORAL_TABLET | Freq: Every day | ORAL | Status: DC
  Administered 2019-07-04 – 2019-07-06 (×3): 15 mg via ORAL
  Filled 2019-07-03 (×3): qty 1
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
## 2019-07-03 NOTE — Progress Notes (Signed)
Pharmacy Antibiotic Note  [name] Bell is a 74 y.o. female admitted on 07/03/2019 with pneumonia and sepsis. Pharmacy has been consulted for cefepime dosing. Temperature not yet documented. WBC and SCr are WNL.   Plan: Cefepime 2g IV Q12H F/u renal fxn, C&S, clinical status  Height: 5\' 4"  (162.6 cm) Weight: 41.3 kg (90 lb 15.7 oz) IBW/kg (Calculated) : 54.7  No data recorded.  Recent Labs  Lab 07/03/19 1325  WBC 8.3  CREATININE 0.37*    Estimated Creatinine Clearance: 39.6 mL/min (A) (by C-G formula based on SCr of 0.37 mg/dL (L)).    Allergies  Allergen Reactions  . Strawberry (Diagnostic) Rash  . Duloxetine Nausea And Vomiting  . Ibuprofen Other (See Comments)    REACTION: Bleeding ulcers  . Nsaids Other (See Comments)    REACTION: Bleeding Ulcer  . Latex Itching    Antimicrobials this admission: Cefepime 6/7>> Azithro x 1 6/7  Dose adjustments this admission: N/A  Microbiology results: Pending  Thank you for allowing pharmacy to be a part of this patient's care.  Zaden Sako, Rande Lawman 07/03/2019 2:46 PM

## 2019-07-03 NOTE — ED Notes (Signed)
MD residents got pt blood culture and IV

## 2019-07-03 NOTE — Progress Notes (Signed)
Patient performed 150 ml's on peak flow meter. Best of 3 attempts taking.

## 2019-07-03 NOTE — ED Notes (Signed)
Requested assistance from phlebotomy  

## 2019-07-03 NOTE — ED Notes (Signed)
Pt transported to CT ?

## 2019-07-03 NOTE — ED Provider Notes (Signed)
      ED Course/Procedures     Ultrasound ED Peripheral IV (Provider)  Date/Time: 07/03/2019 4:28 PM Performed by: Delma Post, MD Authorized by: Varney Biles, MD   Procedure details:    Indications: hydration, multiple failed IV attempts and poor IV access     Skin Prep: chlorhexidine gluconate     Location:  Left anterior forearm   Angiocath:  20 G   Bedside Ultrasound Guided: Yes     Images: not archived     Patient tolerated procedure without complications: Yes     Dressing applied: Yes            Delma Post, MD 07/03/19 [place], [place], MD 07/03/19 [id number]

## 2019-07-03 NOTE — Progress Notes (Signed)
Secure chat with bedside nurse regarding lactic acid, blood cultures and need for fluids per protocol for MAP <65 x 2. She responded with access issues and added the provider to our conversation. Will continue to watch for labs, fluids and IV access.

## 2019-07-03 NOTE — ED Notes (Signed)
Contacted ED pharm for help in getting pain meds for pt

## 2019-07-03 NOTE — Progress Notes (Signed)
Secure chat with two MD providers regarding fluid administration. Full dose of fluids per protocol not given  due to contraindication. Provider will reconsider if lactic acid is > 4.

## 2019-07-03 NOTE — ED Notes (Signed)
EDP at bedside for [place] IV

## 2019-07-03 NOTE — ED Notes (Signed)
Checking comp LR and Azithromycin with Pharm

## 2019-07-03 NOTE — ED Notes (Signed)
Unsuccessful IV attempt.

## 2019-07-03 NOTE — ED Triage Notes (Signed)
PT called911 from her bed at [facility] because of [facility]. PT is blind at base line and is O2 dependent 2 liters. EMS reported on arrival to PT bedside SPO2 was 80% on 2 liters . Nasal O2 incresed to 6 liters with SPO2 increased to 90's per EMS. Pt A/OL x4 .  Pt reported to EMS her room mate keeps the room to hot.

## 2019-07-03 NOTE — Progress Notes (Signed)
MD was able to obtain PIV with ultrasound IV team not needed at this time
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
## 2019-07-04 ENCOUNTER — Observation Stay (HOSPITAL_COMMUNITY): Payer: Medicare Other

## 2019-07-04 DIAGNOSIS — J9621 Acute and chronic respiratory failure with hypoxia: Secondary | ICD-10-CM | POA: Diagnosis not present

## 2019-07-04 DIAGNOSIS — R2681 Unsteadiness on feet: Secondary | ICD-10-CM | POA: Diagnosis not present

## 2019-07-04 DIAGNOSIS — R2689 Other abnormalities of gait and mobility: Secondary | ICD-10-CM | POA: Diagnosis not present

## 2019-07-04 DIAGNOSIS — H353290 Exudative age-related macular degeneration, unspecified eye, stage unspecified: Secondary | ICD-10-CM | POA: Diagnosis present

## 2019-07-04 DIAGNOSIS — J9600 Acute respiratory failure, unspecified whether with hypoxia or hypercapnia: Secondary | ICD-10-CM | POA: Diagnosis not present

## 2019-07-04 DIAGNOSIS — Z9981 Dependence on supplemental oxygen: Secondary | ICD-10-CM | POA: Diagnosis not present

## 2019-07-04 DIAGNOSIS — R0603 Acute respiratory distress: Secondary | ICD-10-CM | POA: Diagnosis not present

## 2019-07-04 DIAGNOSIS — R0600 Dyspnea, unspecified: Secondary | ICD-10-CM | POA: Diagnosis present

## 2019-07-04 DIAGNOSIS — J181 Lobar pneumonia, unspecified organism: Secondary | ICD-10-CM | POA: Diagnosis present

## 2019-07-04 DIAGNOSIS — M549 Dorsalgia, unspecified: Secondary | ICD-10-CM | POA: Diagnosis present

## 2019-07-04 DIAGNOSIS — Z85828 Personal history of other malignant neoplasm of skin: Secondary | ICD-10-CM | POA: Diagnosis not present

## 2019-07-04 DIAGNOSIS — Z801 Family history of malignant neoplasm of trachea, bronchus and lung: Secondary | ICD-10-CM | POA: Diagnosis not present

## 2019-07-04 DIAGNOSIS — R64 Cachexia: Secondary | ICD-10-CM | POA: Diagnosis present

## 2019-07-04 DIAGNOSIS — Z87891 Personal history of nicotine dependence: Secondary | ICD-10-CM | POA: Diagnosis not present

## 2019-07-04 DIAGNOSIS — Z886 Allergy status to analgesic agent status: Secondary | ICD-10-CM | POA: Diagnosis not present

## 2019-07-04 DIAGNOSIS — K559 Vascular disorder of intestine, unspecified: Secondary | ICD-10-CM | POA: Diagnosis not present

## 2019-07-04 DIAGNOSIS — R1312 Dysphagia, oropharyngeal phase: Secondary | ICD-10-CM | POA: Diagnosis not present

## 2019-07-04 DIAGNOSIS — D649 Anemia, unspecified: Secondary | ICD-10-CM | POA: Diagnosis not present

## 2019-07-04 DIAGNOSIS — E43 Unspecified severe protein-calorie malnutrition: Secondary | ICD-10-CM | POA: Diagnosis not present

## 2019-07-04 DIAGNOSIS — G894 Chronic pain syndrome: Secondary | ICD-10-CM | POA: Diagnosis not present

## 2019-07-04 DIAGNOSIS — Z433 Encounter for attention to colostomy: Secondary | ICD-10-CM | POA: Diagnosis not present

## 2019-07-04 DIAGNOSIS — J9611 Chronic respiratory failure with hypoxia: Secondary | ICD-10-CM | POA: Diagnosis not present

## 2019-07-04 DIAGNOSIS — M2550 Pain in unspecified joint: Secondary | ICD-10-CM | POA: Diagnosis not present

## 2019-07-04 DIAGNOSIS — J069 Acute upper respiratory infection, unspecified: Secondary | ICD-10-CM | POA: Diagnosis not present

## 2019-07-04 DIAGNOSIS — M6281 Muscle weakness (generalized): Secondary | ICD-10-CM | POA: Diagnosis not present

## 2019-07-04 DIAGNOSIS — J918 Pleural effusion in other conditions classified elsewhere: Secondary | ICD-10-CM | POA: Diagnosis present

## 2019-07-04 DIAGNOSIS — G8929 Other chronic pain: Secondary | ICD-10-CM | POA: Diagnosis present

## 2019-07-04 DIAGNOSIS — R0602 Shortness of breath: Secondary | ICD-10-CM | POA: Diagnosis not present

## 2019-07-04 DIAGNOSIS — Z681 Body mass index (BMI) 19 or less, adult: Secondary | ICD-10-CM | POA: Diagnosis not present

## 2019-07-04 DIAGNOSIS — I5033 Acute on chronic diastolic (congestive) heart failure: Secondary | ICD-10-CM | POA: Diagnosis not present

## 2019-07-04 DIAGNOSIS — Z20822 Contact with and (suspected) exposure to covid-19: Secondary | ICD-10-CM | POA: Diagnosis present

## 2019-07-04 DIAGNOSIS — M4854XA Collapsed vertebra, not elsewhere classified, thoracic region, initial encounter for fracture: Secondary | ICD-10-CM | POA: Diagnosis present

## 2019-07-04 DIAGNOSIS — J948 Other specified pleural conditions: Secondary | ICD-10-CM | POA: Diagnosis not present

## 2019-07-04 DIAGNOSIS — J90 Pleural effusion, not elsewhere classified: Secondary | ICD-10-CM

## 2019-07-04 DIAGNOSIS — Z7401 Bed confinement status: Secondary | ICD-10-CM | POA: Diagnosis not present

## 2019-07-04 DIAGNOSIS — Z8249 Family history of ischemic heart disease and other diseases of the circulatory system: Secondary | ICD-10-CM | POA: Diagnosis not present

## 2019-07-04 DIAGNOSIS — J449 Chronic obstructive pulmonary disease, unspecified: Secondary | ICD-10-CM | POA: Diagnosis not present

## 2019-07-04 DIAGNOSIS — D509 Iron deficiency anemia, unspecified: Secondary | ICD-10-CM | POA: Diagnosis not present

## 2019-07-04 DIAGNOSIS — Z91040 Latex allergy status: Secondary | ICD-10-CM | POA: Diagnosis not present

## 2019-07-04 DIAGNOSIS — I700 Atherosclerosis of aorta: Secondary | ICD-10-CM | POA: Diagnosis present

## 2019-07-04 DIAGNOSIS — R600 Localized edema: Secondary | ICD-10-CM | POA: Diagnosis not present

## 2019-07-04 DIAGNOSIS — Z743 Need for continuous supervision: Secondary | ICD-10-CM | POA: Diagnosis not present

## 2019-07-04 DIAGNOSIS — I110 Hypertensive heart disease with heart failure: Secondary | ICD-10-CM | POA: Diagnosis present

## 2019-07-04 DIAGNOSIS — Z833 Family history of diabetes mellitus: Secondary | ICD-10-CM | POA: Diagnosis not present

## 2019-07-04 DIAGNOSIS — E785 Hyperlipidemia, unspecified: Secondary | ICD-10-CM | POA: Diagnosis not present

## 2019-07-04 DIAGNOSIS — I959 Hypotension, unspecified: Secondary | ICD-10-CM | POA: Diagnosis not present

## 2019-07-04 DIAGNOSIS — J418 Mixed simple and mucopurulent chronic bronchitis: Secondary | ICD-10-CM | POA: Diagnosis not present

## 2019-07-04 HISTORY — PX: IR THORACENTESIS ASP PLEURAL SPACE W/IMG GUIDE: IMG5380

## 2019-07-04 LAB — BASIC METABOLIC PANEL
Anion gap: 10 (ref 5–15)
BUN: 7 mg/dL — ABNORMAL LOW (ref 8–23)
CO2: 31 mmol/L (ref 22–32)
Calcium: 9.2 mg/dL (ref 8.9–10.3)
Chloride: 99 mmol/L (ref 98–111)
Creatinine, Ser: 0.37 mg/dL — ABNORMAL LOW (ref 0.44–1.00)
GFR calc Af Amer: >60 mL/min (ref >60)
GFR calc non Af Amer: >60 mL/min (ref >60)
Glucose, Bld: 102 mg/dL — ABNORMAL HIGH (ref 70–99)
Potassium: 3.6 mmol/L (ref 3.5–5.1)
Sodium: 140 mmol/L (ref 135–145)

## 2019-07-04 LAB — CBC
HCT: 34.4 % — ABNORMAL LOW (ref 36.0–46.0)
Hemoglobin: 10.7 g/dL — ABNORMAL LOW (ref 12.0–15.0)
MCH: 27.7 pg (ref 26.0–34.0)
MCHC: 31.1 g/dL (ref 30.0–36.0)
MCV: 89.1 fL (ref 80.0–100.0)
Platelets: 457 10*3/uL — ABNORMAL HIGH (ref 150–400)
RBC: 3.86 MIL/uL — ABNORMAL LOW (ref 3.87–5.11)
RDW: 16.3 % — ABNORMAL HIGH (ref 11.5–15.5)
WBC: 6.6 10*3/uL (ref 4.0–10.5)
nRBC: 0 % (ref 0.0–0.2)

## 2019-07-04 LAB — BODY FLUID CELL COUNT WITH DIFFERENTIAL
Lymphs, Fluid: 9 %
Monocyte-Macrophage-Serous Fluid: 20 % — ABNORMAL LOW (ref 50–90)
Neutrophil Count, Fluid: 71 % — ABNORMAL HIGH (ref 0–25)
Total Nucleated Cell Count, Fluid: 310 cu mm (ref 0–1000)

## 2019-07-04 LAB — GRAM STAIN

## 2019-07-04 LAB — ECHOCARDIOGRAM COMPLETE
Height: 64 in
Weight: 1689.61 oz

## 2019-07-04 LAB — GLUCOSE, PLEURAL OR PERITONEAL FLUID: Glucose, Fluid: 106 mg/dL

## 2019-07-04 LAB — ALBUMIN, PLEURAL OR PERITONEAL FLUID: Albumin, Fluid: 1.6 g/dL

## 2019-07-04 MED ORDER — LIDOCAINE HCL 1 % IJ SOLN
INTRAMUSCULAR | Status: AC
  Filled 2019-07-04: qty 20

## 2019-07-04 MED ORDER — LIDOCAINE HCL (PF) 1 % IJ SOLN
INTRAMUSCULAR | Status: DC | PRN
  Administered 2019-07-04: 10 mL

## 2019-07-04 NOTE — Progress Notes (Signed)
PROGRESS NOTE  [name] Bell QPY:[id number] DOB: July 11, 1945 DOA: 07/03/2019 PCP: Hendricks Limes, MD   LOS: 0 days   Brief Narrative / Interim history: 74 year old female with HTN, HLD, chronic respiratory failure on 2 L at home, legally blind, ischemic colitis, history of PUD, small bowel perforation status post resection with residual colostomy, chronic pain came into the hospital with worsening shortness of breath over the last 3 to 4 days.  She also reports having a productive cough.  She also noted left-sided chest pressure and pain along the left flank when trying to take a deep breath.  Of note, her Lasix was recently changed to as needed due to hypotension.  Her metoprolol has been increased from 12.5-25 just a few days ago for occasional sinus tach.  It also looks like in April 2021 had difficulties breathing and a chest x-ray at that time showed left-sided pleural effusion, along with concern for left lower lobe infiltrate.  Subjective / 24h Interval events: She is still feeling short of breath this morning.  Denies any chest pain at rest but only when she takes a deep breath  Assessment & Plan: Principal Problem Acute on chronic hypoxic respiratory failure, left-sided pleural effusion-there is concern for recurrent pneumonia versus other.  Initially patient refused thoracentesis however I have talked to her again this morning and she is status post thoracentesis with 1 L fluid removed, labs pending.  Follow-up chest x-ray with significant improvement, no pneumothorax.  Active Problems Acute on chronic diastolic CHF-possibly causing the left-sided pleural effusion also, we will repeat a 2D echo since has been about 3 years since she has had the study done.  COPD, acute on chronic hypoxic respiratory failure-no wheezing, COPD seems to be quiesced sent.  Suspected lobar pneumonia-patient has a history of left-sided pneumonia couple of months ago.  She was started on cefepime on  admission, continue all cultures are pending.  Sinus tachycardia-continue home metoprolol  Chronic pain with long-term opiate use-continue home regimen  Iron deficiency anemia-hemoglobin overall stable  History of ischemic colitis, status post colostomy-overall stable  Hyperlipidemia-continue statin   Scheduled Meds: . docusate sodium  100 mg Oral BID  . DULoxetine  60 mg Oral Daily  . enoxaparin (LOVENOX) injection  30 mg Subcutaneous Q24H  . lidocaine      . metoprolol tartrate  25 mg Oral BID  . mirtazapine  15 mg Oral QHS  . pravastatin  40 mg Oral Daily  . sodium chloride flush  3 mL Intravenous Q12H   Continuous Infusions: . ceFEPime (MAXIPIME) IV 2 g (07/04/19 0504)   PRN Meds:.acetaminophen **OR** acetaminophen, albuterol, cyclobenzaprine, HYDROcodone-acetaminophen, lidocaine (PF), morphine, ondansetron **OR** ondansetron (ZOFRAN) IV  DVT prophylaxis: Lovenox Code Status: Full code Family Communication: no family at bedside   Status is: Observation  The patient will require care spanning > 2 midnights and should be moved to inpatient because: Persistent respiratory failure this morning, remains symptomatic with shortness of breath, needs further IV antibiotics and updated 2D echo  Dispo: The patient is from: SNF              Anticipated d/c is to: SNF              Anticipated d/c date is: 2 days              Patient currently is not medically stable to d/c.  Consultants:  None   Procedures:  2D echo: pending  Microbiology  None   Antimicrobials: Cefepime  6/7 >>    Objective: Vitals:   07/04/19 0339 07/04/19 0747 07/04/19 0849 07/04/19 0850  BP: (!) 112/56 (!) 122/49 (!) 105/50 (!) 105/50  Pulse: 96 96 93 92  Resp: 18 16  18   Temp:  98.2 F (36.8 C)  98.3 F (36.8 C)  TempSrc: Oral Oral  Oral  SpO2: 96% 96%  94%  Weight: 47.9 kg     Height:        Intake/Output Summary (Last 24 hours) at 07/04/2019 1046 Last data filed at 07/04/2019 1023 Gross  per 24 hour  Intake 915.38 ml  Output 1950 ml  Net -1034.62 ml   Filed Weights   07/03/19 1218 07/03/19 2002 07/04/19 0339  Weight: 41.3 kg 48 kg 47.9 kg    Examination:  Constitutional: Tachypneic at times Eyes: no scleral icterus ENMT: Mucous membranes are moist.  Neck: normal, supple Respiratory: Diminished at the bases left greater than right, no wheezing heard Cardiovascular: Regular rate and rhythm, no murmurs / rubs / gallops.  Trace edema Abdomen: non distended, no tenderness. Bowel sounds positive.  Musculoskeletal: no clubbing / cyanosis.  Skin: no rashes Neurologic: Nonfocal   Data Reviewed: I have independently reviewed following labs and imaging studies   CBC: Recent Labs  Lab 07/03/19 1325 07/03/19 1423 07/04/19 0639  WBC 8.3  --  6.6  NEUTROABS 6.4  --   --   HGB 11.8* 11.9* 10.7*  HCT 40.0 35.0* 34.4*  MCV 93.2  --  89.1  PLT 387  --  009*   Basic Metabolic Panel: Recent Labs  Lab 07/03/19 1325 07/03/19 1423 07/04/19 0639  NA 135 136 140  K 3.6 3.9 3.6  CL 93*  --  99  CO2 29  --  31  GLUCOSE 109*  --  102*  BUN 7*  --  7*  CREATININE 0.37*  --  0.37*  CALCIUM 9.3  --  9.2   Liver Function Tests: No results for input(s): AST, ALT, ALKPHOS, BILITOT, PROT, ALBUMIN in the last 168 hours. Coagulation Profile: Recent Labs  Lab 07/03/19 1624  INR 1.0   HbA1C: No results for input(s): HGBA1C in the last 72 hours. CBG: No results for input(s): GLUCAP in the last 168 hours.  Recent Results (from the past 240 hour(s))  SARS Coronavirus 2 by RT PCR (hospital order, performed in [facility] hospital lab) Nasopharyngeal Nasopharyngeal Swab     Status: None   Collection Time: 07/03/19  3:07 PM   Specimen: Nasopharyngeal Swab  Result Value Ref Range Status   SARS Coronavirus 2 NEGATIVE NEGATIVE Final    Comment: (NOTE) SARS-CoV-2 target nucleic acids are NOT DETECTED. The SARS-CoV-2 RNA is generally detectable in upper and lower respiratory  specimens during the acute phase of infection. The lowest concentration of SARS-CoV-2 viral copies this assay can detect is 250 copies / mL. A negative result does not preclude SARS-CoV-2 infection and should not be used as the sole basis for treatment or other patient management decisions.  A negative result may occur with improper specimen collection / handling, submission of specimen other than nasopharyngeal swab, presence of viral mutation(s) within the areas targeted by this assay, and inadequate number of viral copies (<250 copies / mL). A negative result must be combined with clinical observations, patient history, and epidemiological information. Fact Sheet for Patients:   StrictlyIdeas.no Fact Sheet for Healthcare Providers: BankingDealers.co.za This test is not yet approved or cleared  by the [place] FDA and has been  authorized for detection and/or diagnosis of SARS-CoV-2 by FDA under an Emergency Use Authorization (EUA).  This EUA will remain in effect (meaning this test can be used) for the duration of the COVID-19 declaration under Section 564(b)(1) of the Act, 21 U.S.C. section 360bbb-3(b)(1), unless the authorization is terminated or revoked sooner. Performed at [facility], [place] [address] [place], [place] [address]   Blood Culture (routine x 2)     Status: None (Preliminary result)   Collection Time: 07/03/19  4:24 PM   Specimen: BLOOD RIGHT ARM  Result Value Ref Range Status   Specimen Description BLOOD RIGHT ARM  Final   Special Requests   Final    BOTTLES DRAWN AEROBIC ONLY Blood Culture results may not be optimal due to an inadequate volume of blood received in culture bottles   Culture   Final    NO GROWTH < 24 HOURS Performed at [facility], [place] [address], [place], [place] [address]    Report Status PENDING  Incomplete  Blood Culture (routine x 2)     Status: None (Preliminary result)    Collection Time: 07/03/19  4:24 PM   Specimen: BLOOD  Result Value Ref Range Status   Specimen Description BLOOD SITE NOT SPECIFIED  Final   Special Requests   Final    BOTTLES DRAWN AEROBIC AND ANAEROBIC Blood Culture adequate volume   Culture   Final    NO GROWTH < 24 HOURS Performed at [facility], [place] [address] [place], [place] [address]    Report Status PENDING  Incomplete     Radiology Studies: CT ANGIO CHEST PE W OR WO CONTRAST  Result Date: 07/03/2019 CLINICAL DATA:  Shortness of breath, hypoxic, elevated D dimer EXAM: CT ANGIOGRAPHY CHEST WITH CONTRAST TECHNIQUE: Multidetector CT imaging of the chest was performed using the standard protocol during bolus administration of intravenous contrast. Multiplanar CT image reconstructions and MIPs were obtained to evaluate the vascular anatomy. CONTRAST:  24mL OMNIPAQUE IOHEXOL 350 MG/ML SOLN COMPARISON:  07/03/2019 FINDINGS: Cardiovascular: This is a technically adequate evaluation of the pulmonary vasculature. There are no filling defects or acute pulmonary emboli. 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 The heart is not enlarged. There is a small pericardial effusion. Normal caliber of the thoracic aorta. Extensive atherosclerosis of the aorta and coronary vasculature. Mediastinum/Nodes: No enlarged mediastinal, hilar, or axillary lymph nodes. Thyroid gland, trachea, and esophagus demonstrate no significant findings. Lungs/Pleura: There is a large left pleural effusion volume estimated approximately 2 L. There is extensive background emphysema. There is complete compressive atelectasis of the left lower lobe. Right lower lobe atrophy is noted as described above, with volume loss and right lower lobe bronchial wall thickening. Upper Abdomen: No acute abnormality. Musculoskeletal: No acute or destructive bony lesions. Chronic T5, T6, T8, and T12  compression deformities are noted. Reconstructed images confirm the above findings. Review of the MIP images confirms the above findings. IMPRESSION: 1. No acute pulmonary embolus. 2. Large left pleural effusion volume estimated 2 L. Complete compressive atelectasis of the left lower lobe. 3. Aortic Atherosclerosis (ICD10-I70.0) and Emphysema (ICD10-J43.9). 4. Trace pericardial effusion. Electronically Signed   By: Randa Ngo M.D.   On: 07/03/2019 19:37   DG Chest Port 1 View  Result Date: 07/03/2019 CLINICAL DATA:  Short of breath and left flank pain with deep inspiration. EXAM: PORTABLE CHEST 1 VIEW COMPARISON:  04/21/2019 and older studies. FINDINGS: There is opacity at the left lung base obscuring the hemidiaphragm, with additional hazy opacity extending throughout much of the left hemithorax. Mild hazy opacity is noted at the right lung base. Lungs also demonstrate prominent bronchovascular markings. Cardiac silhouette is normal in size. No mediastinal or hilar masses. No pneumothorax. Skeletal structures are demineralized. Vertebral compression deformities, not well visualized on this AP study. IMPRESSION: 1. Stable appearance of the chest radiograph since the most recent prior study. Findings consistent with moderate left and small right pleural effusions with associated atelectasis. Can not exclude a component of pneumonia. No convincing pulmonary edema. Electronically Signed   By: Lajean Manes M.D.   On: 07/03/2019 14:32    Marzetta Board, MD, PhD [facility]  Between 7 am - 7 pm I am available, please contact me via Amion or Securechat  Between 7 pm - 7 am I am not available, please contact night coverage MD/APP via Amion

## 2019-07-04 NOTE — Progress Notes (Signed)
Patient wanted to clarify NPO status. Paged SLP and heard no response for a speech evaluation on patient. Paged and notified MD.

## 2019-07-04 NOTE — Progress Notes (Signed)
Patient request and verbalizes consent to update's friend on her emergency contact. Spoke to [employer], and updated her that the patient is having a thoracentesis and answered questions in regards to patient's current breathing status.

## 2019-07-04 NOTE — Progress Notes (Signed)
*  PRELIMINARY RESULTS* Echocardiogram 2D Echocardiogram has been performed.  Leavy Cella 07/04/2019, 1:55 PM
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
## 2019-07-04 NOTE — Procedures (Signed)
PROCEDURE SUMMARY:  Successful image-guided left thoracentesis. Yielded 1.0 liters of clear yellow fluid. Patient tolerated procedure well. EBL: Zero No immediate complications.  Specimen was sent for labs. Post procedure CXR shows no pneumothorax.  Please see imaging section of Epic for full dictation.  Joaquim Nam PA-C 07/04/2019 10:21 AM

## 2019-07-05 DIAGNOSIS — Z79891 Long term (current) use of opiate analgesic: Secondary | ICD-10-CM

## 2019-07-05 LAB — COMPREHENSIVE METABOLIC PANEL
ALT: 18 U/L (ref 0–44)
AST: 17 U/L (ref 15–41)
Albumin: 2.1 g/dL — ABNORMAL LOW (ref 3.5–5.0)
Alkaline Phosphatase: 319 U/L — ABNORMAL HIGH (ref 38–126)
Anion gap: 9 (ref 5–15)
BUN: 5 mg/dL — ABNORMAL LOW (ref 8–23)
CO2: 32 mmol/L (ref 22–32)
Calcium: 9.1 mg/dL (ref 8.9–10.3)
Chloride: 99 mmol/L (ref 98–111)
Creatinine, Ser: 0.37 mg/dL — ABNORMAL LOW (ref 0.44–1.00)
GFR calc Af Amer: >60 mL/min (ref >60)
GFR calc non Af Amer: >60 mL/min (ref >60)
Glucose, Bld: 111 mg/dL — ABNORMAL HIGH (ref 70–99)
Potassium: 3.8 mmol/L (ref 3.5–5.1)
Sodium: 140 mmol/L (ref 135–145)
Total Bilirubin: 0.5 mg/dL (ref 0.3–1.2)
Total Protein: 5.2 g/dL — ABNORMAL LOW (ref 6.5–8.1)

## 2019-07-05 LAB — CBC
HCT: 32.9 % — ABNORMAL LOW (ref 36.0–46.0)
Hemoglobin: 10.2 g/dL — ABNORMAL LOW (ref 12.0–15.0)
MCH: 27.7 pg (ref 26.0–34.0)
MCHC: 31 g/dL (ref 30.0–36.0)
MCV: 89.4 fL (ref 80.0–100.0)
Platelets: 458 10*3/uL — ABNORMAL HIGH (ref 150–400)
RBC: 3.68 MIL/uL — ABNORMAL LOW (ref 3.87–5.11)
RDW: 16.1 % — ABNORMAL HIGH (ref 11.5–15.5)
WBC: 6 10*3/uL (ref 4.0–10.5)
nRBC: 0 % (ref 0.0–0.2)

## 2019-07-05 LAB — CYTOLOGY - NON PAP

## 2019-07-05 MED ORDER — FUROSEMIDE 20 MG PO TABS
20.0000 mg | ORAL_TABLET | Freq: Every day | ORAL | Status: DC
  Administered 2019-07-05 – 2019-07-07 (×3): 20 mg via ORAL
  Filled 2019-07-05 (×3): qty 1

## 2019-07-05 MED ORDER — HYDROCODONE-ACETAMINOPHEN 7.5-325 MG PO TABS: 1.0000 | ORAL_TABLET | Freq: Four times a day (QID) | ORAL | 0 refills | Status: DC | PRN

## 2019-07-05 MED ORDER — DOXYCYCLINE HYCLATE 100 MG PO CAPS: 100.0000 mg | ORAL_CAPSULE | Freq: Two times a day (BID) | ORAL | 0 refills | Status: AC

## 2019-07-05 MED ORDER — FUROSEMIDE 20 MG PO TABS: 20.0000 mg | ORAL_TABLET | Freq: Every day | ORAL | Status: DC

## 2019-07-05 MED ORDER — MORPHINE SULFATE ER 15 MG PO TBCR: 15.0000 mg | EXTENDED_RELEASE_TABLET | Freq: Two times a day (BID) | ORAL | 0 refills | Status: DC | PRN

## 2019-07-05 NOTE — Social Work (Signed)
CSW sent discharge summary to [facility] and contacted admissions liaison to inform her the information has been sent. CSW was then informed that due to patient's [facility] insurance, authorization will be needed to return to facility. CSW expressed the previous phone call was to inquire on any needs before patient's return, and considering this will likely delay patient's discharge.   CSW submitting new insurance authorization for patient.  [name] Bell, [place] Education officer, museum

## 2019-07-05 NOTE — Social Work (Signed)
CSW spoke with [facility] and confirmed patient is able to return to facility today. Discharge summary is only item needed for discharge.   Criss Alvine, [facility] Social Worker

## 2019-07-05 NOTE — Evaluation (Signed)
Physical Therapy Evaluation Patient Details Name: [name] Bell MRN: [id number] DOB: Dec 07, 1945 Today's Date: 07/05/2019   History of Present Illness  74 y.o. female female with medical history significant of hypertension, hyperlipidemia, chronic respiratory failure on 2 L of nasal cannula oxygen, legally blind, ischemic colitis, duodenal ulcer, small bowel perforation s/p resection with residual colostomy, and chronic pain. Admitted on 07/03/19 for acute on chronic hypoxic respiratory failure, left-sided pleural effusion resultant of chronic diastolic CHF and COPD.    Clinical Impression  Pt presents with an overall decrease in functional mobility, generalized weakness and cardiopulmonary endurance  secondary to above. PTA, pt lives at [facility]. Today, pt able to complete bed mobility supervision and sit to stand transfers min(A), with emphasis in assistance with power up. Pt able to amb to chair with min (A) for safety and stability due to high fall risk. Blood pressure monitored during session due to reports of dizziness with mobility (see below). Pt would benefit from continued acute PT services to maximize functional mobility and independence prior to d/c to SNF.  BP supine 93/36 BP after supine therex 101/46 BP at EOB 102/61 BP in after transfer 108/93    Follow Up Recommendations SNF    Equipment Recommendations  None recommended by PT    Recommendations for Other Services       Precautions / Restrictions Precautions Precautions: Fall Precaution Comments: Low BP      Mobility  Bed Mobility Overal bed mobility: Needs Assistance Bed Mobility: Supine to Sit     Supine to sit: Supervision     General bed mobility comments: Pt required supervision for bed mobility  Transfers Overall transfer level: Needs assistance Equipment used: Rolling walker (2 wheeled) Transfers: Sit to/from Stand Sit to Stand: Min assist         General transfer comment: Pt required min(A) for  power up with sit to stand  Ambulation/Gait Ambulation/Gait assistance: Min assist Gait Distance (Feet): 4 Feet Assistive device: Rolling walker (2 wheeled) Gait Pattern/deviations: Step-to pattern;Decreased stride length;Wide base of support     General Gait Details: Pt required min(A) for assistance with negotiating rolling walker to recliner chair. Pt requested to stand while at recliner chair and was able to stand for ~27min  Stairs            Wheelchair Mobility    Modified Rankin (Stroke Patients Only)       Balance Overall balance assessment: Needs assistance Sitting-balance support: Bilateral upper extremity supported Sitting balance-Leahy Scale: Good Sitting balance - Comments: Pt able to sit EOB without assistance, and scoot body to EOB with no assistance     Standing balance-Leahy Scale: Poor Standing balance comment: Pt required assist for sit to stand and bilateral UE support                             Pertinent Vitals/Pain Pain Assessment: Faces Pain Score: 9  Pain Location: abdomen with movement Pain Descriptors / Indicators: Aching;Discomfort;Dull;Grimacing;Guarding Pain Intervention(s): Limited activity within patient's tolerance;Monitored during session;Repositioned    Home Living Family/patient expects to be discharged to:: Skilled nursing facility                      Prior Function Level of Independence: Needs assistance   Gait / Transfers Assistance Needed: Walks short distances with walker, reports she only ambulates with staff assistance at [facility]  Hand Dominance        Extremity/Trunk Assessment   Upper Extremity Assessment Upper Extremity Assessment: Overall WFL for tasks assessed    Lower Extremity Assessment Lower Extremity Assessment: Generalized weakness    Cervical / Trunk Assessment Cervical / Trunk Assessment: Kyphotic  Communication   Communication: No difficulties  Cognition  Arousal/Alertness: Awake/alert Behavior During Therapy: WFL for tasks assessed/performed Overall Cognitive Status: Within Functional Limits for tasks assessed                                 General Comments: Pt alert and oriented, pt required minimal verbal cues for directions and mobility      General Comments General comments (skin integrity, edema, etc.): pt wound on LLE sore started to ooze blood through bandage after transfering to chair, cleaned LE up and nursing aware. Pt vital signs monitored during session.    Exercises Total Joint Exercises Ankle Circles/Pumps: AROM;Other reps (comment);Both;Supine(30) General Exercises - Upper Extremity Elbow Flexion: AROM;Both;10 reps Elbow Extension: AROM;10 reps;Both   Assessment/Plan    PT Assessment Patient needs continued PT services  PT Problem List Decreased strength;Decreased mobility;Decreased range of motion;Decreased coordination;Decreased activity tolerance;Cardiopulmonary status limiting activity;Decreased balance;Decreased knowledge of use of DME;Pain       PT Treatment Interventions DME instruction;Therapeutic activities;Gait training;Therapeutic exercise;Patient/family education;Balance training;Functional mobility training    PT Goals (Current goals can be found in the Care Plan section)  Acute Rehab PT Goals Patient Stated Goal: return to SNF PT Goal Formulation: With patient Time For Goal Achievement: 07/19/19 Potential to Achieve Goals: Good    Frequency Min 2X/week   Barriers to discharge   none noted    Co-evaluation               AM-PAC PT "6 Clicks" Mobility  Outcome Measure Help needed turning from your back to your side while in a flat bed without using bedrails?: None Help needed moving from lying on your back to sitting on the side of a flat bed without using bedrails?: None Help needed moving to and from a bed to a chair (including a wheelchair)?: A Little Help needed  standing up from a chair using your arms (e.g., wheelchair or bedside chair)?: A Little Help needed to walk in hospital room?: A Little Help needed climbing 3-5 steps with a railing? : A Lot 6 Click Score: 19    End of Session Equipment Utilized During Treatment: Gait belt;Oxygen Activity Tolerance: Patient tolerated treatment well Patient left: in chair;with call bell/phone within reach;with chair alarm set Nurse Communication: Mobility status PT Visit Diagnosis: Unsteadiness on feet (R26.81);Difficulty in walking, not elsewhere classified (R26.2);Muscle weakness (generalized) (M62.81);Pain Pain - part of body: (abdomen)    Time: 6789-3810 PT Time Calculation (min) (ACUTE ONLY): 30 min   Charges:   PT Evaluation $PT Eval Moderate Complexity: 1 Mod PT Treatments $Therapeutic Activity: 8-22 mins        [employer] SPT 07/05/2019   Rolland Porter 07/05/2019, 1:19 PM

## 2019-07-05 NOTE — Discharge Summary (Signed)
Physician Discharge Summary  [name] Bell OJJ:[id number] DOB: 1945/07/14 DOA: 07/03/2019  PCP: Hendricks Limes, MD  Admit date: 07/03/2019 Discharge date: 07/05/2019  Admitted From: SNF Disposition:  SNF  Recommendations for Outpatient Follow-up:  1. Follow up with PCP in 1-2 weeks 2. Please continue daily Lasix, monitor blood pressure closely, daily weights if able 3. PCP follow-up cytology on pleural effusion  Home Health: None Equipment/Devices: None  Discharge Condition: Stable CODE STATUS: Full code Diet recommendation: Low-sodium diet  HPI: Per admitting MD, [name] Bell is a 74 y.o. female with medical history significant of hypertension, hyperlipidemia, chronic respiratory failure on 2 L of nasal cannula oxygen, legally blind, ischemic colitis, duodenal ulcer, small bowel perforation s/p resection with residual colostomy, and chronic pain who presented with complaint of intermittent shortness of breath over the last 3 to 4 days.  Breathing treatments seem to help intermittently.  She reports having a productive cough.  Noted associated symptoms of left-sided chest pressure and pain along the left flank when trying to take deep inspiratory breath.  Denies any previous history of blood clots or calf pain.  She is not on any anticoagulants.  Review of records notes that patient's Lasix had recently been changed to as needed due to hypotension and her metoprolol was increased from 12.5 mg twice daily to 25 mg twice daily on 6/4.  Hospital Course / Discharge diagnoses: Principal Problem Acute on chronic hypoxic respiratory failure, left-sided pleural effusion -this is likely multifactorial, concern for recurrent pneumonia given slight infiltrate in the left lung base, possibly contributing acute on chronic diastolic CHF.  Patient received IV Lasix without much improvement in her respiratory status and eventually underwent a thoracentesis on 6/8 with 1 L of fluid removal.  This is  possibly a parapneumonic effusion.  After thoracentesis respiratory status has improved significantly and she has returned to baseline.  Would recommend to continue antibiotics to cover for possible pneumonia for 5 additional days.  Of note, cytology has been sent to the effusion and is pending at the time of discharge  Active Problems Acute on chronic diastolic CHF-possibly causing the left-sided pleural effusion also, she underwent a repeat 2D echo which showed normal EF and grade 2 diastolic dysfunction.  Would recommend patient does get daily Lasix/daily weights, if the blood pressure allows. COPD, acute on chronic hypoxic respiratory failure-no wheezing, COPD seems to be controlled right now t. Suspected lobar pneumonia-patient has a history of left-sided pneumonia couple of months ago.  She was transitioned to doxycycline to complete 5 additional days as an outpatient for a total of 7-day course. Sinus tachycardia-continue home metoprolol Chronic pain with long-term opiate use-continue home regimen.  Consider alternatives to pain regiment since narcotics can drop the blood pressure Iron deficiency anemia-hemoglobin overall stable History of ischemic colitis, status post colostomy-overall stable Hyperlipidemia-continue statin  Discharge Instructions   Allergies as of 07/05/2019      Reactions   Strawberry (diagnostic) Rash   Duloxetine Nausea And Vomiting   Ibuprofen Other (See Comments)   REACTION: Bleeding ulcers   Nsaids Other (See Comments)   REACTION: Bleeding Ulcer   Latex Itching      Medication List    TAKE these medications   albuterol (2.5 MG/3ML) 0.083% nebulizer solution Commonly known as: PROVENTIL Take 3 mLs (2.5 mg total) by nebulization every 6 (six) hours as needed for wheezing or shortness of breath. What changed: Another medication with the same name was changed. Make sure you understand how  and when to take each.   albuterol 108 (90 Base) MCG/ACT  inhaler Commonly known as: VENTOLIN HFA TAKE 2 PUFFS BY MOUTH EVERY 6 HOURS AS NEEDED FOR WHEEZE OR SHORTNESS OF BREATH What changed: See the new instructions.   cyclobenzaprine 5 MG tablet Commonly known as: FLEXERIL Take 1 tablet (5 mg total) by mouth 2 (two) times daily as needed for muscle spasms.   docusate sodium 100 MG capsule Commonly known as: Colace Take 1 capsule (100 mg total) by mouth 2 (two) times daily. To prevent constipation while taking pain medication.   doxycycline 100 MG capsule Commonly known as: VIBRAMYCIN Take 1 capsule (100 mg total) by mouth 2 (two) times daily for 5 days.   DULoxetine 60 MG capsule Commonly known as: CYMBALTA Take 60 mg by mouth daily.   feeding supplement Liqd Commonly known as: BOOST / RESOURCE BREEZE Take 1 Container by mouth 2 (two) times daily.   furosemide 20 MG tablet Commonly known as: LASIX Take 1 tablet (20 mg total) by mouth daily. What changed:   when to take this  reasons to take this   HYDROcodone-acetaminophen 7.5-325 MG tablet Commonly known as: Norco Take 1 tablet by mouth every 6 (six) hours as needed for moderate pain or severe pain.   metoprolol tartrate 25 MG tablet Commonly known as: LOPRESSOR TAKE 1/2 TABLET TWICE A DAY What changed: how much to take   mirtazapine 15 MG tablet Commonly known as: REMERON Take 15 mg by mouth at bedtime.   morphine 15 MG 12 hr tablet Commonly known as: MS CONTIN Take 1 tablet (15 mg total) by mouth every 12 (twelve) hours as needed for pain.   multivitamin with minerals Tabs tablet Take 1 tablet by mouth daily.   ondansetron 4 MG tablet Commonly known as: ZOFRAN TAKE 1 TABLET BY MOUTH EVERY 8 HOURS AS NEEDED FOR NAUSEA AND VOMITING What changed: See the new instructions.   potassium chloride 10 MEQ tablet Commonly known as: KLOR-CON Take 20 mEq by mouth daily.   pravastatin 40 MG tablet Commonly known as: PRAVACHOL TAKE 1 TABLET BY MOUTH EVERY DAY       Follow-up Information    Hendricks Limes, MD. Schedule an appointment as soon as possible for a visit in 1 week(s).   Specialty: Internal Medicine Contact information: [place] [address] [phone]          Consultations:  None   Procedures/Studies:  DG Chest 1 View  Result Date: 07/04/2019 CLINICAL DATA:  Status post left pleural effusion. EXAM: CHEST  1 VIEW COMPARISON:  07/03/2019 FINDINGS: Significant reduction in left pleural fluid volume with small effusion remaining after thoracentesis. No pneumothorax. Stable chronic lung disease. Bibasilar scarring/atelectasis. IMPRESSION: Significant reduction in left pleural fluid volume after thoracentesis. No pneumothorax. Electronically Signed   By: Aletta Edouard M.D.   On: 07/04/2019 10:49   CT ANGIO CHEST PE W OR WO CONTRAST  Result Date: 07/03/2019 CLINICAL DATA:  Shortness of breath, hypoxic, elevated D dimer EXAM: CT ANGIOGRAPHY CHEST WITH CONTRAST TECHNIQUE: Multidetector CT imaging of the chest was performed using the standard protocol during bolus administration of intravenous contrast. Multiplanar CT image reconstructions and MIPs were obtained to evaluate the vascular anatomy. CONTRAST:  83mL OMNIPAQUE IOHEXOL 350 MG/ML SOLN COMPARISON:  07/03/2019 FINDINGS: Cardiovascular: This is a technically adequate evaluation of the pulmonary vasculature. There are no filling defects or acute pulmonary emboli. 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 The heart is not enlarged. There is a small pericardial effusion. Normal caliber of the thoracic aorta. Extensive atherosclerosis of the aorta and coronary vasculature. Mediastinum/Nodes: No enlarged mediastinal, hilar, or axillary lymph nodes. Thyroid gland, trachea, and esophagus demonstrate no significant findings. Lungs/Pleura: There is a large left pleural effusion volume  estimated approximately 2 L. There is extensive background emphysema. There is complete compressive atelectasis of the left lower lobe. Right lower lobe atrophy is noted as described above, with volume loss and right lower lobe bronchial wall thickening. Upper Abdomen: No acute abnormality. Musculoskeletal: No acute or destructive bony lesions. Chronic T5, T6, T8, and T12 compression deformities are noted. Reconstructed images confirm the above findings. Review of the MIP images confirms the above findings. IMPRESSION: 1. No acute pulmonary embolus. 2. Large left pleural effusion volume estimated 2 L. Complete compressive atelectasis of the left lower lobe. 3. Aortic Atherosclerosis (ICD10-I70.0) and Emphysema (ICD10-J43.9). 4. Trace pericardial effusion. Electronically Signed   By: Randa Ngo M.D.   On: 07/03/2019 19:37   DG Chest Port 1 View  Result Date: 07/03/2019 CLINICAL DATA:  Short of breath and left flank pain with deep inspiration. EXAM: PORTABLE CHEST 1 VIEW COMPARISON:  04/21/2019 and older studies. FINDINGS: There is opacity at the left lung base obscuring the hemidiaphragm, with additional hazy opacity extending throughout much of the left hemithorax. Mild hazy opacity is noted at the right lung base. Lungs also demonstrate prominent bronchovascular markings. Cardiac silhouette is normal in size. No mediastinal or hilar masses. No pneumothorax. Skeletal structures are demineralized. Vertebral compression deformities, not well visualized on this AP study. IMPRESSION: 1. Stable appearance of the chest radiograph since the most recent prior study. Findings consistent with moderate left and small right pleural effusions with associated atelectasis. Can not exclude a component of pneumonia. No convincing pulmonary edema. Electronically Signed   By: Lajean Manes M.D.   On: 07/03/2019 14:32   ECHOCARDIOGRAM COMPLETE  Result Date: 07/04/2019    ECHOCARDIOGRAM REPORT   Patient Name:   [name] Bell  Date of Exam: 07/04/2019 Medical Rec #:  [id number]      Height:       64.0 in Accession #:    [id number]     Weight:       105.6 lb Date of Birth:  1945-12-31      BSA:          1.491 m Patient Age:    24 years       BP:           113/44 mmHg Patient Gender: F              HR:           97 bpm. Exam Location:  Inpatient Procedure: 2D Echo Indications:    Acute Respiratory Insufficiency 518.82 / R06.89  History:        Patient has prior history of Echocardiogram examinations, most                 recent 04/02/2016. COPD, Signs/Symptoms:Dyspnea; Risk                 Factors:Dyslipidemia and Former Smoker. Pleural effusion.  Sonographer:    Leavy Cella Referring Phys: [place]  1. Left ventricular ejection fraction, by estimation, is 65 to 70%. The left ventricle has normal function. The left ventricle has no regional wall motion abnormalities.  Left ventricular diastolic parameters are consistent with Grade II diastolic dysfunction (pseudonormalization). Elevated left atrial pressure.  2. Right ventricular systolic function is normal. The right ventricular size is mildly enlarged. There is mildly elevated pulmonary artery systolic pressure. The estimated right ventricular systolic pressure is 74.2 mmHg.  3. The mitral valve is normal in structure. No evidence of mitral valve regurgitation.  4. The aortic valve was not well visualized. Aortic valve regurgitation is not visualized. No aortic stenosis is present.  5. The inferior vena cava is normal in size with greater than 50% respiratory variability, suggesting right atrial pressure of 3 mmHg. FINDINGS  Left Ventricle: Left ventricular ejection fraction, by estimation, is 65 to 70%. The left ventricle has normal function. The left ventricle has no regional wall motion abnormalities. The left ventricular internal cavity size was small. There is no left ventricular hypertrophy. Left ventricular diastolic parameters are consistent with Grade II  diastolic dysfunction (pseudonormalization). Elevated left atrial pressure. Right Ventricle: The right ventricular size is mildly enlarged. No increase in right ventricular wall thickness. Right ventricular systolic function is normal. There is mildly elevated pulmonary artery systolic pressure. The tricuspid regurgitant velocity is 3.06 m/s, and with an assumed right atrial pressure of 3 mmHg, the estimated right ventricular systolic pressure is 59.5 mmHg. Left Atrium: Left atrial size was normal in size. Right Atrium: Right atrial size was normal in size. Pericardium: There is no evidence of pericardial effusion. Presence of pericardial fat pad. Mitral Valve: The mitral valve is normal in structure. No evidence of mitral valve regurgitation. Tricuspid Valve: The tricuspid valve is normal in structure. Tricuspid valve regurgitation is trivial. Aortic Valve: The aortic valve was not well visualized. Aortic valve regurgitation is not visualized. No aortic stenosis is present. Pulmonic Valve: The pulmonic valve was not well visualized. Pulmonic valve regurgitation is not visualized. Aorta: The aortic root is normal in size and structure. Venous: The inferior vena cava is normal in size with greater than 50% respiratory variability, suggesting right atrial pressure of 3 mmHg. IAS/Shunts: The interatrial septum was not well visualized.  LEFT VENTRICLE PLAX 2D LVIDd:         3.13 cm Diastology LVIDs:         1.86 cm LV e' lateral:   5.98 cm/s LV PW:         1.03 cm LV E/e' lateral: 19.1 LV IVS:        0.52 cm LV e' medial:    5.55 cm/s                        LV E/e' medial:  20.5  RIGHT VENTRICLE RV S prime:     11.70 cm/s TAPSE (M-mode): 1.2 cm LEFT ATRIUM             Index       RIGHT ATRIUM          Index LA diam:        2.30 cm 1.54 cm/m  RA Area:     7.96 cm LA Vol (A2C):   54.7 ml 36.68 ml/m RA Volume:   14.60 ml 9.79 ml/m LA Vol (A4C):   36.5 ml 24.47 ml/m LA Biplane Vol: 45.4 ml 30.44 ml/m   AORTA Ao Root  diam: 2.80 cm MITRAL VALVE                TRICUSPID VALVE MV Area (PHT): 2.76 cm     TR Peak grad:  37.5 mmHg MV Decel Time: 275 msec     TR Vmax:        306.00 cm/s MV E velocity: 114.00 cm/s MV A velocity: 96.10 cm/s MV E/A ratio:  1.19 Oswaldo Milian MD Electronically signed by Oswaldo Milian MD Signature Date/Time: 07/04/2019/3:35:59 PM    Final    IR THORACENTESIS ASP PLEURAL SPACE W/IMG GUIDE  Result Date: 07/04/2019 INDICATION: Patient with history of pleurisy, COPD on 2 L of oxygen continuous at home presented to the ED yesterday with worsening dyspnea. Imaging shows large left pleural effusion. Request to IR for diagnostic and therapeutic thoracentesis. EXAM: ULTRASOUND GUIDED LEFT THORACENTESIS MEDICATIONS: 8 mL 1% lidocaine COMPLICATIONS: None immediate. PROCEDURE: An ultrasound guided thoracentesis was thoroughly discussed with the patient and questions answered. The benefits, risks, alternatives and complications were also discussed. The patient understands and wishes to proceed with the procedure. Written consent was obtained. Ultrasound was performed to localize and mark an adequate pocket of fluid in the left chest. The area was then prepped and draped in the normal sterile fashion. 1% Lidocaine was used for local anesthesia. Under ultrasound guidance a 6 Fr Safe-T-Centesis catheter was introduced. Thoracentesis was performed. The catheter was removed and a dressing applied. FINDINGS: A total of approximately 1.0 L of clear yellow fluid was removed. Samples were sent to the laboratory as requested by the clinical team. IMPRESSION: Successful ultrasound guided left thoracentesis yielding 1.0 L of pleural fluid. Read by Candiss Norse, PA-C Electronically Signed   By: Aletta Edouard M.D.   On: 07/04/2019 10:23     Subjective: - no chest pain, shortness of breath, no abdominal pain, nausea or vomiting.   Discharge Exam: BP (!) 98/41 (BP Location: Right Arm)   Pulse 87   Temp  97.9 F (36.6 C) (Oral)   Resp 19   Ht 5\' 4"  (1.626 m)   Wt 47.1 kg   LMP 08/25/1980   SpO2 99%   BMI 17.82 kg/m   General: Pt is alert, awake, not in acute distress Cardiovascular: RRR, S1/S2 +, no rubs, no gallops Respiratory: CTA bilaterally, no wheezing, no rhonchi Abdominal: Soft, NT, ND, bowel sounds + Extremities: no edema, no cyanosis  The results of significant diagnostics from this hospitalization (including imaging, microbiology, ancillary and laboratory) are listed below for reference.     Microbiology: Recent Results (from the past 240 hour(s))  SARS Coronavirus 2 by RT PCR (hospital order, performed in [facility] hospital lab) Nasopharyngeal Nasopharyngeal Swab     Status: None   Collection Time: 07/03/19  3:07 PM   Specimen: Nasopharyngeal Swab  Result Value Ref Range Status   SARS Coronavirus 2 NEGATIVE NEGATIVE Final    Comment: (NOTE) SARS-CoV-2 target nucleic acids are NOT DETECTED. The SARS-CoV-2 RNA is generally detectable in upper and lower respiratory specimens during the acute phase of infection. The lowest concentration of SARS-CoV-2 viral copies this assay can detect is 250 copies / mL. A negative result does not preclude SARS-CoV-2 infection and should not be used as the sole basis for treatment or other patient management decisions.  A negative result may occur with improper specimen collection / handling, submission of specimen other than nasopharyngeal swab, presence of viral mutation(s) within the areas targeted by this assay, and inadequate number of viral copies (<250 copies / mL). A negative result must be combined with clinical observations, patient history, and epidemiological information. Fact Sheet for Patients:   StrictlyIdeas.no Fact Sheet for Healthcare Providers: BankingDealers.co.za This test is not  yet approved or cleared  by the [place] and has been authorized for  detection and/or diagnosis of SARS-CoV-2 by FDA under an Emergency Use Authorization (EUA).  This EUA will remain in effect (meaning this test can be used) for the duration of the COVID-19 declaration under Section 564(b)(1) of the Act, 21 U.S.C. section 360bbb-3(b)(1), unless the authorization is terminated or revoked sooner. Performed at [facility], [place] [address], [place], [place] [address]   Blood Culture (routine x 2)     Status: None (Preliminary result)   Collection Time: 07/03/19  4:24 PM   Specimen: BLOOD RIGHT ARM  Result Value Ref Range Status   Specimen Description BLOOD RIGHT ARM  Final   Special Requests   Final    BOTTLES DRAWN AEROBIC ONLY Blood Culture results may not be optimal due to an inadequate volume of blood received in culture bottles   Culture   Final    NO GROWTH 2 DAYS Performed at [facility], [place] [address], [place], [place] [address]    Report Status PENDING  Incomplete  Blood Culture (routine x 2)     Status: None (Preliminary result)   Collection Time: 07/03/19  4:24 PM   Specimen: BLOOD  Result Value Ref Range Status   Specimen Description BLOOD SITE NOT SPECIFIED  Final   Special Requests   Final    BOTTLES DRAWN AEROBIC AND ANAEROBIC Blood Culture adequate volume   Culture   Final    NO GROWTH 2 DAYS Performed at [facility], [address] [place], [place] [address]    Report Status PENDING  Incomplete  Gram stain     Status: None   Collection Time: 07/04/19 10:52 AM   Specimen: Lung, Left; Pleural Fluid  Result Value Ref Range Status   Specimen Description PLEURAL  Final   Special Requests ABDOMEN  Final   Gram Stain   Final    WBC PRESENT,BOTH PMN AND MONONUCLEAR NO ORGANISMS SEEN CYTOSPIN SMEAR Performed at [facility], [address], [place], [place] [address]    Report Status 07/04/2019 FINAL  Final  Culture, body fluid-bottle     Status: None (Preliminary result)   Collection Time: 07/04/19 10:52  AM   Specimen: Peritoneal Washings  Result Value Ref Range Status   Specimen Description PERITONEAL FLUID  Final   Special Requests ABDOMEN  Final   Culture   Final    NO GROWTH < 24 HOURS Performed at [facility], [place] [address] [place], [place] [address]    Report Status PENDING  Incomplete     Labs: Basic Metabolic Panel: Recent Labs  Lab 07/03/19 1325 07/03/19 1423 07/04/19 0639 07/05/19 0447  NA 135 136 140 140  K 3.6 3.9 3.6 3.8  CL 93*  --  99 99  CO2 29  --  31 32  GLUCOSE 109*  --  102* 111*  BUN 7*  --  7* 5*  CREATININE 0.37*  --  0.37* 0.37*  CALCIUM 9.3  --  9.2 9.1   Liver Function Tests: Recent Labs  Lab 07/05/19 0447  AST 17  ALT 18  ALKPHOS 319*  BILITOT 0.5  PROT 5.2*  ALBUMIN 2.1*   CBC: Recent Labs  Lab 07/03/19 1325 07/03/19 1423 07/04/19 0639 07/05/19 0447  WBC 8.3  --  6.6 6.0  NEUTROABS 6.4  --   --   --   HGB 11.8* 11.9* 10.7*  10.2*  HCT 40.0 35.0* 34.4* 32.9*  MCV 93.2  --  89.1 89.4  PLT 387  --  457* 458*   CBG: No results for input(s): GLUCAP in the last 168 hours. Hgb A1c No results for input(s): HGBA1C in the last 72 hours. Lipid Profile No results for input(s): CHOL, HDL, LDLCALC, TRIG, CHOLHDL, LDLDIRECT in the last 72 hours. Thyroid function studies No results for input(s): TSH, T4TOTAL, T3FREE, THYROIDAB in the last 72 hours.  Invalid input(s): FREET3 Urinalysis    Component Value Date/Time   COLORURINE YELLOW 07/03/2019 1624   APPEARANCEUR CLEAR 07/03/2019 1624   LABSPEC 1.010 07/03/2019 1624   PHURINE 6.0 07/03/2019 1624   GLUCOSEU NEGATIVE 07/03/2019 1624   GLUCOSEU NEG mg/dL 02/08/2007 2124   HGBUR NEGATIVE 07/03/2019 1624   BILIRUBINUR NEGATIVE 07/03/2019 1624   KETONESUR NEGATIVE 07/03/2019 1624   PROTEINUR NEGATIVE 07/03/2019 1624   UROBILINOGEN 0.2 10/11/2013 1640   NITRITE NEGATIVE 07/03/2019 1624   LEUKOCYTESUR NEGATIVE 07/03/2019 1624    FURTHER DISCHARGE INSTRUCTIONS:   Get  Medicines reviewed and adjusted: Please take all your medications with you for your next visit with your Primary MD   Laboratory/radiological data: Please request your Primary MD to go over all hospital tests and procedure/radiological results at the follow up, please ask your Primary MD to get all Hospital records sent to his/her office.   In some cases, they will be blood work, cultures and biopsy results pending at the time of your discharge. Please request that your primary care M.D. goes through all the records of your hospital data and follows up on these results.   Also Note the following: If you experience worsening of your admission symptoms, develop shortness of breath, life threatening emergency, suicidal or homicidal thoughts you must seek medical attention immediately by calling 911 or calling your MD immediately  if symptoms less severe.   You must read complete instructions/literature along with all the possible adverse reactions/side effects for all the Medicines you take and that have been prescribed to you. Take any new Medicines after you have completely understood and accpet all the possible adverse reactions/side effects.    Do not drive when taking Pain medications or sleeping medications (Benzodaizepines)   Do not take more than prescribed Pain, Sleep and Anxiety Medications. It is not advisable to combine anxiety,sleep and pain medications without talking with your primary care practitioner   Special Instructions: If you have smoked or chewed Tobacco  in the last 2 yrs please stop smoking, stop any regular Alcohol  and or any Recreational drug use.   Wear Seat belts while driving.   Please note: You were cared for by a hospitalist during your hospital stay. Once you are discharged, your primary care physician will handle any further medical issues. Please note that NO REFILLS for any discharge medications will be authorized once you are discharged, as it is imperative  that you return to your primary care physician (or establish a relationship with a primary care physician if you do not have one) for your post hospital discharge needs so that they can reassess your need for medications and monitor your lab values.  Time coordinating discharge: 40 minutes  SIGNED:  Marzetta Board, MD, PhD 07/05/2019, 11:54 AM

## 2019-07-06 LAB — SARS CORONAVIRUS 2 (TAT 6-24 HRS): SARS Coronavirus 2: NEGATIVE

## 2019-07-06 NOTE — Progress Notes (Signed)
As with yesterday, patient refused standing weight. Bed weight obtained instead. Educated patient on importance of consistent, accurate weights and imprecise nature of bed weights. Patient still declined standing weight.

## 2019-07-06 NOTE — Social Work (Signed)
CSW followed up with [facility], insurance authorization is still pending. CSW will continue to follow-up.  Criss Alvine, [place] Social Worker

## 2019-07-06 NOTE — Progress Notes (Signed)
PROGRESS NOTE  [name] Bell MWN:[id number] DOB: 1945-08-15 DOA: 07/03/2019 PCP: Hendricks Limes, MD   LOS: 2 days   Brief Narrative / Interim history: 74 year old female with HTN, HLD, chronic respiratory failure on 2 L at home, legally blind, ischemic colitis, history of PUD, small bowel perforation status post resection with residual colostomy, chronic pain came into the hospital with worsening shortness of breath over the last 3 to 4 days.  She also reports having a productive cough.  She also noted left-sided chest pressure and pain along the left flank when trying to take a deep breath.  Of note, her Lasix was recently changed to as needed due to hypotension.  Her metoprolol has been increased from 12.5-25 just a few days ago for occasional sinus tach.  It also looks like in April 2021 had difficulties breathing and a chest x-ray at that time showed left-sided pleural effusion, along with concern for left lower lobe infiltrate.  Subjective / 24h Interval events: Feeling well, no shortness of breath, no chest pain.  Assessment & Plan: Principal Problem Acute on chronic hypoxic respiratory failure, left-sided pleural effusion -this is likely multifactorial, concern for recurrent pneumonia given slight infiltrate in the left lung base, possibly contributing acute on chronic diastolic CHF.  Patient received IV Lasix without much improvement in her respiratory status and eventually underwent a thoracentesis on 6/8 with 1 L of fluid removal.  This is possibly a parapneumonic effusion.  After thoracentesis respiratory status has improved significantly and she has returned to baseline. Keep on antibiotics, will change to augmentin.  Active Problems Acute on chronic diastolic CHF-possibly causing the left-sided pleural effusion also, she underwent a repeat 2D echo which showed normal EF and grade 2 diastolic dysfunction.  Would recommend patient does get daily Lasix/daily weights, if the blood  pressure allows. COPD, acute on chronic hypoxic respiratory failure-no wheezing, COPD seems to be controlled right now t. Suspected lobar pneumonia-patient has a history of left-sided pneumonia couple of months ago.  She was transitioned to doxycycline to complete 5 additional days as an outpatient for a total of 7-day course. Sinus tachycardia-continue home metoprolol Chronic painwith long-term opiate use-continue home regimen.  Consider alternatives to pain regiment since narcotics can drop the blood pressure Iron deficiency anemia-hemoglobin overall stable History of ischemic colitis, status post colostomy-overall stable Hyperlipidemia-continue statin   Scheduled Meds: . docusate sodium  100 mg Oral BID  . DULoxetine  60 mg Oral Daily  . enoxaparin (LOVENOX) injection  30 mg Subcutaneous Q24H  . furosemide  20 mg Oral Daily  . metoprolol tartrate  25 mg Oral BID  . mirtazapine  15 mg Oral QHS  . pravastatin  40 mg Oral Daily  . sodium chloride flush  3 mL Intravenous Q12H   Continuous Infusions: . ceFEPime (MAXIPIME) IV 2 g (07/06/19 0506)   PRN Meds:.acetaminophen **OR** acetaminophen, albuterol, cyclobenzaprine, HYDROcodone-acetaminophen, lidocaine (PF), morphine, ondansetron **OR** ondansetron (ZOFRAN) IV  DVT prophylaxis: Lovenox Code Status: Full code Family Communication: no family at bedside   Status is: Observation  The patient will require care spanning > 2 midnights and should be moved to inpatient because: awaiting insurance authorization  Dispo: The patient is from: SNF              Anticipated d/c is to: SNF              Anticipated d/c date is: 1 day  Patient currently is medically stable to d/c.  Consultants:  None   Procedures:  2D echo  Microbiology  None   Antimicrobials: Cefepime 6/7 >> 6/10 Augmentin 6/10>>   Objective: Vitals:   07/05/19 2100 07/06/19 0447 07/06/19 1002 07/06/19 1114  BP: (!) 104/44 (!) 103/40 (!) 91/38 (!)  92/39  Pulse: 88 80 84 87  Resp: 17 17 16 15   Temp: 98 F (36.7 C) 98.4 F (36.9 C)  98.5 F (36.9 C)  TempSrc: Oral Oral  Oral  SpO2: 100% 100% 100% 100%  Weight:  45.8 kg    Height:        Intake/Output Summary (Last 24 hours) at 07/06/2019 1646 Last data filed at 07/06/2019 1400 Gross per 24 hour  Intake 363 ml  Output 1800 ml  Net -1437 ml   Filed Weights   07/04/19 0339 07/05/19 0337 07/06/19 0447  Weight: 47.9 kg 47.1 kg 45.8 kg    Examination:  Constitutional: No distress Eyes: No scleral icterus ENMT: Moist mucous membranes Neck: normal, supple Respiratory: No wheezing, slightly diminished at the bases, no crackles Cardiovascular: Regular rate and rhythm, no murmurs, trace edema Abdomen: Soft, nontender, nondistended, bowel sounds positive Musculoskeletal: no clubbing / cyanosis.  Skin: No rashes seen Neurologic: No focal deficits  Data Reviewed: I have independently reviewed following labs and imaging studies   CBC: Recent Labs  Lab 07/03/19 1325 07/03/19 1423 07/04/19 0639 07/05/19 0447  WBC 8.3  --  6.6 6.0  NEUTROABS 6.4  --   --   --   HGB 11.8* 11.9* 10.7* 10.2*  HCT 40.0 35.0* 34.4* 32.9*  MCV 93.2  --  89.1 89.4  PLT 387  --  457* 585*   Basic Metabolic Panel: Recent Labs  Lab 07/03/19 1325 07/03/19 1423 07/04/19 0639 07/05/19 0447  NA 135 136 140 140  K 3.6 3.9 3.6 3.8  CL 93*  --  99 99  CO2 29  --  31 32  GLUCOSE 109*  --  102* 111*  BUN 7*  --  7* 5*  CREATININE 0.37*  --  0.37* 0.37*  CALCIUM 9.3  --  9.2 9.1   Liver Function Tests: Recent Labs  Lab 07/05/19 0447  AST 17  ALT 18  ALKPHOS 319*  BILITOT 0.5  PROT 5.2*  ALBUMIN 2.1*   Coagulation Profile: Recent Labs  Lab 07/03/19 1624  INR 1.0   HbA1C: No results for input(s): HGBA1C in the last 72 hours. CBG: No results for input(s): GLUCAP in the last 168 hours.  Recent Results (from the past 240 hour(s))  SARS Coronavirus 2 by RT PCR (hospital order,  performed in [facility] hospital lab) Nasopharyngeal Nasopharyngeal Swab     Status: None   Collection Time: 07/03/19  3:07 PM   Specimen: Nasopharyngeal Swab  Result Value Ref Range Status   SARS Coronavirus 2 NEGATIVE NEGATIVE Final    Comment: (NOTE) SARS-CoV-2 target nucleic acids are NOT DETECTED. The SARS-CoV-2 RNA is generally detectable in upper and lower respiratory specimens during the acute phase of infection. The lowest concentration of SARS-CoV-2 viral copies this assay can detect is 250 copies / mL. A negative result does not preclude SARS-CoV-2 infection and should not be used as the sole basis for treatment or other patient management decisions.  A negative result may occur with improper specimen collection / handling, submission of specimen other than nasopharyngeal swab, presence of viral mutation(s) within the areas targeted by this assay, and inadequate number  of viral copies (<250 copies / mL). A negative result must be combined with clinical observations, patient history, and epidemiological information. Fact Sheet for Patients:   StrictlyIdeas.no Fact Sheet for Healthcare Providers: BankingDealers.co.za This test is not yet approved or cleared  by the [place] FDA and has been authorized for detection and/or diagnosis of SARS-CoV-2 by FDA under an Emergency Use Authorization (EUA).  This EUA will remain in effect (meaning this test can be used) for the duration of the COVID-19 declaration under Section 564(b)(1) of the Act, 21 U.S.C. section 360bbb-3(b)(1), unless the authorization is terminated or revoked sooner. Performed at [facility], [place] [address], [place], [place] [address]   Blood Culture (routine x 2)     Status: None (Preliminary result)   Collection Time: 07/03/19  4:24 PM   Specimen: BLOOD RIGHT ARM  Result Value Ref Range Status   Specimen Description BLOOD RIGHT ARM  Final   Special  Requests   Final    BOTTLES DRAWN AEROBIC ONLY Blood Culture results may not be optimal due to an inadequate volume of blood received in culture bottles   Culture   Final    NO GROWTH 3 DAYS Performed at [facility], [place] [address] [place], [place] [address]    Report Status PENDING  Incomplete  Blood Culture (routine x 2)     Status: None (Preliminary result)   Collection Time: 07/03/19  4:24 PM   Specimen: BLOOD  Result Value Ref Range Status   Specimen Description BLOOD SITE NOT SPECIFIED  Final   Special Requests   Final    BOTTLES DRAWN AEROBIC AND ANAEROBIC Blood Culture adequate volume   Culture   Final    NO GROWTH 3 DAYS Performed at [facility], [address], [place], [place] [address]    Report Status PENDING  Incomplete  Gram stain     Status: None   Collection Time: 07/04/19 10:52 AM   Specimen: Lung, Left; Pleural Fluid  Result Value Ref Range Status   Specimen Description PLEURAL  Final   Special Requests ABDOMEN  Final   Gram Stain   Final    WBC PRESENT,BOTH PMN AND MONONUCLEAR NO ORGANISMS SEEN CYTOSPIN SMEAR Performed at [facility], [address], [place], [place] [address]    Report Status 07/04/2019 FINAL  Final  Culture, body fluid-bottle     Status: None (Preliminary result)   Collection Time: 07/04/19 10:52 AM   Specimen: Peritoneal Washings  Result Value Ref Range Status   Specimen Description PERITONEAL FLUID  Final   Special Requests ABDOMEN  Final   Culture   Final    NO GROWTH 2 DAYS Performed at [place] [address], [place], [place] [address]    Report Status PENDING  Incomplete  SARS CORONAVIRUS 2 (TAT 6-24 HRS) Nasopharyngeal Nasopharyngeal Swab     Status: None   Collection Time: 07/06/19  5:05 AM   Specimen: Nasopharyngeal Swab  Result Value Ref Range Status   SARS Coronavirus 2 NEGATIVE NEGATIVE Final    Comment: (NOTE) SARS-CoV-2 target nucleic acids are NOT DETECTED.  The SARS-CoV-2 RNA is  generally detectable in upper and lower respiratory specimens during the acute phase of infection. Negative results do not preclude SARS-CoV-2 infection, do not rule out co-infections with other pathogens, and should not be used as the sole basis for treatment or other patient management decisions. Negative results must be combined with clinical  observations, patient history, and epidemiological information. The expected result is Negative.  Fact Sheet for Patients: SugarRoll.be  Fact Sheet for Healthcare Providers: https://www.woods-mathews.com/  This test is not yet approved or cleared by the [place] FDA and  has been authorized for detection and/or diagnosis of SARS-CoV-2 by FDA under an Emergency Use Authorization (EUA). This EUA will remain  in effect (meaning this test can be used) for the duration of the COVID-19 declaration under Se ction 564(b)(1) of the Act, 21 U.S.C. section 360bbb-3(b)(1), unless the authorization is terminated or revoked sooner.  Performed at [facility], [place] [address], [place],  [address]      Radiology Studies: No results found.  Marzetta Board, MD, PhD [facility]  Between 7 am - 7 pm I am available, please contact me via Amion or Securechat  Between 7 pm - 7 am I am not available, please contact night coverage MD/APP via Amion

## 2019-07-06 NOTE — Progress Notes (Signed)
Pharmacy Antibiotic Note  [name] Bell is a 74 y.o. female admitted on 07/03/2019 with pneumonia. Pharmacy has been consulted for cefepime dosing.   WBC wnl, afebrile, improving. Was ready for discharge yesterday but delayed due to insurance auth. Plan to transition to doxycyline on discharge.  Plan: Cefepime 2g IV Q12H F/u renal fxn, C&S, clinical status  Height: 5\' 4"  (162.6 cm) Weight: 45.8 kg (100 lb 15.5 oz) IBW/kg (Calculated) : 54.7  Temp (24hrs), Avg:98.2 F (36.8 C), Min:98 F (36.7 C), Max:98.4 F (36.9 C)  Recent Labs  Lab 07/03/19 1325 07/03/19 1624 07/04/19 0639 07/05/19 0447  WBC 8.3  --  6.6 6.0  CREATININE 0.37*  --  0.37* 0.37*  LATICACIDVEN  --  1.5  --   --     Estimated Creatinine Clearance: 43.9 mL/min (A) (by C-G formula based on SCr of 0.37 mg/dL (L)).    Allergies  Allergen Reactions  . Strawberry (Diagnostic) Rash  . Duloxetine Nausea And Vomiting  . Ibuprofen Other (See Comments)    REACTION: Bleeding ulcers  . Nsaids Other (See Comments)    REACTION: Bleeding Ulcer  . Latex Itching    Antimicrobials this admission: Cefepime 6/7>> Azithro x 1 6/7   Microbiology results: 6/8 pleura fluid ngtd 6/7 Bcx ngtd   Thank you for allowing pharmacy to be a part of this patient's care.  Benetta Spar, PharmD, BCPS, BCCP Clinical Pharmacist  Please check AMION for all [facility] phone numbers After 10:00 PM, call [place] [phone]

## 2019-07-07 DIAGNOSIS — R0602 Shortness of breath: Secondary | ICD-10-CM | POA: Diagnosis not present

## 2019-07-07 DIAGNOSIS — J449 Chronic obstructive pulmonary disease, unspecified: Secondary | ICD-10-CM | POA: Diagnosis not present

## 2019-07-07 DIAGNOSIS — K559 Vascular disorder of intestine, unspecified: Secondary | ICD-10-CM | POA: Diagnosis not present

## 2019-07-07 DIAGNOSIS — M6281 Muscle weakness (generalized): Secondary | ICD-10-CM | POA: Diagnosis not present

## 2019-07-07 DIAGNOSIS — I959 Hypotension, unspecified: Secondary | ICD-10-CM | POA: Diagnosis not present

## 2019-07-07 DIAGNOSIS — I5033 Acute on chronic diastolic (congestive) heart failure: Secondary | ICD-10-CM | POA: Diagnosis not present

## 2019-07-07 DIAGNOSIS — Z433 Encounter for attention to colostomy: Secondary | ICD-10-CM | POA: Diagnosis not present

## 2019-07-07 DIAGNOSIS — R600 Localized edema: Secondary | ICD-10-CM | POA: Diagnosis not present

## 2019-07-07 DIAGNOSIS — M2550 Pain in unspecified joint: Secondary | ICD-10-CM | POA: Diagnosis not present

## 2019-07-07 DIAGNOSIS — I5032 Chronic diastolic (congestive) heart failure: Secondary | ICD-10-CM | POA: Diagnosis not present

## 2019-07-07 DIAGNOSIS — Z7401 Bed confinement status: Secondary | ICD-10-CM | POA: Diagnosis not present

## 2019-07-07 DIAGNOSIS — R2689 Other abnormalities of gait and mobility: Secondary | ICD-10-CM | POA: Diagnosis not present

## 2019-07-07 DIAGNOSIS — R630 Anorexia: Secondary | ICD-10-CM | POA: Diagnosis not present

## 2019-07-07 DIAGNOSIS — J90 Pleural effusion, not elsewhere classified: Secondary | ICD-10-CM | POA: Diagnosis not present

## 2019-07-07 DIAGNOSIS — E43 Unspecified severe protein-calorie malnutrition: Secondary | ICD-10-CM | POA: Diagnosis not present

## 2019-07-07 DIAGNOSIS — B373 Candidiasis of vulva and vagina: Secondary | ICD-10-CM | POA: Diagnosis not present

## 2019-07-07 DIAGNOSIS — D649 Anemia, unspecified: Secondary | ICD-10-CM | POA: Diagnosis not present

## 2019-07-07 DIAGNOSIS — J9611 Chronic respiratory failure with hypoxia: Secondary | ICD-10-CM | POA: Diagnosis not present

## 2019-07-07 DIAGNOSIS — G894 Chronic pain syndrome: Secondary | ICD-10-CM | POA: Diagnosis not present

## 2019-07-07 DIAGNOSIS — Z7189 Other specified counseling: Secondary | ICD-10-CM | POA: Diagnosis not present

## 2019-07-07 DIAGNOSIS — J9621 Acute and chronic respiratory failure with hypoxia: Secondary | ICD-10-CM | POA: Diagnosis not present

## 2019-07-07 DIAGNOSIS — R1312 Dysphagia, oropharyngeal phase: Secondary | ICD-10-CM | POA: Diagnosis not present

## 2019-07-07 DIAGNOSIS — Z743 Need for continuous supervision: Secondary | ICD-10-CM | POA: Diagnosis not present

## 2019-07-07 DIAGNOSIS — R000 Tachycardia, unspecified: Secondary | ICD-10-CM | POA: Diagnosis not present

## 2019-07-07 DIAGNOSIS — J069 Acute upper respiratory infection, unspecified: Secondary | ICD-10-CM | POA: Diagnosis not present

## 2019-07-07 DIAGNOSIS — J181 Lobar pneumonia, unspecified organism: Secondary | ICD-10-CM | POA: Diagnosis not present

## 2019-07-07 DIAGNOSIS — E785 Hyperlipidemia, unspecified: Secondary | ICD-10-CM | POA: Diagnosis not present

## 2019-07-07 DIAGNOSIS — D509 Iron deficiency anemia, unspecified: Secondary | ICD-10-CM | POA: Diagnosis not present

## 2019-07-07 DIAGNOSIS — R2681 Unsteadiness on feet: Secondary | ICD-10-CM | POA: Diagnosis not present

## 2019-07-07 LAB — CBC
HCT: 36.2 % (ref 36.0–46.0)
Hemoglobin: 10.9 g/dL — ABNORMAL LOW (ref 12.0–15.0)
MCH: 27.3 pg (ref 26.0–34.0)
MCHC: 30.1 g/dL (ref 30.0–36.0)
MCV: 90.5 fL (ref 80.0–100.0)
Platelets: 499 10*3/uL — ABNORMAL HIGH (ref 150–400)
RBC: 4 MIL/uL (ref 3.87–5.11)
RDW: 15.9 % — ABNORMAL HIGH (ref 11.5–15.5)
WBC: 6.2 10*3/uL (ref 4.0–10.5)
nRBC: 0 % (ref 0.0–0.2)

## 2019-07-07 LAB — BASIC METABOLIC PANEL
Anion gap: 7 (ref 5–15)
BUN: 6 mg/dL — ABNORMAL LOW (ref 8–23)
CO2: 35 mmol/L — ABNORMAL HIGH (ref 22–32)
Calcium: 9.1 mg/dL (ref 8.9–10.3)
Chloride: 96 mmol/L — ABNORMAL LOW (ref 98–111)
Creatinine, Ser: 0.38 mg/dL — ABNORMAL LOW (ref 0.44–1.00)
GFR calc Af Amer: >60 mL/min (ref >60)
GFR calc non Af Amer: >60 mL/min (ref >60)
Glucose, Bld: 117 mg/dL — ABNORMAL HIGH (ref 70–99)
Potassium: 3.8 mmol/L (ref 3.5–5.1)
Sodium: 138 mmol/L (ref 135–145)

## 2019-07-07 MED ORDER — DOXYCYCLINE HYCLATE 100 MG PO TABS: 100.0000 mg | ORAL_TABLET | Freq: Two times a day (BID) | ORAL | Status: DC

## 2019-07-07 NOTE — Discharge Summary (Signed)
Physician Discharge Summary  [name]:[id number] DOB: 1946-01-27 DOA: 07/03/2019  PCP: Hendricks Limes, MD  Admit date: 07/03/2019 Discharge date: 07/07/2019  Admitted From: SNF Disposition:  SNF  Recommendations for Outpatient Follow-up:  1. Follow up with PCP in 1-2 weeks 2. Please continue daily Lasix, monitor blood pressure closely, daily weights if able 3. PCP follow-up cytology on pleural effusion  Home Health: None Equipment/Devices: None  Discharge Condition: Stable CODE STATUS: Full code Diet recommendation: Low-sodium diet  HPI: Per admitting MD, [name] Bell is a 74 y.o. female with medical history significant of hypertension, hyperlipidemia, chronic respiratory failure on 2 L of nasal cannula oxygen, legally blind, ischemic colitis, duodenal ulcer, small bowel perforation s/p resection with residual colostomy, and chronic pain who presented with complaint of intermittent shortness of breath over the last 3 to 4 days.  Breathing treatments seem to help intermittently.  She reports having a productive cough.  Noted associated symptoms of left-sided chest pressure and pain along the left flank when trying to take deep inspiratory breath.  Denies any previous history of blood clots or calf pain.  She is not on any anticoagulants.  Review of records notes that patient's Lasix had recently been changed to as needed due to hypotension and her metoprolol was increased from 12.5 mg twice daily to 25 mg twice daily on 6/4.  Hospital Course / Discharge diagnoses: Principal Problem Acute on chronic hypoxic respiratory failure, left-sided pleural effusion -this is likely multifactorial, concern for recurrent pneumonia given slight infiltrate in the left lung base, possibly contributing acute on chronic diastolic CHF.  Patient received IV Lasix without much improvement in her respiratory status and eventually underwent a thoracentesis on 6/8 with 1 L of fluid removal.  This is  possibly a parapneumonic effusion.  After thoracentesis respiratory status has improved significantly and she has returned to baseline.  Would recommend to continue antibiotics to cover for possible pneumonia for 5 additional days.  Of note, cytology has been sent to the effusion and showed reactive mesothelial cells  Active Problems Acute on chronic diastolic CHF-possibly causing the left-sided pleural effusion also, she underwent a repeat 2D echo which showed normal EF and grade 2 diastolic dysfunction.  Would recommend patient does get daily Lasix/daily weights, if the blood pressure allows. COPD, acute on chronic hypoxic respiratory failure-no wheezing, COPD seems to be controlled right now t. Suspected lobar pneumonia-patient has a history of left-sided pneumonia couple of months ago.  She was transitioned to doxycycline to complete 3 additional days as an outpatient for a total of 7-day course. Sinus tachycardia-continue home metoprolol Chronic pain with long-term opiate use-continue home regimen.  Consider alternatives to pain regiment since narcotics can drop the blood pressure Iron deficiency anemia-hemoglobin overall stable History of ischemic colitis, status post colostomy-overall stable Hyperlipidemia-continue statin  Discharge Instructions   Allergies as of 07/07/2019      Reactions   Strawberry (diagnostic) Rash   Duloxetine Nausea And Vomiting   Ibuprofen Other (See Comments)   REACTION: Bleeding ulcers   Nsaids Other (See Comments)   REACTION: Bleeding Ulcer   Latex Itching      Medication List    TAKE these medications   albuterol (2.5 MG/3ML) 0.083% nebulizer solution Commonly known as: PROVENTIL Take 3 mLs (2.5 mg total) by nebulization every 6 (six) hours as needed for wheezing or shortness of breath. What changed: Another medication with the same name was changed. Make sure you understand how and when to  take each.   albuterol 108 (90 Base) MCG/ACT  inhaler Commonly known as: VENTOLIN HFA TAKE 2 PUFFS BY MOUTH EVERY 6 HOURS AS NEEDED FOR WHEEZE OR SHORTNESS OF BREATH What changed: See the new instructions.   cyclobenzaprine 5 MG tablet Commonly known as: FLEXERIL Take 1 tablet (5 mg total) by mouth 2 (two) times daily as needed for muscle spasms.   docusate sodium 100 MG capsule Commonly known as: Colace Take 1 capsule (100 mg total) by mouth 2 (two) times daily. To prevent constipation while taking pain medication.   doxycycline 100 MG capsule Commonly known as: VIBRAMYCIN Take 1 capsule (100 mg total) by mouth 2 (two) times daily for 5 days. Notes to patient: **NEW** To treat lung infection. Take twice daily until all gone   DULoxetine 60 MG capsule Commonly known as: CYMBALTA Take 60 mg by mouth daily.   feeding supplement Liqd Commonly known as: BOOST / RESOURCE BREEZE Take 1 Container by mouth 2 (two) times daily.   furosemide 20 MG tablet Commonly known as: LASIX Take 1 tablet (20 mg total) by mouth daily. What changed:   when to take this  reasons to take this Notes to patient: **INCREASED** To remove fluid. Take once every day    HYDROcodone-acetaminophen 7.5-325 MG tablet Commonly known as: Norco Take 1 tablet by mouth every 6 (six) hours as needed for moderate pain or severe pain.   metoprolol tartrate 25 MG tablet Commonly known as: LOPRESSOR TAKE 1/2 TABLET TWICE A DAY What changed: how much to take   mirtazapine 15 MG tablet Commonly known as: REMERON Take 15 mg by mouth at bedtime.   morphine 15 MG 12 hr tablet Commonly known as: MS CONTIN Take 1 tablet (15 mg total) by mouth every 12 (twelve) hours as needed for pain.   multivitamin with minerals Tabs tablet Take 1 tablet by mouth daily.   ondansetron 4 MG tablet Commonly known as: ZOFRAN TAKE 1 TABLET BY MOUTH EVERY 8 HOURS AS NEEDED FOR NAUSEA AND VOMITING What changed: See the new instructions.   potassium chloride 10 MEQ  tablet Commonly known as: KLOR-CON Take 20 mEq by mouth daily.   pravastatin 40 MG tablet Commonly known as: PRAVACHOL TAKE 1 TABLET BY MOUTH EVERY DAY       Follow-up Information    Hendricks Limes, MD. Schedule an appointment as soon as possible for a visit in 1 week(s).   Specialty: Internal Medicine Contact information: [place] [address] [phone]              Consultations:  None   Procedures/Studies:  DG Chest 1 View  Result Date: 07/04/2019 CLINICAL DATA:  Status post left pleural effusion. EXAM: CHEST  1 VIEW COMPARISON:  07/03/2019 FINDINGS: Significant reduction in left pleural fluid volume with small effusion remaining after thoracentesis. No pneumothorax. Stable chronic lung disease. Bibasilar scarring/atelectasis. IMPRESSION: Significant reduction in left pleural fluid volume after thoracentesis. No pneumothorax. Electronically Signed   By: Aletta Edouard M.D.   On: 07/04/2019 10:49   CT ANGIO CHEST PE W OR WO CONTRAST  Result Date: 07/03/2019 CLINICAL DATA:  Shortness of breath, hypoxic, elevated D dimer EXAM: CT ANGIOGRAPHY CHEST WITH CONTRAST TECHNIQUE: Multidetector CT imaging of the chest was performed using the standard protocol during bolus administration of intravenous contrast. Multiplanar CT image reconstructions and MIPs were obtained to evaluate the vascular anatomy. CONTRAST:  66mL OMNIPAQUE IOHEXOL 350 MG/ML SOLN COMPARISON:  07/03/2019 FINDINGS: Cardiovascular: This  is a technically adequate evaluation of the pulmonary vasculature. There are no filling defects or acute pulmonary emboli. 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 The heart is not enlarged. There is a small pericardial effusion. Normal caliber of the thoracic aorta. Extensive atherosclerosis of the aorta and coronary vasculature. Mediastinum/Nodes: No enlarged mediastinal,  hilar, or axillary lymph nodes. Thyroid gland, trachea, and esophagus demonstrate no significant findings. Lungs/Pleura: There is a large left pleural effusion volume estimated approximately 2 L. There is extensive background emphysema. There is complete compressive atelectasis of the left lower lobe. Right lower lobe atrophy is noted as described above, with volume loss and right lower lobe bronchial wall thickening. Upper Abdomen: No acute abnormality. Musculoskeletal: No acute or destructive bony lesions. Chronic T5, T6, T8, and T12 compression deformities are noted. Reconstructed images confirm the above findings. Review of the MIP images confirms the above findings. IMPRESSION: 1. No acute pulmonary embolus. 2. Large left pleural effusion volume estimated 2 L. Complete compressive atelectasis of the left lower lobe. 3. Aortic Atherosclerosis (ICD10-I70.0) and Emphysema (ICD10-J43.9). 4. Trace pericardial effusion. Electronically Signed   By: Randa Ngo M.D.   On: 07/03/2019 19:37   DG Chest Port 1 View  Result Date: 07/03/2019 CLINICAL DATA:  Short of breath and left flank pain with deep inspiration. EXAM: PORTABLE CHEST 1 VIEW COMPARISON:  04/21/2019 and older studies. FINDINGS: There is opacity at the left lung base obscuring the hemidiaphragm, with additional hazy opacity extending throughout much of the left hemithorax. Mild hazy opacity is noted at the right lung base. Lungs also demonstrate prominent bronchovascular markings. Cardiac silhouette is normal in size. No mediastinal or hilar masses. No pneumothorax. Skeletal structures are demineralized. Vertebral compression deformities, not well visualized on this AP study. IMPRESSION: 1. Stable appearance of the chest radiograph since the most recent prior study. Findings consistent with moderate left and small right pleural effusions with associated atelectasis. Can not exclude a component of pneumonia. No convincing pulmonary edema. Electronically  Signed   By: Lajean Manes M.D.   On: 07/03/2019 14:32   ECHOCARDIOGRAM COMPLETE  Result Date: 07/04/2019    ECHOCARDIOGRAM REPORT   Patient Name:   Gagandeep MARGRETT KALB Date of Exam: 07/04/2019 Medical Rec #:  [id number]      Height:       64.0 in Accession #:    [id number]     Weight:       105.6 lb Date of Birth:  1945/04/04      BSA:          1.491 m Patient Age:    53 years       BP:           113/44 mmHg Patient Gender: F              HR:           97 bpm. Exam Location:  Inpatient Procedure: 2D Echo Indications:    Acute Respiratory Insufficiency 518.82 / R06.89  History:        Patient has prior history of Echocardiogram examinations, most                 recent 04/02/2016. COPD, Signs/Symptoms:Dyspnea; Risk                 Factors:Dyslipidemia and Former Smoker. Pleural effusion.  Sonographer:    Leavy Cella Referring Phys: [place]  IMPRESSIONS  1. Left ventricular ejection fraction, by estimation, is 65 to 70%. The left ventricle has normal function. The left ventricle has no regional wall motion abnormalities. Left ventricular diastolic parameters are consistent with Grade II diastolic dysfunction (pseudonormalization). Elevated left atrial pressure.  2. Right ventricular systolic function is normal. The right ventricular size is mildly enlarged. There is mildly elevated pulmonary artery systolic pressure. The estimated right ventricular systolic pressure is 81.1 mmHg.  3. The mitral valve is normal in structure. No evidence of mitral valve regurgitation.  4. The aortic valve was not well visualized. Aortic valve regurgitation is not visualized. No aortic stenosis is present.  5. The inferior vena cava is normal in size with greater than 50% respiratory variability, suggesting right atrial pressure of 3 mmHg. FINDINGS  Left Ventricle: Left ventricular ejection fraction, by estimation, is 65 to 70%. The left ventricle has normal function. The left ventricle has no regional wall motion  abnormalities. The left ventricular internal cavity size was small. There is no left ventricular hypertrophy. Left ventricular diastolic parameters are consistent with Grade II diastolic dysfunction (pseudonormalization). Elevated left atrial pressure. Right Ventricle: The right ventricular size is mildly enlarged. No increase in right ventricular wall thickness. Right ventricular systolic function is normal. There is mildly elevated pulmonary artery systolic pressure. The tricuspid regurgitant velocity is 3.06 m/s, and with an assumed right atrial pressure of 3 mmHg, the estimated right ventricular systolic pressure is 91.4 mmHg. Left Atrium: Left atrial size was normal in size. Right Atrium: Right atrial size was normal in size. Pericardium: There is no evidence of pericardial effusion. Presence of pericardial fat pad. Mitral Valve: The mitral valve is normal in structure. No evidence of mitral valve regurgitation. Tricuspid Valve: The tricuspid valve is normal in structure. Tricuspid valve regurgitation is trivial. Aortic Valve: The aortic valve was not well visualized. Aortic valve regurgitation is not visualized. No aortic stenosis is present. Pulmonic Valve: The pulmonic valve was not well visualized. Pulmonic valve regurgitation is not visualized. Aorta: The aortic root is normal in size and structure. Venous: The inferior vena cava is normal in size with greater than 50% respiratory variability, suggesting right atrial pressure of 3 mmHg. IAS/Shunts: The interatrial septum was not well visualized.  LEFT VENTRICLE PLAX 2D LVIDd:         3.13 cm Diastology LVIDs:         1.86 cm LV e' lateral:   5.98 cm/s LV PW:         1.03 cm LV E/e' lateral: 19.1 LV IVS:        0.52 cm LV e' medial:    5.55 cm/s                        LV E/e' medial:  20.5  RIGHT VENTRICLE RV S prime:     11.70 cm/s TAPSE (M-mode): 1.2 cm LEFT ATRIUM             Index       RIGHT ATRIUM          Index LA diam:        2.30 cm 1.54 cm/m  RA  Area:     7.96 cm LA Vol (A2C):   54.7 ml 36.68 ml/m RA Volume:   14.60 ml 9.79 ml/m LA Vol (A4C):   36.5 ml 24.47 ml/m LA Biplane Vol: 45.4 ml 30.44 ml/m   AORTA Ao Root diam: 2.80 cm MITRAL VALVE  TRICUSPID VALVE MV Area (PHT): 2.76 cm     TR Peak grad:   37.5 mmHg MV Decel Time: 275 msec     TR Vmax:        306.00 cm/s MV E velocity: 114.00 cm/s MV A velocity: 96.10 cm/s MV E/A ratio:  1.19 Oswaldo Milian MD Electronically signed by Oswaldo Milian MD Signature Date/Time: 07/04/2019/3:35:59 PM    Final    IR THORACENTESIS ASP PLEURAL SPACE W/IMG GUIDE  Result Date: 07/04/2019 INDICATION: Patient with history of pleurisy, COPD on 2 L of oxygen continuous at home presented to the ED yesterday with worsening dyspnea. Imaging shows large left pleural effusion. Request to IR for diagnostic and therapeutic thoracentesis. EXAM: ULTRASOUND GUIDED LEFT THORACENTESIS MEDICATIONS: 8 mL 1% lidocaine COMPLICATIONS: None immediate. PROCEDURE: An ultrasound guided thoracentesis was thoroughly discussed with the patient and questions answered. The benefits, risks, alternatives and complications were also discussed. The patient understands and wishes to proceed with the procedure. Written consent was obtained. Ultrasound was performed to localize and mark an adequate pocket of fluid in the left chest. The area was then prepped and draped in the normal sterile fashion. 1% Lidocaine was used for local anesthesia. Under ultrasound guidance a 6 Fr Safe-T-Centesis catheter was introduced. Thoracentesis was performed. The catheter was removed and a dressing applied. FINDINGS: A total of approximately 1.0 L of clear yellow fluid was removed. Samples were sent to the laboratory as requested by the clinical team. IMPRESSION: Successful ultrasound guided left thoracentesis yielding 1.0 L of pleural fluid. Read by Candiss Norse, PA-C Electronically Signed   By: Aletta Edouard M.D.   On: 07/04/2019 10:23     Subjective: - no chest pain, shortness of breath, no abdominal pain, nausea or vomiting.   Discharge Exam: BP (!) 113/47   Pulse 85   Temp 98.1 F (36.7 C) (Oral)   Resp 19   Ht 5\' 4"  (1.626 m)   Wt 42.8 kg Comment: scale b  LMP 08/25/1980   SpO2 98%   BMI 16.20 kg/m   General: Pt is alert, awake, not in acute distress Cardiovascular: RRR, S1/S2 +, no rubs, no gallops Respiratory: CTA bilaterally, no wheezing, no rhonchi  The results of significant diagnostics from this hospitalization (including imaging, microbiology, ancillary and laboratory) are listed below for reference.     Microbiology: Recent Results (from the past 240 hour(s))  SARS Coronavirus 2 by RT PCR (hospital order, performed in [facility] hospital lab) Nasopharyngeal Nasopharyngeal Swab     Status: None   Collection Time: 07/03/19  3:07 PM   Specimen: Nasopharyngeal Swab  Result Value Ref Range Status   SARS Coronavirus 2 NEGATIVE NEGATIVE Final    Comment: (NOTE) SARS-CoV-2 target nucleic acids are NOT DETECTED. The SARS-CoV-2 RNA is generally detectable in upper and lower respiratory specimens during the acute phase of infection. The lowest concentration of SARS-CoV-2 viral copies this assay can detect is 250 copies / mL. A negative result does not preclude SARS-CoV-2 infection and should not be used as the sole basis for treatment or other patient management decisions.  A negative result may occur with improper specimen collection / handling, submission of specimen other than nasopharyngeal swab, presence of viral mutation(s) within the areas targeted by this assay, and inadequate number of viral copies (<250 copies / mL). A negative result must be combined with clinical observations, patient history, and epidemiological information. Fact Sheet for Patients:   StrictlyIdeas.no Fact Sheet for Healthcare Providers: BankingDealers.co.za This test is  not yet approved or cleared  by the [place] and has been authorized for detection and/or diagnosis of SARS-CoV-2 by FDA under an Emergency Use Authorization (EUA).  This EUA will remain in effect (meaning this test can be used) for the duration of the COVID-19 declaration under Section 564(b)(1) of the Act, 21 U.S.C. section 360bbb-3(b)(1), unless the authorization is terminated or revoked sooner. Performed at [facility], [place] [address] [place], [place] [address]   Blood Culture (routine x 2)     Status: None (Preliminary result)   Collection Time: 07/03/19  4:24 PM   Specimen: BLOOD RIGHT ARM  Result Value Ref Range Status   Specimen Description BLOOD RIGHT ARM  Final   Special Requests   Final    BOTTLES DRAWN AEROBIC ONLY Blood Culture results may not be optimal due to an inadequate volume of blood received in culture bottles   Culture   Final    NO GROWTH 4 DAYS Performed at [facility], [place] [address], [place], [place] [address]    Report Status PENDING  Incomplete  Blood Culture (routine x 2)     Status: None (Preliminary result)   Collection Time: 07/03/19  4:24 PM   Specimen: BLOOD  Result Value Ref Range Status   Specimen Description BLOOD SITE NOT SPECIFIED  Final   Special Requests   Final    BOTTLES DRAWN AEROBIC AND ANAEROBIC Blood Culture adequate volume   Culture   Final    NO GROWTH 4 DAYS Performed at [facility], [address], [place], [place] [address]    Report Status PENDING  Incomplete  Gram stain     Status: None   Collection Time: 07/04/19 10:52 AM   Specimen: Lung, Left; Pleural Fluid  Result Value Ref Range Status   Specimen Description PLEURAL  Final   Special Requests ABDOMEN  Final   Gram Stain   Final    WBC PRESENT,BOTH PMN AND MONONUCLEAR NO ORGANISMS SEEN CYTOSPIN SMEAR Performed at [facility], [address], [place], [place] [address]    Report Status 07/04/2019 FINAL  Final  Culture, body  fluid-bottle     Status: None (Preliminary result)   Collection Time: 07/04/19 10:52 AM   Specimen: Peritoneal Washings  Result Value Ref Range Status   Specimen Description PERITONEAL FLUID  Final   Special Requests ABDOMEN  Final   Culture   Final    NO GROWTH 3 DAYS Performed at [facility] [address], [place], [place] [address]    Report Status PENDING  Incomplete  SARS CORONAVIRUS 2 (TAT 6-24 HRS) Nasopharyngeal Nasopharyngeal Swab     Status: None   Collection Time: 07/06/19  5:05 AM   Specimen: Nasopharyngeal Swab  Result Value Ref Range Status   SARS Coronavirus 2 NEGATIVE NEGATIVE Final    Comment: (NOTE) SARS-CoV-2 target nucleic acids are NOT DETECTED.  The SARS-CoV-2 RNA is generally detectable in upper and lower respiratory specimens during the acute phase of infection. Negative results do not preclude SARS-CoV-2 infection, do not rule out co-infections with other pathogens, and should not be used as the sole basis for treatment or other patient management decisions. Negative results must be combined with clinical observations, patient history, and epidemiological information. The expected result is Negative.  Fact Sheet for Patients: SugarRoll.be  Fact Sheet for Healthcare Providers: https://www.woods-mathews.com/  This test is not yet approved or cleared by the [place] FDA  and  has been authorized for detection and/or diagnosis of SARS-CoV-2 by FDA under an Emergency Use Authorization (EUA). This EUA will remain  in effect (meaning this test can be used) for the duration of the COVID-19 declaration under Se ction 564(b)(1) of the Act, 21 U.S.C. section 360bbb-3(b)(1), unless the authorization is terminated or revoked sooner.  Performed at [facility], [place] [address], [place], [place] [address]      Labs: Basic Metabolic Panel: Recent Labs  Lab 07/03/19 1325 07/03/19 1423 07/04/19 0639  07/05/19 0447 07/07/19 0702  NA 135 136 140 140 138  K 3.6 3.9 3.6 3.8 3.8  CL 93*  --  99 99 96*  CO2 29  --  31 32 35*  GLUCOSE 109*  --  102* 111* 117*  BUN 7*  --  7* 5* 6*  CREATININE 0.37*  --  0.37* 0.37* 0.38*  CALCIUM 9.3  --  9.2 9.1 9.1   Liver Function Tests: Recent Labs  Lab 07/05/19 0447  AST 17  ALT 18  ALKPHOS 319*  BILITOT 0.5  PROT 5.2*  ALBUMIN 2.1*   CBC: Recent Labs  Lab 07/03/19 1325 07/03/19 1423 07/04/19 0639 07/05/19 0447 07/07/19 0702  WBC 8.3  --  6.6 6.0 6.2  NEUTROABS 6.4  --   --   --   --   HGB 11.8* 11.9* 10.7* 10.2* 10.9*  HCT 40.0 35.0* 34.4* 32.9* 36.2  MCV 93.2  --  89.1 89.4 90.5  PLT 387  --  457* 458* 499*   CBG: No results for input(s): GLUCAP in the last 168 hours. Hgb A1c No results for input(s): HGBA1C in the last 72 hours. Lipid Profile No results for input(s): CHOL, HDL, LDLCALC, TRIG, CHOLHDL, LDLDIRECT in the last 72 hours. Thyroid function studies No results for input(s): TSH, T4TOTAL, T3FREE, THYROIDAB in the last 72 hours.  Invalid input(s): FREET3 Urinalysis    Component Value Date/Time   COLORURINE YELLOW 07/03/2019 1624   APPEARANCEUR CLEAR 07/03/2019 1624   LABSPEC 1.010 07/03/2019 1624   PHURINE 6.0 07/03/2019 1624   GLUCOSEU NEGATIVE 07/03/2019 1624   GLUCOSEU NEG mg/dL 02/08/2007 2124   HGBUR NEGATIVE 07/03/2019 1624   BILIRUBINUR NEGATIVE 07/03/2019 1624   KETONESUR NEGATIVE 07/03/2019 1624   PROTEINUR NEGATIVE 07/03/2019 1624   UROBILINOGEN 0.2 10/11/2013 1640   NITRITE NEGATIVE 07/03/2019 1624   LEUKOCYTESUR NEGATIVE 07/03/2019 1624    FURTHER DISCHARGE INSTRUCTIONS:   Get Medicines reviewed and adjusted: Please take all your medications with you for your next visit with your Primary MD   Laboratory/radiological data: Please request your Primary MD to go over all hospital tests and procedure/radiological results at the follow up, please ask your Primary MD to get all Hospital records  sent to his/her office.   In some cases, they will be blood work, cultures and biopsy results pending at the time of your discharge. Please request that your primary care M.D. goes through all the records of your hospital data and follows up on these results.   Also Note the following: If you experience worsening of your admission symptoms, develop shortness of breath, life threatening emergency, suicidal or homicidal thoughts you must seek medical attention immediately by calling 911 or calling your MD immediately  if symptoms less severe.   You must read complete instructions/literature along with all the possible adverse reactions/side effects for all the Medicines you take and that have been prescribed to you. Take any new Medicines after you  have completely understood and accpet all the possible adverse reactions/side effects.    Do not drive when taking Pain medications or sleeping medications (Benzodaizepines)   Do not take more than prescribed Pain, Sleep and Anxiety Medications. It is not advisable to combine anxiety,sleep and pain medications without talking with your primary care practitioner   Special Instructions: If you have smoked or chewed Tobacco  in the last 2 yrs please stop smoking, stop any regular Alcohol  and or any Recreational drug use.   Wear Seat belts while driving.   Please note: You were cared for by a hospitalist during your hospital stay. Once you are discharged, your primary care physician will handle any further medical issues. Please note that NO REFILLS for any discharge medications will be authorized once you are discharged, as it is imperative that you return to your primary care physician (or establish a relationship with a primary care physician if you do not have one) for your post hospital discharge needs so that they can reassess your need for medications and monitor your lab values.  Time coordinating discharge: 10 minutes  SIGNED:  Marzetta Board,  MD, PhD 07/07/2019, 10:35 AM

## 2019-07-07 NOTE — Consult Note (Signed)
[facility] CM Inpatient Consult   07/07/2019  [name] Bell 06/07/1945 [id number]   [facility] [[facility]]  Accountable Care Organization [ACO] Patient:  [employer] Medicare   Patient screened for high risk score for unplanned readmission and to assess for potential [facility]  [[facility]] Care Management service needs.  Review of patient's medical record reveals patient is being recommended for a skilled nursing facility level of care.  Primary Care Provider is  William Hooper, MD this provider is listed to provide the transition of care [TOC] for post hospital follow up.  Plan: no post hospital needs for [facility] Care Management services noted, patient care needs to be met at a skilled nursing facility level of care.  For questions contact:    , RN BSN CCM [facility] Hospital Liaison  [phone] business mobile phone Toll free office [phone]  Fax number: [phone] [email or web address]       

## 2019-07-07 NOTE — Progress Notes (Signed)
Nurse called report to number provided [phone] going to room 319.  Peripheral iv removed and dressing applied.  belongings with pt. Discharge packet given to [facility].

## 2019-07-07 NOTE — TOC Transition Note (Signed)
Transition of Care [facility]) - CM/SW Discharge Note   Patient Details  Name: [name] Bell MRN: [id number] Date of Birth: [birth year]  Transition of Care [facility]) CM/SW Contact:  Jacquelynn Cree Phone Number: 07/07/2019, 12:18 PM   Clinical Narrative:    Patient will DC to: [facility] notified: Peggy Transport by: Corey Harold   Per MD patient ready for DC to [facility]. RN, patient, patient's family, and facility notified of DC. Discharge Summary and FL2 sent to facility. RN to call report prior to discharge [phone] Room 319). DC packet on chart. Ambulance transport requested for patient.   CSW will sign off for now as social work intervention is no longer needed. Please consult [place] again if new needs arise.    Final next level of care: Skilled Nursing Facility Barriers to Discharge: No Barriers Identified   Patient Goals and CMS Choice   CMS Medicare.gov Compare Post Acute Care list provided to:: Patient Choice offered to / list presented to : Patient  Discharge Placement   Existing PASRR number confirmed : 07/07/19          Patient chooses bed at: [facility] Patient to be transferred to facility by: [place] Name of family member notified: Peggy Patient and family notified of of transfer: 07/07/19  Discharge Plan and Services                                     Social Determinants of Health (SDOH) Interventions     Readmission Risk Interventions No flowsheet data found.

## 2019-07-07 NOTE — NC FL2 (Signed)
[place] MEDICAID FL2 LEVEL OF CARE SCREENING TOOL     IDENTIFICATION  Patient Name: [name] Bell Birthdate: [birth year] Sex: female Admission Date (Current Location): 07/03/2019  [facility] and [place] Number:  Herbalist and Address:  [facility], [place] [address], [place], [place] [address]      Provider Number: [id number]  Attending Physician Name and Address:  Caren Griffins, MD  Relative Name and Phone Number:       Current Level of Care: Hospital Recommended Level of Care: [place] Prior Approval Number:    Date Approved/Denied:   PASRR Number: [id number]  Discharge Plan: SNF    Current Diagnoses: Patient Active Problem List   Diagnosis Date Noted  . Dyspnea 07/04/2019  . Pleural effusion 07/03/2019  . Chronic respiratory failure with hypoxia ([place]) 07/03/2019  . Opioid dependence with opioid-induced disorder ([place]) 05/11/2019  . FTT (failure to thrive) in adult 05/06/2019  . Symptomatic anemia 04/19/2019  . Frailty 04/19/2019  . External hemorrhoids without complication 56/21/3086  . Swelling of lower extremity 08/10/2018  . Pain due to onychomycosis of toenails of both feet 08/03/2018  . At risk for adverse drug reaction 03/29/2018  . Malnutrition of moderate degree 03/28/2018  . Duodenal ulcer disease 07/27/2017  . Hepatic steatosis 07/03/2017  . Aortic atherosclerosis ([place]) 06/04/2017  . Iron deficiency anemia 06/04/2017  . History of ischemic colitis 06/03/2017  . S/P sigmoid colectomy 06/03/2017  . Back pain, chronic 08/14/2016  . Hypokalemia 03/31/2016  . Sinus tachycardia 09/11/2015  . Macular degeneration 01/14/2012  . Preventative health care 01/14/2012  . COPD (chronic obstructive pulmonary disease) ([place]) 11/25/2011  . Colostomy present ([place]) 10/27/2011  . Hyperlipidemia 01/24/2009  . Long term (current) use of opiate analgesic 01/03/2009  . Major depression, chronic 03/08/2008  .  Osteoporosis 03/03/2007    Orientation RESPIRATION BLADDER Height & Weight     Self, Time, Situation, Place  Normal Incontinent, External catheter Weight: 94 lb 6.4 oz (42.8 kg) (scale b) Height:  5\' 4"  (162.6 cm)  BEHAVIORAL SYMPTOMS/MOOD NEUROLOGICAL BOWEL NUTRITION STATUS      Continent Diet (See discharge summary)  AMBULATORY STATUS COMMUNICATION OF NEEDS Skin   Limited Assist Verbally Normal                       Personal Care Assistance Level of Assistance  Bathing, Dressing, Feeding Bathing Assistance: Limited assistance Feeding assistance: Independent Dressing Assistance: Limited assistance     Functional Limitations Info  Sight, Speech, Hearing Sight Info: Impaired Hearing Info: Impaired Speech Info: Adequate    SPECIAL CARE FACTORS FREQUENCY  PT (By licensed PT), OT (By licensed OT)     PT Frequency: 5x a week OT Frequency: 5x a week            Contractures Contractures Info: Not present    Additional Factors Info  Allergies, Code Status Code Status Info: Full Allergies Info: Strawberry (Diagnostic), Duloxetine, Ibuprofen, Nsaids, Latex           Current Medications (07/07/2019):  This is the current hospital active medication list Current Facility-Administered Medications  Medication Dose Route Frequency Provider Last Rate Last Admin  . acetaminophen (TYLENOL) tablet 650 mg  650 mg Oral Q6H PRN Fuller Plan A, MD       Or  . acetaminophen (TYLENOL) suppository 650 mg  650 mg Rectal Q6H PRN Norval Morton, MD      . albuterol (PROVENTIL) (  2.5 MG/3ML) 0.083% nebulizer solution 2.5 mg  2.5 mg Nebulization Q6H PRN Tamala Julian, Rondell A, MD      . cyclobenzaprine (FLEXERIL) tablet 5 mg  5 mg Oral BID PRN Fuller Plan A, MD   5 mg at 07/06/19 0752  . docusate sodium (COLACE) capsule 100 mg  100 mg Oral BID Fuller Plan A, MD   100 mg at 07/07/19 0916  . doxycycline (VIBRA-TABS) tablet 100 mg  100 mg Oral BID Marzetta Board M, MD      .  DULoxetine (CYMBALTA) DR capsule 60 mg  60 mg Oral Daily Fuller Plan A, MD   60 mg at 07/07/19 0915  . enoxaparin (LOVENOX) injection 30 mg  30 mg Subcutaneous Q24H Smith, Rondell A, MD   30 mg at 07/06/19 2042  . furosemide (LASIX) tablet 20 mg  20 mg Oral Daily Caren Griffins, MD   20 mg at 07/07/19 0916  . HYDROcodone-acetaminophen (NORCO) 7.5-325 MG per tablet 1 tablet  1 tablet Oral Q6H PRN Norval Morton, MD   1 tablet at 07/07/19 0507  . lidocaine (PF) (XYLOCAINE) 1 % injection   Infiltration PRN Candiss Norse A, PA-C   10 mL at 07/04/19 1022  . metoprolol tartrate (LOPRESSOR) tablet 25 mg  25 mg Oral BID Fuller Plan A, MD   25 mg at 07/07/19 0915  . mirtazapine (REMERON) tablet 15 mg  15 mg Oral QHS Fuller Plan A, MD   15 mg at 07/06/19 2042  . morphine (MS CONTIN) 12 hr tablet 15 mg  15 mg Oral Q12H PRN Fuller Plan A, MD   15 mg at 07/07/19 0948  . ondansetron (ZOFRAN) tablet 4 mg  4 mg Oral Q6H PRN Fuller Plan A, MD       Or  . ondansetron (ZOFRAN) injection 4 mg  4 mg Intravenous Q6H PRN Smith, Rondell A, MD      . pravastatin (PRAVACHOL) tablet 40 mg  40 mg Oral Daily Tamala Julian, Rondell A, MD   40 mg at 07/07/19 0915  . sodium chloride flush (NS) 0.9 % injection 3 mL  3 mL Intravenous Q12H Fuller Plan A, MD   3 mL at 07/07/19 9311     Discharge Medications: Please see discharge summary for a list of discharge medications.  Relevant Imaging Results:  Relevant Lab Results:   Additional Information SSN [id number]  [name] Bell, [place]

## 2019-07-07 NOTE — Care Management Important Message (Signed)
Important Message  Patient Details  Name: FELIZ HERARD MRN: [id number] Date of Birth: [birth year]   Medicare Important Message Given:  Yes     Shelda Altes 07/07/2019, 8:46 AM

## 2019-07-08 LAB — CULTURE, BLOOD (ROUTINE X 2)
Culture: NO GROWTH
Culture: NO GROWTH
Special Requests: ADEQUATE

## 2019-07-09 LAB — CULTURE, BODY FLUID W GRAM STAIN -BOTTLE: Culture: NO GROWTH

## 2019-07-10 ENCOUNTER — Encounter: Payer: Self-pay | Admitting: Adult Health

## 2019-07-10 ENCOUNTER — Non-Acute Institutional Stay (SKILLED_NURSING_FACILITY): Payer: Medicare Other | Admitting: Adult Health

## 2019-07-10 DIAGNOSIS — J9621 Acute and chronic respiratory failure with hypoxia: Secondary | ICD-10-CM | POA: Diagnosis not present

## 2019-07-10 DIAGNOSIS — R000 Tachycardia, unspecified: Secondary | ICD-10-CM | POA: Diagnosis not present

## 2019-07-10 DIAGNOSIS — J90 Pleural effusion, not elsewhere classified: Secondary | ICD-10-CM | POA: Diagnosis not present

## 2019-07-10 DIAGNOSIS — I5033 Acute on chronic diastolic (congestive) heart failure: Secondary | ICD-10-CM

## 2019-07-10 DIAGNOSIS — J181 Lobar pneumonia, unspecified organism: Secondary | ICD-10-CM | POA: Diagnosis not present

## 2019-07-10 DIAGNOSIS — G894 Chronic pain syndrome: Secondary | ICD-10-CM

## 2019-07-10 DIAGNOSIS — R630 Anorexia: Secondary | ICD-10-CM

## 2019-07-10 NOTE — Progress Notes (Signed)
This encounter was created in error - please disregard.
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
## 2019-07-11 ENCOUNTER — Non-Acute Institutional Stay (SKILLED_NURSING_FACILITY): Payer: Medicare Other | Admitting: Internal Medicine

## 2019-07-11 ENCOUNTER — Encounter: Payer: Self-pay | Admitting: Internal Medicine

## 2019-07-11 DIAGNOSIS — I5032 Chronic diastolic (congestive) heart failure: Secondary | ICD-10-CM | POA: Diagnosis not present

## 2019-07-11 DIAGNOSIS — J9611 Chronic respiratory failure with hypoxia: Secondary | ICD-10-CM
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
## 2019-07-11 NOTE — Assessment & Plan Note (Addendum)
Clinically compensated without neck vein distention or significant pedal edema. Continue daily Lasix as blood pressure tolerates.

## 2019-07-11 NOTE — Patient Instructions (Signed)
See assessment and plan under each diagnosis in the problem list and acutely for this visit 

## 2019-07-11 NOTE — Assessment & Plan Note (Addendum)
Excellent O2 sats on present O2 settings. Continue nasal O2 and pulmonary toilet protocol at [facility]. Complete course of oral doxycycline.

## 2019-07-14 ENCOUNTER — Encounter: Payer: Self-pay | Admitting: Adult Health

## 2019-07-14 ENCOUNTER — Non-Acute Institutional Stay (SKILLED_NURSING_FACILITY): Payer: Medicare Other | Admitting: Adult Health

## 2019-07-14 DIAGNOSIS — J181 Lobar pneumonia, unspecified organism: Secondary | ICD-10-CM

## 2019-07-14 DIAGNOSIS — Z7189 Other specified counseling: Secondary | ICD-10-CM | POA: Diagnosis not present

## 2019-07-14 DIAGNOSIS — G894 Chronic pain syndrome: Secondary | ICD-10-CM

## 2019-07-14 DIAGNOSIS — R000 Tachycardia, unspecified: Secondary | ICD-10-CM | POA: Diagnosis not present

## 2019-07-14 DIAGNOSIS — R630 Anorexia: Secondary | ICD-10-CM | POA: Diagnosis not present

## 2019-07-14 DIAGNOSIS — I5032 Chronic diastolic (congestive) heart failure: Secondary | ICD-10-CM
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
## 2019-07-19 ENCOUNTER — Encounter: Payer: Self-pay | Admitting: Adult Health

## 2019-07-19 ENCOUNTER — Non-Acute Institutional Stay (SKILLED_NURSING_FACILITY): Payer: Medicare Other | Admitting: Adult Health

## 2019-07-19 DIAGNOSIS — B373 Candidiasis of vulva and vagina: Secondary | ICD-10-CM | POA: Diagnosis not present

## 2019-07-19 DIAGNOSIS — B3731 Acute candidiasis of vulva and vagina: Secondary | ICD-10-CM
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
## 2019-07-24 ENCOUNTER — Other Ambulatory Visit: Payer: Self-pay

## 2019-07-24 DIAGNOSIS — G894 Chronic pain syndrome: Secondary | ICD-10-CM

## 2019-07-24 MED ORDER — MORPHINE SULFATE ER 15 MG PO TBCR: 15.0000 mg | EXTENDED_RELEASE_TABLET | Freq: Two times a day (BID) | ORAL | 0 refills | Status: DC | PRN

## 2019-07-24 MED ORDER — HYDROCODONE-ACETAMINOPHEN 7.5-325 MG PO TABS: 1.0000 | ORAL_TABLET | Freq: Four times a day (QID) | ORAL | 0 refills | Status: DC | PRN

## 2019-07-25 ENCOUNTER — Other Ambulatory Visit: Payer: Self-pay | Admitting: Adult Health

## 2019-07-25 DIAGNOSIS — G894 Chronic pain syndrome: Secondary | ICD-10-CM

## 2019-07-25 MED ORDER — HYDROCODONE-ACETAMINOPHEN 7.5-325 MG PO TABS: 1.0000 | ORAL_TABLET | Freq: Four times a day (QID) | ORAL | 0 refills | Status: DC | PRN

## 2019-07-26 DIAGNOSIS — J449 Chronic obstructive pulmonary disease, unspecified: Secondary | ICD-10-CM | POA: Diagnosis not present

## 2019-07-26 DIAGNOSIS — R269 Unspecified abnormalities of gait and mobility: Secondary | ICD-10-CM | POA: Diagnosis not present

## 2019-08-02 LAB — CBC AND DIFFERENTIAL
HCT: 36 (ref 36–46)
Hemoglobin: 11.8 — AB (ref 12.0–16.0)
Neutrophils Absolute: 5
Platelets: 265 (ref 150–399)
WBC: 7.5

## 2019-08-02 LAB — CBC: RBC: 4.02 (ref 3.87–5.11)

## 2019-10-03 ENCOUNTER — Encounter: Payer: Self-pay | Admitting: Internal Medicine

## 2019-10-03 ENCOUNTER — Non-Acute Institutional Stay (SKILLED_NURSING_FACILITY): Payer: Medicare Other | Admitting: Internal Medicine

## 2019-10-03 DIAGNOSIS — Z9049 Acquired absence of other specified parts of digestive tract: Secondary | ICD-10-CM | POA: Diagnosis not present

## 2019-10-03 DIAGNOSIS — R627 Adult failure to thrive: Secondary | ICD-10-CM | POA: Diagnosis not present

## 2019-10-03 DIAGNOSIS — I959 Hypotension, unspecified: Secondary | ICD-10-CM | POA: Diagnosis not present

## 2019-10-03 DIAGNOSIS — F1129 Opioid dependence with unspecified opioid-induced disorder: Secondary | ICD-10-CM | POA: Diagnosis not present

## 2019-10-03 NOTE — Assessment & Plan Note (Signed)
She does describe intermittent pain at the site of the hernia; but she does not wish to have any surgical intervention pursued.

## 2019-10-03 NOTE — Assessment & Plan Note (Addendum)
She states that this has been a chronic issue and her blood pressure actually drops rather than elevates with exacerbations of pain. Beta-blocker has been changed to extended release with some improvement in the chronic hypotension.  Remotely plasma cortisol was normal.  TSH is adequately current and therapeutic.  Anemia is stable to improved.

## 2019-10-03 NOTE — Assessment & Plan Note (Signed)
Subjectively she states that she feels much better.  This is reflected in a self-described 20 pound weight gain and increase in strength.

## 2019-10-03 NOTE — Assessment & Plan Note (Signed)
She categorically states she does not want to adjust her chronic pain regimen in any fashion or form.  She continues to have pain at the site of the ostomy hernia as well as her back and neck.

## 2019-10-03 NOTE — Patient Instructions (Signed)
See assessment and plan under each diagnosis in the problem list and acutely for this visit 

## 2019-10-03 NOTE — Progress Notes (Signed)
NURSING HOME LOCATION:  [name] ROOM NUMBER:  [id number]  CODE STATUS:  DNR  PCP:  Hendricks Limes, MD  [place] [address]   This is a nursing facility follow up of chronic medical diagnoses.  Interim medical record and care since last [place] visit was updated with review of diagnostic studies and change in clinical status since last visit were documented.  HPI: She is a permanent resident of facility with medical diagnoses chronic pain syndrome, history of osteomyelitis, blindness due to macular degeneration, history of ischemic colitis, history of duodenal ulcer, and chronic depression. She has history of bowel resection in the 1970s.  She also had a colectomy performed in 2013.  She has had multiple upper endoscopy procedures. She has history of smoking 1.5 packs/day for almost 60 years.  Her anemia is essentially stable to slightly improved.  Other than mild hyperglycemia; chemistries and electrolytes have been normal.  Review of systems: Patient has been on opiates for an extended period of time and expresses no interest in any weaning or change in the present schedule. Her beta-blocker was changed to extended release because of persistent hypotension.  There has been slight improvement in the systolic blood pressure.  She states that hypotension is a chronic issue and that when she is in severe pain her blood pressure actually goes down.  She describes pain at the ostomy site where there is a hernia as well as in her back and neck.   She states that she is "doing good", gaining strength as well as weight.  She states that she believes she has gained 20 pounds from 117 up to 137.  It is noted that she has a number of high fructose corn syrup containing snacks at her bedside.  She denies any diabetic or other active symptoms.  Constitutional: No fever, fatigue  Eyes: No redness, discharge, pain, vision change ENT/mouth: No nasal  congestion,  purulent discharge, earache, change in hearing, sore throat  Cardiovascular: No chest pain, palpitations, paroxysmal nocturnal dyspnea, claudication, edema  Respiratory: No cough, sputum production, hemoptysis, DOE, significant snoring, apnea   Gastrointestinal: No heartburn, dysphagia,  nausea /vomiting, rectal bleeding, melena, change in stool Genitourinary: No dysuria, hematuria, pyuria, incontinence, nocturia Musculoskeletal: No joint stiffness, joint swelling Dermatologic: No rash, pruritus, change in appearance of skin Neurologic: No dizziness, headache, syncope, seizures, numbness, tingling Psychiatric: No significant anxiety,  insomnia, anorexia Endocrine: No change in hair/skin/nails, excessive thirst, excessive hunger, excessive urination  Hematologic/lymphatic: No significant bruising, lymphadenopathy, abnormal bleeding Allergy/immunology: No itchy/watery eyes, significant sneezing, urticaria, angioedema  Physical exam:  Pertinent or positive findings: Despite the history of weight gain; she still appears somewhat cachectic.  She stares blankly ahead.  Eyebrows are absent.  She is wearing only the upper plate.  First heart sound is slightly increased at the lower left sternal border.  Abdomen is protuberant.  There is a hernia at the ostomy site.  Pedal pulses are decreased.  She has slight clubbing of the nailbeds.  She has extensive keratotic lesions and scars over the shins.  General appearance: no acute distress, increased work of breathing is present.   Lymphatic: No lymphadenopathy about the head, neck, axilla. Eyes: No conjunctival inflammation or lid edema is present. There is no scleral icterus. Ears:  External ear exam shows no significant lesions or deformities.   Nose:  External nasal examination shows no deformity or inflammation. Nasal mucosa are pink and moist without lesions, exudates Oral exam:  Lips and gums are healthy appearing.  Neck:  No  thyromegaly, masses, tenderness noted.    Heart:  Normal rate and regular rhythm.  S2 normal without gallop, murmur, click, rub .  Lungs: Chest clear to auscultation without wheezes, rhonchi, rales, rubs. Abdomen: Bowel sounds are normal. Abdomen is soft and nontender with no organomegaly, masses. GU: Deferred  Extremities:  No cyanosis, edema  Neurologic exam :Balance, Rhomberg, finger to nose testing could not be completed due to clinical state Skin: Warm & dry w/o tenting. No significant rash.  See summary under each active problem in the Problem List with associated updated therapeutic plan '

## 2019-11-08 LAB — COMPREHENSIVE METABOLIC PANEL
Albumin: 3.8 (ref 3.5–5.0)
Calcium: 9.6 (ref 8.7–10.7)
GFR calc Af Amer: 90
GFR calc non Af Amer: 90
Globulin: 1.8

## 2019-11-08 LAB — IRON,TIBC AND FERRITIN PANEL
%SAT: 19.33
Ferritin: 38
Iron: 56
TIBC: 289
UIBC: 233

## 2019-11-08 LAB — LIPID PANEL
Cholesterol: 223 — AB (ref 0–200)
HDL: 50 (ref 35–70)
LDL Cholesterol: 100
LDl/HDL Ratio: 4.5
Triglycerides: 368 — AB (ref 40–160)

## 2019-11-08 LAB — CBC AND DIFFERENTIAL
HCT: 42 (ref 36–46)
Hemoglobin: 13.5 (ref 12.0–16.0)
Neutrophils Absolute: 3.8
Platelets: 207 (ref 150–399)
WBC: 6.1

## 2019-11-08 LAB — BASIC METABOLIC PANEL
BUN: 16 (ref 4–21)
CO2: 31 — AB (ref 13–22)
Chloride: 97 — AB (ref 99–108)
Creatinine: 0.4 — AB (ref 0.5–1.1)
Glucose: 103
Potassium: 3.6 (ref 3.4–5.3)
Sodium: 139 (ref 137–147)

## 2019-11-08 LAB — HEPATIC FUNCTION PANEL
ALT: 17 (ref 7–35)
AST: 26 (ref 13–35)
Alkaline Phosphatase: 113 (ref 25–125)
Bilirubin, Total: 0.2

## 2019-11-08 LAB — VITAMIN D 25 HYDROXY (VIT D DEFICIENCY, FRACTURES): Vit D, 25-Hydroxy: 15.6

## 2019-11-08 LAB — TSH: TSH: 4.17 (ref 0.41–5.90)

## 2019-11-08 LAB — CBC: RBC: 4.79 (ref 3.87–5.11)

## 2019-12-21 LAB — BASIC METABOLIC PANEL WITH GFR
BUN: 10 (ref 4–21)
CO2: 27 — AB (ref 13–22)
Chloride: 96 — AB (ref 99–108)
Creatinine: 0.6 (ref 0.5–1.1)
Glucose: 146
Potassium: 4.4 (ref 3.4–5.3)
Sodium: 138 (ref 137–147)

## 2019-12-21 LAB — CBC AND DIFFERENTIAL
HCT: 39 (ref 36–46)
Hemoglobin: 13.3 (ref 12.0–16.0)
Neutrophils Absolute: 4.8
Platelets: 182 (ref 150–399)
WBC: 5.6

## 2019-12-21 LAB — COMPREHENSIVE METABOLIC PANEL WITH GFR
Calcium: 9.4 (ref 8.7–10.7)
GFR calc Af Amer: 90
GFR calc non Af Amer: 90

## 2019-12-21 LAB — CBC: RBC: 4.46 (ref 3.87–5.11)

## 2019-12-23 LAB — BASIC METABOLIC PANEL
BUN: 9 (ref 4–21)
CO2: 32 — AB (ref 13–22)
Chloride: 95 — AB (ref 99–108)
Creatinine: 0.5 (ref 0.5–1.1)
Glucose: 93
Potassium: 3.5 (ref 3.4–5.3)
Sodium: 137 (ref 137–147)

## 2019-12-23 LAB — CBC AND DIFFERENTIAL
HCT: 43 (ref 36–46)
Hemoglobin: 14.1 (ref 12.0–16.0)
Neutrophils Absolute: 2.8
Platelets: 176 (ref 150–399)
WBC: 4.2

## 2019-12-23 LAB — COMPREHENSIVE METABOLIC PANEL
Calcium: 9.3 (ref 8.7–10.7)
GFR calc Af Amer: 90
GFR calc non Af Amer: 90

## 2019-12-23 LAB — CBC: RBC: 4.87 (ref 3.87–5.11)

## 2019-12-28 LAB — BASIC METABOLIC PANEL
CO2: 32 — AB (ref 13–22)
Chloride: 94 — AB (ref 99–108)
Creatinine: 0.4 — AB (ref 0.5–1.1)
Glucose: 97
Potassium: 2.9 — AB (ref 3.4–5.3)
Sodium: 141 (ref 137–147)

## 2019-12-28 LAB — CBC AND DIFFERENTIAL
HCT: 42 (ref 36–46)
Hemoglobin: 13.9 (ref 12.0–16.0)
Neutrophils Absolute: 6.6
Platelets: 158 (ref 150–399)
WBC: 8

## 2019-12-28 LAB — COMPREHENSIVE METABOLIC PANEL
Calcium: 8.9 (ref 8.7–10.7)
GFR calc Af Amer: 90
GFR calc non Af Amer: 90

## 2019-12-28 LAB — CBC: RBC: 4.77 (ref 3.87–5.11)

## 2020-01-04 ENCOUNTER — Other Ambulatory Visit: Payer: Self-pay | Admitting: Internal Medicine

## 2020-01-04 ENCOUNTER — Encounter: Payer: Self-pay | Admitting: Internal Medicine

## 2020-01-04 ENCOUNTER — Non-Acute Institutional Stay (SKILLED_NURSING_FACILITY): Payer: Medicare Other | Admitting: Internal Medicine

## 2020-01-04 DIAGNOSIS — J418 Mixed simple and mucopurulent chronic bronchitis: Secondary | ICD-10-CM | POA: Diagnosis not present

## 2020-01-04 DIAGNOSIS — U071 COVID-19: Secondary | ICD-10-CM

## 2020-01-04 NOTE — Assessment & Plan Note (Addendum)
Bronchospasm persists; I will discuss course of steroids with slow wean with the [facility] NP.  Continue the Combivent 4 times daily as needed.

## 2020-01-04 NOTE — Patient Instructions (Signed)
See assessment and plan under each diagnosis in the problem list and acutely for this visit 

## 2020-01-04 NOTE — Assessment & Plan Note (Signed)
With the Covid infection she has developed significant bronchospastic component.  Combivent will be continued and a burst of steroids initiated with wean as tolerated.

## 2020-01-04 NOTE — Progress Notes (Signed)
NURSING HOME LOCATION:  [name] ROOM NUMBER:  126-B  CODE STATUS:  DNR  PCP:  Hendricks Limes, MD  [place] [id number]  This is a nursing facility follow up of chronic medical diagnoses.  Interim medical record and care since last [place] visit was updated with review of diagnostic studies and change in clinical status since last visit were documented.  HPI: She has recently been treated for COVID-19 infection with monoclonal antibodies.   Mobile imaging 12/2 was personally reviewed.  This revealed osteopenia; marked increase in the interstitial and slightly nodular lung markings in the right lower lobe suggesting bronchiectasis picture.  There is also increased interstitial change diffusely throughout the lung.  Calcium was present in the aortic knob suggesting aortic atherosclerosis. [facility] 6/8 films were reviewed for comparison.  The right lower lobe changes and interstitial changes were less pronounced on that film.  Left pleural effusion was present at that time but had decreased compared to serial films. Because of delayed improvement; she received Rocephin parenterally for 3 days followed by 5 days of Levaquin.   She is a permanent resident facility with medical diagnoses of aortic atherosclerosis, chronic diastolic heart failure, COPD with history of chronic respiratory failure with hypoxia, hepatic steatosis, history of duodenal ulcer, chronic pain with opioid dependence, dyslipidemia, history of major depression, history of ischemic colitis, and history of adult failure to thrive. Surgical procedures include colostomy.  Review of systems: She states that her respiratory issues are significantly improved.  She continues to have nasal & chest  Congestion. She states that when she turns her head she almost hears a fluid wave. She is indeed producing more sputum and also has nasal production, but because of her blindness she cannot  describe the color or consistency.  The cough has been associated with some nausea but she attributes this to reflux and her chronic abdominal pain at the ostomy site.She describes heat intolerance ; this was elicited because A/C was on.  Constitutional: No fever, significant weight change  ENT/mouth: No sore throat  Cardiovascular: No chest pain, palpitations, paroxysmal nocturnal dyspnea  Gastrointestinal: No heartburn, dysphagia, vomiting, change in bowels Genitourinary: No dysuria, hematuria, pyuria, incontinence Psychiatric: No significant anxiety, depression, insomnia, anorexia Endocrine: No  excessive thirst, excessive hunger, excessive urination  Hematologic/lymphatic: No significant bruising, lymphadenopathy, abnormal bleeding Allergy/immunology: No significant sneezing, urticaria, angioedema  Physical exam:  Pertinent or positive findings: See he is thin and appears chronically ill.  She is blind and stares blankly.  She is completely edentulous.  The oropharynx is dry without exudate or candidiasis.  She has coarse diffuse rhonchi which obscure the heart sounds.  Ostomy associated with significant herniation.  Pedal pulses are decreased.  Limbs are thin.  She has a small boss at the right olecranon area.  General appearance: no acute distress, increased work of breathing is present.   Lymphatic: No lymphadenopathy about the head, neck, axilla. Eyes: No conjunctival inflammation or lid edema is present. There is no scleral icterus. Ears:  External ear exam shows no significant lesions or deformities.   Nose:  External nasal examination shows no deformity or inflammation. Nasal mucosa are pink and moist without lesions, exudates. Neck:  No thyromegaly, masses, tenderness noted.    Lungs:  without wheezes, rales, rubs. Abdomen: Bowel sounds are normal. Abdomen is soft and nontender with no organomegaly. GU: Deferred  Extremities:  No cyanosis, clubbing, edema  Skin: Warm & dry w/o  tenting. No  significant lesions or rash.  See summary under each active problem in the Problem List with associated updated therapeutic plan

## 2020-02-05 ENCOUNTER — Encounter: Payer: Self-pay | Admitting: Internal Medicine

## 2020-02-13 ENCOUNTER — Encounter: Payer: Self-pay | Admitting: Internal Medicine

## 2020-02-19 ENCOUNTER — Encounter: Payer: Self-pay | Admitting: Internal Medicine

## 2020-04-12 ENCOUNTER — Emergency Department (HOSPITAL_COMMUNITY): Payer: Medicare Other

## 2020-04-12 ENCOUNTER — Inpatient Hospital Stay (HOSPITAL_COMMUNITY)
Admission: EM | Admit: 2020-04-12 | Discharge: 2020-04-16 | DRG: 871 | Disposition: A | Discharge status: Skilled Nursing Facility | Payer: Medicare Other | Source: Skilled Nursing Facility | Attending: Internal Medicine | Admitting: Internal Medicine

## 2020-04-12 ENCOUNTER — Encounter (HOSPITAL_COMMUNITY): Payer: Self-pay

## 2020-04-12 DIAGNOSIS — I5032 Chronic diastolic (congestive) heart failure: Secondary | ICD-10-CM | POA: Diagnosis present

## 2020-04-12 DIAGNOSIS — R0902 Hypoxemia: Secondary | ICD-10-CM

## 2020-04-12 DIAGNOSIS — L8990 Pressure ulcer of unspecified site, unspecified stage: Secondary | ICD-10-CM | POA: Insufficient documentation

## 2020-04-12 DIAGNOSIS — J189 Pneumonia, unspecified organism: Secondary | ICD-10-CM | POA: Diagnosis present

## 2020-04-12 DIAGNOSIS — E872 Acidosis: Secondary | ICD-10-CM | POA: Diagnosis present

## 2020-04-12 DIAGNOSIS — I110 Hypertensive heart disease with heart failure: Secondary | ICD-10-CM | POA: Diagnosis present

## 2020-04-12 DIAGNOSIS — Z9981 Dependence on supplemental oxygen: Secondary | ICD-10-CM

## 2020-04-12 DIAGNOSIS — J9601 Acute respiratory failure with hypoxia: Secondary | ICD-10-CM | POA: Diagnosis present

## 2020-04-12 DIAGNOSIS — F32A Depression, unspecified: Secondary | ICD-10-CM | POA: Diagnosis present

## 2020-04-12 DIAGNOSIS — Z801 Family history of malignant neoplasm of trachea, bronchus and lung: Secondary | ICD-10-CM

## 2020-04-12 DIAGNOSIS — G8929 Other chronic pain: Secondary | ICD-10-CM | POA: Diagnosis present

## 2020-04-12 DIAGNOSIS — R6520 Severe sepsis without septic shock: Secondary | ICD-10-CM

## 2020-04-12 DIAGNOSIS — Z933 Colostomy status: Secondary | ICD-10-CM

## 2020-04-12 DIAGNOSIS — Z833 Family history of diabetes mellitus: Secondary | ICD-10-CM

## 2020-04-12 DIAGNOSIS — Z20822 Contact with and (suspected) exposure to covid-19: Secondary | ICD-10-CM | POA: Diagnosis present

## 2020-04-12 DIAGNOSIS — Z66 Do not resuscitate: Secondary | ICD-10-CM | POA: Diagnosis present

## 2020-04-12 DIAGNOSIS — I509 Heart failure, unspecified: Secondary | ICD-10-CM

## 2020-04-12 DIAGNOSIS — L89221 Pressure ulcer of left hip, stage 1: Secondary | ICD-10-CM | POA: Diagnosis present

## 2020-04-12 DIAGNOSIS — R7989 Other specified abnormal findings of blood chemistry: Secondary | ICD-10-CM

## 2020-04-12 DIAGNOSIS — H353290 Exudative age-related macular degeneration, unspecified eye, stage unspecified: Secondary | ICD-10-CM | POA: Diagnosis present

## 2020-04-12 DIAGNOSIS — J690 Pneumonitis due to inhalation of food and vomit: Secondary | ICD-10-CM

## 2020-04-12 DIAGNOSIS — K219 Gastro-esophageal reflux disease without esophagitis: Secondary | ICD-10-CM | POA: Diagnosis present

## 2020-04-12 DIAGNOSIS — M549 Dorsalgia, unspecified: Secondary | ICD-10-CM | POA: Diagnosis present

## 2020-04-12 DIAGNOSIS — R778 Other specified abnormalities of plasma proteins: Secondary | ICD-10-CM

## 2020-04-12 DIAGNOSIS — H548 Legal blindness, as defined in USA: Secondary | ICD-10-CM | POA: Diagnosis present

## 2020-04-12 DIAGNOSIS — E785 Hyperlipidemia, unspecified: Secondary | ICD-10-CM | POA: Diagnosis present

## 2020-04-12 DIAGNOSIS — J440 Chronic obstructive pulmonary disease with acute lower respiratory infection: Secondary | ICD-10-CM | POA: Diagnosis present

## 2020-04-12 DIAGNOSIS — Z8711 Personal history of peptic ulcer disease: Secondary | ICD-10-CM

## 2020-04-12 DIAGNOSIS — A419 Sepsis, unspecified organism: Principal | ICD-10-CM | POA: Diagnosis present

## 2020-04-12 DIAGNOSIS — Z8249 Family history of ischemic heart disease and other diseases of the circulatory system: Secondary | ICD-10-CM

## 2020-04-12 DIAGNOSIS — E871 Hypo-osmolality and hyponatremia: Secondary | ICD-10-CM | POA: Diagnosis present

## 2020-04-12 DIAGNOSIS — J9621 Acute and chronic respiratory failure with hypoxia: Secondary | ICD-10-CM | POA: Diagnosis present

## 2020-04-12 LAB — URINALYSIS, ROUTINE W REFLEX MICROSCOPIC
Bacteria, UA: NONE SEEN
Bilirubin Urine: NEGATIVE
Glucose, UA: NEGATIVE mg/dL
Hgb urine dipstick: NEGATIVE
Ketones, ur: NEGATIVE mg/dL
Nitrite: NEGATIVE
Protein, ur: NEGATIVE mg/dL
Specific Gravity, Urine: 1.027 (ref 1.005–1.030)
pH: 5 (ref 5.0–8.0)

## 2020-04-12 LAB — CBC WITH DIFFERENTIAL/PLATELET
Abs Immature Granulocytes: 0.1 10*3/uL — ABNORMAL HIGH (ref 0.00–0.07)
Basophils Absolute: 0.1 10*3/uL (ref 0.0–0.1)
Basophils Relative: 0 %
Eosinophils Absolute: 0 10*3/uL (ref 0.0–0.5)
Eosinophils Relative: 0 %
HCT: 40.4 % (ref 36.0–46.0)
Hemoglobin: 13.6 g/dL (ref 12.0–15.0)
Immature Granulocytes: 1 %
Lymphocytes Relative: 4 %
Lymphs Abs: 0.8 10*3/uL (ref 0.7–4.0)
MCH: 29.2 pg (ref 26.0–34.0)
MCHC: 33.7 g/dL (ref 30.0–36.0)
MCV: 86.9 fL (ref 80.0–100.0)
Monocytes Absolute: 1.3 10*3/uL — ABNORMAL HIGH (ref 0.1–1.0)
Monocytes Relative: 7 %
Neutro Abs: 16.9 10*3/uL — ABNORMAL HIGH (ref 1.7–7.7)
Neutrophils Relative %: 88 %
Platelets: 350 10*3/uL (ref 150–400)
RBC: 4.65 MIL/uL (ref 3.87–5.11)
RDW: 14 % (ref 11.5–15.5)
WBC: 19.1 10*3/uL — ABNORMAL HIGH (ref 4.0–10.5)
nRBC: 0 % (ref 0.0–0.2)

## 2020-04-12 LAB — LIPASE, BLOOD: Lipase: 23 U/L (ref 11–51)

## 2020-04-12 LAB — TROPONIN I (HIGH SENSITIVITY): Troponin I (High Sensitivity): 189 ng/L (ref <18)

## 2020-04-12 LAB — COMPREHENSIVE METABOLIC PANEL
ALT: 26 U/L (ref 0–44)
AST: 34 U/L (ref 15–41)
Albumin: 2.8 g/dL — ABNORMAL LOW (ref 3.5–5.0)
Alkaline Phosphatase: 100 U/L (ref 38–126)
Anion gap: 12 (ref 5–15)
BUN: 21 mg/dL (ref 8–23)
CO2: 27 mmol/L (ref 22–32)
Calcium: 8.6 mg/dL — ABNORMAL LOW (ref 8.9–10.3)
Chloride: 91 mmol/L — ABNORMAL LOW (ref 98–111)
Creatinine, Ser: 0.85 mg/dL (ref 0.44–1.00)
GFR, Estimated: >60 mL/min (ref >60)
Glucose, Bld: 147 mg/dL — ABNORMAL HIGH (ref 70–99)
Potassium: 3.7 mmol/L (ref 3.5–5.1)
Sodium: 130 mmol/L — ABNORMAL LOW (ref 135–145)
Total Bilirubin: 1.1 mg/dL (ref 0.3–1.2)
Total Protein: 6.6 g/dL (ref 6.5–8.1)

## 2020-04-12 LAB — LACTIC ACID, PLASMA: Lactic Acid, Venous: 2 mmol/L (ref 0.5–1.9)

## 2020-04-12 LAB — RESP PANEL BY RT-PCR (FLU A&B, COVID) ARPGX2
Influenza A by PCR: NEGATIVE
Influenza B by PCR: NEGATIVE
SARS Coronavirus 2 by RT PCR: NEGATIVE

## 2020-04-12 LAB — I-STAT VENOUS BLOOD GAS, ED
Acid-Base Excess: 9 mmol/L — ABNORMAL HIGH (ref 0.0–2.0)
Bicarbonate: 33.1 mmol/L — ABNORMAL HIGH (ref 20.0–28.0)
Calcium, Ion: 0.91 mmol/L — ABNORMAL LOW (ref 1.15–1.40)
HCT: 42 % (ref 36.0–46.0)
Hemoglobin: 14.3 g/dL (ref 12.0–15.0)
O2 Saturation: 99 %
Potassium: 6.5 mmol/L (ref 3.5–5.1)
Sodium: 128 mmol/L — ABNORMAL LOW (ref 135–145)
TCO2: 34 mmol/L — ABNORMAL HIGH (ref 22–32)
pCO2, Ven: 42.4 mmHg — ABNORMAL LOW (ref 44.0–60.0)
pH, Ven: 7.5 — ABNORMAL HIGH (ref 7.250–7.430)
pO2, Ven: 137 mmHg — ABNORMAL HIGH (ref 32.0–45.0)

## 2020-04-12 LAB — BRAIN NATRIURETIC PEPTIDE: B Natriuretic Peptide: 229.8 pg/mL — ABNORMAL HIGH (ref 0.0–100.0)

## 2020-04-12 MED ORDER — SODIUM CHLORIDE 0.9 % IV SOLN
2.0000 g | Freq: Once | INTRAVENOUS | Status: AC
  Administered 2020-04-12: 2 g via INTRAVENOUS
  Filled 2020-04-12: qty 2

## 2020-04-12 MED ORDER — IOHEXOL 350 MG/ML SOLN
60.0000 mL | Freq: Once | INTRAVENOUS | Status: AC | PRN
  Administered 2020-04-12: 60 mL via INTRAVENOUS

## 2020-04-12 MED ORDER — SODIUM CHLORIDE 0.9 % IV SOLN: Freq: Once | INTRAVENOUS | Status: AC

## 2020-04-12 MED ORDER — VANCOMYCIN HCL 1250 MG/250ML IV SOLN
1250.0000 mg | Freq: Once | INTRAVENOUS | Status: AC
  Administered 2020-04-12: 1250 mg via INTRAVENOUS
  Filled 2020-04-12 (×2): qty 250

## 2020-04-12 MED ORDER — VANCOMYCIN HCL 1000 MG/200ML IV SOLN
1000.0000 mg | INTRAVENOUS | Status: DC
  Administered 2020-04-13 – 2020-04-15 (×3): 1000 mg via INTRAVENOUS
  Filled 2020-04-12 (×4): qty 200

## 2020-04-12 MED ORDER — SODIUM CHLORIDE 0.9 % IV SOLN
2.0000 g | Freq: Two times a day (BID) | INTRAVENOUS | Status: DC
  Administered 2020-04-13 – 2020-04-16 (×8): 2 g via INTRAVENOUS
  Filled 2020-04-12 (×9): qty 2

## 2020-04-12 NOTE — ED Notes (Signed)
RT to bedside

## 2020-04-12 NOTE — ED Notes (Signed)
ED Provider at bedside. RT at bedside 
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
## 2020-04-12 NOTE — ED Notes (Signed)
Attending informed of Pt Oxygen saturation

## 2020-04-12 NOTE — Progress Notes (Signed)
Pharmacy Antibiotic Note  [name] Bell is a 75 y.o. female admitted on 04/12/2020 with pneumonia.  Pharmacy has been consulted for cefepime and vancomycin dosing.  SCr wnl, WBC 19, LA 2   Plan: -Cefepime 2 gm IV Q 12 hours  -Vancomycin 1250 mg IV load followed by 1000 mg IV Q 24 hrs. Goal AUC 400-550. Expected AUC: 517 SCr used: 0.85 -Monitor CBC, renal fx, cultures and clinical progress -Vanc levels as indicated       Temp (24hrs), Avg:97.9 F (36.6 C), Min:97.9 F (36.6 C), Max:97.9 F (36.6 C)  No results for input(s): WBC, CREATININE, LATICACIDVEN, VANCOTROUGH, VANCOPEAK, VANCORANDOM, GENTTROUGH, GENTPEAK, GENTRANDOM, TOBRATROUGH, TOBRAPEAK, TOBRARND, AMIKACINPEAK, AMIKACINTROU, AMIKACIN in the last 168 hours.  CrCl cannot be calculated (Patient's most recent lab result is older than the maximum 21 days allowed.).    Allergies  Allergen Reactions  . Strawberry (Diagnostic) Rash  . Duloxetine Nausea And Vomiting  . Ibuprofen Other (See Comments)    REACTION: Bleeding ulcers  . Nsaids Other (See Comments)    REACTION: Bleeding Ulcer  . Latex Itching    Antimicrobials this admission: Cefepime 3/18 >>  Vanc 3/18 >>   Dose adjustments this admission:  Microbiology results: 3/18 BCx:   Thank you for allowing pharmacy to be a part of this patient's care.  Albertina Parr, PharmD., BCPS, BCCCP Clinical Pharmacist Please refer to [facility] for unit-specific pharmacist

## 2020-04-12 NOTE — Progress Notes (Signed)
RT called to patient's room due to desaturation. Upon arrival SpO2 was reading at 80% on a NRB. Patient was placed on a HFNC at 15L in addition to NRB at [place]. Patient states that she is claustrophobic & cannot stand the NRB. At this time the patient does not wish to wear BIPAP. MD & RN are aware.

## 2020-04-12 NOTE — ED Notes (Signed)
Unsuccessful IV stick x2.

## 2020-04-12 NOTE — ED Triage Notes (Addendum)
From [facility] for cellulitis. Sats 80's with rhonchi at facility, given albuterol x 1 and atrovent, afterwards patient had a forceful cough with bloody sputum and sats were 66% on 5 liters. Medic placed nonrebreather with sat 81-83%, decreased air movement left, clear right. BP 110 40, HR 130 ST.

## 2020-04-12 NOTE — ED Notes (Signed)
RT called to bedside.

## 2020-04-13 DIAGNOSIS — H548 Legal blindness, as defined in USA: Secondary | ICD-10-CM | POA: Diagnosis present

## 2020-04-13 DIAGNOSIS — I509 Heart failure, unspecified: Secondary | ICD-10-CM

## 2020-04-13 DIAGNOSIS — J9621 Acute and chronic respiratory failure with hypoxia: Secondary | ICD-10-CM | POA: Diagnosis present

## 2020-04-13 DIAGNOSIS — Z9981 Dependence on supplemental oxygen: Secondary | ICD-10-CM | POA: Diagnosis not present

## 2020-04-13 DIAGNOSIS — L89221 Pressure ulcer of left hip, stage 1: Secondary | ICD-10-CM | POA: Diagnosis present

## 2020-04-13 DIAGNOSIS — R6520 Severe sepsis without septic shock: Secondary | ICD-10-CM

## 2020-04-13 DIAGNOSIS — I110 Hypertensive heart disease with heart failure: Secondary | ICD-10-CM | POA: Diagnosis present

## 2020-04-13 DIAGNOSIS — Z933 Colostomy status: Secondary | ICD-10-CM | POA: Diagnosis not present

## 2020-04-13 DIAGNOSIS — E785 Hyperlipidemia, unspecified: Secondary | ICD-10-CM | POA: Diagnosis present

## 2020-04-13 DIAGNOSIS — Z20822 Contact with and (suspected) exposure to covid-19: Secondary | ICD-10-CM | POA: Diagnosis present

## 2020-04-13 DIAGNOSIS — J189 Pneumonia, unspecified organism: Secondary | ICD-10-CM

## 2020-04-13 DIAGNOSIS — A419 Sepsis, unspecified organism: Principal | ICD-10-CM

## 2020-04-13 DIAGNOSIS — K219 Gastro-esophageal reflux disease without esophagitis: Secondary | ICD-10-CM | POA: Diagnosis present

## 2020-04-13 DIAGNOSIS — E872 Acidosis: Secondary | ICD-10-CM | POA: Diagnosis present

## 2020-04-13 DIAGNOSIS — I5032 Chronic diastolic (congestive) heart failure: Secondary | ICD-10-CM | POA: Diagnosis present

## 2020-04-13 DIAGNOSIS — H353290 Exudative age-related macular degeneration, unspecified eye, stage unspecified: Secondary | ICD-10-CM | POA: Diagnosis present

## 2020-04-13 DIAGNOSIS — Z833 Family history of diabetes mellitus: Secondary | ICD-10-CM | POA: Diagnosis not present

## 2020-04-13 DIAGNOSIS — Z66 Do not resuscitate: Secondary | ICD-10-CM | POA: Diagnosis present

## 2020-04-13 DIAGNOSIS — E871 Hypo-osmolality and hyponatremia: Secondary | ICD-10-CM | POA: Diagnosis present

## 2020-04-13 DIAGNOSIS — J440 Chronic obstructive pulmonary disease with acute lower respiratory infection: Secondary | ICD-10-CM | POA: Diagnosis present

## 2020-04-13 DIAGNOSIS — R778 Other specified abnormalities of plasma proteins: Secondary | ICD-10-CM | POA: Diagnosis not present

## 2020-04-13 DIAGNOSIS — G8929 Other chronic pain: Secondary | ICD-10-CM | POA: Diagnosis present

## 2020-04-13 DIAGNOSIS — Z801 Family history of malignant neoplasm of trachea, bronchus and lung: Secondary | ICD-10-CM | POA: Diagnosis not present

## 2020-04-13 DIAGNOSIS — J690 Pneumonitis due to inhalation of food and vomit: Secondary | ICD-10-CM

## 2020-04-13 DIAGNOSIS — F32A Depression, unspecified: Secondary | ICD-10-CM

## 2020-04-13 DIAGNOSIS — J9601 Acute respiratory failure with hypoxia: Secondary | ICD-10-CM | POA: Diagnosis not present

## 2020-04-13 DIAGNOSIS — M549 Dorsalgia, unspecified: Secondary | ICD-10-CM | POA: Diagnosis present

## 2020-04-13 DIAGNOSIS — Z8249 Family history of ischemic heart disease and other diseases of the circulatory system: Secondary | ICD-10-CM | POA: Diagnosis not present

## 2020-04-13 DIAGNOSIS — Z8711 Personal history of peptic ulcer disease: Secondary | ICD-10-CM | POA: Diagnosis not present

## 2020-04-13 LAB — I-STAT ARTERIAL BLOOD GAS, ED
Acid-Base Excess: 3 mmol/L — ABNORMAL HIGH (ref 0.0–2.0)
Bicarbonate: 27.9 mmol/L (ref 20.0–28.0)
Calcium, Ion: 1.19 mmol/L (ref 1.15–1.40)
HCT: 38 % (ref 36.0–46.0)
Hemoglobin: 12.9 g/dL (ref 12.0–15.0)
O2 Saturation: 96 %
Patient temperature: 97.9
Potassium: 3.3 mmol/L — ABNORMAL LOW (ref 3.5–5.1)
Sodium: 131 mmol/L — ABNORMAL LOW (ref 135–145)
TCO2: 29 mmol/L (ref 22–32)
pCO2 arterial: 42.2 mmHg (ref 32.0–48.0)
pH, Arterial: 7.427 (ref 7.350–7.450)
pO2, Arterial: 77 mmHg — ABNORMAL LOW (ref 83.0–108.0)

## 2020-04-13 LAB — CBC
HCT: 37.4 % (ref 36.0–46.0)
Hemoglobin: 12.5 g/dL (ref 12.0–15.0)
MCH: 29 pg (ref 26.0–34.0)
MCHC: 33.4 g/dL (ref 30.0–36.0)
MCV: 86.8 fL (ref 80.0–100.0)
Platelets: 271 10*3/uL (ref 150–400)
RBC: 4.31 MIL/uL (ref 3.87–5.11)
RDW: 13.9 % (ref 11.5–15.5)
WBC: 18.7 10*3/uL — ABNORMAL HIGH (ref 4.0–10.5)
nRBC: 0 % (ref 0.0–0.2)

## 2020-04-13 LAB — LACTIC ACID, PLASMA: Lactic Acid, Venous: 1.3 mmol/L (ref 0.5–1.9)

## 2020-04-13 LAB — TROPONIN I (HIGH SENSITIVITY): Troponin I (High Sensitivity): 184 ng/L (ref <18)

## 2020-04-13 MED ORDER — METOPROLOL SUCCINATE ER 25 MG PO TB24: 25.0000 mg | ORAL_TABLET | Freq: Every day | ORAL | Status: DC

## 2020-04-13 MED ORDER — HYDROCODONE-ACETAMINOPHEN 5-325 MG PO TABS
1.0000 | ORAL_TABLET | Freq: Four times a day (QID) | ORAL | Status: DC | PRN
Start: 2020-04-13 — End: 2020-04-17
  Administered 2020-04-13 – 2020-04-16 (×11): 1 via ORAL
  Filled 2020-04-13 (×11): qty 1

## 2020-04-13 MED ORDER — BOOST / RESOURCE BREEZE PO LIQD CUSTOM
1.0000 | Freq: Two times a day (BID) | ORAL | Status: DC
  Administered 2020-04-13 – 2020-04-16 (×7): 1 via ORAL
  Filled 2020-04-13 (×2): qty 1

## 2020-04-13 MED ORDER — SODIUM CHLORIDE 3 % IN NEBU
4.0000 mL | INHALATION_SOLUTION | Freq: Every day | RESPIRATORY_TRACT | Status: AC
  Administered 2020-04-13 – 2020-04-15 (×3): 4 mL via RESPIRATORY_TRACT
  Filled 2020-04-13 (×3): qty 4

## 2020-04-13 MED ORDER — OLOPATADINE HCL 0.1 % OP SOLN
1.0000 [drp] | Freq: Two times a day (BID) | OPHTHALMIC | Status: DC
  Administered 2020-04-13 – 2020-04-16 (×8): 1 [drp] via OPHTHALMIC
  Filled 2020-04-13: qty 5

## 2020-04-13 MED ORDER — ACETAMINOPHEN 650 MG RE SUPP: 650.0000 mg | Freq: Four times a day (QID) | RECTAL | Status: DC | PRN

## 2020-04-13 MED ORDER — DULOXETINE HCL 60 MG PO CPEP
60.0000 mg | ORAL_CAPSULE | Freq: Every day | ORAL | Status: DC
  Administered 2020-04-13 – 2020-04-16 (×4): 60 mg via ORAL
  Filled 2020-04-13 (×4): qty 1

## 2020-04-13 MED ORDER — DOCUSATE SODIUM 100 MG PO CAPS
100.0000 mg | ORAL_CAPSULE | Freq: Two times a day (BID) | ORAL | Status: DC
  Administered 2020-04-13 – 2020-04-16 (×6): 100 mg via ORAL
  Filled 2020-04-13 (×7): qty 1

## 2020-04-13 MED ORDER — MOMETASONE FURO-FORMOTEROL FUM 200-5 MCG/ACT IN AERO
2.0000 | INHALATION_SPRAY | Freq: Two times a day (BID) | RESPIRATORY_TRACT | Status: DC
  Administered 2020-04-13 – 2020-04-16 (×7): 2 via RESPIRATORY_TRACT
  Filled 2020-04-13: qty 8.8

## 2020-04-13 MED ORDER — ALBUTEROL SULFATE (2.5 MG/3ML) 0.083% IN NEBU
2.5000 mg | INHALATION_SOLUTION | RESPIRATORY_TRACT | Status: DC | PRN
  Administered 2020-04-14 – 2020-04-15 (×3): 2.5 mg via RESPIRATORY_TRACT
  Filled 2020-04-13 (×4): qty 3

## 2020-04-13 MED ORDER — SODIUM CHLORIDE 0.9 % IV SOLN: INTRAVENOUS | Status: AC

## 2020-04-13 MED ORDER — ADULT MULTIVITAMIN W/MINERALS CH
1.0000 | ORAL_TABLET | Freq: Every day | ORAL | Status: DC
  Administered 2020-04-13 – 2020-04-16 (×4): 1 via ORAL
  Filled 2020-04-13 (×4): qty 1

## 2020-04-13 MED ORDER — ENOXAPARIN SODIUM 40 MG/0.4ML ~~LOC~~ SOLN
40.0000 mg | SUBCUTANEOUS | Status: DC
  Administered 2020-04-13 – 2020-04-15 (×3): 40 mg via SUBCUTANEOUS
  Filled 2020-04-13 (×3): qty 0.4

## 2020-04-13 MED ORDER — FAMOTIDINE 20 MG PO TABS
20.0000 mg | ORAL_TABLET | Freq: Every day | ORAL | Status: DC
  Administered 2020-04-13 – 2020-04-16 (×4): 20 mg via ORAL
  Filled 2020-04-13 (×4): qty 1

## 2020-04-13 MED ORDER — ACETAMINOPHEN 325 MG PO TABS
650.0000 mg | ORAL_TABLET | Freq: Four times a day (QID) | ORAL | Status: DC | PRN
  Filled 2020-04-13: qty 2

## 2020-04-13 MED ORDER — PRAVASTATIN SODIUM 40 MG PO TABS
40.0000 mg | ORAL_TABLET | Freq: Every day | ORAL | Status: DC
  Administered 2020-04-13 – 2020-04-16 (×4): 40 mg via ORAL
  Filled 2020-04-13 (×3): qty 1

## 2020-04-13 NOTE — ED Notes (Signed)
Placed on left side.

## 2020-04-13 NOTE — ED Notes (Addendum)
Patient remains on continuous cardiac and 02 sat monitoring, remains on 15L hi flow [place] and with 15L 02 nonrebreather  mask.  A/A/O. Peri care done, purwick catheter changed and remains to 8mm continuous sx and clear amber urine noted in cannister, skin care given. LLQ colostomy bag with small amount dark brown stool and gas noted.

## 2020-04-13 NOTE — ED Notes (Signed)
Placed on right side. Tolerating well. Congested cough noted.
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
## 2020-04-13 NOTE — Progress Notes (Signed)
PROGRESS NOTE    [name] Bell  IOE:[id number] DOB: 1945/09/27 DOA: 04/12/2020 PCP: Hendricks Limes, MD   Brief Narrative:   [name] Bell is a 75 y.o. female with medical history significant of hypertension, hyperlipidemia, legally blind, ischemic colitis, duodenal ulcer, small bowel perforation status post resection with residual colostomy, chronic pain, chronic diastolic CHF, COPD, chronic respiratory failure on 2 L home oxygen presenting to the ED from her nursing home for evaluation of shortness of breath.  Reportedly sats as low as 60s at her nursing home and EMS placed nonrebreather.  She was tachycardic with EMS. Patient reports 64-month history of shortness of breath, worse for the past 1 day.  She is also coughing and cough is productive of dark yellow sputum.  Denies chest pain or fevers.  She is fully vaccinated against COVID including booster shot.  States she uses 4 L home oxygen all the time.  No other complaints.   Assessment & Plan:   Principal Problem:   Acute hypoxemic respiratory failure (HCC) Active Problems:   Pneumonia   Severe sepsis (HCC)   Elevated troponin   CHF (congestive heart failure) (HCC)   Severe sepsis and acute on chronic hypoxemic respiratory failure secondary to pneumonia AS  PREV NOTED: -Reportedly sats were in the 60s at her nursing home and was placed on nonrebreather by EMS.  In the ED, satting 80% on nonrebreather.  Patient declined BiPAP.  She was placed on HFNC at 15 L with additional 15 L via nonrebreather and sats now improved to the 90s. ABG with pH 7.42, PCO2 42, PO2 77.   -Meets criteria for sepsis due to tachycardia, tachypnea, leukocytosis, and mild lactic acidosis. -SARS-CoV-2 PCR test negative.  Influenza panel negative. -CT angiogram chest negative for PE.  Showing interval progression of peribronchial thickening of the right lower lobe/right infrahilar region with narrowing of the right lower lobe bronchus.  Consolidative changes of  the majority of the left lower lobe concerning for postobstructive atelectasis or infiltrate.  There is high-grade narrowing of the left lower lobe bronchus which may be related to mucus impaction/aspiration.  Centrally occlusive lesion not excluded.  Scattered nodularity in the left upper lobe and lingula may represent atypical infection.  Small left pleural effusion.  Mediastinal and hilar adenopathy. -PCCM MD felt that the patient would not be able to tolerate bronchoscopy.  He recommended aggressive pulmonary toilet including vibration (vest or bed), bronchodilators, saline nebs, laying on right side.  RT has been consulted. -Continue antibiotics -vancomycin and cefepime. -BNP only mildly elevated, does not appear overtly volume overloaded on exam.  Will give gentle IV fluid hydration as she continues to be tachycardic and blood pressure currently soft-THOUGH PT REPORTS TYPICALLY IS LOW. -Blood culture x2 pending -Continue to monitor WBC count -Trend lactate  Troponin elevation: Likely due to demand ischemia. High-sensitivity troponin elevated but stable (189 > 184).  EKG without acute ischemic changes.  Patient denies chest pain. -Cardiac monitoring  Chronic diastolic CHF -No significant elevation of BNP.  Hold diuretics X 1 DAY.  Giving gentle IV fluid hydration given severe sepsis and soft blood pressure.  Monitor volume status very closely. Likely restart in AM  Mild hyponatremia -Continue to monitor  COPD: No signs of acute exacerbation. -Continue bronchodilator treatments  Depression -Continue Cymbalta  GERD -Continue Pepcid  Hyperlipidemia -Continue statin   DVT prophylaxis: Lovenox SQ  Code Status: dnr    Code Status Orders  (From admission, onward)  Start     Ordered   04/13/20 0420  Do not attempt resuscitation (DNR)  Continuous       Question Answer Comment  In the event of cardiac or respiratory ARREST Do not call a "code blue"   In the event  of cardiac or respiratory ARREST Do not perform Intubation, CPR, defibrillation or ACLS   In the event of cardiac or respiratory ARREST Use medication by any route, position, wound care, and other measures to relive pain and suffering. May use oxygen, suction and manual treatment of airway obstruction as needed for comfort.      04/13/20 0420        Code Status History    Date Active Date Inactive Code Status Order ID Comments User Context   04/13/2020 0042 04/13/2020 0420 DNR [id number]  Merrily Pew, MD ED   07/03/2019 1536 07/07/2019 1946 Full Code [id number]  Norval Morton, MD ED   04/19/2019 0513 04/25/2019 2004 Full Code [id number]  Lars Mage, MD ED   03/24/2018 0506 03/28/2018 1923 Full Code [id number]  Valinda Party, DO ED   07/02/2017 2309 07/06/2017 1955 Full Code [id number]  Jule Ser, DO ED   06/03/2017 2230 06/08/2017 1917 Full Code [id number]  Zada Finders, MD Inpatient   03/31/2016 2112 04/07/2016 1704 Full Code [id number]  Lily Kocher, MD ED   09/02/2015 1721 09/04/2015 2316 Full Code [id number]  Norman Herrlich, MD ED   10/11/2013 1918 10/13/2013 1927 Full Code [id number]  Cresenciano Genre Inpatient   09/29/2012 0528 10/02/2012 1828 Full Code [id number]  Clinton Gallant, MD Inpatient   07/30/2011 0425 08/11/2011 1529 Full Code [id number]  Johney Maine, Revonda Standard., MD ED   Advance Care Planning Activity     Family Communication: none today-overnight admit  Disposition Plan:    Status is: Inpatient  Remains inpatient appropriate because:Inpatient level of care appropriate due to severity of illness   Dispo: The patient is from: SNF  Anticipated d/c is to: SNF  Patient currently is not medically stable to d/c.              Difficult to place patient No Consults called: None Admission status: Inpatient   Consultants:   None  Procedures:  CT Angio Chest PE W and/or Wo Contrast  Result Date: 04/13/2020 CLINICAL DATA:  75 year old female with hypoxia. EXAM: CT  ANGIOGRAPHY CHEST WITH CONTRAST TECHNIQUE: Multidetector CT imaging of the chest was performed using the standard protocol during bolus administration of intravenous contrast. Multiplanar CT image reconstructions and MIPs were obtained to evaluate the vascular anatomy. CONTRAST:  64mL OMNIPAQUE IOHEXOL 350 MG/ML SOLN COMPARISON:  Chest CT dated 07/03/2019. FINDINGS: Evaluation is limited due to streak artifact caused by patient's arms. Cardiovascular: There is no cardiomegaly or pericardial effusion. There is coronary vascular calcification of the LAD. Moderate atherosclerotic calcification of the thoracic aorta. No aneurysmal dilatation. There is dilatation of the main pulmonary trunk suggestive of pulmonary hypertension. No pulmonary artery embolus identified. Mediastinum/Nodes: Enlarged hilar and mediastinal lymph nodes measuring 17 mm in the right hilum, and 15 mm in short axis in the prevascular space. The esophagus and the thyroid gland are grossly unremarkable. No mediastinal fluid collection. Lungs/Pleura: Background of emphysema. There is consolidative changes of the majority of the left lower lobe with overall volume loss. There is high-grade narrowing of the left lower lobe bronchus which may be related to mucous impaction/aspiration. A centrally occlusive lesion is not excluded. There is diffuse peribronchial thickening. There is progression of  peribronchial thickening of the right lower lobe/right infrahilar region with narrowing of the right lower lobe bronchus. Scattered nodularity in the left upper lobe and lingula may be chronic or represent atypical infection. Clinical correlation and follow-up to resolution recommended. Small left pleural effusion. No pneumothorax. Upper Abdomen: No acute abnormality. Musculoskeletal: Osteopenia with multilevel chronic appearing compression fracture and thoracic kyphosis. No acute osseous pathology. Review of the MIP images confirms the above findings. IMPRESSION:  1. No CT evidence of pulmonary artery embolus. 2. Interval progression of peribronchial thickening of the right lower lobe/right infrahilar region with narrowing of the right lower lobe bronchus. 3. Consolidative changes of the majority of the left lower lobe concerning for postobstructive atelectasis or infiltrate. There is high-grade narrowing of the left lower lobe bronchus which may be related to mucous impaction/aspiration. A centrally occlusive lesion is not excluded. Clinical correlation and follow-up to resolution recommended. 4. Scattered nodularity in the left upper lobe and lingula may represent atypical infection. Clinical correlation and follow-up to resolution recommended. 5. Small left pleural effusion. 6. Mediastinal and hilar adenopathy. 7. Aortic Atherosclerosis (ICD10-I70.0) and Emphysema (ICD10-J43.9). Electronically Signed   By: Anner Crete M.D.   On: 04/13/2020 00:00   DG Chest Portable 1 View  Result Date: 04/12/2020 CLINICAL DATA:  Shortness of breath EXAM: PORTABLE CHEST 1 VIEW COMPARISON:  07/04/2019 FINDINGS: Heart is normal size. Volume loss on the left. Extensive airspace disease throughout the left lung concerning for pneumonia. No confluent opacity on the right. Scarring in the right base. No effusions. IMPRESSION: Volume loss and airspace disease throughout the left lung concerning for pneumonia. Electronically Signed   By: Rolm Baptise M.D.   On: 04/12/2020 19:09     Antimicrobials:   Cefepime and vanc day 2   Subjective: Pt has been weaned off non-rebreather, complains of pain asking for muscle relaxers and pain meds, BP is soft  Objective: Vitals:   04/13/20 1100 04/13/20 1130 04/13/20 1200 04/13/20 1430  BP: (!) 108/56 (!) 104/51 (!) 109/57 (!) 97/54  Pulse: (!) 110 (!) 111 (!) 113 (!) 117  Resp: (!) 26 (!) 25 (!) 28 (!) 24  Temp:  97.9 F (36.6 C)    TempSrc:  Oral    SpO2: 93% 95% 96% 100%  Weight:      Height:        Intake/Output Summary  (Last 24 hours) at 04/13/2020 1519 Last data filed at 04/13/2020 1001 Gross per 24 hour  Intake 94.69 ml  Output --  Net 94.69 ml   Filed Weights   04/13/20 0400  Weight: 59 kg    Examination:  General exam: Weaned off NRB, Rhonchi bilaterally.WANTS PAIN MEDS Cardiovascular system: sinus tach, no murmur noted Gastrointestinal system: Abdomen is nondistended, soft and nontender. Colostomy noted,  Normal bowel sounds heard. Central nervous system: Alert and oriented. No focal neurological deficits. gloablly weak Extremities: WWP, no edema Skin: No rashes, lesions or ulcers Psychiatry: Judgement and insight are poor.     Data Reviewed: I have personally reviewed following labs and imaging studies  CBC: Recent Labs  Lab 04/12/20 1806 04/12/20 1956 04/13/20 0035 04/13/20 0448  WBC 19.1*  --   --  18.7*  NEUTROABS 16.9*  --   --   --   HGB 13.6 14.3 12.9 12.5  HCT 40.4 42.0 38.0 37.4  MCV 86.9  --   --  86.8  PLT 350  --   --  093   Basic Metabolic Panel: Recent Labs  Lab 04/12/20 1956 04/12/20 2015 04/13/20 0035  NA 128* 130* 131*  K 6.5* 3.7 3.3*  CL  --  91*  --   CO2  --  27  --   GLUCOSE  --  147*  --   BUN  --  21  --   CREATININE  --  0.85  --   CALCIUM  --  8.6*  --    GFR: Estimated Creatinine Clearance: 48.6 mL/min (by C-G formula based on SCr of 0.85 mg/dL). Liver Function Tests: Recent Labs  Lab 04/12/20 2015  AST 34  ALT 26  ALKPHOS 100  BILITOT 1.1  PROT 6.6  ALBUMIN 2.8*   Recent Labs  Lab 04/12/20 2015  LIPASE 23   No results for input(s): AMMONIA in the last 168 hours. Coagulation Profile: No results for input(s): INR, PROTIME in the last 168 hours. Cardiac Enzymes: No results for input(s): CKTOTAL, CKMB, CKMBINDEX, TROPONINI in the last 168 hours. BNP (last 3 results) No results for input(s): PROBNP in the last 8760 hours. HbA1C: No results for input(s): HGBA1C in the last 72 hours. CBG: No results for input(s): GLUCAP in  the last 168 hours. Lipid Profile: No results for input(s): CHOL, HDL, LDLCALC, TRIG, CHOLHDL, LDLDIRECT in the last 72 hours. Thyroid Function Tests: No results for input(s): TSH, T4TOTAL, FREET4, T3FREE, THYROIDAB in the last 72 hours. Anemia Panel: No results for input(s): VITAMINB12, FOLATE, FERRITIN, TIBC, IRON, RETICCTPCT in the last 72 hours. Sepsis Labs: Recent Labs  Lab 04/12/20 1807 04/13/20 1306  LATICACIDVEN 2.0* 1.3    Recent Results (from the past 240 hour(s))  Resp Panel by RT-PCR (Flu A&B, Covid) Nasopharyngeal Swab     Status: None   Collection Time: 04/12/20  6:14 PM   Specimen: Nasopharyngeal Swab; Nasopharyngeal(NP) swabs in vial transport medium  Result Value Ref Range Status   SARS Coronavirus 2 by RT PCR NEGATIVE NEGATIVE Final    Comment: (NOTE) SARS-CoV-2 target nucleic acids are NOT DETECTED.  The SARS-CoV-2 RNA is generally detectable in upper respiratory specimens during the acute phase of infection. The lowest concentration of SARS-CoV-2 viral copies this assay can detect is 138 copies/mL. A negative result does not preclude SARS-Cov-2 infection and should not be used as the sole basis for treatment or other patient management decisions. A negative result may occur with  improper specimen collection/handling, submission of specimen other than nasopharyngeal swab, presence of viral mutation(s) within the areas targeted by this assay, and inadequate number of viral copies(<138 copies/mL). A negative result must be combined with clinical observations, patient history, and epidemiological information. The expected result is Negative.  Fact Sheet for Patients:  EntrepreneurPulse.com.au  Fact Sheet for Healthcare Providers:  IncredibleEmployment.be  This test is no t yet approved or cleared by the [place] FDA and  has been authorized for detection and/or diagnosis of SARS-CoV-2 by FDA under an Emergency Use  Authorization (EUA). This EUA will remain  in effect (meaning this test can be used) for the duration of the COVID-19 declaration under Section 564(b)(1) of the Act, 21 U.S.C.section 360bbb-3(b)(1), unless the authorization is terminated  or revoked sooner.       Influenza A by PCR NEGATIVE NEGATIVE Final   Influenza B by PCR NEGATIVE NEGATIVE Final    Comment: (NOTE) The Xpert Xpress SARS-CoV-2/FLU/RSV plus assay is intended as an aid in the diagnosis of influenza from Nasopharyngeal swab specimens and should not be used as a sole basis for treatment. Nasal washings and  aspirates are unacceptable for Xpert Xpress SARS-CoV-2/FLU/RSV testing.  Fact Sheet for Patients: EntrepreneurPulse.com.au  Fact Sheet for Healthcare Providers: IncredibleEmployment.be  This test is not yet approved or cleared by the [place] FDA and has been authorized for detection and/or diagnosis of SARS-CoV-2 by FDA under an Emergency Use Authorization (EUA). This EUA will remain in effect (meaning this test can be used) for the duration of the COVID-19 declaration under Section 564(b)(1) of the Act, 21 U.S.C. section 360bbb-3(b)(1), unless the authorization is terminated or revoked.  Performed at [facility], [place] [address] [place], [place] [address]   Blood culture (routine x 2)     Status: None (Preliminary result)   Collection Time: 04/12/20  7:00 PM   Specimen: BLOOD LEFT ARM  Result Value Ref Range Status   Specimen Description BLOOD LEFT ARM  Final   Special Requests   Final    BOTTLES DRAWN AEROBIC AND ANAEROBIC Blood Culture adequate volume   Culture   Final    NO GROWTH < 24 HOURS Performed at [facility], [place] [address] [place],  [address]    Report Status PENDING  Incomplete         Radiology Studies: CT Angio Chest PE W and/or Wo Contrast  Result Date: 04/13/2020 CLINICAL DATA:  75 year old female with hypoxia.  EXAM: CT ANGIOGRAPHY CHEST WITH CONTRAST TECHNIQUE: Multidetector CT imaging of the chest was performed using the standard protocol during bolus administration of intravenous contrast. Multiplanar CT image reconstructions and MIPs were obtained to evaluate the vascular anatomy. CONTRAST:  70mL OMNIPAQUE IOHEXOL 350 MG/ML SOLN COMPARISON:  Chest CT dated 07/03/2019. FINDINGS: Evaluation is limited due to streak artifact caused by patient's arms. Cardiovascular: There is no cardiomegaly or pericardial effusion. There is coronary vascular calcification of the LAD. Moderate atherosclerotic calcification of the thoracic aorta. No aneurysmal dilatation. There is dilatation of the main pulmonary trunk suggestive of pulmonary hypertension. No pulmonary artery embolus identified. Mediastinum/Nodes: Enlarged hilar and mediastinal lymph nodes measuring 17 mm in the right hilum, and 15 mm in short axis in the prevascular space. The esophagus and the thyroid gland are grossly unremarkable. No mediastinal fluid collection. Lungs/Pleura: Background of emphysema. There is consolidative changes of the majority of the left lower lobe with overall volume loss. There is high-grade narrowing of the left lower lobe bronchus which may be related to mucous impaction/aspiration. A centrally occlusive lesion is not excluded. There is diffuse peribronchial thickening. There is progression of peribronchial thickening of the right lower lobe/right infrahilar region with narrowing of the right lower lobe bronchus. Scattered nodularity in the left upper lobe and lingula may be chronic or represent atypical infection. Clinical correlation and follow-up to resolution recommended. Small left pleural effusion. No pneumothorax. Upper Abdomen: No acute abnormality. Musculoskeletal: Osteopenia with multilevel chronic appearing compression fracture and thoracic kyphosis. No acute osseous pathology. Review of the MIP images confirms the above findings.  IMPRESSION: 1. No CT evidence of pulmonary artery embolus. 2. Interval progression of peribronchial thickening of the right lower lobe/right infrahilar region with narrowing of the right lower lobe bronchus. 3. Consolidative changes of the majority of the left lower lobe concerning for postobstructive atelectasis or infiltrate. There is high-grade narrowing of the left lower lobe bronchus which may be related to mucous impaction/aspiration. A centrally occlusive lesion is not excluded. Clinical correlation and follow-up to resolution recommended. 4. Scattered nodularity in the left upper lobe and lingula may represent atypical infection. Clinical correlation and follow-up  to resolution recommended. 5. Small left pleural effusion. 6. Mediastinal and hilar adenopathy. 7. Aortic Atherosclerosis (ICD10-I70.0) and Emphysema (ICD10-J43.9). Electronically Signed   By: Anner Crete M.D.   On: 04/13/2020 00:00   DG Chest Portable 1 View  Result Date: 04/12/2020 CLINICAL DATA:  Shortness of breath EXAM: PORTABLE CHEST 1 VIEW COMPARISON:  07/04/2019 FINDINGS: Heart is normal size. Volume loss on the left. Extensive airspace disease throughout the left lung concerning for pneumonia. No confluent opacity on the right. Scarring in the right base. No effusions. IMPRESSION: Volume loss and airspace disease throughout the left lung concerning for pneumonia. Electronically Signed   By: Rolm Baptise M.D.   On: 04/12/2020 19:09        Scheduled Meds: . docusate sodium  100 mg Oral BID  . DULoxetine  60 mg Oral Daily  . enoxaparin (LOVENOX) injection  40 mg Subcutaneous Q24H  . famotidine  20 mg Oral Daily  . feeding supplement  1 Container Oral BID  . mometasone-formoterol  2 puff Inhalation BID  . multivitamin with minerals  1 tablet Oral Daily  . olopatadine  1 drop Both Eyes BID  . pravastatin  40 mg Oral q1800  . sodium chloride HYPERTONIC  4 mL Nebulization Daily   Continuous Infusions: . sodium  chloride 75 mL/hr at 04/13/20 1427  . ceFEPime (MAXIPIME) IV Stopped (04/13/20 1001)  . vancomycin       LOS: 0 days    Time spent: 35 min    Nicolette Bang, MD [facility]  If 7PM-7AM, please contact night-coverage  04/13/2020, 3:19 PM

## 2020-04-13 NOTE — ED Notes (Signed)
Patent placed on right side per order and for comfort, tolerating well.

## 2020-04-13 NOTE — Progress Notes (Signed)
Sepsis tracking by eLINK 

## 2020-04-14 ENCOUNTER — Inpatient Hospital Stay (HOSPITAL_COMMUNITY): Payer: Medicare Other

## 2020-04-14 DIAGNOSIS — L8990 Pressure ulcer of unspecified site, unspecified stage: Secondary | ICD-10-CM | POA: Insufficient documentation

## 2020-04-14 LAB — CBC WITH DIFFERENTIAL/PLATELET
Abs Immature Granulocytes: 0.05 10*3/uL (ref 0.00–0.07)
Basophils Absolute: 0 10*3/uL (ref 0.0–0.1)
Basophils Relative: 0 %
Eosinophils Absolute: 0 10*3/uL (ref 0.0–0.5)
Eosinophils Relative: 0 %
HCT: 32.9 % — ABNORMAL LOW (ref 36.0–46.0)
Hemoglobin: 11.1 g/dL — ABNORMAL LOW (ref 12.0–15.0)
Immature Granulocytes: 0 %
Lymphocytes Relative: 6 %
Lymphs Abs: 0.8 10*3/uL (ref 0.7–4.0)
MCH: 29.1 pg (ref 26.0–34.0)
MCHC: 33.7 g/dL (ref 30.0–36.0)
MCV: 86.1 fL (ref 80.0–100.0)
Monocytes Absolute: 0.9 10*3/uL (ref 0.1–1.0)
Monocytes Relative: 6 %
Neutro Abs: 12.8 10*3/uL — ABNORMAL HIGH (ref 1.7–7.7)
Neutrophils Relative %: 88 %
Platelets: 249 10*3/uL (ref 150–400)
RBC: 3.82 MIL/uL — ABNORMAL LOW (ref 3.87–5.11)
RDW: 13.9 % (ref 11.5–15.5)
WBC: 14.6 10*3/uL — ABNORMAL HIGH (ref 4.0–10.5)
nRBC: 0 % (ref 0.0–0.2)

## 2020-04-14 LAB — MAGNESIUM: Magnesium: 2 mg/dL (ref 1.7–2.4)

## 2020-04-14 LAB — BASIC METABOLIC PANEL
Anion gap: 11 (ref 5–15)
BUN: 9 mg/dL (ref 8–23)
CO2: 23 mmol/L (ref 22–32)
Calcium: 8.9 mg/dL (ref 8.9–10.3)
Chloride: 98 mmol/L (ref 98–111)
Creatinine, Ser: 0.47 mg/dL (ref 0.44–1.00)
GFR, Estimated: >60 mL/min (ref >60)
Glucose, Bld: 141 mg/dL — ABNORMAL HIGH (ref 70–99)
Potassium: 2.9 mmol/L — ABNORMAL LOW (ref 3.5–5.1)
Sodium: 132 mmol/L — ABNORMAL LOW (ref 135–145)

## 2020-04-14 MED ORDER — ONDANSETRON HCL 4 MG/2ML IJ SOLN
4.0000 mg | Freq: Four times a day (QID) | INTRAMUSCULAR | Status: DC | PRN
  Administered 2020-04-14 – 2020-04-15 (×2): 4 mg via INTRAVENOUS
  Filled 2020-04-14 (×2): qty 2

## 2020-04-14 MED ORDER — POTASSIUM CHLORIDE CRYS ER 20 MEQ PO TBCR
40.0000 meq | EXTENDED_RELEASE_TABLET | Freq: Once | ORAL | Status: AC
  Administered 2020-04-14: 40 meq via ORAL
  Filled 2020-04-14: qty 2

## 2020-04-14 MED ORDER — HYDROCODONE-HOMATROPINE 5-1.5 MG/5ML PO SYRP
5.0000 mL | ORAL_SOLUTION | ORAL | Status: DC | PRN
Start: 2020-04-14 — End: 2020-04-17

## 2020-04-14 MED ORDER — CYCLOBENZAPRINE HCL 10 MG PO TABS
5.0000 mg | ORAL_TABLET | Freq: Three times a day (TID) | ORAL | Status: DC | PRN
  Administered 2020-04-14 – 2020-04-16 (×3): 5 mg via ORAL
  Filled 2020-04-14 (×3): qty 1

## 2020-04-14 NOTE — Progress Notes (Signed)
Patient's oxygen saturations 84-85% on 8L high flow and 15L NRB. Patient repositioned and given a fan with no improvement in oxygenation. RN increased high flow to 10L. Pt o2 sats 88-90% on 10L high flow and 15L NRB.

## 2020-04-14 NOTE — Progress Notes (Signed)
PROGRESS NOTE    [name] Bell  NKN:[id number] DOB: 11/09/1945 DOA: 04/12/2020 PCP: Hendricks Limes, MD   Brief Narrative:  [name] Guadalajara Buckneris a 75 y.o.femalewith medical history significant ofhypertension, hyperlipidemia, legally blind, ischemic colitis, duodenal ulcer, small bowel perforation status post resection with residual colostomy, chronic pain, chronic diastolic CHF, COPD, chronic respiratory failure on 2 L home oxygen presenting to the ED from her nursing home for evaluation of shortness of breath. Reportedly sats as low as 60s at her nursing home and EMS placed nonrebreather. She was tachycardic with EMS. Patient reports 70-month history of shortness of breath, worse for the past 1 day. She is also coughing and cough is productive of dark yellow sputum. Denies chest pain or fevers. She is fully vaccinated against COVID including booster shot. States she uses 4 L home oxygen all the time. No other complaints   Assessment & Plan:   Principal Problem:   Acute hypoxemic respiratory failure (HCC) Active Problems:   Pneumonia   Severe sepsis (HCC)   Elevated troponin   CHF (congestive heart failure) (HCC)   Pressure injury of skin   Severe sepsis and acute on chronic hypoxemic respiratory failure secondary to pneumonia AS  PREV NOTED: -Reportedly sats were in the 60s at her nursing home and was placed on nonrebreather by EMS. In the ED, satting 80% on nonrebreather. Patient declined BiPAP AND DECLNED CHEST VEST OVERNIGHT.  -Meets criteria for sepsis due to tachycardia, tachypnea, leukocytosis, and mild lactic acidosis. -SARS-CoV-2 PCR test negative. Influenza panel negative. -CT angiogram chest negative for PE. Showing interval progression of peribronchial thickening of the right lower lobe/right infrahilar region with narrowing of the right lower lobe bronchus. Consolidative changes of the majority of the left lower lobe concerning for postobstructive atelectasis  or infiltrate. There is high-grade narrowing of the left lower lobe bronchus which may be related to mucus impaction/aspiration. Centrally occlusive lesion not excluded. Scattered nodularity in the left upper lobe and lingula may represent atypical infection. Small left pleural effusion. Mediastinal and hilar adenopathy. -PCCMMDfelt that the patient wouldnotbe able to tolerate bronchoscopy. He recommended aggressive pulmonary toilet including vibration (vest or bed), bronchodilators, saline nebs, laying on right side.RT has been consulted. -Continue antibiotics-vancomycin and cefepime.DAY 3 -BNP only mildly elevated, does not appear overtly volume overloaded on exam. Will give gentle IV fluid hydration as she continues to be tachycardic and blood pressure currently soft-THOUGH PT REPORTS TYPICALLY IS LOW. -Blood culture NTD -Continue to monitor WBC count -Most recent lactate 1,3  Troponin elevation:Likely due to demand ischemia. High-sensitivity troponin elevated but stable (189 >184). EKG without acute ischemic changes. Patient denies chest pain. -Cardiac monitoring  Chronic diastolic CHF -No significant elevation of BNP. Hold diuretics X 1 ADDITIONAL DAY. Giving gentle IV fluid hydration given severe sepsis and soft blood pressure. Monitor volume status very closely. Likely restart in AM if bp toelrates -check cxr in AM  Mild hyponatremia -132, Continue to monitor  COPD:No signs of acute exacerbation. -Continue bronchodilator treatments  Depression -Continue Cymbalta  GERD -Continue Pepcid  Hyperlipidemia -Continue statin  DVT prophylaxis: Lovenox SQ  Code Status: DNR    Code Status Orders  (From admission, onward)         Start     Ordered   04/13/20 0420  Do not attempt resuscitation (DNR)  Continuous       Question Answer Comment  In the event of cardiac or respiratory ARREST Do not call a "code blue"   In the  event of cardiac or  respiratory ARREST Do not perform Intubation, CPR, defibrillation or ACLS   In the event of cardiac or respiratory ARREST Use medication by any route, position, wound care, and other measures to relive pain and suffering. May use oxygen, suction and manual treatment of airway obstruction as needed for comfort.      04/13/20 0420        Code Status History    Date Active Date Inactive Code Status Order ID Comments User Context   04/13/2020 0042 04/13/2020 0420 DNR [id number]  Merrily Pew, MD ED   07/03/2019 1536 07/07/2019 1946 Full Code [id number]  Norval Morton, MD ED   04/19/2019 0513 04/25/2019 2004 Full Code [id number]  Lars Mage, MD ED   03/24/2018 0506 03/28/2018 1923 Full Code [id number]  Valinda Party, DO ED   07/02/2017 2309 07/06/2017 1955 Full Code [id number]  Jule Ser, DO ED   06/03/2017 2230 06/08/2017 1917 Full Code [id number]  Zada Finders, MD Inpatient   03/31/2016 2112 04/07/2016 1704 Full Code [id number]  Lily Kocher, MD ED   09/02/2015 1721 09/04/2015 2316 Full Code [id number]  Norman Herrlich, MD ED   10/11/2013 1918 10/13/2013 1927 Full Code [id number]  Cresenciano Genre Inpatient   09/29/2012 0528 10/02/2012 1828 Full Code [id number]  Clinton Gallant, MD Inpatient   07/30/2011 0425 08/11/2011 1529 Full Code [id number]  Johney Maine, Revonda Standard., MD ED   Advance Care Planning Activity     Family Communication: Tried calling friend, Ms [place], [place] Disposition Plan:    Status is: Inpatient  Remains inpatient appropriate because:Inpatient level of care appropriate due to severity of illness   Dispo: The patient is from:SNF Anticipated d/c is to:SNF Patient currently is not medically stable to d/c. Difficult to place patient No Consults called: None Admission status: Inpatient   Consultants:   None  Procedures:  CT Angio Chest PE W and/or Wo Contrast  Result Date: 04/13/2020 CLINICAL DATA:  75 year old female with hypoxia. EXAM: CT  ANGIOGRAPHY CHEST WITH CONTRAST TECHNIQUE: Multidetector CT imaging of the chest was performed using the standard protocol during bolus administration of intravenous contrast. Multiplanar CT image reconstructions and MIPs were obtained to evaluate the vascular anatomy. CONTRAST:  37mL OMNIPAQUE IOHEXOL 350 MG/ML SOLN COMPARISON:  Chest CT dated 07/03/2019. FINDINGS: Evaluation is limited due to streak artifact caused by patient's arms. Cardiovascular: There is no cardiomegaly or pericardial effusion. There is coronary vascular calcification of the LAD. Moderate atherosclerotic calcification of the thoracic aorta. No aneurysmal dilatation. There is dilatation of the main pulmonary trunk suggestive of pulmonary hypertension. No pulmonary artery embolus identified. Mediastinum/Nodes: Enlarged hilar and mediastinal lymph nodes measuring 17 mm in the right hilum, and 15 mm in short axis in the prevascular space. The esophagus and the thyroid gland are grossly unremarkable. No mediastinal fluid collection. Lungs/Pleura: Background of emphysema. There is consolidative changes of the majority of the left lower lobe with overall volume loss. There is high-grade narrowing of the left lower lobe bronchus which may be related to mucous impaction/aspiration. A centrally occlusive lesion is not excluded. There is diffuse peribronchial thickening. There is progression of peribronchial thickening of the right lower lobe/right infrahilar region with narrowing of the right lower lobe bronchus. Scattered nodularity in the left upper lobe and lingula may be chronic or represent atypical infection. Clinical correlation and follow-up to resolution recommended. Small left pleural effusion. No pneumothorax. Upper Abdomen: No acute abnormality. Musculoskeletal: Osteopenia with multilevel chronic appearing compression fracture and thoracic  kyphosis. No acute osseous pathology. Review of the MIP images confirms the above findings. IMPRESSION:  1. No CT evidence of pulmonary artery embolus. 2. Interval progression of peribronchial thickening of the right lower lobe/right infrahilar region with narrowing of the right lower lobe bronchus. 3. Consolidative changes of the majority of the left lower lobe concerning for postobstructive atelectasis or infiltrate. There is high-grade narrowing of the left lower lobe bronchus which may be related to mucous impaction/aspiration. A centrally occlusive lesion is not excluded. Clinical correlation and follow-up to resolution recommended. 4. Scattered nodularity in the left upper lobe and lingula may represent atypical infection. Clinical correlation and follow-up to resolution recommended. 5. Small left pleural effusion. 6. Mediastinal and hilar adenopathy. 7. Aortic Atherosclerosis (ICD10-I70.0) and Emphysema (ICD10-J43.9). Electronically Signed   By: Anner Crete M.D.   On: 04/13/2020 00:00   DG CHEST PORT 1 VIEW  Result Date: 04/14/2020 CLINICAL DATA:  Evaluate for pneumonia. EXAM: PORTABLE CHEST 1 VIEW COMPARISON:  April 12, 2020 FINDINGS: Volume loss and airspace opacities are identified throughout the left lung slightly worse compared prior exam. The right lung is clear. The mediastinal contour and cardiac silhouette are stable. Osseous structures are stable. IMPRESSION: Volume loss and airspace opacities identified throughout the left lung slightly worse compared prior exam. Electronically Signed   By: Abelardo Diesel M.D.   On: 04/14/2020 11:25   DG Chest Portable 1 View  Result Date: 04/12/2020 CLINICAL DATA:  Shortness of breath EXAM: PORTABLE CHEST 1 VIEW COMPARISON:  07/04/2019 FINDINGS: Heart is normal size. Volume loss on the left. Extensive airspace disease throughout the left lung concerning for pneumonia. No confluent opacity on the right. Scarring in the right base. No effusions. IMPRESSION: Volume loss and airspace disease throughout the left lung concerning for pneumonia. Electronically  Signed   By: Rolm Baptise M.D.   On: 04/12/2020 19:09     Antimicrobials:   VANC/CEFEPIME DAY 3    Subjective: Patient much more awake alert and answering questions appropriately today Clinically less short of breath on evaluation  Objective: Vitals:   04/14/20 0401 04/14/20 0734 04/14/20 0755 04/14/20 1138  BP: (!) 113/57  (!) 109/43 (!) 98/47  Pulse: (!) 113  (!) 103 (!) 109  Resp: (!) 26  20 20   Temp: 99.7 F (37.6 C)  97.9 F (36.6 C) 98.2 F (36.8 C)  TempSrc: Oral  Axillary Oral  SpO2: 100% 92%  95%  Weight:      Height:        Intake/Output Summary (Last 24 hours) at 04/14/2020 1242 Last data filed at 04/13/2020 2245 Gross per 24 hour  Intake 300 ml  Output 550 ml  Net -250 ml   Filed Weights   04/13/20 0400  Weight: 59 kg    Examination:  General exam: Appears calm and comfortable  Respiratory system: Rhonchi bilaterally, no accessory muscle use. Cardiovascular system: Mild tachycardia no murmurs noted gastrointestinal system: Abdomen is nondistended, soft and nontender. No organomegaly or masses felt. Normal bowel sounds heard. Central nervous system: Alert and oriented. No focal neurological deficits. Extremities: Warm well perfused, neurovascular intact. Skin: No rashes, lesions or ulcers Psychiatry: Judgement and insight are poor.     Data Reviewed: I have personally reviewed following labs and imaging studies  CBC: Recent Labs  Lab 04/12/20 1806 04/12/20 1956 04/13/20 0035 04/13/20 0448 04/14/20 0101  WBC 19.1*  --   --  18.7* 14.6*  NEUTROABS 16.9*  --   --   --  12.8*  HGB 13.6 14.3 12.9 12.5 11.1*  HCT 40.4 42.0 38.0 37.4 32.9*  MCV 86.9  --   --  86.8 86.1  PLT 350  --   --  271 096   Basic Metabolic Panel: Recent Labs  Lab 04/12/20 1956 04/12/20 2015 04/13/20 0035 04/14/20 0101  NA 128* 130* 131* 132*  K 6.5* 3.7 3.3* 2.9*  CL  --  91*  --  98  CO2  --  27  --  23  GLUCOSE  --  147*  --  141*  BUN  --  21  --  9   CREATININE  --  0.85  --  0.47  CALCIUM  --  8.6*  --  8.9  MG  --   --   --  2.0   GFR: Estimated Creatinine Clearance: 51.7 mL/min (by C-G formula based on SCr of 0.47 mg/dL). Liver Function Tests: Recent Labs  Lab 04/12/20 2015  AST 34  ALT 26  ALKPHOS 100  BILITOT 1.1  PROT 6.6  ALBUMIN 2.8*   Recent Labs  Lab 04/12/20 2015  LIPASE 23   No results for input(s): AMMONIA in the last 168 hours. Coagulation Profile: No results for input(s): INR, PROTIME in the last 168 hours. Cardiac Enzymes: No results for input(s): CKTOTAL, CKMB, CKMBINDEX, TROPONINI in the last 168 hours. BNP (last 3 results) No results for input(s): PROBNP in the last 8760 hours. HbA1C: No results for input(s): HGBA1C in the last 72 hours. CBG: No results for input(s): GLUCAP in the last 168 hours. Lipid Profile: No results for input(s): CHOL, HDL, LDLCALC, TRIG, CHOLHDL, LDLDIRECT in the last 72 hours. Thyroid Function Tests: No results for input(s): TSH, T4TOTAL, FREET4, T3FREE, THYROIDAB in the last 72 hours. Anemia Panel: No results for input(s): VITAMINB12, FOLATE, FERRITIN, TIBC, IRON, RETICCTPCT in the last 72 hours. Sepsis Labs: Recent Labs  Lab 04/12/20 1807 04/13/20 1306  LATICACIDVEN 2.0* 1.3    Recent Results (from the past 240 hour(s))  Resp Panel by RT-PCR (Flu A&B, Covid) Nasopharyngeal Swab     Status: None   Collection Time: 04/12/20  6:14 PM   Specimen: Nasopharyngeal Swab; Nasopharyngeal(NP) swabs in vial transport medium  Result Value Ref Range Status   SARS Coronavirus 2 by RT PCR NEGATIVE NEGATIVE Final    Comment: (NOTE) SARS-CoV-2 target nucleic acids are NOT DETECTED.  The SARS-CoV-2 RNA is generally detectable in upper respiratory specimens during the acute phase of infection. The lowest concentration of SARS-CoV-2 viral copies this assay can detect is 138 copies/mL. A negative result does not preclude SARS-Cov-2 infection and should not be used as the sole  basis for treatment or other patient management decisions. A negative result may occur with  improper specimen collection/handling, submission of specimen other than nasopharyngeal swab, presence of viral mutation(s) within the areas targeted by this assay, and inadequate number of viral copies(<138 copies/mL). A negative result must be combined with clinical observations, patient history, and epidemiological information. The expected result is Negative.  Fact Sheet for Patients:  EntrepreneurPulse.com.au  Fact Sheet for Healthcare Providers:  IncredibleEmployment.be  This test is no t yet approved or cleared by the [place] FDA and  has been authorized for detection and/or diagnosis of SARS-CoV-2 by FDA under an Emergency Use Authorization (EUA). This EUA will remain  in effect (meaning this test can be used) for the duration of the COVID-19 declaration under Section 564(b)(1) of the Act, 21 U.S.C.section 360bbb-3(b)(1), unless the authorization  is terminated  or revoked sooner.       Influenza A by PCR NEGATIVE NEGATIVE Final   Influenza B by PCR NEGATIVE NEGATIVE Final    Comment: (NOTE) The Xpert Xpress SARS-CoV-2/FLU/RSV plus assay is intended as an aid in the diagnosis of influenza from Nasopharyngeal swab specimens and should not be used as a sole basis for treatment. Nasal washings and aspirates are unacceptable for Xpert Xpress SARS-CoV-2/FLU/RSV testing.  Fact Sheet for Patients: EntrepreneurPulse.com.au  Fact Sheet for Healthcare Providers: IncredibleEmployment.be  This test is not yet approved or cleared by the [place] FDA and has been authorized for detection and/or diagnosis of SARS-CoV-2 by FDA under an Emergency Use Authorization (EUA). This EUA will remain in effect (meaning this test can be used) for the duration of the COVID-19 declaration under Section 564(b)(1) of the Act,  21 U.S.C. section 360bbb-3(b)(1), unless the authorization is terminated or revoked.  Performed at [facility], [place] [address], [place], [place] [address]   Blood culture (routine x 2)     Status: None (Preliminary result)   Collection Time: 04/12/20  7:00 PM   Specimen: BLOOD LEFT ARM  Result Value Ref Range Status   Specimen Description BLOOD LEFT ARM  Final   Special Requests   Final    BOTTLES DRAWN AEROBIC AND ANAEROBIC Blood Culture adequate volume   Culture   Final    NO GROWTH < 24 HOURS Performed at [facility], [place] [address], [place], [place] [address]    Report Status PENDING  Incomplete         Radiology Studies: CT Angio Chest PE W and/or Wo Contrast  Result Date: 04/13/2020 CLINICAL DATA:  75 year old female with hypoxia. EXAM: CT ANGIOGRAPHY CHEST WITH CONTRAST TECHNIQUE: Multidetector CT imaging of the chest was performed using the standard protocol during bolus administration of intravenous contrast. Multiplanar CT image reconstructions and MIPs were obtained to evaluate the vascular anatomy. CONTRAST:  30mL OMNIPAQUE IOHEXOL 350 MG/ML SOLN COMPARISON:  Chest CT dated 07/03/2019. FINDINGS: Evaluation is limited due to streak artifact caused by patient's arms. Cardiovascular: There is no cardiomegaly or pericardial effusion. There is coronary vascular calcification of the LAD. Moderate atherosclerotic calcification of the thoracic aorta. No aneurysmal dilatation. There is dilatation of the main pulmonary trunk suggestive of pulmonary hypertension. No pulmonary artery embolus identified. Mediastinum/Nodes: Enlarged hilar and mediastinal lymph nodes measuring 17 mm in the right hilum, and 15 mm in short axis in the prevascular space. The esophagus and the thyroid gland are grossly unremarkable. No mediastinal fluid collection. Lungs/Pleura: Background of emphysema. There is consolidative changes of the majority of the left lower lobe with overall volume loss.  There is high-grade narrowing of the left lower lobe bronchus which may be related to mucous impaction/aspiration. A centrally occlusive lesion is not excluded. There is diffuse peribronchial thickening. There is progression of peribronchial thickening of the right lower lobe/right infrahilar region with narrowing of the right lower lobe bronchus. Scattered nodularity in the left upper lobe and lingula may be chronic or represent atypical infection. Clinical correlation and follow-up to resolution recommended. Small left pleural effusion. No pneumothorax. Upper Abdomen: No acute abnormality. Musculoskeletal: Osteopenia with multilevel chronic appearing compression fracture and thoracic kyphosis. No acute osseous pathology. Review of the MIP images confirms the above findings. IMPRESSION: 1. No CT evidence of pulmonary artery embolus. 2. Interval progression of peribronchial thickening of the right lower lobe/right infrahilar region with narrowing of the right lower lobe  bronchus. 3. Consolidative changes of the majority of the left lower lobe concerning for postobstructive atelectasis or infiltrate. There is high-grade narrowing of the left lower lobe bronchus which may be related to mucous impaction/aspiration. A centrally occlusive lesion is not excluded. Clinical correlation and follow-up to resolution recommended. 4. Scattered nodularity in the left upper lobe and lingula may represent atypical infection. Clinical correlation and follow-up to resolution recommended. 5. Small left pleural effusion. 6. Mediastinal and hilar adenopathy. 7. Aortic Atherosclerosis (ICD10-I70.0) and Emphysema (ICD10-J43.9). Electronically Signed   By: Anner Crete M.D.   On: 04/13/2020 00:00   DG CHEST PORT 1 VIEW  Result Date: 04/14/2020 CLINICAL DATA:  Evaluate for pneumonia. EXAM: PORTABLE CHEST 1 VIEW COMPARISON:  April 12, 2020 FINDINGS: Volume loss and airspace opacities are identified throughout the left lung slightly  worse compared prior exam. The right lung is clear. The mediastinal contour and cardiac silhouette are stable. Osseous structures are stable. IMPRESSION: Volume loss and airspace opacities identified throughout the left lung slightly worse compared prior exam. Electronically Signed   By: Abelardo Diesel M.D.   On: 04/14/2020 11:25   DG Chest Portable 1 View  Result Date: 04/12/2020 CLINICAL DATA:  Shortness of breath EXAM: PORTABLE CHEST 1 VIEW COMPARISON:  07/04/2019 FINDINGS: Heart is normal size. Volume loss on the left. Extensive airspace disease throughout the left lung concerning for pneumonia. No confluent opacity on the right. Scarring in the right base. No effusions. IMPRESSION: Volume loss and airspace disease throughout the left lung concerning for pneumonia. Electronically Signed   By: Rolm Baptise M.D.   On: 04/12/2020 19:09        Scheduled Meds: . docusate sodium  100 mg Oral BID  . DULoxetine  60 mg Oral Daily  . enoxaparin (LOVENOX) injection  40 mg Subcutaneous Q24H  . famotidine  20 mg Oral Daily  . feeding supplement  1 Container Oral BID  . mometasone-formoterol  2 puff Inhalation BID  . multivitamin with minerals  1 tablet Oral Daily  . olopatadine  1 drop Both Eyes BID  . pravastatin  40 mg Oral q1800  . sodium chloride HYPERTONIC  4 mL Nebulization Daily   Continuous Infusions: . ceFEPime (MAXIPIME) IV 2 g (04/14/20 1059)  . vancomycin Stopped (04/13/20 2222)     LOS: 1 day    Time spent: [place], MD [facility]  If 7PM-7AM, please contact night-coverage  04/14/2020, 12:42 PM

## 2020-04-14 NOTE — Progress Notes (Signed)
Pt refuses Chest vest at this time. Flutter administered.

## 2020-04-14 NOTE — Plan of Care (Signed)
  Problem: Education: Goal: Knowledge of General Education information will improve Description: Including pain rating scale, medication(s)/side effects and non-pharmacologic comfort measures Outcome: Progressing   Problem: Health Behavior/Discharge Planning: Goal: Ability to manage health-related needs will improve Outcome: Not Progressing   Problem: Clinical Measurements: Goal: Ability to maintain clinical measurements within normal limits will improve Outcome: Not Progressing Goal: Will remain free from infection Outcome: Progressing Goal: Diagnostic test results will improve Outcome: Not Progressing Goal: Respiratory complications will improve Outcome: Progressing Goal: Cardiovascular complication will be avoided Outcome: Not Progressing   Problem: Nutrition: Goal: Adequate nutrition will be maintained Outcome: Progressing   Problem: Coping: Goal: Level of anxiety will decrease Outcome: Progressing   Problem: Elimination: Goal: Will not experience complications related to bowel motility Outcome: Progressing Goal: Will not experience complications related to urinary retention Outcome: Progressing   Problem: Pain Managment: Goal: General experience of comfort will improve Outcome: Progressing

## 2020-04-15 LAB — CBC WITH DIFFERENTIAL/PLATELET
Abs Immature Granulocytes: 0.04 10*3/uL (ref 0.00–0.07)
Basophils Absolute: 0 10*3/uL (ref 0.0–0.1)
Basophils Relative: 0 %
Eosinophils Absolute: 0 10*3/uL (ref 0.0–0.5)
Eosinophils Relative: 0 %
HCT: 34.7 % — ABNORMAL LOW (ref 36.0–46.0)
Hemoglobin: 11.1 g/dL — ABNORMAL LOW (ref 12.0–15.0)
Immature Granulocytes: 0 %
Lymphocytes Relative: 10 %
Lymphs Abs: 1.1 10*3/uL (ref 0.7–4.0)
MCH: 28.2 pg (ref 26.0–34.0)
MCHC: 32 g/dL (ref 30.0–36.0)
MCV: 88.3 fL (ref 80.0–100.0)
Monocytes Absolute: 0.8 10*3/uL (ref 0.1–1.0)
Monocytes Relative: 7 %
Neutro Abs: 9.1 10*3/uL — ABNORMAL HIGH (ref 1.7–7.7)
Neutrophils Relative %: 83 %
Platelets: 280 10*3/uL (ref 150–400)
RBC: 3.93 MIL/uL (ref 3.87–5.11)
RDW: 14 % (ref 11.5–15.5)
WBC: 11.1 10*3/uL — ABNORMAL HIGH (ref 4.0–10.5)
nRBC: 0 % (ref 0.0–0.2)

## 2020-04-15 LAB — BASIC METABOLIC PANEL
Anion gap: 7 (ref 5–15)
BUN: 7 mg/dL — ABNORMAL LOW (ref 8–23)
CO2: 25 mmol/L (ref 22–32)
Calcium: 8.9 mg/dL (ref 8.9–10.3)
Chloride: 103 mmol/L (ref 98–111)
Creatinine, Ser: 0.55 mg/dL (ref 0.44–1.00)
GFR, Estimated: >60 mL/min (ref >60)
Glucose, Bld: 120 mg/dL — ABNORMAL HIGH (ref 70–99)
Potassium: 3.4 mmol/L — ABNORMAL LOW (ref 3.5–5.1)
Sodium: 135 mmol/L (ref 135–145)

## 2020-04-15 LAB — MRSA PCR SCREENING: MRSA by PCR: POSITIVE — AB

## 2020-04-15 LAB — PROCALCITONIN: Procalcitonin: 0.23 ng/mL

## 2020-04-15 MED ORDER — POTASSIUM CHLORIDE CRYS ER 20 MEQ PO TBCR
40.0000 meq | EXTENDED_RELEASE_TABLET | Freq: Once | ORAL | Status: AC
  Administered 2020-04-15: 40 meq via ORAL
  Filled 2020-04-15: qty 2

## 2020-04-15 MED ORDER — MUPIROCIN 2 % EX OINT
1.0000 "application " | TOPICAL_OINTMENT | Freq: Two times a day (BID) | CUTANEOUS | Status: DC
  Administered 2020-04-15 – 2020-04-16 (×4): 1 via NASAL
  Filled 2020-04-15 (×2): qty 22

## 2020-04-15 MED ORDER — CHLORHEXIDINE GLUCONATE CLOTH 2 % EX PADS
6.0000 | MEDICATED_PAD | Freq: Every day | CUTANEOUS | Status: DC
  Administered 2020-04-16: 6 via TOPICAL

## 2020-04-15 NOTE — Progress Notes (Signed)
Pharmacy Antibiotic Note  [name] Bell is a 75 y.o. female admitted on 04/12/2020 with pneumonia.  Continues on Vancomycin and Cefepime - Day 4  SCr wnl, WBC trending down, high oxygen requirements MRSA PCR pending Cultures negative to date  Plan: -Continue Cefepime 2 gm IV Q 12 hours  -Continue Vancomycin 1000 mg IV Q 24 hrs. Goal AUC 400-550. Expected AUC: 517 SCr used: 0.85 -Monitor CBC, renal fx, cultures and clinical progress -Vanc levels as indicated    Height: 5\' 4"  (162.6 cm) Weight: 59 kg (130 lb) IBW/kg (Calculated) : 54.7  Temp (24hrs), Avg:98.6 F (37 C), Min:98.2 F (36.8 C), Max:98.9 F (37.2 C)  Recent Labs  Lab 04/12/20 1806 04/12/20 1807 04/12/20 2015 04/13/20 0448 04/13/20 1306 04/14/20 0101 04/15/20 0231  WBC 19.1*  --   --  18.7*  --  14.6* 11.1*  CREATININE  --   --  0.85  --   --  0.47 0.55  LATICACIDVEN  --  2.0*  --   --  1.3  --   --     Estimated Creatinine Clearance: 51.7 mL/min (by C-G formula based on SCr of 0.55 mg/dL).    Allergies  Allergen Reactions  . Strawberry (Diagnostic) Rash  . Duloxetine Nausea And Vomiting  . Ibuprofen Other (See Comments)    REACTION: Bleeding ulcers  . Nsaids Other (See Comments)    REACTION: Bleeding Ulcer  . Latex Itching    Antimicrobials this admission: Cefepime 3/18 >>  Vanc 3/18 >>   Thank you Anette Guarneri, PharmD Please refer to [facility] for unit-specific pharmacist

## 2020-04-15 NOTE — Progress Notes (Signed)
PROGRESS NOTE    [name] Bell  FGH:[id number] DOB: 1945/12/03 DOA: 04/12/2020 PCP: Hendricks Limes, MD   Brief Narrative:  [name] Bell a 75 y.o.femalewith medical history significant ofhypertension, hyperlipidemia, legally blind, ischemic colitis, duodenal ulcer, small bowel perforation status post resection with residual colostomy, chronic pain, chronic diastolic CHF, COPD, chronic respiratory failure on 2 L home oxygen presenting to the ED from her nursing home for evaluation of shortness of breath. Reportedly sats as low as 60s at her nursing home and EMS placed nonrebreather. She was tachycardic with EMS. Patient reports 49-month history of shortness of breath, worse for the past 1 day. She is also coughing and cough is productive of dark yellow sputum. Denies chest pain or fevers. She is fully vaccinated against COVID including booster shot. States she uses 4 L home oxygen all the time. No other complaints  Assessment & Plan:   Principal Problem:   Acute hypoxemic respiratory failure (HCC) Active Problems:   Pneumonia   Severe sepsis (HCC)   Elevated troponin   CHF (congestive heart failure) (HCC)   Pressure injury of skin   Severe sepsis and acute on chronic hypoxemic respiratory failure secondary to pneumonia AS PREV NOTED: -Reportedly sats were in the 60s at her nursing home and was placed on nonrebreather by EMS. In the ED, satting 80% on nonrebreather. Patient declined BiPAP AND DECLNED CHEST VEST OVERNIGHT.  -Meets criteria for sepsis due to tachycardia, tachypnea, leukocytosis, and mild lactic acidosis. -SARS-CoV-2 PCR test negative. Influenza panel negative. -CT angiogram chest negative for PE. Showing interval progression of peribronchial thickening of the right lower lobe/right infrahilar region with narrowing of the right lower lobe bronchus. Consolidative changes of the majority of the left lower lobe concerning for postobstructive atelectasis or  infiltrate. There is high-grade narrowing of the left lower lobe bronchus which may be related to mucus impaction/aspiration. Centrally occlusive lesion not excluded. Scattered nodularity in the left upper lobe and lingula may represent atypical infection. Small left pleural effusion. Mediastinal and hilar adenopathy. -PCCMMDfelt that the patient wouldnotbe able to tolerate bronchoscopy. He recommended aggressive pulmonary toilet including vibration (vest or bed), bronchodilators, saline nebs, laying on right side.RT has been consulted. -Continue antibiotics-vancomycin and cefepime.DAY 4 -BNP only mildly elevated, does not appear overtly volume overloaded on exam. Will give gentle IV fluid hydration as she continues to be tachycardic and blood pressure currently soft-THOUGH PT REPORTS TYPICALLY IS LOW. -Blood culture NTD -Continue to monitor WBC count -Most recent lactate 1,3  Troponin elevation:Likely due to demand ischemia. High-sensitivity troponin elevated but stable (189 >184). EKG without acute ischemic changes. Patient denies chest pain. -Cardiac monitoring  Chronic diastolic CHF -No significant elevation of BNP. Hold diureticsfor now. Giving gentle IV fluid hydration given severe sepsis and soft blood pressure. Monitor volume status very closely.Likely restart in AM if bp toelrates -check cxr in AM  Mild hyponatremia-resolved -135, Continue to monitor  COPD:No signs of acute exacerbation. -Continue bronchodilator treatments  Depression -Continue Cymbalta  GERD -Continue Pepcid  Hyperlipidemia -Continue statin   DVT prophylaxis:Lovenox Code Status: DNR Family Communication: None at bedside; pt will call Disposition Plan:  Status is: Inpatient  Remains inpatient appropriate because:IV treatments appropriate due to intensity of illness or inability to take PO and Inpatient level of care appropriate due to severity of illness   Dispo: The  patient is from: SNF              Anticipated d/c is to: SNF  Patient currently is not medically stable to d/c.   Difficult to place patient No   Skin Assessment:  I have examined the patient's skin and I agree with the wound assessment as performed by the wound care RN as outlined below:  Pressure Injury 05/01/19 Coccyx Mid Stage 1 -  Intact skin with non-blanchable redness of a localized area usually over a bony prominence. (Active)  05/01/19 1110  Location: Coccyx  Location Orientation: Mid  Staging: Stage 1 -  Intact skin with non-blanchable redness of a localized area usually over a bony prominence.  Wound Description (Comments):   Present on Admission:      Pressure Injury 04/13/20 Thigh Anterior;Left;Proximal Stage 1 -  Intact skin with non-blanchable redness of a localized area usually over a bony prominence. Moderate areas of non-blanchable redness correlating with zipper on colostomy appliance hitting h (Active)  04/13/20 1717  Location: Thigh  Location Orientation: Anterior;Left;Proximal  Staging: Stage 1 -  Intact skin with non-blanchable redness of a localized area usually over a bony prominence.  Wound Description (Comments): Moderate areas of non-blanchable redness correlating with zipper on colostomy appliance hitting her Left lateral hip/thigh area  Present on Admission: Yes    Consultants:   None  Procedures:   None  Antimicrobials:  Anti-infectives (From admission, onward)   Start     Dose/Rate Route Frequency Ordered Stop   04/13/20 2200  vancomycin (VANCOREADY) IVPB 1000 mg/200 mL        1,000 mg 200 mL/hr over 60 Minutes Intravenous Every 24 hours 04/12/20 2232     04/13/20 1000  ceFEPIme (MAXIPIME) 2 g in sodium chloride 0.9 % 100 mL IVPB        2 g 200 mL/hr over 30 Minutes Intravenous Every 12 hours 04/12/20 2232     04/12/20 1930  ceFEPIme (MAXIPIME) 2 g in sodium chloride 0.9 % 100 mL IVPB        2 g 200 mL/hr over 30 Minutes  Intravenous  Once 04/12/20 1925 04/12/20 2105   04/12/20 1930  vancomycin (VANCOREADY) IVPB 1250 mg/250 mL        1,250 mg 166.7 mL/hr over 90 Minutes Intravenous  Once 04/12/20 1925 04/12/20 2254       Subjective: Patient seen and evaluated today with no new acute complaints or concerns. No acute concerns or events noted overnight.  She appears to be less short of breath, but continues to have cough and congestion.  Objective: Vitals:   04/15/20 0510 04/15/20 0732 04/15/20 0737 04/15/20 0758  BP: 115/61   (!) 106/52  Pulse: (!) 113   (!) 112  Resp: (!) 24   19  Temp:    98.5 F (36.9 C)  TempSrc:    Oral  SpO2: 96% 96% 96% 96%  Weight:      Height:        Intake/Output Summary (Last 24 hours) at 04/15/2020 1105 Last data filed at 04/15/2020 0300 Gross per 24 hour  Intake 300 ml  Output 350 ml  Net -50 ml   Filed Weights   04/13/20 0400  Weight: 59 kg    Examination:  General exam: Appears calm and comfortable  Respiratory system: Clear to auscultation. Respiratory effort normal.  Currently on 5 L nasal cannula and nonrebreather Cardiovascular system: S1 & S2 heard, RRR.  Gastrointestinal system: Abdomen is soft Central nervous system: Alert and awake Extremities: No edema Skin: No significant lesions noted Psychiatry: Flat affect.    Data Reviewed: I have  personally reviewed following labs and imaging studies  CBC: Recent Labs  Lab 04/12/20 1806 04/12/20 1956 04/13/20 0035 04/13/20 0448 04/14/20 0101 04/15/20 0231  WBC 19.1*  --   --  18.7* 14.6* 11.1*  NEUTROABS 16.9*  --   --   --  12.8* 9.1*  HGB 13.6 14.3 12.9 12.5 11.1* 11.1*  HCT 40.4 42.0 38.0 37.4 32.9* 34.7*  MCV 86.9  --   --  86.8 86.1 88.3  PLT 350  --   --  271 249 726   Basic Metabolic Panel: Recent Labs  Lab 04/12/20 1956 04/12/20 2015 04/13/20 0035 04/14/20 0101 04/15/20 0231  NA 128* 130* 131* 132* 135  K 6.5* 3.7 3.3* 2.9* 3.4*  CL  --  91*  --  98 103  CO2  --  27  --   23 25  GLUCOSE  --  147*  --  141* 120*  BUN  --  21  --  9 7*  CREATININE  --  0.85  --  0.47 0.55  CALCIUM  --  8.6*  --  8.9 8.9  MG  --   --   --  2.0  --    GFR: Estimated Creatinine Clearance: 51.7 mL/min (by C-G formula based on SCr of 0.55 mg/dL). Liver Function Tests: Recent Labs  Lab 04/12/20 2015  AST 34  ALT 26  ALKPHOS 100  BILITOT 1.1  PROT 6.6  ALBUMIN 2.8*   Recent Labs  Lab 04/12/20 2015  LIPASE 23   No results for input(s): AMMONIA in the last 168 hours. Coagulation Profile: No results for input(s): INR, PROTIME in the last 168 hours. Cardiac Enzymes: No results for input(s): CKTOTAL, CKMB, CKMBINDEX, TROPONINI in the last 168 hours. BNP (last 3 results) No results for input(s): PROBNP in the last 8760 hours. HbA1C: No results for input(s): HGBA1C in the last 72 hours. CBG: No results for input(s): GLUCAP in the last 168 hours. Lipid Profile: No results for input(s): CHOL, HDL, LDLCALC, TRIG, CHOLHDL, LDLDIRECT in the last 72 hours. Thyroid Function Tests: No results for input(s): TSH, T4TOTAL, FREET4, T3FREE, THYROIDAB in the last 72 hours. Anemia Panel: No results for input(s): VITAMINB12, FOLATE, FERRITIN, TIBC, IRON, RETICCTPCT in the last 72 hours. Sepsis Labs: Recent Labs  Lab 04/12/20 1807 04/13/20 1306  LATICACIDVEN 2.0* 1.3    Recent Results (from the past 240 hour(s))  Resp Panel by RT-PCR (Flu A&B, Covid) Nasopharyngeal Swab     Status: None   Collection Time: 04/12/20  6:14 PM   Specimen: Nasopharyngeal Swab; Nasopharyngeal(NP) swabs in vial transport medium  Result Value Ref Range Status   SARS Coronavirus 2 by RT PCR NEGATIVE NEGATIVE Final    Comment: (NOTE) SARS-CoV-2 target nucleic acids are NOT DETECTED.  The SARS-CoV-2 RNA is generally detectable in upper respiratory specimens during the acute phase of infection. The lowest concentration of SARS-CoV-2 viral copies this assay can detect is 138 copies/mL. A negative  result does not preclude SARS-Cov-2 infection and should not be used as the sole basis for treatment or other patient management decisions. A negative result may occur with  improper specimen collection/handling, submission of specimen other than nasopharyngeal swab, presence of viral mutation(s) within the areas targeted by this assay, and inadequate number of viral copies(<138 copies/mL). A negative result must be combined with clinical observations, patient history, and epidemiological information. The expected result is Negative.  Fact Sheet for Patients:  EntrepreneurPulse.com.au  Fact Sheet for Healthcare Providers:  IncredibleEmployment.be  This test is no t yet approved or cleared by the [place] and  has been authorized for detection and/or diagnosis of SARS-CoV-2 by FDA under an Emergency Use Authorization (EUA). This EUA will remain  in effect (meaning this test can be used) for the duration of the COVID-19 declaration under Section 564(b)(1) of the Act, 21 U.S.C.section 360bbb-3(b)(1), unless the authorization is terminated  or revoked sooner.       Influenza A by PCR NEGATIVE NEGATIVE Final   Influenza B by PCR NEGATIVE NEGATIVE Final    Comment: (NOTE) The Xpert Xpress SARS-CoV-2/FLU/RSV plus assay is intended as an aid in the diagnosis of influenza from Nasopharyngeal swab specimens and should not be used as a sole basis for treatment. Nasal washings and aspirates are unacceptable for Xpert Xpress SARS-CoV-2/FLU/RSV testing.  Fact Sheet for Patients: EntrepreneurPulse.com.au  Fact Sheet for Healthcare Providers: IncredibleEmployment.be  This test is not yet approved or cleared by the [place] FDA and has been authorized for detection and/or diagnosis of SARS-CoV-2 by FDA under an Emergency Use Authorization (EUA). This EUA will remain in effect (meaning this test can be used)  for the duration of the COVID-19 declaration under Section 564(b)(1) of the Act, 21 U.S.C. section 360bbb-3(b)(1), unless the authorization is terminated or revoked.  Performed at [facility], [place] [address], [place], [place] [address]   Blood culture (routine x 2)     Status: None (Preliminary result)   Collection Time: 04/12/20  7:00 PM   Specimen: BLOOD LEFT ARM  Result Value Ref Range Status   Specimen Description BLOOD LEFT ARM  Final   Special Requests   Final    BOTTLES DRAWN AEROBIC AND ANAEROBIC Blood Culture adequate volume   Culture   Final    NO GROWTH 3 DAYS Performed at [facility], [place] [address], [place], [place] [address]    Report Status PENDING  Incomplete         Radiology Studies: DG CHEST PORT 1 VIEW  Result Date: 04/14/2020 CLINICAL DATA:  Evaluate for pneumonia. EXAM: PORTABLE CHEST 1 VIEW COMPARISON:  April 12, 2020 FINDINGS: Volume loss and airspace opacities are identified throughout the left lung slightly worse compared prior exam. The right lung is clear. The mediastinal contour and cardiac silhouette are stable. Osseous structures are stable. IMPRESSION: Volume loss and airspace opacities identified throughout the left lung slightly worse compared prior exam. Electronically Signed   By: Abelardo Diesel M.D.   On: 04/14/2020 11:25        Scheduled Meds: . docusate sodium  100 mg Oral BID  . DULoxetine  60 mg Oral Daily  . enoxaparin (LOVENOX) injection  40 mg Subcutaneous Q24H  . famotidine  20 mg Oral Daily  . feeding supplement  1 Container Oral BID  . mometasone-formoterol  2 puff Inhalation BID  . multivitamin with minerals  1 tablet Oral Daily  . olopatadine  1 drop Both Eyes BID  . potassium chloride  40 mEq Oral Once  . pravastatin  40 mg Oral q1800   Continuous Infusions: . ceFEPime (MAXIPIME) IV 2 g (04/15/20 0935)  . vancomycin Stopped (04/14/20 2346)     LOS: 2 days    Time spent: 35 minutes    Pratik D  Manuella Ghazi, DO [facility]  If 7PM-7AM, please contact night-coverage [email or web address] 04/15/2020, 11:05 AM

## 2020-04-16 ENCOUNTER — Inpatient Hospital Stay (HOSPITAL_COMMUNITY): Payer: Medicare Other

## 2020-04-16 LAB — PROCALCITONIN: Procalcitonin: 0.15 ng/mL

## 2020-04-16 LAB — CBC
HCT: 37.4 % (ref 36.0–46.0)
Hemoglobin: 11.9 g/dL — ABNORMAL LOW (ref 12.0–15.0)
MCH: 28.1 pg (ref 26.0–34.0)
MCHC: 31.8 g/dL (ref 30.0–36.0)
MCV: 88.4 fL (ref 80.0–100.0)
Platelets: 327 10*3/uL (ref 150–400)
RBC: 4.23 MIL/uL (ref 3.87–5.11)
RDW: 14.3 % (ref 11.5–15.5)
WBC: 9.3 10*3/uL (ref 4.0–10.5)
nRBC: 0 % (ref 0.0–0.2)

## 2020-04-16 LAB — BASIC METABOLIC PANEL
Anion gap: 9 (ref 5–15)
BUN: 7 mg/dL — ABNORMAL LOW (ref 8–23)
CO2: 23 mmol/L (ref 22–32)
Calcium: 8.8 mg/dL — ABNORMAL LOW (ref 8.9–10.3)
Chloride: 103 mmol/L (ref 98–111)
Creatinine, Ser: 0.52 mg/dL (ref 0.44–1.00)
GFR, Estimated: >60 mL/min (ref >60)
Glucose, Bld: 143 mg/dL — ABNORMAL HIGH (ref 70–99)
Potassium: 3.3 mmol/L — ABNORMAL LOW (ref 3.5–5.1)
Sodium: 135 mmol/L (ref 135–145)

## 2020-04-16 LAB — RESP PANEL BY RT-PCR (FLU A&B, COVID) ARPGX2
Influenza A by PCR: NEGATIVE
Influenza B by PCR: NEGATIVE
SARS Coronavirus 2 by RT PCR: NEGATIVE

## 2020-04-16 LAB — MAGNESIUM: Magnesium: 2 mg/dL (ref 1.7–2.4)

## 2020-04-16 LAB — BRAIN NATRIURETIC PEPTIDE: B Natriuretic Peptide: 790.1 pg/mL — ABNORMAL HIGH (ref 0.0–100.0)

## 2020-04-16 MED ORDER — POTASSIUM CHLORIDE CRYS ER 20 MEQ PO TBCR
40.0000 meq | EXTENDED_RELEASE_TABLET | Freq: Once | ORAL | Status: AC
  Administered 2020-04-16: 40 meq via ORAL
  Filled 2020-04-16: qty 2

## 2020-04-16 MED ORDER — METOPROLOL SUCCINATE ER 25 MG PO TB24
25.0000 mg | ORAL_TABLET | Freq: Every day | ORAL | Status: DC
  Administered 2020-04-16: 25 mg via ORAL
  Filled 2020-04-16: qty 1

## 2020-04-16 MED ORDER — MORPHINE SULFATE ER 15 MG PO TBCR: 15.0000 mg | EXTENDED_RELEASE_TABLET | Freq: Two times a day (BID) | ORAL | 0 refills | Status: AC | PRN

## 2020-04-16 MED ORDER — AMOXICILLIN-POT CLAVULANATE 875-125 MG PO TABS: 1.0000 | ORAL_TABLET | Freq: Two times a day (BID) | ORAL | 0 refills | Status: AC

## 2020-04-16 MED ORDER — CYCLOBENZAPRINE HCL 5 MG PO TABS: 5.0000 mg | ORAL_TABLET | Freq: Two times a day (BID) | ORAL | 0 refills | Status: DC | PRN

## 2020-04-16 NOTE — TOC Transition Note (Signed)
Transition of Care [facility]) - CM/SW Discharge Note   Patient Details  Name: MINKA KNIGHT MRN: [id number] Date of Birth: [birth year]  [facility] of [facility]) CM/SW Contact:  Joanne Chars, LCSW Phone Number: 04/16/2020, 2:41 PM   Clinical Narrative:   Pt discharging back to [facility] long term care.  RN call report to [phone].      Final next level of care: Long Term Nursing Home Barriers to Discharge: Barriers Resolved   Patient Goals and CMS Choice        Discharge Placement              Patient chooses bed at:  [facility]) Patient to be transferred to facility by: [place] Name of family member notified: unable to reach friend, Clinical cytogeneticist    Discharge Plan and Services                                     Social Determinants of Health (SDOH) Interventions     Readmission Risk Interventions Readmission Risk Prevention Plan 04/16/2020  Transportation Screening Complete  PCP or Specialist appointment within 3-5 days of discharge Not Complete  PCP/Specialist Appt Not Complete comments PCP services through [facility] or [place] Complete  SW Recovery Care/Counseling Consult Complete  [place] Complete  Some recent data might be hidden

## 2020-04-16 NOTE — NC FL2 (Signed)
[place] MEDICAID FL2 LEVEL OF CARE SCREENING TOOL     IDENTIFICATION  Patient Name: [name] Bell Birthdate: 04-May-1945 Sex: female Admission Date (Current Location): 04/12/2020  [place] and [place] Number:  Kathleen Argue [id number] [place] and Address:  [place]. [facility], [place] [address], [place], [place] [address]      Provider Number: [id number]  Attending Physician Name and Address:  Rodena Goldmann, DO  Relative Name and Phone Number:  Marty Heck   [phone]    Current Level of Care: Hospital Recommended Level of Care: Nursing Facility Prior Approval Number:    Date Approved/Denied:   PASRR Number: [id number]  Discharge Plan: Other (Comment) (Long Term care)    Current Diagnoses: Patient Active Problem List   Diagnosis Date Noted  . Pressure injury of skin 04/14/2020  . Acute hypoxemic respiratory failure ([place]) 04/13/2020  . Pneumonia 04/13/2020  . Severe sepsis ([place]) 04/13/2020  . Elevated troponin 04/13/2020  . CHF (congestive heart failure) ([place]) 04/13/2020  . Real time reverse transcriptase PCR positive for severe acute respiratory syndrome coronavirus 2 (SARS-CoV-2) 01/04/2020  . Hypotension 10/03/2019  . Chronic diastolic (congestive) heart failure ([place]) 07/11/2019  . Dyspnea 07/04/2019  . Pleural effusion 07/03/2019  . Chronic respiratory failure with hypoxia ([place]) 07/03/2019  . Opioid dependence with opioid-induced disorder ([place]) 05/11/2019  . FTT (failure to thrive) in adult 05/06/2019  . Symptomatic anemia 04/19/2019  . Frailty 04/19/2019  . External hemorrhoids without complication 02/72/5366  . Swelling of lower extremity 08/10/2018  . Pain due to onychomycosis of toenails of both feet 08/03/2018  . At risk for adverse drug reaction 03/29/2018  . Malnutrition of moderate degree 03/28/2018  . Duodenal ulcer disease 07/27/2017  . Hepatic steatosis 07/03/2017  . Aortic atherosclerosis ([place]) 06/04/2017  . Iron  deficiency anemia 06/04/2017  . History of ischemic colitis 06/03/2017  . S/P sigmoid colectomy 06/03/2017  . Back pain, chronic 08/14/2016  . Hypokalemia 03/31/2016  . Sinus tachycardia 09/11/2015  . Macular degeneration 01/14/2012  . COPD (chronic obstructive pulmonary disease) ([place]) 11/25/2011  . Colostomy present ([place]) 10/27/2011  . Hyperlipidemia 01/24/2009  . Long term (current) use of opiate analgesic 01/03/2009  . Major depression, chronic 03/08/2008  . Osteoporosis 03/03/2007    Orientation RESPIRATION BLADDER Height & Weight     Self,Time,Situation,Place  O2 Continent Weight: 130 lb (59 kg) Height:  5\' 4"  (162.6 cm)  BEHAVIORAL SYMPTOMS/MOOD NEUROLOGICAL BOWEL NUTRITION STATUS      Colostomy Diet (see discharge summary)  AMBULATORY STATUS COMMUNICATION OF NEEDS Skin   Total Care Verbally Other (Comment) (cellulitis)                       Personal Care Assistance Level of Assistance  Bathing,Feeding,Dressing Bathing Assistance: Maximum assistance Feeding assistance: Limited assistance Dressing Assistance: Maximum assistance     Functional Limitations Info  Sight,Hearing,Speech Sight Info: Impaired Hearing Info: Adequate Speech Info: Adequate    SPECIAL CARE FACTORS FREQUENCY                       Contractures Contractures Info: Not present    Additional Factors Info  Code Status,Allergies Code Status Info: DNR Allergies Info: Strawberry (Diagnostic), Duloxetine, Ibuprofen, Nsaids, Latex           Current Medications (04/16/2020):  This is the current hospital active medication list Current Facility-Administered Medications  Medication Dose Route Frequency Provider Last Rate Last Admin  . acetaminophen (TYLENOL)  tablet 650 mg  650 mg Oral Q6H PRN Shela Leff, MD       Or  . acetaminophen (TYLENOL) suppository 650 mg  650 mg Rectal Q6H PRN Shela Leff, MD      . albuterol (PROVENTIL) (2.5 MG/3ML) 0.083% nebulizer solution 2.5  mg  2.5 mg Nebulization Q4H PRN Shela Leff, MD   2.5 mg at 04/15/20 2308  . ceFEPIme (MAXIPIME) 2 g in sodium chloride 0.9 % 100 mL IVPB  2 g Intravenous Q12H Shela Leff, MD 200 mL/hr at 04/16/20 0853 2 g at 04/16/20 0853  . Chlorhexidine Gluconate Cloth 2 % PADS 6 each  6 each Topical Q0600 Heath Lark D, DO   6 each at 04/16/20 [phone]  . cyclobenzaprine (FLEXERIL) tablet 5 mg  5 mg Oral TID PRN Marcell Anger, MD   5 mg at 04/15/20 2255  . docusate sodium (COLACE) capsule 100 mg  100 mg Oral BID Shela Leff, MD   100 mg at 04/16/20 0840  . DULoxetine (CYMBALTA) DR capsule 60 mg  60 mg Oral Daily Shela Leff, MD   60 mg at 04/16/20 0840  . enoxaparin (LOVENOX) injection 40 mg  40 mg Subcutaneous Q24H Shela Leff, MD   40 mg at 04/15/20 1355  . famotidine (PEPCID) tablet 20 mg  20 mg Oral Daily Shela Leff, MD   20 mg at 04/16/20 0840  . feeding supplement (BOOST / RESOURCE BREEZE) liquid 1 Container  1 Container Oral BID Shela Leff, MD   1 Container at 04/16/20 [phone]  . HYDROcodone-acetaminophen (NORCO/VICODIN) 5-325 MG per tablet 1 tablet  1 tablet Oral Q6H PRN Marcell Anger, MD   1 tablet at 04/16/20 1252  . HYDROcodone-homatropine (HYCODAN) 5-1.5 MG/5ML syrup 5 mL  5 mL Oral Q4H PRN Spongberg, Audie Pinto, MD      . mometasone-formoterol [facility]) 200-5 MCG/ACT inhaler 2 puff  2 puff Inhalation BID Shela Leff, MD   2 puff at 04/16/20 0719  . multivitamin with minerals tablet 1 tablet  1 tablet Oral Daily Shela Leff, MD   1 tablet at 04/16/20 0840  . mupirocin ointment (BACTROBAN) 2 % 1 application  1 application Nasal BID Heath Lark D, DO   1 application at 37/90/24 [phone]  . olopatadine (PATANOL) 0.1 % ophthalmic solution 1 drop  1 drop Both Eyes BID Shela Leff, MD   1 drop at 04/16/20 0841  . ondansetron (ZOFRAN) injection 4 mg  4 mg Intravenous Q6H PRN Adefeso, Oladapo, DO   4 mg at 04/15/20 2254  .  pravastatin (PRAVACHOL) tablet 40 mg  40 mg Oral q1800 Shela Leff, MD   40 mg at 04/15/20 1739  . vancomycin (VANCOREADY) IVPB 1000 mg/200 mL  1,000 mg Intravenous Q24H Shela Leff, MD 200 mL/hr at 04/15/20 2342 1,000 mg at 04/15/20 2342     Discharge Medications: Please see discharge summary for a list of discharge medications.  Relevant Imaging Results:  Relevant Lab Results:   Additional Information SSN [id number]  Joanne Chars, LCSW

## 2020-04-16 NOTE — Progress Notes (Signed)
   04/16/20 0015  Assess: MEWS Score  Temp 98.5 F (36.9 C)  BP (!) 114/43  Pulse Rate (!) 117  Resp (!) 29  SpO2 100 %  O2 Device HFNC  Patient Activity (if Appropriate) In bed  O2 Flow Rate (L/min) 4 L/min  Assess: MEWS Score  MEWS Temp 0  MEWS Systolic 0  MEWS Pulse 2  MEWS RR 2  MEWS LOC 0  MEWS Score 4  MEWS Score Color Red  Assess: if the MEWS score is Yellow or Red  Were vital signs taken at a resting state? No  Focused Assessment No change from prior assessment  Early Detection of Sepsis Score *See Row Information* Medium  MEWS guidelines implemented *See Row Information* Yes  Treat  MEWS Interventions Administered prn meds/treatments  Take Vital Signs  Increase Vital Sign Frequency  Red: Q 1hr X 4 then Q 4hr X 4, if remains red, continue Q 4hrs  Escalate  MEWS: Escalate Red: discuss with charge nurse/RN and provider, consider discussing with RRT  Notify: Charge Nurse/RN  Date Charge Nurse/RN Notified 04/16/20  Time Charge Nurse/RN Notified 0116  Notify: Provider  Provider Name/Title Mansy, MD  Date Provider Notified 04/16/20  Time Provider Notified 0114  Notification Type Page  Notification Reason Other (Comment) (New red MEWS for current shift)  Provider response At bedside  Date of Provider Response 04/16/20  Time of Provider Response 0120  Document  Progress note created (see row info) Yes  MEWS red, new for current shift. No change in pt condition on assessment. Pt alert and oriented x 4, c/o anxiety. MD on call notified.  MD at the bedside assessed pt. Will administer pain med as requested by pt. Will continue to monitor pt closely.

## 2020-04-16 NOTE — Discharge Summary (Signed)
Physician Discharge Summary  [name] Bell:[id number] DOB: 03-09-1945 DOA: 04/12/2020  PCP: Hendricks Limes, MD  Admit date: 04/12/2020  Discharge date: 04/16/2020  Admitted From:SNF  Disposition:  SNF  Recommendations for Outpatient Follow-up:  1. Follow up with PCP in 1-2 weeks 2. Continue on Augmentin as prescribed for 3 more days to complete a total 7-day course of treatment 3. Continue other home medications as noted  Home Health:None  Equipment/Devices: Chronically wears 4 L nasal cannula at home.  Discharge Condition:Stable  CODE STATUS: DNR  Diet recommendation: Heart Healthy  Brief/Interim Summary: [name] Bell a 75 y.o.femalewith medical history significant ofhypertension, hyperlipidemia, legally blind, ischemic colitis, duodenal ulcer, small bowel perforation status post resection with residual colostomy, chronic pain, chronic diastolic CHF, COPD, chronic respiratory failure on 2 L home oxygen presenting to the ED from her nursing home for evaluation of shortness of breath. Reportedly sats as low as 60s at her nursing home and EMS placed nonrebreather. She was tachycardic with EMS. Patient reports 49-month history of shortness of breath, worse for the past 1 day. She is also coughing and cough is productive of dark yellow sputum. Denies chest pain or fevers. She is fully vaccinated against COVID including booster shot. States she uses 4 L home oxygen all the time. No other complaints.  -Patient was admitted with severe sepsis along with acute on chronic hypoxemic respiratory failure secondary to pneumonia.  She was empirically started on vancomycin and cefepime for treatment and MRSA PCR was positive.  Blood cultures with no growth to date and Covid testing as well as influenza panel were both negative.  CT chest angiogram was done to evaluate for PE due to the severity of hypoxemia and there is no finding of PE noted.  She is noted to have a high-grade  narrowing of her left lower lobe bronchus which could be related to mucous plugs and this was discussed with pulmonology who determined that she would not be a candidate for bronchoscopy at this time given her condition.  She is back to her usual baseline home oxygen of 4 L nasal cannula and states that she is no longer short of breath.  Her leukocytosis and procalcitonin levels have down trended with current treatment.  She has received 4 days of IV treatment while inpatient, and will continue on 3 more days of oral Augmentin as prescribed to complete the course for her pneumonia.  Blood pressure and heart rates are now further improved and she may resume her home diuretics with close monitoring.  No other acute events noted during the course of the stay.   Discharge Diagnoses:  Principal Problem:   Acute hypoxemic respiratory failure (HCC) Active Problems:   Pneumonia   Severe sepsis (HCC)   Elevated troponin   CHF (congestive heart failure) (HCC)   Pressure injury of skin  Principal discharge diagnosis: Severe sepsis present on admission secondary to pneumonia along with acute on chronic hypoxemic respiratory failure.  Discharge Instructions  Discharge Instructions    Diet - low sodium heart healthy   Complete by: As directed    Discharge wound care:   Complete by: As directed    Daily dressing changes.   Increase activity slowly   Complete by: As directed      Allergies as of 04/16/2020      Reactions   Strawberry (diagnostic) Rash   Duloxetine Nausea And Vomiting   Ibuprofen Other (See Comments)   REACTION: Bleeding ulcers   Nsaids Other (  See Comments)   REACTION: Bleeding Ulcer   Latex Itching      Medication List    STOP taking these medications   clindamycin 300 MG capsule Commonly known as: CLEOCIN     TAKE these medications   acetaminophen 650 MG CR tablet Commonly known as: TYLENOL Take 650 mg by mouth See admin instructions. Tid x 5 days   albuterol 108  (90 Base) MCG/ACT inhaler Commonly known as: VENTOLIN HFA TAKE 2 PUFFS BY MOUTH EVERY 6 HOURS AS NEEDED FOR WHEEZE OR SHORTNESS OF BREATH What changed: See the new instructions.   amoxicillin-clavulanate 875-125 MG tablet Commonly known as: Augmentin Take 1 tablet by mouth 2 (two) times daily for 3 days.   bisacodyl 10 MG suppository Commonly known as: DULCOLAX Place 10 mg rectally See admin instructions. As needed if no relief from milk of mag  for constipation  give 1 dose in 24 hours.   budesonide-formoterol 160-4.5 MCG/ACT inhaler Commonly known as: SYMBICORT Inhale 2 puffs into the lungs 2 (two) times daily.   calcium carbonate 750 MG chewable tablet Commonly known as: TUMS EX Chew 2 tablets by mouth every 4 (four) hours as needed for heartburn.   Combivent Respimat 20-100 MCG/ACT Aers respimat Generic drug: Ipratropium-Albuterol Inhale 1 puff into the lungs every 6 (six) hours as needed for wheezing or shortness of breath.   cyclobenzaprine 5 MG tablet Commonly known as: FLEXERIL Take 1 tablet (5 mg total) by mouth 2 (two) times daily as needed for muscle spasms.   docusate sodium 100 MG capsule Commonly known as: Colace Take 1 capsule (100 mg total) by mouth 2 (two) times daily. To prevent constipation while taking pain medication.   DULoxetine 60 MG capsule Commonly known as: CYMBALTA Take 60 mg by mouth daily.   ENEMA RE Place 1 application rectally See admin instructions. Give as needed for constipation if no relief from milk of mag or bisacodyl. 1 dose per 24 hours   famotidine 20 MG tablet Commonly known as: PEPCID Take 20 mg by mouth daily.   feeding supplement Liqd Commonly known as: BOOST / RESOURCE BREEZE Take 1 Container by mouth 2 (two) times daily.   fluticasone 50 MCG/ACT nasal spray Commonly known as: FLONASE Place 2 sprays into both nostrils daily as needed for allergies or rhinitis.   furosemide 20 MG tablet Commonly known as: LASIX Take 20  mg by mouth daily.   guaiFENesin 600 MG 12 hr tablet Commonly known as: MUCINEX Take 600 mg by mouth 2 (two) times daily.   magnesium hydroxide 400 MG/5ML suspension Commonly known as: MILK OF MAGNESIA Take 30 mLs by mouth See admin instructions. As needed if no BM in 3 days x 1 dose   metoprolol succinate 25 MG 24 hr tablet Commonly known as: TOPROL-XL Take 25 mg by mouth daily.   morphine 15 MG 12 hr tablet Commonly known as: MS CONTIN Take 1 tablet (15 mg total) by mouth every 12 (twelve) hours as needed for pain.   multivitamin with minerals Tabs tablet Take 1 tablet by mouth daily.   nystatin powder Commonly known as: MYCOSTATIN/NYSTOP Apply 1 application topically in the morning, at noon, in the evening, and at bedtime. Bilateral axilla and abd. folds   olopatadine 0.1 % ophthalmic solution Commonly known as: PATANOL Place 1 drop into both eyes 2 (two) times daily.   OXYGEN Inhale 2 L/min into the lungs continuous.   potassium chloride 10 MEQ tablet Commonly known as: KLOR-CON Take  20 mEq by mouth daily.   pravastatin 40 MG tablet Commonly known as: PRAVACHOL TAKE 1 TABLET BY MOUTH EVERY DAY   saccharomyces boulardii 250 MG capsule Commonly known as: FLORASTOR Take 250 mg by mouth daily.   spironolactone 25 MG tablet Commonly known as: ALDACTONE Take 25 mg by mouth daily.   Vitamin D (Ergocalciferol) 1.25 MG (50000 UNIT) Caps capsule Commonly known as: DRISDOL Take 50,000 Units by mouth every 30 (thirty) days. Take on the 21st of the month            Discharge Care Instructions  (From admission, onward)         Start     Ordered   04/16/20 0000  Discharge wound care:       Comments: Daily dressing changes.   04/16/20 1028          Follow-up Information    Hendricks Limes, MD. Schedule an appointment as soon as possible for a visit in 1 week(s).   Specialty: Internal Medicine Contact information: [address] [place]  [address] [phone]              Allergies  Allergen Reactions  . Strawberry (Diagnostic) Rash  . Duloxetine Nausea And Vomiting  . Ibuprofen Other (See Comments)    REACTION: Bleeding ulcers  . Nsaids Other (See Comments)    REACTION: Bleeding Ulcer  . Latex Itching    Consultations:  None   Procedures/Studies: DG Chest 1 View  Result Date: 04/16/2020 CLINICAL DATA:  Shortness of breath EXAM: CHEST  1 VIEW COMPARISON:  04/14/2020 FINDINGS: Cardiac shadow is stable. Aortic calcifications are again seen. Significantly improved aeration in the left lung is noted with decrease in pleural fluid and only minimal residual infiltrate. No new infiltrate is seen. Scarring is noted bilaterally. IMPRESSION: Significant improved aeration in the left lung when compared with the previous exam. Only minimal residual infiltrate remains. Electronically Signed   By: Inez Catalina M.D.   On: 04/16/2020 08:20   CT Angio Chest PE W and/or Wo Contrast  Result Date: 04/13/2020 CLINICAL DATA:  75 year old female with hypoxia. EXAM: CT ANGIOGRAPHY CHEST WITH CONTRAST TECHNIQUE: Multidetector CT imaging of the chest was performed using the standard protocol during bolus administration of intravenous contrast. Multiplanar CT image reconstructions and MIPs were obtained to evaluate the vascular anatomy. CONTRAST:  70mL OMNIPAQUE IOHEXOL 350 MG/ML SOLN COMPARISON:  Chest CT dated 07/03/2019. FINDINGS: Evaluation is limited due to streak artifact caused by patient's arms. Cardiovascular: There is no cardiomegaly or pericardial effusion. There is coronary vascular calcification of the LAD. Moderate atherosclerotic calcification of the thoracic aorta. No aneurysmal dilatation. There is dilatation of the main pulmonary trunk suggestive of pulmonary hypertension. No pulmonary artery embolus identified. Mediastinum/Nodes: Enlarged hilar and mediastinal lymph nodes measuring 17 mm in the right hilum, and 15 mm in short  axis in the prevascular space. The esophagus and the thyroid gland are grossly unremarkable. No mediastinal fluid collection. Lungs/Pleura: Background of emphysema. There is consolidative changes of the majority of the left lower lobe with overall volume loss. There is high-grade narrowing of the left lower lobe bronchus which may be related to mucous impaction/aspiration. A centrally occlusive lesion is not excluded. There is diffuse peribronchial thickening. There is progression of peribronchial thickening of the right lower lobe/right infrahilar region with narrowing of the right lower lobe bronchus. Scattered nodularity in the left upper lobe and lingula may be chronic or represent atypical infection. Clinical correlation and  follow-up to resolution recommended. Small left pleural effusion. No pneumothorax. Upper Abdomen: No acute abnormality. Musculoskeletal: Osteopenia with multilevel chronic appearing compression fracture and thoracic kyphosis. No acute osseous pathology. Review of the MIP images confirms the above findings. IMPRESSION: 1. No CT evidence of pulmonary artery embolus. 2. Interval progression of peribronchial thickening of the right lower lobe/right infrahilar region with narrowing of the right lower lobe bronchus. 3. Consolidative changes of the majority of the left lower lobe concerning for postobstructive atelectasis or infiltrate. There is high-grade narrowing of the left lower lobe bronchus which may be related to mucous impaction/aspiration. A centrally occlusive lesion is not excluded. Clinical correlation and follow-up to resolution recommended. 4. Scattered nodularity in the left upper lobe and lingula may represent atypical infection. Clinical correlation and follow-up to resolution recommended. 5. Small left pleural effusion. 6. Mediastinal and hilar adenopathy. 7. Aortic Atherosclerosis (ICD10-I70.0) and Emphysema (ICD10-J43.9). Electronically Signed   By: Anner Crete M.D.   On:  04/13/2020 00:00   DG CHEST PORT 1 VIEW  Result Date: 04/14/2020 CLINICAL DATA:  Evaluate for pneumonia. EXAM: PORTABLE CHEST 1 VIEW COMPARISON:  April 12, 2020 FINDINGS: Volume loss and airspace opacities are identified throughout the left lung slightly worse compared prior exam. The right lung is clear. The mediastinal contour and cardiac silhouette are stable. Osseous structures are stable. IMPRESSION: Volume loss and airspace opacities identified throughout the left lung slightly worse compared prior exam. Electronically Signed   By: Abelardo Diesel M.D.   On: 04/14/2020 11:25   DG Chest Portable 1 View  Result Date: 04/12/2020 CLINICAL DATA:  Shortness of breath EXAM: PORTABLE CHEST 1 VIEW COMPARISON:  07/04/2019 FINDINGS: Heart is normal size. Volume loss on the left. Extensive airspace disease throughout the left lung concerning for pneumonia. No confluent opacity on the right. Scarring in the right base. No effusions. IMPRESSION: Volume loss and airspace disease throughout the left lung concerning for pneumonia. Electronically Signed   By: Rolm Baptise M.D.   On: 04/12/2020 19:09      Discharge Exam: Vitals:   04/16/20 0739 04/16/20 0740  BP:    Pulse:  (!) 113  Resp: 20   Temp:    SpO2:  95%   Vitals:   04/16/20 0452 04/16/20 0720 04/16/20 0739 04/16/20 0740  BP:      Pulse: (!) 109   (!) 113  Resp: (!) 21  20   Temp:      TempSrc:      SpO2: 96% 100%  95%  Weight:      Height:        General: Pt is alert, awake, not in acute distress Cardiovascular: RRR, S1/S2 +, no rubs, no gallops Respiratory: CTA bilaterally, no wheezing, no rhonchi, currently on 4 L nasal cannula Abdominal: Soft, NT, ND, bowel sounds + Extremities: no edema, no cyanosis    The results of significant diagnostics from this hospitalization (including imaging, microbiology, ancillary and laboratory) are listed below for reference.     Microbiology: Recent Results (from the past 240 hour(s))   Resp Panel by RT-PCR (Flu A&B, Covid) Nasopharyngeal Swab     Status: None   Collection Time: 04/12/20  6:14 PM   Specimen: Nasopharyngeal Swab; Nasopharyngeal(NP) swabs in vial transport medium  Result Value Ref Range Status   SARS Coronavirus 2 by RT PCR NEGATIVE NEGATIVE Final    Comment: (NOTE) SARS-CoV-2 target nucleic acids are NOT DETECTED.  The SARS-CoV-2 RNA is generally detectable in upper respiratory specimens  during the acute phase of infection. The lowest concentration of SARS-CoV-2 viral copies this assay can detect is 138 copies/mL. A negative result does not preclude SARS-Cov-2 infection and should not be used as the sole basis for treatment or other patient management decisions. A negative result may occur with  improper specimen collection/handling, submission of specimen other than nasopharyngeal swab, presence of viral mutation(s) within the areas targeted by this assay, and inadequate number of viral copies(<138 copies/mL). A negative result must be combined with clinical observations, patient history, and epidemiological information. The expected result is Negative.  Fact Sheet for Patients:  EntrepreneurPulse.com.au  Fact Sheet for Healthcare Providers:  IncredibleEmployment.be  This test is no t yet approved or cleared by the [place] FDA and  has been authorized for detection and/or diagnosis of SARS-CoV-2 by FDA under an Emergency Use Authorization (EUA). This EUA will remain  in effect (meaning this test can be used) for the duration of the COVID-19 declaration under Section 564(b)(1) of the Act, 21 U.S.C.section 360bbb-3(b)(1), unless the authorization is terminated  or revoked sooner.       Influenza A by PCR NEGATIVE NEGATIVE Final   Influenza B by PCR NEGATIVE NEGATIVE Final    Comment: (NOTE) The Xpert Xpress SARS-CoV-2/FLU/RSV plus assay is intended as an aid in the diagnosis of influenza from  Nasopharyngeal swab specimens and should not be used as a sole basis for treatment. Nasal washings and aspirates are unacceptable for Xpert Xpress SARS-CoV-2/FLU/RSV testing.  Fact Sheet for Patients: EntrepreneurPulse.com.au  Fact Sheet for Healthcare Providers: IncredibleEmployment.be  This test is not yet approved or cleared by the [place] FDA and has been authorized for detection and/or diagnosis of SARS-CoV-2 by FDA under an Emergency Use Authorization (EUA). This EUA will remain in effect (meaning this test can be used) for the duration of the COVID-19 declaration under Section 564(b)(1) of the Act, 21 U.S.C. section 360bbb-3(b)(1), unless the authorization is terminated or revoked.  Performed at [facility], [place] [address] [place], [place] [address]   Blood culture (routine x 2)     Status: None (Preliminary result)   Collection Time: 04/12/20  7:00 PM   Specimen: BLOOD LEFT ARM  Result Value Ref Range Status   Specimen Description BLOOD LEFT ARM  Final   Special Requests   Final    BOTTLES DRAWN AEROBIC AND ANAEROBIC Blood Culture adequate volume   Culture   Final    NO GROWTH 4 DAYS Performed at [facility], [place] [address], [place], [place] [address]    Report Status PENDING  Incomplete  MRSA PCR Screening     Status: Abnormal   Collection Time: 04/15/20  7:53 AM   Specimen: Nasal Mucosa; Nasopharyngeal  Result Value Ref Range Status   MRSA by PCR POSITIVE (A) NEGATIVE Final    Comment:        The GeneXpert MRSA Assay (FDA approved for NASAL specimens only), is one component of a comprehensive MRSA colonization surveillance program. It is not intended to diagnose MRSA infection nor to guide or monitor treatment for MRSA infections. RESULT CALLED TO, READ BACK BY AND VERIFIED WITH: Venetia Maxon RN [id number] 04/15/20 A BROWNING Performed at [facility], [place] [address], [place], [place] [address]       Labs: BNP (last 3 results) Recent Labs    07/03/19 1325 04/12/20 1806  BNP 168.6* 938.1*   Basic Metabolic Panel: Recent Labs  Lab 04/12/20 2015 04/13/20 0035  04/14/20 0101 04/15/20 0231 04/16/20 0027  NA 130* 131* 132* 135 135  K 3.7 3.3* 2.9* 3.4* 3.3*  CL 91*  --  98 103 103  CO2 27  --  23 25 23   GLUCOSE 147*  --  141* 120* 143*  BUN 21  --  9 7* 7*  CREATININE 0.85  --  0.47 0.55 0.52  CALCIUM 8.6*  --  8.9 8.9 8.8*  MG  --   --  2.0  --  2.0   Liver Function Tests: Recent Labs  Lab 04/12/20 2015  AST 34  ALT 26  ALKPHOS 100  BILITOT 1.1  PROT 6.6  ALBUMIN 2.8*   Recent Labs  Lab 04/12/20 2015  LIPASE 23   No results for input(s): AMMONIA in the last 168 hours. CBC: Recent Labs  Lab 04/12/20 1806 04/12/20 1956 04/13/20 0035 04/13/20 0448 04/14/20 0101 04/15/20 0231 04/16/20 0027  WBC 19.1*  --   --  18.7* 14.6* 11.1* 9.3  NEUTROABS 16.9*  --   --   --  12.8* 9.1*  --   HGB 13.6   < > 12.9 12.5 11.1* 11.1* 11.9*  HCT 40.4   < > 38.0 37.4 32.9* 34.7* 37.4  MCV 86.9  --   --  86.8 86.1 88.3 88.4  PLT 350  --   --  271 249 280 327   < > = values in this interval not displayed.   Cardiac Enzymes: No results for input(s): CKTOTAL, CKMB, CKMBINDEX, TROPONINI in the last 168 hours. BNP: Invalid input(s): POCBNP CBG: No results for input(s): GLUCAP in the last 168 hours. D-Dimer No results for input(s): DDIMER in the last 72 hours. Hgb A1c No results for input(s): HGBA1C in the last 72 hours. Lipid Profile No results for input(s): CHOL, HDL, LDLCALC, TRIG, CHOLHDL, LDLDIRECT in the last 72 hours. Thyroid function studies No results for input(s): TSH, T4TOTAL, T3FREE, THYROIDAB in the last 72 hours.  Invalid input(s): FREET3 Anemia work up No results for input(s): VITAMINB12, FOLATE, FERRITIN, TIBC, IRON, RETICCTPCT in the last 72 hours. Urinalysis    Component Value Date/Time   COLORURINE YELLOW 04/12/2020 2300   APPEARANCEUR CLEAR  04/12/2020 2300   LABSPEC 1.027 04/12/2020 2300   PHURINE 5.0 04/12/2020 2300   GLUCOSEU NEGATIVE 04/12/2020 2300   GLUCOSEU NEG mg/dL 02/08/2007 2124   HGBUR NEGATIVE 04/12/2020 2300   BILIRUBINUR NEGATIVE 04/12/2020 2300   KETONESUR NEGATIVE 04/12/2020 2300   PROTEINUR NEGATIVE 04/12/2020 2300   UROBILINOGEN 0.2 10/11/2013 1640   NITRITE NEGATIVE 04/12/2020 2300   LEUKOCYTESUR TRACE (A) 04/12/2020 2300   Sepsis Labs Invalid input(s): PROCALCITONIN,  WBC,  LACTICIDVEN Microbiology Recent Results (from the past 240 hour(s))  Resp Panel by RT-PCR (Flu A&B, Covid) Nasopharyngeal Swab     Status: None   Collection Time: 04/12/20  6:14 PM   Specimen: Nasopharyngeal Swab; Nasopharyngeal(NP) swabs in vial transport medium  Result Value Ref Range Status   SARS Coronavirus 2 by RT PCR NEGATIVE NEGATIVE Final    Comment: (NOTE) SARS-CoV-2 target nucleic acids are NOT DETECTED.  The SARS-CoV-2 RNA is generally detectable in upper respiratory specimens during the acute phase of infection. The lowest concentration of SARS-CoV-2 viral copies this assay can detect is 138 copies/mL. A negative result does not preclude SARS-Cov-2 infection and should not be used as the sole basis for treatment or other patient management decisions. A negative result may occur with  improper specimen collection/handling, submission of specimen other than  nasopharyngeal swab, presence of viral mutation(s) within the areas targeted by this assay, and inadequate number of viral copies(<138 copies/mL). A negative result must be combined with clinical observations, patient history, and epidemiological information. The expected result is Negative.  Fact Sheet for Patients:  EntrepreneurPulse.com.au  Fact Sheet for Healthcare Providers:  IncredibleEmployment.be  This test is no t yet approved or cleared by the [place] FDA and  has been authorized for detection and/or  diagnosis of SARS-CoV-2 by FDA under an Emergency Use Authorization (EUA). This EUA will remain  in effect (meaning this test can be used) for the duration of the COVID-19 declaration under Section 564(b)(1) of the Act, 21 U.S.C.section 360bbb-3(b)(1), unless the authorization is terminated  or revoked sooner.       Influenza A by PCR NEGATIVE NEGATIVE Final   Influenza B by PCR NEGATIVE NEGATIVE Final    Comment: (NOTE) The Xpert Xpress SARS-CoV-2/FLU/RSV plus assay is intended as an aid in the diagnosis of influenza from Nasopharyngeal swab specimens and should not be used as a sole basis for treatment. Nasal washings and aspirates are unacceptable for Xpert Xpress SARS-CoV-2/FLU/RSV testing.  Fact Sheet for Patients: EntrepreneurPulse.com.au  Fact Sheet for Healthcare Providers: IncredibleEmployment.be  This test is not yet approved or cleared by the [place] FDA and has been authorized for detection and/or diagnosis of SARS-CoV-2 by FDA under an Emergency Use Authorization (EUA). This EUA will remain in effect (meaning this test can be used) for the duration of the COVID-19 declaration under Section 564(b)(1) of the Act, 21 U.S.C. section 360bbb-3(b)(1), unless the authorization is terminated or revoked.  Performed at [facility], [place] [address], [place], [place] [address]   Blood culture (routine x 2)     Status: None (Preliminary result)   Collection Time: 04/12/20  7:00 PM   Specimen: BLOOD LEFT ARM  Result Value Ref Range Status   Specimen Description BLOOD LEFT ARM  Final   Special Requests   Final    BOTTLES DRAWN AEROBIC AND ANAEROBIC Blood Culture adequate volume   Culture   Final    NO GROWTH 4 DAYS Performed at [facility], [place] [address], [place], [place] [address]    Report Status PENDING  Incomplete  MRSA PCR Screening     Status: Abnormal   Collection Time: 04/15/20  7:53 AM   Specimen: Nasal  Mucosa; Nasopharyngeal  Result Value Ref Range Status   MRSA by PCR POSITIVE (A) NEGATIVE Final    Comment:        The GeneXpert MRSA Assay (FDA approved for NASAL specimens only), is one component of a comprehensive MRSA colonization surveillance program. It is not intended to diagnose MRSA infection nor to guide or monitor treatment for MRSA infections. RESULT CALLED TO, READ BACK BY AND VERIFIED WITH: Venetia Maxon RN [id number] 04/15/20 A BROWNING Performed at [facility], [place] [address] [place],  [address]      Time coordinating discharge: 35 minutes  SIGNED:   Rodena Goldmann, DO [facility] 04/16/2020, 10:30 AM  If 7PM-7AM, please contact night-coverage [email or web address]

## 2020-04-16 NOTE — Progress Notes (Signed)
After pain med was given at 0126, pt resting comfortably in bed. Will continue to monitor pt.

## 2020-04-17 ENCOUNTER — Non-Acute Institutional Stay (SKILLED_NURSING_FACILITY): Payer: Medicare Other | Admitting: Internal Medicine

## 2020-04-17 ENCOUNTER — Encounter: Payer: Self-pay | Admitting: Internal Medicine

## 2020-04-17 DIAGNOSIS — I5032 Chronic diastolic (congestive) heart failure: Secondary | ICD-10-CM | POA: Diagnosis not present

## 2020-04-17 DIAGNOSIS — Z9189 Other specified personal risk factors, not elsewhere classified: Secondary | ICD-10-CM | POA: Diagnosis not present

## 2020-04-17 DIAGNOSIS — E440 Moderate protein-calorie malnutrition: Secondary | ICD-10-CM | POA: Diagnosis not present

## 2020-04-17 DIAGNOSIS — J9601 Acute respiratory failure with hypoxia: Secondary | ICD-10-CM

## 2020-04-17 LAB — CULTURE, BLOOD (ROUTINE X 2)
Culture: NO GROWTH
Special Requests: ADEQUATE

## 2020-04-17 NOTE — Patient Instructions (Signed)
See assessment and plan under each diagnosis in the problem list and acutely for this visit 

## 2020-04-17 NOTE — Assessment & Plan Note (Addendum)
04/17/2020 she has no neck vein distention or significant peripheral edema but predischarge BNP was 790.1.  Close monitor for exacerbation of her diastolic dysfunction indicated.

## 2020-04-17 NOTE — Assessment & Plan Note (Addendum)
Current total protein is low normal at 6.6 and albumin is reduced to 2.8.  Exam reveals limb atrophy.  Her room is loaded with snacks and drinks with high fructose corn syrup as major ingredient. Nutrition consult at SNF.

## 2020-04-17 NOTE — Assessment & Plan Note (Signed)
She repeatedly mentions her pain medication and need for increased , regular dosing.  This is in the context of increased anxiety with panic attacks.  This will be discussed with the [facility] NP.  Perhaps psych NP consultation would be indicated.

## 2020-04-17 NOTE — Assessment & Plan Note (Addendum)
She is manipulating the head of the bed up & down repeatedly which aids sputum production and elimination. Aggressive pulmonary toilet and maintenance O2 will be continued. With the CT changes and her history of almost 90 pack years; a malignant endobronchial lesion in the left lower lobe has not been ruled out.  Unfortunately, her clinical state precludes bronchoscopy at this time.

## 2020-04-17 NOTE — Progress Notes (Signed)
NURSING HOME LOCATION:  [name] ROOM NUMBER: [id number] B CODE STATUS:  DNR  PCP:  Pascal Lux MD  This is a comprehensive readmission note to this SNFperformed on this date less than 30 days from date of readmission. Corrections and additions to the records were documented. Comprehensive physical exam was also performed. Additionally a clinical summary was entered for each active diagnosis pertinent to this admission in the Problem List to enhance continuity of care.  HPI: Patient was hospitalized 3/18-3/22/2022 with sepsis picture associated with acute on chronic hypoxic respiratory failure secondary to healthcare associated pneumonia.  The patient developed shortness of breath with O2 sats as low as the 60s at the facility;  Patient was experiencing cough productive of dark yellow sputum.  The hypoxia was despite 4 L of continuous nasal oxygen.EMS placed her on nonrebreather.   Admission white count was 19,100 with a dramatic left shift. Empirically she received vancomycin and cefepime; MRSA PCR was positive.  Blood cultures revealed no growth.  Covid and influenza panels were negative.  CT angiogram was done to rule out PTE because of the severity of hypoxemia.  This revealed high-grade narrowing of the left lower lobe bronchus possibly related to mucous plugs.  Pulmonology felt that she was not a candidate for bronchoscopy due to her instability.  With aggressive pulmonary toilet, IV fluids and antibiotic regimen there was clinical improvement with trending down of leukocytosis and pro calcitonin levels.  She was felt to be stable enough to return to the SNF on oral Augmentin for 3 additional days. Although the acute on chronic respiratory insufficiency improved; BNP was 790.1 predischarge.  This is in the context of history of diastolic dysfunction.  Past medical and surgical history: Is long and complicated and includes superior mesenteric artery syndrome, prior history  of pleurisy with effusion, chronic pain syndrome, history of osteomyelitis of the mandible, history of ischemic colitis, macular degeneration with bilateral blindness, and history of depression. Surgeries and procedures include esophageal balloon dilation, bowel resection for ischemic colitis, Mohs surgery for basal cell skin cancer, and hysterectomy.  Social history: Nondrinker; former 1.5 pack/day smoker for almost 60 years.  Family history: Noncontributory due to age.   Review of systems:  Date given as Thursday, April 14, 2020.  She tends to be almost giddy with a rambling, nonfocused history. She states that her breathing has improved.  She states she has excess upper respiratory tracts secretions as well as sputum.  She is using the controls to raise & lower the head of her bed repeatedly to facilitate sputum production.  She states this has been very valuable in helping her "clear my lung".  She made the comment that " this lung( pointing to the left) has no air & 4 inches of mucus".  She also states that she is "dead weight from the butt down".  She mentions having some cellulitis of legs. She went on to ramble on about "staff  wanting to buy my Mindi Curling perfume for $160 a vial".  She made several references to making sure she was getting her pain medicine. She describes increasing panic attacks. The temperature on the thermostat was 68; she describes heat intolerance.  Constitutional: No fever, significant weight change  Eyes: No redness, discharge, pain, vision change ENT/mouth: No  earache, new change in hearing, sore throat  Cardiovascular: No chest pain, palpitations, paroxysmal nocturnal dyspnea,  edema  Respiratory: No  hemoptysis,  significant snoring, apnea Gastrointestinal: No heartburn,  dysphagia, abdominal pain, nausea /vomiting, rectal bleeding, melena, change in bowels Genitourinary: No dysuria, hematuria, pyuria, incontinence, nocturia Dermatologic: No  pruritus Neurologic: No dizziness, headache, syncope, seizures Psychiatric: No significant insomnia, anorexia Endocrine: No change in hair/skin/nails, excessive thirst, excessive hunger, excessive urination  Hematologic/lymphatic: No significant bruising, lymphadenopathy, abnormal bleeding Allergy/immunology: No itchy/watery eyes, significant sneezing, urticaria, angioedema  Physical exam:  Pertinent or positive findings: Affect was somewhat inappropriate as noted with intermittent giggling despite the seriousness of her condition.  She is markedly hard of hearing.  Hair is disheveled.  Although she is legally blind she stated she can tell I was wearing a pink shirt.  There is exotropia on the right.  She is wearing the upper plate but not the lower.  She exhibits a rattly cough which was nonproductive while I was in the room.  She does have some expiratory rhonchi anteriorly.  There is also bronchovesicular quality to the respiratory sounds.  There is a slight tach with slight increase in S1.  There are some respiratory variation to the rhythm.  Pedal pulses are decreased.  There is trace edema at the sock line.  There are scattered irregular, somewhat rounded scars over the shins.  There is atrophy of the gastrocnemius muscles.  The lower extremities are weaker than the upper extremities.  Fusiform change of the knees are present. Clubbing of nail beds present.  General appearance:  no acute distress, increased work of breathing is present.   Lymphatic: No lymphadenopathy about the head, neck, axilla. Eyes: No conjunctival inflammation or lid edema is present. There is no scleral icterus. Ears:  External ear exam shows no significant lesions or deformities.   Nose:  External nasal examination shows no deformity or inflammation. Nasal mucosa are pink and moist without lesions, exudates Oral exam: Lips and gums are healthy appearing.There is no oropharyngeal erythema or exudate. Neck:  No  thyromegaly, masses, tenderness noted.    Heart:  No murmur, click, rub.  Lungs:  without wheezes, rales, rubs. Abdomen: Bowel sounds are normal.  Abdomen is soft and nontender with no organomegaly, hernias, masses. GU: Deferred  Extremities:  No cyanosis. Neurologic exam: Balance, Rhomberg, finger to nose testing could not be completed due to clinical state Skin: Warm & dry w/o tenting.  See clinical summary under each active problem in the Problem List with associated updated therapeutic plan

## 2020-04-30 ENCOUNTER — Other Ambulatory Visit: Payer: Self-pay

## 2020-04-30 ENCOUNTER — Non-Acute Institutional Stay: Payer: Self-pay | Admitting: Hospice

## 2020-04-30 DIAGNOSIS — Z515 Encounter for palliative care: Secondary | ICD-10-CM

## 2020-04-30 NOTE — Progress Notes (Signed)
[facility] Pall[facility] Consult Note Telephone: [phone]  Fax: [phone]  PATIENT NAME: [name] Bell DOB: [birth year] MRN: [id number]  PRIMARY CARE PROVIDER:   Hendricks Limes, MD Hendricks Limes, [place] [address],  [place] [address]  Lorette Ang, NP  REFERRING PROVIDER: Hendricks Limes, MD Lorette Ang, NP RESPONSIBLE PARTY:  Self Emergency contact: [name]  - friend [phone]   Visit is to build trust and highlight Palliative Medicine as specialized medical care for people living with serious illness, aimed at facilitating better quality of life through symptoms relief, assisting with advance care plan and establishing goals of care.  Patient endorsed palliative service.  CHIEF COMPLAINT: Palliative follow up visit/Shortness of breath  RECOMMENDATIONS/PLAN:   1. Advance Care Planning/Code Status: Patient is a DO NOT RESUSCITATE.  Signed DNR form is in epic.  2. Goals of Care: Goals of care include to maximize quality of life and symptom management. Do not Hospitalize  Visit consisted of counseling and education dealing with the complex and emotionally intense issues of symptom management and palliative care in the setting of serious and potentially life-threatening illness. Palliative care team will continue to support patient, patient's family, and medical team.  I spent 46 minutes providing this consultation. More than 50% of the time in this consultation was spent on coordinating communication.  -------------------------------------------------------------------------------------------------------------------------------------------------- 3. Symptom management/Plan:  Shortness of breath: Encouraged slow deep breathing, use of fan for a circulation.  Continue oxygen supplementation 4-5L/Min conitnuous, breathing treatments as ordered.  Continue pulmonary hygiene. CHF: Collaboration with [facility] NP to  increase Lasix from 20mg  daily to 20mg  twice a day. Continue Spirolactone as ordered. Routine labs CBC BMP, and BNP.  No added salt, Continue diuretic as ordered. Fluid guidance of not more than 1500cc per day. Anxiety: Continue Xanax as currently ordered.  Gradual dose reduction as tolerated without decompensation. Palliative will continue to monitor for symptom management/decline and make recommendations as needed. Return 2 months or prn. Encouraged to call provider sooner with any concerns.   HISTORY OF PRESENT ILLNESS:  [name] Bell is a 75 y.o. female with multiple medical problems including shortness of breath worsened with recent sepsis, impairing quality of life.  Patient was hospitalized 3/18-3/22/2022 with acute on chronic hypoxic respiratory failure secondary to healthcare acquired pneumonia/sepsis.  Patient was treated with aggressive antibiotics, IV fluids, pulmonary toilet and was stabilized; discharged to SNF on oral Augmentin which she has completed.  Patient continues on continuous oxygen supplementation 45 L/min.  History of COPD, depression, anxiety, congestive heart failure.  History obtained from review of EMR, discussion with primary team, and  interview with family, caregiver  and/or patient. Records reviewed and summarized above. All 10 point systems reviewed and are negative except as documented in history of present illness above  Review and summarization of Epic records shows history from other than patient.   Palliative Care was asked to follow this patient by consultation request of Hendricks Limes, MD to help address complex decision making in the context of advance care planning and goals of care clarification.   CODE STATUS: DNR  PPS:  Weak 40%  HOSPICE ELIGIBILITY/DIAGNOSIS: TBD  PAST MEDICAL HISTORY:  Past Medical History:  Diagnosis Date  . Abnormal chest x-ray 03/29/2018   [facility] PCXR 2/27& 03/28/2018 R perihilar/upper lobe ill-defined infiltrate suggested on the  second film. WBC normal with minimal left shift.  Suggestion of R CPA blunting & possible effusion LLL.  Rhonchi & wheezing ; O2 sats < 90%.  Purulent sputum was described to her by hospital staff.Rx for Augmentin,steroids, nebs 3/13 mobile imaging PCXR: RUL infiltrate essentially resolved; new RLL oval infiltra  . Acute sinusitis 06/30/2011  . Anemia   . Anxiety   . Basal cell carcinoma    "left cheek"  . Bleeding ulcer   . Blind in both eyes   . Chronic back pain   . Degenerative disk disease    This is interscapular, mild compression frx of T12 superior endplate with Schmorl's node. Seen on CT in 2/08  . Depression   . Duodenal ulcer 11/08   With hemorrhage and obstruction  . Hair loss 06/24/2016  . History of blood transfusion    "related to bleeding from intestines and vomiting blood"  . Ischemic colitis ([place]) 07/30/2011   2009 by endoscopy   . Macular degeneration, wet ([place])   . Osteomyelitis, jaw acute 11/08   Started while in the hospital abcess showed GNR . SP debridement by Dr. Lilli Few,  Priscella Mann S Bovis sp 4  weeks of Pen V started on 05/19/07  with additional 2 weeks in 07/04/07  . Pain syndrome, chronic   . Perforated bowel [facility])    surgery july 2013  . Pleurisy with pleural effusion 04/16/2018   See 04/14/2018 SNF notes Lovenox changed to Eliquis because of pleuritic pain, persistent left lower extremity pain, and possible hemoptysis. There was a dramatic elevation of d-dimer with a value of 2320  . Sinus tachycardia 09/11/2015  . Superior mesenteric artery syndrome ([place]) 11/08   sp dilation by Dr. Watt Climes, Pt may require bowel resetion if sx recur     SOCIAL HX: @SOCX  Patient [facility] for ongoing care  Social history: former 1.5 pack/day smoker for almost 60 years.   FAMILY HX:  Family History  Problem Relation Age of Onset  . Cancer Mother        Lung  . Heart disease Father   . Diabetes Father   . Diabetes Sister     Review lab tests/diagnostics Results for  KYMIA, SIMI (MRN [id number]) as of 04/30/2020 17:00  Ref. Range 04/16/2020 00:27  Sodium Latest Ref Range: 135 - 145 mmol/L 135  Potassium Latest Ref Range: 3.5 - 5.1 mmol/L 3.3 (L)  Chloride Latest Ref Range: 98 - 111 mmol/L 103  CO2 Latest Ref Range: 22 - 32 mmol/L 23  Glucose Latest Ref Range: 70 - 99 mg/dL 143 (H)  BUN Latest Ref Range: 8 - 23 mg/dL 7 (L)  Creatinine Latest Ref Range: 0.44 - 1.00 mg/dL 0.52  Calcium Latest Ref Range: 8.9 - 10.3 mg/dL 8.8 (L)  Anion gap Latest Ref Range: 5 - 15  9  Magnesium Latest Ref Range: 1.7 - 2.4 mg/dL 2.0  GFR, Estimated Latest Ref Range: >60 mL/min >60  B Natriuretic Peptide Latest Ref Range: 0.0 - 100.0 pg/mL 790.1 (H)  Procalcitonin Latest Units: ng/mL 0.15  WBC Latest Ref Range: 4.0 - 10.5 K/uL 9.3   No results for input(s): WBC, HGB, HCT, PLT, MCV in the last 168 hours. No results for input(s): NA, K, CL, CO2, BUN, CREATININE, GLUCOSE in the last 168 hours. Latest GFR by Cockcroft Gault (not valid in AKI or ESRD) CrCl cannot be calculated (Unknown ideal weight.).  ALLERGIES:  Allergies  Allergen Reactions  . Strawberry (Diagnostic) Rash  . Duloxetine Nausea And Vomiting  . Ibuprofen Other (See Comments)    REACTION: Bleeding ulcers  . Nsaids  Other (See Comments)    REACTION: Bleeding Ulcer  . Latex Itching      PERTINENT MEDICATIONS:  Outpatient Encounter Medications as of 04/30/2020  Medication Sig  . acetaminophen (TYLENOL) 650 MG CR tablet Take 650 mg by mouth See admin instructions. Tid x 5 days  . albuterol (VENTOLIN HFA) 108 (90 Base) MCG/ACT inhaler TAKE 2 PUFFS BY MOUTH EVERY 6 HOURS AS NEEDED FOR WHEEZE OR SHORTNESS OF BREATH (Patient taking differently: Inhale 2 puffs into the lungs every 6 (six) hours as needed for shortness of breath or wheezing.)  . bisacodyl (DULCOLAX) 10 MG suppository Place 10 mg rectally See admin instructions. As needed if no relief from milk of mag  for constipation  give 1 dose in 24 hours.  .  budesonide-formoterol (SYMBICORT) 160-4.5 MCG/ACT inhaler Inhale 2 puffs into the lungs 2 (two) times daily.  . calcium carbonate (TUMS EX) 750 MG chewable tablet Chew 2 tablets by mouth every 4 (four) hours as needed for heartburn.  . cyclobenzaprine (FLEXERIL) 5 MG tablet Take 1 tablet (5 mg total) by mouth 2 (two) times daily as needed for muscle spasms.  Marland Kitchen docusate sodium (COLACE) 100 MG capsule Take 1 capsule (100 mg total) by mouth 2 (two) times daily. To prevent constipation while taking pain medication.  . DULoxetine (CYMBALTA) 60 MG capsule Take 60 mg by mouth daily.  . famotidine (PEPCID) 20 MG tablet Take 20 mg by mouth daily.  . feeding supplement (BOOST / RESOURCE BREEZE) LIQD Take 1 Container by mouth 2 (two) times daily.  . fluticasone (FLONASE) 50 MCG/ACT nasal spray Place 2 sprays into both nostrils daily as needed for allergies or rhinitis.  . furosemide (LASIX) 20 MG tablet Take 20 mg by mouth daily.  Marland Kitchen guaiFENesin (MUCINEX) 600 MG 12 hr tablet Take 600 mg by mouth 2 (two) times daily.  . Ipratropium-Albuterol (COMBIVENT RESPIMAT) 20-100 MCG/ACT AERS respimat Inhale 1 puff into the lungs every 6 (six) hours as needed for wheezing or shortness of breath.  . magnesium hydroxide (MILK OF MAGNESIA) 400 MG/5ML suspension Take 30 mLs by mouth See admin instructions. As needed if no BM in 3 days x 1 dose  . metoprolol succinate (TOPROL-XL) 25 MG 24 hr tablet Take 25 mg by mouth daily.  Marland Kitchen morphine (MS CONTIN) 15 MG 12 hr tablet Take 1 tablet (15 mg total) by mouth every 12 (twelve) hours as needed for pain.  . Multiple Vitamin (MULTIVITAMIN WITH MINERALS) TABS tablet Take 1 tablet by mouth daily.  Marland Kitchen nystatin (MYCOSTATIN/NYSTOP) powder Apply 1 application topically in the morning, at noon, in the evening, and at bedtime. Bilateral axilla and abd. folds  . olopatadine (PATANOL) 0.1 % ophthalmic solution Place 1 drop into both eyes 2 (two) times daily.  . OXYGEN Inhale 2 L/min into the lungs  continuous.  . potassium chloride (KLOR-CON) 10 MEQ tablet Take 20 mEq by mouth daily.  . pravastatin (PRAVACHOL) 40 MG tablet TAKE 1 TABLET BY MOUTH EVERY DAY (Patient taking differently: Take 40 mg by mouth daily.)  . saccharomyces boulardii (FLORASTOR) 250 MG capsule Take 250 mg by mouth daily.  . Sodium Phosphates (ENEMA RE) Place 1 application rectally See admin instructions. Give as needed for constipation if no relief from milk of mag or bisacodyl. 1 dose per 24 hours  . spironolactone (ALDACTONE) 25 MG tablet Take 25 mg by mouth daily.  . Vitamin D, Ergocalciferol, (DRISDOL) 1.25 MG (50000 UNIT) CAPS capsule Take 50,000 Units by mouth every 30 (  thirty) days. Take on the 21st of the month  . [DISCONTINUED] sucralfate (CARAFATE) 1 g tablet Take 1 tablet (1 g total) by mouth 4 (four) times daily -  with meals and at bedtime.   No facility-administered encounter medications on file as of 04/30/2020.     PHYSICAL EXAM  General: In no acute distress Cardiovascular: regular rate and rhythm; no edema in BLE Pulmonary: no cough, no increased work of breathing, congestion/rhonchi in upper lobes that cleared with a cough; normal respiratory effort on oxygen supplementation 4 L/min Abdomen: soft, non tender, positive bowel sounds in all quadrants; colostomy bag in place GU:  no suprapubic tenderness Eyes: Normal lids, no discharge, sclera anicteric ENMT: Moist mucous membranes Musculoskeletal:  no joint deformity, stiffness, ---sarcopenia Skin: no rash to visible skin, warm without cyanosis Psych: non-anxious affect Neurological: Weakness but otherwise non focal Heme/lymph/immuno: no bruises, no bleeding  Thank you for the opportunity to participate in the care of Sharnise G Winquist Please call our office at [phone] if we can be of additional assistance.  Note: Portions of this note were generated with Lobbyist. Dictation errors may occur despite best attempts at  proofreading.  Teodoro Spray, NP

## 2020-05-23 ENCOUNTER — Emergency Department (HOSPITAL_COMMUNITY): Payer: Medicare Other

## 2020-05-23 ENCOUNTER — Observation Stay (HOSPITAL_COMMUNITY)
Admission: EM | Admit: 2020-05-23 | Discharge: 2020-05-25 | Disposition: A | Discharge status: Home / Self Care | Payer: Medicare Other | Attending: Internal Medicine | Admitting: Internal Medicine

## 2020-05-23 DIAGNOSIS — Z79899 Other long term (current) drug therapy: Secondary | ICD-10-CM | POA: Diagnosis not present

## 2020-05-23 DIAGNOSIS — Z933 Colostomy status: Secondary | ICD-10-CM

## 2020-05-23 DIAGNOSIS — Z79891 Long term (current) use of opiate analgesic: Secondary | ICD-10-CM | POA: Insufficient documentation

## 2020-05-23 DIAGNOSIS — Z515 Encounter for palliative care: Secondary | ICD-10-CM | POA: Diagnosis not present

## 2020-05-23 DIAGNOSIS — R6521 Severe sepsis with septic shock: Secondary | ICD-10-CM | POA: Diagnosis not present

## 2020-05-23 DIAGNOSIS — A419 Sepsis, unspecified organism: Principal | ICD-10-CM | POA: Insufficient documentation

## 2020-05-23 DIAGNOSIS — Z20822 Contact with and (suspected) exposure to covid-19: Secondary | ICD-10-CM | POA: Insufficient documentation

## 2020-05-23 DIAGNOSIS — J449 Chronic obstructive pulmonary disease, unspecified: Secondary | ICD-10-CM | POA: Diagnosis present

## 2020-05-23 DIAGNOSIS — R54 Age-related physical debility: Secondary | ICD-10-CM | POA: Diagnosis not present

## 2020-05-23 DIAGNOSIS — Z87891 Personal history of nicotine dependence: Secondary | ICD-10-CM | POA: Insufficient documentation

## 2020-05-23 DIAGNOSIS — J690 Pneumonitis due to inhalation of food and vomit: Secondary | ICD-10-CM | POA: Diagnosis not present

## 2020-05-23 DIAGNOSIS — G8929 Other chronic pain: Secondary | ICD-10-CM | POA: Diagnosis present

## 2020-05-23 DIAGNOSIS — Z85828 Personal history of other malignant neoplasm of skin: Secondary | ICD-10-CM | POA: Insufficient documentation

## 2020-05-23 DIAGNOSIS — I5032 Chronic diastolic (congestive) heart failure: Secondary | ICD-10-CM | POA: Diagnosis not present

## 2020-05-23 DIAGNOSIS — J9621 Acute and chronic respiratory failure with hypoxia: Secondary | ICD-10-CM | POA: Diagnosis not present

## 2020-05-23 DIAGNOSIS — R109 Unspecified abdominal pain: Secondary | ICD-10-CM | POA: Insufficient documentation

## 2020-05-23 DIAGNOSIS — M549 Dorsalgia, unspecified: Secondary | ICD-10-CM | POA: Diagnosis present

## 2020-05-23 DIAGNOSIS — I110 Hypertensive heart disease with heart failure: Secondary | ICD-10-CM | POA: Diagnosis not present

## 2020-05-23 DIAGNOSIS — Z91040 Latex allergy status: Secondary | ICD-10-CM | POA: Insufficient documentation

## 2020-05-23 DIAGNOSIS — R069 Unspecified abnormalities of breathing: Secondary | ICD-10-CM | POA: Diagnosis present

## 2020-05-23 DIAGNOSIS — J189 Pneumonia, unspecified organism: Secondary | ICD-10-CM

## 2020-05-23 DIAGNOSIS — E440 Moderate protein-calorie malnutrition: Secondary | ICD-10-CM | POA: Diagnosis present

## 2020-05-23 LAB — CBC WITH DIFFERENTIAL/PLATELET
Abs Immature Granulocytes: 0 10*3/uL (ref 0.00–0.07)
Basophils Absolute: 0.2 10*3/uL — ABNORMAL HIGH (ref 0.0–0.1)
Basophils Relative: 1 %
Eosinophils Absolute: 0.6 10*3/uL — ABNORMAL HIGH (ref 0.0–0.5)
Eosinophils Relative: 3 %
HCT: 50.9 % — ABNORMAL HIGH (ref 36.0–46.0)
Hemoglobin: 16.2 g/dL — ABNORMAL HIGH (ref 12.0–15.0)
Lymphocytes Relative: 5 %
Lymphs Abs: 0.9 10*3/uL (ref 0.7–4.0)
MCH: 28.3 pg (ref 26.0–34.0)
MCHC: 31.8 g/dL (ref 30.0–36.0)
MCV: 88.8 fL (ref 80.0–100.0)
Monocytes Absolute: 1.3 10*3/uL — ABNORMAL HIGH (ref 0.1–1.0)
Monocytes Relative: 7 %
Neutro Abs: 15.5 10*3/uL — ABNORMAL HIGH (ref 1.7–7.7)
Neutrophils Relative %: 84 %
Platelets: 351 10*3/uL (ref 150–400)
RBC: 5.73 MIL/uL — ABNORMAL HIGH (ref 3.87–5.11)
RDW: 14.6 % (ref 11.5–15.5)
WBC: 18.5 10*3/uL — ABNORMAL HIGH (ref 4.0–10.5)
nRBC: 0 % (ref 0.0–0.2)
nRBC: 0 /100 WBC

## 2020-05-23 LAB — URINALYSIS, ROUTINE W REFLEX MICROSCOPIC
Bilirubin Urine: NEGATIVE
Glucose, UA: NEGATIVE mg/dL
Hgb urine dipstick: NEGATIVE
Ketones, ur: 5 mg/dL — AB
Leukocytes,Ua: NEGATIVE
Nitrite: NEGATIVE
Protein, ur: NEGATIVE mg/dL
Specific Gravity, Urine: >1.046 — ABNORMAL HIGH (ref 1.005–1.030)
pH: 5 (ref 5.0–8.0)

## 2020-05-23 LAB — RESP PANEL BY RT-PCR (FLU A&B, COVID) ARPGX2
Influenza A by PCR: NEGATIVE
Influenza B by PCR: NEGATIVE
SARS Coronavirus 2 by RT PCR: NEGATIVE

## 2020-05-23 LAB — APTT: aPTT: 32 seconds (ref 24–36)

## 2020-05-23 LAB — COMPREHENSIVE METABOLIC PANEL
ALT: 13 U/L (ref 0–44)
AST: 23 U/L (ref 15–41)
Albumin: 2.8 g/dL — ABNORMAL LOW (ref 3.5–5.0)
Alkaline Phosphatase: 150 U/L — ABNORMAL HIGH (ref 38–126)
Anion gap: 14 (ref 5–15)
BUN: 7 mg/dL — ABNORMAL LOW (ref 8–23)
CO2: 27 mmol/L (ref 22–32)
Calcium: 9.2 mg/dL (ref 8.9–10.3)
Chloride: 94 mmol/L — ABNORMAL LOW (ref 98–111)
Creatinine, Ser: 0.74 mg/dL (ref 0.44–1.00)
GFR, Estimated: >60 mL/min (ref >60)
Glucose, Bld: 120 mg/dL — ABNORMAL HIGH (ref 70–99)
Potassium: 3.5 mmol/L (ref 3.5–5.1)
Sodium: 135 mmol/L (ref 135–145)
Total Bilirubin: 1 mg/dL (ref 0.3–1.2)
Total Protein: 6.2 g/dL — ABNORMAL LOW (ref 6.5–8.1)

## 2020-05-23 LAB — I-STAT ARTERIAL BLOOD GAS, ED
Acid-Base Excess: 7 mmol/L — ABNORMAL HIGH (ref 0.0–2.0)
Bicarbonate: 30.9 mmol/L — ABNORMAL HIGH (ref 20.0–28.0)
Calcium, Ion: 1.18 mmol/L (ref 1.15–1.40)
HCT: 47 % — ABNORMAL HIGH (ref 36.0–46.0)
Hemoglobin: 16 g/dL — ABNORMAL HIGH (ref 12.0–15.0)
O2 Saturation: 99 %
Patient temperature: 36.7
Potassium: 3.2 mmol/L — ABNORMAL LOW (ref 3.5–5.1)
Sodium: 135 mmol/L (ref 135–145)
TCO2: 32 mmol/L (ref 22–32)
pCO2 arterial: 39.7 mmHg (ref 32.0–48.0)
pH, Arterial: 7.498 — ABNORMAL HIGH (ref 7.350–7.450)
pO2, Arterial: 107 mmHg (ref 83.0–108.0)

## 2020-05-23 LAB — PROCALCITONIN: Procalcitonin: 3.89 ng/mL

## 2020-05-23 LAB — PROTIME-INR
INR: 1 (ref 0.8–1.2)
Prothrombin Time: 12.8 seconds (ref 11.4–15.2)

## 2020-05-23 LAB — LACTIC ACID, PLASMA
Lactic Acid, Venous: 3 mmol/L (ref 0.5–1.9)
Lactic Acid, Venous: 2.1 mmol/L (ref 0.5–1.9)

## 2020-05-23 MED ORDER — GLYCOPYRROLATE 0.2 MG/ML IJ SOLN
0.2000 mg | INTRAMUSCULAR | Status: DC | PRN
  Administered 2020-05-23: 0.2 mg via INTRAVENOUS
  Filled 2020-05-23: qty 1

## 2020-05-23 MED ORDER — ACETAMINOPHEN 325 MG PO TABS: 650.0000 mg | ORAL_TABLET | Freq: Four times a day (QID) | ORAL | Status: DC | PRN

## 2020-05-23 MED ORDER — HALOPERIDOL 0.5 MG PO TABS
0.5000 mg | ORAL_TABLET | ORAL | Status: DC | PRN
  Filled 2020-05-23: qty 1

## 2020-05-23 MED ORDER — DIPHENHYDRAMINE HCL 50 MG/ML IJ SOLN: 12.5000 mg | INTRAMUSCULAR | Status: DC | PRN

## 2020-05-23 MED ORDER — ONDANSETRON HCL 4 MG PO TABS: 4.0000 mg | ORAL_TABLET | Freq: Four times a day (QID) | ORAL | Status: DC | PRN

## 2020-05-23 MED ORDER — LACTATED RINGERS IV SOLN: INTRAVENOUS | Status: DC

## 2020-05-23 MED ORDER — HALOPERIDOL LACTATE 2 MG/ML PO CONC
0.5000 mg | ORAL | Status: DC | PRN
Start: 2020-05-23 — End: 2020-05-25
  Filled 2020-05-23: qty 0.3

## 2020-05-23 MED ORDER — MOMETASONE FURO-FORMOTEROL FUM 200-5 MCG/ACT IN AERO: 2.0000 | INHALATION_SPRAY | Freq: Two times a day (BID) | RESPIRATORY_TRACT | Status: DC

## 2020-05-23 MED ORDER — GLYCOPYRROLATE 0.2 MG/ML IJ SOLN: 0.2000 mg | INTRAMUSCULAR | Status: DC | PRN

## 2020-05-23 MED ORDER — ACETAMINOPHEN 650 MG RE SUPP
650.0000 mg | Freq: Four times a day (QID) | RECTAL | Status: DC | PRN
Start: 2020-05-23 — End: 2020-05-25

## 2020-05-23 MED ORDER — SODIUM CHLORIDE 0.9 % IV SOLN
2.0000 g | Freq: Once | INTRAVENOUS | Status: AC
  Administered 2020-05-23: 2 g via INTRAVENOUS
  Filled 2020-05-23: qty 2

## 2020-05-23 MED ORDER — ACETAMINOPHEN 500 MG PO TABS
1000.0000 mg | ORAL_TABLET | Freq: Once | ORAL | Status: AC
  Administered 2020-05-23: 1000 mg via ORAL
  Filled 2020-05-23: qty 2

## 2020-05-23 MED ORDER — OLOPATADINE HCL 0.1 % OP SOLN: 1.0000 [drp] | Freq: Two times a day (BID) | OPHTHALMIC | Status: DC

## 2020-05-23 MED ORDER — HALOPERIDOL LACTATE 5 MG/ML IJ SOLN
0.5000 mg | INTRAMUSCULAR | Status: DC | PRN
Start: 2020-05-23 — End: 2020-05-25

## 2020-05-23 MED ORDER — IOHEXOL 300 MG/ML  SOLN
75.0000 mL | Freq: Once | INTRAMUSCULAR | Status: AC | PRN
  Administered 2020-05-23: 65 mL via INTRAVENOUS

## 2020-05-23 MED ORDER — LORAZEPAM 2 MG/ML IJ SOLN: 1.0000 mg | INTRAMUSCULAR | Status: DC | PRN

## 2020-05-23 MED ORDER — LORAZEPAM 2 MG/ML PO CONC: 1.0000 mg | ORAL | Status: DC | PRN

## 2020-05-23 MED ORDER — VANCOMYCIN HCL 1000 MG/200ML IV SOLN: 1000.0000 mg | Freq: Once | INTRAVENOUS | Status: DC

## 2020-05-23 MED ORDER — SODIUM CHLORIDE 0.9 % IV SOLN: 2.0000 g | Freq: Once | INTRAVENOUS | Status: DC

## 2020-05-23 MED ORDER — LACTATED RINGERS IV BOLUS
800.0000 mL | Freq: Once | INTRAVENOUS | Status: AC
  Administered 2020-05-23: 800 mL via INTRAVENOUS

## 2020-05-23 MED ORDER — LACTATED RINGERS IV BOLUS
1000.0000 mL | Freq: Once | INTRAVENOUS | Status: AC
  Administered 2020-05-23: 1000 mL via INTRAVENOUS

## 2020-05-23 MED ORDER — SODIUM CHLORIDE 0.9% FLUSH
3.0000 mL | Freq: Two times a day (BID) | INTRAVENOUS | Status: DC
  Administered 2020-05-23 – 2020-05-24 (×3): 3 mL via INTRAVENOUS

## 2020-05-23 MED ORDER — BIOTENE DRY MOUTH MT LIQD: 15.0000 mL | OROMUCOSAL | Status: DC | PRN

## 2020-05-23 MED ORDER — ONDANSETRON HCL 4 MG/2ML IJ SOLN
4.0000 mg | Freq: Four times a day (QID) | INTRAMUSCULAR | Status: DC | PRN
  Administered 2020-05-23: 4 mg via INTRAVENOUS
  Filled 2020-05-23: qty 2

## 2020-05-23 MED ORDER — POLYVINYL ALCOHOL 1.4 % OP SOLN
1.0000 [drp] | Freq: Four times a day (QID) | OPHTHALMIC | Status: DC | PRN
  Filled 2020-05-23: qty 15

## 2020-05-23 MED ORDER — LACTATED RINGERS IV BOLUS
500.0000 mL | Freq: Once | INTRAVENOUS | Status: AC
  Administered 2020-05-23: 500 mL via INTRAVENOUS

## 2020-05-23 MED ORDER — LORAZEPAM 1 MG PO TABS: 1.0000 mg | ORAL_TABLET | ORAL | Status: DC | PRN

## 2020-05-23 MED ORDER — MORPHINE SULFATE (PF) 4 MG/ML IV SOLN
4.0000 mg | INTRAVENOUS | Status: DC | PRN
  Administered 2020-05-23 – 2020-05-24 (×6): 4 mg via INTRAVENOUS
  Filled 2020-05-23 (×6): qty 1

## 2020-05-23 MED ORDER — GLYCOPYRROLATE 1 MG PO TABS
1.0000 mg | ORAL_TABLET | ORAL | Status: DC | PRN
  Filled 2020-05-23: qty 1

## 2020-05-23 MED ORDER — VANCOMYCIN HCL 1250 MG/250ML IV SOLN
1250.0000 mg | Freq: Once | INTRAVENOUS | Status: AC
  Administered 2020-05-23: 1250 mg via INTRAVENOUS
  Filled 2020-05-23: qty 250

## 2020-05-23 MED ORDER — ENOXAPARIN SODIUM 40 MG/0.4ML IJ SOSY: 40.0000 mg | PREFILLED_SYRINGE | INTRAMUSCULAR | Status: DC

## 2020-05-23 NOTE — ED Provider Notes (Signed)
Assumed patient care by previous provider.  Please refer to their note for full HPI.  Briefly this is a 75 year old female presents the emergency department and sepsis.  Chest x-ray is concerning for pneumonia, we are pending CT of the abdomen pelvis to rule out an acute abdominal process. Physical Exam  BP (!) 97/55   Pulse (!) 107   Temp 98 F (36.7 C) (Axillary)   Resp (!) 24   Ht 5\' 4"  (1.626 m)   Wt 59 kg   LMP 08/25/1980   SpO2 99%   BMI 22.33 kg/m   Physical Exam Vitals and nursing note reviewed.  Constitutional:      Appearance: She is ill-appearing. She is not diaphoretic.  HENT:     Head: Normocephalic.     Mouth/Throat:     Mouth: Mucous membranes are moist.  Cardiovascular:     Rate and Rhythm: Tachycardia present.  Pulmonary:     Comments: Periods of tachypnea, decreased breath sounds b/l with scattered rales Abdominal:     Palpations: Abdomen is soft.     Tenderness: There is no abdominal tenderness.  Skin:    General: Skin is warm.  Neurological:     Mental Status: She is alert and oriented to person, place, and time. Mental status is at baseline.  Psychiatric:        Mood and Affect: Mood normal.     ED Course/Procedures     Procedures  MDM  CT of the abdomen pelvis does not identify any acute abdominal process.  This appears to be septic shock secondary to pneumonia.  Spoke with the nurse practitioner from her facility, they believe patient's presentation is a chronic problem of most likely aspiration.  When the patient is alert and oriented and of sound mind she has mentioned possibly moving her care to hospice.  However there has been no official documentation of this.  At this time patient is not alert and oriented and does not have capacity to make her medical decision.  There is no listed family.  Spoke with ICU, she has been fluid responsive and currently her map is stable and she is protecting her airway.  They recommend medical admission with the  possibility of hospice.  Patient is DNR/DNI.       Lorelle Gibbs, DO 05/23/20 1202

## 2020-05-23 NOTE — ED Notes (Signed)
IV team at bedside 

## 2020-05-23 NOTE — ED Provider Notes (Signed)
I just received a call from the attending physician from the patient's nursing facility where she is currently getting her care, she stated that this patient actually has a do not hospitalize request, DO NOT INTUBATE, DO NOT RESUSCITATE and was already getting intravenous pain medicine for palliative care.  The patient did not want to be hospitalized and they requested that the patient be transferred back to the facility or at least consider transfer back to the facility to continue palliative care in their setting.  Unfortunately the patient has left the floor to go upstairs at this point so I communicated with the hospitalist, Dr. Posey Pronto, he will communicate to the upstairs team, phone number for covering physician from nursing facility given.   Noemi Chapel, MD 05/23/20 [phone]

## 2020-05-23 NOTE — ED Notes (Signed)
[facility] MD notified of IV fluid infiltration

## 2020-05-23 NOTE — ED Notes (Signed)
Pt very confused and unable to answer orientation questions. Pt is talking about things that don't connect and make sense. Pt unable to follow command to squeeze my hand. Pt eye opening is spontaneous.

## 2020-05-23 NOTE — Consult Note (Signed)
Consultation Note Date: 05/23/2020   Patient Name: [name] Bell  DOB: 12/06/1945  MRN: [id number]  Age / Sex: 75 y.o., female  PCP: Hendricks Limes, MD Referring Physician: Karmen Bongo, MD  Reason for Consultation: end of life care  HPI/Patient Profile: 75 y.o. female  with past medical history of chronic diastolic CHF, COPD on chronic home oxygen, small bowel perforation with resultant colostomy, visual impairment, colitis/ulcer, chronic pain, HTN, and HLD.  She presented to the emergency department on 05/23/2020 with respiratory distress. Spo2 88% on 6L O2 by EMS and increased to NRB. She has known history of aspiration.  ED Course: BP decreased as low as 78/64, improved with IV fluid. Chest x-ray shows recurrent left mid and lower lung zone pulmonary infiltrate, suspicious for infection or aspiration. Lactic acid 3.0, Albumin 2.8  Clinical Assessment and Goals of Care: I have reviewed medical records including EPIC notes, labs and imaging, discussed with bedside RN, and examined the patient. She is slow to respond verbally to questions, and speech is nonsensical.    I called her friend/[name] Bell  to discuss diagnosis, prognosis, GOC, EOL wishes, disposition, and options.  I introduced Palliative Medicine as specialized medical care for people living with serious illness. It focuses on providing relief from the symptoms and stress of a serious illness.   [name] Bell has known [name] Bell") for about 3 years. They lived in the same apartment complex together and became very close friends. [name] Bell describes [name] Bell as "the sweetest person you will ever meet". 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  [name] Bell states that as long as she has known her, [name] Bell never mentioned any family. However, another friend reported to  [place] that [name] Bell told him she had a son who lives in [place] but they are estranged.   We discussed her current illness and what it means in the larger context of her ongoing co-morbidities.  Natural trajectory at EOL was discussed. Reviewed PMH and current hospital status with [name] Bell. Patient had irreversible dysphagia, which is causing her to develop recurrent aspiration pneumonia. Explained that it is a terminal condition.  Confirmed that [name] Bell had decided to pursue comfort measures only. Discussed that comfort measures includes keeping her clean and dry, no labs, no artificial hydration or feeding, no antibiotics, minimizing of medications, comfort feeds, and medication for pain and dyspnea as needed.   Discussed prognosis appeared to be days at this point. Discussed potential for transfer to residential hospice if patient remained stable. [name] Bell is agreeable to this - would prefer [facility]. She looks forward to being able to visit her friend.   Questions and concerns were addressed. [name] Bell was encouraged to call with questions or concerns.    Primary decision maker: [name] Bell     SUMMARY OF RECOMMENDATIONS    Full comfort measures  DNR/DNI as previously documented  Transfer to [facility] when bed becomes available -  TOC consult placed;  hospice liaison notifed  Provide frequent assessments and administer PRN medications as clinically necessary to ensure EOL comfort  PMT will continue to follow holistically  Code Status/Advance Care Planning:  DNR  Symptom Management:   Morphine prn for pain or dyspnea  Lorazepam (ATIVAN) prn for anxiety  Haloperidol (HALDOL) prn for agitation   Glycopyrrolate (ROBINUL) for excessive secretions  Ondansetron (ZOFRAN) prn for nausea  Polyvinyl alcohol (LIQUIFILM TEARS) prn for dry eyes  Antiseptic oral rinse (BIOTENE) prn for dry mouth   Palliative Prophylaxis:   Turn/reposition, mouth care  Additional  Recommendations (Limitations, Scope, Preferences):  Full comfort measures  Prognosis:   days  Discharge Planning: residential hospice      Primary Diagnoses: Present on Admission: . Septic shock due to undetermined organism ([place]) . COPD (chronic obstructive pulmonary disease) ([place]) . Back pain, chronic . Malnutrition of moderate degree . Frailty . Aspiration pneumonia ([place])   I have reviewed the medical record, interviewed the patient and family, and examined the patient. The following aspects are pertinent.  Past Medical History:  Diagnosis Date  . Abnormal chest x-ray 03/29/2018   [facility] PCXR 2/27& 03/28/2018 R perihilar/upper lobe ill-defined infiltrate suggested on the second film. WBC normal with minimal left shift.  Suggestion of R CPA blunting & possible effusion LLL. Rhonchi & wheezing ; O2 sats < 90%.  Purulent sputum was described to her by hospital staff.Rx for Augmentin,steroids, nebs 3/13 mobile imaging PCXR: RUL infiltrate essentially resolved; new RLL oval infiltra  . Acute sinusitis 06/30/2011  . Anemia   . Anxiety   . Basal cell carcinoma    "left cheek"  . Bleeding ulcer   . Blind in both eyes   . Chronic back pain   . Degenerative disk disease    This is interscapular, mild compression frx of T12 superior endplate with Schmorl's node. Seen on CT in 2/08  . Depression   . Duodenal ulcer 11/08   With hemorrhage and obstruction  . Hair loss 06/24/2016  . History of blood transfusion    "related to bleeding from intestines and vomiting blood"  . Ischemic colitis ([place]) 07/30/2011   2009 by endoscopy   . Macular degeneration, wet ([place])   . Osteomyelitis, jaw acute 11/08   Started while in the hospital abcess showed GNR . SP debridement by Dr. Lilli Few,  Priscella Mann S Bovis sp 4  weeks of Pen V started on 05/19/07  with additional 2 weeks in 07/04/07  . Pain syndrome, chronic   . Perforated bowel [facility])    surgery july 2013  . Pleurisy with pleural effusion 04/16/2018   See  04/14/2018 SNF notes Lovenox changed to Eliquis because of pleuritic pain, persistent left lower extremity pain, and possible hemoptysis. There was a dramatic elevation of d-dimer with a value of 2320  . Sinus tachycardia 09/11/2015  . Superior mesenteric artery syndrome ([facility]) 11/08   sp dilation by Dr. Watt Climes, Pt may require bowel resetion if sx recur    Family History  Problem Relation Age of Onset  . Cancer Mother        Lung  . Heart disease Father   . Diabetes Father   . Diabetes Sister    Scheduled Meds: . sodium chloride flush  3 mL Intravenous Q12H   Continuous Infusions: PRN Meds:.acetaminophen **OR** acetaminophen, antiseptic oral rinse, diphenhydrAMINE, glycopyrrolate **OR** glycopyrrolate **OR** glycopyrrolate, haloperidol **OR** haloperidol **OR** haloperidol lactate, LORazepam **OR** LORazepam **OR** LORazepam, morphine injection, ondansetron **OR** ondansetron (ZOFRAN) IV,  polyvinyl alcohol Medications Prior to Admission:  Prior to Admission medications   Medication Sig Start Date End Date Taking? Authorizing Provider  acetaminophen (TYLENOL) 650 MG CR tablet Take 650 mg by mouth See admin instructions. Tid x 5 days    [provider]  albuterol (VENTOLIN HFA) 108 (90 Base) MCG/ACT inhaler TAKE 2 PUFFS BY MOUTH EVERY 6 HOURS AS NEEDED FOR WHEEZE OR SHORTNESS OF BREATH Patient taking differently: Inhale 2 puffs into the lungs every 6 (six) hours as needed for shortness of breath or wheezing. 04/04/19   Mosetta Anis, MD  bisacodyl (DULCOLAX) 10 MG suppository Place 10 mg rectally See admin instructions. As needed if no relief from milk of mag  for constipation  give 1 dose in 24 hours.    [provider]  budesonide-formoterol (SYMBICORT) 160-4.5 MCG/ACT inhaler Inhale 2 puffs into the lungs 2 (two) times daily.    [provider]  calcium carbonate (TUMS EX) 750 MG chewable tablet Chew 2 tablets by mouth every 4 (four) hours as needed for heartburn.     [provider]  cyclobenzaprine (FLEXERIL) 5 MG tablet Take 1 tablet (5 mg total) by mouth 2 (two) times daily as needed for muscle spasms. 04/16/20   Manuella Ghazi, Pratik D, DO  docusate sodium (COLACE) 100 MG capsule Take 1 capsule (100 mg total) by mouth 2 (two) times daily. To prevent constipation while taking pain medication. 03/24/18   Prudencio Burly III, PA-C  DULoxetine (CYMBALTA) 60 MG capsule Take 60 mg by mouth daily.    [provider]  famotidine (PEPCID) 20 MG tablet Take 20 mg by mouth daily.    [provider]  feeding supplement (BOOST / RESOURCE BREEZE) LIQD Take 1 Container by mouth 2 (two) times daily.    [provider]  fluticasone (FLONASE) 50 MCG/ACT nasal spray Place 2 sprays into both nostrils daily as needed for allergies or rhinitis.    [provider]  furosemide (LASIX) 20 MG tablet Take 20 mg by mouth daily.    [provider]  guaiFENesin (MUCINEX) 600 MG 12 hr tablet Take 600 mg by mouth 2 (two) times daily.    [provider]  Ipratropium-Albuterol (COMBIVENT RESPIMAT) 20-100 MCG/ACT AERS respimat Inhale 1 puff into the lungs every 6 (six) hours as needed for wheezing or shortness of breath.    [provider]  magnesium hydroxide (MILK OF MAGNESIA) 400 MG/5ML suspension Take 30 mLs by mouth See admin instructions. As needed if no BM in 3 days x 1 dose    [provider]  metoprolol succinate (TOPROL-XL) 25 MG 24 hr tablet Take 25 mg by mouth daily.    [provider]  morphine ([name] CONTIN) 15 MG 12 hr tablet Take 1 tablet (15 mg total) by mouth every 12 (twelve) hours as needed for pain. 04/16/20   Manuella Ghazi, Pratik D, DO  Multiple Vitamin (MULTIVITAMIN WITH MINERALS) TABS tablet Take 1 tablet by mouth daily.    [provider]  nystatin (MYCOSTATIN/NYSTOP) powder Apply 1 application topically in the morning, at noon, in the evening, and at bedtime. Bilateral axilla and abd.  folds    [provider]  olopatadine (PATANOL) 0.1 % ophthalmic solution Place 1 drop into both eyes 2 (two) times daily.    [provider]  OXYGEN Inhale 2 L/min into the lungs continuous.    [provider]  potassium chloride (KLOR-CON) 10 MEQ tablet Take 20 mEq by mouth daily.  01/06/20   [provider]  pravastatin (PRAVACHOL) 40 MG tablet TAKE 1 TABLET BY MOUTH EVERY DAY Patient taking differently: Take 40 mg by mouth daily. 01/13/19   Mosetta Anis, MD  saccharomyces boulardii (FLORASTOR) 250 MG capsule Take 250 mg by mouth daily.    [provider]  Sodium Phosphates (ENEMA RE) Place 1 application rectally See admin instructions. Give as needed for constipation if no relief from milk of mag or bisacodyl. 1 dose per 24 hours    [provider]  spironolactone (ALDACTONE) 25 MG tablet Take 25 mg by mouth daily.    [provider]  Vitamin D, Ergocalciferol, (DRISDOL) 1.25 MG (50000 UNIT) CAPS capsule Take 50,000 Units by mouth every 30 (thirty) days. Take on the 21st of the month    [provider]  sucralfate (CARAFATE) 1 g tablet Take 1 tablet (1 g total) by mouth 4 (four) times daily -  with meals and at bedtime. 07/03/19 07/03/19  Varney Biles, MD   Allergies  Allergen Reactions  . Strawberry (Diagnostic) Rash  . Duloxetine Nausea And Vomiting  . Ibuprofen Other (See Comments)    REACTION: Bleeding ulcers  . Nsaids Other (See Comments)    REACTION: Bleeding Ulcer  . Latex Itching   Review of Systems  Unable to perform ROS: Mental status change    Physical Exam Vitals reviewed.  Constitutional:      General: She is not in acute distress.    Appearance: She is ill-appearing.  Cardiovascular:     Rate and Rhythm: Tachycardia present.  Pulmonary:     Effort: Tachypnea present.  Neurological:     Mental Status: She is lethargic.     Motor: Weakness present.     Vital Signs: BP (!) 114/41   Pulse  (!) 112   Temp 98.3 F (36.8 C)   Resp (!) 31   Ht 5\' 4"  (1.626 m)   Wt 59 kg   LMP 08/25/1980   SpO2 100%   BMI 22.33 kg/m  Pain Scale: Faces     SpO2: SpO2: 100 % O2 Device:SpO2: 100 % O2 Flow Rate: .O2 Flow Rate (L/min): 2 L/min  IO: Intake/output summary:   Intake/Output Summary (Last 24 hours) at 05/23/2020 1511 Last data filed at 05/23/2020 1400 Gross per 24 hour  Intake 1252.83 ml  Output --  Net 1252.83 ml    LBM:   Baseline Weight: Weight: 59 kg Most recent weight: Weight: 59 kg      Palliative Assessment/Data: PPS 10-20%     Time In: 1500 Time Out: 1553 Time Total: 53 minutes Greater than 50%  of this time was spent counseling and coordinating care related to the above assessment and plan.  Signed by: Lavena Bullion, NP   Please contact Palliative Medicine Team phone at [phone] for questions and concerns.  For individual provider: See Shea Evans

## 2020-05-23 NOTE — ED Triage Notes (Signed)
Pt arrived via ems from [place] facility. Facility reports pt is not breathing like she regularly does. Pt is at [facility] due to a fractured femur. EMS reports pt was on 88% on 6 l [place] on arrival to the facility. Pt arrived on nrb 15 l and pts sats are at 100. Pt appears very lethargic. EMS reports vitals were stable. Pt does have an ostomy, and ems reports it has been leaking.Pt is DNR but facility did not allow ems to take form, they stated they would fax it over.

## 2020-05-23 NOTE — ED Notes (Signed)
Paged Lorin Mercy MD

## 2020-05-23 NOTE — Consult Note (Signed)
[place] Nurse ostomy consult note Stoma type/location: LLQ colostomy  Established over a year ago  Presented to ED with a urinary pouch on that became full and has completely blown off upon my assessment.,  Stomal assessment/size: 1 3/4" flush stoma pink and moist  Producing thick brown stool/.  Peristomal assessment: Medical adhesive related skin injury to distal border of pouching area. In abdominal pannus. Treatment options for stomal/peristomal skin: Avoid area of MARSI when pouching.  Barrier ring and 2 piece pouch to colostomy Output thick brown stool  Consistent with constipation Ostomy pouching: 2pc. 2 3/4" pouch with barrier ring Education provided: Patient not arousable. Snoring during care Enrolled patient in [place] program: NA  Arrived with nonhollister product on.  Will not follow at this time.  Please re-consult if needed.  Domenic Moras MSN, RN, FNP-BC CWON Wound, Ostomy, Continence Nurse Pager [phone]

## 2020-05-23 NOTE — ED Notes (Signed)
[place] MD aware of low BP's w/ MAPs in the 50s.
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
## 2020-05-23 NOTE — ED Notes (Signed)
Pt able to tolerate small sips of ginger ale from spoon.

## 2020-05-23 NOTE — ED Notes (Signed)
Cannot obtain rectal temp, pts rectum sewn shut, md horton aware

## 2020-05-23 NOTE — Progress Notes (Signed)
Pharmacy Antibiotic Note  [name] Bell is a 75 y.o. female admitted on 05/23/2020 with pneumonia. Pharmacy has been consulted for vancomycin and cefepime dosing. Pt is afebrile but WBC is elevated at 18.5. SCr is WNL and lactic acid is elevated at 3.   Plan: Cefepime 2gm IV Q12H Vancomycin 1gm IV Q24H  F/u renal fxn, C&S, clinical status and peak/trough at SS  Height: 5\' 4"  (162.6 cm) Weight: 59 kg (130 lb 1.1 oz) IBW/kg (Calculated) : 54.7  Temp (24hrs), Avg:98.4 F (36.9 C), Min:98 F (36.7 C), Max:99.2 F (37.3 C)  Recent Labs  Lab 05/23/20 0618 05/23/20 0950  WBC 18.5*  --   CREATININE 0.74  --   LATICACIDVEN  --  3.0*    Estimated Creatinine Clearance: 51.7 mL/min (by C-G formula based on SCr of 0.74 mg/dL).    Allergies  Allergen Reactions  . Strawberry (Diagnostic) Rash  . Duloxetine Nausea And Vomiting  . Ibuprofen Other (See Comments)    REACTION: Bleeding ulcers  . Nsaids Other (See Comments)    REACTION: Bleeding Ulcer  . Latex Itching    Antimicrobials this admission: Vanc 4/28>> Cefepime 4/28>>  Dose adjustments this admission: N/A  Microbiology results: Pending  Thank you for allowing pharmacy to be a part of this patient's care.  Cylinda Santoli, Rande Lawman 05/23/2020 12:33 PM
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
## 2020-05-24 DIAGNOSIS — E440 Moderate protein-calorie malnutrition: Secondary | ICD-10-CM

## 2020-05-24 DIAGNOSIS — R6521 Severe sepsis with septic shock: Secondary | ICD-10-CM | POA: Diagnosis not present

## 2020-05-24 DIAGNOSIS — J9621 Acute and chronic respiratory failure with hypoxia: Secondary | ICD-10-CM | POA: Diagnosis not present

## 2020-05-24 DIAGNOSIS — J418 Mixed simple and mucopurulent chronic bronchitis: Secondary | ICD-10-CM

## 2020-05-24 DIAGNOSIS — A419 Sepsis, unspecified organism: Secondary | ICD-10-CM | POA: Diagnosis not present

## 2020-05-24 DIAGNOSIS — J189 Pneumonia, unspecified organism: Secondary | ICD-10-CM | POA: Diagnosis not present

## 2020-05-24 MED ORDER — HALOPERIDOL 0.5 MG PO TABS: 0.5000 mg | ORAL_TABLET | Freq: Four times a day (QID) | ORAL | 0 refills | Status: AC | PRN

## 2020-05-24 MED ORDER — LORAZEPAM 2 MG/ML PO CONC: 1.0000 mg | ORAL | 0 refills | Status: AC | PRN

## 2020-05-24 MED ORDER — GLYCOPYRROLATE 1 MG PO TABS: 1.0000 mg | ORAL_TABLET | ORAL | 0 refills | Status: DC | PRN

## 2020-05-24 NOTE — Progress Notes (Addendum)
Report given to Best boy at [facility] facility. Nurse requested to keep her PIV

## 2020-05-24 NOTE — Progress Notes (Signed)
Daily Progress Note   Patient Name: [name] Bell       Date: 05/24/2020 DOB: Sep 27, 1945  Age: 75 y.o. MRN#: [id number] Attending Physician: Terrilee Croak, MD Primary Care Physician: Hendricks Limes, MD Admit Date: 05/23/2020  Reason for Consultation/Follow-up: end of life care, symptom management  Subjective: Patient is alert, but remains confused. She states to me "I'm scared". I asked her about about what and she replies "having surgery". I provided reassurance that she is not having any type of surgery or procedure. She does reports shortness of breath - I have asked her bedside RN to administer a dose of morphine.    Length of Stay: 1  Current Medications: Scheduled Meds:  . sodium chloride flush  3 mL Intravenous Q12H     PRN Meds: acetaminophen **OR** acetaminophen, antiseptic oral rinse, diphenhydrAMINE, glycopyrrolate **OR** glycopyrrolate **OR** glycopyrrolate, haloperidol **OR** haloperidol **OR** haloperidol lactate, LORazepam **OR** LORazepam **OR** LORazepam, morphine injection, ondansetron **OR** ondansetron (ZOFRAN) IV, polyvinyl alcohol     Vital Signs: BP (!) 114/49 (BP Location: Right Arm)   Pulse (!) 120   Temp 99.5 F (37.5 C) (Oral)   Resp 18   Ht 5\' 4"  (1.626 m)   Wt 59 kg   LMP 08/25/1980   SpO2 95%   BMI 22.33 kg/m  SpO2: SpO2: 95 % O2 Device: O2 Device: Nasal Cannula O2 Flow Rate: O2 Flow Rate (L/min): 2 L/min  Intake/output summary:   Intake/Output Summary (Last 24 hours) at 05/24/2020 1533 Last data filed at 05/24/2020 1300 Gross per 24 hour  Intake 20 ml  Output 300 ml  Net -280 ml   LBM:   Baseline Weight: Weight: 59 kg Most recent weight: Weight: 59 kg       Palliative Assessment/Data: PPS 20%       Palliative Care  Assessment & Plan   HPI/Patient Profile: 75 y.o. female  with past medical history of chronic diastolic CHF, COPD on chronic home oxygen, small bowel perforation with resultant colostomy, visual impairment, colitis/ulcer, chronic pain, HTN, and HLD.  She presented to the emergency department on 05/23/2020 with respiratory distress. Spo2 88% on 6L O2 by EMS and increased to NRB. She has known history of aspiration.  ED Course: BP decreased as low as 78/64, improved with IV fluid. Chest x-ray shows recurrent left  mid and lower lung zone pulmonary infiltrate, suspicious for infection or aspiration. Lactic acid 3.0, Albumin 2.8  Assessment: - septic shock - aspiration pneumonia - dysphagia - COPD - chronic diastolic CHF  Recommendations/Plan:  Continue full comfort care  DNR/DNI as previously documented  Pending discharge back to [facility]  Recommend hospice to follow at SNF  Goals of Care and Additional Recommendations:  Limitations on Scope of Treatment: Full Comfort Care  Code Status: DNR/DNI  Prognosis:   weeks   Discharge Planning:  [place] with Hospice  Care plan was discussed with attending MD and [facility]  Thank you for allowing the Palliative Medicine Team to assist in the care of this patient.   Total Time 15 minutes Prolonged Time Billed  no       Greater than 50%  of this time was spent counseling and coordinating care related to the above assessment and plan.  Lavena Bullion, NP  Please contact Palliative Medicine Team phone at [phone] for questions and concerns.

## 2020-05-24 NOTE — Plan of Care (Signed)

## 2020-05-24 NOTE — Discharge Summary (Addendum)
Physician Discharge Summary  LITTLE BASHORE UKG:[id number] DOB: 09/29/1945 DOA: 05/23/2020  PCP: Hendricks Limes, MD  Admit date: 05/23/2020 Discharge date: 05/24/2020  Admitted From: [facility]   Code Status: DNR  Diet Recommendation: As tolerated  Discharge Diagnosis:   Principal Problem:   Septic shock due to undetermined organism [facility]) Active Problems:   Long term (current) use of opiate analgesic   Colostomy present ([place])   COPD (chronic obstructive pulmonary disease) ([place])   End of life care   Back pain, chronic   Malnutrition of moderate degree   Frailty   Aspiration pneumonia ([place])   History of Present Illness / Brief narrative:  Patient is a 75 y.o. female  with past medical history of chronic diastolic CHF, COPD on chronic home oxygen, small bowel perforation with resultant colostomy, visual impairment, colitis/ulcer, chronic pain, HTN, and HLD.  She presented to the emergency department on 05/23/2020 with respiratory distress.  Spo2 88% on 6L O2 by EMS and increased to NRB. She has known history of aspiration.   ED Course: BP decreased as low as 78/64, improved with IV fluid. Chest x-ray shows recurrent left mid and lower lung zone pulmonary infiltrate, suspicious for infection or aspiration. Lactic acid 3.0, Albumin 2.8  Patient was admitted to hospitalist service for septic shock due to aspiration pneumonia Palliative care consultation was obtained. See below for details  Subjective:  Seen and examined this morning.  Elderly Caucasian female.  Opens eyes on verbal command.  Lethargic.  Not in distress.  Hospital Course:  Septic shock due to aspiration pneumonia Chronic dysphagia Acute Metabolic encephalopathy Comfort care status -Patient is already a DNR/DNI and comfort care status at the facility.  She actually had a 'do not hospitalize' wish.  I am not sure how that was not communicated among the staff at the  facility and she was still sent to the hospital. -Later on last night, the patient at the facility called the hospital and requested discharge back to the facility -At this time, we will discharge the patient back to [facility] -Continue comfort care measures with as needed morphine, Ativan, Haldol, Robinul, Zofran,  Wound care: Pressure Injury 05/01/19 Coccyx Mid Stage 1 -  Intact skin with non-blanchable redness of a localized area usually over a bony prominence. (Active)  Date First Assessed/Time First Assessed: 05/01/19 1110   Location: Coccyx  Location Orientation: Mid  Staging: Stage 1 -  Intact skin with non-blanchable redness of a localized area usually over a bony prominence.    Assessments 05/01/2019 11:10 AM 05/02/2019  9:48 AM  Site / Wound Assessment Clean;Dry Clean;Dry  Drainage Amount None None  Treatment Cleansed;Off loading --     No Linked orders to display     Pressure Injury 04/13/20 Thigh Anterior;Left;Proximal Stage 1 -  Intact skin with non-blanchable redness of a localized area usually over a bony prominence. Moderate areas of non-blanchable redness correlating with zipper on colostomy appliance hitting h (Active)  Date First Assessed/Time First Assessed: 04/13/20 1717   Location: Thigh  Location Orientation: Anterior;Left;Proximal  Staging: Stage 1 -  Intact skin with non-blanchable redness of a localized area usually over a bony prominence.  Wound Description ...    Assessments 04/13/2020  5:17 PM 04/16/2020  7:47 AM  Dressing Type Foam - Lift dressing to assess site every shift Foam - Lift dressing to assess site every shift  Dressing Clean;Dry;Intact Clean;Dry;Intact  Dressing Change Frequency PRN PRN  State of  Healing Epithelialized Epithelialized  Site / Wound Assessment Clean;Dry;Red;Pink Clean;Dry  Peri-wound Assessment Intact;Pink Intact;Pink  Drainage Amount None None     No Linked orders to display    Discharge Exam:   Vitals:   05/23/20 1830 05/23/20  1900 05/23/20 1935 05/24/20 0425  BP: (!) 84/47 (!) 97/49 (!) 89/36 (!) 114/49  Pulse: (!) 103 (!) 110 (!) 111 (!) 120  Resp: (!) 24 (!) 23 20 18   Temp:   97.9 F (36.6 C) 99.5 F (37.5 C)  TempSrc:    Oral  SpO2: 100% 100% 91% 95%  Weight:      Height:        Body mass index is 22.33 kg/m.  General exam: Pleasant elderly Caucasian female.  Not in distress Skin: No rashes, lesions or ulcers. HEENT: Atraumatic, normocephalic, no obvious bleeding Lungs: Diminished air entry bilaterally CVS: Tachycardic GI/Abd soft, nontender, nondistended, bowel sound present CNS: Sleeping, opens eyes on verbal command, slow to respond, lethargic Psychiatry: Depressed look Extremities: No pedal edema, no calf tenderness  Follow ups:   Discharge Instructions    Increase activity slowly   Complete by: As directed       Follow-up Information    Hendricks Limes, MD Follow up.   Specialty: Internal Medicine Contact information: [place] [address] [phone]               Recommendations for Outpatient Follow-Up:   1. Per comfort care policy at the facility  Discharge Instructions:  Follow with Primary MD Hendricks Limes, MD in 7 days  Per comfort care policy at the facility  Allergies as of 05/24/2020      Reactions   Strawberry (diagnostic) Rash   Duloxetine Nausea And Vomiting   Ibuprofen Other (See Comments)   REACTION: Bleeding ulcers   Nsaids Other (See Comments)   REACTION: Bleeding Ulcer   Latex Itching      Medication List    TAKE these medications   acetaminophen 650 MG CR tablet Commonly known as: TYLENOL Take 650 mg by mouth See admin instructions. Tid x 5 days   albuterol 108 (90 Base) MCG/ACT inhaler Commonly known as: VENTOLIN HFA TAKE 2 PUFFS BY MOUTH EVERY 6 HOURS AS NEEDED FOR WHEEZE OR SHORTNESS OF BREATH What changed: See the new instructions.   bisacodyl 10 MG suppository Commonly known as: DULCOLAX Place 10  mg rectally See admin instructions. As needed if no relief from milk of mag  for constipation  give 1 dose in 24 hours.   budesonide-formoterol 160-4.5 MCG/ACT inhaler Commonly known as: SYMBICORT Inhale 2 puffs into the lungs 2 (two) times daily.   calcium carbonate 750 MG chewable tablet Commonly known as: TUMS EX Chew 2 tablets by mouth every 4 (four) hours as needed for heartburn.   Combivent Respimat 20-100 MCG/ACT Aers respimat Generic drug: Ipratropium-Albuterol Inhale 1 puff into the lungs every 6 (six) hours as needed for wheezing or shortness of breath.   cyclobenzaprine 5 MG tablet Commonly known as: FLEXERIL Take 1 tablet (5 mg total) by mouth 2 (two) times daily as needed for muscle spasms.   docusate sodium 100 MG capsule Commonly known as: Colace Take 1 capsule (100 mg total) by mouth 2 (two) times daily. To prevent constipation while taking pain medication.   DULoxetine 60 MG capsule Commonly known as: CYMBALTA Take 60 mg by mouth daily.   ENEMA RE Place 1 application rectally See admin instructions. Give as needed for  constipation if no relief from milk of mag or bisacodyl. 1 dose per 24 hours   famotidine 20 MG tablet Commonly known as: PEPCID Take 20 mg by mouth daily.   feeding supplement Liqd Commonly known as: BOOST / RESOURCE BREEZE Take 1 Container by mouth 2 (two) times daily.   fluticasone 50 MCG/ACT nasal spray Commonly known as: FLONASE Place 2 sprays into both nostrils daily as needed for allergies or rhinitis.   furosemide 20 MG tablet Commonly known as: LASIX Take 20 mg by mouth daily.   glycopyrrolate 1 MG tablet Commonly known as: ROBINUL Take 1 tablet (1 mg total) by mouth every 4 (four) hours as needed for up to 7 days (excessive secretions).   guaiFENesin 600 MG 12 hr tablet Commonly known as: MUCINEX Take 600 mg by mouth 2 (two) times daily.   haloperidol 0.5 MG tablet Commonly known as: HALDOL Take 1 tablet (0.5 mg total) by  mouth every 6 (six) hours as needed for up to 5 days for agitation (or delirium).   LORazepam 2 MG/ML concentrated solution Commonly known as: ATIVAN Place 0.5 mLs (1 mg total) under the tongue every 4 (four) hours as needed for anxiety.   magnesium hydroxide 400 MG/5ML suspension Commonly known as: MILK OF MAGNESIA Take 30 mLs by mouth See admin instructions. As needed if no BM in 3 days x 1 dose   metoprolol succinate 25 MG 24 hr tablet Commonly known as: TOPROL-XL Take 25 mg by mouth daily.   morphine 15 MG 12 hr tablet Commonly known as: MS CONTIN Take 1 tablet (15 mg total) by mouth every 12 (twelve) hours as needed for pain.   multivitamin with minerals Tabs tablet Take 1 tablet by mouth daily.   nystatin powder Commonly known as: MYCOSTATIN/NYSTOP Apply 1 application topically in the morning, at noon, in the evening, and at bedtime. Bilateral axilla and abd. folds   olopatadine 0.1 % ophthalmic solution Commonly known as: PATANOL Place 1 drop into both eyes 2 (two) times daily.   OXYGEN Inhale 2 L/min into the lungs continuous.   potassium chloride 10 MEQ tablet Commonly known as: KLOR-CON Take 20 mEq by mouth daily.   pravastatin 40 MG tablet Commonly known as: PRAVACHOL TAKE 1 TABLET BY MOUTH EVERY DAY   saccharomyces boulardii 250 MG capsule Commonly known as: FLORASTOR Take 250 mg by mouth daily.   spironolactone 25 MG tablet Commonly known as: ALDACTONE Take 25 mg by mouth daily.   Vitamin D (Ergocalciferol) 1.25 MG (50000 UNIT) Caps capsule Commonly known as: DRISDOL Take 50,000 Units by mouth every 30 (thirty) days. Take on the 21st of the month       Time coordinating discharge: 35 minutes  The results of significant diagnostics from this hospitalization (including imaging, microbiology, ancillary and laboratory) are listed below for reference.    Procedures and Diagnostic Studies:   CT ABDOMEN PELVIS W CONTRAST  Result Date:  05/23/2020 CLINICAL DATA:  Abdominal pain. EXAM: CT ABDOMEN AND PELVIS WITH CONTRAST TECHNIQUE: Multidetector CT imaging of the abdomen and pelvis was performed using the standard protocol following bolus administration of intravenous contrast. CONTRAST:  67mL OMNIPAQUE IOHEXOL 300 MG/ML  SOLN COMPARISON:  April 19, 2019 CT angiogram and pelvis; CT angiogram chest April 12, 2020 FINDINGS: Lower chest: There is persistent airspace consolidation with small pleural effusion in the posterior left base. There is atelectatic change in the right base. There are foci of coronary artery calcification. Hepatobiliary: No focal liver lesions  are apparent. Gallbladder is mildly distended. There is cholelithiasis. The gallbladder wall does not appear appreciably thickened. There is no biliary duct dilatation. Pancreas: There is no pancreatic mass or inflammatory focus. Spleen: No splenic lesions are evident. Adrenals/Urinary Tract: Adrenals bilaterally appear normal. There is no evident renal mass or hydronephrosis on either side. There is a 2 mm calculus in the lower pole left kidney. There is no evident ureteral calculus on either side. Urinary bladder is midline with wall thickness within normal limits. Stomach/Bowel: There is a colostomy anteriorly on the left in the upper pelvic region which appears patent. There are herniated large and small bowel loops through the ostomy defect, a finding also present previously. No evident bowel compromise in this area. There is no appreciable bowel wall thickening. No bowel obstruction. No free air or portal venous air. No periappendiceal region inflammatory change. Appendix not seen. Vascular/Lymphatic: There is no abdominal aortic aneurysm. There is aortic and iliac artery atherosclerosis. Major venous structures appear patent. No evident adenopathy in the abdomen or pelvis. Reproductive: Uterus absent.  No adnexal masses. Other: No abscess or ascites appreciable in the abdomen or  pelvis. Musculoskeletal: There is lumbar levoscoliosis. There is stable endplate concavity along the superior aspect of L1. No blastic or lytic bone lesions. No intramuscular lesions are evident. IMPRESSION: 1. Gallbladder is mildly distended with cholelithiasis. No gallbladder wall thickening seen by CT. 2. Left lower quadrant ostomy with herniated loops of small and large bowel within the ostomy, similar in appearance compared to previous study. No bowel compromise evident. 3.  No bowel obstruction.  No abscess in the abdomen or pelvis. 4. Persistent posterior left lower lobe airspace consolidation with small left pleural effusion. Posterior right base atelectasis noted. 5. Aortic Atherosclerosis (ICD10-I70.0). There also foci of coronary artery and iliac artery atherosclerotic calcification. 6. Nonobstructing 2 mm calculus lower pole left kidney. No hydronephrosis or ureteral calculus on either side. Urinary bladder wall thickness normal. 7.  Uterus absent. Electronically Signed   By: Lowella Grip III M.D.   On: 05/23/2020 08:15   DG Chest Port 1 View  Result Date: 05/23/2020 CLINICAL DATA:  Sepsis EXAM: PORTABLE CHEST 1 VIEW COMPARISON:  04/16/2020 FINDINGS: Slight interval progression of left mid and lower lung zone focal pulmonary infiltrate possibly related to underlying infection or aspiration. Right lung is clear. No pneumothorax. No pleural effusion. Cardiac size within normal limits. Pulmonary vascularity is normal. IMPRESSION: Recurrent left mid and lower lung zone pulmonary infiltrate, suspicious for infection or aspiration. Electronically Signed   By: Fidela Salisbury MD   On: 05/23/2020 06:15     Labs:   Basic Metabolic Panel: Recent Labs  Lab 05/23/20 0618 05/23/20 0645  NA 135 135  K 3.5 3.2*  CL 94*  --   CO2 27  --   GLUCOSE 120*  --   BUN 7*  --   CREATININE 0.74  --   CALCIUM 9.2  --    GFR Estimated Creatinine Clearance: 51.7 mL/min (by C-G formula based on SCr of  0.74 mg/dL). Liver Function Tests: Recent Labs  Lab 05/23/20 0618  AST 23  ALT 13  ALKPHOS 150*  BILITOT 1.0  PROT 6.2*  ALBUMIN 2.8*   No results for input(s): LIPASE, AMYLASE in the last 168 hours. No results for input(s): AMMONIA in the last 168 hours. Coagulation profile Recent Labs  Lab 05/23/20 0618  INR 1.0    CBC: Recent Labs  Lab 05/23/20 0618 05/23/20 0645  WBC  18.5*  --   NEUTROABS 15.5*  --   HGB 16.2* 16.0*  HCT 50.9* 47.0*  MCV 88.8  --   PLT 351  --    Cardiac Enzymes: No results for input(s): CKTOTAL, CKMB, CKMBINDEX, TROPONINI in the last 168 hours. BNP: Invalid input(s): POCBNP CBG: No results for input(s): GLUCAP in the last 168 hours. D-Dimer No results for input(s): DDIMER in the last 72 hours. Hgb A1c No results for input(s): HGBA1C in the last 72 hours. Lipid Profile No results for input(s): CHOL, HDL, LDLCALC, TRIG, CHOLHDL, LDLDIRECT in the last 72 hours. Thyroid function studies No results for input(s): TSH, T4TOTAL, T3FREE, THYROIDAB in the last 72 hours.  Invalid input(s): FREET3 Anemia work up No results for input(s): VITAMINB12, FOLATE, FERRITIN, TIBC, IRON, RETICCTPCT in the last 72 hours. Microbiology Recent Results (from the past 240 hour(s))  Resp Panel by RT-PCR (Flu A&B, Covid) Nasopharyngeal Swab     Status: None   Collection Time: 05/23/20  6:01 AM   Specimen: Nasopharyngeal Swab; Nasopharyngeal(NP) swabs in vial transport medium  Result Value Ref Range Status   SARS Coronavirus 2 by RT PCR NEGATIVE NEGATIVE Final    Comment: (NOTE) SARS-CoV-2 target nucleic acids are NOT DETECTED.  The SARS-CoV-2 RNA is generally detectable in upper respiratory specimens during the acute phase of infection. The lowest concentration of SARS-CoV-2 viral copies this assay can detect is 138 copies/mL. A negative result does not preclude SARS-Cov-2 infection and should not be used as the sole basis for treatment or other patient  management decisions. A negative result may occur with  improper specimen collection/handling, submission of specimen other than nasopharyngeal swab, presence of viral mutation(s) within the areas targeted by this assay, and inadequate number of viral copies(<138 copies/mL). A negative result must be combined with clinical observations, patient history, and epidemiological information. The expected result is Negative.  Fact Sheet for Patients:  EntrepreneurPulse.com.au  Fact Sheet for Healthcare Providers:  IncredibleEmployment.be  This test is no t yet approved or cleared by the [place] FDA and  has been authorized for detection and/or diagnosis of SARS-CoV-2 by FDA under an Emergency Use Authorization (EUA). This EUA will remain  in effect (meaning this test can be used) for the duration of the COVID-19 declaration under Section 564(b)(1) of the Act, 21 U.S.C.section 360bbb-3(b)(1), unless the authorization is terminated  or revoked sooner.       Influenza A by PCR NEGATIVE NEGATIVE Final   Influenza B by PCR NEGATIVE NEGATIVE Final    Comment: (NOTE) The Xpert Xpress SARS-CoV-2/FLU/RSV plus assay is intended as an aid in the diagnosis of influenza from Nasopharyngeal swab specimens and should not be used as a sole basis for treatment. Nasal washings and aspirates are unacceptable for Xpert Xpress SARS-CoV-2/FLU/RSV testing.  Fact Sheet for Patients: EntrepreneurPulse.com.au  Fact Sheet for Healthcare Providers: IncredibleEmployment.be  This test is not yet approved or cleared by the [place] FDA and has been authorized for detection and/or diagnosis of SARS-CoV-2 by FDA under an Emergency Use Authorization (EUA). This EUA will remain in effect (meaning this test can be used) for the duration of the COVID-19 declaration under Section 564(b)(1) of the Act, 21 U.S.C. section 360bbb-3(b)(1),  unless the authorization is terminated or revoked.  Performed at [facility], [place] [address], [place], [place] [address]   Blood culture (routine single)     Status: None (Preliminary result)   Collection Time: 05/23/20  6:19 AM   Specimen: BLOOD  Result Value Ref Range Status   Specimen Description BLOOD RIGHT ANTECUBITAL  Final   Special Requests   Final    BOTTLES DRAWN AEROBIC AND ANAEROBIC Blood Culture results may not be optimal due to an inadequate volume of blood received in culture bottles   Culture   Final    NO GROWTH 1 DAY Performed at [facility] [address], [place], [place] [address]    Report Status PENDING  Incomplete     Signed: Marlowe Aschoff Khyre Germond  [facility] 05/24/2020, 10:18 AM

## 2020-05-24 NOTE — [place] FL2 (Signed)
[place] MEDICAID FL2 LEVEL OF CARE SCREENING TOOL     IDENTIFICATION  Patient Name: [name] Bell Birthdate: 23-Jan-1946 Sex: female Admission Date (Current Location): 05/23/2020  [facility] and [place] Number:  Herbalist and Address:  [facility] [place], [place] [address] [place], [place] [address]      Provider Number: [id number]  Attending Physician Name and Address:  Terrilee Croak, MD  Relative Name and Phone Number:  [name] [phone]    Current Level of Care: Hospital Recommended Level of Care: Nursing Facility Prior Approval Number:    Date Approved/Denied:   PASRR Number: [id number]  Discharge Plan: Other (Comment) (long term care)    Current Diagnoses: Patient Active Problem List   Diagnosis Date Noted  . Septic shock due to undetermined organism ([place]) 05/23/2020  . Pressure injury of skin 04/14/2020  . Acute hypoxemic respiratory failure ([place]) 04/13/2020  . Aspiration pneumonia ([place]) 04/13/2020  . Severe sepsis ([place]) 04/13/2020  . Elevated troponin 04/13/2020  . CHF (congestive heart failure) ([place]) 04/13/2020  . Real time reverse transcriptase PCR positive for severe acute respiratory syndrome coronavirus 2 (SARS-CoV-2) 01/04/2020  . Hypotension 10/03/2019  . Chronic diastolic (congestive) heart failure ([place]) 07/11/2019  . Dyspnea 07/04/2019  . Pleural effusion 07/03/2019  . Chronic respiratory failure with hypoxia ([place]) 07/03/2019  . Opioid dependence with opioid-induced disorder ([place]) 05/11/2019  . FTT (failure to thrive) in adult 05/06/2019  . Symptomatic anemia 04/19/2019  . Frailty 04/19/2019  . External hemorrhoids without complication 86/76/1952  . Swelling of lower extremity 08/10/2018  . Pain due to onychomycosis of toenails of both feet 08/03/2018  . At risk for adverse drug reaction 03/29/2018  . Malnutrition of moderate degree 03/28/2018  . Duodenal ulcer disease 07/27/2017  . Hepatic steatosis 07/03/2017   . Aortic atherosclerosis ([place]) 06/04/2017  . Iron deficiency anemia 06/04/2017  . History of ischemic colitis 06/03/2017  . S/P sigmoid colectomy 06/03/2017  . Back pain, chronic 08/14/2016  . Hypokalemia 03/31/2016  . Sinus tachycardia 09/11/2015  . Macular degeneration 01/14/2012  . End of life care 01/14/2012  . COPD (chronic obstructive pulmonary disease) ([place]) 11/25/2011  . Colostomy present ([place]) 10/27/2011  . Hyperlipidemia 01/24/2009  . Long term (current) use of opiate analgesic 01/03/2009  . Major depression, chronic 03/08/2008  . Osteoporosis 03/03/2007    Orientation RESPIRATION BLADDER Height & Weight     Self  O2 (oxygen 2 l [employer]) Continent Weight: 59 kg Height:  5\' 4"  (162.6 cm)  BEHAVIORAL SYMPTOMS/MOOD NEUROLOGICAL BOWEL NUTRITION STATUS       (colostomy) Diet  AMBULATORY STATUS COMMUNICATION OF NEEDS Skin   Extensive Assist Verbally Normal                       Personal Care Assistance Level of Assistance  Total care       Total Care Assistance: Maximum assistance   Functional Limitations Info             SPECIAL CARE FACTORS FREQUENCY                       Contractures Contractures Info: Not present    Additional Factors Info  Code Status,Allergies Code Status Info: DNR Allergies Info: strawberry , Duloxetine, latex, ibuprofen, NSAIDS           Current Medications (05/24/2020):  This is the current hospital active medication list Current Facility-Administered Medications  Medication  Dose Route Frequency Provider Last Rate Last Admin  . acetaminophen (TYLENOL) tablet 650 mg  650 mg Oral Q6H PRN Karmen Bongo, MD       Or  . acetaminophen (TYLENOL) suppository 650 mg  650 mg Rectal Q6H PRN Karmen Bongo, MD      . antiseptic oral rinse (BIOTENE) solution 15 mL  15 mL Topical PRN Karmen Bongo, MD      . diphenhydrAMINE (BENADRYL) injection 12.5 mg  12.5 mg Intravenous Q4H PRN Karmen Bongo, MD      . glycopyrrolate  (ROBINUL) tablet 1 mg  1 mg Oral Q4H PRN Karmen Bongo, MD       Or  . glycopyrrolate (ROBINUL) injection 0.2 mg  0.2 mg Subcutaneous Q4H PRN Karmen Bongo, MD       Or  . glycopyrrolate (ROBINUL) injection 0.2 mg  0.2 mg Intravenous Q4H PRN Karmen Bongo, MD   0.2 mg at 05/23/20 2335  . haloperidol (HALDOL) tablet 0.5 mg  0.5 mg Oral Q4H PRN Karmen Bongo, MD       Or  . haloperidol (HALDOL) 2 MG/ML solution 0.5 mg  0.5 mg Sublingual Q4H PRN Karmen Bongo, MD       Or  . haloperidol lactate (HALDOL) injection 0.5 mg  0.5 mg Intravenous Q4H PRN Karmen Bongo, MD      . LORazepam (ATIVAN) tablet 1 mg  1 mg Oral Q4H PRN Karmen Bongo, MD       Or  . LORazepam (ATIVAN) 2 MG/ML concentrated solution 1 mg  1 mg Sublingual Q4H PRN Karmen Bongo, MD       Or  . LORazepam (ATIVAN) injection 1 mg  1 mg Intravenous Q4H PRN Karmen Bongo, MD      . morphine 4 MG/ML injection 4 mg  4 mg Intravenous Q2H PRN Karmen Bongo, MD   4 mg at 05/24/20 1042  . ondansetron (ZOFRAN) tablet 4 mg  4 mg Oral Q6H PRN Karmen Bongo, MD       Or  . ondansetron [facility]) injection 4 mg  4 mg Intravenous Q6H PRN Karmen Bongo, MD   4 mg at 05/23/20 1522  . polyvinyl alcohol (LIQUIFILM TEARS) 1.4 % ophthalmic solution 1 drop  1 drop Both Eyes QID PRN Karmen Bongo, MD      . sodium chloride flush (NS) 0.9 % injection 3 mL  3 mL Intravenous Q12H Karmen Bongo, MD   3 mL at 05/24/20 1003     Discharge Medications: Please see discharge summary for a list of discharge medications.  Relevant Imaging Results:  Relevant Lab Results:   Additional Information [phone], MRSA  Mckinzie Saksa, Edson Snowball, RN

## 2020-05-24 NOTE — Plan of Care (Signed)
  Problem: Education: Goal: Knowledge of the prescribed therapeutic regimen will improve 05/24/2020 1930 by Camillia Herter, RN Outcome: Adequate for Discharge 05/24/2020 1019 by Julius Bowels A, RN Outcome: Progressing   Problem: Coping: Goal: Ability to identify and develop effective coping behavior will improve 05/24/2020 1930 by Camillia Herter, RN Outcome: Adequate for Discharge 05/24/2020 1019 by Camillia Herter, RN Outcome: Progressing   Problem: Clinical Measurements: Goal: Quality of life will improve 05/24/2020 1930 by Camillia Herter, RN Outcome: Adequate for Discharge 05/24/2020 1019 by Julius Bowels A, RN Outcome: Progressing   Problem: Respiratory: Goal: Verbalizations of increased ease of respirations will increase 05/24/2020 1930 by Camillia Herter, RN Outcome: Adequate for Discharge 05/24/2020 1019 by Julius Bowels A, RN Outcome: Progressing   Problem: Role Relationship: Goal: Family's ability to cope with current situation will improve 05/24/2020 1930 by Camillia Herter, RN Outcome: Adequate for Discharge 05/24/2020 1019 by Julius Bowels A, RN Outcome: Progressing Goal: Ability to verbalize concerns, feelings, and thoughts to partner or family member will improve 05/24/2020 1930 by Camillia Herter, RN Outcome: Adequate for Discharge 05/24/2020 1019 by Camillia Herter, RN Outcome: Progressing   Problem: Pain Management: Goal: Satisfaction with pain management regimen will improve 05/24/2020 1930 by Camillia Herter, RN Outcome: Adequate for Discharge 05/24/2020 1019 by Camillia Herter, RN Outcome: Progressing

## 2020-05-24 NOTE — Care Management (Addendum)
Patient from [facility] with "do not hospitalize order".  Per attending [name] MD called last evening and asked for patient to be transferred back.   Palliative had spoken to [facility] regarding [employer] [facility].   NCM spoke to [facility] with [facility] they do not have a bed available. They can follow the patient at [facility].    NCM called left a message for Admissions at [facility] and for [facility]. Awaiting call backs.   1300 NCM spoke to [place] at [facility]. Patient can return to [facility] today. Patient is at [facility] under Medicaid ( no insurance auth needed). Helene Kelp will do a referral to hospice. Peggy can continue to visit [place] at [facility].   Peggy aware of above and in agreement for Kerra to return to [facility] today. Peggy aware patient will be transported by ambulance.   Perrin Smack will call NCM back with number for nurse to call report.    PTAR paperwork work in chart will call once [place] calls back with report number and report is called.  Nurse to call report to [phone] and ask for 100 hall nurse.   PTAR called.  Magdalen Spatz RN

## 2020-05-26 DIAGNOSIS — E785 Hyperlipidemia, unspecified: Secondary | ICD-10-CM

## 2020-05-26 DIAGNOSIS — F419 Anxiety disorder, unspecified: Secondary | ICD-10-CM | POA: Diagnosis not present

## 2020-05-26 DIAGNOSIS — J449 Chronic obstructive pulmonary disease, unspecified: Secondary | ICD-10-CM | POA: Diagnosis not present

## 2020-05-26 DIAGNOSIS — R1310 Dysphagia, unspecified: Secondary | ICD-10-CM

## 2020-05-26 DIAGNOSIS — Z9981 Dependence on supplemental oxygen: Secondary | ICD-10-CM

## 2020-05-26 DIAGNOSIS — Z66 Do not resuscitate: Secondary | ICD-10-CM

## 2020-05-26 DIAGNOSIS — I110 Hypertensive heart disease with heart failure: Secondary | ICD-10-CM

## 2020-05-26 DIAGNOSIS — I5032 Chronic diastolic (congestive) heart failure: Secondary | ICD-10-CM

## 2020-05-26 LAB — URINE CULTURE: Culture: 20000 — AB

## 2020-05-28 ENCOUNTER — Encounter: Payer: Self-pay | Admitting: Internal Medicine

## 2020-05-28 ENCOUNTER — Non-Acute Institutional Stay (SKILLED_NURSING_FACILITY): Payer: Medicare Other | Admitting: Internal Medicine

## 2020-05-28 DIAGNOSIS — R627 Adult failure to thrive: Secondary | ICD-10-CM | POA: Diagnosis not present

## 2020-05-28 DIAGNOSIS — F1129 Opioid dependence with unspecified opioid-induced disorder: Secondary | ICD-10-CM

## 2020-05-28 DIAGNOSIS — A419 Sepsis, unspecified organism: Secondary | ICD-10-CM

## 2020-05-28 DIAGNOSIS — R6521 Severe sepsis with septic shock: Secondary | ICD-10-CM

## 2020-05-28 LAB — CULTURE, BLOOD (SINGLE): Culture: NO GROWTH

## 2020-05-28 NOTE — Patient Instructions (Signed)
See assessment and plan under each diagnosis in the problem list and acutely for this visit [facility] has assumed role of PCP.

## 2020-05-28 NOTE — Assessment & Plan Note (Signed)
Hospice will be in charge of ordering all medications including all opiates, antipsychotics and tranquilizers

## 2020-05-28 NOTE — Assessment & Plan Note (Addendum)
Hospice consulted and DNR in place.  [facility] will assume full care.
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
## 2020-05-29 NOTE — Assessment & Plan Note (Signed)
Recurrent aspiration pneumonia expected as terminal event.

## 2020-06-26 DEATH — deceased

## 2023-01-15 IMAGING — CT CT ANGIO CHEST
2 of 6 series · 18 of 36 positions shown · IV contrast (omnipaque)
Comparison: Chest CT dated 07/03/2019.

CLINICAL DATA: 74-year-old female with hypoxia.

EXAM:
CT ANGIOGRAPHY CHEST WITH CONTRAST
TECHNIQUE: Multidetector CT imaging of the chest was performed using the
standard protocol during bolus administration of intravenous
contrast. Multiplanar CT image reconstructions and MIPs were
obtained to evaluate the vascular anatomy.
CONTRAST:  60mL OMNIPAQUE IOHEXOL 350 MG/ML SOLN

[Series 7: pe thins · axial · 0.61mm/px · z∈[-234,-6]mm · 17 of 363 slices shown]
[im 19/363  lung]
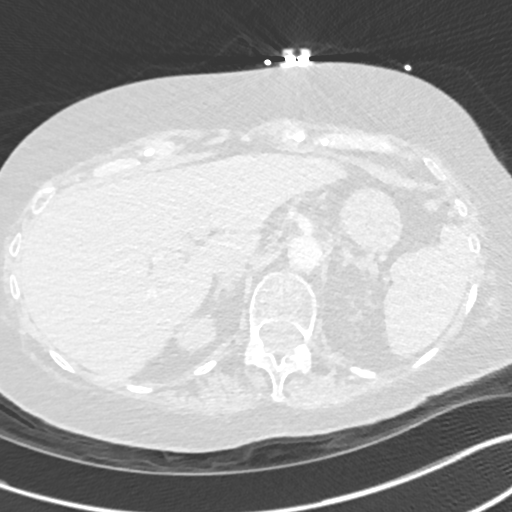
[im 37/363  mediastinal]
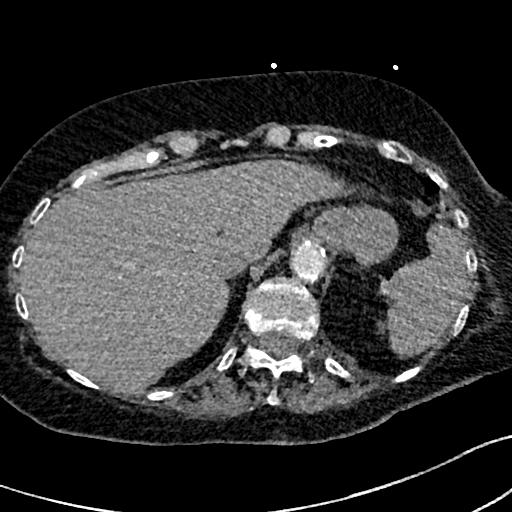
[im 55/363  lung]
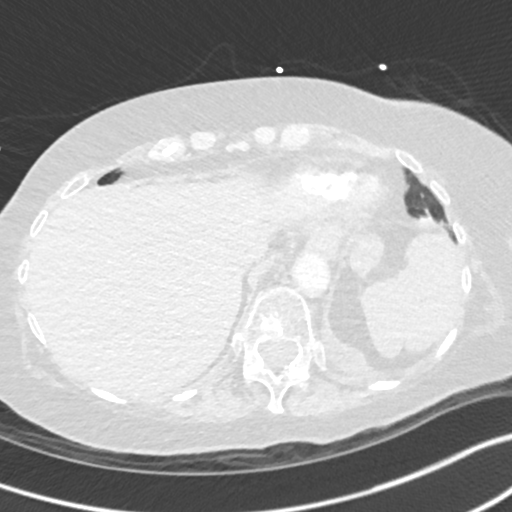
[im 73/363  mediastinal]
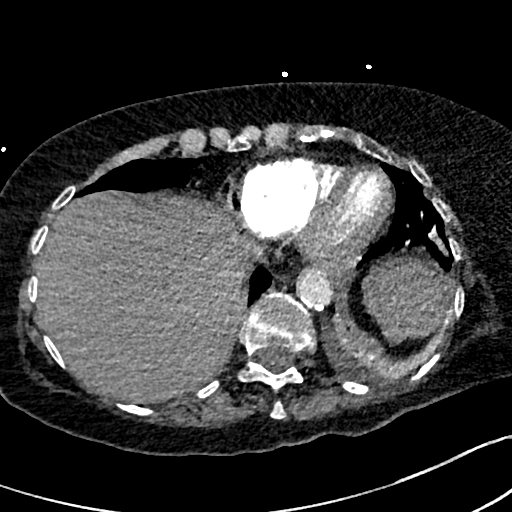
[im 109/363  lung]
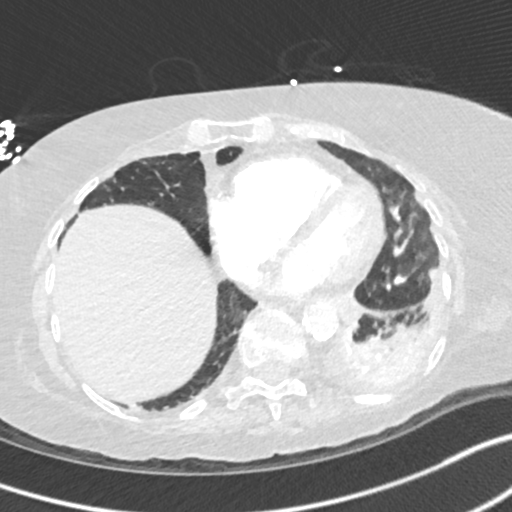
[im 127/363  mediastinal]
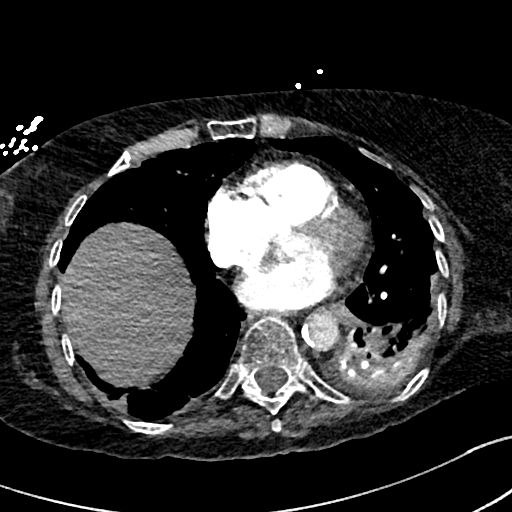
[im 145/363  lung]
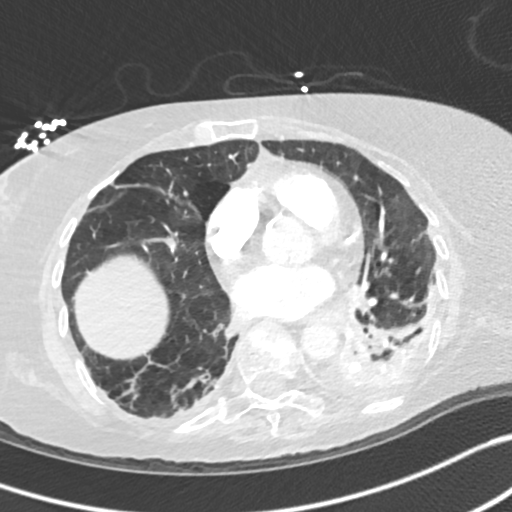
[im 163/363  mediastinal]
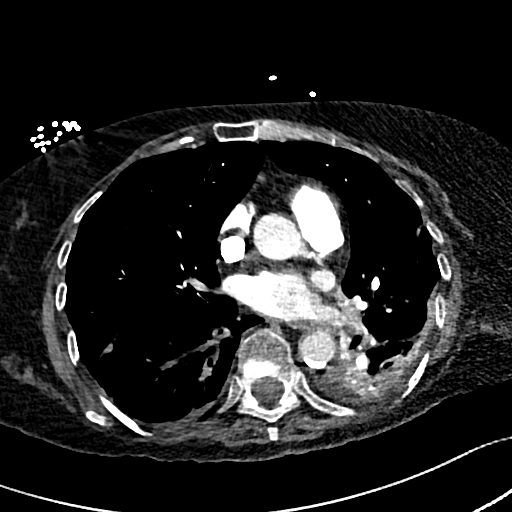
[im 182/363  lung]
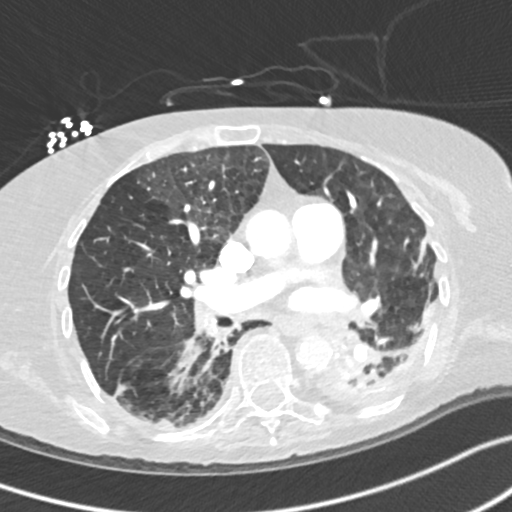
[im 200/363  mediastinal]
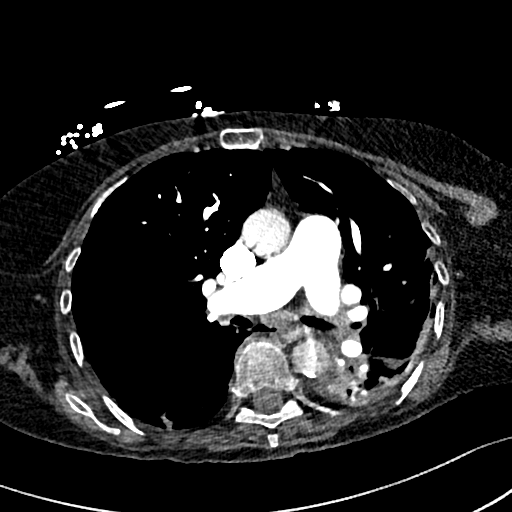
[im 218/363  lung]
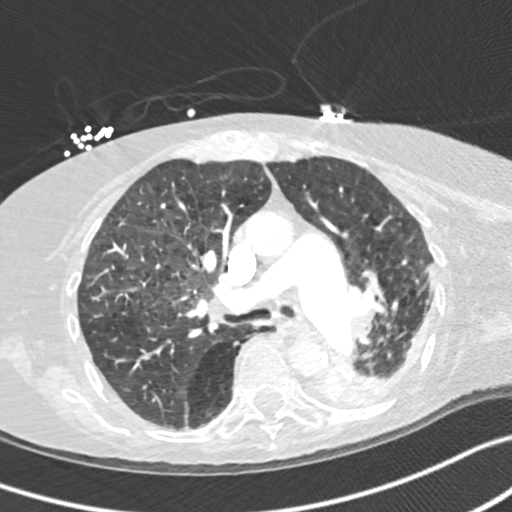
[im 236/363  mediastinal]
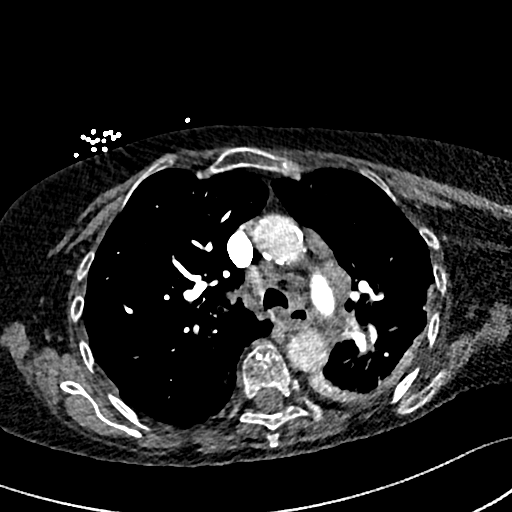
[im 254/363  lung]
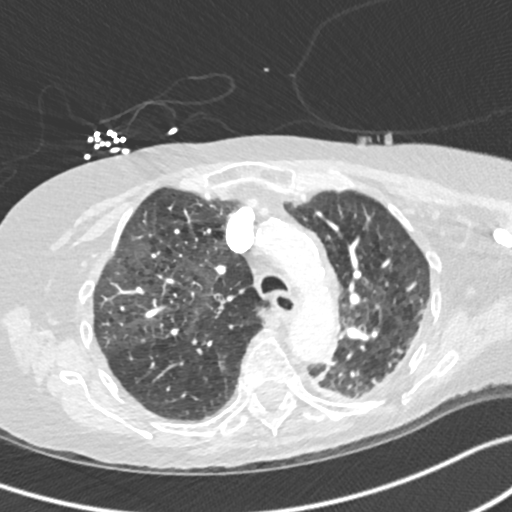
[im 290/363  mediastinal]
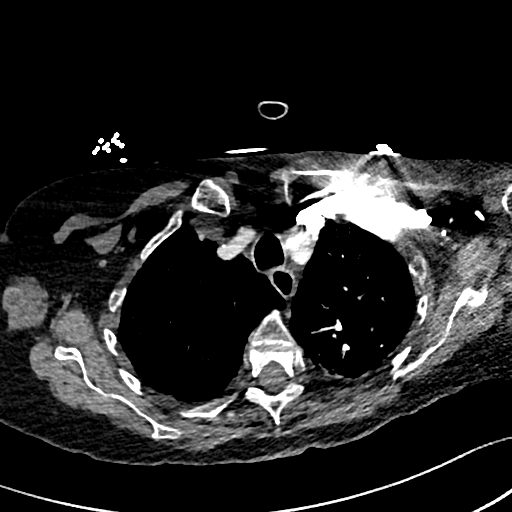
[im 308/363  lung]
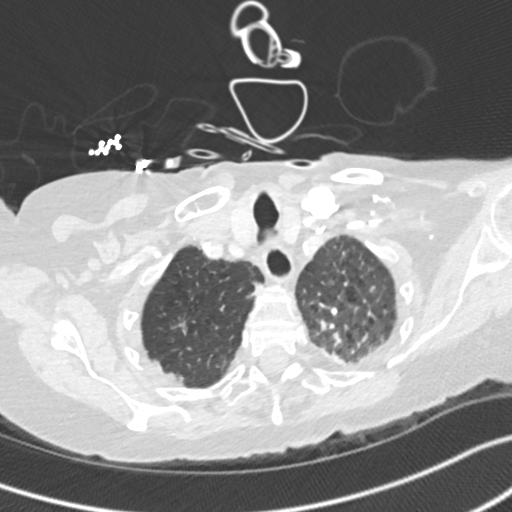
[im 326/363  mediastinal]
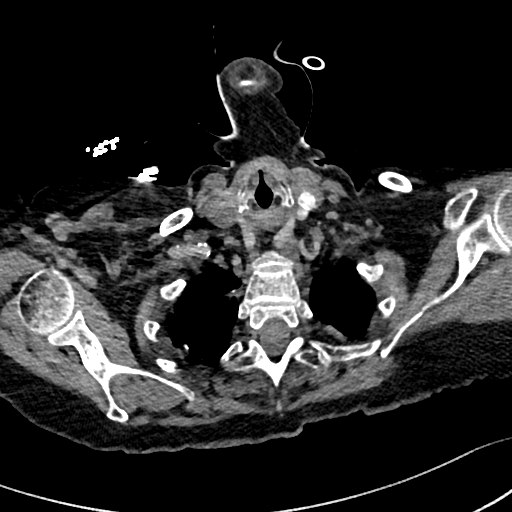
[im 344/363  lung]
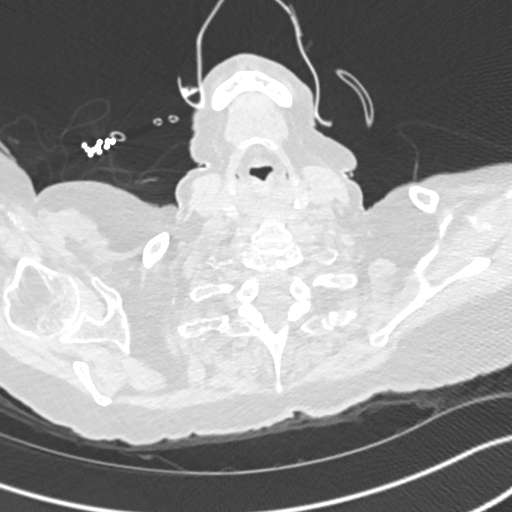

[Series 8: pe 2mm cor · coronal · 0.54mm/px · 1 of 106 slices shown]
[im 53/106  mediastinal]
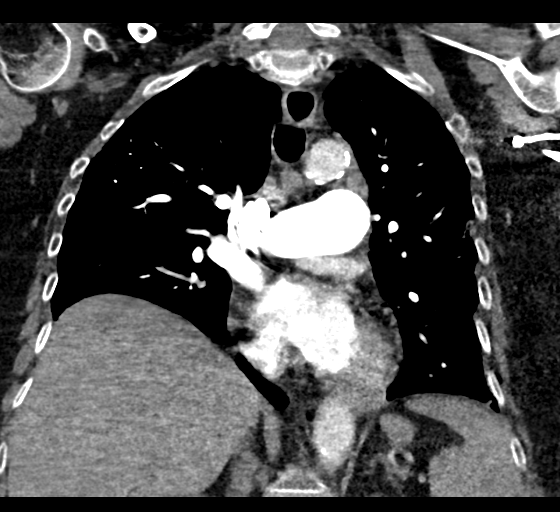

[18 of 36 positions shown; findings below may reference images not displayed]

FINDINGS: Evaluation is limited due to streak artifact caused by patient's
arms.

Cardiovascular: There is no cardiomegaly or pericardial effusion.
There is coronary vascular calcification of the LAD. Moderate
atherosclerotic calcification of the thoracic aorta. No aneurysmal
dilatation. There is dilatation of the main pulmonary trunk
suggestive of pulmonary hypertension. No pulmonary artery embolus
identified.

Mediastinum/Nodes: Enlarged hilar and mediastinal lymph nodes
measuring 17 mm in the right hilum, and 15 mm in short axis in the
prevascular space. The esophagus and the thyroid gland are grossly
unremarkable. No mediastinal fluid collection.

Lungs/Pleura: Background of emphysema. There is consolidative
changes of the majority of the left lower lobe with overall volume
loss. There is high-grade narrowing of the left lower lobe bronchus
which may be related to mucous impaction/aspiration. A centrally
occlusive lesion is not excluded. There is diffuse peribronchial
thickening. There is progression of peribronchial thickening of the
right lower lobe/right infrahilar region with narrowing of the right
lower lobe bronchus. Scattered nodularity in the left upper lobe and
lingula may be chronic or represent atypical infection. Clinical
correlation and follow-up to resolution recommended. Small left
pleural effusion. No pneumothorax.

Upper Abdomen: No acute abnormality.

Musculoskeletal: Osteopenia with multilevel chronic appearing
compression fracture and thoracic kyphosis. No acute osseous
pathology.

Review of the MIP images confirms the above findings.
IMPRESSION: 1. No CT evidence of pulmonary artery embolus.
2. Interval progression of peribronchial thickening of the right
lower lobe/right infrahilar region with narrowing of the right lower
lobe bronchus.
3. Consolidative changes of the majority of the left lower lobe
concerning for postobstructive atelectasis or infiltrate. There is
high-grade narrowing of the left lower lobe bronchus which may be
related to mucous impaction/aspiration. A centrally occlusive lesion
is not excluded. Clinical correlation and follow-up to resolution
recommended.
4. Scattered nodularity in the left upper lobe and lingula may
represent atypical infection. Clinical correlation and follow-up to
resolution recommended.
5. Small left pleural effusion.
6. Mediastinal and hilar adenopathy.
7. Aortic Atherosclerosis (XE9DI-2ON.N) and Emphysema (XE9DI-QXP.E).

## 2023-01-19 IMAGING — DX DG CHEST 1V
1 series · 1 of 1 positions shown · non-contrast
Comparison: 04/14/2020

CLINICAL DATA: Shortness of breath

EXAM:
CHEST  1 VIEW

[chest ap]
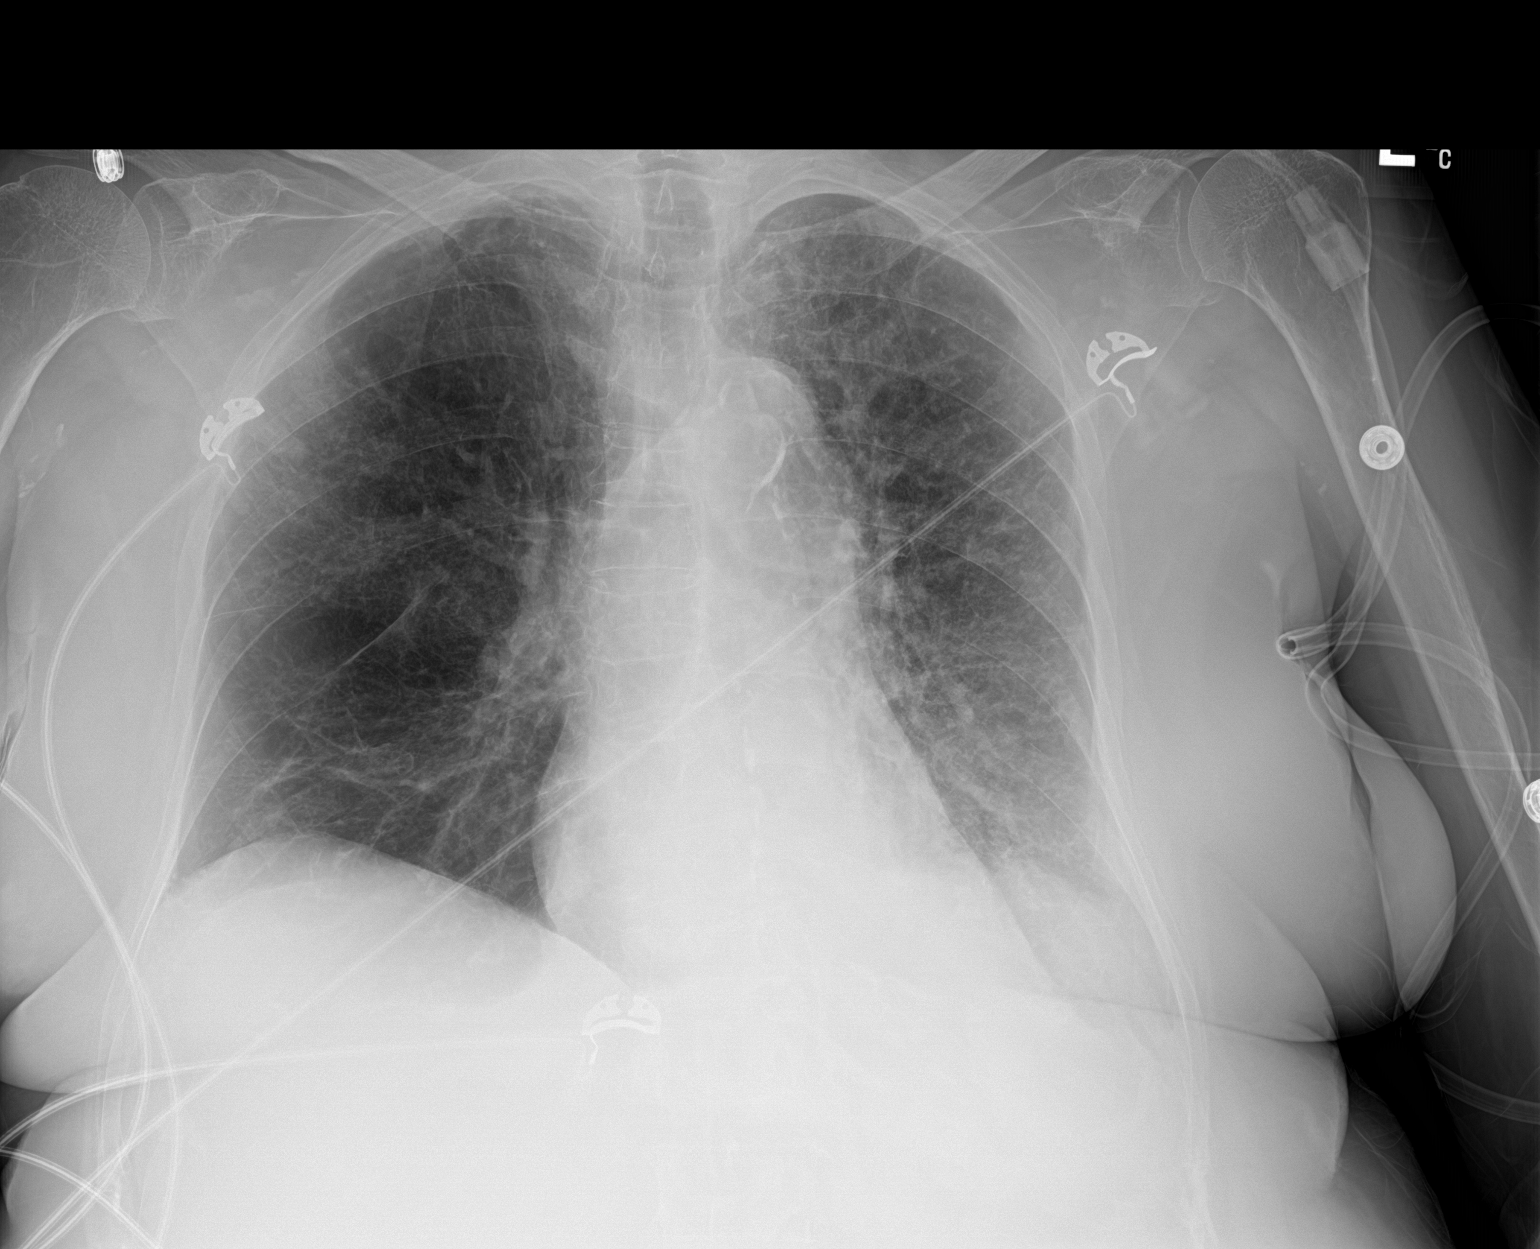

[1 of 1 positions shown; findings below may reference images not displayed]

FINDINGS: Cardiac shadow is stable. Aortic calcifications are again seen.
Significantly improved aeration in the left lung is noted with
decrease in pleural fluid and only minimal residual infiltrate. No
new infiltrate is seen. Scarring is noted bilaterally.
IMPRESSION: Significant improved aeration in the left lung when compared with
the previous exam. Only minimal residual infiltrate remains.
